# Patient Record
Sex: Male | Born: 1935
Health system: Southern US, Community
[De-identification: ages and names within clinical notes are randomized; demographics above are authoritative.]

## PROBLEM LIST (undated history)

## (undated) DIAGNOSIS — N183 Chronic kidney disease, stage 3 unspecified: Secondary | ICD-10-CM

## (undated) DIAGNOSIS — M1990 Unspecified osteoarthritis, unspecified site: Secondary | ICD-10-CM

## (undated) DIAGNOSIS — J42 Unspecified chronic bronchitis: Secondary | ICD-10-CM

## (undated) DIAGNOSIS — I4821 Permanent atrial fibrillation: Secondary | ICD-10-CM

## (undated) DIAGNOSIS — E785 Hyperlipidemia, unspecified: Secondary | ICD-10-CM

## (undated) DIAGNOSIS — K219 Gastro-esophageal reflux disease without esophagitis: Secondary | ICD-10-CM

## (undated) DIAGNOSIS — I509 Heart failure, unspecified: Secondary | ICD-10-CM

## (undated) DIAGNOSIS — I428 Other cardiomyopathies: Secondary | ICD-10-CM

## (undated) DIAGNOSIS — G4733 Obstructive sleep apnea (adult) (pediatric): Secondary | ICD-10-CM

## (undated) DIAGNOSIS — Z9989 Dependence on other enabling machines and devices: Secondary | ICD-10-CM

## (undated) DIAGNOSIS — Z9289 Personal history of other medical treatment: Secondary | ICD-10-CM

## (undated) DIAGNOSIS — I272 Pulmonary hypertension, unspecified: Secondary | ICD-10-CM

## (undated) DIAGNOSIS — I2510 Atherosclerotic heart disease of native coronary artery without angina pectoris: Secondary | ICD-10-CM

## (undated) DIAGNOSIS — K5792 Diverticulitis of intestine, part unspecified, without perforation or abscess without bleeding: Secondary | ICD-10-CM

## (undated) DIAGNOSIS — I10 Essential (primary) hypertension: Secondary | ICD-10-CM

## (undated) DIAGNOSIS — Z950 Presence of cardiac pacemaker: Secondary | ICD-10-CM

## (undated) DIAGNOSIS — G2581 Restless legs syndrome: Secondary | ICD-10-CM

## (undated) DIAGNOSIS — D46Z Other myelodysplastic syndromes: Principal | ICD-10-CM

## (undated) DIAGNOSIS — M109 Gout, unspecified: Secondary | ICD-10-CM

## (undated) DIAGNOSIS — R0600 Dyspnea, unspecified: Secondary | ICD-10-CM

## (undated) DIAGNOSIS — I219 Acute myocardial infarction, unspecified: Secondary | ICD-10-CM

## (undated) HISTORY — PX: INSERT / REPLACE / REMOVE PACEMAKER: SUR710

## (undated) HISTORY — DX: Other cardiomyopathies: I42.8

## (undated) HISTORY — PX: CATARACT EXTRACTION W/ INTRAOCULAR LENS  IMPLANT, BILATERAL: SHX1307

## (undated) HISTORY — DX: Permanent atrial fibrillation: I48.21

## (undated) HISTORY — PX: SHOULDER SURGERY: SHX246

## (undated) HISTORY — DX: Hyperlipidemia, unspecified: E78.5

## (undated) HISTORY — DX: Obstructive sleep apnea (adult) (pediatric): G47.33

## (undated) HISTORY — PX: JOINT REPLACEMENT: SHX530

## (undated) HISTORY — PX: MASS EXCISION: SHX2000

## (undated) HISTORY — DX: Pulmonary hypertension, unspecified: I27.20

## (undated) HISTORY — DX: Dependence on other enabling machines and devices: Z99.89

## (undated) HISTORY — PX: TONSILLECTOMY: SUR1361

## (undated) HISTORY — DX: Other myelodysplastic syndromes: D46.Z

---

## 1999-02-16 ENCOUNTER — Inpatient Hospital Stay (HOSPITAL_COMMUNITY): Admission: RE | Admit: 1999-02-16 | Discharge: 1999-02-20 | Payer: Self-pay | Admitting: Orthopaedic Surgery

## 1999-02-26 ENCOUNTER — Ambulatory Visit (HOSPITAL_COMMUNITY): Admission: RE | Admit: 1999-02-26 | Discharge: 1999-02-26 | Payer: Self-pay | Admitting: Orthopaedic Surgery

## 1999-03-23 ENCOUNTER — Encounter: Admission: RE | Admit: 1999-03-23 | Discharge: 1999-04-24 | Payer: Self-pay | Admitting: Orthopaedic Surgery

## 1999-06-01 ENCOUNTER — Encounter: Payer: Self-pay | Admitting: Emergency Medicine

## 1999-06-01 ENCOUNTER — Inpatient Hospital Stay (HOSPITAL_COMMUNITY): Admission: EM | Admit: 1999-06-01 | Discharge: 1999-06-03 | Payer: Self-pay | Admitting: Emergency Medicine

## 1999-06-01 HISTORY — PX: CORONARY ANGIOPLASTY: SHX604

## 2000-12-07 HISTORY — PX: APPENDECTOMY: SHX54

## 2000-12-07 HISTORY — PX: COLOSTOMY: SHX63

## 2000-12-19 ENCOUNTER — Encounter: Payer: Self-pay | Admitting: Emergency Medicine

## 2000-12-19 ENCOUNTER — Encounter (INDEPENDENT_AMBULATORY_CARE_PROVIDER_SITE_OTHER): Payer: Self-pay | Admitting: Specialist

## 2000-12-20 ENCOUNTER — Inpatient Hospital Stay (HOSPITAL_COMMUNITY): Admission: EM | Admit: 2000-12-20 | Discharge: 2001-01-12 | Payer: Self-pay | Admitting: Emergency Medicine

## 2000-12-20 ENCOUNTER — Encounter: Payer: Self-pay | Admitting: Emergency Medicine

## 2000-12-22 ENCOUNTER — Encounter: Payer: Self-pay | Admitting: General Surgery

## 2000-12-26 ENCOUNTER — Encounter: Payer: Self-pay | Admitting: General Surgery

## 2000-12-28 ENCOUNTER — Encounter: Payer: Self-pay | Admitting: General Surgery

## 2001-01-03 ENCOUNTER — Encounter: Payer: Self-pay | Admitting: General Surgery

## 2001-01-06 ENCOUNTER — Encounter: Payer: Self-pay | Admitting: General Surgery

## 2001-06-19 ENCOUNTER — Ambulatory Visit (HOSPITAL_COMMUNITY): Admission: RE | Admit: 2001-06-19 | Discharge: 2001-06-19 | Payer: Self-pay | Admitting: Gastroenterology

## 2001-06-19 ENCOUNTER — Encounter (INDEPENDENT_AMBULATORY_CARE_PROVIDER_SITE_OTHER): Payer: Self-pay | Admitting: Specialist

## 2001-07-09 HISTORY — PX: COLOSTOMY REVERSAL: SHX5782

## 2001-07-28 ENCOUNTER — Encounter: Payer: Self-pay | Admitting: General Surgery

## 2001-07-31 ENCOUNTER — Encounter (INDEPENDENT_AMBULATORY_CARE_PROVIDER_SITE_OTHER): Payer: Self-pay | Admitting: Specialist

## 2001-07-31 ENCOUNTER — Inpatient Hospital Stay (HOSPITAL_COMMUNITY): Admission: RE | Admit: 2001-07-31 | Discharge: 2001-08-05 | Payer: Self-pay | Admitting: General Surgery

## 2004-06-08 ENCOUNTER — Ambulatory Visit (HOSPITAL_COMMUNITY): Admission: RE | Admit: 2004-06-08 | Discharge: 2004-06-08 | Payer: Self-pay | Admitting: Orthopedic Surgery

## 2004-08-13 ENCOUNTER — Emergency Department (HOSPITAL_COMMUNITY): Admission: EM | Admit: 2004-08-13 | Discharge: 2004-08-13 | Payer: Self-pay | Admitting: Emergency Medicine

## 2004-08-27 ENCOUNTER — Ambulatory Visit (HOSPITAL_BASED_OUTPATIENT_CLINIC_OR_DEPARTMENT_OTHER): Admission: RE | Admit: 2004-08-27 | Discharge: 2004-08-27 | Payer: Self-pay | Admitting: Orthopedic Surgery

## 2004-08-27 ENCOUNTER — Encounter (INDEPENDENT_AMBULATORY_CARE_PROVIDER_SITE_OTHER): Payer: Self-pay | Admitting: *Deleted

## 2004-08-27 ENCOUNTER — Ambulatory Visit (HOSPITAL_COMMUNITY): Admission: RE | Admit: 2004-08-27 | Discharge: 2004-08-27 | Payer: Self-pay | Admitting: Orthopedic Surgery

## 2004-09-09 HISTORY — PX: ROTATOR CUFF REPAIR: SHX139

## 2004-09-18 ENCOUNTER — Inpatient Hospital Stay (HOSPITAL_COMMUNITY): Admission: RE | Admit: 2004-09-18 | Discharge: 2004-09-19 | Payer: Self-pay | Admitting: Orthopedic Surgery

## 2005-01-14 ENCOUNTER — Inpatient Hospital Stay (HOSPITAL_COMMUNITY): Admission: AD | Admit: 2005-01-14 | Discharge: 2005-01-15 | Payer: Self-pay | Admitting: *Deleted

## 2005-12-07 HISTORY — PX: CIRCUMCISION: SUR203

## 2005-12-15 ENCOUNTER — Ambulatory Visit (HOSPITAL_BASED_OUTPATIENT_CLINIC_OR_DEPARTMENT_OTHER): Admission: RE | Admit: 2005-12-15 | Discharge: 2005-12-15 | Payer: Self-pay | Admitting: Urology

## 2006-01-25 ENCOUNTER — Encounter: Admission: RE | Admit: 2006-01-25 | Discharge: 2006-01-25 | Payer: Self-pay | Admitting: Gastroenterology

## 2006-06-26 ENCOUNTER — Inpatient Hospital Stay (HOSPITAL_COMMUNITY): Admission: EM | Admit: 2006-06-26 | Discharge: 2006-06-27 | Payer: Self-pay | Admitting: Emergency Medicine

## 2006-11-08 HISTORY — PX: REPLACEMENT TOTAL KNEE: SUR1224

## 2006-11-30 ENCOUNTER — Inpatient Hospital Stay (HOSPITAL_COMMUNITY): Admission: RE | Admit: 2006-11-30 | Discharge: 2006-12-03 | Payer: Self-pay | Admitting: Orthopedic Surgery

## 2007-01-23 ENCOUNTER — Ambulatory Visit (HOSPITAL_COMMUNITY): Admission: RE | Admit: 2007-01-23 | Discharge: 2007-01-23 | Payer: Self-pay | Admitting: Orthopedic Surgery

## 2007-07-23 ENCOUNTER — Encounter: Admission: RE | Admit: 2007-07-23 | Discharge: 2007-07-23 | Payer: Self-pay | Admitting: Orthopedic Surgery

## 2007-11-24 HISTORY — PX: NM MYOCAR PERF WALL MOTION: HXRAD629

## 2008-11-04 ENCOUNTER — Inpatient Hospital Stay (HOSPITAL_COMMUNITY): Admission: AD | Admit: 2008-11-04 | Discharge: 2008-11-08 | Payer: Self-pay | Admitting: Cardiology

## 2008-11-07 HISTORY — PX: CARDIAC CATHETERIZATION: SHX172

## 2009-10-31 ENCOUNTER — Encounter: Admission: RE | Admit: 2009-10-31 | Discharge: 2009-10-31 | Payer: Self-pay | Admitting: Gastroenterology

## 2009-11-04 ENCOUNTER — Ambulatory Visit (HOSPITAL_COMMUNITY): Admission: RE | Admit: 2009-11-04 | Discharge: 2009-11-04 | Payer: Self-pay | Admitting: Gastroenterology

## 2009-11-07 ENCOUNTER — Ambulatory Visit (HOSPITAL_COMMUNITY): Admission: RE | Admit: 2009-11-07 | Discharge: 2009-11-07 | Payer: Self-pay | Admitting: Gastroenterology

## 2010-11-01 LAB — TYPE AND SCREEN: ABO/RH(D): A NEG

## 2010-11-01 LAB — PROTIME-INR: INR: 3.6 — ABNORMAL HIGH (ref 0.00–1.49)

## 2010-11-18 LAB — POCT I-STAT 3, ART BLOOD GAS (G3+)
pCO2 arterial: 44 mmHg (ref 35.0–45.0)
pH, Arterial: 7.378 (ref 7.350–7.450)
pO2, Arterial: 124 mmHg — ABNORMAL HIGH (ref 80.0–100.0)

## 2010-11-18 LAB — CBC
HCT: 40.2 % (ref 39.0–52.0)
Hemoglobin: 13.9 g/dL (ref 13.0–17.0)
MCHC: 34.5 g/dL (ref 30.0–36.0)
MCV: 95.7 fL (ref 78.0–100.0)
Platelets: 192 10*3/uL (ref 150–400)
RBC: 4.63 MIL/uL (ref 4.22–5.81)
RDW: 14.8 % (ref 11.5–15.5)
WBC: 8.2 10*3/uL (ref 4.0–10.5)

## 2010-11-18 LAB — BASIC METABOLIC PANEL
BUN: 24 mg/dL — ABNORMAL HIGH (ref 6–23)
CO2: 26 mEq/L (ref 19–32)
Calcium: 9.6 mg/dL (ref 8.4–10.5)
Chloride: 108 mEq/L (ref 96–112)
Creatinine, Ser: 1.22 mg/dL (ref 0.4–1.5)
GFR calc Af Amer: >60 mL/min (ref >60)
GFR calc non Af Amer: >60 mL/min (ref >60)
Glucose, Bld: 80 mg/dL (ref 70–99)
Potassium: 4.7 mEq/L (ref 3.5–5.1)
Sodium: 140 mEq/L (ref 135–145)

## 2010-11-18 LAB — POCT I-STAT 3, VENOUS BLOOD GAS (G3P V)
pCO2, Ven: 53.9 mmHg — ABNORMAL HIGH (ref 45.0–50.0)
pH, Ven: 7.271 (ref 7.250–7.300)

## 2010-11-18 LAB — PROTIME-INR: Prothrombin Time: 20.3 seconds — ABNORMAL HIGH (ref 11.6–15.2)

## 2010-11-18 LAB — HEPARIN LEVEL (UNFRACTIONATED): Heparin Unfractionated: 0.1 IU/mL — ABNORMAL LOW (ref 0.30–0.70)

## 2010-11-19 LAB — PROTIME-INR
INR: 2.3 — ABNORMAL HIGH (ref 0.00–1.49)
Prothrombin Time: 26.7 seconds — ABNORMAL HIGH (ref 11.6–15.2)

## 2010-11-19 LAB — COMPREHENSIVE METABOLIC PANEL
ALT: 22 U/L (ref 0–53)
AST: 27 U/L (ref 0–37)
CO2: 24 mEq/L (ref 19–32)
Chloride: 107 mEq/L (ref 96–112)
Creatinine, Ser: 0.94 mg/dL (ref 0.4–1.5)
GFR calc Af Amer: >60 mL/min (ref >60)
GFR calc non Af Amer: >60 mL/min (ref >60)
Glucose, Bld: 127 mg/dL — ABNORMAL HIGH (ref 70–99)
Sodium: 140 mEq/L (ref 135–145)
Total Bilirubin: 0.6 mg/dL (ref 0.3–1.2)

## 2010-11-19 LAB — CK TOTAL AND CKMB (NOT AT ARMC)
CK, MB: 3.9 ng/mL (ref 0.3–4.0)
Relative Index: 2.2 (ref 0.0–2.5)
Total CK: 158 U/L (ref 7–232)

## 2010-11-19 LAB — BASIC METABOLIC PANEL
BUN: 17 mg/dL (ref 6–23)
Calcium: 9.2 mg/dL (ref 8.4–10.5)
Calcium: 9.4 mg/dL (ref 8.4–10.5)
Chloride: 101 mEq/L (ref 96–112)
Creatinine, Ser: 0.87 mg/dL (ref 0.4–1.5)
Creatinine, Ser: 1 mg/dL (ref 0.4–1.5)
GFR calc Af Amer: >60 mL/min (ref >60)
GFR calc non Af Amer: >60 mL/min (ref >60)
Glucose, Bld: 90 mg/dL (ref 70–99)

## 2010-11-19 LAB — CBC
HCT: 39.1 % (ref 39.0–52.0)
HCT: 40 % (ref 39.0–52.0)
MCHC: 34.1 g/dL (ref 30.0–36.0)
MCV: 94.9 fL (ref 78.0–100.0)
Platelets: 168 10*3/uL (ref 150–400)
Platelets: 182 10*3/uL (ref 150–400)
RDW: 14.2 % (ref 11.5–15.5)
WBC: 6 10*3/uL (ref 4.0–10.5)

## 2010-11-19 LAB — DIFFERENTIAL
Basophils Absolute: 0 10*3/uL (ref 0.0–0.1)
Eosinophils Absolute: 0.1 10*3/uL (ref 0.0–0.7)
Eosinophils Relative: 2 % (ref 0–5)
Neutrophils Relative %: 58 % (ref 43–77)

## 2010-11-19 LAB — LIPID PANEL
HDL: 37 mg/dL — ABNORMAL LOW (ref >39)
LDL Cholesterol: 111 mg/dL — ABNORMAL HIGH (ref 0–99)
Total CHOL/HDL Ratio: 4.7 RATIO
Triglycerides: 126 mg/dL (ref <150)
VLDL: 25 mg/dL (ref 0–40)

## 2010-11-19 LAB — TROPONIN I: Troponin I: <0.01 ng/mL (ref 0.00–0.06)

## 2010-11-19 LAB — D-DIMER, QUANTITATIVE: D-Dimer, Quant: <0.22 ug/mL-FEU (ref 0.00–0.48)

## 2010-12-22 NOTE — Discharge Summary (Signed)
[name], [name]               ACCOUNT NO.:  [id number]   MEDICAL RECORD NO.:  [id number]          PATIENT TYPE:  INP   LOCATION:  1531                         FACILITY:  [facility]   PHYSICIAN:  Ollen Gross, M.D.    DATE OF BIRTH:  09/24/1931   DATE OF ADMISSION:  11/30/2006  DATE OF DISCHARGE:  12/03/2006                               DISCHARGE SUMMARY   ADMITTING DIAGNOSES:  1. Osteoarthritis, right knee.  2. Paroxysmal atrial fibrillation.  3. Hypercholesterolemia.  4. Coronary arterial disease.  5. History of myocardial infarction.  6. History of angina.  7. Diverticulosis.  8. Benign prostatic hypertrophy.  9. History of urinary tract infection.  10.History of elevated PSA.   DISCHARGE DIAGNOSES:  1. Osteoarthritis, right knee, status post right total knee      arthroplasty.  2. Mild postoperative blood loss anemia.  3. Mild postoperative hyponatremia, improved.  4. Paroxysmal atrial fibrillation.  5. Hypercholesterolemia.  6. Coronary arterial disease.  7. History of myocardial infarction.  8. History of angina.  9. Diverticulosis.  10.Benign prostatic hypertrophy.  11.History of urinary tract infection.  12.History of elevated PSA.   PROCEDURE:  November 30, 2006, right total knee, surgeon -- Dr. Lequita Halt,  assistant -- Avel Peace PA-C, spinal anesthesia.   CONSULTS:  None.   BRIEF HISTORY:  [name] is a 77 year old male with a history of  osteoarthritis of the right knee with intractable pain who has failed  nonoperative management and now presents for total knee arthroplasty.   LABORATORY DATA:  Preop CBC showed hemoglobin of 13.6, hematocrit 39.6,  white cell count 6.5; postop hemoglobin 11.4 and drifted down to 10.4;  last noted H&H 10.0 and 29.2.  PT/PTT on admission were 13.4 and 26,  respectively, INR 1.0.  Serial pro times were followed, last noted  PT/INR 17.5 and 1.4.  Chemistry panel on admission all within normal  limits.  Serial BMETs were  followed; sodium did drop from 139 to 131 and  back up to 136.  Preop UA negative.  Blood group and type A negative.   Pelvis films, December 02, 2006:  No acute bony abnormalities, lower lumbar  spondylosis.   EKG, June 25, 2006:  Limb lead reversal, otherwise no significant  change, confirmed by Dr. Carleene Cooper,   HOSPITAL COURSE:  The patient was admitted to [facility] to  undergo a right total knee arthroplasty and also underwent a left  shoulder subacromial injection at the same time, tolerated the procedure  well, later transferred to the recovery room and orthopedic floor, given  Coumadin for DVT prophylaxis, started on PCA and p.o. analgesic for pain  control following surgery.  He started getting up out of bed on day #1.  He had a little bit of discomfort in the right thigh, but otherwise  doing pretty well.  By day #2, he had a little bit of discomfort still  in the leg and had some of groin and thigh pain, also some calf  soreness.  We did x-rays of his pelvis and also ordered Dopplers; I do  not see  the Doppler report, we have it documented in the notes that the  Doppler proved to be negative.  Although despite the pain, he initially  did well with his physical therapy.  On day #2, he was up ambulating  approximately 60 feet with his rolling walker.  Dressing was changed;  incision looked good, no signs of infection.  He was weaned off his PCA  over to p.o. medications, continued to progress with therapy and by day  #3, was up and ambulating approximately 120 feet with a rolling walker.  He was tolerating his medications and was discharged home later on December 03, 2006.   DISCHARGE PLAN:  1. The patient was discharged home on December 03, 2006.  2. Discharge diagnoses:  please see above.  3. Discharge medications:  Percocet, Robaxin, Coumadin and Trinsicon.      Recommend over-the-counter laxative of choice and stool softeners.  4. Diet:  Resume home diet.  5.  Activity:  Weightbearing as tolerated, right lower extremity.  Home      health PT and home health nursing, total knee protocol.   DISPOSITION:  Home.   CONDITION ON DISCHARGE:  Improved.      Alexzandrew L. Perkins, P.A.C.      Ollen Gross, M.D.  Electronically Signed    ALP/MEDQ  D:  01/06/2007  T:  01/07/2007  Job:  [id number]   cc:   Thereasa Solo. Little, M.D.  Fax: [phone]   Maretta Bees. Vonita Moss, M.D.  Fax: [phone]

## 2010-12-22 NOTE — Op Note (Signed)
[name], [name]               ACCOUNT NO.:  [id number]   MEDICAL RECORD NO.:  [id number]          PATIENT TYPE:  AMB   LOCATION:  DAY                          FACILITY:  [facility]   PHYSICIAN:  Ollen Gross, M.D.    DATE OF BIRTH:  1932/06/13   DATE OF PROCEDURE:  01/23/2007  DATE OF DISCHARGE:                               OPERATIVE REPORT   PREOPERATIVE DIAGNOSIS:  Arthrofibrosis, right knee.   POSTOPERATIVE DIAGNOSIS:  Arthrofibrosis, right knee.   PROCEDURE:  Right knee closed manipulation.   SURGEON:  Ollen Gross, M.D., no assistant.   ANESTHESIA:  General.   Pre-manipulation range of motion 10-85.  Post-manipulation range of  motion 5-120.   COMPLICATIONS:  None.   CONDITION:  Stable to recovery.   BRIEF CLINICAL NOTE:  [name] is a 77 year old male who had a right  total knee arthroplasty done approximately 6-8 weeks ago.  He has not  progressed very well with physical therapy and has plateaued with  flexion of 85 degrees and with a 15-degree flexion contracture.  He  presents now for closed manipulation.   PROCEDURE IN DETAIL:  After the successful administration of a general  anesthetic.  Exam under anesthesia was performed demonstrating range of  motion 10-85 degrees.  I then placed my chest on his proximal tibia,  flexing the knee with audible lysis of adhesions.  I was able to get him  flexed to 120 degrees and extended to within 5 degrees of full  extension.  I worked on patellar mobility and got the patella moving  better.  He was subsequently awakened and transported to recovery in  stable condition.      Ollen Gross, M.D.  Electronically Signed     FA/MEDQ  D:  01/23/2007  T:  01/24/2007  Job:  [id number]

## 2010-12-22 NOTE — Cardiovascular Report (Signed)
NAMEBRADFORD, [name]               ACCOUNT NO.:  [id number]   MEDICAL RECORD NO.:  [id number]          PATIENT TYPE:  INP   LOCATION:  3710                         FACILITY:  [facility]   PHYSICIAN:  Thereasa Solo. Little, M.D. DATE OF BIRTH:  [birth year]   DATE OF PROCEDURE:  11/07/2008  DATE OF DISCHARGE:                            CARDIAC CATHETERIZATION   INDICATIONS FOR TEST:  This 77 year old male has chronic atrial  fibrillation.  He has refused cardioversion and any significant workup  in the recent past but presented to my office on November 04, 2008,  complaining of worsening shortness of breath and episodes of chest pain.  He was hospitalized and his Coumadin was stopped.  He remained in atrial  fibrillation.  His INR is now down to 1.70 and he is brought to the cath  lab.  In the interim, he had pulmonary function studies that showed an  FEV-1 of 65%.  He was given nebulizer treatments which seemed to make a  substantial improvement in his breathing.  He has symptoms of  obstructive sleep apnea and I tried to get an oxygen saturation run at  night in the hospital but respiratory therapy says their equipment is  malfunctioning, this cannot be performed.   PROCEDURE:  The patient was prepped and draped in the usual sterile  fashion exposing the right groin following local anesthetic with 1%  Xylocaine, a Seldinger technique employed and a 5-French introducer  sheath was placed in the right femoral artery and a 7-French introducer  sheath in the right femoral vein.  A Swan-Ganz catheter was then  advanced through its normal route into the pulmonary artery with  hemodynamic monitoring undertaken throughout each station.  Cardiac  output by thermodilution was then performed.  A pigtail catheter was  then placed into the LV cavity for ventriculography and simultaneous  hemodynamic monitoring in the LV and pulmonary artery.  Left and right  coronary arteriography were performed and cardiac  output by  thermodilution was performed.   COMPLICATIONS:  None.   The patient was restless.  He complained of his back hurting him.  He  was given 2 mg of Versed and 25 mg of fentanyl.  With this, he slept  through the majority of the procedure, but had uncontrollable leg  movement.  This was so significant that one tech had to hold his of  right leg down the entire case.  The leg movement was exacerbated each  time he had these long apneic spells that required [place] to shake or  stimulate him.  I suspect there is significant obstructive sleep apnea  and he will need this evaluated as an outpatient.   TOTAL CONTRAST:  75 mL.   EQUIPMENT:  5-French Judkins configuration catheters.   RESULTS:  Ventriculography.  Ventriculography in the RAO projection  revealed slight dilatation of left ventricular cavity.  The ejection  fraction was 40-45%.  He was in atrial fibrillation.  His left  ventricular end-diastolic pressure was 19.   CORONARY ARTERIOGRAPHY:  1. Left main was normal and bifurcated.  2. Circumflex:  The circumflex was basically  a hybrid system.  It gave      rise to a very large ongoing circumflex.  It gave rise to a      marginal vessel at the terminal portion of this.  There were 2      medium-sized OMs that came off very proximal.  These vessels were      free of disease.  3. LAD:  The LAD extended down to the apex of the heart.  It was free      of disease.  The first diagonal was free of disease.  4. Right coronary artery:  The right coronary was a large dominant      vessel.  There was no high-grade stenosis.  There was no narrowing      in the right coronary artery.  The PDA was large, free of disease      as was posterolateral vessel.   HEMODYNAMIC MONITORING:  1. Right atrial pressure 11, right ventricular pressure 42/11,      pulmonary artery pressure 43/21, wedge 21, central aortic pressure      131/64, left ventricular pressure 128/13 with no significant  valve      gradient noted at the time of pullback.  2. Oxygen saturation on 2 L 100% in the AO and 57% in the PA.   Cardiac output by thermodilution 4.1, Fick 3.6.  Cardiac index  thermodilution 1.8, Fick 1.6.   ASSESSMENT:  1. Nonischemic cardiomyopathy with ejection fraction of 40-45%.  2. Pulmonary hypertension with pulmonary artery pressure of 43/21.  3. Chronic atrial fibrillation.  4. Moderate-to-severe obstructive sleep apnea with uncontrollable leg      movement.   My plan is to restart him on Coumadin and consider Lovenox as a bridge.  If his insurance will allow and his family can administer this, he can  be discharged to home late today or tomorrow.  Otherwise, he will be  here until his INR is therapeutic.           ______________________________  Thereasa Solo. Little, M.D.     ABL/MEDQ  D:  11/07/2008  T:  11/08/2008  Job:  [id number]   cc:   Catheterization Lab

## 2010-12-22 NOTE — Discharge Summary (Signed)
[name], [name]               ACCOUNT NO.:  [id number]   MEDICAL RECORD NO.:  [id number]          PATIENT TYPE:  INP   LOCATION:  3710                         FACILITY:  [facility]   PHYSICIAN:  Thereasa Solo. Little, M.D. DATE OF BIRTH:  20-Jan-1932   DATE OF ADMISSION:  11/04/2008  DATE OF DISCHARGE:  11/08/2008                               DISCHARGE SUMMARY   DISCHARGE DIAGNOSES:  1. Chest pain, coronary ischemia status post cardiac catheterization      with no significant obstructive coronary artery disease.  2. Shortness of breath, questionable mild congestive heart failure      versus chronic obstructive pulmonary disease, mild versus effects      from diastolic.  3. Chronic atrial fibrillation.  4. Anticoagulation, sent home on Lovenox and Coumadin.  5. Probable obstructive sleep apnea.  6. Hyperlipidemia.   LABORATORY DATA:  Blood gas, November 07, 2008, showed pO2 of 124, bicarb  25, O2 sat 99%.  PH was 7.378, pCO2 was 44.  Hemoglobin 13.9, hematocrit  40.2, WBC 6.8, and platelets 192.  Sodium 140, potassium 4.7, BUN 24,  creatinine 1.04.  CK-MBs and troponins were all negative.  His BNP on  November 04, 2008, was 464.  TSH was 1.994.  Magnesium was 2.0.  Total  cholesterol was 173, triglycerides 126, HDL 37, LDL 111.  INR on November 08, 2008, was 1.5.  D-dimer was less than 0.22.  Chest x-ray on November 04, 2008, showed cardiac enlargement without acute pulmonary process.   Tests include PFTs performed on November 05, 2008.  His FVC was 68%  predicted and FEV-1 was 78% predicted.  It was interpreted as minimal  obstructive airway disease.  Cardiac catheterization on November 07, 2008,  by Dr. [name] revealing no coronary artery .  His EF was mildly  low at 40-45%.  PA pressure was 43/21.  Cardiac output by thermo was 4.1  and cardiac index 1.8.  RV was 42/11.   HOSPITAL COURSE:  [name] came to the hospital, was actually  seen in the office.  He was worked in because of  increased dyspnea on  exertion and chest pain for 3-4 weeks and more over the last 2 weeks.  He had no cough or fever.  He was seen by Dr. Clarene Duke, and he was  admitted to the hospital.  Because of a mildly elevated BNP 464, he  received one dose of 40 mg of p.o. Lasix.  After that, he did show some  improvement; however, his Bystolic was also held.  He was also given  some breathing treatments.  He underwent cardiac catheterization.  This  did not show any significant disease, but he did have mild decrease in  his EF of 40-45%.  He also had elevated PA pressures and some pulmonary  hypertension.  He had been on Coumadin prior to hospitalization.  The  Coumadin had been held for the cath.  He was placed back on Lovenox  after cath, and his daughter who is an Char[employer] fundraiser was able to give him  injections at home, thus he  was discharged home on Lovenox to Coumadin  on November 08, 2008, in stable condition.  He had no further shortness of  breath.   DISCHARGE MEDICATIONS:  1. He was told to told his fish oil until he is off his Lovenox and      Coumadin is therapeutic.  2. Avodart 5 mg a day.  3. Trilipix 135 mg a day.  4. Flomax 0.4 mg a day.  5. Loratadine 10 mg as needed.  6. Benazepril 10 mg a day.  7. Warfarin same dose as taken at home prior.  8. Equate stool softener as needed.  9. Celexa 20 mg a day.  10.MiraLax 17 g daily in 8 ounces a water for constipation.  11.Lovenox 120 mg subcu every 12 hours until INR is therapeutic.  12.Furosemide 20 mg every other day with K-Dur 20 mEq every other day.      He should take this on the same day every other day,  13.Combivent MDI 1 puff 4 times per day.   He will follow up with Dr. Clarene Duke in the office in 1-2 weeks.  Our  office will call him with an appointment.  He was told not to do any  strenuous activity, lifting, pushing, pulling, or exercise for 1 week.  He should have his protime checked on Monday or Tuesday.  He will call  for an  appointment.      Lezlie Octave, N.P.    ______________________________  Thereasa Solo. Little, M.D.    BB/MEDQ  D:  11/08/2008  T:  11/09/2008  Job:  [id number]

## 2010-12-25 NOTE — H&P (Signed)
NAMEMARQUIZE, SEIB               ACCOUNT NO.:  [id number]   MEDICAL RECORD NO.:  [id number]          PATIENT TYPE:  INP   LOCATION:  5029                         FACILITY:  [facility]   PHYSICIAN:  Tera Mater. [place], M.D. DATE OF BIRTH:  12/05/1931   DATE OF ADMISSION:  06/25/2006  DATE OF DISCHARGE:                                HISTORY & PHYSICAL   HISTORY OF PRESENT ILLNESS:  Mr. Platte is a 77 year old white male with a  history coronary disease, extensive diverticular rupture in the past, and  hypertension who presents for evaluation.  He was in his usual state of good  health with no active complaints and awakened from a nap yesterday with  acute upper and mid abdominal pain.  He was quite uncomfortable, came to the  emergency room for evaluation.  He had no episodes of nausea and vomiting.  At that time, he had no diarrhea or bowel movements in the last 24 hours.  He has been given pain medicines and his pain is dramatically better.  His  bowels have been working normal and he has seen no blood there.  He did,  however, have a bloody stool while here in the emergency room.  He has had  no unusual food intake or travel.  He has had no chronic GI complaints and  has been doing quite well.  His weight has been clinically stable with 2-3  pounds of increase at most.  He has had no cardiac symptoms, no shortness of  breath, his right knee chronically hurts.  He has had no similar episodes in  recent memory, although it does look like, in the computer, that he  presented in June 2006, he had a somewhat similar episode.  Nobody else in  the family has been sick.  He has had no fevers, chills, or sweats.   PAST MEDICAL HISTORY:  Includes ruptured diverticulum in 2002 with extensive  abscess formation, wound problems, and a colostomy and Hartmann pouch that  was reversed.  He has had a history of colonic polyps, history of heart  disease with an MI in 2000, history of paroxysmal A-fib on  chronic Coumadin  therapy.  He has had an elevated PSA and is followed by Dr. Vonita Moss,  hyperlipidemia, hypertension, chronic proteinuria.   PAST SURGICAL HISTORY:  Includes a palmar mass repair with ulnar nerve  repair, rotator cuff tear repair, and circumcision.  He has also had a left  total knee replacement.   ALLERGIES:  1. Are to [facility], which makes him nauseous.  2. SULFA.  3. NIASPAN.   SOCIAL HISTORY:  He is married, he has not smoked in some time.   CURRENT MEDICATION LIST:  Includes Warfarin, Darvocet, Zocor, Zetia,  Avodart, and Celebrex.   PHYSICAL EXAMINATION:  Currently on exam, we have a heavy, but non toxic  appearing, white male lying quietly on a stretcher, no stress.  Blood  pressure 103/75, pulse 55, respirations 16, temperature 97.1, O2 SAT's 95%  on room air.  HEENT:  Sclera anicteric, extraocular movements intact, no nystagmus.  Oral  mucous membranes are  moist.  Face is symmetric.  NECK:  Is supple without bruits or JVD.  LUNGS:  Are clear without wheezes, rales, or rhonchi.  No accessory muscle  in use.  HEART:  Is distant and slightly irregular.  ABDOMEN:  Is large, difficult to assess, it is moderately distended,  multiple old scars are present, it is somewhat tympanic with some high  pitched sounds but really no rushes or tinkles.  He is really non tender at  the present time.  EXTREMITIES:  Show real trace of edema, pulses are relatively preserved.  NEURO:  The patient is awake, alert, maintaining perfectly well, speech is  clear, no tremors present.  Motor exam is normal.   LABORATORY DATA:  Abdominal x-ray suggests a significant small bowel  obstruction, white count is 9800, hemoglobin is 15.2, platelets 246,000, INR  is 2.3, sodium is 137, potassium 4.0, chloride 106, CO2 22, BUN 16,  creatinine 1.1.  An EKG is right axis.  He seems to be in a sinus rhythm  with incomplete right bundle branch block.  No ischemic changes are present.    ASSESSMENT/PLAN:  In summary, we have a 77 year old white male with  extensive GI surgery in the past, presenting with acute abdominal pain and  an x-ray consistent with small bowel obstruction.  It is a bit unusual that  he had a bloody stool while here.  That is not consistent with the other  data.  At the present time, he is hemodynamically stable, not significantly  febrile, and he is pain free at the moment.  We are going to bring him in,  give him some fluids, keep him at bowel rest, have GI see him and do further  evaluation, such as a CT, if clinically required.  His extensive GI history  does make mechanical obstruction more likely than in some cases.  The GI  bleeding may be unrelated.  His INR is therapeutic.  He has no evidence of  current cardiac dysfunction.           ______________________________  Tera Mater Evlyn Kanner, M.D.     SAS/MEDQ  D:  06/26/2006  T:  06/26/2006  Job:  [id number]

## 2010-12-25 NOTE — Consult Note (Signed)
[facility]  Patient:    [name], [name]                      MRN: [id number] Proc. Date: 01/09/01 Adm. Date:  [id number] Attending:  Brandy Hale CC:         Jonelle Sports. Cheryll Cockayne, M.D.  [name], M.D.  [name], M.D.  Angelia Mould. Derrell Lolling, M.D.   Consultation Report  HISTORY OF PRESENT ILLNESS:  The patient admitted with perforated diverticulitis who was managed initially medially, but when he failed to improve underwent surgical options on the 22nd.  He has improved nicely from that and his diet has been advanced; however, his liver tests have been increasing slowly and I am consulted for further workup and plans.  An ultrasound done did show a marked amount of sludge with slight gallbladder distention but the CBD was normal.  There were no reported abnormalities of the gallbladder on any of his previous CT scans.  The patient has had no history of elevated liver tests or liver or gallbladder problems before.  PAST MEDICAL HISTORY:  MI and coronary artery disease, chronic proteinuria, microscopic hematuria, hypertension, left knee replacement, tonsillectomy, and the recent abdominal surgery as mentioned above.  HOME MEDICATIONS:  1. Lopressor.  2. Lasix.  3. Nitroglycerin.  4. Lipitor.  5. Aspirin.  6. Pepcid.  HOSPITAL MEDICATIONS:  1. Phenergan.  2. Digoxin.  3. Mylanta.  4. Fluconazole.  5. Primaxin.  6. Lovenox.  7. Protonix.  8. Flomax.  9. Lopressor. 10. Demerol. 11. Insulin. 12. Benadryl. 13. ______.  FAMILY HISTORY:  Negative for any liver disease or gallbladder problems.  SOCIAL HISTORY:  Rarely drinks.  Quit smoking about 40 years ago.  REVIEW OF SYSTEMS:  Pertinent for him eating well, feeling better, wanting to go home.  PHYSICAL EXAMINATION:  GENERAL:  No acute distress.  Sitting comfortably in the chair eating.  VITAL SIGNS:  Stable.  Afebrile.  LABORATORY DATA:  Ultrasound report  reviewed.  Pertinent for the sludge but normal CBD.  CT reports reviewed as well.  Liver tests were pertinent for normal liver tests on admission, which beginning on the 30th have increased beginning with AST.  Bilirubin has increased to 3.8 but has actually been dropping since then.  ALT 178, ALP 244.  The alkaline phosphatase has increased to 569, whereas the other transaminases have stabilized in the 120-140 range.  His TPN was stopped recently, I believe yesterday.  ASSESSMENT: 1. Status post surgery for ruptured diverticula. 2. Increased liver tests with sludge in the gallbladder.  PLAN:  With the patient looking and feeling so well, afebrile and normal white count, I do not think the gallbladder bowel duct is playing a role.  I think the LFTs are probably multifactorial secondary to the TPN and antibiotics, etc.  Clinical situation:  I have discussed the above with Dr. Derrell Lolling.  We might consider HIDA scan, or even an MRCP, but would hold ERCP or liver biopsy for now, since stopping the antibiotics tomorrow.  I think following him clinically a few more days before we decide is okay.  We will follow with you. DD:  01/09/01 TD:  01/09/01 Job: [id number]

## 2010-12-25 NOTE — Op Note (Signed)
[place]. [facility]  Patient:    [name], [name] Visit Number: [id number] MRN: [id number]          Service Type: SUR Location: MICU 2107 01 Attending Physician:  Brandy Hale Dictated by:   Angelia Mould. Derrell Lolling, M.D. Proc. Date: 07/31/01 Admit Date:  07/31/2001   CC:         [name], M.D.  [name], M.D.  [name], M.D.  [name], M.D.   Operative Report  PREOPERATIVE DIAGNOSIS:  Diverticulitis, status post Hartmann resection.  POSTOPERATIVE DIAGNOSIS:  Diverticulitis, status post Hartmann resection.  PROCEDURE:  Exploratory laparotomy, resection and closure of colostomy.  SURGEON:  Angelia Mould. Derrell Lolling, M.D.  FIRST ASSISTANT:  Sandria Bales. Ezzard Standing, M.D.  OPERATIVE INDICATION:  This is a 77 year old white male who presented to this hospital on Dec 28, 2000, for complicated, ruptured acute diverticulitis.  He had multiple interloop abscesses with intense inflammation.  He underwent sigmoid colectomy with colostomy, a segmental small bowel resection, and appendectomy at that time.  Postop wound infection occurred but the healed. He had elevated liver function tests postop, possibly cholestatic changes form his hyperalimentation, and this has now resolved.  He has undergone outpatient workup and has undergone bowel prep at home and is here electively for colostomy closure.  DESCRIPTION OF PROCEDURE:  Following the induction of general endotracheal anesthesia, an orogastric tube and a Foley catheter were inserted without difficulty.  The patient was placed in the lithotomy position.  Rigid proctoscopy was carried out to about 16 or 17 cm.  He had mild friability of the rectal mucosa because of his diversion but otherwise no abnormality.  The patient was then placed supine and the abdomen was prepped and draped in a sterile fashion.  Midline laparotomy incision was made, excising the old wide scar.  Dissection was  carried down through the subcutaneous tissue and through the fascia.  We entered the abdomen in the upper abdomen and slowly opened the incision, taking down all of the adhesions atraumatically.  We dissected the omentum away from the underside of the incision, especially on the right side, and elevated the omentum and the transverse colon.  We took all the adhesions down of the small bowel.  There was no abscess or purulence anywhere, just old adhesions, and we examined the small bowel all the way from the ligament of Treitz to the ileocecal valve, also examined the ascending colon, transverse colon, splenic flexure, and descending colon down to the colostomy, and it all looked fine.  The rectal stump was mobilized out of the pelvis, and we had a reasonably good length of about 6 or 7 cm above the peritoneal reflection.  We identified the Prolene sutures in the rectal stump, elevated this up, cleaned some of the fat off, placed a Satinsky clamp on the stump of the rectum, and transected this, giving a clean, open rectal stump to sew to.  Stay sutures of silk were placed below this to keep it elevated.  We finished taking down all the adhesions.  We resected the colostomy from the left abdominal wall and brought it out.  We took down the splenic flexure partially by dividing the lateral peritoneal attachments, and this enabled [place] to bring the colon down into the true pelvis without any tension.  We were able to create an anastomosis which ultimately lay right at the sacral promontory.  We resected about 4 cm of the colostomy, taking down the mesentery  between hemostats and tying off mesenteric vessels with 2-0 silk ties.  We then placed an Allen clamp on the colon about 4-5 cm above the colostomy and transected this and sent the resected colostomy and colon segment to pathology.  This colon was pink and healthy and quite viable.  Anastomosis was created between the proximal colon, which  was the very distal descending colon, and the proximal rectum with interrupted single layer of 3-0 silk.  Corner sutures were placed and held in place.  Posterior wall was closed with a mattress suture centrally and interrupted simple sutures all the way across, and then these sutures were then tied and cut.  The corners were turned in with interrupted inverting sutures of 3-0 silk.  The anterior wall was then closed with interrupted sutures of 3-0 silk.  This provided a very secure closure with good, pink, viable bowel which bled easily on either side.  At this point we changed our gloves and instruments and suction devices.  We irrigated the abdomen and pelvis with about 4 L of saline.  We found no bleeding.  We closed a little bit of the mesentery of the colon at the anastomosis to cover up the suture line.  There was no tension on the anastomosis, and it lay quite nicely just over the sacral promontory.  We closed the fascia at the left-sided colostomy site with about six interrupted sutures of #1 Novofil.  The midline fascia was closed with running sutures of #1 Novofil, and also we placed about six or seven interrupted sutures of #1 Novofil to reinforce this.  After all these sutures were placed, we irrigated the midline wound and the colostomy site and closed the incisions with skin staples.  The colostomy site was closed fairly loosely, and we placed Telfa wicks in place.  Clean bandages were placed and the patient taken to the recovery room in stable condition.  Estimated blood loss was about 250 cc.  Complications:  None.  Sponge, needle, and instrument counts were correct. Dictated by:   Angelia Mould. Derrell Lolling, M.D. Attending Physician:  Brandy Hale DD:  07/31/01 TD:  08/01/01 Job: [phone] [id number]

## 2010-12-25 NOTE — H&P (Signed)
[place]. [facility]  Patient:    [name], [name]                      MRN: [id number] Adm. Date:  [id number] Attending:  Brandy Hale CC:         Jonelle Sports. Cheryll Cockayne, M.D.  Thereasa Solo. Little, M.D.  [name], M.D.   History and Physical  CHIEF COMPLAINT: Lower abdominal pain.  HISTORY OF PRESENT ILLNESS: This is a 77 year old white male, who noted rather sudden onset of diffuse lower abdominal pain at about 5:30 a.m. yesterday, Monday, Dec 19, 2000.  He denies fever or chills.  He denies nausea or vomiting.  He states he had a couple of reasonably formed bowel movements yesterday, no diarrhea.  He has seen no blood in his bowel movements.  He states he is voiding reasonably well.  He denies any prior GI symptoms like this.  He specifically denies prior history of appendicitis, ulcer disease, or diverticulitis.  He was evaluated by the emergency department physician and I was called to see him when CT scan suggested diverticulitis with micro perforation.  PAST MEDICAL/SURGICAL HISTORY:  1. The patient had a myocardial infarction in September 2000 and underwent     cardiac catheterization and stenting by Dr. Clarene Duke.  2. He has chronic proteinuria, followed by Dr. Camille Bal.  3. He has microscopic hematuria, followed by Dr. Larey Dresser.  4. He has borderline hypertension.  5. He has had a left total knee replacement in July 2000.  6. He has had a tonsillectomy.  CURRENT MEDICATIONS:  1. Lopressor q.d, dose unknown.  2. Lasix 40 mg q.d.  3. Pepcid q.d.  4. Isosorbide 10 mg q.d.  5. Aspirin one q.d.  6. Aleve.  7. Lipitor.  ALLERGIES: VICODIN.  SOCIAL HISTORY: The patient is retired.  He is married and has five children. 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  He drinks alcohol rarely and states he quit smoking cigarettes about 40 years ago.  FAMILY HISTORY: Father died of a stroke.  Mother died of unknown cause.   One brother has diabetes.  REVIEW OF SYSTEMS: All systems are reviewed and are unremarkable except as described above.  PHYSICAL EXAMINATION:  GENERAL: Overweight white gentleman, in moderate distress.  He is cooperative and friendly.  VITAL SIGNS: Oxygen saturation 92% on room air.  Blood pressure 136/59, heart rate 79 and regular, respiratory rate 24, temperature 98.6 degrees.  HEENT: Sclerae clear.  EOMI.  Oropharynx clear.  He has upper and lower dentures.  NECK: Supple, nontender; no masses, no thyromegaly, no adenopathy, no bruits.  LUNGS: Clear to auscultation.  HEART: Regular rate and rhythm, no murmur.  ABDOMEN: Obese, bowel sounds diminished; diffuse tenderness and guarding, but this is most noticeable in the bilateral lower quadrants.  No palpable mass. He is significantly tender, however.  GU: Normal male genitalia pattern.  Penis, scrotum, and testes look normal.  EXTREMITIES: No edema, good pulses.  NEUROLOGIC: Grossly within normal limits.  LABORATORY DATA: Admission WBC 15,400.  Urinalysis shows 5-10 rbc/hpf. Complete metabolic panel normal.  EKG pending.  CT scan shows what appears to be a thickened sigmoid colon with numerous diverticula and surrounding inflammatory changes in the mesentery; few tiny air bubbles suggesting micro perforation; no abscess; no large amount of free air.  IMPRESSION:  1. Acute diverticulitis with micro perforation.  CT scan findings show a     fairly localized process because  abdominal examination is more worrisome.  2. Coronary artery disease, status post myocardial infarction; stable.  3. Hypertension.  PLAN: The patient will be admitted for initial nonoperative management and will be kept NPO and placed on IV Zosyn.  He is advised we are going to attempt nonoperative management but that if he deteriorates he will possibly need exploratory laparotomy and colectomy and colostomy to rectify the situation.  If he responds  to antibiotics then he can have an elective work-up later. DD:  12/20/00 TD:  12/20/00 Job: [id number]

## 2010-12-25 NOTE — Consult Note (Signed)
[name], [name] NO.:  [id number]   MEDICAL RECORD NO.:  [id number]          PATIENT TYPE:  OBV   LOCATION:  0450                         FACILITY:  [facility]   PHYSICIAN:  [name]., M.D.DATE OF BIRTH:  July 11, 1932   DATE OF CONSULTATION:  09/19/2004  DATE OF DISCHARGE:                                   CONSULTATION   REFERRING PHYSICIAN:  Dr. Homero Fellers [name]   REASON FOR CONSULTATION:  Difficulty voiding after shoulder operation.   BRIEF HISTORY:  This 77 year old patient has been seeing Dr. Vonita [name] off an  on for many years.  He has obstructive uropathy and after most of his  operations, has difficulty voiding, as he has with this time.  He had a  right shoulder operation on September 18, 2004, and had to be in-and-out  catheterized a couple of times.  The nurses called me, and I authorized some  Flomax that he takes when he has difficulty.  He did take this and has been  able to void on his own several times since then.   MEDICINES:  1.  Avodart 0.5 mg daily.  2.  Propoxyphene for pain.  3.  Vytorin.  4.  Warfarin which was discontinued a couple of days before surgery.   ALLERGIES:  None, except that VICODIN causes itching and nausea.   PAST MEDICAL HISTORY:  1.  He had a mild heart attack in October 2000.  Dr. Caprice [name] is his      cardiologist.  An angioplasty has repaired his damaged blood vessels.  2.  Has history of atrial fibrillation for which he is on the Coumadin.  3.  He also had a colostomy by Dr. Derrell [name] for diverticulitis in May 2002 and      then had this reversed in January 2003.  4.  He had surgery on his left hand, August 27, 2004, by Dr. Merlyn [name] as an      outpatient and had left knee replacement by Dr. Ophelia [name] in July 2000.  He      has coronary artery disease, status post angioplasty, had Cardiolite,      December 2005, which was normal.  His atrial fibrillation is controlled      at this time and paroxysmal in nature.  He  has mild hypertension.  He      denies reflux or GERD.   PAST MEDICAL HISTORY SOCIAL HISTORY AND REVIEW OF SYSTEMS:  He has two  living brothers; one has diabetes and severe arthritis and the other has  diabetes and arthritis as well.  Has five children, eight grandchildren,  both parents deceased, his father of heart attack and mother of spinal  meningitis.   The patient has been on Avodart per Dr. Vonita [name] and is due to see him again  in May; he sees him about every 6 months.  He does well but often has to  take alpha blockers in addition when he has operative procedures or he has  any immobilization.  He is fairly active and actually fell off a roof and is  what caused  his shoulder injury.   PHYSICAL EXAMINATION:  VITAL SIGNS:  His temperature is afebrile, blood  pressure 138/64, pulse 59, respirations 18.  GENERAL:  He is a pleasant, elderly, balding white male in no acute  distress.  His left hand is in a splint.  His right arm is in a shoulder.  HEENT:  Clear.  NECK:  Supple.  CHEST:  Clear.  ABDOMEN:  Obese and soft without masses.  Bladder is not distended.  GU:  Penis normal, circumcised, adequate meatus, bilaterally descended,  somewhat atrophic testes.  RECTAL:  Prostate is moderately enlarged, not fixed or indurated __________  not palpable.  EXTREMITIES:  Previous total knee on the left with some swelling, fairly  good distal pulses, intact sensation to light touch.   IMPRESSION:  1.  Obstructive uropathy doing well on Avodart with supplements of alpha      blockers.  2.  Recent right rotator cuff operation.   Recommend continued Avodart; I gave him a prescription for some Flomax that  he will continue for at least a month and then even longer until he sees Dr.  Vonita [name] in May, and further discussion can be made at that time.      HMK/MEDQ  D:  09/19/2004  T:  09/19/2004  Job:  [id number]

## 2010-12-25 NOTE — Procedures (Signed)
[place]. [facility]  Patient:    [name], [name] Visit Number: [id number] MRN: [id number]          Service Type: END Location: ENDO Attending Physician:  Nelda Marseille Dictated by:   [name], M.D. Proc. Date: 06/19/01 Admit Date:  06/19/2001   CC:         Angelia Mould. Derrell Lolling, M.D.  [name], M.D.  [name], M.D.  Jonelle Sports. Cheryll Cockayne, M.D.   Procedure Report  PROCEDURE:  Colonoscopy via the ostomy and flexible sigmoidoscopy.  INDICATION:  Pre-op.  Consent was signed prior to any premedications given after risks, benefits, methods, and options thoroughly discussed with him and his wife in the past.  MEDICATIONS:  Fentanyl 50 mcg, Versed 5 mg.  DESCRIPTION OF PROCEDURE:  The ostomy appeared normal.  Digital exam was difficult using both the index finger and the pinkie due to an abrupt angle of the ostomy.  However, the pediatric adjustable video colonoscope was inserted and was able to be driven easily around the colon.  This was done using the two-man procedure.  On insertion some left-sided diverticula were seen.  The cecum was identified by the appendiceal orifice and the ileocecal valve.  In fact, the scope was inserted a short way into the terminal ileum, which was normal.  The scope was slowly withdrawn.  The prep was adequate.  There was some liquid stool that required washing and suctioning.  On slow withdrawal, the cecum and the ascending were normal.  There was an occasional transverse diverticulum as well as a left-sided diverticulum.  There was one small polyp on the left side at roughly 20 cm, which was cold biopsied x 1.  He did have some difficulty holding some air, which made the possibility of missing things along the left side difficult, but on slow withdrawal through the ostomy no additional findings were seen.  The patient did experience some pain during the procedure, but there was no obvious immediate  complication.  We then went ahead and rolled him on his side.  Rectal inspection was pertinent for some small external hemorrhoids.  Digital exam was negative.  The same scope was inserted and easily advanced to the end of the Hartmanns pouch.  There might be some mild pouchitis but no significant abnormality.  There was some residual formed stool and some mucus in the colon that could not all be washed and suctioned, but no polypoid lesions or distal diverticula were seen.  We did carefully retroflex the pediatric scope in the rectum, which revealed some tiny internal hemorrhoids.  The scope was straightened, air was suctioned, and the scope removed.  The patient tolerated this part of the procedure adequately.  There was no obvious immediate complication.  ENDOSCOPIC DIAGNOSES: 1. Tiny to small internal-external hemorrhoids. 2. Some mucus and minimal pouchitis. 3. Otherwise within normal limits to the end of the pouch. 4. Via the ostomy, some residual left-sided and one transverse diverticula    seen. 5. One tiny left-sided polyp cold biopsied. 6. Otherwise within normal limits to the cecum and the terminal ileum.  PLAN:  Okay for surgical options.  Await pathology to determine future colonic screening.  Happy to see back p.r.n.  Will go ahead and resume Coumadin today and return care to Dr. Derrell Lolling, Clarene Duke, and Eliott Nine, as well as Dr. Nada Boozer partners to determine any other workup plans, etc. Dictated by:   [name], M.D. Attending Physician:  Nelda Marseille  DD:  06/19/01 TD:  06/19/01 Job: [id number]

## 2010-12-25 NOTE — Consult Note (Signed)
[place]. [facility]  Patient:    [name], [name]                      MRN: [id number] Proc. Date: 01/06/01 Adm. Date:  [id number] Attending:  Brandy Hale CC:         Angelia Mould. Derrell Lolling, M.D.  Jonelle Sports. Cheryll Cockayne, M.D.  Thereasa Solo. Little, M.D.  [name], M.D.   Consultation Report  HISTORY OF PRESENT ILLNESS:  I was asked to see this 77 year old white male who has had increased trouble voiding since he underwent exploratory laparotomy, colectomy, and drainage of intra-abdominal abcesses and appendectomy for diverticulitis.  He has had prostatism and I last saw him in April of 2001 and I did not feel he needed therapy for it at that time.  He has also been worked up in the past for some microhematuria.  He is having increased trouble and straining to void with small voidings.  Fortunately catheterization PVR this morning was only 10 cc.  He has multiple medical problems including hyperlipidemia, GERD, coronary artery disease.  MEDICATIONS: 1. Metoprolol. 2. Isosorbide. 3. Pepcid. 4. Aleve. 5. Aspirin. 6. Furosemide.  PHYSICAL EXAMINATION:  ABDOMEN:  The abdomen has a surgical incision on it.  GENITOURINARY:  Penis, urethral meatus, scrotum, testicles and epididymis without lesions.  Perioneum is normal.  RECTAL:  Anal sphincter tone is normal.  Prostate feels benign and smooth.  IMPRESSION:  Prostatism with a low PVR.  PLAN:  In-and-out catheterization p.r.n. and start Flomax 0.4 mg daily and I will follow up on this gentlemans voiding difficulties during this hospital stay. DD:  01/06/01 TD:  01/06/01 Job: [id number]

## 2010-12-25 NOTE — Op Note (Signed)
[place]. [facility]  Patient:    [name], [name]                      MRN: [id number] Proc. Date: 12/28/00 Adm. Date:  [id number] Attending:  Brandy Hale CC:         Jonelle Sports. Cheryll Cockayne, M.D.  Thereasa Solo. Little, M.D.  [name], M.D.   Operative Report  PREOPERATIVE DIAGNOSIS:  Ruptured sigmoid diverticulitis.  POSTOPERATIVE DIAGNOSIS:  Ruptured sigmoid diverticulitis with multiple intra-abdominal abscesses and extensive inflammatory reaction of the midsmall bowel.  OPERATION PERFORMED:  Exploratory laparotomy with sigmoid colectomy and colostomy, drainage of multiple intra-abdominal abscesses, resection of small bowel with primary anastomosis, appendectomy.  SURGEON:  Angelia Mould. Derrell Lolling, M.D.  ASSISTANT:  Chevis Pretty, M.D.  ANESTHESIA:  INDICATIONS FOR PROCEDURE:  The patient is a 77 year old white male with coronary artery disease and proteinuria and hypertension.  He was admitted on Dec 20, 2000 with a 24-hour history of abdominal pain.  He was found to have tenderness in the lower abdomen and elevated white count to 15,000.  A CT scan showed what looked like sigmoid diverticulitis with a microperforation and a few tiny air bubbles in the area.  He was admitted and placed on intravenous antibiotics in hope that he would respond and undergo elective resection at a later date.  He initially did do well, has low-grade fever and then became afebrile.  His white blood cell count went down but then his white blood cell count became elevated again and today was 21,000 although he was feeling better and having bowel movements and was hungry.  A CT scan then done showed multiple abscesses, especially in the central abdomen between multiple loops of small bowel but no extravasation of contrast.  He was brought to the operating room this evening for urgent laparotomy.  OPERATIVE FINDINGS:  The patient had sigmoid colon diverticulitis  with multiple interloop abscesses in the small bowel and intense inflammatory involvement about two feet of the midsmall bowel probably proximal ileum and inflammatory and infectious involvement of the appendix although the appendix and the cecum and terminal ileum look normal.  The patient did not have acute appendicitis but the appendix was secondarily inflamed.  Careful search was made for [facility] diverticulum and the patient did not have a Meckles diverticulum.  The proximal small bowel looked and felt normal.  The transverse colon and the right colon looked and felt normal.  The mid and distal rectum looked and felt normal.  DESCRIPTION OF PROCEDURE:  Following the induction of general endotracheal anesthesia, the patients abdomen was prepped and draped in sterile fashion. A generous midline laparotomy incision was ultimately required.  The fascia was incised in the midline and the abdominal cavity was entered.  Immediately encountered was omentum and small bowel densely adherent to the anterior abdominal wall which had to be carefully taken down.  Once we gained access both above and below this inflammatory process, we placed self-retaining retractors.  We had to spend at least one hour taking down adhesions and separating loops of acutely inflamed small bowel until we identified the inflammatory process.  We mobilized the right colon and the left colon to identify and dissect out the bowel loops carefully.  The appendix had to be removed.  The appendix was clamped at its base and amputated.  The appendiceal stump was ligated with 2-0 Vicryl tie.  The cecal serosa was closed over the appendiceal stump  with a figure-of-eight suture of 2-0 silk.  About two feet of the proximal ileum had to be resected because it was inflamed and ischemic and did not look healthy.  We transected the proximal ileum, proximal and distal to this inflamed segment with GIA stapling devices. The small bowel  mesentery was taken down close to the bowel wall between Kelly clamps and divided and ligated with 2-0 silk ties and 2-0 silk suture ligatures.  The small bowel specimen was then removed.  We then mobilized the proximal rectum, sigmoid colon and descending colon.  A segment of sigmoid colon approximately 10 inches had to be resected.  The colon was transected proximal and distal to this area with a GIA stapling device.  The sigmoid colon mesentery was mobilized carefully and taken down very close to the bowel wall between clamps adn divided and the specimen removed.  The mesenteric vessels were tied with 2-0 silk ties and 2-0 silk suture ligatures.  After removing the sigmoid colon and the small bowel and the appendix, the abdomen was then copiously irrigated with about 7 or 8L of saline.  We very carefully inspected all areas to make sure there was no bleeding.  We marked the rectal stump with two 2-0 Prolene sutures.  This stump was fairly long at least five inches above the peritoneal reflection.  We then turned our attention to the small bowel anastomosis.  The proximal and distal ends of the small bowel were reanastomosed using a GIA stapling device. The defect left in the bowel wall was closed with a TA-60 stapling device. Several areas of the staple line were reinforced with interrupted inverting sutures of 2-0 silk and 3-0 silk.  We did this to reinforce almost all of the staple lines because of the acute inflammation.  I was concerned about anastomotic leak. The small bowel mesentery was then closed with interrupted figure-of-eight sutures of 2-0 silk.  This anastomosis appeared quite healthy. The bowel was pink and viable but quite edematous.  We mobilized the descending colon and proximal sigmoid colon and we found that we had adequate length to bring through the abdominal wall.  We found a  suitable area of the abdominal wall just above the umbilicus on the left and we had  excised a circular button of skin.  We excised all the subcutaneous fat.  We incised the anterior rectus sheath in a cruciate fashion and divided the rectus muscle.  We then opened up the posterior  rectus sheath generously until we could pass two fingers through this with ease.  We then passed the colon through this and found that it would go easily.  We further irrigated the abdomen.  We inspected for bleeding.  There was no bleeding.  We did not think drains were necessary because the abscesses were mostly between loops of small bowel.  The midline fascia was closed with interrupted sutures of #1 Novofil. Approximately 20 to 25 such sutures were placed.  The subcutaneous tissues were packed open with saline moistened Kerlix.  The colostomy was matured with about 10 interrupted sutures of 3-0 Vicryl.  This was matured to the full thickness of the skin.  The colostomy and colon mucosa was very pink, healthy and bled easily and appeared quite viable.  Clean bandages were placed.  A colostomy bag was placed.  The patient tolerated the procedure well and was taken to the recovery room in stable condition.  Estimated blood loss was about 750 to 1000 cc.  Complications  were none.  Sponge, needle and instrument counts were correct.  The patient was given 3000 cc of IV fluids but no blood. DD:  12/28/00 TD:  12/29/00 Job: [id number]

## 2010-12-25 NOTE — Consult Note (Signed)
[name], [name] NO.:  [id number]   MEDICAL RECORD NO.:  [id number]          PATIENT TYPE:  INP   LOCATION:  5029                         FACILITY:  [facility]   PHYSICIAN:  [name], M.D.    DATE OF BIRTH:  06-10-1932   DATE OF CONSULTATION:  DATE OF DISCHARGE:                                   CONSULTATION   GASTROINTESTINAL CONSULTATION:   HISTORY:  Patient known to me from previous GI workups who was due for  repeat colonoscopy.  Had been doing great from a GI standpoint until  yesterday about 3.  He had lower to periumbilical abdominal pain that kept  increasing.  Finally, when it did not resolve, went to the emergency room.  Was found to have a partial small-bowel obstruction.  Did get a shot of  Dilaudid.  Had 2 bowel movements and is now essentially asymptomatic.  He  had no nausea, vomiting, or fever.  Had gone normally that morning and did  not eat anything different from a normal diet.  He has no other current  complaints.   His past medical history is pertinent for an MI; knee replacement who needs  the other knee replaced; diverticulitis with abscess rupture; ostomy  reversal in 2002, nondiagnostic colonoscopy at that junction; paroxysmal  atrial fibrillation; large prostate followed by Dr. Vonita Moss, has an  appointment on Wednesday; hypertension; and a left inguinal hernia.   Meds at home include Coumadin, Darvocet, Zocor, Zetia, Avodart, and  Celebrex.   ALLERGIES ARE TO VICODIN, SULFA AS WELL.   FAMILY HISTORY:  Negative for any obvious GI problems.   SOCIAL HISTORY:  Used to smoke.  Does not drink.   REVIEW OF SYSTEMS:  Negative except above.  As usual, bowel movements are  fairly frequently after he eats.  Did see blood in the first bowel movement  today.  Has not been seeing much lately, although he does have hemorrhoids.  Did not see blood in the second.   PHYSICAL EXAMINATION:  In no acute distress, lying comfortably in the  bed.  Exam pertinent for his abdomen being soft and nontender, good bowel sounds.   Labs are pertinent for a normal CBC without a left shift.  PT 26; INR 2.3.  Chemistries pertinent for a normal BUN and creatinine, liver test, and  albumin.  Lipase normal.  CT scan report reviewed, pertinent for a partial  SBO with no signs of diverticulitis or other abnormalities.   ASSESSMENT:  1. Multiple medical problems.  2. Resolving small-bowel obstruction, probably adhesions.  3. One-time bright red blood per rectum with normal hemoglobin, on      Coumadin.  4. History of hemorrhoids and no blood in second bowel movement.   PLAN:  Clear liquids okay.  If doing well, can advance diet tomorrow and  hopefully go home soon.  Follow H&H and stools for bleeding.  If pain  recurs, may need surgeon consultation, angiogram, or small bowel follow-  through.  If no signs of more bleeding, it may be safer to allow his bowels  to get back  to normal, then meet regularly for a few weeks and then proceed  with the colonoscopy as an outpatient secondary to not wanting to insufflate  more air currently which may assist with the partial small bowel blockage  returning.  Dr. Evette Cristal to check on tomorrow.  He does have a followup  Wednesday with Dr. Vonita Moss.  Make sure he sees the CT scan for the enlarged  prostate, and he can compare to previous CAT scans, proceed with PSAs,  exams, etc.  I have discussed all of the above with the patient and his wife  who are in agreement.           ______________________________  [name], M.D.     MEM/MEDQ  D:  06/26/2006  T:  06/26/2006  Job:  [id number]   cc:   [name], M.D.  Maretta Bees. Vonita Moss, M.D.

## 2010-12-25 NOTE — Op Note (Signed)
[name], [name]               ACCOUNT NO.:  [id number]   MEDICAL RECORD NO.:  [id number]          PATIENT TYPE:  OBV   LOCATION:  0450                         FACILITY:  [facility]   PHYSICIAN:  Ollen Gross, M.D.    DATE OF BIRTH:  04-25-1932   DATE OF PROCEDURE:  DATE OF DISCHARGE:                                 OPERATIVE REPORT   PREOPERATIVE DIAGNOSES:  1.  Right shoulder impingement.  2.  Acromioclavicular joint arthrosis.  3.  Rotator cuff tear.   POSTOPERATIVE DIAGNOSES:  1.  Right shoulder impingement.  2.  Acromioclavicular joint arthrosis.  3.  Rotator cuff tear.   PROCEDURE:  1.  Right shoulder subacromial decompression.  2.  Distal clavicle resection.  3.  Rotator cuff repair.   SURGEON:  Ollen Gross, M.D.   ASSISTANT:  Alexzandrew L. Julien Girt, P.A.   ANESTHESIA:  Interscalene plus general.   ESTIMATED BLOOD LOSS:  Minimal.   DRAINS:  None.   COMPLICATIONS:  None.   CONDITION:  Stable to recovery.   BRIEF CLINICAL NOTE:  Patient is a 77 year old male. He had a fall off a  ladder approximately 6 weeks ago with immediately shoulder pain and  inability to raise his arm. He has had an MRI scan done a few weeks ago  demonstrating a rotator cuff tear with significant retraction.  We saw him  in the office approximately a week ago and noted significant weakness in the  shoulder associated with this tear.  He presents now for rotator cuff repair  with subacromial decompression and distal clavicle resection.   PROCEDURE IN DETAIL:  After successful administration of interscalene block  and general anesthetic the patient was placed sitting upright in a beach  chair position.  His right upper extremity and shoulder girdle were isolated  from his trunk with plastic drapes and prepped and draped in a sterile  fashion.  Standard incisions made from anterior-to-posterior along the skin  lines at the mid acromial level.  Skin is covered with a 10-blade through  the subcutaneous tissue to the level of the deltoid fascia and then subcu  flaps were elevated circumferentially.   The periosteum over the distal clavicle was split longitudinally and then  that line was taken across the anterior third of the acromion and then the  deltoid is split between its fibers for about 2 cm from the lateral tip of  the acromion.  The entire anterior soft tissue sleeve is subperiosteally  elevated.  Posteriorly it is also elevated over the distal clavicle.  Retractors were then placed to expose the distal clavicle and approximately  1 cm is excised from the distal clavicle.  There was a very stenotic joint.  I removed that piece of bone; and I palpated this resected area, and there  are no other spurs remaining.   I did a subacromial decompression with the oscillating saw creating a flat  undersurface to the acromion.  The bursal tissue was then identified and  excised. He had a massive tear of the supraspinatus, infraspinatus and teres  minor with retraction back to the glenoid.  We mobilized the tendons to the  point where they would easily come over to the greater tuberosity.  Three  Mitek anchors were placed into the greater tuberosity and then we placed the  sutures through the free-edge of the tendon and then advanced the tendon  down into the trough.  I only had to abduct the shoulder to about 10-15  degrees to be able to advance the tendon down to the trough.  We sewed the  tendon down into the trough and then oversewn back through bone to get a  very stable repair.  The tissue was not of the greatest quality, but the  repair was stable and we placed the shoulder through a range of motion and  it was not disrupting.  The area was then thoroughly irrigated and the  deltoid reattached to the acromion through drill holes with Ethibond suture.  The fascia of the distal clavicle is imbricated and the deltoid reattched to  the acromion_through bone; also  the  deltoid fascia is closed.  Subcu is  closed with interrupted 2-0 Vicryl.  Subcuticular running 4-0 Monocryl.  The  incision is clean and dry and Steri-Strips and a bulky sterile dressing  applied.  He is then awakened and transported to recovery in stable  condition.      FA/MEDQ  D:  09/18/2004  T:  09/18/2004  Job:  [id number]

## 2010-12-25 NOTE — Discharge Summary (Signed)
NAMEIMRAAN, [name]               ACCOUNT NO.:  [id number]   MEDICAL RECORD NO.:  [id number]          PATIENT TYPE:  INP   LOCATION:  5029                         FACILITY:  [facility]   PHYSICIAN:  [name], M.D.DATE OF BIRTH:  January 19, 1932   DATE OF ADMISSION:  06/25/2006  DATE OF DISCHARGE:  06/27/2006                                 DISCHARGE SUMMARY   FINAL DIAGNOSES:  1. Partial small bowel obstruction.  2. Abdominal pain secondary to above.  3. Bright red blood per rectum.  4. History of diverticulitis, status post resection.  5. Benign prostatic hypertrophy, followed by Larey Dresser, urology.  6. Paroxysmal atrial fibrillation on Coumadin.  7. Hyperlipidemia.  8. Degenerative joint disease.  9. Osteoarthritis knees.   MEDICATIONS ON DISCHARGE:  1. Warfarin 5 mg once daily.  2. Darvocet-N 100 one to two tablets p.o. b.i.d.  3. Simvastatin 80 mg once daily.  4. Zetia 10 mg once daily.  5. Avodart 0.5 mg once daily.  6. Celebrex 200 mg held x1 week, then patient may resume.  7. Multivitamin.   PHYSICAL EXAM ON DATE OF DISCHARGE:  VITALS:  Blood pressure 130/70, heart  rate 70, respiratory rate 18, temperature 97.2, satting 97% on room air.  GENERAL:  No acute distress.  HEENT:  Normocephalic, atraumatic.  PERRLA.  EOMI.  ENT is within normal  limits.  NECK:  Supple.  No lymphadenopathy, JVD, or bruit.  HEART:  Regular rate and rhythm.  No murmur, rub, or gallop appreciated.  LUNGS:  Clear to auscultation bilaterally.  ABDOMEN:  Soft, nontender.  Normoactive bowel sounds.  No  hepatosplenomegaly.  EXTREMITIES:  No clubbing, cyanosis, or edema.   PERTINENT LABS DATE OF DISCHARGE:  BUN and creatinine are 8 and 0.8  respectively.  Glucose normal at 88.  Basic metabolic panel within normal  limits.  White count 4.7, hemoglobin 14.1, hematocrit 40.6, platelets 213,  INR 2.0, prothrombin time 23.4.  Fecal occult blood testing negative.  CT of  abdomen and pelvis  showed significant prostatic enlargement, which is well  documented by his urologist; dilated proximal small bowel loops with  decompressed distal small bowel and colon, suggesting partial degree of  small bowel obstruction; mild distal descending sigmoid diverticulosis  without evidence of diverticulitis; and a left inguinal hernia which  contains fat.  Flat plate films done in the emergency room showed no acute  abnormalities.   HOSPITAL COURSE:  Mr. Genova presented to the emergency room after having  excruciating pain while being in his kitchen.  He was seen by the emergency  room physician, given pain medications, and had a bowel movement which was  said to have had bright red blood in it.  The patient was admitted by my  partner, Dr. Evlyn Kanner, who obtained GI consultation with Vida Rigger, who had  seen the patient for prior diverticular disease dating back 5 years ago.  The patient was made initially n.p.o. and then put on clear liquid diet.  This was able to be advanced through the time of his discharge and the  patient had 2 solid meals  without any nausea or vomiting and was passing  bowel movements prior to his discharge.  His pain subsided once he was out  of the emergency room and hospitalization was otherwise uneventful.  The  patient maintained IV fluids until the day of his discharge, not having any  episodes of nausea or vomiting and felt to be within his normal state of  health.  The patient was discharged home with his wife with continued  followup, as was scheduled in my office previously.  Of note, the patient's  CT made a point of his enlarged prostate.  He does have an appointment with  Dr. Vonita Moss on Wednesday of this week.  Condition on discharge stable.      [name], M.D.  Electronically Signed     RWT/MEDQ  D:  06/27/2006  T:  06/28/2006  Job:  [id number]   cc:   [name], M.D.

## 2010-12-25 NOTE — Op Note (Signed)
NAMEELLIS, MEHAFFEY NO.:  [id number]   MEDICAL RECORD NO.:  [id number]          PATIENT TYPE:  EMS   LOCATION:  ED                           FACILITY:  [facility]   PHYSICIAN:  Cindee Salt, M.D.       DATE OF BIRTH:  10-Feb-1932   DATE OF PROCEDURE:  08/27/2004  DATE OF DISCHARGE:  08/13/2004                                 OPERATIVE REPORT   PREOPERATIVE DIAGNOSIS:  Mass, left palm.   POSTOPERATIVE DIAGNOSIS:  Mass, left palm.   OPERATION:  Excision mass, reconstruction ulnar artery, left hand.   SURGEON:  Cindee Salt, M.D.   ASSISTANTCarolyne Fiscal.   ANESTHESIA:  General.   INDICATIONS FOR PROCEDURE:  The patient 77 year old male with a history of a  mass, ulnar neuropathy of his left hand. 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   DESCRIPTION OF PROCEDURE:  The patient is brought to the operating room  where a general anesthetic was carried out without difficulty.  He was  prepped using DuraPrep, supine position, left arm free. A longitudinal  incision was made over the hypothenar eminence through Guyon's canal,  carried down through subcutaneous tissue. This was done after exsanguination  of the limb with an Esmarch bandage.  Tourniquet placed on the arm was  inflated to 250 mmHg. The bleeders were electrocauterized. The dissection  carried down through the fascia. The mass was immediately encountered. This  was localized, the ulnar nerve protected. The ulnar artery was isolated.  Vena comitans were dissected off from the mass. The mass was intimate with  the artery.  This was then resected, branches tied.  There was enough laxity  of the remaining artery to allow repair primarily.  The operative microscope  was brought into position. The entire specimen was resected back to normal  intima and sent to pathology.  The repair was then performed with a back  wall first in a running suture using 9-0 nylon sutures, using operative  microscope.  The  tourniquet was deflated. The fingers immediately pinked.  Pulsations were noted through the arterial repair. Bleeders were again  electrocauterized, irrigated. Irrigation of the wound was performed and the  wound closed interrupted 4-0 chromic sutures. Sterile compressive dressing  and splint was applied. The patient tolerated the procedure well was taken  to the recovery room for observation in satisfactory condition. He will be  admitted for overnight stay and will be discharged on Talwin NX and  beginning his Coumadin tomorrow.     GK/MEDQ  D:  08/27/2004  T:  08/27/2004  Job:  [id number]   cc:   Dr. Clarene Duke

## 2010-12-25 NOTE — Op Note (Signed)
[name], [name] NO.:  [id number]   MEDICAL RECORD NO.:  [id number]          PATIENT TYPE:  INP   LOCATION:  0009                         FACILITY:  [facility]   PHYSICIAN:  Ollen Gross, M.D.    DATE OF BIRTH:  1932-04-20   DATE OF PROCEDURE:  11/30/2006  DATE OF DISCHARGE:                               OPERATIVE REPORT   PREOPERATIVE DIAGNOSIS:  Osteoarthritis right knee.   POSTOPERATIVE DIAGNOSIS:  Osteoarthritis right knee.   PROCEDURE:  Right total knee arthroplasty.   SURGEON:  Ollen Gross, M.D.   ASSISTANT:  Alexzandrew L. Perkins, P.A.-C.   ANESTHESIA:  Spinal.   ESTIMATED BLOOD LOSS:  Minimal.   DRAIN:  None.   TOURNIQUET TIME:  41 minutes at 300 mmHg.   COMPLICATIONS:  None.   CONDITION:  Stable to recovery.   CLINICAL NOTE:  [name] is a 77 year old male with end stage arthritis  of the right knee with intractable pain and dysfunction.  In addition,  he has left shoulder bursitis and requested an injection for that.  With  respect to the knee, he has failed nonoperative management and presents  for total knee arthroplasty.   PROCEDURE IN DETAIL:  After the successful administration of a spinal  anesthetic, a tourniquet was placed high on the right thigh and the  right lower extremity prepped and draped in the usual sterile fashion.  The extremity was wrapped in an Esmarch, knee flexed, and the tourniquet  inflated to 300 mmHg.  A midline incision was made with a 10 blade  through the subcutaneous tissue to the level of the extensor mechanism.  A fresh blade is used to make a medial parapatellar arthrotomy.  The  soft tissue over the proximal medial tibia was subperiosteally elevated  to the joint line with the knife and into the semi-membranosis bursa  with a Cobb elevator.  The soft tissue of the proximal lateral tibia is  elevated with attention being paid to avoid the patellar tendon on the  tibial tubercle.  The patella was  subluxed laterally and the knee flexed  90 degrees.  The ACL and PCL were removed.  A drill was used to create a  starting hole and the distal femoral canal was thoroughly irrigated.  A  5 degree right valgus alignment guide is placed and referencing off the  posterior condyles, rotation is marked and the block pinned to remove 10  mm of the distal femur.  Distal femoral resection is made with an  oscillating saw.  A sizing block was placed, a size 5 was the most  appropriate.  Rotation is marked off the epicondylar axis.  Size 5  cutting block is placed and the anterior, posterior, and chamfer cuts  are made.   The tibia was subluxed forward and the menisci were removed.  Extramedullary tibial alignment guide is placed referencing proximally  at the medial aspect of the tibial tubercle and distally along the  second metatarsal axis and tibial crest.  The block is pinned to remove  10 mm off the non-deficient lateral side.  Tibial resection  is made with  an oscillating saw.  Size 5 was the most appropriate tibial component  and the proximal tibia prepared with the modular drill and keel punch  for a size 5.  Femoral preparation is completed with the intercondylar  cut.   Size 5 mobile bearing tibial trial, size 5 posterior stabilized femoral  trial, and a 10 mm posterior stabilized rotating platform insert trial  are placed.  With the 10, full extension is achieved with excellent  varus and valgus balance throughout full range of motion.  The patella  was then everted, thickness measured to 26 mm.  Freehand resection was  taken to 14 mm, 41 template is placed, lug holes were drilled, trial  patella is placed, and it tracks normally.  Osteophytes were removed off  the posterior femur with the trial in place.  All trials are removed and  the cut bone surfaces prepared with pulsatile lavage.  Cement is mixed  and once ready for implantation, the size 5 mobile bearing tibial tray,  size 5  posterior stabilized femur, 41 patella are cemented in place.  The patella is held with a clamp.  A trial 10 mm insert is placed and  the knee held in full extension. All extruded cement is removed.  The  wound is then copiously irrigated with saline solution.  The trial is  removed and then FloSeal injected onto the posterior capsule.  The  permanent 10 mm posterior stabilized rotating platform insert is then  placed in the tibial tray.   The suprapatellar area, medial and lateral gutters were also infiltrated  with the FloSeal.  We left this in for about a  minute or two and we let  the tourniquet down and there was minimal bleeding.  We then thoroughly  irrigated with saline solution.  The extensor mechanism was then closed  with interrupted #1 PDS.  Flexion against gravity to 135 degrees.  Subcu  was closed with interrupted 2-0 Vicryl and subcuticular with running 4-0  Monocryl.  The incision is cleaned and dried and Steri-Strips and a  bulky sterile dressing applied.   I then thoroughly prepped his left shoulder and did a subacromial  injection of lidocaine and Depo-Medrol.  The patient was subsequently  awakened and transferred to the recovery room in stable condition.      Ollen Gross, M.D.  Electronically Signed     FA/MEDQ  D:  11/30/2006  T:  11/30/2006  Job:  [id number]

## 2010-12-25 NOTE — Consult Note (Signed)
[name], [name] NO.:  [id number]   MEDICAL RECORD NO.:  [id number]          PATIENT TYPE:  INP   LOCATION:  5532                         FACILITY:  [facility]   PHYSICIAN:  Gwen Pounds, MD       DATE OF BIRTH:  1931-12-10   DATE OF CONSULTATION:  DATE OF DISCHARGE:  01/15/2005                                   CONSULTATION   PRIMARY CARE Melva Faux:  Dr. Wylene Simmer.   CONSULTING PHYSICIAN:  Dr. Luther Parody.   CHIEF COMPLAINT:  Left lower quadrant and left flank pain.   HISTORY OF PRESENT ILLNESS:  This is a 77 year old male admitted through Dr.  Joanette Gula office yesterday with a questionable diverticulitis attack.  He  presented to the office with a 2-3 day history of severe stabbing left lower  quadrant and left flank pain.  He denied any change in bowel movements, no  bowel or bladder issues, no nausea, vomiting or diarrhea.  His pain radiated  across his abdomen.  Pain was so severe he was not able to walk upright  without holding his abdomen because of severity in the pain and the severity  in his symptoms and his prior history of severe diverticulitis which  ruptured, requiring a segmental colectomy and temporary colostomy which has  since been taken down, he was admitted to the hospital for further  evaluation and treatment.  Admission workup thus far has been negative.  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  At this current time his side is  still hurting, it comes and goes and can be severe.  His abdominal pain and  abdominal issues, left lower quadrant issues are 50-75% better.  I am asked  to see the patient to see if anything further inpatient needs to be done and  if workup can be finished as an outpatient.   PAST MEDICAL HISTORY:  1.  Coronary artery disease in 2000.  2.  Left knee replacement in  2000.  3.  History of diverticulitis status post rupture, requiring surgery in 2002      with temporary colostomy.  4.  Reversal of temporary colostomy.  5.  Paroxysmal atrial fibrillation on Coumadin.  6.  Chronic proteinuria.  7.  Hypertension.  8.  Left inguinal hernia.   MEDICATION LIST INCLUDES:  1.  Coumadin 5 mg six days a week and 7.5 mg on Wednesday.  2.  Zocor 20 mg daily.  3.  Stool softeners.  4.  Multivitamin one p.o. daily.  5.  Darvocet p.r.n.  6.  Zetia 10 mg daily.  7.  Avodart.  8.  Celebrex.  9.  Flomax 0.4 mg p.o. daily.  10. TriCor 145 mg daily.  11. Celebrex 200 mg p.o. b.i.d.  12. Glucosamine daily.   ALLERGIES:  1.  VICODIN.  2.  SULFA.  3.  NIASPAN.   FAMILY HISTORY:  Father died of a stroke, mother died of meningitis, one  brother with diabetes.   SOCIAL HISTORY:  Patient is retired, married, has 5 children. Used to work  running [employer], drinking alcohol rarely and quit smoking many, many  years ago.   REVIEW OF SYSTEMS:  Patient denies any fevers, chills, nausea, vomiting,  diarrhea, any other abdominal issues and has been seeing Dr. Vonita Moss for  urinary issues but not currently symptomatic.  He denies any chest pain,  shortness of breath, dyspnea on exertion or other cardiopulmonary type  issues.  Denies any neurologic type issues, denies any anxiety or  depression.  His only major issues at this current time are the flank pain  and the left lower quadrant pain, and, again, the left lower quadrant pain  is better but the flank pain is still present.   PHYSICAL EXAM:  Temperature 97.2, heart rate 52, respiratory rate 20, blood  pressure 119/71, sating 95% on room air.  GENERAL:  Alert and oriented x3, smiling and comfortable despite holding his  left side in pain.  EAR, NOSE AND THROAT:  PERRL, EOMI.  Oropharynx is clear.  NECK:  No JVD.  CARDIAC:  Regular.  PULMONARY:  Clear to auscultation bilaterally.  ABDOMEN:  Extensive  surgical scars noted.  He is obese, soft, nontender, no  rebound, no guarding.  FLANK:  No CVA tenderness.  BACK:  No spinal tenderness, no straight leg raise issues, no sciatica type  issues, no neurologic deficits and lower extremities status post left TKR.  The patient is up walking in the room and requesting to go home.   ANCILLARY DATA:  Urinalysis is negative.  INR was 2.3, white count 7.2,  hemoglobin 14.4, platelet count 329, sodium 138, potassium 3.6, chloride  106, bicarbonate 26, BUN 11, creatinine 0.8, glucose 112, LFTs are fine.  CT  abdomen shows no abnormalities, small ventral hernia, colonic diverticulosis  without diverticulitis and moderate BPH noted.  Abdominal series was  negative.   ASSESSMENT:  This is a 77 year old male, with medical problems listed below,  who was admitted by Dr. Luther Parody yesterday with left lower quadrant pain,  left flank pain that was consistent with the patient's diverticulitis.  Inpatient workup was negative and did not reveal any diverticulitis.  I was  asked to see the patient to see if there is anything else that needs to be  done inpatient before the patient goes home and whether any workup should be  done outpatient.   PLAN:  1.  I believe the patient can be safely discharged home.  2.  His skin was fine and there was no evidence of shingles.  3.  His abdominal exam was benign, a CT scan was benign and there is no      evidence of diverticulitis.  Also, his white count was normal and he is      afebrile.  4.  Despite his significant pain, his blood pressure is under control and he      is able to smile and function.  This may be an element of neuropathic      pain and may be benefitted with ice and maybe a short course of      Neurontin.  I will discharge him on this medication.  5.  There is no evidence that this is kind of disk issues or spinal stenosis     being that he does not have any findings on neurologic exam.  I guess       this can be musculoskeletal in nature  and may need back x-rays or maybe      even an MRI if this continues.  Dr. Luther Parody did put him on Robaxin and      this really did not help.  Physical therapy was ordered and does not      appear to have been done yet.  I do not necessarily think it is      necessary.  He has been walking around without any issue.  6.  Dr. Luther Parody also put him on Cipro and Flagyl for the possibility of      diverticulitis and then after the reports came back negative these were      discontinued.   In summary, Mr. Furia was admitted through Dr. Joanette Gula office and with  significant flank and abdominal pain, has had a negative workup and can  safely be discharged home.  The family has been talked with and they will  call me if any changes in the status.  They will report back to the  emergency room if anything significant comes up, but this can be followed as  an outpatient and if on Monday he is still having issues he is to call the  office and Dr. Wylene Simmer will get him in and continue the workup.  Again, he  looks good enough, he understands the plan and this will be finished  evaluated.       JMR/MEDQ  D:  01/15/2005  T:  01/15/2005  Job:  [id number]

## 2010-12-25 NOTE — Discharge Summary (Signed)
. [facility]  Patient:    [name], [name]                      MRN: [id number] Adm. Date:  [id number] Disc. Date: [id number] Attending:  Brandy Hale CC:         Jonelle Sports. Cheryll Cockayne, M.D.  [name], M.D.  [name], M.D.  [name], M.D.   Discharge Summary  FINAL DIAGNOSES: 1. Ruptured acute diverticulitis with multiple interloop abscesses. 2. Postoperative wound infection. 3. Elevated liver function tests of uncertain etiology, presumed    hepatocellular dysfunction. 4. Coronary artery disease, status post myocardial infarction in September of    2000. 5. Status post percutaneous transluminal coronary angioplasty with stent. 6. Chronic proteinuria. 7. Microscopic hematuria, chronic. 8. Borderline hypertension. 9. Status post total left knee replacement.  OPERATIONS PERFORMED:  Exploratory laparotomy with sigmoid colectomy, colostomy, small bowel resection with anastomosis, and appendectomy.  Date of surgery Dec 28, 2000.  HISTORY OF PRESENT ILLNESS:  This is a 77 year old white man who presented to the emergency room on Dec 20, 2000, with diffuse lower abdominal pain for 24 hours.  Denies fevers, chills, nausea, or vomiting and was having normal bowel movements.  He came to the emergency room and was evaluated by the emergency department physician.  A CT scan suggested diverticulitis with microscopic perforation and I was called to evaluate the patient.  For details of the past history, social history, and family history, please see the detailed admission note.  PHYSICAL EXAMINATION:  An overweight white gentleman in moderate distress.  VITAL SIGNS:  Oxygen saturation 92% on room air, blood pressure 136/59, heart rate 79 and regular, respiratory rate 24, and temperature 98.6 degrees.  NECK:  No mass.  No bruit.  LUNGS:  Clear to auscultation.  HEART:  Regular rate and rhythm.  No murmur.  ABDOMEN:  Obese.   Diminished bowel sounds.  Diffuse tenderness and guarding, but most noticeable in the lower quadrants.  No palpable mass.  Significant tenderness.  ADMISSION LABORATORY DATA:  WBC 15,400.  The urinalysis shows 5-10 rbcs. Comprehensive metabolic panel normal.  The CT scan did show a thickened sigmoid colon with numerous diverticula and surrounding inflammatory changes in the mesentery.  There were a few tiny air bubbles suggesting microperforation, but no significant amount of free air and no abscess or fluid collection.  HOSPITAL COURSE:  The patient was admitted and placed on broad spectrum antibiotics.  It was felt appropriate to treat him non-operatively on initial presentation with the thought that he would respond to antibiotics.  Over the next two or three days, he felt better.  The pain improved.  He had some low-grade fevers, but no significant high fevers.  Over the next four or five days, he improved symptomatically with resolution of fever and resumption of stools.  He had one small fluid collection on CT scan, which was drained percutaneously and showed clear fluid.  Grams stain was negative.  The culture was negative.  On Dec 28, 2000, the patient was noted to have fairly diffuse abdominal pain and was found to be more tender.  The white blood cell count went up to 21,000 and I became more concerned about his situation.  We repeated his CT scan and that showed persistent lower midline interloop abscesses.  I became concerned about the fluid collections, as well as the diagnosis possibly being Meckels, appendicitis, or perforated cancer.  I advised the  patient to undergo surgery.  He agreed with this.  Preoperatively, he was seen by [name], M.D., his cardiologist, who felt that his coronary artery disease was fairly stable and he should be okay for surgery from a cardiac standpoint.  He was followed in the postoperative period by [name], M.D.  The  patient was taken to the operating room on Dec 28, 2000, and found to have ruptured sigmoid diverticulitis with multiple interloop abscesses.  One of these abscesses had badly damaged a segment of small bowel which had to be resected.  The appendix was also involved in one of the abscesses, but did not appear to be primarily inflamed and the appendix was removed.  A sigmoid colectomy and colostomy were performed.  Postoperatively, the patient did reasonably well.  Nutrition was supported with intravenous hyperalimentation initially and then ultimately he was able to resume an oral diet.  He was observed in the intensive care unit for a few days and from a cardiac status remained stable.  He had a positive fluid balance and had to be given Lasix on several occasions to help avoid pulmonary edema.  On Dec 31, 2000, he was noted have gone into atrial fibrillation with a ventricular rate of 140 and was placed on a Cardizem drip and intravenous heparin.  That controlled his atrial fibrillation quite nicely.  After he went back into sinus rhythm a couple of days later, we stopped the heparin drip, but continued the Cardizem drip.  The Foley catheter was removed.  He did void reasonably well.  He slowly progressed in his diet and activities.  As he was recovering from his surgery, it was noted that his liver function test went up to a total bilirubin of 3.8 and an alkaline phosphatase of 244. This was in association with hyperalimentation.  This was observed for some time.  The patient was ultimately seen by [name], M.D., who felt that this was most likely a hepatocellular process and felt that we could follow this as an outpatient and consider ERCP at a later date if it became indicated.  The patient was certainly asymptomatic and had no biliary tract  symptoms.  The patient also had some problems voiding and was seen by Maretta Bees. Vonita Moss, M.D., who is his urologist.  He was started on  Flomax and that improved and stabilized his voiding difficulties.  It should be noted that a gallbladder ultrasound showed only sludge in the gallbladder and the common bile duct was not dilated.  His biliary actually started coming down, but his alkaline phosphatase went up to 587.  [name], M.D., and I again discussed this, but since the patient was asymptomatic, Dr. Ewing Schlein felt that ERCP was not indicated unless he became symptomatic or if his common bile duct became dilated.  The patient did well and was discharged on January 12, 2001.  At that time, he was afebrile, comfortable, and wanted to go home.  His wound was clean at the time of discharge.  He was tolerating a diet at the time of discharge.  He was afebrile.  In terms of his paroxysmal atrial fibrillation, he had been converted to Coumadin by [name], M.D., and Dr. Clarene Duke was going to follow him as an outpatient for that.  In terms of his abnormal liver function tests, we continued to think that this was a cholestatic process secondary to hyperalimentation.  We planned to follow this up with liver function tests as an outpatient.  Upon discharge, the patient was asked to return to see me in 7-10 days and check liver function tests at that time.  He was also to see [name], M.D., as an outpatient.  He was also to follow up with Maretta Bees. Vonita Moss, M.D., in two weeks.  DISCHARGE MEDICATIONS:  Coumadin, Lopressor, Lanoxin, Protonix, Flomax, and Tylox. DD:  02/08/01 TD:  02/08/01 Job: [id number]

## 2010-12-25 NOTE — H&P (Signed)
NAMETAVIOUS, GRIESINGER               ACCOUNT NO.:  [id number]   MEDICAL RECORD NO.:  [id number]          PATIENT TYPE:  INP   LOCATION:  1826                         FACILITY:  [facility]   PHYSICIAN:  Tera Mater. Evlyn Kanner, M.D. DATE OF BIRTH:  1932-05-23   DATE OF ADMISSION:  06/25/2006  DATE OF DISCHARGE:                                HISTORY & PHYSICAL   ADDENDUM   I was misinformed, but apparently Mr. Eddinger has already had a CT of the  abdomen.  The exam suggest a partial small-bowel obstruction, fatty  infiltration of liver and renal cyst disease is noted.  The prostate is  noted to be enlarged significantly and there is decompressed small bowel in  colon to suggest a partial degree of obstruction.  There is diverticulosis  without diverticulitis.           ______________________________  Tera Mater. Evlyn Kanner, M.D.     SAS/MEDQ  D:  06/26/2006  T:  06/26/2006  Job:  [id number]

## 2010-12-25 NOTE — Op Note (Signed)
[name], [name]               ACCOUNT NO.:  [id number]   MEDICAL RECORD NO.:  [id number]          PATIENT TYPE:  AMB   LOCATION:  [facility]                         FACILITY:  [facility]   PHYSICIAN:  Maretta Bees. Vonita Moss, M.D.DATE OF BIRTH:  July 11, 1932   DATE OF PROCEDURE:  12/15/2005  DATE OF DISCHARGE:                                 OPERATIVE REPORT   PREOPERATIVE DIAGNOSIS:  Phimosis and posthitis.   POSTOPERATIVE DIAGNOSIS:  Phimosis and posthitis.   PROCEDURE:  Circumcision.   SURGEON:  Dr. Larey Dresser.   ANESTHESIA:  General.   INDICATIONS:  This gentleman has an unretractable foreskin with redness and  irritation.  He was counseled about circumcision, obtained preoperative  clearance and is brought to the OR today for circumcision.   PROCEDURE:  The patient brought is prepped and draped in the usual fashion.  He had a tight phimosis and circumcision was performed by the use of the  sleeve technique.  After exposing the glans penis, he was then prepped with  some more Betadine.  A few small bleeders were controlled with  electrocautery and the skin under the distal edge of the penile shaft was  infiltrated with 1% Marcaine for postop analgesia.  The frenular area was  then closed with running 4-0 chromic catgut, 4-0 chromic catgut was placed  with 3, 6, 9 and 12 o'clock and a running 4-0 chromic catgut was placed  between these quadrant sutures.  At this point, he had good hemostasis and  Vaseline gauze, dry sterile gauze and a Coban dressing was placed on the  penis and he sent to the recovery room in good condition, having tolerated  the procedure well.      Maretta Bees. Vonita Moss, M.D.  Electronically Signed     LJP/MEDQ  D:  12/15/2005  T:  12/16/2005  Job:  [id number]   cc:   Thereasa Solo. Little, M.D.  Fax: [phone]

## 2010-12-25 NOTE — H&P (Signed)
[name], Anthony               ACCOUNT NO.:  [id number]   MEDICAL RECORD NO.:  [id number]          PATIENT TYPE:  INP   LOCATION:  NA                           FACILITY:  [facility]   PHYSICIAN:  Ollen Gross, M.D.    DATE OF BIRTH:  06-Sep-1931   DATE OF ADMISSION:  DATE OF DISCHARGE:                              HISTORY & PHYSICAL   CHIEF COMPLAINT:  Right knee pain.   HISTORY OF PRESENT ILLNESS:  The patient is a 77 year old male who has  seen by Dr. Lequita Halt for significant right knee pain.  It has  progressively gotten worse.  He has had a left total knee back in 2001  by Dr. Ophelia Charter and did well with that.  The right knee has progressively  gotten worse over the past several years.  He has been seen by Dr.  Clarene Duke recently and was cleared from a cardiology standpoint.  He was  found to have end-stage arthritis, tricompartmental in nature, in the  right knee which has progressively gotten worse and was felt would  benefit from surgery.  Risks and benefits have been discussed.  He  elected to proceed with surgery.   ALLERGIES:  VICODIN causes hives.   CURRENT MEDICATIONS:  Coumadin, Darvocet, pravastatin, triglyceride,  Zetia, sertraline, Flomax, Mature complete multivitamin, Equate stool  softener and loratadine.[name]   PAST FAMILY PSYCHIATRIC HISTORY:  1. Paroxysmal atrial fibrillation.  2. Coronary arterial disease.  3. History of myocardial infarction.  4. Hypercholesterolemia.  5. Angina.  6. Diverticulosis.  7. Benign prostatic hypertrophy.  8. History of urinary tract infection.   PAST SURGICAL HISTORY:  1. Right shoulder repair.  2. Partial colectomy with colostomy and then reversal colostomy      surgery.  3. Left total knee by Dr. Ophelia Charter.  4. Circumcision.   SOCIAL HISTORY:  Married.  Nonsmoker, no alcohol.  Five children.  Lives  in a one-story home with a ramp going into his house.   FAMILY HISTORY:  Negative.   REVIEW OF SYSTEMS:  GENERAL:  No fevers,  chills or night sweats.  NEURO:  No seizures or paralysis.  RESPIRATORY:  No shortness breath at rest,  but he does have some shortness of breath on exertion.  No productive  cough or hemoptysis.  CARDIOVASCULAR:  History of angina, none recently.  No orthopnea.  No palpitations.  GI:  No nausea, vomiting, diarrhea or  constipation.  GU: No dysuria, hematuria or discharge.  MUSCULOSKELETAL:  Right knee.   PHYSICAL EXAMINATION:  VITAL SIGNS:  Pulse 80, respirations 12, blood  pressure 142/68.  GENERAL:  This is a 77 year old, white male, well-nourished, well-  developed, overweight in no acute distress.  He is alert, oriented and  cooperative.  Average historian.  He is accompanied by his daughter.  HEENT:  Normocephalic, atraumatic.  Pupils are round and reactive.  Oropharynx clear.  EOMs intact.  NECK:  Supple.  CHEST:  Barrel-chested individual.  Clear anterior and posterior chest  walls.  HEART:  Slightly diminished heart sounds.  Regular rhythm.  S1 and S2  noted.  ABDOMEN:  Soft,  round, protuberant abdomen.  Bowel sounds present.  GENITALIA:  Benign.  EXTREMITIES:  Right knee shows a varus malalignment deformity.  Range of  motion is 10 to 110.  Tender more medial with lateral marked crepitus.  No instability.   IMPRESSIONS:  1. Osteoarthritis, right knee.  2. Paroxysmal atrial fibrillation.  3. Hypercholesterolemia.  4. Coronary arterial disease.  5. History of myocardial infarction.  6. History of angina.  7. Diverticulosis.  8. History of benign prostatic hypertrophy.  9. History of urinary tract infections.  10.History of elevated prostate specific antigen.   PLAN:  The patient was admitted to [facility] to undergo right  total knee replacement with arthroplasty.  Surgery will be performed by  Dr. Ollen Gross.  His cardiologist is Dr. Clarene Duke who has seen  preoperatively and has given clearance.  Will be consulted if needed for  any cardiac assistance  with the patient throughout the hospital course.      Alexzandrew L. Perkins, P.A.C.      Ollen Gross, M.D.  Electronically Signed    ALP/MEDQ  D:  11/29/2006  T:  11/30/2006  Job:  [id number]   cc:   Ollen Gross, M.D.  Fax: [phone]   Cisafix, M.D.   Thereasa Solo. Little, M.D.  Fax: [phone]   Maretta Bees. Vonita Moss, M.D.  Fax: [phone]

## 2010-12-25 NOTE — H&P (Signed)
NAMEGLENWOOD, REVOIR               ACCOUNT NO.:  [id number]   MEDICAL RECORD NO.:  [id number]          PATIENT TYPE:  INP   LOCATION:  5524                         FACILITY:  [facility]   PHYSICIAN:  Althea Grimmer. Santogade, M.D.DATE OF BIRTH:  03-08-1932   DATE OF ADMISSION:  01/14/2005  DATE OF DISCHARGE:                                HISTORY & PHYSICAL   HISTORY OF PRESENT ILLNESS:  [name] is a 77 year old male who walked into  our office today holding his left side complaining of severe pain for the  past 3 days which has been worsening. He has a prior history of ruptured  diverticulitis requiring segmental colectomy and temporary colostomy which  was reversed in December 2002. Since that time, he has had an episode of  mild diverticulitis in September 2005. Today, he says that the pain began in  his left side and is now radiating toward the mid of his abdomen. He has no  nausea or vomiting, chills or fever. Bowel movements have decreased in  frequency and size but he did move his bowels this morning and did not see  any blood. Usually he has two or three bowel movements per day. The current  pain feels like what he feels like his diverticulitis feels like. He denies  dysphagia but reports that he does get choked easily when he is eating. He  has no upper abdominal pain.   PAST MEDICAL HISTORY:  Pertinent for a heart attack in 2000, knee  replacement surgery in 2000, and diverticulitis with surgery in 2002. He  also has paroxysmal atrial fibrillation managed with Coumadin, chronic  proteinuria, hypertension, and a left inguinal hernia.   CURRENT MEDICATIONS:  1.  Coumadin 5 mg six days a week and 7.5 mg on Wednesday.  2.  Zocor 20 mg daily.  3.  Stool softeners.  4.  Multivitamin.  5.  Darvocet.  6.  Zetia.  7.  Avodart.  8.  Celebrex.  9.  Flomax.   ALLERGIES:  1.  VICODIN.  2.  SULFA.   FAMILY HISTORY:  Noncontributory.   SOCIAL HISTORY:  Married and retired, nonsmoker,  nondrinker.   REVIEW OF SYSTEMS:  GENERAL:  No weight loss or night sweats. ENDOCRINE:  No  known diabetes or thyroid problems. SKIN:  No rash or pruritus. EYES:  No  icterus or change in vision. ENT:  No aphthous ulcers or chronic sore  throat. RESPIRATORY:  No shortness of breath, cough, or wheezing. CARDIAC:  As above, chronic Coumadin use for paroxysmal atrial fibrillation. GI:  As  above. GU:  History of kidney stones.   PHYSICAL EXAMINATION:  GENERAL:  He is an obese elderly male in moderate  discomfort.  VITAL SIGNS:  Afebrile, weighing 258, blood pressure 130/68, pulse 70 and  regular.  SKIN:  Normal.  HEENT:  Eyes anicteric. Conjunctivae pink. Oropharynx unremarkable.  NECK:  Supple without thyromegaly.  CHEST SOUNDS:  Clear.  HEART SOUNDS:  Regular rate and rhythm.  ABDOMEN:  Obese and soft with mild left-sided abdominal pain to deep  palpation. However, the patient describes much deeper,  more severe  discomfort.  RECTAL:  Exam not performed.  EXTREMITIES:  Without edema.   IMPRESSION:  Elderly male with probable recurrent diverticulitis.   PLAN:  Since he lives in [place] and is in considerable distress at  present and had a history of ruptured diverticulitis, I think it would be  reasonable to admit him at least overnight for initiation of antibiotic  therapy, CT scan, and observation. CT scan will also allow me to rule out  kidney stone. Please see the orders. Treatment is begun with ciprofloxacin  and Flagyl.       PJS/MEDQ  D:  01/14/2005  T:  01/14/2005  Job:  [id number]   cc:   [name], M.D.  [address], [address]  [place]  [place] [address]  Fax: [phone]   Angelia Mould. Derrell Lolling, M.D.  [address]  [place]  [place] [address]   [name], M.D.  [address]  [place]  [place] [address]  Fax: [phone]   Thereasa Solo. Little, M.D.  [address]  [address]  [place]  [place] [address]  Fax: [phone]

## 2010-12-25 NOTE — Discharge Summary (Signed)
[place]  Patient:    [name], [name] Visit Number: [id number] MRN: [id number]          Service Type: SUR Location: 5700 [id number] Attending Physician:  Brandy Hale Dictated by:   Angelia Mould. Derrell Lolling, M.D. Admit Date:  07/31/2001 Discharge Date: 08/05/2001   CC:         [name], M.D.             [name], M.D.             [name], M.D.             [name], M.D.                           Discharge Summary  FINAL DIAGNOSES: 1. Ruptured diverticulitis, status post colectomy with colostomy. 2. History of paroxysmal atrial fibrillation on Coumadin. 3. Status post myocardial infarction. 4. Chronic proteinuria. 5. Asymptomatic left inguinal hernia. 6. Hypertension.  OPERATIONS PERFORMED:  Resection and closure of colostomy with coloproctostomy, July 31, 2001.  HISTORY:  This is a 77 year old white man with the above-listed medical problems.  He presented to this hospital in May 2002 with complicated, ruptured, acute diverticulitis with multiple intralumen abscesses.  He underwent sigmoid colectomy, colostomy, appendectomy, and segmental small bowel resection and drainage of his multiple intralumen abscesses.  After a long hospital course, he did recover.  He would like his colostomy closed.  He has undergone a colonoscopy as an outpatient and both the proximal and distal segments of his colon look normal.  He has undergone bowel prep at home and was brought to he hospital electively.  PHYSICAL EXAMINATION:  GENERAL:  An overweight gentleman who is pleasant and in no distress.  LUNGS:  Clear to auscultation.  HEART:  Regular rate and rhythm without murmur.  ABDOMEN:  Soft, obese, midline wound well healed.  No hernia.  Colostomy on the left side healthy.  No hernia on the colostomy.  He has a small left inguinal hernia.  HOSPITAL COURSE:  On the day of admission, the patient was taken to  the operating room and underwent exploratory laparotomy with resection and closure of his colostomy with an anastomosis in the pelvis.  Postoperatively, he did reasonably well.  He had some bradycardia and was followed by Dr. Caprice Kluver for that.  Medications were modified.  Digoxin was held and he did well and was asymptomatic.  He progressed in his diet and activities reasonably well.  He was on a liquid diet by July 28, 2001 and was restarted on his Coumadin at that time.  He slowly progressed his diet and activities and did well.  He was discharged on August 05, 2001.  At that time, he was afebrile, tolerating a diet.  His wound looked fine.  Coumadin had been adjusted by the pharmacy and the plan was for him to follow up with Dr. Caprice Kluver or Dr. Tina Griffiths to control his Coumadin dose.  He was to follow up with me in about one week. Dictated by:   Angelia Mould. Derrell Lolling, M.D. Attending Physician:  Brandy Hale DD:  08/16/01 TD:  08/17/01 Job: [id number]

## 2011-02-01 HISTORY — PX: US ECHOCARDIOGRAPHY: HXRAD669

## 2011-05-26 LAB — BASIC METABOLIC PANEL
BUN: 15
Chloride: 107
Creatinine, Ser: 0.63
Glucose, Bld: 93
Potassium: 4.5

## 2011-05-26 LAB — HEMOGLOBIN AND HEMATOCRIT, BLOOD: Hemoglobin: 14.2

## 2011-05-26 LAB — APTT: aPTT: 35

## 2011-05-26 LAB — PROTIME-INR: INR: 2 — ABNORMAL HIGH

## 2011-08-13 DIAGNOSIS — Z7901 Long term (current) use of anticoagulants: Secondary | ICD-10-CM | POA: Diagnosis not present

## 2011-08-13 DIAGNOSIS — Z0000 Encounter for general adult medical examination without abnormal findings: Secondary | ICD-10-CM | POA: Diagnosis not present

## 2011-08-13 DIAGNOSIS — E785 Hyperlipidemia, unspecified: Secondary | ICD-10-CM | POA: Diagnosis not present

## 2011-08-13 DIAGNOSIS — Z125 Encounter for screening for malignant neoplasm of prostate: Secondary | ICD-10-CM | POA: Diagnosis not present

## 2011-08-13 DIAGNOSIS — I2510 Atherosclerotic heart disease of native coronary artery without angina pectoris: Secondary | ICD-10-CM | POA: Diagnosis not present

## 2011-08-13 DIAGNOSIS — I10 Essential (primary) hypertension: Secondary | ICD-10-CM | POA: Diagnosis not present

## 2011-08-16 DIAGNOSIS — Z1212 Encounter for screening for malignant neoplasm of rectum: Secondary | ICD-10-CM | POA: Diagnosis not present

## 2011-09-23 DIAGNOSIS — R000 Tachycardia, unspecified: Secondary | ICD-10-CM | POA: Diagnosis not present

## 2011-09-23 DIAGNOSIS — I495 Sick sinus syndrome: Secondary | ICD-10-CM | POA: Diagnosis not present

## 2011-09-23 DIAGNOSIS — I472 Ventricular tachycardia: Secondary | ICD-10-CM | POA: Diagnosis not present

## 2011-09-28 DIAGNOSIS — I4891 Unspecified atrial fibrillation: Secondary | ICD-10-CM | POA: Diagnosis not present

## 2011-09-28 DIAGNOSIS — Z7901 Long term (current) use of anticoagulants: Secondary | ICD-10-CM | POA: Diagnosis not present

## 2011-10-08 ENCOUNTER — Encounter (HOSPITAL_COMMUNITY): Payer: Self-pay | Admitting: Pharmacist

## 2011-10-08 ENCOUNTER — Other Ambulatory Visit: Payer: Self-pay | Admitting: Cardiovascular Disease

## 2011-10-08 DIAGNOSIS — I4891 Unspecified atrial fibrillation: Secondary | ICD-10-CM | POA: Diagnosis not present

## 2011-10-08 DIAGNOSIS — Z79899 Other long term (current) drug therapy: Secondary | ICD-10-CM | POA: Diagnosis not present

## 2011-10-08 DIAGNOSIS — R5381 Other malaise: Secondary | ICD-10-CM | POA: Diagnosis not present

## 2011-10-08 DIAGNOSIS — Z7901 Long term (current) use of anticoagulants: Secondary | ICD-10-CM | POA: Diagnosis not present

## 2011-10-11 MED ORDER — CHLORHEXIDINE GLUCONATE 4 % EX LIQD
60.0000 mL | Freq: Once | CUTANEOUS | Status: DC
  Filled 2011-10-11: qty 60

## 2011-10-11 MED ORDER — SODIUM CHLORIDE 0.45 % IV SOLN
INTRAVENOUS | Status: DC
  Administered 2011-10-12: 09:00:00 via INTRAVENOUS

## 2011-10-11 MED ORDER — SODIUM CHLORIDE 0.9 % IR SOLN
80.0000 mg | Status: DC
  Filled 2011-10-11: qty 2

## 2011-10-11 MED ORDER — SODIUM CHLORIDE 0.9 % IV SOLN: INTRAVENOUS | Status: DC

## 2011-10-11 MED ORDER — CEFAZOLIN SODIUM-DEXTROSE 2-3 GM-% IV SOLR
2.0000 g | INTRAVENOUS | Status: DC
  Filled 2011-10-11: qty 50

## 2011-10-12 ENCOUNTER — Ambulatory Visit (HOSPITAL_COMMUNITY)
Admission: RE | Admit: 2011-10-12 | Discharge: 2011-10-13 | Disposition: A | Discharge status: Home / Self Care | Payer: Medicare Other | Source: Ambulatory Visit | Attending: Cardiovascular Disease | Admitting: Cardiovascular Disease

## 2011-10-12 ENCOUNTER — Encounter (HOSPITAL_COMMUNITY): Payer: Self-pay | Admitting: *Deleted

## 2011-10-12 ENCOUNTER — Encounter (HOSPITAL_COMMUNITY)
Admission: RE | Disposition: A | Discharge status: Home / Self Care | Payer: Self-pay | Source: Ambulatory Visit | Attending: Cardiovascular Disease

## 2011-10-12 DIAGNOSIS — G4730 Sleep apnea, unspecified: Secondary | ICD-10-CM | POA: Diagnosis not present

## 2011-10-12 DIAGNOSIS — R0609 Other forms of dyspnea: Secondary | ICD-10-CM | POA: Diagnosis not present

## 2011-10-12 DIAGNOSIS — I4819 Other persistent atrial fibrillation: Secondary | ICD-10-CM | POA: Diagnosis present

## 2011-10-12 DIAGNOSIS — Z7901 Long term (current) use of anticoagulants: Secondary | ICD-10-CM | POA: Diagnosis not present

## 2011-10-12 DIAGNOSIS — G250 Essential tremor: Secondary | ICD-10-CM | POA: Diagnosis present

## 2011-10-12 DIAGNOSIS — I4891 Unspecified atrial fibrillation: Secondary | ICD-10-CM | POA: Insufficient documentation

## 2011-10-12 DIAGNOSIS — R5383 Other fatigue: Secondary | ICD-10-CM | POA: Diagnosis present

## 2011-10-12 DIAGNOSIS — R5381 Other malaise: Secondary | ICD-10-CM | POA: Insufficient documentation

## 2011-10-12 DIAGNOSIS — Z22322 Carrier or suspected carrier of Methicillin resistant Staphylococcus aureus: Secondary | ICD-10-CM | POA: Diagnosis not present

## 2011-10-12 DIAGNOSIS — I2789 Other specified pulmonary heart diseases: Secondary | ICD-10-CM | POA: Insufficient documentation

## 2011-10-12 DIAGNOSIS — R0989 Other specified symptoms and signs involving the circulatory and respiratory systems: Secondary | ICD-10-CM | POA: Insufficient documentation

## 2011-10-12 DIAGNOSIS — G4733 Obstructive sleep apnea (adult) (pediatric): Secondary | ICD-10-CM | POA: Diagnosis present

## 2011-10-12 DIAGNOSIS — I498 Other specified cardiac arrhythmias: Secondary | ICD-10-CM | POA: Diagnosis not present

## 2011-10-12 DIAGNOSIS — G2581 Restless legs syndrome: Secondary | ICD-10-CM | POA: Diagnosis present

## 2011-10-12 DIAGNOSIS — I2729 Other secondary pulmonary hypertension: Secondary | ICD-10-CM | POA: Diagnosis present

## 2011-10-12 DIAGNOSIS — Z950 Presence of cardiac pacemaker: Secondary | ICD-10-CM | POA: Diagnosis not present

## 2011-10-12 DIAGNOSIS — F329 Major depressive disorder, single episode, unspecified: Secondary | ICD-10-CM | POA: Diagnosis present

## 2011-10-12 DIAGNOSIS — I428 Other cardiomyopathies: Secondary | ICD-10-CM

## 2011-10-12 HISTORY — PX: PERMANENT PACEMAKER INSERTION: SHX5480

## 2011-10-12 LAB — SURGICAL PCR SCREEN: Staphylococcus aureus: POSITIVE — AB

## 2011-10-12 SURGERY — PERMANENT PACEMAKER INSERTION
Anesthesia: LOCAL

## 2011-10-12 MED ORDER — MUPIROCIN 2 % EX OINT
TOPICAL_OINTMENT | CUTANEOUS | Status: AC
  Administered 2011-10-12: 1 via NASAL
  Filled 2011-10-12: qty 22

## 2011-10-12 MED ORDER — DOCUSATE SODIUM 100 MG PO CAPS
100.0000 mg | ORAL_CAPSULE | Freq: Every day | ORAL | Status: DC
  Administered 2011-10-13: 100 mg via ORAL
  Filled 2011-10-12: qty 1

## 2011-10-12 MED ORDER — OXYCODONE HCL 5 MG PO TABS
5.0000 mg | ORAL_TABLET | ORAL | Status: DC | PRN
  Administered 2011-10-12: 5 mg via ORAL
  Filled 2011-10-12: qty 1

## 2011-10-12 MED ORDER — LOSARTAN POTASSIUM 50 MG PO TABS: 100.0000 mg | ORAL_TABLET | Freq: Every day | ORAL | Status: DC

## 2011-10-12 MED ORDER — HEPARIN (PORCINE) IN NACL 2-0.9 UNIT/ML-% IJ SOLN
INTRAMUSCULAR | Status: AC
  Filled 2011-10-12: qty 1000

## 2011-10-12 MED ORDER — NITROGLYCERIN 0.4 MG SL SUBL
0.4000 mg | SUBLINGUAL_TABLET | SUBLINGUAL | Status: DC | PRN
Start: 2011-10-12 — End: 2011-10-13

## 2011-10-12 MED ORDER — WARFARIN - PHYSICIAN DOSING INPATIENT
Freq: Every day | Status: DC
  Filled 2011-10-12 (×2): qty 1

## 2011-10-12 MED ORDER — DUTASTERIDE 0.5 MG PO CAPS
0.5000 mg | ORAL_CAPSULE | Freq: Every day | ORAL | Status: DC
  Administered 2011-10-13: 0.5 mg via ORAL
  Filled 2011-10-12: qty 1

## 2011-10-12 MED ORDER — POTASSIUM CHLORIDE CRYS ER 20 MEQ PO TBCR
20.0000 meq | EXTENDED_RELEASE_TABLET | Freq: Every day | ORAL | Status: DC
  Administered 2011-10-13: 20 meq via ORAL
  Filled 2011-10-12: qty 1

## 2011-10-12 MED ORDER — SODIUM CHLORIDE 0.9 % IV SOLN: INTRAVENOUS | Status: AC

## 2011-10-12 MED ORDER — LOSARTAN POTASSIUM 50 MG PO TABS
100.0000 mg | ORAL_TABLET | Freq: Every day | ORAL | Status: DC
  Administered 2011-10-13: 100 mg via ORAL
  Filled 2011-10-12 (×2): qty 2

## 2011-10-12 MED ORDER — LIDOCAINE HCL (PF) 1 % IJ SOLN
INTRAMUSCULAR | Status: AC
  Filled 2011-10-12: qty 60

## 2011-10-12 MED ORDER — ACETAMINOPHEN 325 MG PO TABS: 325.0000 mg | ORAL_TABLET | ORAL | Status: DC | PRN

## 2011-10-12 MED ORDER — CEFAZOLIN SODIUM-DEXTROSE 2-3 GM-% IV SOLR
INTRAVENOUS | Status: AC
  Filled 2011-10-12: qty 50

## 2011-10-12 MED ORDER — FENOFIBRATE 160 MG PO TABS
160.0000 mg | ORAL_TABLET | Freq: Every day | ORAL | Status: DC
  Administered 2011-10-13: 160 mg via ORAL
  Filled 2011-10-12: qty 1

## 2011-10-12 MED ORDER — MUPIROCIN 2 % EX OINT
1.0000 "application " | TOPICAL_OINTMENT | Freq: Two times a day (BID) | CUTANEOUS | Status: DC
  Administered 2011-10-12 – 2011-10-13 (×2): 1 via NASAL
  Filled 2011-10-12: qty 22

## 2011-10-12 MED ORDER — ONDANSETRON HCL 4 MG/2ML IJ SOLN
4.0000 mg | Freq: Four times a day (QID) | INTRAMUSCULAR | Status: DC | PRN
Start: 2011-10-12 — End: 2011-10-13

## 2011-10-12 MED ORDER — FENTANYL CITRATE 0.05 MG/ML IJ SOLN
INTRAMUSCULAR | Status: AC
  Filled 2011-10-12: qty 2

## 2011-10-12 MED ORDER — FUROSEMIDE 20 MG PO TABS
20.0000 mg | ORAL_TABLET | Freq: Every day | ORAL | Status: DC
  Administered 2011-10-12: 20 mg via ORAL
  Filled 2011-10-12 (×2): qty 1

## 2011-10-12 MED ORDER — TAMSULOSIN HCL 0.4 MG PO CAPS
0.4000 mg | ORAL_CAPSULE | Freq: Every day | ORAL | Status: DC
  Administered 2011-10-13: 0.4 mg via ORAL
  Filled 2011-10-12 (×2): qty 1

## 2011-10-12 MED ORDER — CHLORHEXIDINE GLUCONATE CLOTH 2 % EX PADS: 6.0000 | MEDICATED_PAD | Freq: Every day | CUTANEOUS | Status: DC

## 2011-10-12 MED ORDER — MIDAZOLAM HCL 2 MG/2ML IJ SOLN
INTRAMUSCULAR | Status: AC
  Filled 2011-10-12: qty 2

## 2011-10-12 MED ORDER — ESCITALOPRAM OXALATE 20 MG PO TABS
20.0000 mg | ORAL_TABLET | Freq: Every day | ORAL | Status: DC
Start: 2011-10-12 — End: 2011-10-13
  Administered 2011-10-13: 20 mg via ORAL
  Filled 2011-10-12: qty 1

## 2011-10-12 MED ORDER — CEFAZOLIN SODIUM 1-5 GM-% IV SOLN
1.0000 g | Freq: Four times a day (QID) | INTRAVENOUS | Status: AC
  Administered 2011-10-12 – 2011-10-13 (×3): 1 g via INTRAVENOUS
  Filled 2011-10-12 (×3): qty 50

## 2011-10-12 MED ORDER — WARFARIN SODIUM 5 MG PO TABS
5.0000 mg | ORAL_TABLET | ORAL | Status: DC
  Administered 2011-10-12: 5 mg via ORAL
  Filled 2011-10-12: qty 1
  Filled 2011-10-12: qty 2

## 2011-10-12 MED ORDER — WARFARIN SODIUM 2.5 MG PO TABS
2.5000 mg | ORAL_TABLET | ORAL | Status: DC
  Filled 2011-10-12: qty 1

## 2011-10-12 NOTE — H&P (Addendum)
Initial H/P October 08, 2011. History reviewed, patient examined, no change in status, stable for surgery. Thurmon Fair, MD, [facility] [phone] 10/12/2011 8:03 AM

## 2011-10-12 NOTE — Discharge Instructions (Signed)
Pacemaker Implantation A pacemaker (pacer) is a small device that acts as a backup or takes over for the natural pacemaker of the heart. The heart has its own electrical system to regulate the beating of the heart muscles. The heart pumps best when it beats in a regular, coordinated rhythm.  A pacer consists of a small device (generator) which produces electrical signals that tell your heart to beat. The generator contains a lithium battery and a tiny computer. Wires (leads) connect the generator to the heart. The pacer is placed under the skin through a small cut. It senses every heartbeat and only fires when the heart rate falls outside certain levels. When the pacer triggers a heart beat, it is called "capture." PROBLEMS THAT MAY BE HELPED BY A PACEMAKER:  Your heart rate is sometimes too slow or irregular.   Fainting, dizziness, weakness or confusion as a result of low blood flow.   Shortness of breath.   Chest pain or angina if the heart needs more blood and oxygen.   Disturbed sleep as a result of abnormal heart rhythm.   Palpitations or the feeling that the heart is beating too fast, too hard or in an irregular way.   Weak heart muscle pumping ability.  PROCEDURE   The pacer may be placed under the skin near the collarbone, while you are under sedation. An abdominal wall location may be another option.   The leads are inserted into a vein that lies just under the collarbone, then guided into place under x-ray. The tips of the wires touch the inside of the heart. The near end of the pacer wires are connected to the generator under the skin.   For individuals with thinner chest walls, it may be possible to feel the device under the skin, and a slight bump may be seen.  HOME CARE INSTRUCTIONS   Keep the incision dry for a week after the procedure.   For about 8 weeks, avoid sudden or jerky movements that pull your arm away from your body. This could change the position of the leads.     Take medicine exactly as directed.   Learn how to check your pulse. Follow directions about when to call or be concerned.   Be physically active every day. Ask your caregiver how and when to increase activity.   Household appliances do not interfere with pacemakers.   Travel by car, train or airplane should not be a problem. Carry a pacemaker ID card in case the device sets off a metal detector.  PACEMAKER CARE:  Avoid putting pressure over the area where the pacer was put in.   Digital cell phones should be kept 12 inches away from the pacemaker. Hold them at the ear on the side opposite of the pacer.   Never leave a cell phone in a pocket over the pacemaker.   Avoid strong electro-magnetic fields. You will not be able to have an MRI scan because of the strong magnets.   Pacer batteries last about 5 years and give off warning signals when they are running low on power. Pacers may be checked every 3 months. This allows plenty of time to change the generator when it is running low on power.   Changing the battery means removing the old generator through the same cut and plugging the existing wires into the new generator.   An EKG or heart monitor is used to see if your pacer is working properly. Sometimes signals may be sent   over a land line phone to your clinic.   Tell emergency responders that you have a pacemaker.  SEEK MEDICAL CARE IF:  You begin to gain weight and your feet and ankles swell.   You have dizzy spells or feel weak.   Your pulse rate drops below the limit or is too fast.  SEEK IMMEDIATE MEDICAL CARE IF:  You faint or pass out.   You have chest pain or shortness of breath.   You are injured and think your pacemaker may have been damaged.   You are suddenly very tired or have pain in your back.   You are worried that your heart is not beating right or cannot feel your pulse.  Document Released: 07/16/2002 Document Revised: 07/15/2011 Document Reviewed:  09/22/2007 Exit[employer] Patient Information 2012 [employer]. 

## 2011-10-12 NOTE — CV Procedure (Signed)
[name], [name] Male, 78 y.o., 09/12/1931  MRN: [id number]  Procedure report  Procedure performed: 1. Implantation of new single chamber permanent pacemaker 2. Fluoroscopy 3.  Light sedation  Reason for procedure: Symptomatic bradycardia due to atrial fibrillation with slow ventricular response  Procedure performed by: Thurmon Fair, MD  Complications: None  Estimated blood loss: <10 mL  Medications administered during procedure: Ancef 1 g intravenously Lidocaine 1% 30 mL locally,  Fentanyl 50 mcg intravenously Versed 2 mg intravenously  Device detailsElectrical engineer Advantio model [id number] serial number [id number] Right ventricular lead Guidant Dextrus [id number] serial number [id number]   Procedure details:  After the risks and benefits of the procedure were discussed the patient provided informed consent and was brought to the cardiac cath lab in the fasting state. The patient was prepped and draped in usual sterile fashion. Local anesthesia with 1% lidocaine was administered to to the left infraclavicular area. A 5-6 cm horizontal incision was made parallel with and 2-3 cm caudal to the left clavicle. Using electrocautery and blunt dissection a prepectoral pocket was created down to the level of the pectoralis major muscle fascia. The pocket was carefully inspected for hemostasis. An antibiotic-soaked sponge was placed in the pocket.  Under fluoroscopic guidance and using the modified Seldinger technique a venipuncture was performed to access the left subclavian vein. No difficulty was encountered accessing the vein.  A J-tip guidewire was subsequently exchanged for a 7 [place] safe sheath.  Under fluoroscopic guidance the ventricular lead was advanced to level of the mid to apical right ventricular septum and the active-fixation helix was deployed. Prominent current of injury was seen. Satisfactory pacing and sensing parameters were recorded. There was no evidence of  diaphragmatic stimulation at maximum device output. The safe sheath was peeled away and the lead was secured in place with 2-0 silk.  The antibiotic-soaked sponge was removed from the pocket. The pocket was flushed with copious amounts of antibiotic solution. Reinspection showed excellent hemostasis.  The ventricular lead was connected to the generator and appropriate ventricular pacing was seen. Repeat testing of the lead parameters via telemetry showed excellent values.  The entire system was then carefully inserted in the pocket with care been taking that the leads and device assumed a comfortable position without pressure on the incision. Great care was taken that the lead be located deep to the generator. The pocket was then closed in layers using 2 layers of 2-0 Vicryl and cutaneous staples, after which a sterile dressing was applied.  At the end of the procedure the following lead parameters were encountered:  Sensed R waves 10 mV, impedance 892 ohms, threshold 0.8 V at 0.4 ms pulse width.   Cc: Caprice Kluver, MD; Guerry Bruin, MD

## 2011-10-13 ENCOUNTER — Other Ambulatory Visit: Payer: Self-pay

## 2011-10-13 ENCOUNTER — Ambulatory Visit (HOSPITAL_COMMUNITY): Payer: Medicare Other

## 2011-10-13 DIAGNOSIS — R0600 Dyspnea, unspecified: Secondary | ICD-10-CM | POA: Diagnosis present

## 2011-10-13 DIAGNOSIS — N400 Enlarged prostate without lower urinary tract symptoms: Secondary | ICD-10-CM | POA: Insufficient documentation

## 2011-10-13 DIAGNOSIS — R0609 Other forms of dyspnea: Secondary | ICD-10-CM | POA: Diagnosis not present

## 2011-10-13 DIAGNOSIS — I428 Other cardiomyopathies: Secondary | ICD-10-CM

## 2011-10-13 DIAGNOSIS — G4733 Obstructive sleep apnea (adult) (pediatric): Secondary | ICD-10-CM | POA: Diagnosis present

## 2011-10-13 DIAGNOSIS — I2729 Other secondary pulmonary hypertension: Secondary | ICD-10-CM | POA: Diagnosis present

## 2011-10-13 DIAGNOSIS — Z7901 Long term (current) use of anticoagulants: Secondary | ICD-10-CM | POA: Diagnosis not present

## 2011-10-13 DIAGNOSIS — Z950 Presence of cardiac pacemaker: Secondary | ICD-10-CM | POA: Diagnosis not present

## 2011-10-13 DIAGNOSIS — R5381 Other malaise: Secondary | ICD-10-CM | POA: Diagnosis not present

## 2011-10-13 DIAGNOSIS — R5383 Other fatigue: Secondary | ICD-10-CM | POA: Diagnosis present

## 2011-10-13 DIAGNOSIS — I4819 Other persistent atrial fibrillation: Secondary | ICD-10-CM | POA: Diagnosis present

## 2011-10-13 DIAGNOSIS — G250 Essential tremor: Secondary | ICD-10-CM | POA: Diagnosis present

## 2011-10-13 DIAGNOSIS — R0989 Other specified symptoms and signs involving the circulatory and respiratory systems: Secondary | ICD-10-CM | POA: Diagnosis not present

## 2011-10-13 DIAGNOSIS — F32A Depression, unspecified: Secondary | ICD-10-CM | POA: Diagnosis present

## 2011-10-13 DIAGNOSIS — Z22322 Carrier or suspected carrier of Methicillin resistant Staphylococcus aureus: Secondary | ICD-10-CM

## 2011-10-13 DIAGNOSIS — G2581 Restless legs syndrome: Secondary | ICD-10-CM | POA: Diagnosis present

## 2011-10-13 DIAGNOSIS — I4891 Unspecified atrial fibrillation: Secondary | ICD-10-CM | POA: Diagnosis not present

## 2011-10-13 DIAGNOSIS — F329 Major depressive disorder, single episode, unspecified: Secondary | ICD-10-CM | POA: Diagnosis present

## 2011-10-13 HISTORY — DX: Presence of cardiac pacemaker: Z95.0

## 2011-10-13 MED ORDER — FUROSEMIDE 40 MG PO TABS
40.0000 mg | ORAL_TABLET | Freq: Once | ORAL | Status: AC
  Administered 2011-10-13: 40 mg via ORAL
  Filled 2011-10-13: qty 1

## 2011-10-13 MED ORDER — ACETAMINOPHEN 325 MG PO TABS: 325.0000 mg | ORAL_TABLET | ORAL | Status: AC | PRN

## 2011-10-13 MED ORDER — MUPIROCIN 2 % EX OINT: 1.0000 "application " | TOPICAL_OINTMENT | Freq: Two times a day (BID) | CUTANEOUS | Status: AC

## 2011-10-13 MED ORDER — FUROSEMIDE 20 MG PO TABS: 20.0000 mg | ORAL_TABLET | Freq: Every day | ORAL | Status: DC

## 2011-10-13 NOTE — Progress Notes (Signed)
Pt is refusing CPAP at this time. No complications noted.

## 2011-10-13 NOTE — Progress Notes (Signed)
Subjective:  No complaints  Objective:  Vital Signs in the last 24 hours: Temp:  [97.5 F (36.4 C)-98.5 F (36.9 C)] 98.5 F (36.9 C) (03/06 0500) Pulse Rate:  [50-61] 60  (03/06 0500) Resp:  [16-20] 20  (03/06 0500) BP: (104-152)/(59-80) 134/80 mmHg (03/06 0500) SpO2:  [94 %-97 %] 95 % (03/06 0500) Weight:  [111.6 kg (246 lb 0.5 oz)] 111.6 kg (246 lb 0.5 oz) (03/06 0500)  Intake/Output from previous day:  Intake/Output Summary (Last 24 hours) at 10/13/11 0915 Last data filed at 10/12/11 2200  Gross per 24 hour  Intake    450 ml  Output   2300 ml  Net  -1850 ml    Physical Exam: General appearance: alert, cooperative and no distress Lungs: clear to auscultation bilaterally Heart: regular rate and rhythm Pacer site without hematoma   Rate: 60  Rhythm: V paced  Lab Results: No results found for this basename: WBC:2,HGB:2,PLT:2 in the last 72 hours No results found for this basename: NA:2,K:2,CL:2,CO2:2,GLUCOSE:2,BUN:2,CREATININE:2 in the last 72 hours No results found for this basename: TROPONINI:2,CK,MB:2 in the last 72 hours Hepatic Function Panel No results found for this basename: PROT,ALBUMIN,AST,ALT,ALKPHOS,BILITOT,BILIDIR,IBILI in the last 72 hours No results found for this basename: CHOL in the last 72 hours  Basename 10/12/11 0740  INR 1.64*    Imaging: Dg Chest 2 View  10/13/2011  *RADIOLOGY REPORT*  Clinical Data: Post pacemaker  CHEST - 2 VIEW  Comparison: 11/04/2008  Findings: Left subclavian pacemaker lead, tip projects over right ventricle. Enlargement of cardiac silhouette. Calcified tortuous aorta. Minimal pulmonary vascular congestion. Mild peribronchial thickening. No pulmonary infiltrate, pleural effusion or pneumothorax. Eventration anterior right diaphragm. End plate spur formation thoracic spine.  IMPRESSION: No pneumothorax following left subclavian pacemaker insertion. Enlargement of cardiac silhouette with pulmonary vascular congestion.  Bronchitic changes.  Original Report Authenticated By: Lollie Marrow, M.D.    Cardiac Studies:  Assessment/Plan:   Principal Problem:  *Atrial fibrillation, persistent, slow VR Active Problems:  Pacemaker placed this admission  Fatigue  Dyspnea on exertion  Restless leg syndrome  Chronic anticoagulation  Normal coronary arteries, April 2010  Sleep apnea, on C-Pap  Tremor, hereditary, benign  Depression  Pulmonary hypertension  MRSA (methicillin resistant Staphylococcus aureus) carrier   Plan- discharge later today, I will see in one week for pacer site check.   Corine Shelter PA-C 10/13/2011, 9:15 AM    Agree with note written by Corine Shelter PAC  S/P PTVPM insertion. Exam benign. CXR ok. D/C home  Runell Gess 10/13/2011 11:36 AM

## 2011-10-13 NOTE — Progress Notes (Signed)
Reviewed discharge instructions with patient, wife, and son; they stated their understanding.  Also, reviewed pacemaker instructions and arm limitations.  Arm sling still on.  IV discontinued.  Patient discharged via wheelchair and volunteer.  Colman Cater

## 2011-10-13 NOTE — Discharge Summary (Signed)
Patient ID: [name],  MRN: [id number], DOB/AGE: 04-29-1932 78 y.o.  Admit date: 10/12/2011 Discharge date: 10/13/2011  Primary Care Provider: Dr Darral Dash Primary Cardiologist: Dr Clarene Duke  Discharge Diagnoses Principal Problem:  *Atrial fibrillation, persistent, slow VR Active Problems:  Pacemaker placed this admission, (BS)  Fatigue  Dyspnea on exertion  Restless leg syndrome  Chronic anticoagulation  Normal coronary arteries, April 2010  Sleep apnea, on C-Pap  Tremor, hereditary, benign  Depression  Pulmonary hypertension  MRSA (methicillin resistant Staphylococcus aureus) carrier    Procedures: Insertion of [employer] Pacemaker 10/12/11   Hospital Course: 78 y/o with a history of CAF with slow VR and symptoms of fatigue and DOE. He was admitted for elective pacemaker implant which was done 10/12/11 without complications. We feel he is stable for discharge 10/13/11. He will have a site check in one week and then follow up with his primary cardiologist in 3 mos.  Discharge Vitals:  Blood pressure 115/57, pulse 60, temperature 97.8 F (36.6 C), temperature source Oral, resp. rate 18, height 5\' 9"  (1.753 m), weight 111.6 kg (246 lb 0.5 oz), SpO2 92.00%.    Labs: Results for orders placed during the hospital encounter of 10/12/11 (from the past 48 hour(s))  PROTIME-INR     Status: Abnormal   Collection Time   10/12/11  7:40 AM      Component Value Range Comment   Prothrombin Time 19.7 (*) 11.6 - 15.2 (seconds)    INR 1.64 (*) 0.00 - 1.49    SURGICAL PCR SCREEN     Status: Abnormal   Collection Time   10/12/11  8:25 AM      Component Value Range Comment   MRSA, PCR POSITIVE (*) NEGATIVE     Staphylococcus aureus POSITIVE (*) NEGATIVE      Disposition:  Follow-up Information    Follow up with Abelino Derrick, PA. (office will call)    Contact information:   [address] [place] [address] [phone]          Discharge Medications:    Medication List  As of 10/13/2011 11:25 AM   TAKE these medications         acetaminophen 325 MG tablet   Commonly known as: TYLENOL   Take 1-2 tablets (325-650 mg total) by mouth every 4 (four) hours as needed.      dutasteride 0.5 MG capsule   Commonly known as: AVODART   Take 0.5 mg by mouth daily.      escitalopram 20 MG tablet   Commonly known as: LEXAPRO   Take 20 mg by mouth daily.      furosemide 20 MG tablet   Commonly known as: LASIX   Take 20 mg by mouth daily. swelling      losartan 100 MG tablet   Commonly known as: COZAAR   Take 100 mg by mouth daily.      mupirocin ointment 2 %   Commonly known as: BACTROBAN   Apply 1 application topically 2 (two) times daily.      nitroGLYCERIN 0.4 MG SL tablet   Commonly known as: NITROSTAT   Place 0.4 mg under the tongue every 5 (five) minutes as needed. Chest pain      potassium chloride SA 20 MEQ tablet   Commonly known as: K-DUR,KLOR-CON   Take 20 mEq by mouth daily.      sertraline 50 MG tablet   Commonly known as: ZOLOFT   Take 50 mg  by mouth daily.      STOOL SOFTENER PO   Take 3 tablets by mouth daily.      Tamsulosin HCl 0.4 MG Caps   Commonly known as: FLOMAX   Take 0.4 mg by mouth daily after breakfast.      traMADol 50 MG tablet   Commonly known as: ULTRAM   Take 50 mg by mouth every 6 (six) hours as needed. For pain      TRILIPIX 135 MG capsule   Generic drug: Choline Fenofibrate   Take 135 mg by mouth daily.      warfarin 5 MG tablet   Commonly known as: COUMADIN   Take 2.5-5 mg by mouth daily. Take one tablet every day except Monday, Wednesday, & Friday and take 2.5mg             Outstanding Labs/Studies  Duration of Discharge Encounter: Greater than 30 minutes including physician time.  Jolene Provost PA-C 10/13/2011 11:25 AM  Agree with note written by Corine Shelter [facility]  OK for D/C. S/P PTVPM insertion. CXR ok. ROV with Dr. Julieanne Cotton J 10/13/2011 11:35 AM

## 2011-10-22 DIAGNOSIS — I4891 Unspecified atrial fibrillation: Secondary | ICD-10-CM | POA: Diagnosis not present

## 2011-10-22 DIAGNOSIS — Z7901 Long term (current) use of anticoagulants: Secondary | ICD-10-CM | POA: Diagnosis not present

## 2011-10-27 DIAGNOSIS — Z7901 Long term (current) use of anticoagulants: Secondary | ICD-10-CM | POA: Diagnosis not present

## 2011-10-27 DIAGNOSIS — I4891 Unspecified atrial fibrillation: Secondary | ICD-10-CM | POA: Diagnosis not present

## 2011-12-09 DIAGNOSIS — I4891 Unspecified atrial fibrillation: Secondary | ICD-10-CM | POA: Diagnosis not present

## 2011-12-09 DIAGNOSIS — Z7901 Long term (current) use of anticoagulants: Secondary | ICD-10-CM | POA: Diagnosis not present

## 2011-12-28 DIAGNOSIS — H2510 Age-related nuclear cataract, unspecified eye: Secondary | ICD-10-CM | POA: Diagnosis not present

## 2011-12-28 DIAGNOSIS — H524 Presbyopia: Secondary | ICD-10-CM | POA: Diagnosis not present

## 2012-01-06 DIAGNOSIS — M722 Plantar fascial fibromatosis: Secondary | ICD-10-CM | POA: Diagnosis not present

## 2012-02-01 DIAGNOSIS — B351 Tinea unguium: Secondary | ICD-10-CM | POA: Diagnosis not present

## 2012-02-01 DIAGNOSIS — M79609 Pain in unspecified limb: Secondary | ICD-10-CM | POA: Diagnosis not present

## 2012-02-01 DIAGNOSIS — M722 Plantar fascial fibromatosis: Secondary | ICD-10-CM | POA: Diagnosis not present

## 2012-02-24 DIAGNOSIS — I10 Essential (primary) hypertension: Secondary | ICD-10-CM | POA: Diagnosis not present

## 2012-02-24 DIAGNOSIS — Z7901 Long term (current) use of anticoagulants: Secondary | ICD-10-CM | POA: Diagnosis not present

## 2012-02-24 DIAGNOSIS — E785 Hyperlipidemia, unspecified: Secondary | ICD-10-CM | POA: Diagnosis not present

## 2012-02-24 DIAGNOSIS — I2510 Atherosclerotic heart disease of native coronary artery without angina pectoris: Secondary | ICD-10-CM | POA: Diagnosis not present

## 2012-03-31 DIAGNOSIS — Z45018 Encounter for adjustment and management of other part of cardiac pacemaker: Secondary | ICD-10-CM | POA: Diagnosis not present

## 2012-03-31 DIAGNOSIS — I4891 Unspecified atrial fibrillation: Secondary | ICD-10-CM | POA: Diagnosis not present

## 2012-03-31 DIAGNOSIS — I498 Other specified cardiac arrhythmias: Secondary | ICD-10-CM | POA: Diagnosis not present

## 2012-04-06 DIAGNOSIS — I4891 Unspecified atrial fibrillation: Secondary | ICD-10-CM | POA: Diagnosis not present

## 2012-04-06 DIAGNOSIS — Z7901 Long term (current) use of anticoagulants: Secondary | ICD-10-CM | POA: Diagnosis not present

## 2012-05-17 DIAGNOSIS — I4891 Unspecified atrial fibrillation: Secondary | ICD-10-CM | POA: Diagnosis not present

## 2012-05-17 DIAGNOSIS — Z7901 Long term (current) use of anticoagulants: Secondary | ICD-10-CM | POA: Diagnosis not present

## 2012-05-18 DIAGNOSIS — M779 Enthesopathy, unspecified: Secondary | ICD-10-CM | POA: Diagnosis not present

## 2012-05-18 DIAGNOSIS — M069 Rheumatoid arthritis, unspecified: Secondary | ICD-10-CM | POA: Diagnosis not present

## 2012-05-31 DIAGNOSIS — I4891 Unspecified atrial fibrillation: Secondary | ICD-10-CM | POA: Diagnosis not present

## 2012-05-31 DIAGNOSIS — Z7901 Long term (current) use of anticoagulants: Secondary | ICD-10-CM | POA: Diagnosis not present

## 2012-06-14 DIAGNOSIS — Z23 Encounter for immunization: Secondary | ICD-10-CM | POA: Diagnosis not present

## 2012-06-15 DIAGNOSIS — Z79899 Other long term (current) drug therapy: Secondary | ICD-10-CM | POA: Diagnosis not present

## 2012-07-11 DIAGNOSIS — Z7901 Long term (current) use of anticoagulants: Secondary | ICD-10-CM | POA: Diagnosis not present

## 2012-07-11 DIAGNOSIS — I4891 Unspecified atrial fibrillation: Secondary | ICD-10-CM | POA: Diagnosis not present

## 2012-07-20 DIAGNOSIS — M779 Enthesopathy, unspecified: Secondary | ICD-10-CM | POA: Diagnosis not present

## 2012-08-24 DIAGNOSIS — Z125 Encounter for screening for malignant neoplasm of prostate: Secondary | ICD-10-CM | POA: Diagnosis not present

## 2012-08-24 DIAGNOSIS — Z7901 Long term (current) use of anticoagulants: Secondary | ICD-10-CM | POA: Diagnosis not present

## 2012-08-24 DIAGNOSIS — E785 Hyperlipidemia, unspecified: Secondary | ICD-10-CM | POA: Diagnosis not present

## 2012-08-24 DIAGNOSIS — I10 Essential (primary) hypertension: Secondary | ICD-10-CM | POA: Diagnosis not present

## 2012-08-25 DIAGNOSIS — I10 Essential (primary) hypertension: Secondary | ICD-10-CM | POA: Diagnosis not present

## 2012-09-05 DIAGNOSIS — Z0000 Encounter for general adult medical examination without abnormal findings: Secondary | ICD-10-CM | POA: Diagnosis not present

## 2012-09-05 DIAGNOSIS — E669 Obesity, unspecified: Secondary | ICD-10-CM | POA: Diagnosis not present

## 2012-09-05 DIAGNOSIS — I10 Essential (primary) hypertension: Secondary | ICD-10-CM | POA: Diagnosis not present

## 2012-09-05 DIAGNOSIS — I2510 Atherosclerotic heart disease of native coronary artery without angina pectoris: Secondary | ICD-10-CM | POA: Diagnosis not present

## 2012-09-05 DIAGNOSIS — Z125 Encounter for screening for malignant neoplasm of prostate: Secondary | ICD-10-CM | POA: Diagnosis not present

## 2012-09-15 DIAGNOSIS — Z1212 Encounter for screening for malignant neoplasm of rectum: Secondary | ICD-10-CM | POA: Diagnosis not present

## 2012-09-19 DIAGNOSIS — Z7901 Long term (current) use of anticoagulants: Secondary | ICD-10-CM | POA: Diagnosis not present

## 2012-09-19 DIAGNOSIS — I4891 Unspecified atrial fibrillation: Secondary | ICD-10-CM | POA: Diagnosis not present

## 2012-10-04 HISTORY — PX: PERMANENT PACEMAKER INSERTION: SHX6023

## 2012-10-05 DIAGNOSIS — M216X9 Other acquired deformities of unspecified foot: Secondary | ICD-10-CM | POA: Diagnosis not present

## 2012-10-15 ENCOUNTER — Emergency Department (HOSPITAL_BASED_OUTPATIENT_CLINIC_OR_DEPARTMENT_OTHER): Payer: Medicare Other

## 2012-10-15 ENCOUNTER — Emergency Department (HOSPITAL_BASED_OUTPATIENT_CLINIC_OR_DEPARTMENT_OTHER)
Admission: EM | Admit: 2012-10-15 | Discharge: 2012-10-16 | Disposition: A | Discharge status: Home / Self Care | Payer: Medicare Other | Attending: Emergency Medicine | Admitting: Emergency Medicine

## 2012-10-15 ENCOUNTER — Encounter (HOSPITAL_BASED_OUTPATIENT_CLINIC_OR_DEPARTMENT_OTHER): Payer: Self-pay | Admitting: *Deleted

## 2012-10-15 DIAGNOSIS — IMO0002 Reserved for concepts with insufficient information to code with codable children: Secondary | ICD-10-CM | POA: Diagnosis not present

## 2012-10-15 DIAGNOSIS — S0180XA Unspecified open wound of other part of head, initial encounter: Secondary | ICD-10-CM | POA: Diagnosis not present

## 2012-10-15 DIAGNOSIS — S60229A Contusion of unspecified hand, initial encounter: Secondary | ICD-10-CM | POA: Diagnosis not present

## 2012-10-15 DIAGNOSIS — I2510 Atherosclerotic heart disease of native coronary artery without angina pectoris: Secondary | ICD-10-CM | POA: Diagnosis not present

## 2012-10-15 DIAGNOSIS — W1789XA Other fall from one level to another, initial encounter: Secondary | ICD-10-CM | POA: Insufficient documentation

## 2012-10-15 DIAGNOSIS — Z79899 Other long term (current) drug therapy: Secondary | ICD-10-CM | POA: Insufficient documentation

## 2012-10-15 DIAGNOSIS — M79609 Pain in unspecified limb: Secondary | ICD-10-CM | POA: Diagnosis not present

## 2012-10-15 DIAGNOSIS — M109 Gout, unspecified: Secondary | ICD-10-CM | POA: Diagnosis not present

## 2012-10-15 DIAGNOSIS — W010XXA Fall on same level from slipping, tripping and stumbling without subsequent striking against object, initial encounter: Secondary | ICD-10-CM | POA: Insufficient documentation

## 2012-10-15 DIAGNOSIS — S6990XA Unspecified injury of unspecified wrist, hand and finger(s), initial encounter: Secondary | ICD-10-CM | POA: Diagnosis not present

## 2012-10-15 DIAGNOSIS — S60221A Contusion of right hand, initial encounter: Secondary | ICD-10-CM

## 2012-10-15 DIAGNOSIS — I252 Old myocardial infarction: Secondary | ICD-10-CM | POA: Insufficient documentation

## 2012-10-15 DIAGNOSIS — S80211A Abrasion, right knee, initial encounter: Secondary | ICD-10-CM

## 2012-10-15 DIAGNOSIS — Z7901 Long term (current) use of anticoagulants: Secondary | ICD-10-CM | POA: Insufficient documentation

## 2012-10-15 DIAGNOSIS — Z23 Encounter for immunization: Secondary | ICD-10-CM | POA: Insufficient documentation

## 2012-10-15 DIAGNOSIS — Z8719 Personal history of other diseases of the digestive system: Secondary | ICD-10-CM | POA: Diagnosis not present

## 2012-10-15 DIAGNOSIS — S0993XA Unspecified injury of face, initial encounter: Secondary | ICD-10-CM | POA: Diagnosis not present

## 2012-10-15 DIAGNOSIS — S0990XA Unspecified injury of head, initial encounter: Secondary | ICD-10-CM | POA: Diagnosis not present

## 2012-10-15 DIAGNOSIS — Y929 Unspecified place or not applicable: Secondary | ICD-10-CM | POA: Insufficient documentation

## 2012-10-15 DIAGNOSIS — Z96659 Presence of unspecified artificial knee joint: Secondary | ICD-10-CM | POA: Diagnosis not present

## 2012-10-15 DIAGNOSIS — R58 Hemorrhage, not elsewhere classified: Secondary | ICD-10-CM | POA: Insufficient documentation

## 2012-10-15 DIAGNOSIS — S0120XA Unspecified open wound of nose, initial encounter: Secondary | ICD-10-CM | POA: Diagnosis not present

## 2012-10-15 DIAGNOSIS — Y9301 Activity, walking, marching and hiking: Secondary | ICD-10-CM | POA: Insufficient documentation

## 2012-10-15 DIAGNOSIS — T45515A Adverse effect of anticoagulants, initial encounter: Secondary | ICD-10-CM | POA: Insufficient documentation

## 2012-10-15 HISTORY — DX: Gout, unspecified: M10.9

## 2012-10-15 HISTORY — DX: Acute myocardial infarction, unspecified: I21.9

## 2012-10-15 HISTORY — DX: Atherosclerotic heart disease of native coronary artery without angina pectoris: I25.10

## 2012-10-15 HISTORY — DX: Diverticulitis of intestine, part unspecified, without perforation or abscess without bleeding: K57.92

## 2012-10-15 MED ORDER — LIDOCAINE-EPINEPHRINE 2 %-1:100000 IJ SOLN
INTRAMUSCULAR | Status: AC
  Filled 2012-10-15: qty 1

## 2012-10-15 MED ORDER — TETANUS-DIPHTH-ACELL PERTUSSIS 5-2.5-18.5 LF-MCG/0.5 IM SUSP
0.5000 mL | Freq: Once | INTRAMUSCULAR | Status: AC
  Administered 2012-10-15: 0.5 mL via INTRAMUSCULAR
  Filled 2012-10-15: qty 0.5

## 2012-10-15 NOTE — ED Notes (Signed)
Pt tripped and fell in driveway. Approx 1-1/2 cm lac to forehead and bridge of nose. Bleeding controlled. PERL. No LOC.

## 2012-10-15 NOTE — ED Provider Notes (Addendum)
History  This chart was scribed for Hilario Quarry, MD by Shari Heritage, ED Scribe. The patient was seen in room MH08/MH08. Patient's care was started at 1758.   CSN: [id number]  Arrival date & time 10/15/12  1735   First MD Initiated Contact with Patient 10/15/12 1758      Chief Complaint  Patient presents with  . Fall     Patient is a 79 y.o. male presenting with fall. The history is provided by the patient. No language interpreter was used.  Fall The accident occurred less than 1 hour ago. The fall occurred while walking. He fell from a height of 3 to 5 ft. He landed on concrete. The volume of blood lost was minimal. The point of impact was the head. The pain is present in the head and neck. The pain is moderate. There was no entrapment after the fall. There was no drug use involved in the accident. There was no alcohol use involved in the accident. Pertinent negatives include no loss of consciousness.    HPI Comments: RAYMIR FROMMELT is a 79 y.o. male with history of coronary artery disease, MI, pacemaker, atrial fibrillation and perforated diverticulitis who presents to the Emergency Department after a mechanical fall that occurred immediately prior to arrival. Patient is complaining of lacerations to the mid forehead and right bridge of nose; moderate, constant head pain; and mild neck pain. Patient states that he tripped in his driveway and fell face forward onto concrete. He denies LOC. He states that he he required help to get up after the fall. Patient is currently on Coumadin. His last INR test was 5 weeks ago; level was 2.7. Patient has a surgical history of colostomy due to perforated diverticulitis in 2000; Surgeon was Dr. Claud Kelp. Tdap is out of date.  PCP - Linde Gillis  Past Medical History  Diagnosis Date  . Coronary artery disease   . MI (myocardial infarction)   . Diverticulitis   . Gout     Past Surgical History  Procedure Laterality Date  . Replacement  total knee    . Shoulder surgery    . Colostomy reversal    . Colostomy      History reviewed. No pertinent family history.  History  Substance Use Topics  . Smoking status: Never Smoker   . Smokeless tobacco: Not on file  . Alcohol Use: No      Review of Systems  HENT: Positive for neck pain.   Neurological: Negative for loss of consciousness.  All other systems reviewed and are negative.    Allergies  Hydrocodone  Home Medications   Current Outpatient Rx  Name  Route  Sig  Dispense  Refill  . Choline Fenofibrate (TRILIPIX) 135 MG capsule   Oral   Take 135 mg by mouth daily.         Tery Sanfilippo Calcium (STOOL SOFTENER PO)   Oral   Take 3 tablets by mouth daily.         [name] dutasteride (AVODART) 0.5 MG capsule   Oral   Take 0.5 mg by mouth daily.         [name] escitalopram (LEXAPRO) 20 MG tablet   Oral   Take 20 mg by mouth daily.         . furosemide (LASIX) 20 MG tablet   Oral   Take 20 mg by mouth daily. swelling         . losartan (COZAAR) 100 MG tablet  Oral   Take 100 mg by mouth daily.         . nitroGLYCERIN (NITROSTAT) 0.4 MG SL tablet   Sublingual   Place 0.4 mg under the tongue every 5 (five) minutes as needed. Chest pain         . potassium chloride SA (K-DUR,KLOR-CON) 20 MEQ tablet   Oral   Take 20 mEq by mouth daily.          . sertraline (ZOLOFT) 50 MG tablet   Oral   Take 50 mg by mouth daily.         . Tamsulosin HCl (FLOMAX) 0.4 MG CAPS   Oral   Take 0.4 mg by mouth daily after breakfast.         . traMADol (ULTRAM) 50 MG tablet   Oral   Take 50 mg by mouth every 6 (six) hours as needed. For pain         . warfarin (COUMADIN) 5 MG tablet   Oral   Take 2.5-5 mg by mouth daily. Take one tablet every day except Monday, Wednesday, & Friday and take 2.5mg            Triage Vitals: BP 127/68  Pulse 62  Temp(Src) 98.3 F (36.8 C) (Oral)  Resp 20  Ht 5' 9.5" (1.765 m)  Wt 250 lb (113.399 kg)  BMI 36.4  kg/m2  SpO2 95%  Physical Exam  Constitutional: He is oriented to person, place, and time. He appears well-developed and well-nourished.  HENT:  Head: Normocephalic. Head is with laceration.  6 cm stellate laceration to the mid forehead. 2 cm stellate laceration to bridge of nose.  Eyes: Conjunctivae and EOM are normal. Pupils are equal, round, and reactive to light.  Neck: Normal range of motion. Neck supple.  Cardiovascular: Normal rate, regular rhythm and normal heart sounds.   Pulmonary/Chest: Effort normal and breath sounds normal.  Abdominal: Soft. There is no tenderness.  Musculoskeletal:       Right hand: He exhibits tenderness.  Abrasion to the right knee that is nontender. Full active ROM of lower extremities.  Tenderness of the right hand at the base of the 1st metacarpal.  Neurological: He is alert and oriented to person, place, and time.  Skin: Skin is warm and dry.    ED Course  Procedures (including critical care time) DIAGNOSTIC STUDIES: Oxygen Saturation is 95% on room air, adequate by my interpretation.    COORDINATION OF CARE: 6:14 PM- Will order CT of head and neck, x-ray of right hand, and Tdap. Patient informed of current plan for treatment and evaluation and agrees with plan at this time.   6:18PM- Repaired laceration to mid forehead and bridge of nose. LACERATION REPAIR PROCEDURE NOTE The patient's identification was confirmed and consent was obtained. This procedure was performed by Hilario Quarry, MD at 6:18 PM. Site: mid forehead Sterile procedures observed: yes Anesthetic used (type and amt): lidocaine with epi 2%, 3 cc Suture type/size: 5-0 prolene Length: 6 cm # of Sutures: 5 Technique: simple, interrupted Antibx ointment applied Tetanus ordered Site anesthetized, irrigated with NS, explored without evidence of foreign body, wound well approximated, site covered with dry, sterile dressing.  Patient tolerated procedure well without  complications. Instructions for care discussed verbally and patient provided with additional written instructions for homecare and f/u.  LACERATION REPAIR PROCEDURE NOTE The patient's identification was confirmed and consent was obtained. This procedure was performed by Hilario Quarry, MD at 6:23 PM. Site:  2 cm bridge of nose Sterile procedures observed: yes Anesthetic used (type and amt): lidocaine with epi 2%, 1.5 cc Suture type/size: 5-0 prolene Length: 2 cm # of Sutures: 2 Technique: simple, interrupted Antibx ointment applied Tetanus ordered Site anesthetized, irrigated with NS, explored without evidence of foreign body, wound well approximated, site covered with dry, sterile dressing.  Patient tolerated procedure well without complications. Instructions for care discussed verbally and patient provided with additional written instructions for homecare and f/u.   Labs Reviewed  PROTIME-INR - Abnormal; Notable for the following:    Prothrombin Time 28.1 (*)    INR 2.80 (*)    All other components within normal limits    Ct Head Wo Contrast  10/15/2012  *RADIOLOGY REPORT*  Clinical Data:  Status post fall with a blow to the head and face.  CT HEAD WITHOUT CONTRAST CT CERVICAL SPINE WITHOUT CONTRAST  Technique:  Multidetector CT imaging of the head and cervical spine was performed following the standard protocol without intravenous contrast.  Multiplanar CT image reconstructions of the cervical spine were also generated.  Comparison:  Head CT scan 08/13/2004 and cervical spine MRI 07/23/2007.  CT HEAD  Findings: There is chronic microvascular ischemic change.  No evidence of acute intracranial abnormality including infarction, hemorrhage, mass lesion, mass effect, midline shift or abnormal extra-axial fluid collection.  There is no hydrocephalus or pneumocephalus.  Atherosclerosis is noted.  The calvarium is intact.  There appears to be a small laceration anterior to the frontal bone.   IMPRESSION:  1.  Likely laceration anterior to the frontal bone. 2.  No acute intracranial abnormality. 3.  Chronic microvascular ischemic change.  CT CERVICAL SPINE  Findings: The patient has multilevel degenerative disc disease and facet arthropathy.  No fracture or subluxation is identified.  Lung apices are clear.  IMPRESSION: No acute finding.  Multilevel degenerative change.   Original Report Authenticated By: Holley Dexter, M.D.    Ct Cervical Spine Wo Contrast  10/15/2012  *RADIOLOGY REPORT*  Clinical Data:  Status post fall with a blow to the head and face.  CT HEAD WITHOUT CONTRAST CT CERVICAL SPINE WITHOUT CONTRAST  Technique:  Multidetector CT imaging of the head and cervical spine was performed following the standard protocol without intravenous contrast.  Multiplanar CT image reconstructions of the cervical spine were also generated.  Comparison:  Head CT scan 08/13/2004 and cervical spine MRI 07/23/2007.  CT HEAD  Findings: There is chronic microvascular ischemic change.  No evidence of acute intracranial abnormality including infarction, hemorrhage, mass lesion, mass effect, midline shift or abnormal extra-axial fluid collection.  There is no hydrocephalus or pneumocephalus.  Atherosclerosis is noted.  The calvarium is intact.  There appears to be a small laceration anterior to the frontal bone.  IMPRESSION:  1.  Likely laceration anterior to the frontal bone. 2.  No acute intracranial abnormality. 3.  Chronic microvascular ischemic change.  CT CERVICAL SPINE  Findings: The patient has multilevel degenerative disc disease and facet arthropathy.  No fracture or subluxation is identified.  Lung apices are clear.  IMPRESSION: No acute finding.  Multilevel degenerative change.   Original Report Authenticated By: Holley Dexter, M.D.    Dg Hand Complete Right  10/15/2012  *RADIOLOGY REPORT*  Clinical Data: Fall, injury, pain at right thumb and at 4th/5th metacarpals  RIGHT HAND - COMPLETE 3+ VIEW   Comparison: Right wrist radiographs 08/13/2004  Findings: Diffuse osseous demineralization. Diffuse disc space narrowing of interphalangeal joints with slight gull-wing deformities of  the DIP joints of the index and middle fingers. No acute fracture, dislocation, or bone destruction. Fingers superimposed on lateral view limiting assessment.  IMPRESSION: Degenerative changes at scattered IP joints with potential erosive osteoarthritic changes at the DIP joints of the index and middle fingers. No acute fracture or dislocation identified.   Original Report Authenticated By: Ulyses Southward, M.D.      No diagnosis found.    MDM  This is a 79 year old male who tripped and fell on Coumadin. He had a mechanical fall with no reports of any signs or symptoms of syncope. He has a laceration to his face and to his nose which were repaired here. He is on Coumadin and his INR is 2.7. He had a head CT which does not show any intracranial bleeding. His right hand is tender over the first metacarpal. There is no sign of fracture on his plain x-rays. He has an abrasion of the right knee. He and his wife were given head injury instructions and advised to be very proactive if he has any signs of head injury such as increased pain in his head, lateralized weakness, or change in mental status and to return immediately due to his anti-coagulation status. He is advised to followup with his orthopedist of his right hand continues to cause pain. He is advised have stitches out in 5-7 days. Patient and wife voiced understanding of plan.  Hilario Quarry, MD 10/15/12 1955  I personally performed the services described in this documentation, which was scribed in my presence. The recorded information has been reviewed and considered.   Hilario Quarry, MD 10/15/12 [phone]

## 2012-10-19 DIAGNOSIS — Z4802 Encounter for removal of sutures: Secondary | ICD-10-CM | POA: Diagnosis not present

## 2012-10-19 DIAGNOSIS — S1093XA Contusion of unspecified part of neck, initial encounter: Secondary | ICD-10-CM | POA: Diagnosis not present

## 2012-10-19 DIAGNOSIS — S0003XA Contusion of scalp, initial encounter: Secondary | ICD-10-CM | POA: Diagnosis not present

## 2012-10-19 DIAGNOSIS — Z7901 Long term (current) use of anticoagulants: Secondary | ICD-10-CM | POA: Diagnosis not present

## 2012-11-22 DIAGNOSIS — I4891 Unspecified atrial fibrillation: Secondary | ICD-10-CM | POA: Diagnosis not present

## 2012-11-22 DIAGNOSIS — Z7901 Long term (current) use of anticoagulants: Secondary | ICD-10-CM | POA: Diagnosis not present

## 2013-01-02 DIAGNOSIS — I4891 Unspecified atrial fibrillation: Secondary | ICD-10-CM | POA: Diagnosis not present

## 2013-01-02 DIAGNOSIS — M25549 Pain in joints of unspecified hand: Secondary | ICD-10-CM | POA: Diagnosis not present

## 2013-01-02 DIAGNOSIS — Z7901 Long term (current) use of anticoagulants: Secondary | ICD-10-CM | POA: Diagnosis not present

## 2013-01-18 DIAGNOSIS — M19049 Primary osteoarthritis, unspecified hand: Secondary | ICD-10-CM | POA: Diagnosis not present

## 2013-01-18 DIAGNOSIS — M109 Gout, unspecified: Secondary | ICD-10-CM | POA: Diagnosis not present

## 2013-01-26 DIAGNOSIS — I8390 Asymptomatic varicose veins of unspecified lower extremity: Secondary | ICD-10-CM | POA: Diagnosis not present

## 2013-01-26 DIAGNOSIS — Z6836 Body mass index (BMI) 36.0-36.9, adult: Secondary | ICD-10-CM | POA: Diagnosis not present

## 2013-01-26 DIAGNOSIS — L0390 Cellulitis, unspecified: Secondary | ICD-10-CM | POA: Diagnosis not present

## 2013-02-13 DIAGNOSIS — Z7901 Long term (current) use of anticoagulants: Secondary | ICD-10-CM | POA: Diagnosis not present

## 2013-02-13 DIAGNOSIS — I4891 Unspecified atrial fibrillation: Secondary | ICD-10-CM | POA: Diagnosis not present

## 2013-02-23 DIAGNOSIS — H1130 Conjunctival hemorrhage, unspecified eye: Secondary | ICD-10-CM | POA: Diagnosis not present

## 2013-03-01 DIAGNOSIS — Z1331 Encounter for screening for depression: Secondary | ICD-10-CM | POA: Diagnosis not present

## 2013-03-01 DIAGNOSIS — I10 Essential (primary) hypertension: Secondary | ICD-10-CM | POA: Diagnosis not present

## 2013-03-01 DIAGNOSIS — Z950 Presence of cardiac pacemaker: Secondary | ICD-10-CM | POA: Diagnosis not present

## 2013-03-01 DIAGNOSIS — Z7901 Long term (current) use of anticoagulants: Secondary | ICD-10-CM | POA: Diagnosis not present

## 2013-03-01 DIAGNOSIS — I2510 Atherosclerotic heart disease of native coronary artery without angina pectoris: Secondary | ICD-10-CM | POA: Diagnosis not present

## 2013-03-01 DIAGNOSIS — I4891 Unspecified atrial fibrillation: Secondary | ICD-10-CM | POA: Diagnosis not present

## 2013-03-01 DIAGNOSIS — M109 Gout, unspecified: Secondary | ICD-10-CM | POA: Diagnosis not present

## 2013-03-01 DIAGNOSIS — E785 Hyperlipidemia, unspecified: Secondary | ICD-10-CM | POA: Diagnosis not present

## 2013-03-01 DIAGNOSIS — E669 Obesity, unspecified: Secondary | ICD-10-CM | POA: Diagnosis not present

## 2013-03-29 ENCOUNTER — Encounter: Payer: Self-pay | Admitting: *Deleted

## 2013-04-04 ENCOUNTER — Encounter: Payer: Self-pay | Admitting: Cardiovascular Disease

## 2013-04-04 ENCOUNTER — Ambulatory Visit (INDEPENDENT_AMBULATORY_CARE_PROVIDER_SITE_OTHER): Payer: Medicare Other | Admitting: Cardiovascular Disease

## 2013-04-04 VITALS — BP 142/64 | HR 63 | Resp 16 | Ht 70.0 in | Wt 245.2 lb

## 2013-04-04 DIAGNOSIS — I2510 Atherosclerotic heart disease of native coronary artery without angina pectoris: Secondary | ICD-10-CM

## 2013-04-04 DIAGNOSIS — G4730 Sleep apnea, unspecified: Secondary | ICD-10-CM

## 2013-04-04 DIAGNOSIS — Z950 Presence of cardiac pacemaker: Secondary | ICD-10-CM

## 2013-04-04 DIAGNOSIS — I4891 Unspecified atrial fibrillation: Secondary | ICD-10-CM

## 2013-04-04 DIAGNOSIS — Z0389 Encounter for observation for other suspected diseases and conditions ruled out: Secondary | ICD-10-CM

## 2013-04-04 DIAGNOSIS — I4819 Other persistent atrial fibrillation: Secondary | ICD-10-CM

## 2013-04-04 NOTE — Patient Instructions (Addendum)
Your physician recommends that you schedule a follow-up appointment in: One year.  

## 2013-04-09 ENCOUNTER — Encounter: Payer: Self-pay | Admitting: Cardiovascular Disease

## 2013-04-09 NOTE — Assessment & Plan Note (Addendum)
The etiology of his cardiomyopathy is uncertain. It does not appear to be a progressive disorder. If anything EF has improved from 45% in 2010-50-55% in 2012. Consider possibility of tachycardia-related cardiomyopathy if he had more rapid A. fib in the past  He is receiving angiotensin receptor blockers.  Beta blockers are probably do more harm than good, since it would increase the frequency of ventricular pacing even further. Ventricular dyssynchrony would probably outweigh any benefit of the beta blocker. In addition, since he has such prominent fatigue complaints these medications are not indicated.

## 2013-04-09 NOTE — Progress Notes (Signed)
Patient ID: [name], male   DOB: 1932-04-14, 79 y.o.   MRN: [id number]    Reason for office visit Atrial fibrillation, pacemaker followup  [name] is approaching 79 years old and returns in followup for atrial for bushel slow ventricular response status post single-chamber pacemaker implantation about a year and a half ago. Has borderline left ventricular systolic function with an ejection fraction estimated at 50% previous nuclear stress testing shows no evidence of ischemia or scar cardiac catheterization in 2010 showed normal coronary arteries and an ejection fraction of 40-45% with mildly elevated pulmonary artery wedge pressure and mild pulmonary arterial hypertension  His only complaint is fatigue. He denies chest pain but does feel "washed out" with activity. Has occasional ankle edema.    Allergies  Allergen Reactions  . Hydrocodone Nausea Only    Current Outpatient Prescriptions  Medication Sig Dispense Refill  . Choline Fenofibrate (TRILIPIX) 135 MG capsule Take 135 mg by mouth daily.      Tery Sanfilippo Calcium (STOOL SOFTENER PO) Take 3 tablets by mouth daily.      [name] dutasteride (AVODART) 0.5 MG capsule Take 0.5 mg by mouth daily.      [name] escitalopram (LEXAPRO) 20 MG tablet Take 20 mg by mouth daily.      . furosemide (LASIX) 20 MG tablet Take 20 mg by mouth daily. swelling      . losartan (COZAAR) 100 MG tablet Take 100 mg by mouth daily.      . nitroGLYCERIN (NITROSTAT) 0.4 MG SL tablet Place 0.4 mg under the tongue every 5 (five) minutes as needed. Chest pain      . potassium chloride SA (K-DUR,KLOR-CON) 20 MEQ tablet Take 20 mEq by mouth daily.       . sertraline (ZOLOFT) 50 MG tablet Take 50 mg by mouth daily.      . Tamsulosin HCl (FLOMAX) 0.4 MG CAPS Take 0.4 mg by mouth daily after breakfast.      . traMADol (ULTRAM) 50 MG tablet Take 50 mg by mouth every 6 (six) hours as needed. For pain      . warfarin (COUMADIN) 5 MG tablet Take 2.5-5 mg by mouth daily. Take  one tablet every day except Monday, Wednesday, & Friday and take 2.5mg       . allopurinol (ZYLOPRIM) 300 MG tablet Take 300 mg by mouth daily.      [name] COLCRYS 0.6 MG tablet Take 0.6 mg by mouth as needed.       No current facility-administered medications for this visit.    Past Medical History  Diagnosis Date  . Coronary artery disease   . MI (myocardial infarction)   . Diverticulitis   . Gout   . Permanent atrial fibrillation   . Nonischemic cardiomyopathy     mild  . OSA on CPAP   . Hyperlipidemia   . Pulmonary hypertension     Past Surgical History  Procedure Laterality Date  . Replacement total knee    . Shoulder surgery    . Colostomy reversal    . Colostomy    . Coronary angioplasty  06/01/1999    successful to ostium of the first diagonal  . Cardiac catheterization  11/07/2008    nonischemic cardiomyopathy,pulmonary hypertension  . US echocardiography  02/01/2011    LA is mod-severely dilated,AOV & root sclerotic,ca+ AOV leaflets  . Nm myocar perf wall motion  11/24/2007    normal  . Permanent pacemaker insertion  10/04/2012    [place]  [facility]    Family History  Problem Relation Age of Onset  . Cancer Mother   . Heart attack Father   . Diabetes Brother     History   Social History  . Marital Status: Married    Spouse Name: N/A    Number of Children: N/A  . Years of Education: N/A   Occupational History  . Not on file.   Social History Main Topics  . Smoking status: Never Smoker   . Smokeless tobacco: Not on file  . Alcohol Use: No  . Drug Use: No  . Sexual Activity: Not on file   Other Topics Concern  . Not on file   Social History Narrative  . No narrative on file    Review of systems: The patient specifically denies any chest pain at rest or with exertion, dyspnea at rest, orthopnea, paroxysmal nocturnal dyspnea, syncope, palpitations, focal neurological deficits, intermittent claudication, lower extremity edema, unexplained weight gain,  cough, hemoptysis or wheezing.  The patient also denies abdominal pain, nausea, vomiting, dysphagia, diarrhea, constipation, polyuria, polydipsia, dysuria, hematuria, frequency, urgency, abnormal bleeding or bruising, fever, chills, unexpected weight changes, mood swings, change in skin or hair texture, change in voice quality, auditory or visual problems, allergic reactions or rashes, new musculoskeletal complaints other than usual "aches and pains".   PHYSICAL EXAM BP 142/64  Pulse 63  Resp 16  Ht 5\' 10"  (1.778 m)  Wt 245 lb 3.2 oz (111.222 kg)  BMI 35.18 kg/m2  General: Alert, oriented x3, no distress, moderately obese Head: no evidence of trauma, PERRL, EOMI, no exophtalmos or lid lag, no myxedema, no xanthelasma; normal ears, nose and oropharynx Neck: normal jugular venous pulsations and no hepatojugular reflux; brisk carotid pulses without delay and no carotid bruits Chest: clear to auscultation, no signs of consolidation by percussion or palpation, normal fremitus, symmetrical and full respiratory excursions; healthy left subclavian pacemaker site Cardiovascular: normal position and quality of the apical impulse, regular rhythm, normal first and second heart sounds, no rubs or gallops, grade 1/6 also thought murmur at left lower sternal border Abdomen: no tenderness or distention, no masses by palpation, no abnormal pulsatility or arterial bruits, normal bowel sounds, no hepatosplenomegaly Extremities: no clubbing, cyanosis or edema; 2+ radial, ulnar and brachial pulses bilaterally; 2+ right femoral, posterior tibial and dorsalis pedis pulses; 2+ left femoral, posterior tibial and dorsalis pedis pulses; no subclavian or femoral bruits Neurological: grossly nonfocal   EKG: Background atrial fibrillation, 100% ventricular pacing  Lipid Panel     Component Value Date/Time   CHOL  Value: 173        ATP III CLASSIFICATION:  <200     mg/dL   Desirable  098-119  mg/dL   Borderline High   >=147    mg/dL   High        04/06/5620 1857   TRIG 126 11/04/2008 1857   HDL 37* 11/04/2008 1857   CHOLHDL 4.7 11/04/2008 1857   VLDL 25 11/04/2008 1857   LDLCALC  Value: 111        Total Cholesterol/HDL:CHD Risk Coronary Heart Disease Risk Table                     Men   Women  1/2 Average Risk   3.4   3.3  Average Risk       5.0   4.4  2 X Average Risk   9.6   7.1  3 X Average Risk  23.4  11.0        Use the calculated Patient Ratio above and the CHD Risk Table to determine the patient's CHD Risk.        ATP III CLASSIFICATION (LDL):  <100     mg/dL   Optimal  213-086  mg/dL   Near or Above                    Optimal  130-159  mg/dL   Borderline  578-469  mg/dL   High  >629     mg/dL   Very High* 01/04/4131 1857    BMET    Component Value Date/Time   NA 140 11/08/2008 0413   K 4.7 11/08/2008 0413   CL 108 11/08/2008 0413   CO2 26 11/08/2008 0413   GLUCOSE 80 11/08/2008 0413   BUN 24* 11/08/2008 0413   CREATININE 1.04 11/08/2008 0413   CALCIUM 8.8 11/08/2008 0413   GFRNONAA >60 11/08/2008 0413   GFRAA  Value: >60        The eGFR has been calculated using the MDRD equation. This calculation has not been validated in all clinical situations. eGFR's persistently <60 mL/min signify possible Chronic Kidney Disease. 11/08/2008 0413     ASSESSMENT AND PLAN Atrial fibrillation, persistent, slow VR On warfarin anticoagulation without any embolic events, neurological complaints or bleeding complications.   Pacemaker single chamber, [employer], 2013 Complete interrogation is performed on this visit. Excellent lead parameters. Battery still essentially at beginning of life. Ventricular pacing occurs 67% of the time. There is a very favorable heart rate distribution by histogram. This suggests that his fatigue is not heart rate related. Alternative etiologies for fatigue might need to be evaluated, such as anemia, hypothyroidism,  Sleep apnea, on C-Pap Compliance with CPAP is reported as being good. This  is important since poor compliance with therapy might be a cause for persistent fatigue and lack of energy  Nonischemic cardiomyopathy - coronaries by angiography 2010 The etiology of his cardiomyopathy is uncertain. It does not appear to be a progressive disorder. If anything EF has improved from 45% in 2010-50-55% in 2012. Consider possibility of tachycardia-related cardiomyopathy if he had more rapid A. fib in the past  He is receiving angiotensin receptor blockers.  Beta blockers are probably do more harm than good, since it would increase the frequency of ventricular pacing even further. Ventricular dyssynchrony would probably outweigh any benefit of the beta blocker. In addition, since he has such prominent fatigue complaints these medications are not indicated.   Orders Placed This Encounter  Procedures  . EKG 12-Lead   Meds ordered this encounter  Medications  . allopurinol (ZYLOPRIM) 300 MG tablet    Sig: Take 300 mg by mouth daily.  [name] COLCRYS 0.6 MG tablet    Sig: Take 0.6 mg by mouth as needed.    Junious Silk, MD, [facility] [phone] office [phone] pager

## 2013-04-09 NOTE — Assessment & Plan Note (Signed)
Compliance with CPAP is reported as being good. This is important since poor compliance with therapy might be a cause for persistent fatigue and lack of energy

## 2013-04-09 NOTE — Assessment & Plan Note (Signed)
On warfarin anticoagulation without any embolic events, neurological complaints or bleeding complications.

## 2013-04-09 NOTE — Assessment & Plan Note (Signed)
Complete interrogation is performed on this visit. Excellent lead parameters. Battery still essentially at beginning of life. Ventricular pacing occurs 67% of the time. There is a very favorable heart rate distribution by histogram. This suggests that his fatigue is not heart rate related. Alternative etiologies for fatigue might need to be evaluated, such as anemia, hypothyroidism,

## 2013-04-25 DIAGNOSIS — Z7901 Long term (current) use of anticoagulants: Secondary | ICD-10-CM | POA: Diagnosis not present

## 2013-04-25 DIAGNOSIS — I4891 Unspecified atrial fibrillation: Secondary | ICD-10-CM | POA: Diagnosis not present

## 2013-05-18 DIAGNOSIS — Z7901 Long term (current) use of anticoagulants: Secondary | ICD-10-CM | POA: Diagnosis not present

## 2013-05-18 DIAGNOSIS — M19049 Primary osteoarthritis, unspecified hand: Secondary | ICD-10-CM | POA: Diagnosis not present

## 2013-06-06 DIAGNOSIS — Z7901 Long term (current) use of anticoagulants: Secondary | ICD-10-CM | POA: Diagnosis not present

## 2013-08-01 ENCOUNTER — Ambulatory Visit (INDEPENDENT_AMBULATORY_CARE_PROVIDER_SITE_OTHER): Payer: Medicare Other | Admitting: *Deleted

## 2013-08-01 DIAGNOSIS — I4891 Unspecified atrial fibrillation: Secondary | ICD-10-CM | POA: Diagnosis not present

## 2013-08-01 DIAGNOSIS — I4819 Other persistent atrial fibrillation: Secondary | ICD-10-CM

## 2013-08-01 LAB — MDC_IDC_ENUM_SESS_TYPE_INCLINIC
Brady Statistic RV Percent Paced: 70 %
Implantable Pulse Generator Serial Number: 113290
Lead Channel Pacing Threshold Amplitude: 0.9 V
Lead Channel Pacing Threshold Pulse Width: 0.4 ms
Lead Channel Setting Pacing Amplitude: 1.4 V
Lead Channel Setting Sensing Sensitivity: 2.5 mV

## 2013-08-01 LAB — PACEMAKER DEVICE OBSERVATION

## 2013-08-01 NOTE — Patient Instructions (Signed)
Remote monitoring is used to monitor your pacemaker from home. This monitoring reduces the number of office visits required to check your device to one time per year. It allows [place] to keep an eye on the functioning of your device to ensure it is working properly. You are scheduled for a device check from home on 11-01-2013. You may send your transmission at any time that day. If you have a wireless device, the transmission will be sent automatically. After your physician reviews your transmission, you will receive a postcard with your next transmission date.  Your physician recommends that you schedule a follow-up appointment in: August 2015 with  Dr.Croitoru

## 2013-08-03 NOTE — Progress Notes (Signed)
Pacemaker check in clinic. Normal device function. Threshold, sensing, and impedance consistent with previous measurements. Device programmed to maximize longevity. Permanent AF + Warfarin. No high ventricular rates noted. Device programmed at appropriate safety margins. Histogram distribution appropriate for patient activity level. Device programmed to optimize intrinsic conduction. Estimated longevity 9 years. Patient will follow up remotely on 11-01-2013 and with [facility] in August 2015.

## 2013-08-15 ENCOUNTER — Other Ambulatory Visit: Payer: Self-pay | Admitting: Gastroenterology

## 2013-08-15 DIAGNOSIS — K222 Esophageal obstruction: Secondary | ICD-10-CM | POA: Diagnosis not present

## 2013-08-15 DIAGNOSIS — R1310 Dysphagia, unspecified: Secondary | ICD-10-CM | POA: Diagnosis not present

## 2013-08-15 NOTE — Addendum Note (Signed)
Addended byClarene Essex on: 08/15/2013 04:02 PM   Modules accepted: Orders

## 2013-08-16 ENCOUNTER — Telehealth: Payer: Self-pay | Admitting: *Deleted

## 2013-08-16 NOTE — Telephone Encounter (Signed)
Signed cardiac clearance to Hold coumadin 4 days before endoscopy and restart ASAP.

## 2013-08-22 ENCOUNTER — Ambulatory Visit (HOSPITAL_COMMUNITY)
Admission: RE | Admit: 2013-08-22 | Discharge: 2013-08-22 | Disposition: A | Discharge status: Home / Self Care | Payer: Medicare Other | Source: Ambulatory Visit | Attending: Gastroenterology | Admitting: Gastroenterology

## 2013-08-22 ENCOUNTER — Encounter (HOSPITAL_COMMUNITY)
Admission: RE | Disposition: A | Discharge status: Home / Self Care | Payer: Self-pay | Source: Ambulatory Visit | Attending: Gastroenterology

## 2013-08-22 ENCOUNTER — Other Ambulatory Visit (HOSPITAL_COMMUNITY): Payer: Self-pay | Admitting: Gastroenterology

## 2013-08-22 ENCOUNTER — Encounter (HOSPITAL_COMMUNITY): Payer: Self-pay | Admitting: Gastroenterology

## 2013-08-22 DIAGNOSIS — K222 Esophageal obstruction: Secondary | ICD-10-CM

## 2013-08-22 DIAGNOSIS — R52 Pain, unspecified: Secondary | ICD-10-CM | POA: Insufficient documentation

## 2013-08-22 DIAGNOSIS — R1310 Dysphagia, unspecified: Secondary | ICD-10-CM | POA: Insufficient documentation

## 2013-08-22 HISTORY — PX: ESOPHAGOGASTRODUODENOSCOPY: SHX5428

## 2013-08-22 HISTORY — PX: SAVORY DILATION: SHX5439

## 2013-08-22 LAB — PROTIME-INR
INR: 1.42 (ref 0.00–1.49)
Prothrombin Time: 17 seconds — ABNORMAL HIGH (ref 11.6–15.2)

## 2013-08-22 SURGERY — EGD (ESOPHAGOGASTRODUODENOSCOPY)
Anesthesia: Moderate Sedation

## 2013-08-22 MED ORDER — FENTANYL CITRATE 0.05 MG/ML IJ SOLN
INTRAMUSCULAR | Status: AC
  Filled 2013-08-22: qty 2

## 2013-08-22 MED ORDER — FENTANYL CITRATE 0.05 MG/ML IJ SOLN
INTRAMUSCULAR | Status: DC | PRN
  Administered 2013-08-22: 10 ug via INTRAVENOUS
  Administered 2013-08-22 (×2): 25 ug via INTRAVENOUS

## 2013-08-22 MED ORDER — MIDAZOLAM HCL 5 MG/ML IJ SOLN
INTRAMUSCULAR | Status: AC
  Filled 2013-08-22: qty 2

## 2013-08-22 MED ORDER — SODIUM CHLORIDE 0.9 % IV SOLN
INTRAVENOUS | Status: DC
  Administered 2013-08-22: 12:00:00 via INTRAVENOUS

## 2013-08-22 MED ORDER — MIDAZOLAM HCL 10 MG/2ML IJ SOLN
INTRAMUSCULAR | Status: DC | PRN
  Administered 2013-08-22 (×2): 2 mg via INTRAVENOUS
  Administered 2013-08-22: 1 mg via INTRAVENOUS

## 2013-08-22 MED ORDER — BUTAMBEN-TETRACAINE-BENZOCAINE 2-2-14 % EX AERO
INHALATION_SPRAY | CUTANEOUS | Status: DC | PRN
  Administered 2013-08-22: 2 via TOPICAL

## 2013-08-22 NOTE — Op Note (Signed)
[facility], [address]   ENDOSCOPY PROCEDURE REPORT  PATIENT: [name], [name]  MR#: [id number] BIRTHDATE: 1932/03/22 , 81  yrs. old GENDER: Male  ENDOSCOPIST: Clarene Essex, MD REFERRED CH:ENIDPOE [name], M.D.  PROCEDURE DATE:  08/22/2013 PROCEDURE:   Savary dilation of esophagus ASA CLASS:   Class III INDICATIONS:Dysphagia.  MEDICATIONS: Fentanyl 60 mcg IV and Versed 5 mg IV  TOPICAL ANESTHETIC:used  DESCRIPTION OF PROCEDURE:   After the risks benefits and alternatives of the procedure were thoroughly explained, informed consent was obtained.  The Pentax Gastroscope [id number]  endoscope was introduced through the mouth and advanced to the second portion of the duodenum , limited by Without limitations.   The instrument was slowly withdrawn as the mucosa was fully examined.the findings are recorded below and there was a upper esophageal sphincter ring which was dilated with passing the scopeand the rest of the esophagus was normaland after the endoscopy was completed we proceeded with the Savary dilatation under fluoroscopy guidance which took 3 trys because the initial Savary wire was bent and we could not advance the dilator over it and then we tried with the Jagwire but was not stiff enough because we initially could not find another Savary wire however once that was found the 15 mm dilator was passed under further guidance without resistance and the wire and dilator were removed in tandem and the patient tolerated the procedure well there was no obvious immediate complication       FINDINGS:upper esophageal sphincter ring dilated with scope and Savary 15 mm dilator under fluoroscopy 2 small gastric AVM 3 otherwise within normal limits EGD  COMPLICATIONS: none  ENDOSCOPIC IMPRESSION:above  RECOMMENDATIONS:see how dilation works resume Coumadin tomorrow if okaycall me if needed otherwise followup in one  month   REPEAT EXAM: as needed   _______________________________ Clarene Essex, MD eSigned:  Clarene Essex, MD 08/22/2013 1:20 PM    UM:PNTIRWE [name], MD  PATIENT NAME:  Masen, Luallen MR#: [id number]

## 2013-08-22 NOTE — Discharge Instructions (Addendum)
Call if question or problem or if swallowing no better in one week and if doing well may resume Coumadin tomorrowModerate Sedation, Adult Moderate sedation is given to help you relax or even sleep through a procedure. You may remain sleepy, be clumsy, or have poor balance for several hours following this procedure. Arrange for a responsible adult, family member, or friend to take you home. A responsible adult should stay with you for at least 24 hours or until the medicines have worn off.  Do not participate in any activities where you could become injured for the next 24 hours, or until you feel normal again. Do not:  Drive.  Swim.  Ride a bicycle.  Operate heavy machinery.  Cook.  Use power tools.  Climb ladders.  Work at [employer].  Do not make important decisions or sign legal documents until you are improved.  Vomiting may occur if you eat too soon. When you can drink without vomiting, try water, juice, or soup. Try solid foods if you feel little or no nausea.  Only take over-the-counter or prescription medications for pain, discomfort, or fever as directed by your caregiver.If pain medications have been prescribed for you, ask your caregiver how soon it is safe to take them.  Make sure you and your family fully understands everything about the medication given to you. Make sure you understand what side effects may occur.  You should not drink alcohol, take sleeping pills, or medications that cause drowsiness for at least 24 hours.  If you smoke, do not smoke alone.  If you are feeling better, you may resume normal activities 24 hours after receiving sedation.  Keep all appointments as scheduled. Follow all instructions.  Ask questions if you do not understand. SEEK MEDICAL CARE IF:   Your skin is pale or bluish in color.  You continue to feel sick to your stomach (nauseous) or throw up (vomit).  Your pain is getting worse and not helped by medication.  You have  bleeding or swelling.  You are still sleepy or feeling clumsy after 24 hours. SEEK IMMEDIATE MEDICAL CARE IF:   You develop a rash.  You have difficulty breathing.  You develop any type of allergic problem.  You have a fever. Document Released: 04/20/2001 Document Revised: 10/18/2011 Document Reviewed: 04/02/2013 [facility] Patient Information 2014 [place].

## 2013-08-23 ENCOUNTER — Encounter (HOSPITAL_COMMUNITY): Payer: Self-pay | Admitting: Gastroenterology

## 2013-09-04 DIAGNOSIS — I4891 Unspecified atrial fibrillation: Secondary | ICD-10-CM | POA: Diagnosis not present

## 2013-09-04 DIAGNOSIS — Z7901 Long term (current) use of anticoagulants: Secondary | ICD-10-CM | POA: Diagnosis not present

## 2013-09-04 DIAGNOSIS — I2510 Atherosclerotic heart disease of native coronary artery without angina pectoris: Secondary | ICD-10-CM | POA: Diagnosis not present

## 2013-09-04 DIAGNOSIS — M109 Gout, unspecified: Secondary | ICD-10-CM | POA: Diagnosis not present

## 2013-09-04 DIAGNOSIS — Z125 Encounter for screening for malignant neoplasm of prostate: Secondary | ICD-10-CM | POA: Diagnosis not present

## 2013-09-04 DIAGNOSIS — I10 Essential (primary) hypertension: Secondary | ICD-10-CM | POA: Diagnosis not present

## 2013-09-04 DIAGNOSIS — E781 Pure hyperglyceridemia: Secondary | ICD-10-CM | POA: Diagnosis not present

## 2013-09-11 DIAGNOSIS — N138 Other obstructive and reflux uropathy: Secondary | ICD-10-CM | POA: Diagnosis not present

## 2013-09-11 DIAGNOSIS — N401 Enlarged prostate with lower urinary tract symptoms: Secondary | ICD-10-CM | POA: Diagnosis not present

## 2013-09-11 DIAGNOSIS — E785 Hyperlipidemia, unspecified: Secondary | ICD-10-CM | POA: Diagnosis not present

## 2013-09-11 DIAGNOSIS — Z0000 Encounter for general adult medical examination without abnormal findings: Secondary | ICD-10-CM | POA: Diagnosis not present

## 2013-09-11 DIAGNOSIS — I2510 Atherosclerotic heart disease of native coronary artery without angina pectoris: Secondary | ICD-10-CM | POA: Diagnosis not present

## 2013-09-11 DIAGNOSIS — I10 Essential (primary) hypertension: Secondary | ICD-10-CM | POA: Diagnosis not present

## 2013-09-11 DIAGNOSIS — E669 Obesity, unspecified: Secondary | ICD-10-CM | POA: Diagnosis not present

## 2013-09-11 DIAGNOSIS — G2581 Restless legs syndrome: Secondary | ICD-10-CM | POA: Diagnosis not present

## 2013-09-11 DIAGNOSIS — I4891 Unspecified atrial fibrillation: Secondary | ICD-10-CM | POA: Diagnosis not present

## 2013-09-19 DIAGNOSIS — Z1212 Encounter for screening for malignant neoplasm of rectum: Secondary | ICD-10-CM | POA: Diagnosis not present

## 2013-10-01 DIAGNOSIS — H2510 Age-related nuclear cataract, unspecified eye: Secondary | ICD-10-CM | POA: Diagnosis not present

## 2013-10-01 DIAGNOSIS — H524 Presbyopia: Secondary | ICD-10-CM | POA: Diagnosis not present

## 2013-10-01 DIAGNOSIS — D2310 Other benign neoplasm of skin of unspecified eyelid, including canthus: Secondary | ICD-10-CM | POA: Diagnosis not present

## 2013-10-01 DIAGNOSIS — H02059 Trichiasis without entropian unspecified eye, unspecified eyelid: Secondary | ICD-10-CM | POA: Diagnosis not present

## 2013-10-03 DIAGNOSIS — Z7901 Long term (current) use of anticoagulants: Secondary | ICD-10-CM | POA: Diagnosis not present

## 2013-10-03 DIAGNOSIS — I4891 Unspecified atrial fibrillation: Secondary | ICD-10-CM | POA: Diagnosis not present

## 2013-10-04 ENCOUNTER — Encounter: Payer: Medicare Other | Admitting: Cardiovascular Disease

## 2013-10-05 DIAGNOSIS — G2581 Restless legs syndrome: Secondary | ICD-10-CM | POA: Diagnosis not present

## 2013-10-05 DIAGNOSIS — Z0000 Encounter for general adult medical examination without abnormal findings: Secondary | ICD-10-CM | POA: Diagnosis not present

## 2013-10-05 DIAGNOSIS — I4891 Unspecified atrial fibrillation: Secondary | ICD-10-CM | POA: Diagnosis not present

## 2013-10-05 DIAGNOSIS — I2510 Atherosclerotic heart disease of native coronary artery without angina pectoris: Secondary | ICD-10-CM | POA: Diagnosis not present

## 2013-10-05 DIAGNOSIS — I10 Essential (primary) hypertension: Secondary | ICD-10-CM | POA: Diagnosis not present

## 2013-10-05 DIAGNOSIS — E785 Hyperlipidemia, unspecified: Secondary | ICD-10-CM | POA: Diagnosis not present

## 2013-10-05 DIAGNOSIS — E669 Obesity, unspecified: Secondary | ICD-10-CM | POA: Diagnosis not present

## 2013-10-05 DIAGNOSIS — N401 Enlarged prostate with lower urinary tract symptoms: Secondary | ICD-10-CM | POA: Diagnosis not present

## 2013-10-08 DIAGNOSIS — D2310 Other benign neoplasm of skin of unspecified eyelid, including canthus: Secondary | ICD-10-CM | POA: Diagnosis not present

## 2013-10-25 ENCOUNTER — Encounter: Payer: Medicare Other | Admitting: Cardiovascular Disease

## 2013-10-30 ENCOUNTER — Telehealth: Payer: Self-pay | Admitting: Cardiovascular Disease

## 2013-10-30 NOTE — Telephone Encounter (Signed)
New Message:  Pt's wife is asking for a device tech to call with instructions on how to send in a remote transmission

## 2013-10-30 NOTE — Telephone Encounter (Signed)
Verbal instructions given

## 2013-11-01 ENCOUNTER — Telehealth: Payer: Self-pay | Admitting: Cardiovascular Disease

## 2013-11-01 ENCOUNTER — Encounter: Payer: Medicare Other | Admitting: *Deleted

## 2013-11-01 ENCOUNTER — Telehealth: Payer: Self-pay | Admitting: *Deleted

## 2013-11-01 NOTE — Telephone Encounter (Signed)
800# given 

## 2013-11-01 NOTE — Telephone Encounter (Signed)
New Message:  Pt is requesting a call back from our device dept to instruct on how to send a remote transmission.

## 2013-11-01 NOTE — Telephone Encounter (Signed)
Pt's wife was calling in regards to [name]'s remote check. She stated that he pushed the button this morning and he is still being hooked up to it. She wants to know if something went wrong with the transmission.  [place]

## 2013-11-05 ENCOUNTER — Encounter: Payer: Self-pay | Admitting: Cardiovascular Disease

## 2013-11-05 ENCOUNTER — Ambulatory Visit (INDEPENDENT_AMBULATORY_CARE_PROVIDER_SITE_OTHER): Payer: Medicare Other | Admitting: Cardiovascular Disease

## 2013-11-05 VITALS — BP 130/64 | HR 62 | Ht 68.0 in | Wt 254.5 lb

## 2013-11-05 DIAGNOSIS — I428 Other cardiomyopathies: Secondary | ICD-10-CM | POA: Diagnosis not present

## 2013-11-05 DIAGNOSIS — I4891 Unspecified atrial fibrillation: Secondary | ICD-10-CM

## 2013-11-05 DIAGNOSIS — Z950 Presence of cardiac pacemaker: Secondary | ICD-10-CM

## 2013-11-05 DIAGNOSIS — I4819 Other persistent atrial fibrillation: Secondary | ICD-10-CM

## 2013-11-05 DIAGNOSIS — G4730 Sleep apnea, unspecified: Secondary | ICD-10-CM | POA: Diagnosis not present

## 2013-11-05 DIAGNOSIS — R413 Other amnesia: Secondary | ICD-10-CM

## 2013-11-05 LAB — PACEMAKER DEVICE OBSERVATION

## 2013-11-05 NOTE — Patient Instructions (Addendum)
Remote monitoring is used to monitor your pacemaker from home. This monitoring reduces the number of office visits required to check your device to one time per year. It allows [place] to keep an eye on the functioning of your device to ensure it is working properly. You are scheduled for a device check from home on 02-06-2014. You may send your transmission at any time that day. If you have a wireless device, the transmission will be sent automatically. After your physician reviews your transmission, you will receive a postcard with your next transmission date.  A referral has been made to Dr. Shelva Majestic to treat your Sleep Apnea for the [facility].  Your physician recommends that you schedule a follow-up appointment in: One Year with Dr. [name].

## 2013-11-05 NOTE — Progress Notes (Signed)
Patient ID: [name], male   DOB: [birth year], 80 y.o.   MRN: [id number]     Reason for office visit: atrial fibrillation, pacemaker check, OSA  [name] is  80 years old and returns in followup for atrial fibrillation with slow ventricular response status post single-chamber pacemaker implantation about 2 years ago. Has borderline left ventricular systolic function with an ejection fraction estimated at 50%. Aprevious nuclear stress testing shows no evidence of ischemia or scar. Cardiac catheterization in 2010 showed normal coronary arteries and an ejection fraction of 40-45% with mildly elevated pulmonary artery wedge pressure and mild pulmonary arterial hypertension.  He has obstructive sleep apnea and uses CPAP faithfully every night. Despite this he complains of profound fatigue. He wakes up feeling tired. Simple daily tasks make him feel exhausted. He has fallen asleep on several occasions including while standing up. He has ankle swelling.  He also complains of being more absent minded and recurrent problems with his memory. He frequently misplaces things. He has sometimes misplaces medications. He bought a bag of tangerines the other day and has forgotten where he put it.   With primary care physician, [name] Kitchen [name], just last month but did not mention his memory problems at that time. He had lab tests which were reportedly "good".  He complains about his deteriorating relationship with his wife. She complains about his memory and about the fact that he cannot take care of the household chores. He feels overwhelmed with her requests. She spends most of the day in the chair or electric scooter due to mobility problems. She hurts his feelings a lot bye "calling him names".  Pacemaker interrogation shows normal device function with a favorable heart rate histogram distribution (no evidence of chronotropic incompetence). Ventricular pacing occurs 70% of the time.    Allergies  Allergen  Reactions  . Hydrocodone Nausea Only  . Vicodin [Hydrocodone-Acetaminophen] Nausea And Vomiting    Current Outpatient Prescriptions  Medication Sig Dispense Refill  . allopurinol (ZYLOPRIM) 300 MG tablet Take 300 mg by mouth daily.      . Choline Fenofibrate (TRILIPIX) 135 MG capsule Take 135 mg by mouth daily.      [name] COLCRYS 0.6 MG tablet Take 0.6 mg by mouth as needed.      [name] Calcium (STOOL SOFTENER PO) Take 3 tablets by mouth daily.      [name] dutasteride (AVODART) 0.5 MG capsule Take 0.5 mg by mouth daily.      [name] escitalopram (LEXAPRO) 20 MG tablet Take 20 mg by mouth daily.      . furosemide (LASIX) 20 MG tablet Take 20 mg by mouth daily. swelling      . losartan (COZAAR) 100 MG tablet Take 100 mg by mouth daily.      . Multiple Vitamin (MULTIVITAMIN WITH MINERALS) TABS tablet Take 1 tablet by mouth daily.      . nitroGLYCERIN (NITROSTAT) 0.4 MG SL tablet Place 0.4 mg under the tongue every 5 (five) minutes as needed. Chest pain      . potassium chloride SA (K-DUR,KLOR-CON) 20 MEQ tablet Take 20 mEq by mouth daily.       . sertraline (ZOLOFT) 50 MG tablet Take 50 mg by mouth daily.      . Tamsulosin HCl (FLOMAX) 0.4 MG CAPS Take 0.4 mg by mouth daily after breakfast.      . traMADol (ULTRAM) 50 MG tablet Take 50 mg by mouth every 6 (six) hours as needed. For pain      .  warfarin (COUMADIN) 5 MG tablet Take 2.5-5 mg by mouth daily. Take one tablet every day except Monday, Wednesday, & Friday and take 2.44m       No current facility-administered medications for this visit.    Past Medical History  Diagnosis Date  . Coronary artery disease   . MI (myocardial infarction)   . Diverticulitis   . Gout   . Permanent atrial fibrillation   . Nonischemic cardiomyopathy     mild  . OSA on CPAP   . Hyperlipidemia   . Pulmonary hypertension     Past Surgical History  Procedure Laterality Date  . Replacement total knee    . Shoulder surgery    . Colostomy reversal    .  Colostomy    . Coronary angioplasty  06/01/1999    successful to ostium of the first diagonal  . Cardiac catheterization  11/07/2008    nonischemic cardiomyopathy,pulmonary hypertension  . [place]oreaechocardiography  02/01/2011    LA is mod-severely dilated,AOV & root sclerotic,ca+ AOV leaflets  . Nm myocar perf wall motion  11/24/2007    normal  . Permanent pacemaker insertion  10/04/2012    [employer] . Esophagogastroduodenoscopy N/A 08/22/2013    Procedure: ESOPHAGOGASTRODUODENOSCOPY (EGD);  Surgeon: MJeryl Columbia MD;  [facility];  Service: Endoscopy;  Laterality: N/A;  h/p in file cabinet, jackie  . Savory dilation N/A 08/22/2013    Procedure: SAVORY DILATION;  Surgeon: MJeryl Columbia MD;  [facility];  Service: Endoscopy;  Laterality: N/A;    Family History  Problem Relation Age of Onset  . Cancer Mother   . Heart attack Father   . Diabetes Brother     History   Social History  . Marital Status: Married    Spouse Name: N/A    Number of Children: N/A  . Years of Education: N/A   Occupational History  . Not on file.   Social History Main Topics  . Smoking status: Never Smoker   . Smokeless tobacco: Not on file  . Alcohol Use: No  . Drug Use: No  . Sexual Activity: Not on file   Other Topics Concern  . Not on file   Social History Narrative  . No narrative on file    Review of systems: The patient specifically denies any chest pain at rest or with exertion, dyspnea at rest, orthopnea, paroxysmal nocturnal dyspnea, syncope, palpitations, focal neurological deficits, intermittent claudication, lower extremity edema, unexplained weight gain, cough, hemoptysis or wheezing.  The patient also denies abdominal pain, nausea, vomiting, dysphagia, diarrhea, constipation, polyuria, polydipsia, dysuria, hematuria, frequency, urgency, abnormal bleeding or bruising, fever, chills, unexpected weight changes, mood swings, change in skin or hair texture, change in  voice quality, auditory or visual problems, allergic reactions or rashes, new musculoskeletal complaints other than usual "aches and pains".   PHYSICAL EXAM BP 130/64  Pulse 62  Ht _0  (1.727 m)  Wt 115.44 kg (254 lb 8 oz)  BMI 38.71 kg/m2 General: Alert, oriented x3, no distress, moderately obese  Head: no evidence of trauma, PERRL, EOMI, no exophtalmos or lid lag, no myxedema, no xanthelasma; normal ears, nose and oropharynx  Neck: normal jugular venous pulsations and no hepatojugular reflux; brisk carotid pulses without delay and no carotid bruits  Chest: clear to auscultation, no signs of consolidation by percussion or palpation, normal fremitus, symmetrical and full respiratory excursions; healthy left subclavian pacemaker site  Cardiovascular: normal position and quality of the apical impulse, regular  rhythm, normal first and paradoxically split second heart sounds, no rubs or gallops, grade 1/6 systolic murmur at left lower sternal border  Abdomen: no tenderness or distention, no masses by palpation, no abnormal pulsatility or arterial bruits, normal bowel sounds, no hepatosplenomegaly  Extremities: Prominent bilateral varicose veins of the lower extremities; no clubbing, cyanosis or edema; 2+ radial, ulnar and brachial pulses bilaterally; 2+ right femoral, posterior tibial and dorsalis pedis pulses; 2+ left femoral, posterior tibial and dorsalis pedis pulses; no subclavian or femoral bruits  Neurological: grossly nonfocal   EKG: Atrial fibrillation, 100% ventricular pacing  Lipid Panel     Component Value Date/Time   CHOL  Value: 173        ATP III CLASSIFICATION:  <200     mg/dL   Desirable  200-239  mg/dL   Borderline High  >=240    mg/dL   High        11/04/2008 1857   TRIG 126 11/04/2008 1857   HDL 37* 11/04/2008 1857   CHOLHDL 4.7 11/04/2008 1857   VLDL 25 11/04/2008 1857   LDLCALC  Value: 111        Total Cholesterol/HDL:CHD Risk Coronary Heart Disease Risk Table                      Men   Women  1/2 Average Risk   3.4   3.3  Average Risk       5.0   4.4  2 X Average Risk   9.6   7.1  3 X Average Risk  23.4   11.0        Use the calculated Patient Ratio above and the CHD Risk Table to determine the patient's CHD Risk.        ATP III CLASSIFICATION (LDL):  <100     mg/dL   Optimal  100-129  mg/dL   Near or Above                    Optimal  130-159  mg/dL   Borderline  160-189  mg/dL   High  >190     mg/dL   Very High* 11/04/2008 1857    BMET    Component Value Date/Time   NA 140 11/08/2008 0413   K 4.7 11/08/2008 0413   CL 108 11/08/2008 0413   CO2 26 11/08/2008 0413   GLUCOSE 80 11/08/2008 0413   BUN 24* 11/08/2008 0413   CREATININE 1.04 11/08/2008 0413   CALCIUM 8.8 11/08/2008 0413   GFRNONAA >60 11/08/2008 0413   GFRAA  Value: >60        The eGFR has been calculated using the MDRD equation. This calculation has not been validated in all clinical situations. eGFR's persistently <60 mL/min signify possible Chronic Kidney Disease. 11/08/2008 0413     ASSESSMENT AND PLAN Atrial fibrillation, persistent, slow VR 70% ventricular paced. Normal pacemaker function. On appropriate anticoagulation with warfarin. No bleeding complications and no embolic events, strokes/TIA.  Pacemaker single chamber, [employer], 2013 Comprehensive check in the office today shows normal device function. No permanent reprogramming changes performed today.  Nonischemic cardiomyopathy - coronaries by angiography 2010 No overt findings of hypervolemia by exam today. Symptoms did not sound immediately indicative of congestive heart failure.  Sleep apnea, on C-Pap His daytime sleepiness and inappropriate sleep episodes, profound fatigue upon waking in the morning, his difficulty in concentrating and memory problems might actually be symptoms of untreated sleep apnea. It  is possible that there is a problem with his CPAP device or at settings, since he describes being 100% compliant with treatment.  Would like him to do a download from his device and have this reviewed by Dr. Claiborne Billings, schedule him for an appointment in the sleep clinic.  Poor short term memory If repeat evaluation and treatment of his sleep apnea does not improve this problem, consider a Mini-Mental status examination to evaluate for dementia.   Orders Placed This Encounter  Procedures  . Ambulatory referral to Cardiology  . EKG 12-Lead   No orders of the defined types were placed in this encounter.    Holli Humbles, MD, [place] [phone] office [phone] pager

## 2013-11-05 NOTE — Assessment & Plan Note (Signed)
If repeat evaluation and treatment of his sleep apnea does not improve this problem, consider a Mini-Mental status examination to evaluate for dementia.

## 2013-11-05 NOTE — Assessment & Plan Note (Signed)
70% ventricular paced. Normal pacemaker function. On appropriate anticoagulation with warfarin. No bleeding complications and no embolic events, strokes/TIA.

## 2013-11-05 NOTE — Assessment & Plan Note (Signed)
Comprehensive check in the office today shows normal device function. No permanent reprogramming changes performed today.

## 2013-11-05 NOTE — Assessment & Plan Note (Signed)
No overt findings of hypervolemia by exam today. Symptoms did not sound immediately indicative of congestive heart failure.

## 2013-11-05 NOTE — Assessment & Plan Note (Signed)
His daytime sleepiness and inappropriate sleep episodes, profound fatigue upon waking in the morning, his difficulty in concentrating and memory problems might actually be symptoms of untreated sleep apnea. It is possible that there is a problem with his CPAP device or at settings, since he describes being 100% compliant with treatment. Would like him to do a download from his device and have this reviewed by Dr. Claiborne Billings, schedule him for an appointment in the sleep clinic.

## 2013-11-08 ENCOUNTER — Telehealth: Payer: Self-pay | Admitting: *Deleted

## 2013-11-08 LAB — MDC_IDC_ENUM_SESS_TYPE_INCLINIC
Date Time Interrogation Session: 20150330040000
Implantable Pulse Generator Serial Number: 113290
Lead Channel Impedance Value: 703 Ohm
Lead Channel Pacing Threshold Pulse Width: 0.4 ms
Lead Channel Sensing Intrinsic Amplitude: 14.9 mV
Lead Channel Setting Pacing Amplitude: 1.3 V
Lead Channel Setting Pacing Pulse Width: 0.4 ms
MDC IDC MSMT LEADCHNL RV PACING THRESHOLD AMPLITUDE: 0.8 V
MDC IDC SET LEADCHNL RV SENSING SENSITIVITY: 2.5 mV
MDC IDC STAT BRADY RV PERCENT PACED: 70 %
Zone Setting Detection Interval: 375 ms

## 2013-11-08 NOTE — Telephone Encounter (Signed)
Faxed referral order with records and sleep studies to [facility].

## 2013-11-13 ENCOUNTER — Telehealth: Payer: Self-pay | Admitting: *Deleted

## 2013-12-10 NOTE — Telephone Encounter (Signed)
Encounter Closed---12/10/13 TP 

## 2014-01-11 ENCOUNTER — Encounter: Payer: Self-pay | Admitting: Cardiovascular Disease

## 2014-01-11 ENCOUNTER — Ambulatory Visit (INDEPENDENT_AMBULATORY_CARE_PROVIDER_SITE_OTHER): Payer: Medicare Other | Admitting: Cardiovascular Disease

## 2014-01-11 VITALS — BP 131/67 | HR 59 | Ht 69.5 in | Wt 252.7 lb

## 2014-01-11 DIAGNOSIS — G4730 Sleep apnea, unspecified: Secondary | ICD-10-CM | POA: Diagnosis not present

## 2014-01-11 DIAGNOSIS — I428 Other cardiomyopathies: Secondary | ICD-10-CM

## 2014-01-11 DIAGNOSIS — Z7901 Long term (current) use of anticoagulants: Secondary | ICD-10-CM | POA: Diagnosis not present

## 2014-01-11 NOTE — Patient Instructions (Signed)
Your physician recommends that you schedule a follow-up appointment in: 6 months.  You will be referred to [facility] for your sleep management.

## 2014-01-11 NOTE — Progress Notes (Signed)
Patient ID: [name], male   DOB: [birth year], 80 y.o.   MRN: [id number]     HPI: [name] is a 80 y.o. male who is followed by Dr.Croitoru for his cardiology care.  He now presents for further evaluation of his obstructive sleep apnea.  [name] has history of long-standing bradycardia due to atrial fibrillation with a slow ventricular response for which he received a permanent pacemaker approximately 2 years ago.  He has been on Coumadin for chronic anticoagulation.  He has a history of a nonischemic myopathy.  In July 2010.  He underwent a sleep study and was found to have severe obstructive sleep apnea with an AHI of 59 per hour and an RDI of 71.6 per hour.  He was initially titrated up to 16 cm water pressure on a CPAP study.  The patient use to have supplies from [facility] were no longer in business.  He has a RemStar Pro CPAP unit.  MRI.  Fracture nasal mask.  He admits to 100% compliance.  He also has restless legs.  I had seen him in 2011 at which time recommended a slow titration of record.  Apparently this had never been done, but recently he was started on record by Dr. Rosana Hoes.It does not appear that his dose was ever titrated and he believes he may be on a 0.25 mg dose.  He is in need for referral to a new [employer].  He denies chest pain.  He is unaware of tachycardia palpitations.  His wife is unaware of breakthrough snoring.  He still has some mild residual daytime sleepiness.  He believes his restless legs is slightly improved since initiating" per   Epworth Sleepiness Scale: Situation   Chance of Dozing/Sleeping (0 = never , 1 = slight chance , 2 = moderate chance , 3 = high chance )   sitting and reading 3   watching TV 2   sitting inactive in a public place 2   being a passenger in a motor vehicle for an hour or more 0   lying down in the afternoon 3   sitting and talking to someone 0   sitting quietly after lunch (no alcohol) 3   while stopped  for a few minutes in traffic as the driver 0   Total Score  13    Past Medical History  Diagnosis Date  . Coronary artery disease   . MI (myocardial infarction)   . Diverticulitis   . Gout   . Permanent atrial fibrillation   . Nonischemic cardiomyopathy     mild  . OSA on CPAP   . Hyperlipidemia   . Pulmonary hypertension     Past Surgical History  Procedure Laterality Date  . Replacement total knee    . Shoulder surgery    . Colostomy reversal    . Colostomy    . Coronary angioplasty  06/01/1999    successful to ostium of the first diagonal  . Cardiac catheterization  11/07/2008    nonischemic cardiomyopathy,pulmonary hypertension  . US echocardiography  02/01/2011    LA is mod-severely dilated,AOV & root sclerotic,ca+ AOV leaflets  . Nm myocar perf wall motion  11/24/2007    normal  . Permanent pacemaker insertion  10/04/2012    [employer]  . Esophagogastroduodenoscopy N/A 08/22/2013    Procedure: ESOPHAGOGASTRODUODENOSCOPY (EGD);  Surgeon: [name], MD;  Location: [facility] END[facility]COPY;  Service: Endoscopy;  Laterality: N/A;  h/p in file cabinet, jackie  .  Savory dilation N/A 08/22/2013    Procedure: SAVORY DILATION;  Surgeon: [name], MD;  Location: [facility]OSCOPY;  Service: Endoscopy;  Laterality: N/A;    Allergies  Allergen Reactions  . Hydrocodone Nausea Only  . Vicodin [Hydrocodone-Acetaminophen] Nausea And Vomiting    Current Outpatient Prescriptions  Medication Sig Dispense Refill  . allopurinol (ZYLOPRIM) 300 MG tablet Take 300 mg by mouth daily.      . Choline Fenofibrate (TRILIPIX) 135 MG capsule Take 135 mg by mouth daily.      [name] COLCRYS 0.6 MG tablet Take 0.6 mg by mouth as needed.      [name] Calcium (STOOL SOFTENER PO) Take 3 tablets by mouth daily.      [name] dutasteride (AVODART) 0.5 MG capsule Take 0.5 mg by mouth daily.      [name] escitalopram (LEXAPRO) 20 MG tablet Take 20 mg by mouth daily.      . furosemide (LASIX) 20 MG tablet Take 20 mg by  mouth daily. swelling      . losartan (COZAAR) 100 MG tablet Take 100 mg by mouth daily.      . Multiple Vitamin (MULTIVITAMIN WITH MINERALS) TABS tablet Take 1 tablet by mouth daily.      . nitroGLYCERIN (NITROSTAT) 0.4 MG SL tablet Place 0.4 mg under the tongue every 5 (five) minutes as needed. Chest pain      . potassium chloride SA (K-DUR,KLOR-CON) 20 MEQ tablet Take 20 mEq by mouth daily.       . sertraline (ZOLOFT) 50 MG tablet Take 50 mg by mouth daily.      . Tamsulosin HCl (FLOMAX) 0.4 MG CAPS Take 0.4 mg by mouth daily after breakfast.      . traMADol (ULTRAM) 50 MG tablet Take 50 mg by mouth every 6 (six) hours as needed. For pain      . warfarin (COUMADIN) 5 MG tablet Take 2.5-5 mg by mouth daily. Take one tablet every day except Monday, Wednesday, & Friday and take 2.56m       No current facility-administered medications for this visit.    History   Social History  . Marital Status: Married    Spouse Name: N/A    Number of Children: N/A  . Years of Education: N/A   Occupational History  . Not on file.   Social History Main Topics  . Smoking status: Never Smoker   . Smokeless tobacco: Never Used  . Alcohol Use: No  . Drug Use: No  . Sexual Activity: Not on file   Other Topics Concern  . Not on file   Social History Narrative  . No narrative on file     ROS General: Negative; No fevers, chills, or night sweats HEENT: Negative; No changes in vision or hearing, sinus congestion, difficulty swallowing Pulmonary: Negative; No cough, wheezing, shortness of breath, hemoptysis Cardiovascular: Positive for atrial fibrillation and permanent pacemaker; No chest pain, presyncope, syncope, palpatations GI: Negative; No nausea, vomiting, diarrhea, or abdominal pain GU: Negative; No dysuria, hematuria, or difficulty voiding Musculoskeletal: Negative; no myalgias, joint pain, or weakness Hematologic: Negative; no easy bruising, bleeding Endocrine: Negative; no heat/cold  intolerance Neuro: Negative; no changes in balance, headaches Skin: Negative; No rashes or skin lesions Psychiatric: Negative; No behavioral problems, depression Sleep: See history of present illness: Mild hypersomnolence, restless legs, no bruxism,hypnogognic hallucinations, no cataplexy   Physical Exam BP 131/67  Pulse 59  Ht 5' 9.5" (1.765 m)  Wt 252 lb 11.2 oz ((161.096  kg)  BMI 36.79 kg/m2  General: Alert, oriented, no distress.  Skin: normal turgor, no rashes HEENT: Normocephalic, atraumatic. Pupils round and reactive; sclera anicteric; extraocular muscles intact; Fundi arterial narrowing. Nose without nasal septal hypertrophy Mouth/Parynx benign; Mallinpatti scale 3 Neck: No JVD, no carotid briuts Lungs: clear to ausculatation and percussion; no wheezing or rales  Chest wall: No tenderness to palpation Heart: RRR, s1 s2 normal 1/6 systolic murmur Abdomen: Mild obesity; soft, nontender; no hepatosplenomehaly, BS+; abdominal aorta nontender and not dilated by palpation. Back: No CVA tenderness Pulses 2+ Extremities: no clubbinbg cyanosis or edema, Homan's sign negative  Neurologic: grossly nonfocal; cranial nerves intact. Psychological: Normal affect and mood.    LABS:  BMET    Component Value Date/Time   NA 140 11/08/2008 0413   K 4.7 11/08/2008 0413   CL 108 11/08/2008 0413   CO2 26 11/08/2008 0413   GLUCOSE 80 11/08/2008 0413   BUN 24* 11/08/2008 0413   CREATININE 1.04 11/08/2008 0413   CALCIUM 8.8 11/08/2008 0413   GFRNONAA >60 11/08/2008 0413   GFRAA  Value: >60        The eGFR has been calculated using the MDRD equation. This calculation has not been validated in all clinical situations. eGFR's persistently <60 mL/min signify possible Chronic Kidney Disease. 11/08/2008 0413     Hepatic Function Panel     Component Value Date/Time   PROT 6.6 11/04/2008 1857   ALBUMIN 3.5 11/04/2008 1857   AST 27 11/04/2008 1857   ALT 22 11/04/2008 1857   ALKPHOS 39 11/04/2008 1857   BILITOT  0.6 11/04/2008 1857     CBC    Component Value Date/Time   WBC 6.8 11/08/2008 0413   RBC 4.20* 11/08/2008 0413   HGB 13.9 11/08/2008 0413   HCT 40.2 11/08/2008 0413   PLT 192 11/08/2008 0413   MCV 95.7 11/08/2008 0413   MCHC 34.5 11/08/2008 0413   RDW 14.8 11/08/2008 0413   LYMPHSABS 1.7 11/04/2008 1857   MONOABS 0.6 11/04/2008 1857   EOSABS 0.1 11/04/2008 1857   BASOSABS 0.0 11/04/2008 1857     BNP    Component Value Date/Time   PROBNP 464.0* 11/04/2008 1857    Lipid Panel     Component Value Date/Time   CHOL  Value: 173        ATP III CLASSIFICATION:  <200     mg/dL   Desirable  200-239  mg/dL   Borderline High  >=240    mg/dL   High        11/04/2008 1857   TRIG 126 11/04/2008 1857   HDL 37* 11/04/2008 1857   CHOLHDL 4.7 11/04/2008 1857   VLDL 25 11/04/2008 1857   LDLCALC  Value: 111        Total Cholesterol/HDL:CHD Risk Coronary Heart Disease Risk Table                     Men   Women  1/2 Average Risk   3.4   3.3  Average Risk       5.0   4.4  2 X Average Risk   9.6   7.1  3 X Average Risk  23.4   11.0        Use the calculated Patient Ratio above and the CHD Risk Table to determine the patient's CHD Risk.        ATP III CLASSIFICATION (LDL):  <100     mg/dL   Optimal  100-129  mg/dL   Near or Above                    Optimal  130-159  mg/dL   Borderline  160-189  mg/dL   High  >190     mg/dL   Very High* 11/04/2008 1857     RADIOLOGY: No results found.    ASSESSMENT AND PLAN: Mr. Burt Piatek has documented severe obstructive sleep apnea by his initial diagnostic polysomnogram in July 2010.  He has been on CPAP therapy since the fall of 2010 with his initial download in October demonstrating compliance.  He admits to using the CPAP with 100% compliance.  I am referring [facility] to [facility] for his new supply company.  He still is a mild residual daytime sleepiness.  He does have restless legs and was just started on for a low-dose Requip, which may be able to be titrated for symptoms.  His  blood pressure today is controlled on his losartan 100 mg, Lasix, 20 mg therapy.  Has not had any bleeding issues on warfarin.  Once he is referred to [facility], we will obtain a download, and adjustments to his settings will be made if necessary.  I did discuss with him that next year.  He may be a candidate for a Glass blower/designer dependent upon its functional status.  I will see him in 6 months for reevaluation.    Troy Sine, MD, [facility]  01/11/2014 5:42 PM

## 2014-01-14 ENCOUNTER — Telehealth: Payer: Self-pay | Admitting: *Deleted

## 2014-01-14 NOTE — Telephone Encounter (Signed)
Faxed referral along with sleep studies, office notes, insurance information and demographics to [facility].

## 2014-02-06 ENCOUNTER — Telehealth: Payer: Self-pay | Admitting: Cardiology

## 2014-02-06 ENCOUNTER — Encounter: Payer: Medicare Other | Admitting: *Deleted

## 2014-02-06 NOTE — Telephone Encounter (Signed)
LMOVM reminding pt to send remote transmission.   

## 2014-02-07 ENCOUNTER — Encounter: Payer: Self-pay | Admitting: Cardiology

## 2014-02-21 ENCOUNTER — Ambulatory Visit (INDEPENDENT_AMBULATORY_CARE_PROVIDER_SITE_OTHER): Payer: Medicare Other | Admitting: *Deleted

## 2014-02-21 ENCOUNTER — Encounter: Payer: Self-pay | Admitting: Cardiovascular Disease

## 2014-02-22 DIAGNOSIS — I4891 Unspecified atrial fibrillation: Secondary | ICD-10-CM | POA: Diagnosis not present

## 2014-02-22 NOTE — Progress Notes (Signed)
Remote pacemaker transmission.   

## 2014-03-01 LAB — MDC_IDC_ENUM_SESS_TYPE_INCLINIC
Brady Statistic RV Percent Paced: 70 %
Implantable Pulse Generator Serial Number: 113290
Lead Channel Impedance Value: 732 Ohm
Lead Channel Pacing Threshold Amplitude: 0.9 V
Lead Channel Pacing Threshold Pulse Width: 0.4 ms
Lead Channel Sensing Intrinsic Amplitude: 15.6 mV
Lead Channel Setting Pacing Amplitude: 1.3 V
MDC IDC SET LEADCHNL RV PACING PULSEWIDTH: 0.4 ms
MDC IDC SET LEADCHNL RV SENSING SENSITIVITY: 2.5 mV
Zone Setting Detection Interval: 375 ms

## 2014-03-06 ENCOUNTER — Encounter: Payer: Self-pay | Admitting: Cardiology

## 2014-03-14 DIAGNOSIS — M109 Gout, unspecified: Secondary | ICD-10-CM | POA: Diagnosis not present

## 2014-03-14 DIAGNOSIS — N401 Enlarged prostate with lower urinary tract symptoms: Secondary | ICD-10-CM | POA: Diagnosis not present

## 2014-03-14 DIAGNOSIS — R809 Proteinuria, unspecified: Secondary | ICD-10-CM | POA: Diagnosis not present

## 2014-03-14 DIAGNOSIS — E785 Hyperlipidemia, unspecified: Secondary | ICD-10-CM | POA: Diagnosis not present

## 2014-03-14 DIAGNOSIS — I10 Essential (primary) hypertension: Secondary | ICD-10-CM | POA: Diagnosis not present

## 2014-03-14 DIAGNOSIS — Z7901 Long term (current) use of anticoagulants: Secondary | ICD-10-CM | POA: Diagnosis not present

## 2014-03-14 DIAGNOSIS — R0609 Other forms of dyspnea: Secondary | ICD-10-CM | POA: Diagnosis not present

## 2014-03-14 DIAGNOSIS — I4891 Unspecified atrial fibrillation: Secondary | ICD-10-CM | POA: Diagnosis not present

## 2014-03-14 DIAGNOSIS — I2510 Atherosclerotic heart disease of native coronary artery without angina pectoris: Secondary | ICD-10-CM | POA: Diagnosis not present

## 2014-03-28 DIAGNOSIS — I4891 Unspecified atrial fibrillation: Secondary | ICD-10-CM | POA: Diagnosis not present

## 2014-03-28 DIAGNOSIS — Z7901 Long term (current) use of anticoagulants: Secondary | ICD-10-CM | POA: Diagnosis not present

## 2014-04-10 ENCOUNTER — Encounter: Payer: Self-pay | Admitting: *Deleted

## 2014-04-22 DIAGNOSIS — I4891 Unspecified atrial fibrillation: Secondary | ICD-10-CM | POA: Diagnosis not present

## 2014-04-22 DIAGNOSIS — Z7901 Long term (current) use of anticoagulants: Secondary | ICD-10-CM | POA: Diagnosis not present

## 2014-05-07 ENCOUNTER — Telehealth: Payer: Self-pay | Admitting: *Deleted

## 2014-05-07 NOTE — Telephone Encounter (Signed)
Faxed CPAP supply order to [facility]. 

## 2014-05-27 ENCOUNTER — Ambulatory Visit (INDEPENDENT_AMBULATORY_CARE_PROVIDER_SITE_OTHER): Payer: Medicare Other | Admitting: *Deleted

## 2014-05-27 DIAGNOSIS — I480 Paroxysmal atrial fibrillation: Secondary | ICD-10-CM | POA: Diagnosis not present

## 2014-05-27 DIAGNOSIS — I481 Persistent atrial fibrillation: Secondary | ICD-10-CM | POA: Diagnosis not present

## 2014-05-27 DIAGNOSIS — I4819 Other persistent atrial fibrillation: Secondary | ICD-10-CM

## 2014-05-27 DIAGNOSIS — Z7901 Long term (current) use of anticoagulants: Secondary | ICD-10-CM | POA: Diagnosis not present

## 2014-05-27 NOTE — Progress Notes (Signed)
Remote pacemaker transmission.   

## 2014-06-03 DIAGNOSIS — J209 Acute bronchitis, unspecified: Secondary | ICD-10-CM | POA: Diagnosis not present

## 2014-06-07 LAB — MDC_IDC_ENUM_SESS_TYPE_REMOTE
Brady Statistic RV Percent Paced: 69 %
Implantable Pulse Generator Serial Number: 113290
Lead Channel Pacing Threshold Amplitude: 1 V
Lead Channel Pacing Threshold Pulse Width: 0.4 ms
Lead Channel Setting Sensing Sensitivity: 2.5 mV
MDC IDC MSMT LEADCHNL RV IMPEDANCE VALUE: 730 Ohm
MDC IDC MSMT LEADCHNL RV SENSING INTR AMPL: 15.6 mV
MDC IDC SET LEADCHNL RV PACING AMPLITUDE: 1.4 V
MDC IDC SET LEADCHNL RV PACING PULSEWIDTH: 0.4 ms
Zone Setting Detection Interval: 375 ms

## 2014-06-12 ENCOUNTER — Encounter: Payer: Self-pay | Admitting: Cardiology

## 2014-06-20 ENCOUNTER — Encounter: Payer: Self-pay | Admitting: Cardiovascular Disease

## 2014-07-12 DIAGNOSIS — Z7901 Long term (current) use of anticoagulants: Secondary | ICD-10-CM | POA: Diagnosis not present

## 2014-07-12 DIAGNOSIS — I480 Paroxysmal atrial fibrillation: Secondary | ICD-10-CM | POA: Diagnosis not present

## 2014-07-18 ENCOUNTER — Encounter (HOSPITAL_COMMUNITY): Payer: Self-pay | Admitting: Cardiovascular Disease

## 2014-08-17 DIAGNOSIS — Z23 Encounter for immunization: Secondary | ICD-10-CM | POA: Diagnosis not present

## 2014-08-19 ENCOUNTER — Emergency Department (HOSPITAL_BASED_OUTPATIENT_CLINIC_OR_DEPARTMENT_OTHER): Payer: Medicare Other

## 2014-08-19 ENCOUNTER — Emergency Department (HOSPITAL_BASED_OUTPATIENT_CLINIC_OR_DEPARTMENT_OTHER)
Admission: EM | Admit: 2014-08-19 | Discharge: 2014-08-20 | Disposition: A | Discharge status: Home / Self Care | Payer: Medicare Other | Attending: Emergency Medicine | Admitting: Emergency Medicine

## 2014-08-19 ENCOUNTER — Encounter (HOSPITAL_BASED_OUTPATIENT_CLINIC_OR_DEPARTMENT_OTHER): Payer: Self-pay | Admitting: *Deleted

## 2014-08-19 DIAGNOSIS — Z950 Presence of cardiac pacemaker: Secondary | ICD-10-CM | POA: Insufficient documentation

## 2014-08-19 DIAGNOSIS — G4733 Obstructive sleep apnea (adult) (pediatric): Secondary | ICD-10-CM | POA: Diagnosis not present

## 2014-08-19 DIAGNOSIS — S59912A Unspecified injury of left forearm, initial encounter: Secondary | ICD-10-CM | POA: Diagnosis not present

## 2014-08-19 DIAGNOSIS — S299XXA Unspecified injury of thorax, initial encounter: Secondary | ICD-10-CM | POA: Diagnosis present

## 2014-08-19 DIAGNOSIS — I252 Old myocardial infarction: Secondary | ICD-10-CM | POA: Diagnosis not present

## 2014-08-19 DIAGNOSIS — S0012XA Contusion of left eyelid and periocular area, initial encounter: Secondary | ICD-10-CM | POA: Diagnosis not present

## 2014-08-19 DIAGNOSIS — R51 Headache: Secondary | ICD-10-CM | POA: Diagnosis not present

## 2014-08-19 DIAGNOSIS — Z9981 Dependence on supplemental oxygen: Secondary | ICD-10-CM | POA: Insufficient documentation

## 2014-08-19 DIAGNOSIS — S2232XA Fracture of one rib, left side, initial encounter for closed fracture: Secondary | ICD-10-CM

## 2014-08-19 DIAGNOSIS — S60212A Contusion of left wrist, initial encounter: Secondary | ICD-10-CM | POA: Diagnosis not present

## 2014-08-19 DIAGNOSIS — Z7901 Long term (current) use of anticoagulants: Secondary | ICD-10-CM | POA: Insufficient documentation

## 2014-08-19 DIAGNOSIS — Y998 Other external cause status: Secondary | ICD-10-CM | POA: Insufficient documentation

## 2014-08-19 DIAGNOSIS — W19XXXA Unspecified fall, initial encounter: Secondary | ICD-10-CM

## 2014-08-19 DIAGNOSIS — S0990XA Unspecified injury of head, initial encounter: Secondary | ICD-10-CM | POA: Diagnosis not present

## 2014-08-19 DIAGNOSIS — M109 Gout, unspecified: Secondary | ICD-10-CM | POA: Diagnosis not present

## 2014-08-19 DIAGNOSIS — Z8719 Personal history of other diseases of the digestive system: Secondary | ICD-10-CM | POA: Insufficient documentation

## 2014-08-19 DIAGNOSIS — Z9889 Other specified postprocedural states: Secondary | ICD-10-CM | POA: Insufficient documentation

## 2014-08-19 DIAGNOSIS — I482 Chronic atrial fibrillation: Secondary | ICD-10-CM | POA: Insufficient documentation

## 2014-08-19 DIAGNOSIS — Y9289 Other specified places as the place of occurrence of the external cause: Secondary | ICD-10-CM | POA: Diagnosis not present

## 2014-08-19 DIAGNOSIS — M1812 Unilateral primary osteoarthritis of first carpometacarpal joint, left hand: Secondary | ICD-10-CM | POA: Diagnosis not present

## 2014-08-19 DIAGNOSIS — S2242XA Multiple fractures of ribs, left side, initial encounter for closed fracture: Secondary | ICD-10-CM | POA: Insufficient documentation

## 2014-08-19 DIAGNOSIS — S0512XA Contusion of eyeball and orbital tissues, left eye, initial encounter: Secondary | ICD-10-CM | POA: Diagnosis not present

## 2014-08-19 DIAGNOSIS — Z79899 Other long term (current) drug therapy: Secondary | ICD-10-CM | POA: Diagnosis not present

## 2014-08-19 DIAGNOSIS — W01198A Fall on same level from slipping, tripping and stumbling with subsequent striking against other object, initial encounter: Secondary | ICD-10-CM | POA: Insufficient documentation

## 2014-08-19 DIAGNOSIS — Y9389 Activity, other specified: Secondary | ICD-10-CM | POA: Insufficient documentation

## 2014-08-19 DIAGNOSIS — M542 Cervicalgia: Secondary | ICD-10-CM | POA: Diagnosis not present

## 2014-08-19 DIAGNOSIS — M79642 Pain in left hand: Secondary | ICD-10-CM | POA: Insufficient documentation

## 2014-08-19 DIAGNOSIS — I2510 Atherosclerotic heart disease of native coronary artery without angina pectoris: Secondary | ICD-10-CM | POA: Diagnosis not present

## 2014-08-19 DIAGNOSIS — S199XXA Unspecified injury of neck, initial encounter: Secondary | ICD-10-CM | POA: Diagnosis not present

## 2014-08-19 DIAGNOSIS — M79632 Pain in left forearm: Secondary | ICD-10-CM | POA: Diagnosis not present

## 2014-08-19 DIAGNOSIS — R52 Pain, unspecified: Secondary | ICD-10-CM

## 2014-08-19 DIAGNOSIS — R071 Chest pain on breathing: Secondary | ICD-10-CM | POA: Diagnosis not present

## 2014-08-19 LAB — CBC
HEMATOCRIT: 32.6 % — AB (ref 39.0–52.0)
Hemoglobin: 10.4 g/dL — ABNORMAL LOW (ref 13.0–17.0)
MCH: 31 pg (ref 26.0–34.0)
MCHC: 31.9 g/dL (ref 30.0–36.0)
MCV: 97 fL (ref 78.0–100.0)
PLATELETS: 158 10*3/uL (ref 150–400)
RBC: 3.36 MIL/uL — AB (ref 4.22–5.81)
RDW: 15.8 % — ABNORMAL HIGH (ref 11.5–15.5)
WBC: 7.2 10*3/uL (ref 4.0–10.5)

## 2014-08-19 NOTE — ED Notes (Signed)
Tripped over w/c ramp attached to vehicle, fell onto cement, stuck L arm out to break fall, c/o L elbow, FA and wrist pain. Also L ribs and L forehead. Forehead abrasion noted. Ice pack applied to L wrist in triage. (denies: sx other than pain; denies sob, LOC, nv or dizziness), takes coumadin. Alert, NAD, calm, interactive.

## 2014-08-20 ENCOUNTER — Encounter: Payer: Medicare Other | Admitting: Cardiovascular Disease

## 2014-08-20 ENCOUNTER — Emergency Department (HOSPITAL_BASED_OUTPATIENT_CLINIC_OR_DEPARTMENT_OTHER): Payer: Medicare Other

## 2014-08-20 DIAGNOSIS — I480 Paroxysmal atrial fibrillation: Secondary | ICD-10-CM | POA: Diagnosis not present

## 2014-08-20 DIAGNOSIS — Z7901 Long term (current) use of anticoagulants: Secondary | ICD-10-CM | POA: Diagnosis not present

## 2014-08-20 DIAGNOSIS — S2242XA Multiple fractures of ribs, left side, initial encounter for closed fracture: Secondary | ICD-10-CM | POA: Diagnosis not present

## 2014-08-20 LAB — PROTIME-INR
INR: 5.68 (ref 0.00–1.49)
Prothrombin Time: 51.3 seconds — ABNORMAL HIGH (ref 11.6–15.2)

## 2014-08-20 LAB — BASIC METABOLIC PANEL
ANION GAP: 6 (ref 5–15)
BUN: 39 mg/dL — ABNORMAL HIGH (ref 6–23)
CO2: 28 mmol/L (ref 19–32)
Calcium: 8.9 mg/dL (ref 8.4–10.5)
Chloride: 105 mEq/L (ref 96–112)
Creatinine, Ser: 1.3 mg/dL (ref 0.50–1.35)
GFR calc Af Amer: 58 mL/min — ABNORMAL LOW (ref >90)
GFR calc non Af Amer: 50 mL/min — ABNORMAL LOW (ref >90)
GLUCOSE: 100 mg/dL — AB (ref 70–99)
Potassium: 4.1 mmol/L (ref 3.5–5.1)
Sodium: 139 mmol/L (ref 135–145)

## 2014-08-20 MED ORDER — OXYCODONE-ACETAMINOPHEN 5-325 MG PO TABS
1.0000 | ORAL_TABLET | ORAL | Status: DC | PRN
Start: 2014-08-20 — End: 2015-09-09

## 2014-08-20 MED ORDER — OXYCODONE-ACETAMINOPHEN 5-325 MG PO TABS
ORAL_TABLET | ORAL | Status: AC
  Filled 2014-08-20: qty 1

## 2014-08-20 MED ORDER — OXYCODONE-ACETAMINOPHEN 5-325 MG PO TABS
1.0000 | ORAL_TABLET | Freq: Once | ORAL | Status: AC
  Administered 2014-08-20: 1 via ORAL

## 2014-08-20 MED ORDER — HYDROCODONE-ACETAMINOPHEN 5-325 MG PO TABS
1.0000 | ORAL_TABLET | Freq: Once | ORAL | Status: DC
  Filled 2014-08-20: qty 1

## 2014-08-20 NOTE — ED Provider Notes (Signed)
CSN: [id number]     Arrival date & time 08/19/14  2253 History  This chart was scribed for Wynetta Fines, MD by Hilda Lias, ED Scribe. This patient was seen in room MH01/MH01 and the patient's care was started at 12:19 AM.    Chief Complaint  Patient presents with  . Fall    The history is provided by the patient. No language interpreter was used.     HPI Comments: [name] is a 81 y.o. male who presents to the Emergency Department complaining of a fall that occurred 12 hours PTA. Pt states he was attempting to load his wife's wheelchair onto the ramp of a handicap van and tripped over the ramp. Pt states he landed on his left side, and tried to catch himself with his left arm, but was unable to break his fall. Pt now c/o left wrist and hand pain, left-sided rib pain and pain around his left eye. Pt denies LOC. Pt denies neck pain, back pain, SOB, nausea, vomiting, or dizziness.    Past Medical History  Diagnosis Date  . Coronary artery disease   . MI (myocardial infarction)   . Diverticulitis   . Gout   . Permanent atrial fibrillation   . Nonischemic cardiomyopathy     mild  . OSA on CPAP   . Hyperlipidemia   . Pulmonary hypertension    Past Surgical History  Procedure Laterality Date  . Replacement total knee    . Shoulder surgery    . Colostomy reversal    . Colostomy    . Coronary angioplasty  06/01/1999    successful to ostium of the first diagonal  . Cardiac catheterization  11/07/2008    nonischemic cardiomyopathy,pulmonary hypertension  . US echocardiography  02/01/2011    LA is mod-severely dilated,AOV & root sclerotic,ca+ AOV leaflets  . Nm myocar perf wall motion  11/24/2007    normal  . Permanent pacemaker insertion  10/04/2012    [employer]  . Esophagogastroduodenoscopy N/A 08/22/2013    Procedure: ESOPHAGOGASTRODUODENOSCOPY (EGD);  Surgeon: [name], MD;  Location: [facility] ENDOSCOPY;  Service: Endoscopy;  Laterality: N/A;  h/p in file cabinet,  jackie  . Savory dilation N/A 08/22/2013    Procedure: SAVORY DILATION;  Surgeon: [name], MD;  Location: [facility] ENDOSCOPY;  Service: Endoscopy;  Laterality: N/A;  . Permanent pacemaker insertion N/A 10/12/2011    Procedure: PERMANENT PACEMAKER INSERTION;  Surgeon: [name], MD;  Location: [place] CATH LAB;  Service: Cardiovascular;  Laterality: N/A;   Family History  Problem Relation Age of Onset  . Cancer Mother   . Heart attack Father   . Diabetes Brother    History  Substance Use Topics  . Smoking status: Never Smoker   . Smokeless tobacco: Never Used  . Alcohol Use: No    Review of Systems  Respiratory: Negative for shortness of breath.   Gastrointestinal: Negative for nausea and vomiting.  Musculoskeletal: Positive for myalgias and arthralgias. Negative for neck pain.  Neurological: Negative for dizziness.  All other systems reviewed and are negative.   Allergies  Hydrocodone and Vicodin  Home Medications   Prior to Admission medications   Medication Sig Start Date End Date Taking? Authorizing Provider  allopurinol (ZYLOPRIM) 300 MG tablet Take 300 mg by mouth daily. 03/01/13   Historical Provider, MD  Choline Fenofibrate (TRILIPIX) 135 MG capsule Take 135 mg by mouth daily.    Historical Provider, MD  COLCRYS 0.6 MG tablet Take 0.6  mg by mouth as needed. 03/01/13   Historical Provider, MD  Docusate Calcium (STOOL SOFTENER PO) Take 3 tablets by mouth daily.    Historical Provider, MD  dutasteride (AVODART) 0.5 MG capsule Take 0.5 mg by mouth daily.    Historical Provider, MD  escitalopram (LEXAPRO) 20 MG tablet Take 20 mg by mouth daily.    Historical Provider, MD  furosemide (LASIX) 20 MG tablet Take 20 mg by mouth daily. swelling    Historical Provider, MD  losartan (COZAAR) 100 MG tablet Take 100 mg by mouth daily.    Historical Provider, MD  Multiple Vitamin (MULTIVITAMIN WITH MINERALS) TABS tablet Take 1 tablet by mouth daily.    Historical Provider, MD   nitroGLYCERIN (NITROSTAT) 0.4 MG SL tablet Place 0.4 mg under the tongue every 5 (five) minutes as needed. Chest pain    Historical Provider, MD  potassium chloride SA (K-DUR,KLOR-CON) 20 MEQ tablet Take 20 mEq by mouth daily.     Historical Provider, MD  sertraline (ZOLOFT) 50 MG tablet Take 50 mg by mouth daily.    Historical Provider, MD  Tamsulosin HCl (FLOMAX) 0.4 MG CAPS Take 0.4 mg by mouth daily after breakfast.    Historical Provider, MD  traMADol (ULTRAM) 50 MG tablet Take 50 mg by mouth every 6 (six) hours as needed. For pain    Historical Provider, MD  warfarin (COUMADIN) 5 MG tablet Take 2.5-5 mg by mouth daily. Take one tablet every day except Monday, Wednesday, & Friday and take 2.5mg     Historical Provider, MD   BP 127/55 mmHg  Pulse 62  Temp(Src) 98.3 F (36.8 C) (Oral)  Resp 18  Ht 5\' 10"  (1.778 m)  Wt 248 lb (112.492 kg)  BMI 35.58 kg/m2  SpO2 96%   Physical Exam General: Well-developed, well-nourished male in no acute distress; appearance consistent with age of record HENT: normocephalic; left forehead abrasion and ecchymosis; left periorbital ecchymosis; no hemotympanum  Eyes: pupils equal, round and reactive to light; extraocular muscles intact Neck: supple; nontender Heart: regular rate and rhythm Lungs: clear to auscultation bilaterally Chest: Left anterolateral rib tenderness without deformity or crepitus Abdomen: soft; nondistended; nontender; no masses or hepatosplenomegaly; bowel sounds present Extremities: No deformity; limited ROM of left hand due to pain and swelling; pulses normal; swelling and ecchymosis of left distal forearm and hand; varicose veins of lower legs Neurologic: Awake, alert and oriented; motor function intact in all extremities and symmetric; no facial droop; fidgety movement of legs [patient states he has restless leg syndrome] Skin: Warm and dry Psychiatric: Normal mood and affect   ED Course  Procedures (including critical care  time)  DIAGNOSTIC STUDIES: Oxygen Saturation is 96% on RA, normal by my interpretation.    COORDINATION OF CARE: 12:30 AM Discussed treatment plan with pt at bedside and pt agreed to plan.    MDM   Nursing notes and vitals signs, including pulse oximetry, reviewed.  Summary of this visit's results, reviewed by myself:  Labs:  Results for orders placed or performed during the hospital encounter of 08/19/14 (from the past 24 hour(s))  CBC     Status: Abnormal   Collection Time: 08/19/14 11:40 PM  Result Value Ref Range   WBC 7.2 4.0 - 10.5 K/uL   RBC 3.36 (L) 4.22 - 5.81 MIL/uL   Hemoglobin 10.4 (L) 13.0 - 17.0 g/dL   HCT 32.6 (L) 39.0 - 52.0 %   MCV 97.0 78.0 - 100.0 fL   MCH 31.0 26.0 -  34.0 pg   MCHC 31.9 30.0 - 36.0 g/dL   RDW 15.8 (H) 11.5 - 15.5 %   Platelets 158 150 - 400 K/uL  Protime-INR     Status: Abnormal   Collection Time: 08/19/14 11:40 PM  Result Value Ref Range   Prothrombin Time 51.3 (H) 11.6 - 15.2 seconds   INR 5.68 (HH) 0.00 - 0.30  Basic metabolic panel     Status: Abnormal   Collection Time: 08/19/14 11:40 PM  Result Value Ref Range   Sodium 139 135 - 145 mmol/L   Potassium 4.1 3.5 - 5.1 mmol/L   Chloride 105 96 - 112 mEq/L   CO2 28 19 - 32 mmol/L   Glucose, Bld 100 (H) 70 - 99 mg/dL   BUN 39 (H) 6 - 23 mg/dL   Creatinine, Ser 1.30 0.50 - 1.35 mg/dL   Calcium 8.9 8.4 - 10.5 mg/dL   GFR calc non Af Amer 50 (L) >90 mL/min   GFR calc Af Amer 58 (L) >90 mL/min   Anion gap 6 5 - 15    Imaging Studies: Dg Ribs Unilateral W/chest Left  08/19/2014   CLINICAL DATA:  Status post fall, with injury to left ribs. Pain on inspiration and when sitting up. Initial encounter.  EXAM: LEFT RIBS AND CHEST - 3+ VIEW  COMPARISON:  Chest radiograph performed 10/13/2011  FINDINGS: There are likely minimally displaced fractures involving the left anterolateral fifth and sixth ribs.  Lung expansion is slightly decreased from the prior study. Vascular crowding and  vascular congestion or seen. No definite pleural effusion or pneumothorax is seen.  The cardiomediastinal silhouette is borderline normal in size. A pacemaker is noted overlying the left chest wall, with a single lead ending overlying the right ventricle. The patient is status post right-sided rotator cuff repair.  IMPRESSION: 1. Suspect minimally displaced fractures involving the left anterolateral fifth and sixth ribs. 2. Vascular congestion noted; lungs remain grossly clear.   Electronically Signed   By: Garald Balding M.D.   On: 08/19/2014 23:47   Dg Forearm Left  08/19/2014   CLINICAL DATA:  Status post fall, with injury to left forearm. Distal left forearm pain. Initial encounter.  EXAM: LEFT FOREARM - 2 VIEW  COMPARISON:  None.  FINDINGS: There is no evidence of fracture or dislocation. The radius and ulna appear intact. The elbow joint is grossly unremarkable in appearance, without evidence of elbow joint effusion. An enthesophyte is seen arising at the olecranon.  The carpal rows appear grossly intact, and demonstrate normal alignment. Degenerative change is noted at the first carpometacarpal joint, with subcortical cystic change. Scattered vascular calcifications are seen.  IMPRESSION: 1. No evidence of fracture or dislocation. 2. Osteoarthritis at the first carpometacarpal joint. 3. Scattered vascular calcifications noted.   Electronically Signed   By: Garald Balding M.D.   On: 08/19/2014 23:49   Ct Head Wo Contrast  08/20/2014   CLINICAL DATA:  Fall from wheelchair. Headache and neck pain. Initial encounter.  EXAM: CT HEAD WITHOUT CONTRAST  CT CERVICAL SPINE WITHOUT CONTRAST  TECHNIQUE: Multidetector CT imaging of the head and cervical spine was performed following the standard protocol without intravenous contrast. Multiplanar CT image reconstructions of the cervical spine were also generated.  COMPARISON:  Head and cervical spine CT 10/15/2012  FINDINGS: CT HEAD FINDINGS  No intracranial  hemorrhage, mass effect, or midline shift. No hydrocephalus. The basilar cisterns are patent. No evidence of territorial infarct. No intracranial fluid collection. Chronic small vessel  ischemia and remote left basal gangliar infarcts, unchanged from prior exam. Calvarium is intact. Included paranasal sinuses and mastoid air cells are well aerated.  CT CERVICAL SPINE FINDINGS  Cervical spine alignment is maintained. Vertebral body heights are maintained. The dens is intact. No jumped or perched facets. There is no evidence of fracture. Multilevel degenerative disc disease with diffuse disc space narrowing and endplate spurring. There is diffuse multilevel facet arthropathy. Overall, degenerative change appear similar to prior exam. No prevertebral soft tissue edema.  IMPRESSION: 1.  No acute intracranial abnormality. 2. Extensive multilevel degenerative change throughout the cervical spine, no acute fracture or subluxation.   Electronically Signed   By: Jeb Levering M.D.   On: 08/20/2014 00:04   Ct Cervical Spine Wo Contrast  08/20/2014   CLINICAL DATA:  Fall from wheelchair. Headache and neck pain. Initial encounter.  EXAM: CT HEAD WITHOUT CONTRAST  CT CERVICAL SPINE WITHOUT CONTRAST  TECHNIQUE: Multidetector CT imaging of the head and cervical spine was performed following the standard protocol without intravenous contrast. Multiplanar CT image reconstructions of the cervical spine were also generated.  COMPARISON:  Head and cervical spine CT 10/15/2012  FINDINGS: CT HEAD FINDINGS  No intracranial hemorrhage, mass effect, or midline shift. No hydrocephalus. The basilar cisterns are patent. No evidence of territorial infarct. No intracranial fluid collection. Chronic small vessel ischemia and remote left basal gangliar infarcts, unchanged from prior exam. Calvarium is intact. Included paranasal sinuses and mastoid air cells are well aerated.  CT CERVICAL SPINE FINDINGS  Cervical spine alignment is maintained.  Vertebral body heights are maintained. The dens is intact. No jumped or perched facets. There is no evidence of fracture. Multilevel degenerative disc disease with diffuse disc space narrowing and endplate spurring. There is diffuse multilevel facet arthropathy. Overall, degenerative change appear similar to prior exam. No prevertebral soft tissue edema.  IMPRESSION: 1.  No acute intracranial abnormality. 2. Extensive multilevel degenerative change throughout the cervical spine, no acute fracture or subluxation.   Electronically Signed   By: Jeb Levering M.D.   On: 08/20/2014 00:04   Dg Hand Complete Left  08/20/2014   CLINICAL DATA:  Left hand pain after fall yesterday. Bruising and swelling.  EXAM: LEFT HAND - COMPLETE 3+ VIEW  COMPARISON:  None.  FINDINGS: No fracture or dislocation. Joint space narrowing and proliferative change at the base of the thumb at the carpometacarpal joint. Multifocal osteophytes throughout the digits primarily involving the distal interphalangeal joints. Question small central erosion of the index finger DIP. Dorsal soft tissue edema is seen.  IMPRESSION: 1. No acute fracture or dislocation of the left hand. 2. Multifocal osteoarthritis, question erosive osteoarthritis of the index finger distal interphalangeal joint.   Electronically Signed   By: Jeb Levering M.D.   On: 08/20/2014 01:35   1:48 AM It is been a proximally 14 hours since patient's injury and he is showing no signs of neurologic abnormality. His head CT was done approximately 12 hours after the injury with no evidence of intracranial hemorrhage. He was advised to discontinue his Coumadin; he is on Coumadin for prevention of heart attack or stroke not for atrial fibrillation. He was advised to contact his primary care physician later today and inform him of his elevated INR and his injuries.   Wynetta Fines, MD 08/20/14 [phone]

## 2014-08-22 ENCOUNTER — Encounter: Payer: Self-pay | Admitting: Cardiovascular Disease

## 2014-09-04 ENCOUNTER — Telehealth: Payer: Self-pay | Admitting: Cardiovascular Disease

## 2014-09-04 DIAGNOSIS — R0609 Other forms of dyspnea: Secondary | ICD-10-CM | POA: Diagnosis not present

## 2014-09-04 DIAGNOSIS — Z7901 Long term (current) use of anticoagulants: Secondary | ICD-10-CM | POA: Diagnosis not present

## 2014-09-04 DIAGNOSIS — D649 Anemia, unspecified: Secondary | ICD-10-CM | POA: Diagnosis not present

## 2014-09-04 DIAGNOSIS — I480 Paroxysmal atrial fibrillation: Secondary | ICD-10-CM | POA: Diagnosis not present

## 2014-09-04 DIAGNOSIS — S2242XA Multiple fractures of ribs, left side, initial encounter for closed fracture: Secondary | ICD-10-CM | POA: Diagnosis not present

## 2014-09-05 ENCOUNTER — Ambulatory Visit (INDEPENDENT_AMBULATORY_CARE_PROVIDER_SITE_OTHER): Payer: Medicare Other | Admitting: *Deleted

## 2014-09-05 ENCOUNTER — Encounter: Payer: Self-pay | Admitting: Cardiovascular Disease

## 2014-09-05 ENCOUNTER — Ambulatory Visit (INDEPENDENT_AMBULATORY_CARE_PROVIDER_SITE_OTHER): Payer: Medicare Other | Admitting: Cardiovascular Disease

## 2014-09-05 VITALS — BP 126/62 | HR 72 | Ht 69.0 in | Wt 254.7 lb

## 2014-09-05 DIAGNOSIS — R413 Other amnesia: Secondary | ICD-10-CM

## 2014-09-05 DIAGNOSIS — I4819 Other persistent atrial fibrillation: Secondary | ICD-10-CM

## 2014-09-05 DIAGNOSIS — I481 Persistent atrial fibrillation: Secondary | ICD-10-CM

## 2014-09-05 DIAGNOSIS — Z950 Presence of cardiac pacemaker: Secondary | ICD-10-CM

## 2014-09-05 DIAGNOSIS — R0602 Shortness of breath: Secondary | ICD-10-CM

## 2014-09-05 DIAGNOSIS — I429 Cardiomyopathy, unspecified: Secondary | ICD-10-CM

## 2014-09-05 DIAGNOSIS — G4730 Sleep apnea, unspecified: Secondary | ICD-10-CM | POA: Diagnosis not present

## 2014-09-05 DIAGNOSIS — I428 Other cardiomyopathies: Secondary | ICD-10-CM

## 2014-09-05 LAB — MDC_IDC_ENUM_SESS_TYPE_INCLINIC
Battery Remaining Longevity: 8
Lead Channel Impedance Value: 651 Ohm
Lead Channel Pacing Threshold Pulse Width: 0.4 ms
Lead Channel Sensing Intrinsic Amplitude: 12.5 mV
Lead Channel Setting Pacing Amplitude: 1.5 V
Lead Channel Setting Sensing Sensitivity: 2.5 mV
MDC IDC MSMT LEADCHNL RV PACING THRESHOLD AMPLITUDE: 0.9 V
MDC IDC PG SERIAL: 113290
MDC IDC SET LEADCHNL RV PACING PULSEWIDTH: 0.4 ms
MDC IDC STAT BRADY RV PERCENT PACED: 67 %
Zone Setting Detection Interval: 375 ms

## 2014-09-05 NOTE — Patient Instructions (Signed)
Your physician recommends that you schedule a follow-up appointment in: Next available with Dr. Claiborne Billings for the [facility].  Your physician has requested that you have an echocardiogram. Echocardiography is a painless test that uses sound waves to create images of your heart. It provides your doctor with information about the size and shape of your heart and how well your heart's chambers and valves are working. This procedure takes approximately one hour. There are no restrictions for this procedure.  Remote monitoring is used to monitor your Pacemaker or ICD from home. This monitoring reduces the number of office visits required to check your device to one time per year. It allows [place] to monitor the functioning of your device to ensure it is working properly. You are scheduled for a device check from home on December 06, 2014. You may send your transmission at any time that day. If you have a wireless device, the transmission will be sent automatically. After your physician reviews your transmission, you will receive a postcard with your next transmission date.  Dr. [name] recommends that you schedule a follow-up appointment in: One year.

## 2014-09-06 NOTE — Progress Notes (Signed)
Reason for office visit Pacemaker check, shortness of breath  [name] has recently had worsening exertional dyspnea (NYHA class II-III) and occasional ankle swelling that is never severe. He does not have chest pain. His wife, who is here for the appointment today, reports that his restless legs are much worse than usual and that he is sleepy throughout the day, often falling asleep "even when standing up". He has not fallen. He has a long-standing history of obstructive sleep apnea and missed his last sleep clinic appointment. He has a single-chamber permanent pacemaker in place for atrial fibrillation with slow ventricular response [employer]) since 2013. He is not on anticoagulation therapy. Ventricular rate and blood pressure are well controlled. Most recent left ventricular ejection fraction by echocardiography was 50-55%, but that last study was performed in 2012. He was previously diagnosed with nonischemic cardiomyopathy (normal coronaries by angiography in 2010, EF 40-45%) mildly elevated pulmonary artery wedge pressure and pulmonary artery pressure at the time of his cardiac catheterization. He has become more forgetful.  Full pacemaker interrogation shows normal device function with good heart rate histogram distribution rare episodes of rapid ventricular rate and roughly 67% ventricular pacing. Battery is still at "beginning of life" status and estimated longevity of about 8 years     Allergies  Allergen Reactions  . Hydrocodone Nausea Only  . Vicodin [Hydrocodone-Acetaminophen] Nausea And Vomiting    Current Outpatient Prescriptions  Medication Sig Dispense Refill  . allopurinol (ZYLOPRIM) 300 MG tablet Take 300 mg by mouth daily.    . Choline Fenofibrate (TRILIPIX) 135 MG capsule Take 135 mg by mouth daily.    [name] COLCRYS 0.6 MG tablet Take 0.6 mg by mouth as needed.    [name] Calcium (STOOL SOFTENER PO) Take 3 tablets by mouth daily.    [name] dutasteride  (AVODART) 0.5 MG capsule Take 0.5 mg by mouth daily.    [name] escitalopram (LEXAPRO) 20 MG tablet Take 20 mg by mouth daily.    . furosemide (LASIX) 20 MG tablet Take 20 mg by mouth daily. swelling    . losartan (COZAAR) 100 MG tablet Take 100 mg by mouth daily.    . Multiple Vitamin (MULTIVITAMIN WITH MINERALS) TABS tablet Take 1 tablet by mouth daily.    . nitroGLYCERIN (NITROSTAT) 0.4 MG SL tablet Place 0.4 mg under the tongue every 5 (five) minutes as needed. Chest pain    . oxyCODONE-acetaminophen (PERCOCET) 5-325 MG per tablet Take 1 tablet by mouth every 4 (four) hours as needed (for pain). 20 tablet 0  . potassium chloride SA (K-DUR,KLOR-CON) 20 MEQ tablet Take 20 mEq by mouth daily.     [name] rOPINIRole (REQUIP) 2 MG tablet Take 2 mg by mouth daily.     . sertraline (ZOLOFT) 50 MG tablet Take 50 mg by mouth daily.    . Tamsulosin HCl (FLOMAX) 0.4 MG CAPS Take 0.4 mg by mouth daily after breakfast.    . traMADol (ULTRAM) 50 MG tablet Take 50 mg by mouth every 6 (six) hours as needed. For pain    . [DISCONTINUED] warfarin (COUMADIN) 5 MG tablet Take 2.5-5 mg by mouth daily. Take one tablet every day except Monday, Wednesday, & Friday and take 2.5mg      No current facility-administered medications for this visit.    Past Medical History  Diagnosis Date  . Coronary artery disease   . MI (myocardial infarction)   . Diverticulitis   . Gout   . Permanent atrial  fibrillation   . Nonischemic cardiomyopathy     mild  . OSA on CPAP   . Hyperlipidemia   . Pulmonary hypertension     Past Surgical History  Procedure Laterality Date  . Replacement total knee    . Shoulder surgery    . Colostomy reversal    . Colostomy    . Coronary angioplasty  06/01/1999    successful to ostium of the first diagonal  . Cardiac catheterization  11/07/2008    nonischemic cardiomyopathy,pulmonary hypertension  . US echocardiography  02/01/2011    LA is mod-severely dilated,AOV & root sclerotic,ca+ AOV leaflets   . Nm myocar perf wall motion  11/24/2007    normal  . Permanent pacemaker insertion  10/04/2012    [employer]  . Esophagogastroduodenoscopy N/A 08/22/2013    Procedure: ESOPHAGOGASTRODUODENOSCOPY (EGD);  Surgeon: [name], MD;  Location: [facility] ENDOSCOPY;  Service: Endoscopy;  Laterality: N/A;  h/p in file cabinet, jackie  . Savory dilation N/A 08/22/2013    Procedure: SAVORY DILATION;  Surgeon: [name], MD;  Location: [facility] ENDOSCOPY;  Service: Endoscopy;  Laterality: N/A;  . Permanent pacemaker insertion N/A 10/12/2011    Procedure: PERMANENT PACEMAKER INSERTION;  Surgeon: [name], MD;  Location: [place] CATH LAB;  Service: Cardiovascular;  Laterality: N/A;    Family History  Problem Relation Age of Onset  . Cancer Mother   . Heart attack Father   . Diabetes Brother     History   Social History  . Marital Status: Married    Spouse Name: N/A    Number of Children: N/A  . Years of Education: N/A   Occupational History  . Not on file.   Social History Main Topics  . Smoking status: Never Smoker   . Smokeless tobacco: Never Used  . Alcohol Use: No  . Drug Use: No  . Sexual Activity: Not on file   Other Topics Concern  . Not on file   Social History Narrative    Review of systems: Described in detail above.  PHYSICAL EXAM BP 126/62 mmHg  Pulse 72  Ht 5\' 9"  (1.753 m)  Wt 254 lb 11.2 oz (115.531 kg)  BMI 37.60 kg/m2  General: Alert, oriented x3, no distress Head: no evidence of trauma, PERRL, EOMI, no exophtalmos or lid lag, no myxedema, no xanthelasma; normal ears, nose and oropharynx Neck: normal jugular venous pulsations and no hepatojugular reflux; brisk carotid pulses without delay and no carotid bruits Chest: clear to auscultation, no signs of consolidation by percussion or palpation, normal fremitus, symmetrical and full respiratory excursions Cardiovascular: normal position and quality of the apical impulse, irregular rhythm, normal first and second  heart sounds, no murmurs, rubs or gallops Abdomen: no tenderness or distention, no masses by palpation, no abnormal pulsatility or arterial bruits, normal bowel sounds, no hepatosplenomegaly Extremities: no clubbing, cyanosis or edema; 2+ radial, ulnar and brachial pulses bilaterally; 2+ right femoral, posterior tibial and dorsalis pedis pulses; 2+ left femoral, posterior tibial and dorsalis pedis pulses; no subclavian or femoral bruits Neurological: grossly nonfocal   EKG: Atrial fibrillation with intermittent ventricular pacing  Lipid Panel     Component Value Date/Time   CHOL  11/04/2008 1857    173        ATP III CLASSIFICATION:  <200     mg/dL   Desirable  200-239  mg/dL   Borderline High  >=240    mg/dL   High  TRIG 126 11/04/2008 1857   HDL 37* 11/04/2008 1857   CHOLHDL 4.7 11/04/2008 1857   VLDL 25 11/04/2008 1857   LDLCALC * 11/04/2008 1857    111        Total Cholesterol/HDL:CHD Risk Coronary Heart Disease Risk Table                     Men   Women  1/2 Average Risk   3.4   3.3  Average Risk       5.0   4.4  2 X Average Risk   9.6   7.1  3 X Average Risk  23.4   11.0        Use the calculated Patient Ratio above and the CHD Risk Table to determine the patient's CHD Risk.        ATP III CLASSIFICATION (LDL):  <100     mg/dL   Optimal  100-129  mg/dL   Near or Above                    Optimal  130-159  mg/dL   Borderline  160-189  mg/dL   High  >190     mg/dL   Very High    BMET    Component Value Date/Time   NA 139 08/19/2014 2340   K 4.1 08/19/2014 2340   CL 105 08/19/2014 2340   CO2 28 08/19/2014 2340   GLUCOSE 100* 08/19/2014 2340   BUN 39* 08/19/2014 2340   CREATININE 1.30 08/19/2014 2340   CALCIUM 8.9 08/19/2014 2340   GFRNONAA 50* 08/19/2014 2340   GFRAA 58* 08/19/2014 2340     ASSESSMENT AND PLAN  Permanent atrial fibrillation with normal function of single chamber pacemaker and roughly 70% ventricular pacing. Worsening dyspnea  might be a sign of congestive heart failure, conceivably a consequence of right ventricular pacing, but by physical exam there is no evidence of hypervolemia. Many of his symptoms are consistent with insufficiently treated obstructive sleep apnea, despite reported compliance with his CPAP machine. This could explain severe daytime hypersomnolence and fatigue as well as dyspnea. We'll reschedule his sleep clinic appointment. Recommended echocardiogram to reevaluate pulmonary artery pressure and left ventricular systolic function/LV synchrony. By clinical criteria I do not see a reason to prescribe diuretics at this time.  Orders Placed This Encounter  Procedures  . 2D Echocardiogram without contrast   Meds ordered this encounter  Medications  . rOPINIRole (REQUIP) 2 MG tablet    Sig: Take 2 mg by mouth daily.     Holli Humbles, MD, [place] [phone] office [phone] pager

## 2014-09-11 DIAGNOSIS — M109 Gout, unspecified: Secondary | ICD-10-CM | POA: Diagnosis not present

## 2014-09-11 DIAGNOSIS — I2510 Atherosclerotic heart disease of native coronary artery without angina pectoris: Secondary | ICD-10-CM | POA: Diagnosis not present

## 2014-09-11 DIAGNOSIS — I10 Essential (primary) hypertension: Secondary | ICD-10-CM | POA: Diagnosis not present

## 2014-09-11 DIAGNOSIS — E785 Hyperlipidemia, unspecified: Secondary | ICD-10-CM | POA: Diagnosis not present

## 2014-09-11 DIAGNOSIS — Z125 Encounter for screening for malignant neoplasm of prostate: Secondary | ICD-10-CM | POA: Diagnosis not present

## 2014-09-11 NOTE — Progress Notes (Signed)
Pacemaker check in clinic (Industry checked). Normal device function. Threshold, sensing, and impedance consistent with previous measurements. Device programmed to maximize longevity. Permanent AF + no A/C.  7 "NSVT" episodes noted. Device programmed at appropriate safety margins. Histogram distribution appropriate for patient activity level. Device programmed to optimize intrinsic conduction. Estimated longevity 8 years. Patient will follow up via [employer] on 5-2 and with [facility] in 12 months.

## 2014-09-12 ENCOUNTER — Ambulatory Visit (HOSPITAL_COMMUNITY)
Admission: RE | Admit: 2014-09-12 | Discharge: 2014-09-12 | Disposition: A | Discharge status: Home / Self Care | Payer: Medicare Other | Source: Ambulatory Visit | Attending: Internal Medicine | Admitting: Internal Medicine

## 2014-09-12 DIAGNOSIS — R0602 Shortness of breath: Secondary | ICD-10-CM

## 2014-09-12 DIAGNOSIS — R0600 Dyspnea, unspecified: Secondary | ICD-10-CM | POA: Insufficient documentation

## 2014-09-12 DIAGNOSIS — I4891 Unspecified atrial fibrillation: Secondary | ICD-10-CM | POA: Insufficient documentation

## 2014-09-12 NOTE — Progress Notes (Signed)
2D Echo Performed 09/12/2014    Marygrace Drought, RCS

## 2014-09-13 DIAGNOSIS — I480 Paroxysmal atrial fibrillation: Secondary | ICD-10-CM | POA: Diagnosis not present

## 2014-09-13 DIAGNOSIS — Z7901 Long term (current) use of anticoagulants: Secondary | ICD-10-CM | POA: Diagnosis not present

## 2014-09-16 NOTE — Progress Notes (Signed)
This encounter was created in error - please disregard.

## 2014-09-18 ENCOUNTER — Telehealth: Payer: Self-pay | Admitting: Cardiovascular Disease

## 2014-09-18 ENCOUNTER — Encounter: Payer: Self-pay | Admitting: Cardiovascular Disease

## 2014-09-18 NOTE — Telephone Encounter (Signed)
Closed encounter °

## 2014-09-18 NOTE — Telephone Encounter (Signed)
Pt had been advised at last visit w/ Dr. Loletha Grayer to get in w/ Dr. Claiborne Billings for sleep clinic.  He has been having increased sleep disruption and issues may be related to CPAP.  Wife explains that they sent chip to [facility]. They are waiting to hear back. She is wondering how long this typically takes and if anything futher is advised.  Also wants to know how soon they should see Dr. Claiborne Billings.

## 2014-09-18 NOTE — Telephone Encounter (Signed)
Pt's wife was wanting to know when did Dr. Claiborne Billings want him to come back in and f/u with him. Please call  Thanks

## 2014-09-19 ENCOUNTER — Encounter: Payer: Self-pay | Admitting: Cardiovascular Disease

## 2014-09-19 NOTE — Telephone Encounter (Signed)
Called and spoke with patient's wife informing her that I have received the CPAP download report. Once Dr. Claiborne Billings reviews it I will call them with any recommended changes and /or appointment if needed.

## 2014-09-20 NOTE — Progress Notes (Signed)
Spoke with wife informing her that Dr. Claiborne Billings received download results this week once reviewed we will call with results.

## 2014-09-27 DIAGNOSIS — I10 Essential (primary) hypertension: Secondary | ICD-10-CM | POA: Diagnosis not present

## 2014-09-27 DIAGNOSIS — I2510 Atherosclerotic heart disease of native coronary artery without angina pectoris: Secondary | ICD-10-CM | POA: Diagnosis not present

## 2014-09-27 DIAGNOSIS — I480 Paroxysmal atrial fibrillation: Secondary | ICD-10-CM | POA: Diagnosis not present

## 2014-09-27 DIAGNOSIS — Z6837 Body mass index (BMI) 37.0-37.9, adult: Secondary | ICD-10-CM | POA: Diagnosis not present

## 2014-09-27 DIAGNOSIS — G4733 Obstructive sleep apnea (adult) (pediatric): Secondary | ICD-10-CM | POA: Diagnosis not present

## 2014-09-27 DIAGNOSIS — I8390 Asymptomatic varicose veins of unspecified lower extremity: Secondary | ICD-10-CM | POA: Diagnosis not present

## 2014-09-27 DIAGNOSIS — R0602 Shortness of breath: Secondary | ICD-10-CM | POA: Diagnosis not present

## 2014-09-27 DIAGNOSIS — R609 Edema, unspecified: Secondary | ICD-10-CM | POA: Diagnosis not present

## 2014-09-27 DIAGNOSIS — D649 Anemia, unspecified: Secondary | ICD-10-CM | POA: Diagnosis not present

## 2014-09-27 DIAGNOSIS — Z7901 Long term (current) use of anticoagulants: Secondary | ICD-10-CM | POA: Diagnosis not present

## 2014-10-02 DIAGNOSIS — R0602 Shortness of breath: Secondary | ICD-10-CM | POA: Diagnosis not present

## 2014-10-02 DIAGNOSIS — W0110XA Fall on same level from slipping, tripping and stumbling with subsequent striking against unspecified object, initial encounter: Secondary | ICD-10-CM | POA: Diagnosis not present

## 2014-10-02 DIAGNOSIS — R809 Proteinuria, unspecified: Secondary | ICD-10-CM | POA: Diagnosis not present

## 2014-10-02 DIAGNOSIS — I8390 Asymptomatic varicose veins of unspecified lower extremity: Secondary | ICD-10-CM | POA: Diagnosis not present

## 2014-10-02 DIAGNOSIS — Z0000 Encounter for general adult medical examination without abnormal findings: Secondary | ICD-10-CM | POA: Diagnosis not present

## 2014-10-02 DIAGNOSIS — Z7901 Long term (current) use of anticoagulants: Secondary | ICD-10-CM | POA: Diagnosis not present

## 2014-10-02 DIAGNOSIS — I10 Essential (primary) hypertension: Secondary | ICD-10-CM | POA: Diagnosis not present

## 2014-10-02 DIAGNOSIS — Z6836 Body mass index (BMI) 36.0-36.9, adult: Secondary | ICD-10-CM | POA: Diagnosis not present

## 2014-10-02 DIAGNOSIS — I2510 Atherosclerotic heart disease of native coronary artery without angina pectoris: Secondary | ICD-10-CM | POA: Diagnosis not present

## 2014-10-02 DIAGNOSIS — D649 Anemia, unspecified: Secondary | ICD-10-CM | POA: Diagnosis not present

## 2014-10-02 DIAGNOSIS — I480 Paroxysmal atrial fibrillation: Secondary | ICD-10-CM | POA: Diagnosis not present

## 2014-10-02 DIAGNOSIS — E785 Hyperlipidemia, unspecified: Secondary | ICD-10-CM | POA: Diagnosis not present

## 2014-10-03 DIAGNOSIS — Z1212 Encounter for screening for malignant neoplasm of rectum: Secondary | ICD-10-CM | POA: Diagnosis not present

## 2014-10-10 DIAGNOSIS — D225 Melanocytic nevi of trunk: Secondary | ICD-10-CM | POA: Diagnosis not present

## 2014-10-10 DIAGNOSIS — L281 Prurigo nodularis: Secondary | ICD-10-CM | POA: Diagnosis not present

## 2014-10-10 DIAGNOSIS — C44319 Basal cell carcinoma of skin of other parts of face: Secondary | ICD-10-CM | POA: Diagnosis not present

## 2014-10-10 DIAGNOSIS — D692 Other nonthrombocytopenic purpura: Secondary | ICD-10-CM | POA: Diagnosis not present

## 2014-10-10 DIAGNOSIS — L570 Actinic keratosis: Secondary | ICD-10-CM | POA: Diagnosis not present

## 2014-10-10 DIAGNOSIS — D1801 Hemangioma of skin and subcutaneous tissue: Secondary | ICD-10-CM | POA: Diagnosis not present

## 2014-10-10 DIAGNOSIS — D485 Neoplasm of uncertain behavior of skin: Secondary | ICD-10-CM | POA: Diagnosis not present

## 2014-10-11 DIAGNOSIS — R0609 Other forms of dyspnea: Secondary | ICD-10-CM | POA: Diagnosis not present

## 2014-10-11 DIAGNOSIS — Z6836 Body mass index (BMI) 36.0-36.9, adult: Secondary | ICD-10-CM | POA: Diagnosis not present

## 2014-10-11 DIAGNOSIS — W0110XD Fall on same level from slipping, tripping and stumbling with subsequent striking against unspecified object, subsequent encounter: Secondary | ICD-10-CM | POA: Diagnosis not present

## 2014-10-11 DIAGNOSIS — Z7901 Long term (current) use of anticoagulants: Secondary | ICD-10-CM | POA: Diagnosis not present

## 2014-10-11 DIAGNOSIS — I480 Paroxysmal atrial fibrillation: Secondary | ICD-10-CM | POA: Diagnosis not present

## 2014-10-11 DIAGNOSIS — D649 Anemia, unspecified: Secondary | ICD-10-CM | POA: Diagnosis not present

## 2014-10-16 DIAGNOSIS — K31819 Angiodysplasia of stomach and duodenum without bleeding: Secondary | ICD-10-CM | POA: Diagnosis not present

## 2014-10-16 DIAGNOSIS — D500 Iron deficiency anemia secondary to blood loss (chronic): Secondary | ICD-10-CM | POA: Diagnosis not present

## 2014-10-30 ENCOUNTER — Other Ambulatory Visit: Payer: Self-pay | Admitting: Gastroenterology

## 2014-10-30 NOTE — Addendum Note (Signed)
Addended byClarene Essex on: 10/30/2014 11:59 AM   Modules accepted: Orders

## 2014-11-05 ENCOUNTER — Encounter (HOSPITAL_COMMUNITY)
Admission: RE | Disposition: A | Discharge status: Home / Self Care | Payer: Self-pay | Source: Ambulatory Visit | Attending: Gastroenterology

## 2014-11-05 ENCOUNTER — Ambulatory Visit (HOSPITAL_COMMUNITY)
Admission: RE | Admit: 2014-11-05 | Discharge: 2014-11-05 | Disposition: A | Discharge status: Home / Self Care | Payer: Medicare Other | Source: Ambulatory Visit | Attending: Gastroenterology | Admitting: Gastroenterology

## 2014-11-05 ENCOUNTER — Encounter (HOSPITAL_COMMUNITY): Payer: Self-pay | Admitting: *Deleted

## 2014-11-05 DIAGNOSIS — K921 Melena: Secondary | ICD-10-CM | POA: Diagnosis present

## 2014-11-05 DIAGNOSIS — D500 Iron deficiency anemia secondary to blood loss (chronic): Secondary | ICD-10-CM | POA: Diagnosis not present

## 2014-11-05 DIAGNOSIS — K449 Diaphragmatic hernia without obstruction or gangrene: Secondary | ICD-10-CM | POA: Insufficient documentation

## 2014-11-05 DIAGNOSIS — K5521 Angiodysplasia of colon with hemorrhage: Secondary | ICD-10-CM | POA: Diagnosis not present

## 2014-11-05 HISTORY — PX: HOT HEMOSTASIS: SHX5433

## 2014-11-05 HISTORY — PX: ESOPHAGOGASTRODUODENOSCOPY: SHX5428

## 2014-11-05 LAB — CBC
HCT: 35.3 % — ABNORMAL LOW (ref 39.0–52.0)
HEMOGLOBIN: 11.2 g/dL — AB (ref 13.0–17.0)
MCH: 31 pg (ref 26.0–34.0)
MCHC: 31.7 g/dL (ref 30.0–36.0)
MCV: 97.8 fL (ref 78.0–100.0)
PLATELETS: 158 10*3/uL (ref 150–400)
RBC: 3.61 MIL/uL — AB (ref 4.22–5.81)
RDW: 18.8 % — ABNORMAL HIGH (ref 11.5–15.5)
WBC: 4.8 10*3/uL (ref 4.0–10.5)

## 2014-11-05 SURGERY — EGD (ESOPHAGOGASTRODUODENOSCOPY)
Anesthesia: Moderate Sedation

## 2014-11-05 MED ORDER — FENTANYL CITRATE 0.05 MG/ML IJ SOLN
INTRAMUSCULAR | Status: AC
  Filled 2014-11-05: qty 2

## 2014-11-05 MED ORDER — FENTANYL CITRATE 0.05 MG/ML IJ SOLN
INTRAMUSCULAR | Status: DC | PRN
  Administered 2014-11-05 (×3): 25 ug via INTRAVENOUS

## 2014-11-05 MED ORDER — BUTAMBEN-TETRACAINE-BENZOCAINE 2-2-14 % EX AERO
INHALATION_SPRAY | CUTANEOUS | Status: DC | PRN
  Administered 2014-11-05: 2 via TOPICAL

## 2014-11-05 MED ORDER — MIDAZOLAM HCL 10 MG/2ML IJ SOLN
INTRAMUSCULAR | Status: DC | PRN
  Administered 2014-11-05 (×3): 2 mg via INTRAVENOUS

## 2014-11-05 MED ORDER — MIDAZOLAM HCL 5 MG/ML IJ SOLN
INTRAMUSCULAR | Status: AC
  Filled 2014-11-05: qty 2

## 2014-11-05 MED ORDER — SODIUM CHLORIDE 0.9 % IV SOLN
INTRAVENOUS | Status: DC
  Administered 2014-11-05: 500 mL via INTRAVENOUS

## 2014-11-05 NOTE — Discharge Instructions (Signed)
Call if question or problem or if signs of bleeding otherwise follow-up in one month and okay to resume Coumadin at previous dose in one to 2 days if no delayed complication or problem and no aspirin or arthritis pills while on Coumadin

## 2014-11-05 NOTE — Progress Notes (Signed)
[name] 12:58 PM  Subjective: Patient doing about the same from when we saw him recently in the office without any specific GI complaints or signs of bleeding  Objective: Vital signs stable afebrile exam please see preassessment evaluation  Assessment: Anemia in a patient on Coumadin currently being held  Plan: Okay to proceed with repeat endoscopy and possibly APC of AVMs seen in the past  [facility]

## 2014-11-06 ENCOUNTER — Encounter (HOSPITAL_COMMUNITY): Payer: Self-pay | Admitting: Gastroenterology

## 2014-11-20 DIAGNOSIS — I480 Paroxysmal atrial fibrillation: Secondary | ICD-10-CM | POA: Diagnosis not present

## 2014-11-20 DIAGNOSIS — R609 Edema, unspecified: Secondary | ICD-10-CM | POA: Diagnosis not present

## 2014-11-20 DIAGNOSIS — Z7901 Long term (current) use of anticoagulants: Secondary | ICD-10-CM | POA: Diagnosis not present

## 2014-11-20 DIAGNOSIS — I2510 Atherosclerotic heart disease of native coronary artery without angina pectoris: Secondary | ICD-10-CM | POA: Diagnosis not present

## 2014-11-20 DIAGNOSIS — E785 Hyperlipidemia, unspecified: Secondary | ICD-10-CM | POA: Diagnosis not present

## 2014-11-20 DIAGNOSIS — Z6834 Body mass index (BMI) 34.0-34.9, adult: Secondary | ICD-10-CM | POA: Diagnosis not present

## 2014-11-20 DIAGNOSIS — I10 Essential (primary) hypertension: Secondary | ICD-10-CM | POA: Diagnosis not present

## 2014-11-20 DIAGNOSIS — D649 Anemia, unspecified: Secondary | ICD-10-CM | POA: Diagnosis not present

## 2014-11-26 NOTE — Op Note (Signed)
[facility] [place], [address]   ENDOSCOPY PROCEDURE REPORT  PATIENT: Anthony, [name]  MR#: [id number] BIRTHDATE: [birth year] , 48  yrs. old GENDER: male ENDOSCOPIST: Clarene Essex, MD REFERRED BY:  Domenick Gong, M.D. PROCEDURE DATE:  2014/11/19 PROCEDURE:  EGD w/ ablation ASA CLASS:     Class III INDICATIONS:  melena and acute post hemorrhagic anemia. MEDICATIONS: Fentanyl 75 mcg IV and Versed 6 mg IV TOPICAL ANESTHETIC: Cetacaine Spray  DESCRIPTION OF PROCEDURE: After the risks benefits and alternatives of the procedure were thoroughly explained, informed consent was obtained.  The Pentax Gastroscope [id number] endoscope was introduced through the mouth and advanced to the second portion of the duodenum , Without limitations.  The instrument was slowly withdrawn as the mucosa was fully examined.    The findings are recorded below       Retroflexed views revealed no abnormalities.     The scope was then withdrawn from the patient and the procedure completed.  COMPLICATIONS: There were no immediate complications.  ENDOSCOPIC IMPRESSION: 1. Tiny hiatal hernia 2. Small greater curve AVM with oozing status post APC with nice coagulation and cessation of bleeding 3. Otherwise within normal limits EGD  RECOMMENDATIONS: await CBC resume Coumadin tomorrow and follow-up in one month to see if any further workup or plans are needed and call sooner when necessary  REPEAT EXAM: as needed  eSigned:  Clarene Essex, MD November 19, 2014 1:41 PM    CC:  CPT CODES: ICD CODES:  The ICD and CPT codes recommended by this software are interpretations from the data that the clinical staff has captured with the software.  The verification of the translation of this report to the ICD and CPT codes and modifiers is the sole responsibility of the health care institution and practicing physician where this report was generated.  [employer]. will not be held responsible for the validity of the ICD and CPT codes included on this report.  AMA assumes no liability for data contained or not contained herein. CPT is a Designer, television/film set of the [employer].  PATIENT NAME:  Anthony, [name] MR#: [id number]

## 2014-12-09 ENCOUNTER — Ambulatory Visit: Payer: Medicare Other | Admitting: Podiatry

## 2014-12-09 ENCOUNTER — Ambulatory Visit (INDEPENDENT_AMBULATORY_CARE_PROVIDER_SITE_OTHER): Payer: Medicare Other | Admitting: *Deleted

## 2014-12-09 DIAGNOSIS — I481 Persistent atrial fibrillation: Secondary | ICD-10-CM | POA: Diagnosis not present

## 2014-12-09 DIAGNOSIS — I4819 Other persistent atrial fibrillation: Secondary | ICD-10-CM

## 2014-12-09 LAB — CUP PACEART REMOTE DEVICE CHECK
Battery Remaining Longevity: 90 mo
Brady Statistic RV Percent Paced: 66 %
Date Time Interrogation Session: 20160502142600
Lead Channel Pacing Threshold Pulse Width: 0.4 ms
Lead Channel Setting Pacing Pulse Width: 0.4 ms
Lead Channel Setting Sensing Sensitivity: 2.5 mV
MDC IDC MSMT BATTERY REMAINING PERCENTAGE: 100 %
MDC IDC MSMT LEADCHNL RV IMPEDANCE VALUE: 738 Ohm
MDC IDC MSMT LEADCHNL RV PACING THRESHOLD AMPLITUDE: 0.7 V
MDC IDC SET LEADCHNL RV PACING AMPLITUDE: 1.5 V
MDC IDC SET ZONE DETECTION INTERVAL: 375 ms
Pulse Gen Serial Number: 113290

## 2014-12-09 NOTE — Progress Notes (Signed)
Remote pacemaker transmission.   

## 2014-12-10 ENCOUNTER — Telehealth: Payer: Self-pay | Admitting: *Deleted

## 2014-12-10 NOTE — Telephone Encounter (Signed)
Pt called and left birthdate and phone number, no message.  I called the phone number and the pt states he doesn't remember calling and gave the phone to his wife.  Pt's wife states pt has an appt soon.  I gave the appt date scheduled as 12/13/2014 at 245pm with Dr. Jacqualyn Posey.

## 2014-12-12 DIAGNOSIS — R5383 Other fatigue: Secondary | ICD-10-CM | POA: Diagnosis not present

## 2014-12-12 DIAGNOSIS — D500 Iron deficiency anemia secondary to blood loss (chronic): Secondary | ICD-10-CM | POA: Diagnosis not present

## 2014-12-12 DIAGNOSIS — R634 Abnormal weight loss: Secondary | ICD-10-CM | POA: Diagnosis not present

## 2014-12-12 DIAGNOSIS — K31819 Angiodysplasia of stomach and duodenum without bleeding: Secondary | ICD-10-CM | POA: Diagnosis not present

## 2014-12-13 ENCOUNTER — Ambulatory Visit (INDEPENDENT_AMBULATORY_CARE_PROVIDER_SITE_OTHER): Payer: Medicare Other | Admitting: Podiatry

## 2014-12-13 ENCOUNTER — Encounter: Payer: Self-pay | Admitting: Podiatry

## 2014-12-13 VITALS — BP 109/60 | HR 60 | Resp 12

## 2014-12-13 DIAGNOSIS — L84 Corns and callosities: Secondary | ICD-10-CM | POA: Diagnosis not present

## 2014-12-13 NOTE — Progress Notes (Signed)
   Subjective:    Patient ID: [name], male    DOB: 04-24-1932, 81 y.o.   MRN: [id number]  HPI 81 year old male presents the office today with complaints of a history of ulceration the right foot underlying the ball of the first toe joint. He states that when he may need appointment the wound was open however since then the wound is then closed and he is formed callus over the area. He denies ever having any drainage coming from the wound and he denies any surrounding redness or any edema. He states the pressure seemed to make the area worse. He was using Neosporin and a bandage overlying the area. No other complaints at this time.   Review of Systems  Musculoskeletal: Positive for gait problem.  Skin: Positive for wound.       Objective:   Physical Exam AAO 3, NAD DP/PT pulses palpable, CRT less than 3 seconds Protective sensation somewhat decreased with Simms Weinstein monofilament, vibratory sensation intact, Achilles tendon reflex intact. Right foot submetatarsal one hyperkeratotic lesion. Upon debridement lesion there is no underlying ulceration of the skin is intact. There is no drainage or surrounding erythema or any other clinical signs of infection. On the right submetatarsal one there is a small hyperkeratotic lesion. Upon debridement no underlying ulceration, drainage, clinical signs of infection. There is prominence of the metatarsal heads plantarly with atrophy of the fat pad. Hammertoe contractures are present. No other open lesions or pre-ulcerative lesions were identified bilaterally. No overlying edema, erythema, increase in warmth to bilateral lower extremities. No areas of tenderness to bilateral lower extremities. MMT 5/5, ROM WNL No pain with calf compression, swelling, warmth, erythema.      Assessment & Plan:  81 year old male bilateral submetatarsal 1 pre-ulcerative lesions. -Treatment options were discussed the patient including all alternatives, risks,  complications. -Lesions were sharply debrided 2 without complication/bleeding -Discussed shoe gear modifications and orthotics the patient. He wishes to hold off at this time. Dispensed offloading pads. -Monitor closely for any further skin breakdown of his any changes call the office immediately. -Otherwise, follow-up in 3 months or sooner if any problems are to arise. Call the office with any questions, concerns, change in symptoms.

## 2014-12-15 ENCOUNTER — Encounter: Payer: Self-pay | Admitting: Podiatry

## 2014-12-17 ENCOUNTER — Encounter: Payer: Self-pay | Admitting: Cardiology

## 2014-12-18 ENCOUNTER — Encounter: Payer: Self-pay | Admitting: Cardiovascular Disease

## 2015-01-27 DIAGNOSIS — I480 Paroxysmal atrial fibrillation: Secondary | ICD-10-CM | POA: Diagnosis not present

## 2015-01-27 DIAGNOSIS — H2513 Age-related nuclear cataract, bilateral: Secondary | ICD-10-CM | POA: Diagnosis not present

## 2015-01-27 DIAGNOSIS — Z7901 Long term (current) use of anticoagulants: Secondary | ICD-10-CM | POA: Diagnosis not present

## 2015-01-27 DIAGNOSIS — H52203 Unspecified astigmatism, bilateral: Secondary | ICD-10-CM | POA: Diagnosis not present

## 2015-02-11 DIAGNOSIS — Z7901 Long term (current) use of anticoagulants: Secondary | ICD-10-CM | POA: Diagnosis not present

## 2015-02-11 DIAGNOSIS — I480 Paroxysmal atrial fibrillation: Secondary | ICD-10-CM | POA: Diagnosis not present

## 2015-02-11 DIAGNOSIS — D500 Iron deficiency anemia secondary to blood loss (chronic): Secondary | ICD-10-CM | POA: Diagnosis not present

## 2015-02-20 DIAGNOSIS — Z23 Encounter for immunization: Secondary | ICD-10-CM | POA: Diagnosis not present

## 2015-03-03 DIAGNOSIS — I480 Paroxysmal atrial fibrillation: Secondary | ICD-10-CM | POA: Diagnosis not present

## 2015-03-03 DIAGNOSIS — Z7901 Long term (current) use of anticoagulants: Secondary | ICD-10-CM | POA: Diagnosis not present

## 2015-03-11 ENCOUNTER — Ambulatory Visit (INDEPENDENT_AMBULATORY_CARE_PROVIDER_SITE_OTHER): Payer: Medicare Other | Admitting: *Deleted

## 2015-03-11 DIAGNOSIS — I4819 Other persistent atrial fibrillation: Secondary | ICD-10-CM

## 2015-03-11 DIAGNOSIS — I481 Persistent atrial fibrillation: Secondary | ICD-10-CM | POA: Diagnosis not present

## 2015-03-12 NOTE — Progress Notes (Signed)
Remote pacemaker transmission.   

## 2015-03-14 ENCOUNTER — Ambulatory Visit (INDEPENDENT_AMBULATORY_CARE_PROVIDER_SITE_OTHER): Payer: Medicare Other | Admitting: Podiatry

## 2015-03-14 ENCOUNTER — Encounter: Payer: Self-pay | Admitting: Podiatry

## 2015-03-14 VITALS — BP 102/73 | HR 60 | Resp 18

## 2015-03-14 DIAGNOSIS — L600 Ingrowing nail: Secondary | ICD-10-CM

## 2015-03-14 DIAGNOSIS — L84 Corns and callosities: Secondary | ICD-10-CM

## 2015-03-14 DIAGNOSIS — M79676 Pain in unspecified toe(s): Secondary | ICD-10-CM

## 2015-03-14 DIAGNOSIS — B351 Tinea unguium: Secondary | ICD-10-CM

## 2015-03-15 ENCOUNTER — Encounter: Payer: Self-pay | Admitting: Podiatry

## 2015-03-15 NOTE — Progress Notes (Signed)
Patient ID: [name], male   DOB: 11-Feb-1932, 81 y.o.   MRN: [id number]  Subjective: 81 y.o. returns the office today for painful, elongated, thickened toenails which he is unable to trim himself. Denies any redness or drainage around the nails. In particular he states the left hallux nail is ingrown causing pain.  Denies any acute changes since last appointment and no new complaints today. Denies any systemic complaints such as fevers, chills, nausea, vomiting.   Objective: AAO 3, NAD DP/PT pulses palpable, CRT less than 3 seconds  Protective sensation decreased with Simms Weinstein monofilament Nails hypertrophic, dystrophic, elongated, brittle, discolored 10.  There is incurvation along the left hallux toenail.There is tenderness overlying these nails 1-5 bilaterally. There is no surrounding erythema or drainage along the nail sites. Bilateral submetatarsal 1 hyperkeratotic lesions. Upon debridement there was no underlying ulceration, drainage or other clinical signs of infection. No open lesions or  otherpre-ulcerative lesions are identified. No other areas of tenderness bilateral lower extremities. No overlying edema, erythema, increased warmth. No pain with calf compression, swelling, warmth, erythema.  Assessment: Patient presents with symptomatic onychomycosis; ingrown toenail; pre-ulcerative calluses  Plan: -Treatment options including alternatives, risks, complications were discussed -Nails sharply debrided 10 without complication/bleeding. Symptomatic portion of the ingrown toenail left hallux toenail was debrided without complication/bleeding. If he continues discussed with him nail avulsion. -Hyperkeratotic lesions sharply debrided 2 without complications last bleeding -Discussed daily foot inspection. If there are any changes, to call the office immediately.  -Follow-up in 3 months or sooner if any problems are to arise. In the meantime, encouraged to call the office  with any questions, concerns, changes symptoms.  Celesta Gentile, DPM

## 2015-03-19 LAB — CUP PACEART REMOTE DEVICE CHECK
Battery Remaining Longevity: 90 mo
Battery Remaining Percentage: 100 %
Date Time Interrogation Session: 20160802063000
Lead Channel Pacing Threshold Amplitude: 0.9 V
Lead Channel Pacing Threshold Pulse Width: 0.4 ms
Lead Channel Setting Pacing Amplitude: 1.4 V
Lead Channel Setting Sensing Sensitivity: 2.5 mV
MDC IDC MSMT LEADCHNL RV IMPEDANCE VALUE: 718 Ohm
MDC IDC SET LEADCHNL RV PACING PULSEWIDTH: 0.4 ms
MDC IDC SET ZONE DETECTION INTERVAL: 375 ms
MDC IDC STAT BRADY RV PERCENT PACED: 68 %
Pulse Gen Serial Number: 113290

## 2015-04-02 ENCOUNTER — Encounter: Payer: Self-pay | Admitting: Cardiology

## 2015-04-04 ENCOUNTER — Encounter: Payer: Self-pay | Admitting: Cardiovascular Disease

## 2015-04-24 DIAGNOSIS — H3531 Nonexudative age-related macular degeneration: Secondary | ICD-10-CM | POA: Diagnosis not present

## 2015-04-24 DIAGNOSIS — I209 Angina pectoris, unspecified: Secondary | ICD-10-CM | POA: Diagnosis not present

## 2015-04-24 DIAGNOSIS — Z6835 Body mass index (BMI) 35.0-35.9, adult: Secondary | ICD-10-CM | POA: Diagnosis not present

## 2015-04-24 DIAGNOSIS — G2581 Restless legs syndrome: Secondary | ICD-10-CM | POA: Diagnosis not present

## 2015-04-24 DIAGNOSIS — I480 Paroxysmal atrial fibrillation: Secondary | ICD-10-CM | POA: Diagnosis not present

## 2015-04-24 DIAGNOSIS — R809 Proteinuria, unspecified: Secondary | ICD-10-CM | POA: Diagnosis not present

## 2015-04-24 DIAGNOSIS — Z23 Encounter for immunization: Secondary | ICD-10-CM | POA: Diagnosis not present

## 2015-04-24 DIAGNOSIS — I10 Essential (primary) hypertension: Secondary | ICD-10-CM | POA: Diagnosis not present

## 2015-04-24 DIAGNOSIS — E785 Hyperlipidemia, unspecified: Secondary | ICD-10-CM | POA: Diagnosis not present

## 2015-04-24 DIAGNOSIS — Z7901 Long term (current) use of anticoagulants: Secondary | ICD-10-CM | POA: Diagnosis not present

## 2015-05-22 DIAGNOSIS — I480 Paroxysmal atrial fibrillation: Secondary | ICD-10-CM | POA: Diagnosis not present

## 2015-05-22 DIAGNOSIS — Z7901 Long term (current) use of anticoagulants: Secondary | ICD-10-CM | POA: Diagnosis not present

## 2015-06-12 ENCOUNTER — Ambulatory Visit (INDEPENDENT_AMBULATORY_CARE_PROVIDER_SITE_OTHER): Payer: Medicare Other | Admitting: *Deleted

## 2015-06-12 DIAGNOSIS — I481 Persistent atrial fibrillation: Secondary | ICD-10-CM | POA: Diagnosis not present

## 2015-06-12 DIAGNOSIS — I4819 Other persistent atrial fibrillation: Secondary | ICD-10-CM

## 2015-06-13 NOTE — Progress Notes (Signed)
Remote pacemaker transmission.   

## 2015-06-20 ENCOUNTER — Ambulatory Visit: Payer: Medicare Other | Admitting: Podiatry

## 2015-06-26 DIAGNOSIS — Z7901 Long term (current) use of anticoagulants: Secondary | ICD-10-CM | POA: Diagnosis not present

## 2015-06-26 DIAGNOSIS — I480 Paroxysmal atrial fibrillation: Secondary | ICD-10-CM | POA: Diagnosis not present

## 2015-07-01 ENCOUNTER — Ambulatory Visit: Payer: Medicare Other | Admitting: Podiatry

## 2015-07-02 ENCOUNTER — Encounter: Payer: Self-pay | Admitting: Cardiology

## 2015-07-02 LAB — CUP PACEART REMOTE DEVICE CHECK
Battery Remaining Percentage: 100 %
Brady Statistic RV Percent Paced: 70 %
Implantable Lead Implant Date: 20130305
Implantable Lead Model: 4137
Implantable Lead Serial Number: 29020819
Lead Channel Setting Pacing Amplitude: 1.4 V
Lead Channel Setting Pacing Pulse Width: 0.4 ms
MDC IDC LEAD LOCATION: 753860
MDC IDC MSMT BATTERY REMAINING LONGEVITY: 90 mo
MDC IDC MSMT LEADCHNL RV IMPEDANCE VALUE: 716 Ohm
MDC IDC MSMT LEADCHNL RV PACING THRESHOLD AMPLITUDE: 0.9 V
MDC IDC MSMT LEADCHNL RV PACING THRESHOLD PULSEWIDTH: 0.4 ms
MDC IDC MSMT LEADCHNL RV SENSING INTR AMPL: 19.1 mV
MDC IDC SESS DTM: 20161103054900
MDC IDC SET LEADCHNL RV SENSING SENSITIVITY: 2.5 mV
Pulse Gen Serial Number: 113290

## 2015-07-07 DIAGNOSIS — I480 Paroxysmal atrial fibrillation: Secondary | ICD-10-CM | POA: Diagnosis not present

## 2015-07-07 DIAGNOSIS — R0609 Other forms of dyspnea: Secondary | ICD-10-CM | POA: Diagnosis not present

## 2015-07-07 DIAGNOSIS — Z6835 Body mass index (BMI) 35.0-35.9, adult: Secondary | ICD-10-CM | POA: Diagnosis not present

## 2015-07-07 DIAGNOSIS — I10 Essential (primary) hypertension: Secondary | ICD-10-CM | POA: Diagnosis not present

## 2015-07-07 DIAGNOSIS — R05 Cough: Secondary | ICD-10-CM | POA: Diagnosis not present

## 2015-07-07 DIAGNOSIS — G4733 Obstructive sleep apnea (adult) (pediatric): Secondary | ICD-10-CM | POA: Diagnosis not present

## 2015-07-23 DIAGNOSIS — Z6835 Body mass index (BMI) 35.0-35.9, adult: Secondary | ICD-10-CM | POA: Diagnosis not present

## 2015-07-23 DIAGNOSIS — I209 Angina pectoris, unspecified: Secondary | ICD-10-CM | POA: Diagnosis not present

## 2015-07-23 DIAGNOSIS — Z7901 Long term (current) use of anticoagulants: Secondary | ICD-10-CM | POA: Diagnosis not present

## 2015-07-23 DIAGNOSIS — G2581 Restless legs syndrome: Secondary | ICD-10-CM | POA: Diagnosis not present

## 2015-07-23 DIAGNOSIS — R0602 Shortness of breath: Secondary | ICD-10-CM | POA: Diagnosis not present

## 2015-07-23 DIAGNOSIS — I480 Paroxysmal atrial fibrillation: Secondary | ICD-10-CM | POA: Diagnosis not present

## 2015-07-23 DIAGNOSIS — R808 Other proteinuria: Secondary | ICD-10-CM | POA: Diagnosis not present

## 2015-07-23 DIAGNOSIS — E668 Other obesity: Secondary | ICD-10-CM | POA: Diagnosis not present

## 2015-07-23 DIAGNOSIS — M109 Gout, unspecified: Secondary | ICD-10-CM | POA: Diagnosis not present

## 2015-07-23 DIAGNOSIS — E7800 Pure hypercholesterolemia, unspecified: Secondary | ICD-10-CM | POA: Diagnosis not present

## 2015-07-23 DIAGNOSIS — I10 Essential (primary) hypertension: Secondary | ICD-10-CM | POA: Diagnosis not present

## 2015-07-23 DIAGNOSIS — N401 Enlarged prostate with lower urinary tract symptoms: Secondary | ICD-10-CM | POA: Diagnosis not present

## 2015-08-20 DIAGNOSIS — Z7901 Long term (current) use of anticoagulants: Secondary | ICD-10-CM | POA: Diagnosis not present

## 2015-08-20 DIAGNOSIS — I480 Paroxysmal atrial fibrillation: Secondary | ICD-10-CM | POA: Diagnosis not present

## 2015-09-09 ENCOUNTER — Ambulatory Visit (INDEPENDENT_AMBULATORY_CARE_PROVIDER_SITE_OTHER): Payer: Medicare Other | Admitting: Cardiovascular Disease

## 2015-09-09 ENCOUNTER — Encounter: Payer: Self-pay | Admitting: Cardiovascular Disease

## 2015-09-09 VITALS — BP 110/58 | HR 58 | Ht 68.0 in | Wt 240.6 lb

## 2015-09-09 DIAGNOSIS — G4730 Sleep apnea, unspecified: Secondary | ICD-10-CM

## 2015-09-09 DIAGNOSIS — Z950 Presence of cardiac pacemaker: Secondary | ICD-10-CM | POA: Diagnosis not present

## 2015-09-09 DIAGNOSIS — I481 Persistent atrial fibrillation: Secondary | ICD-10-CM

## 2015-09-09 DIAGNOSIS — I4819 Other persistent atrial fibrillation: Secondary | ICD-10-CM

## 2015-09-09 DIAGNOSIS — I272 Other secondary pulmonary hypertension: Secondary | ICD-10-CM

## 2015-09-09 DIAGNOSIS — I429 Cardiomyopathy, unspecified: Secondary | ICD-10-CM

## 2015-09-09 DIAGNOSIS — Z7901 Long term (current) use of anticoagulants: Secondary | ICD-10-CM

## 2015-09-09 DIAGNOSIS — I5022 Chronic systolic (congestive) heart failure: Secondary | ICD-10-CM | POA: Insufficient documentation

## 2015-09-09 DIAGNOSIS — I428 Other cardiomyopathies: Secondary | ICD-10-CM

## 2015-09-09 DIAGNOSIS — I5032 Chronic diastolic (congestive) heart failure: Secondary | ICD-10-CM

## 2015-09-09 NOTE — Patient Instructions (Addendum)
Remote monitoring is used to monitor your Pacemaker or ICD from home. This monitoring reduces the number of office visits required to check your device to one time per year. It allows [place] to monitor the functioning of your device to ensure it is working properly. You are scheduled for a device check from home on Dec 08, 2015. You may send your transmission at any time that day. If you have a wireless device, the transmission will be sent automatically. After your physician reviews your transmission, you will receive a postcard with your next transmission date.  Dr. [name] recommends that you schedule a follow-up appointment in: [place] ([place]).

## 2015-09-09 NOTE — Progress Notes (Signed)
Patient ID: [name], male   DOB: Jan 16, 1932, 82 y.o.   MRN: [id number]    Cardiology Office Note    Date:  09/09/2015   ID:  BRENDYN [name], DOB [birth year], MRN [id number]  PCP:  [name], MD  Cardiologist:  Shelva Majestic, M.D.; [name], MD   Chief Complaint  Patient presents with  . Annual Exam    no chest pain, occassional shortness of breath, occassional edema, hascramping in legs, occassional lightheadedness or dizziness    History of Present Illness:  [name] is a 82 y.o. male with long-standing permanent atrial fibrillation with slow ventricular response, single chamber pacemaker, obstructive sleep apnea, systemic hypertension, nonischemic cardiomyopathy with mildly depressed left ventricular systolic function, obesity, gout and BPH.  He denies angina but has occasional edema and occasional exertional dyspnea. He occasionally feels dizzy but this is a less common complaint. He fell asleep repeatedly during the interview today, as he has done in the past. He blames this on waking up early this [place] to today's appointment. He was there for a couple of weeks. As in the past, I expressed concern that his sleep apnea therapy is inadequate. He remains severely obese with a BMI over 36. He does not currently have any edema and he has not taken nitroglycerin in a very long time.  Interrogation of his pacemaker [employer] advantio and planted in 2015) shows normal device function with estimated 7 years of generator longevity, excellent lead parameters and an overall prevalence of ventricular pacing of 71%, despite the fact that he does not take any AV blocking agents. This is of high ventricular rates are rare and very brief and most likely represent atrial fibrillation, rather than ventricular tachycardia  In 2010 he had normal coronary arteries at angiography and LV EF by LV angina was 40-45%, in 2012 echo showed LV  EF of 50-55%. In 2015 EF was 55-60% by echo. A consistent abnormality both by direct cath measurement and echo estimation has been the presence of moderate pulmonary artery hypertension with a PA pressure of around 45 mmHg.  Past Medical History  Diagnosis Date  . Coronary artery disease   . MI (myocardial infarction) ([place])   . Diverticulitis   . Gout   . Permanent atrial fibrillation ([place])   . Nonischemic cardiomyopathy (H[facility])     mild  . OSA on CPAP   . Hyperlipidemia   . Pulmonary hypertension [facility])     Past Surgical History  Procedure Laterality Date  . Replacement total knee    . Shoulder surgery    . Colostomy reversal    . Colostomy    . Coronary angioplasty  06/01/1999    successful to ostium of the first diagonal  . Cardiac catheterization  11/07/2008    nonischemic cardiomyopathy,pulmonary hypertension  . US echocardiography  02/01/2011    LA is mod-severely dilated,AOV & root sclerotic,ca+ AOV leaflets  . Nm myocar perf wall motion  11/24/2007    normal  . Permanent pacemaker insertion  10/04/2012    [employer]  . Esophagogastroduodenoscopy N/A 08/22/2013    Procedure: ESOPHAGOGASTRODUODENOSCOPY (EGD);  Surgeon: [name], MD;  Location: [facility] ENDOSCOPY;  Service: Endoscopy;  Laterality: N/A;  h/p in file cabinet, jackie  . Savory dilation N/A 08/22/2013    Procedure: SAVORY DILATION;  Surgeon: [name], MD;  Location: [facility] ENDOSCOPY;  Service: Endoscopy;  Laterality: N/A;  . Permanent pacemaker  insertion N/A 10/12/2011    Procedure: PERMANENT PACEMAKER INSERTION;  Surgeon: [name], MD;  Location: [facility] CATH LAB;  Service: Cardiovascular;  Laterality: N/A;  . Esophagogastroduodenoscopy N/A 11/05/2014    Procedure: ESOPHAGOGASTRODUODENOSCOPY (EGD);  Surgeon: Clarene Essex, MD;  Location: [facility] ENDOSCOPY;  Service: Endoscopy;  Laterality: N/A;  . Hot hemostasis N/A 11/05/2014    Procedure: HOT HEMOSTASIS (ARGON PLASMA COAGULATION/BICAP);  Surgeon: Clarene Essex, MD;   Location: [facility] ENDOSCOPY;  Service: Endoscopy;  Laterality: N/A;    Outpatient Prescriptions Prior to Visit  Medication Sig Dispense Refill  . allopurinol (ZYLOPRIM) 300 MG tablet Take 300 mg by mouth daily.    [name] COLCRYS 0.6 MG tablet Take 0.6 mg by mouth as needed.    [name] Calcium (STOOL SOFTENER PO) Take 3 tablets by mouth daily.    [name] dutasteride (AVODART) 0.5 MG capsule Take 0.5 mg by mouth daily.    [name] escitalopram (LEXAPRO) 20 MG tablet Take 20 mg by mouth daily.    . furosemide (LASIX) 20 MG tablet Take 20 mg by mouth daily. swelling    . losartan (COZAAR) 100 MG tablet Take 100 mg by mouth daily.    . Multiple Vitamin (MULTIVITAMIN WITH MINERALS) TABS tablet Take 1 tablet by mouth daily.    . nitroGLYCERIN (NITROSTAT) 0.4 MG SL tablet Place 0.4 mg under the tongue every 5 (five) minutes as needed. Chest pain    . potassium chloride SA (K-DUR,KLOR-CON) 20 MEQ tablet Take 20 mEq by mouth daily.     . sertraline (ZOLOFT) 50 MG tablet Take 50 mg by mouth daily.    . Tamsulosin HCl (FLOMAX) 0.4 MG CAPS Take 0.4 mg by mouth daily after breakfast.    . traMADol (ULTRAM) 50 MG tablet Take 50 mg by mouth every 6 (six) hours as needed. For pain    . rOPINIRole (REQUIP) 2 MG tablet Take 2 mg by mouth daily.     . Choline Fenofibrate (TRILIPIX) 135 MG capsule Take 135 mg by mouth daily. Reported on 09/09/2015    . oxyCODONE-acetaminophen (PERCOCET) 5-325 MG per tablet Take 1 tablet by mouth every 4 (four) hours as needed (for pain). (Patient not taking: Reported on 09/09/2015) 20 tablet 0   No facility-administered medications prior to visit.     Allergies:   Hydrocodone and Vicodin   Social History   Social History  . Marital Status: Married    Spouse Name: N/A  . Number of Children: N/A  . Years of Education: N/A   Social History Main Topics  . Smoking status: Never Smoker   . Smokeless tobacco: Never Used  . Alcohol Use: No  . Drug Use: No  . Sexual Activity: Not Asked    Other Topics Concern  . None   Social History Narrative     Family History:  The patient's family history includes Cancer in his mother; Diabetes in his brother; Heart attack in his father.   ROS:   Please see the history of present illness.    ROS All other systems reviewed and are negative.   PHYSICAL EXAM:   VS:  BP 110/58 mmHg  Pulse 58  Ht 5\' 8"  (1.727 m)  Wt 109.118 kg (240 lb 9 oz)  BMI 36.59 kg/m2   GEN: Well nourished, well developed, in no acute distress, obesity limits evaluation of his jugular venous pulsations and delineation of the cardiac apical impulse HEENT: normal Neck: no carotid bruits, or masses Cardiac: irregular; no murmurs, rubs, or gallops,no  edema  Respiratory:  clear to auscultation bilaterally, normal work of breathing GI: soft, nontender, nondistended, + BS MS: no deformity or atrophy Skin: warm and dry, no rash Neuro:  Alert and Oriented x 3, Strength and sensation are intact Psych: euthymic mood, full affect  Wt Readings from Last 3 Encounters:  09/09/15 109.118 kg (240 lb 9 oz)  11/05/14 113.399 kg (250 lb)  09/05/14 115.531 kg (254 lb 11.2 oz)      Studies/Labs Reviewed:   EKG:  EKG is ordered today.  The ekg ordered today demonstrates atrial fibrillation with ventricular pacing. Paced QRS 180 ms, QTC 478 ms  Recent Labs: 11/05/2014: Hemoglobin 11.2*; Platelets 158   Lipid Panel    Component Value Date/Time   CHOL  11/04/2008 1857    173        ATP III CLASSIFICATION:  <200     mg/dL   Desirable  200-239  mg/dL   Borderline High  >=240    mg/dL   High          TRIG 126 11/04/2008 1857   HDL 37* 11/04/2008 1857   CHOLHDL 4.7 11/04/2008 1857   VLDL 25 11/04/2008 1857   LDLCALC * 11/04/2008 1857    111        Total Cholesterol/HDL:CHD Risk Coronary Heart Disease Risk Table                     Men   Women  1/2 Average Risk   3.4   3.3  Average Risk       5.0   4.4  2 X Average Risk   9.6   7.1  3 X Average Risk  23.4    11.0        Use the calculated Patient Ratio above and the CHD Risk Table to determine the patient's CHD Risk.        ATP III CLASSIFICATION (LDL):  <100     mg/dL   Optimal  100-129  mg/dL   Near or Above                    Optimal  130-159  mg/dL   Borderline  160-189  mg/dL   High  >190     mg/dL   Very High     ASSESSMENT:    1. Atrial fibrillation, persistent, slow VR   2. Pacemaker single chamber, [employer], 2013   3. Chronic anticoagulation   4. Nonischemic cardiomyopathy - coronaries by angiography 2010   5. Pulmonary hypertension ([place])   6. Sleep apnea, on C-Pap      PLAN:  In order of problems listed above:  1. AFib: Spontaneous low ventricular rate consistent with AV node disease. Requires a relatively high frequency of ventricular pacing, but now has normal EF. Avoid negative chronotropic agents. CHADSVasc at least 3, high embolic risk on appropriate warfarin anticoagulation. 2. PPM normally functioning single chamber pacemaker arrhythmia remote download in 3 months. Office visit in 6 months 3. No bleeding problems on chronic anticoagulation 4. CMP/chronic diastolic heart failure: steady improvement in LVEF over the years suggests that he may have had tachycardia related cardiomyopathy at the time of initial diagnosis. He is on appropriate treatment with ARB and requires a very low dose of loop diuretic to maintain compensation 5. PAH: The most likely cause of pulmonary hypertension is obesity and insufficiently treated obstructive sleep apnea. 6. OSA: He has missed his appointments in the sleep  clinic and I asked him to reschedule that today. He clearly has daytime hypersomnolence and fatigue. May need adjustment in device settings.    Medication Adjustments/Labs and Tests Ordered: Current medicines are reviewed at length with the patient today.  Concerns regarding medicines are outlined above.  Medication changes, Labs and Tests ordered today are  listed in the Patient Instructions below. Patient Instructions  Remote monitoring is used to monitor your Pacemaker or ICD from home. This monitoring reduces the number of office visits required to check your device to one time per year. It allows [place] to monitor the functioning of your device to ensure it is working properly. You are scheduled for a device check from home on Dec 08, 2015. You may send your transmission at any time that day. If you have a wireless device, the transmission will be sent automatically. After your physician reviews your transmission, you will receive a postcard with your next transmission date.  Dr. [name] recommends that you schedule a follow-up appointment in: [place] ([place]).         Mikael Spray, MD  09/09/2015 5:41 PM    [facility] [address], [place], [place]  [address] Phone: [phone]; Fax: [phone]

## 2015-09-23 DIAGNOSIS — I480 Paroxysmal atrial fibrillation: Secondary | ICD-10-CM | POA: Diagnosis not present

## 2015-09-23 DIAGNOSIS — Z7901 Long term (current) use of anticoagulants: Secondary | ICD-10-CM | POA: Diagnosis not present

## 2015-09-30 DIAGNOSIS — I10 Essential (primary) hypertension: Secondary | ICD-10-CM | POA: Diagnosis not present

## 2015-09-30 DIAGNOSIS — M109 Gout, unspecified: Secondary | ICD-10-CM | POA: Diagnosis not present

## 2015-09-30 DIAGNOSIS — Z125 Encounter for screening for malignant neoplasm of prostate: Secondary | ICD-10-CM | POA: Diagnosis not present

## 2015-10-09 ENCOUNTER — Telehealth: Payer: Self-pay | Admitting: *Deleted

## 2015-10-09 ENCOUNTER — Encounter: Payer: Self-pay | Admitting: Cardiovascular Disease

## 2015-10-09 NOTE — Telephone Encounter (Signed)
Requesting surgical clearance:   1. Type of surgery: COLONOSCOPY - H/O POLYPS  2. Surgeon: DR. Watt Climes   3. Surgical date: PENDING  4. Medications that need to be held: COUMADIN

## 2015-10-09 NOTE — Telephone Encounter (Signed)
Letter through [employer]

## 2015-10-27 ENCOUNTER — Encounter: Payer: Self-pay | Admitting: *Deleted

## 2015-10-28 ENCOUNTER — Ambulatory Visit: Payer: Medicare Other | Admitting: Cardiovascular Disease

## 2015-11-10 ENCOUNTER — Telehealth: Payer: Self-pay | Admitting: Cardiovascular Disease

## 2015-11-10 NOTE — Telephone Encounter (Signed)
Spoke w/ patient regarding recommendations.  He is aware we will follow up with further instruction.  Had recent sleep study documents - CPAP device downloads. Mariann Laster helped me w/ interpretation of this. Report summary indicates machine working adequately & symptoms controlled.  Recommendation for pt to increase nightly hours of use. He was furnished w/ letter of instruction regarding this & voiced compliance.  Routed to verify sleep study advised in this context.

## 2015-11-10 NOTE — Telephone Encounter (Signed)
No unusual findings on device check. Very similar to previous downloads. Please ask him to make a sleep clinic appt.

## 2015-11-10 NOTE — Telephone Encounter (Signed)
He was supposed to reschedule a sleep clinic appt - I think many of his complaints can be attributed to inadequate OSA treatment. Has he scheduled?

## 2015-11-10 NOTE — Telephone Encounter (Signed)
Does not need sleep study, just wanted to verify use of device and appropriate settings

## 2015-11-10 NOTE — Telephone Encounter (Signed)
Returned call. Phone rings w/ no answer or VM pickup.

## 2015-11-10 NOTE — Telephone Encounter (Signed)
Returned call. Pt notes fatigue, intermittent dyspnea x ~2 weeks.  He states the shortness of breath is not all the time and that he has had these symptoms in the past, but not as frequently.  He has had some lightheadedness on standing for a few days. Pt did not report BPs to me. He does not have HR's either, but notes his rate tends to run low. Noted hx of A Fib w/ low VR. He notes he sent a PM download yesterday and is waiting for interpretation of this.  Pt aware I will request further f/u - routed to device pool.

## 2015-11-10 NOTE — Telephone Encounter (Signed)
Remote reviewed. Presenting rhythm is VS/VP. Lead measurements are stable.  Last VHR episode was on 12/29. Patient VP 72% since 11/05/13, was 70% btwn 08/01/13-11/05/13.

## 2015-11-10 NOTE — Telephone Encounter (Signed)
FOLLOW UP   Pt wife returning call

## 2015-11-10 NOTE — Telephone Encounter (Signed)
Pt c/o Shortness Of Breath: STAT if SOB developed within the last 24 hours or pt is noticeably SOB on the phone  1. Are you currently SOB (can you hear that pt is SOB on the phone)? Spoke to wife  2. How long have you been experiencing SOB? 6 months-worse today 3. Are you SOB when sitting or when up moving around? All the time  4. Are you currently experiencing any other symptoms? None  Pls call [phone]

## 2015-11-10 NOTE — Telephone Encounter (Signed)
I will call after I get the download for review. Does device clinic know to expect it?

## 2015-11-11 NOTE — Telephone Encounter (Signed)
Called, no answer when dialed.

## 2015-12-08 ENCOUNTER — Ambulatory Visit (INDEPENDENT_AMBULATORY_CARE_PROVIDER_SITE_OTHER): Payer: Medicare Other | Admitting: *Deleted

## 2015-12-08 DIAGNOSIS — I481 Persistent atrial fibrillation: Secondary | ICD-10-CM

## 2015-12-08 DIAGNOSIS — I4819 Other persistent atrial fibrillation: Secondary | ICD-10-CM

## 2015-12-08 NOTE — Progress Notes (Signed)
Remote pacemaker transmission.   

## 2015-12-09 DIAGNOSIS — Z7901 Long term (current) use of anticoagulants: Secondary | ICD-10-CM | POA: Diagnosis not present

## 2015-12-09 DIAGNOSIS — I480 Paroxysmal atrial fibrillation: Secondary | ICD-10-CM | POA: Diagnosis not present

## 2015-12-23 DIAGNOSIS — I480 Paroxysmal atrial fibrillation: Secondary | ICD-10-CM | POA: Diagnosis not present

## 2015-12-23 DIAGNOSIS — Z7901 Long term (current) use of anticoagulants: Secondary | ICD-10-CM | POA: Diagnosis not present

## 2016-01-05 LAB — CUP PACEART INCLINIC DEVICE CHECK
Date Time Interrogation Session: 20170529215405
Implantable Lead Serial Number: 29020819
MDC IDC LEAD IMPLANT DT: 20130305
MDC IDC LEAD LOCATION: 753860
MDC IDC LEAD MODEL: 4137
MDC IDC PG SERIAL: 113290

## 2016-01-09 ENCOUNTER — Encounter: Payer: Self-pay | Admitting: Cardiovascular Disease

## 2016-01-09 ENCOUNTER — Ambulatory Visit (INDEPENDENT_AMBULATORY_CARE_PROVIDER_SITE_OTHER): Payer: Medicare Other | Admitting: Cardiovascular Disease

## 2016-01-09 VITALS — BP 140/70 | HR 61 | Ht 68.0 in | Wt 233.0 lb

## 2016-01-09 DIAGNOSIS — I429 Cardiomyopathy, unspecified: Secondary | ICD-10-CM

## 2016-01-09 DIAGNOSIS — I481 Persistent atrial fibrillation: Secondary | ICD-10-CM | POA: Diagnosis not present

## 2016-01-09 DIAGNOSIS — I5032 Chronic diastolic (congestive) heart failure: Secondary | ICD-10-CM

## 2016-01-09 DIAGNOSIS — Z7901 Long term (current) use of anticoagulants: Secondary | ICD-10-CM

## 2016-01-09 DIAGNOSIS — I4819 Other persistent atrial fibrillation: Secondary | ICD-10-CM

## 2016-01-09 DIAGNOSIS — I428 Other cardiomyopathies: Secondary | ICD-10-CM

## 2016-01-09 DIAGNOSIS — G4730 Sleep apnea, unspecified: Secondary | ICD-10-CM

## 2016-01-09 DIAGNOSIS — Z950 Presence of cardiac pacemaker: Secondary | ICD-10-CM

## 2016-01-09 NOTE — Patient Instructions (Signed)
Your physician recommends that you continue on your current medications as directed. Please refer to the Current Medication list given to you today.  Dr Claiborne Billings recommends that you schedule a follow-up appointment in 1 year. You will receive a reminder letter in the mail two months in advance. If you don't receive a letter, please call our office to schedule the follow-up appointment.  If you need a refill on your cardiac medications before your next appointment, please call your pharmacy.

## 2016-01-13 ENCOUNTER — Encounter: Payer: Self-pay | Admitting: Cardiovascular Disease

## 2016-01-13 NOTE — Progress Notes (Signed)
Patient ID: [name], male   DOB: 06/20/1932, 82 y.o.   MRN: [id number]    Primary cardiologist: Dr. Recardo [name] Primary M.D.: Dr. Rosana [name]  HPI: [name] is a 82 y.o. male who is followed by Dr.Croitoru for his cardiology care.  He now presents for further evaluation of his obstructive sleep apnea.  [name] has history of long-standing history of permanent atrial fibrillation with a slow ventricular response for which he received a single-chamber pacemaker.  he has a history of hypertension, nonischemic cardiomyopathy with mildly depressed left ventricular systolic function, obesity, gout, and BPH.   In July 2010 he underwent a sleep study and was found to have severe obstructive sleep apnea with an AHI of 59 per hour and an RDI of 71.6 per hour.  He was initially titrated up to 16 cm water pressure on a CPAP study.   He had initially used sleep management solutions and most recently has been using [facility] for his DME He has a RemStar Pro CPAP unit.  He admits to 100% compliance.  Rarely he may fall a sleep before he puts on his CPAP unit.  He has a Mirage FX nasal mask   He also has restless legs and has been on reck with 3 mg daily.  I last saw him 2 years ago at which time I [facility].  His SMS was no longer in business.  He has been maintained on 16 cm water pressure.  Typically he goes to bed around midnight or 1 AM and wakes up between 8 and 10 AM.  He admits to sound sleep.  He is unaware of any breakthrough snoring.  He denies daytime sleepiness.  Occasionally takes a nap after eating.  He feels that he is dreaming well suggestive of achievement of REM sleep.   He admits to occasional edema above his sock line.  He denies palpitations.  He is on warfarin for anticoagulation and denies bleeding.  He presents for 2 year follow-up evaluation.     Past Medical History  Diagnosis Date  . Coronary artery disease   . MI (myocardial infarction) ([place])   .  Diverticulitis   . Gout   . Permanent atrial fibrillation ([place])   . Nonischemic cardiomyopathy ([facility])     mild  . OSA on CPAP   . Hyperlipidemia   . Pulmonary hypertension [facility])     Past Surgical History  Procedure Laterality Date  . Replacement total knee    . Shoulder surgery    . Colostomy reversal    . Colostomy    . Coronary angioplasty  06/01/1999    successful to ostium of the first diagonal  . Cardiac catheterization  11/07/2008    nonischemic cardiomyopathy,pulmonary hypertension  . US echocardiography  02/01/2011    LA is mod-severely dilated,AOV & root sclerotic,ca+ AOV leaflets  . Nm myocar perf wall motion  11/24/2007    normal  . Permanent pacemaker insertion  10/04/2012    [employer]  . Esophagogastroduodenoscopy N/A 08/22/2013    Procedure: ESOPHAGOGASTRODUODENOSCOPY (EGD);  Surgeon: Anthony Columbia, MD;  Location: [facility] ENDOSCOPY;  Service: Endoscopy;  Laterality: N/A;  h/p in file cabinet, jackie  . Savory dilation N/A 08/22/2013    Procedure: SAVORY DILATION;  Surgeon: Anthony Columbia, MD;  Location: [facility] ENDOSCOPY;  Service: Endoscopy;  Laterality: N/A;  . Permanent pacemaker insertion N/A 10/12/2011    Procedure: PERMANENT PACEMAKER INSERTION;  Surgeon: Anthony Klein, MD;  Location: [facility] CATH LAB;  Service: Cardiovascular;  Laterality: N/A;  . Esophagogastroduodenoscopy N/A 11/05/2014    Procedure: ESOPHAGOGASTRODUODENOSCOPY (EGD);  Surgeon: Anthony Essex, MD;  Location: [facility] ENDOSCOPY;  Service: Endoscopy;  Laterality: N/A;  . Hot hemostasis N/A 11/05/2014    Procedure: HOT HEMOSTASIS (ARGON PLASMA COAGULATION/BICAP);  Surgeon: Anthony Essex, MD;  Location: [facility] ENDOSCOPY;  Service: Endoscopy;  Laterality: N/A;    Allergies  Allergen Reactions  . Hydrocodone Nausea Only  . Vicodin [Hydrocodone-Acetaminophen] Nausea And Vomiting    Current Outpatient Prescriptions  Medication Sig Dispense Refill  . allopurinol (ZYLOPRIM) 300 MG tablet Take 300 mg by mouth daily.    [name] COLCRYS  0.6 MG tablet Take 0.6 mg by mouth as needed.    [name] Calcium (STOOL SOFTENER PO) Take 3 tablets by mouth daily.    [name] dutasteride (AVODART) 0.5 MG capsule Take 0.5 mg by mouth daily.    [name] escitalopram (LEXAPRO) 20 MG tablet Take 20 mg by mouth daily.    . fenofibrate 160 MG tablet Take 160 mg by mouth daily.    . furosemide (LASIX) 20 MG tablet Take 20 mg by mouth daily. swelling    . losartan (COZAAR) 100 MG tablet Take 100 mg by mouth daily.    . Multiple Vitamin (MULTIVITAMIN WITH MINERALS) TABS tablet Take 1 tablet by mouth daily.    . nitroGLYCERIN (NITROSTAT) 0.4 MG SL tablet Place 0.4 mg under the tongue every 5 (five) minutes as needed. Chest pain    . potassium chloride SA (K-DUR,KLOR-CON) 20 MEQ tablet Take 20 mEq by mouth daily.     [name] rOPINIRole (REQUIP) 3 MG tablet Take 1 tablet by mouth daily. Take 1 tab daily    . sertraline (ZOLOFT) 50 MG tablet Take 50 mg by mouth daily.    . Tamsulosin HCl (FLOMAX) 0.4 MG CAPS Take 0.4 mg by mouth daily after breakfast.    . traMADol (ULTRAM) 50 MG tablet Take 50 mg by mouth every 6 (six) hours as needed. For pain    . warfarin (COUMADIN) 5 MG tablet Take 1 tablet by mouth daily. Take 1 tab daily except Tuesday take half tab     No current facility-administered medications for this visit.    Social History   Social History  . Marital Status: Married    Spouse Name: N/A  . Number of Children: N/A  . Years of Education: N/A   Occupational History  . Not on file.   Social History Main Topics  . Smoking status: Never Smoker   . Smokeless tobacco: Never Used  . Alcohol Use: No  . Drug Use: No  . Sexual Activity: Not on file   Other Topics Concern  . Not on file   Social History Narrative     ROS General: Negative; No fevers, chills, or night sweats HEENT: Negative; No changes in vision or hearing, sinus congestion, difficulty swallowing Pulmonary: Negative; No cough, wheezing, shortness of breath,  hemoptysis Cardiovascular: Positive for atrial fibrillation and permanent pacemaker; No chest pain, presyncope, syncope, palpatations GI: Negative; No nausea, vomiting, diarrhea, or abdominal pain GU: Negative; No dysuria, hematuria, or difficulty voiding Musculoskeletal: Negative; no myalgias, joint pain, or weakness Hematologic: Negative; no easy bruising, bleeding Endocrine: Negative; no heat/cold intolerance Neuro: Negative; no changes in balance, headaches Skin: Negative; No rashes or skin lesions Psychiatric: Negative; No behavioral problems, depression Sleep: positive for OSA and  restless legs, no bruxism,hypnogognic hallucinations, no cataplexy   Physical Exam BP 140/70 mmHg  Pulse 61  Ht '5\' 8"'  (1.727 m)  Wt 233 lb (105.688 kg)  BMI 35.44 kg/m2  SpO2 96%   Wt Readings from Last 3 Encounters:  01/09/16 233 lb (105.688 kg)  09/09/15 240 lb 9 oz (109.118 kg)  11/05/14 250 lb (113.399 kg)   General: Alert, oriented, no distress.  Skin: normal turgor, no rashes HEENT: Normocephalic, atraumatic. Pupils round and reactive; sclera anicteric; extraocular muscles intact; Fundi arterial narrowing. Nose without nasal septal hypertrophy Mouth/Parynx benign; Mallinpatti scale 3 Neck: No JVD, no carotid briuts Lungs: clear to ausculatation and percussion; no wheezing or rales  Chest wall: No tenderness to palpation Heart: RRR, s1 s2 normal 1/6 systolic murmur; no diastolic murmur.  No rubs thrills or heaves  Abdomen: Mild obesity; soft, nontender; no hepatosplenomehaly, BS+; abdominal aorta nontender and not dilated by palpation. Back: No CVA tenderness Pulses 2+ Extremities:  trace edema above his sock line, right greater than left; no clubbinbg cyanosis, Homan's sign negative  Neurologic: grossly nonfocal; cranial nerves intact. Psychological: Normal affect and mood.  ECG (independently read by me): Ventricular paced rhythm at 61 bpm.    LABS: BMP Latest Ref Rng 08/19/2014  11/08/2008 11/07/2008  Glucose 70 - 99 mg/dL 100(H) 80 108(H)  BUN 6 - 23 mg/dL 39(H) 24(H) 31(H)  Creatinine 0.50 - 1.35 mg/dL 1.30 1.04 1.22  Sodium 135 - 145 mmol/L 139 140 137  Potassium 3.5 - 5.1 mmol/L 4.1 4.7 4.5  Chloride 96 - 112 mEq/L 105 108 100  CO2 19 - 32 mmol/L '28 26 28  ' Calcium 8.4 - 10.5 mg/dL 8.9 8.8 9.6   Hepatic Function Latest Ref Rng 11/04/2008  Total Protein 6.0 - 8.3 g/dL 6.6  Albumin 3.5 - 5.2 g/dL 3.5  AST 0 - 37 U/L 27  ALT 0 - 53 U/L 22  Alk Phosphatase 39 - 117 U/L 39  Total Bilirubin 0.3 - 1.2 mg/dL 0.6    CBC Latest Ref Rng 11/05/2014 08/19/2014 11/08/2008  WBC 4.0 - 10.5 K/uL 4.8 7.2 6.8  Hemoglobin 13.0 - 17.0 g/dL 11.2(L) 10.4(L) 13.9  Hematocrit 39.0 - 52.0 % 35.3(L) 32.6(L) 40.2  Platelets 150 - 400 K/uL 158 158 192   Lipid Panel     Component Value Date/Time   CHOL  11/04/2008 1857    173        ATP III CLASSIFICATION:  <200     mg/dL   Desirable  200-239  mg/dL   Borderline High  >=240    mg/dL   High          TRIG 126 11/04/2008 1857   HDL 37* 11/04/2008 1857   CHOLHDL 4.7 11/04/2008 1857   VLDL 25 11/04/2008 1857   LDLCALC * 11/04/2008 1857    111        Total Cholesterol/HDL:CHD Risk Coronary Heart Disease Risk Table                     Men   Women  1/2 Average Risk   3.4   3.3  Average Risk       5.0   4.4  2 X Average Risk   9.6   7.1  3 X Average Risk  23.4   11.0        Use the calculated Patient Ratio above and the CHD Risk Table to determine the patient's CHD Risk.        ATP III CLASSIFICATION (LDL):  <100     mg/dL  Optimal  100-129  mg/dL   Near or Above                    Optimal  130-159  mg/dL   Borderline  160-189  mg/dL   High  >190     mg/dL   Very High     RADIOLOGY: No results found.    ASSESSMENT AND PLAN: [name] Is an 82 year old Caucasian male who has a history of permanent atrial fibrillation with a slow ventricular response and is status post permanent pacemaker.  He has documented  severe obstructive sleep apnea by his initial diagnostic polysomnogram in July 2010.  He has been on CPAP therapy since the fall of 2010 with his initial download in  demonstrating compliance.  He admits to using the CPAP with 100% compliance.  He continues to have his old Respironics C-Flex REMstar Pro unit.  He is sleeping well.  Per his report, he is sleeping approximately 8 hours per night.  He uses a nasal mask and is at a 16 cm water pressure.  I will try to obtain a download from his unit to make  adjustments if necessary.  He qualifies for new machine.  He will contact [facility] for this download and potential new CPAP unit.  He is ventricular paced.  His blood pressure today is stable on losartan 100 mg, Lasix 20 mg.  He continues to be on anticoagulation with his atrial fibrillation and is without bleeding.  He will follow-up with Dr. [name] for his pacemaker assessment and cardiology care.  I will see him in one year per Medicare guidelines for repeat reevaluation.     Troy Sine, MD, [facility]  01/13/2016 6:16 PM

## 2016-01-16 ENCOUNTER — Encounter: Payer: Self-pay | Admitting: Cardiology

## 2016-01-20 LAB — CUP PACEART REMOTE DEVICE CHECK
Battery Remaining Longevity: 84 mo
Battery Remaining Percentage: 95 %
Brady Statistic RV Percent Paced: 73 %
Date Time Interrogation Session: 20170501070300
Implantable Lead Implant Date: 20130305
Implantable Lead Serial Number: 29020819
Lead Channel Impedance Value: 655 Ohm
Lead Channel Setting Pacing Pulse Width: 0.4 ms
Lead Channel Setting Sensing Sensitivity: 2.5 mV
MDC IDC LEAD LOCATION: 753860
MDC IDC LEAD MODEL: 4137
MDC IDC MSMT LEADCHNL RV PACING THRESHOLD AMPLITUDE: 0.9 V
MDC IDC MSMT LEADCHNL RV PACING THRESHOLD PULSEWIDTH: 0.4 ms
MDC IDC PG SERIAL: 113290
MDC IDC SET LEADCHNL RV PACING AMPLITUDE: 1.3 V

## 2016-01-28 DIAGNOSIS — H2513 Age-related nuclear cataract, bilateral: Secondary | ICD-10-CM | POA: Diagnosis not present

## 2016-01-28 DIAGNOSIS — H52203 Unspecified astigmatism, bilateral: Secondary | ICD-10-CM | POA: Diagnosis not present

## 2016-02-09 DIAGNOSIS — Z7901 Long term (current) use of anticoagulants: Secondary | ICD-10-CM | POA: Diagnosis not present

## 2016-02-09 DIAGNOSIS — I480 Paroxysmal atrial fibrillation: Secondary | ICD-10-CM | POA: Diagnosis not present

## 2016-02-20 DIAGNOSIS — L02413 Cutaneous abscess of right upper limb: Secondary | ICD-10-CM | POA: Diagnosis not present

## 2016-02-23 DIAGNOSIS — L03113 Cellulitis of right upper limb: Secondary | ICD-10-CM | POA: Diagnosis not present

## 2016-02-23 DIAGNOSIS — Z7901 Long term (current) use of anticoagulants: Secondary | ICD-10-CM | POA: Diagnosis not present

## 2016-02-23 DIAGNOSIS — Z6835 Body mass index (BMI) 35.0-35.9, adult: Secondary | ICD-10-CM | POA: Diagnosis not present

## 2016-04-14 ENCOUNTER — Ambulatory Visit (INDEPENDENT_AMBULATORY_CARE_PROVIDER_SITE_OTHER): Payer: Medicare Other | Admitting: Podiatry

## 2016-04-14 DIAGNOSIS — M79671 Pain in right foot: Secondary | ICD-10-CM | POA: Diagnosis not present

## 2016-04-14 DIAGNOSIS — I872 Venous insufficiency (chronic) (peripheral): Secondary | ICD-10-CM | POA: Diagnosis not present

## 2016-04-14 DIAGNOSIS — M7751 Other enthesopathy of right foot: Secondary | ICD-10-CM

## 2016-04-14 DIAGNOSIS — L97512 Non-pressure chronic ulcer of other part of right foot with fat layer exposed: Secondary | ICD-10-CM

## 2016-04-14 DIAGNOSIS — M779 Enthesopathy, unspecified: Secondary | ICD-10-CM

## 2016-04-14 DIAGNOSIS — G5791 Unspecified mononeuropathy of right lower limb: Secondary | ICD-10-CM | POA: Diagnosis not present

## 2016-04-14 DIAGNOSIS — M778 Other enthesopathies, not elsewhere classified: Secondary | ICD-10-CM

## 2016-04-14 MED ORDER — BETAMETHASONE SOD PHOS & ACET 6 (3-3) MG/ML IJ SUSP: 12.0000 mg | Freq: Once | INTRAMUSCULAR | Status: DC

## 2016-04-14 NOTE — Progress Notes (Signed)
Subjective: Patient presents today for evaluation and treatment of pain to the right foot as well as a ulcer noted to the second digit of the right foot. These are new complaints presenting today other than the usual routine nail care performed The patient has unable to ambulate without pain. Patient also presents with a new complaint of neuritis and nerve pain to the right foot as well as an ulcer to the distal tuft of the second digit.   Objective: Physical Exam General: The patient is alert and oriented x3 in no acute distress.  Dermatology: Ulceration present to the distal tuft of the second digit measuring 0.3 cm in diameter. Periwound edges are hyperkeratotic with callus lesion. Skin is warm, dry and supple bilateral lower extremities. Negative for open lesions or macerations.  Vascular: Palpable pedal pulses bilaterally. No edema or erythema noted. Capillary refill within normal limits.  Neurological: Pain on palpation and compression of the metatarsal heads to the second interspace of the right foot. Consistent with findings of aneuritis or neuroma.  Epicritic and protective threshold grossly intact bilaterally.   Musculoskeletal Exam: Range of motion within normal limits to all pedal and ankle joints bilateral. Muscle strength 5/5 in all groups bilateral.    Assessment: #1 ulcer second digit right foot down to and including subcutaneous tissue. The wound measures 0.3 cm x 0.3 cm x 0.3 cm in length by width by depth dimensions secondary to venous insufficiency and peripheral vascular disease. #2 neuritis second interspace right foot #3 pain and right foot #4 rigid hammertoe contractures digits 2 through 5 left foot #5 venous insufficiency with varicosities bilateral Problem List Items Addressed This Visit    None    Visit Diagnoses   None.       Plan of Care:  #1 Patient was evaluated. #2 Medically necessary excisional debridement including subcutaneous change tissue was  performed using a chisel blade and tissue nipper to the second digit ulcer of the right foot excisional debridement of all necrotic nonviable tissue down to healthy bleeding viable tissue was performed with post-debridement measurements and was pre-.  #3 injection of 0.5 mL Celestone Soluspan injected into the second interspace of the right foot #4 patient is to return to clinic in 3 weeks #5 patient was instructed to dress the ulceration daily using Betadine and sterile Band-Aid   Dr. Edrick Kins, [place]

## 2016-05-03 ENCOUNTER — Ambulatory Visit: Payer: Medicare Other | Admitting: Podiatry

## 2016-05-06 DIAGNOSIS — H25811 Combined forms of age-related cataract, right eye: Secondary | ICD-10-CM | POA: Diagnosis not present

## 2016-05-06 DIAGNOSIS — H2511 Age-related nuclear cataract, right eye: Secondary | ICD-10-CM | POA: Diagnosis not present

## 2016-05-20 ENCOUNTER — Ambulatory Visit (INDEPENDENT_AMBULATORY_CARE_PROVIDER_SITE_OTHER): Payer: Medicare Other | Admitting: Podiatry

## 2016-05-20 DIAGNOSIS — L97919 Non-pressure chronic ulcer of unspecified part of right lower leg with unspecified severity: Secondary | ICD-10-CM

## 2016-05-20 DIAGNOSIS — L97512 Non-pressure chronic ulcer of other part of right foot with fat layer exposed: Secondary | ICD-10-CM

## 2016-05-20 DIAGNOSIS — M79671 Pain in right foot: Secondary | ICD-10-CM

## 2016-05-20 DIAGNOSIS — I83019 Varicose veins of right lower extremity with ulcer of unspecified site: Secondary | ICD-10-CM

## 2016-05-20 DIAGNOSIS — G5791 Unspecified mononeuropathy of right lower limb: Secondary | ICD-10-CM

## 2016-05-20 DIAGNOSIS — I872 Venous insufficiency (chronic) (peripheral): Secondary | ICD-10-CM | POA: Diagnosis not present

## 2016-05-21 NOTE — Progress Notes (Signed)
Subjective: Patient presents today for follow-up evaluation of the distal tuft ulcer to the second digit right foot. Patient is not diabetic. Patient states he has been diagnosed as prediabetic. Patient also relates significant bilateral foot pain. With painful callus lesion to the plantar aspect of the third digit right foot. Patient did receive an injection last time for neuritis which she states did help.  Objective: Physical Exam General: The patient is alert and oriented x3 in no acute distress.  Dermatology: Ulceration present to the distal tuft of the second digit measuring 0.20.20.2cm (LxWxD). Periwound edges are hyperkeratotic with callus lesion. Skin is warm, dry and supple bilateral lower extremities. Negative for open lesions or macerations.  Vascular: Palpable pedal pulses bilaterally. No edema or erythema noted. Capillary refill within normal limits.  Neurological: Pain on palpation and compression of the metatarsal heads to the second interspace of the right foot. Consistent with findings of aneuritis or neuroma.  Epicritic and protective threshold grossly intact bilaterally.   Musculoskeletal Exam: Range of motion within normal limits to all pedal and ankle joints bilateral. Muscle strength 5/5 in all groups bilateral.    Assessment: #1 ulcer second digit right foot down to and including subcutaneous tissue. The wound measures [phone] cm in length by width by depth dimensions secondary to venous insufficiency and peripheral vascular disease. #2 neuritis second interspace right foot #3 pain and right foot #4 rigid hammertoe contractures digits 2 through 5 left foot #5 venous insufficiency with varicosities bilateral   Plan of Care:  #1 Patient was evaluated. #2 Medically necessary excisional debridement including subcutaneous change tissue was performed using a chisel blade and tissue nipper to the second digit ulcer of the right foot excisional debridement of all  necrotic nonviable tissue down to healthy bleeding viable tissue was performed with post-debridement measurements and was pre-.  #3 today diabetic shoes were recommended with Plastizote inserts. Patient understands that this is a Scientist, water quality pain and not covered by insurance due to the fact that he is not diabetic.  #5 patient was instructed to dress the ulceration daily using Betadine and sterile Band-Aid   Dr. Edrick Kins, [place]

## 2016-06-07 ENCOUNTER — Ambulatory Visit (INDEPENDENT_AMBULATORY_CARE_PROVIDER_SITE_OTHER): Payer: Medicare Other | Admitting: Podiatry

## 2016-06-07 DIAGNOSIS — L608 Other nail disorders: Secondary | ICD-10-CM | POA: Diagnosis not present

## 2016-06-07 DIAGNOSIS — B351 Tinea unguium: Secondary | ICD-10-CM

## 2016-06-07 DIAGNOSIS — M79676 Pain in unspecified toe(s): Secondary | ICD-10-CM

## 2016-06-07 DIAGNOSIS — L97512 Non-pressure chronic ulcer of other part of right foot with fat layer exposed: Secondary | ICD-10-CM

## 2016-06-07 DIAGNOSIS — E08621 Diabetes mellitus due to underlying condition with foot ulcer: Secondary | ICD-10-CM | POA: Diagnosis not present

## 2016-06-07 DIAGNOSIS — L97509 Non-pressure chronic ulcer of other part of unspecified foot with unspecified severity: Secondary | ICD-10-CM

## 2016-06-07 DIAGNOSIS — E0843 Diabetes mellitus due to underlying condition with diabetic autonomic (poly)neuropathy: Secondary | ICD-10-CM | POA: Diagnosis not present

## 2016-06-07 DIAGNOSIS — M79675 Pain in left toe(s): Secondary | ICD-10-CM | POA: Diagnosis not present

## 2016-06-07 DIAGNOSIS — E1143 Type 2 diabetes mellitus with diabetic autonomic (poly)neuropathy: Secondary | ICD-10-CM

## 2016-06-07 DIAGNOSIS — I70235 Atherosclerosis of native arteries of right leg with ulceration of other part of foot: Secondary | ICD-10-CM

## 2016-06-07 NOTE — Patient Instructions (Addendum)
Shoe and Insoles Instructions   Congratulations on receiving your new shoes and insoles.  They have been selected from our own inventory or have been special ordered to provide you with the optimum comfort and protection. In order to receive the greatest benefit from this footwear, please follow these suggested guidelines.   Initial use of your shoes It may take a couple of days for you to feel fully comfortable wearing your new diabetic shoes and insoles. Some people are immediately comfortable wearing them, while others need more time to adjust. Generally, the body does not adapt to change rapidly and you may experience mild aches or fatigue when you first begin to wear your shoes.  Wear these shoes as long as they are comfortable. Try to only wear them indoors until you feel that they are comfortable for outdoor use.  If they bother your feet, TAKE THEM OFF and try them again a few hours later or the next day. Check for any soreness or swelling and notify your doctor immediately if you notice any of these signs. Otherwise, increase wear time as they become more comfortable. If you feel like your shoes are bothersome still after trying these tips, please call our office. Keep in mind, that once the outer sole of the shoe becomes dirty, we can no longer send them back.  The insoles should be changed every 4 months and replaced with a new pair.   Maintenance of your new shoes Your new diabetic shoes can be washed with mild soap and water and air dried. Try to keep away from high heat sources (heaters, dryers, fireplaces, etc.) as this may damage or distort the plastazote lining and insert in the shoe. Do NOT machine wash or use harsh chemicals such as bleach on your shoes. These shoes, if properly taken care of, should last one year.   Continuing Care Patients have fewer foot complications if they have been properly fitted in the correct type of footwear and accommodating insoles. The desired outcome  is to fit you with the most appropriate, best fitting shoe possible in order to reduce the risk of and prevent foot complications, which could ultimately lead to ulceration, infection and amputation.   REMEMBER TO SEE YOUR PODIATRIST FOR REGULAR FOOT EXAMS AT LEAST ONCE PER YEAR.    ANTIBACTERIAL SOAP INSTRUCTIONS  THE DAY AFTER PROCEDURE  Please follow the instructions your doctor has marked.   Shower as usual. Before getting out, place a drop of antibacterial liquid soap (Dial) on a wet, clean washcloth.  Gently wipe washcloth over affected area.  Afterward, rinse the area with warm water.  Blot the area dry with a soft cloth and cover with antibiotic ointment (neosporin, polysporin, bacitracin) and band aid or gauze and tape  Place 3-4 drops of antibacterial liquid soap in a quart of warm tap water.  Submerge foot into water for 20 minutes.  If bandage was applied after your procedure, leave on to allow for easy lift off, then remove and continue with soak for the remaining time.  Next, blot area dry with a soft cloth and cover with a bandage.  Apply other medications as directed by your doctor, such as cortisporin otic solution (eardrops) or neosporin antibiotic ointment

## 2016-06-13 NOTE — Progress Notes (Signed)
Subjective: Patient presents today for follow-up evaluation of the distal tuft ulcer to the second digit right foot. Patient is not diabetic. Patient states he has been diagnosed as prediabetic. Patient also relates significant bilateral foot pain. With painful callus lesion to the plantar aspect of the third digit right foot. Patient also complains of a new complaint of a painful left great toenail which hurt with shoe gear. Patient is unable to wear closed toed shoes without pain. Patient states the pains been ongoing for several months. Patient did receive an injection last time for neuritis which she states did help.  Objective: Physical Exam General: The patient is alert and oriented x3 in no acute distress.  Dermatology: Ulceration present to the distal tuft of the second digit measuring 0.20.20.2cm (LxWxD). Periwound edges are hyperkeratotic with callus lesion. Skin is warm, dry and supple bilateral lower extremities. Negative for open lesions or macerations. Painful, hyperkeratotic, dystrophic, onychomycotic toenail noted to the left great toe. Painful on palpation.  Vascular: Palpable pedal pulses bilaterally. No edema or erythema noted. Capillary refill within normal limits.  Neurological: Pain on palpation and compression of the metatarsal heads to the second interspace of the right foot. Consistent with findings of aneuritis or neuroma.  Epicritic and protective threshold grossly intact bilaterally.   Musculoskeletal Exam: Range of motion within normal limits to all pedal and ankle joints bilateral. Muscle strength 5/5 in all groups bilateral.    Assessment: #1 ulcer second digit right foot down to and including subcutaneous tissue. The wound measures [id number] cm in length by width by depth dimensions secondary to venous insufficiency and peripheral vascular disease. #2 neuritis second interspace right foot #3 pain and right foot #4 rigid hammertoe contractures digits 2  through 5 left foot #5 venous insufficiency with varicosities bilateral #6 painful, hyperkeratotic, dystrophic left great toenail   Plan of Care:  #1 Patient was evaluated. #2 Medically necessary excisional debridement including subcutaneous change tissue was performed using a chisel blade and tissue nipper to the second digit ulcer of the right foot excisional debridement of all necrotic nonviable tissue down to healthy bleeding viable tissue was performed with post-debridement measurements and was pre-.  #3 total permanent nail avulsion was performed to the left great toenail. Local infiltration of a 50-50 mixture of 2% lidocaine plain with 0.5% Marcaine plain was utilized. [id number] seconds applications of phenol was utilized followed by alcohol flush. Light dressing was applied. #4 silicone pads dispensed digits 2-3 left foot #5 patient is to return to clinic in 2 weeks   Dr. Edrick Kins, [place]

## 2016-06-17 DIAGNOSIS — H25812 Combined forms of age-related cataract, left eye: Secondary | ICD-10-CM | POA: Diagnosis not present

## 2016-06-17 DIAGNOSIS — H2512 Age-related nuclear cataract, left eye: Secondary | ICD-10-CM | POA: Diagnosis not present

## 2016-06-21 ENCOUNTER — Ambulatory Visit (INDEPENDENT_AMBULATORY_CARE_PROVIDER_SITE_OTHER): Payer: Medicare Other | Admitting: Podiatry

## 2016-06-21 DIAGNOSIS — L03032 Cellulitis of left toe: Secondary | ICD-10-CM

## 2016-06-21 DIAGNOSIS — S91109D Unspecified open wound of unspecified toe(s) without damage to nail, subsequent encounter: Secondary | ICD-10-CM

## 2016-06-21 DIAGNOSIS — M79676 Pain in unspecified toe(s): Secondary | ICD-10-CM

## 2016-06-21 MED ORDER — SULFAMETHOXAZOLE-TRIMETHOPRIM 800-160 MG PO TABS: 1.0000 | ORAL_TABLET | Freq: Two times a day (BID) | ORAL | 0 refills | Status: DC

## 2016-06-22 ENCOUNTER — Telehealth: Payer: Self-pay | Admitting: *Deleted

## 2016-06-22 NOTE — Telephone Encounter (Signed)
Dr. Amalia Hailey reviewed notification of interaction between Warfarin and Bactrim DS, states continue the short course of the antibiotic. Informed Ms [name] - [employer] of Dr. Amalia Hailey orders.

## 2016-07-04 NOTE — Progress Notes (Signed)
Subjective: Patient presents today 2 weeks post total permanent nail avulsion procedure to the left great toe. Patient states that the toe and nail fold is feeling much better.  Objective: Skin is warm, dry and supple. There is mild localized erythema noted to the left great toe spaces for cellulitic reaction. Open wound to the associated nail fold with a granular wound base and moderate amount of fibrotic tissue. Minimal drainage noted. Mild erythema around the periungual region likely due to phenol chemical matricectomy.  Assessment: #1 postop permanent total nail avulsion left great toe #2 open wound periungual borders of respective digit.  #3 localized cellulitis left great toe  Plan of care: #1 patient was evaluated  #2 debridement of open wound was performed to the periungual border of the respective toe using a currette. Antibiotic ointment and Band-Aid was applied. #3 prescription for Bactrim DS 2 weeks #4 return to clinic in 2 weeks

## 2016-07-05 ENCOUNTER — Ambulatory Visit (INDEPENDENT_AMBULATORY_CARE_PROVIDER_SITE_OTHER): Payer: Medicare Other | Admitting: Podiatry

## 2016-07-05 ENCOUNTER — Ambulatory Visit: Payer: Medicare Other | Admitting: Podiatry

## 2016-07-05 ENCOUNTER — Encounter: Payer: Self-pay | Admitting: Podiatry

## 2016-07-05 DIAGNOSIS — Z7901 Long term (current) use of anticoagulants: Secondary | ICD-10-CM | POA: Diagnosis not present

## 2016-07-05 DIAGNOSIS — E08621 Diabetes mellitus due to underlying condition with foot ulcer: Secondary | ICD-10-CM | POA: Diagnosis not present

## 2016-07-05 DIAGNOSIS — B351 Tinea unguium: Secondary | ICD-10-CM | POA: Diagnosis not present

## 2016-07-05 DIAGNOSIS — L03032 Cellulitis of left toe: Secondary | ICD-10-CM

## 2016-07-05 DIAGNOSIS — L608 Other nail disorders: Secondary | ICD-10-CM

## 2016-07-05 DIAGNOSIS — E1143 Type 2 diabetes mellitus with diabetic autonomic (poly)neuropathy: Secondary | ICD-10-CM

## 2016-07-05 DIAGNOSIS — S91109D Unspecified open wound of unspecified toe(s) without damage to nail, subsequent encounter: Secondary | ICD-10-CM

## 2016-07-05 DIAGNOSIS — L97512 Non-pressure chronic ulcer of other part of right foot with fat layer exposed: Secondary | ICD-10-CM | POA: Diagnosis not present

## 2016-07-05 DIAGNOSIS — M79675 Pain in left toe(s): Secondary | ICD-10-CM | POA: Diagnosis not present

## 2016-07-05 DIAGNOSIS — E0843 Diabetes mellitus due to underlying condition with diabetic autonomic (poly)neuropathy: Secondary | ICD-10-CM | POA: Diagnosis not present

## 2016-07-05 DIAGNOSIS — S91209D Unspecified open wound of unspecified toe(s) with damage to nail, subsequent encounter: Secondary | ICD-10-CM

## 2016-07-05 DIAGNOSIS — M79676 Pain in unspecified toe(s): Secondary | ICD-10-CM

## 2016-07-05 DIAGNOSIS — L97509 Non-pressure chronic ulcer of other part of unspecified foot with unspecified severity: Secondary | ICD-10-CM | POA: Diagnosis not present

## 2016-07-08 LAB — WOUND CULTURE
GRAM STAIN: NONE SEEN
GRAM STAIN: NONE SEEN
GRAM STAIN: NONE SEEN

## 2016-07-18 NOTE — Progress Notes (Signed)
Subjective:  Patient presents today for follow-up evaluation of a total permanent nail avulsion to the left great toe. Patient also presents for follow-up evaluation of an ulceration to the second digit right foot. Patient has a history of diabetes mellitus. On the last visit on 06/21/2016, there was a very localized cellulitis around the avulsion site of the left great toe. At that time prescription for Bactrim DS 2 weeks was provided. Patient presents today for further treatment and evaluation    Objective/Physical Exam General: The patient is alert and oriented x3 in no acute distress.  Dermatology: There continues to be a localized cellulitis with minimal purulent drainage about the permanent total nail avulsion site of the left great toe. There is no open wound to the periungual borders of the respective digit. Wound base is granular with a minimal amount of serosanguineous drainage noted. No malodor in the periwound integrity is intact. There is no exposed bone muscle-tendon ligament or joint.  Skin is warm, dry and supple bilateral lower extremities. Negative for open lesions or macerations.  Vascular: Palpable pedal pulses bilaterally. No edema or erythema noted. Capillary refill within normal limits.  Neurological: Epicritic and protective threshold diminished bilaterally.   Musculoskeletal Exam: Range of motion within normal limits to all pedal and ankle joints bilateral. Muscle strength 5/5 in all groups bilateral.    Assessment: #1 postop permanent total nail avulsion left great toe: Date of procedure 06/07/2016. #2 localized cellulitis left great toe #3 pain in left great toe #4 ulcer second digit right foot-healed #5 diabetes mellitus with neuropathy   Plan of Care:  #1 Patient was evaluated. #2 due to the nonhealing nature of the post total nail avulsion site, cultures were taken today and pathology. #3 medically necessary excisional debridement of the open wound site of  the left great toe was performed using a tissue nipper and curette. Excisional debridement of all necrotic nonviable tissue down to healthy bleeding viable tissue was performed. #4 antibiotic ointment and a Band-Aid was applied  #5 return to clinic in 2 weeks   Dr. Edrick Kins, [place]

## 2016-07-20 ENCOUNTER — Telehealth: Payer: Self-pay | Admitting: *Deleted

## 2016-07-20 MED ORDER — LEVOFLOXACIN 500 MG PO TABS: 500.0000 mg | ORAL_TABLET | Freq: Every day | ORAL | 0 refills | Status: DC

## 2016-07-20 NOTE — Telephone Encounter (Addendum)
-----   Message from Edrick Kins, DPM sent at 07/18/2016  3:50 PM EST ----- Please contact patient and ask how his left great toe is doing (patient had a total permanent nail avulsion procedure performed on 06/07/16).   Last visit cultures were taken positive for Pseudomonas. If his toe is doing well, then no need for antibiotic or follow-up appt. If toe is painful, red, draining, etc, then call in Levaquin QD x 10 days and make follow-up appt.   THANKS!!! Dr. Amalia Hailey 07/20/2016-Left message informing pt I had information concerning his cultures performed by Dr. Amalia Hailey and I would try his mobile phone. Pt's wife, Freda Munro returned call and states pt's toe is still red and angry. I told Freda Munro that Dr. Amalia Hailey had wanted to add an antibiotic Levaquin, and have pt seen. Freda Munro states understanding and I transferred to schedulers. D. Miner - scheduler gave pt 2:45pm appt.

## 2016-07-21 ENCOUNTER — Ambulatory Visit (INDEPENDENT_AMBULATORY_CARE_PROVIDER_SITE_OTHER): Payer: Medicare Other | Admitting: Podiatry

## 2016-07-21 ENCOUNTER — Telehealth: Payer: Self-pay | Admitting: *Deleted

## 2016-07-21 DIAGNOSIS — M79672 Pain in left foot: Secondary | ICD-10-CM

## 2016-07-21 DIAGNOSIS — M79676 Pain in unspecified toe(s): Secondary | ICD-10-CM

## 2016-07-21 DIAGNOSIS — L851 Acquired keratosis [keratoderma] palmaris et plantaris: Secondary | ICD-10-CM

## 2016-07-21 DIAGNOSIS — S91109D Unspecified open wound of unspecified toe(s) without damage to nail, subsequent encounter: Secondary | ICD-10-CM

## 2016-07-21 DIAGNOSIS — S91209D Unspecified open wound of unspecified toe(s) with damage to nail, subsequent encounter: Secondary | ICD-10-CM

## 2016-07-21 DIAGNOSIS — M79671 Pain in right foot: Secondary | ICD-10-CM

## 2016-07-21 DIAGNOSIS — L84 Corns and callosities: Secondary | ICD-10-CM

## 2016-07-21 NOTE — Telephone Encounter (Addendum)
Pharmacy - [facility] states Levaquin can alter Coumadin levels, does Dr. Amalia Hailey want to continue the therapy? 07/21/2016-Faxed rx staing Dr. Amalia Hailey wanted to continue the Levaquin.

## 2016-07-25 NOTE — Telephone Encounter (Signed)
Yes. Limited abx options due to bacterial resistance. Patient aware of risks.  Dr. Amalia Hailey

## 2016-07-30 NOTE — Progress Notes (Signed)
Subjective:  Patient presents today for follow-up evaluation of a total permanent nail avulsion to the left great toe. Patient also presents for follow-up evaluation of an ulceration to the second digit right foot. Patient does not have a history of diabetes mellitus. On the last visit on 07/05/2016, there was a very localized cellulitis around the avulsion site of the left great toe. Patient states that he took his Bactrim DS for 2 weeks. No alleviation of cellulitis or symptoms. Patient presents today for further treatment and evaluation    Objective/Physical Exam General: The patient is alert and oriented x3 in no acute distress.  Dermatology: There continues to be a localized cellulitis with minimal purulent drainage about the permanent total nail avulsion site of the left great toe. There is no open wound to the periungual borders of the respective digit. Wound base is granular with a minimal amount of serosanguineous drainage noted. No malodor in the periwound integrity is intact. There is no exposed bone muscle-tendon ligament or joint.  Skin is warm, dry and supple bilateral lower extremities. Negative for open lesions or macerations. Hyperkeratotic painful callus lesions noted to the bilateral forefoot 3  Vascular: Palpable pedal pulses bilaterally. No edema or erythema noted. Capillary refill within normal limits.  Neurological: Epicritic and protective threshold diminished bilaterally.   Musculoskeletal Exam: Range of motion within normal limits to all pedal and ankle joints bilateral. Muscle strength 5/5 in all groups bilateral.    Assessment: #1 postop permanent total nail avulsion left great toe: Date of procedure 06/07/2016. #2 localized cellulitis left great toe #3 pain in left great toe #4 ulcer second digit right foot-healed #5 diabetes mellitus with neuropathy #6 painful callus lesions bilateral forefoot 3   Plan of Care:  #1 Patient was evaluated. #2 cultures were  reviewed today. There is moderate resistance 2 different antibiotic therapy based on culture results. Today the patient was prescribed Levaquin 10 days despite being on blood thinners. Patient understands the risks. #3 medically necessary excisional debridement of the open wound site of the left great toe toenail avulsion site was performed using a tissue nipper and curette. Excisional debridement of all necrotic nonviable tissue down to healthy bleeding viable tissue was performed. #4 antibiotic ointment and a Band-Aid was applied  #5 return to clinic in 2 weeks   Dr. Edrick Kins, [place]

## 2016-08-05 ENCOUNTER — Ambulatory Visit: Payer: Medicare Other | Admitting: Podiatry

## 2016-08-16 DIAGNOSIS — I8310 Varicose veins of unspecified lower extremity with inflammation: Secondary | ICD-10-CM | POA: Diagnosis not present

## 2016-08-16 DIAGNOSIS — Z6837 Body mass index (BMI) 37.0-37.9, adult: Secondary | ICD-10-CM | POA: Diagnosis not present

## 2016-08-16 DIAGNOSIS — Z7901 Long term (current) use of anticoagulants: Secondary | ICD-10-CM | POA: Diagnosis not present

## 2016-08-16 DIAGNOSIS — R609 Edema, unspecified: Secondary | ICD-10-CM | POA: Diagnosis not present

## 2016-08-16 DIAGNOSIS — I480 Paroxysmal atrial fibrillation: Secondary | ICD-10-CM | POA: Diagnosis not present

## 2016-08-18 ENCOUNTER — Ambulatory Visit (INDEPENDENT_AMBULATORY_CARE_PROVIDER_SITE_OTHER): Payer: Medicare Other | Admitting: Podiatry

## 2016-08-18 DIAGNOSIS — L603 Nail dystrophy: Secondary | ICD-10-CM | POA: Diagnosis not present

## 2016-08-18 DIAGNOSIS — M79672 Pain in left foot: Secondary | ICD-10-CM

## 2016-08-18 DIAGNOSIS — I83025 Varicose veins of left lower extremity with ulcer other part of foot: Secondary | ICD-10-CM

## 2016-08-18 DIAGNOSIS — B351 Tinea unguium: Secondary | ICD-10-CM | POA: Diagnosis not present

## 2016-08-18 DIAGNOSIS — L608 Other nail disorders: Secondary | ICD-10-CM

## 2016-08-18 DIAGNOSIS — L97522 Non-pressure chronic ulcer of other part of left foot with fat layer exposed: Secondary | ICD-10-CM | POA: Diagnosis not present

## 2016-08-18 DIAGNOSIS — L97529 Non-pressure chronic ulcer of other part of left foot with unspecified severity: Principal | ICD-10-CM

## 2016-08-18 DIAGNOSIS — L84 Corns and callosities: Secondary | ICD-10-CM

## 2016-08-18 DIAGNOSIS — M79671 Pain in right foot: Secondary | ICD-10-CM

## 2016-08-18 DIAGNOSIS — M79609 Pain in unspecified limb: Secondary | ICD-10-CM

## 2016-08-18 DIAGNOSIS — L851 Acquired keratosis [keratoderma] palmaris et plantaris: Secondary | ICD-10-CM

## 2016-08-21 ENCOUNTER — Emergency Department (HOSPITAL_BASED_OUTPATIENT_CLINIC_OR_DEPARTMENT_OTHER): Payer: Medicare Other

## 2016-08-21 ENCOUNTER — Emergency Department (HOSPITAL_BASED_OUTPATIENT_CLINIC_OR_DEPARTMENT_OTHER)
Admission: EM | Admit: 2016-08-21 | Discharge: 2016-08-21 | Disposition: A | Discharge status: Home / Self Care | Payer: Medicare Other | Attending: Emergency Medicine | Admitting: Emergency Medicine

## 2016-08-21 ENCOUNTER — Encounter (HOSPITAL_BASED_OUTPATIENT_CLINIC_OR_DEPARTMENT_OTHER): Payer: Self-pay | Admitting: Emergency Medicine

## 2016-08-21 DIAGNOSIS — W228XXA Striking against or struck by other objects, initial encounter: Secondary | ICD-10-CM | POA: Insufficient documentation

## 2016-08-21 DIAGNOSIS — Y999 Unspecified external cause status: Secondary | ICD-10-CM | POA: Diagnosis not present

## 2016-08-21 DIAGNOSIS — S4991XA Unspecified injury of right shoulder and upper arm, initial encounter: Secondary | ICD-10-CM | POA: Diagnosis present

## 2016-08-21 DIAGNOSIS — I2510 Atherosclerotic heart disease of native coronary artery without angina pectoris: Secondary | ICD-10-CM | POA: Diagnosis not present

## 2016-08-21 DIAGNOSIS — Z7901 Long term (current) use of anticoagulants: Secondary | ICD-10-CM | POA: Diagnosis not present

## 2016-08-21 DIAGNOSIS — Y9389 Activity, other specified: Secondary | ICD-10-CM | POA: Insufficient documentation

## 2016-08-21 DIAGNOSIS — Y929 Unspecified place or not applicable: Secondary | ICD-10-CM | POA: Insufficient documentation

## 2016-08-21 DIAGNOSIS — Z950 Presence of cardiac pacemaker: Secondary | ICD-10-CM | POA: Diagnosis not present

## 2016-08-21 DIAGNOSIS — M25511 Pain in right shoulder: Secondary | ICD-10-CM | POA: Diagnosis not present

## 2016-08-21 DIAGNOSIS — I5032 Chronic diastolic (congestive) heart failure: Secondary | ICD-10-CM | POA: Insufficient documentation

## 2016-08-21 MED ORDER — OXYCODONE-ACETAMINOPHEN 5-325 MG PO TABS: 1.0000 | ORAL_TABLET | Freq: Four times a day (QID) | ORAL | 0 refills | Status: DC | PRN

## 2016-08-21 MED ORDER — HYDROMORPHONE HCL 1 MG/ML IJ SOLN
2.0000 mg | Freq: Once | INTRAMUSCULAR | Status: AC
  Administered 2016-08-21: 2 mg via INTRAMUSCULAR
  Filled 2016-08-21: qty 2

## 2016-08-21 MED ORDER — PREDNISONE 10 MG PO TABS: 20.0000 mg | ORAL_TABLET | Freq: Two times a day (BID) | ORAL | 0 refills | Status: DC

## 2016-08-21 NOTE — ED Notes (Signed)
Patient took 2 percocet's prior to arrival, also took his daily ultram and a dose of an anti inflammatory prior to arrival
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
## 2016-08-21 NOTE — ED Notes (Signed)
Patient reports that he is having pain to his right shoulder, Patient is severly kyphotic - no deformity noted, however unable to assess fully related to the patient kyphosis and pain.  The patient is moaning loudly in pain with any  Movement or touching

## 2016-08-21 NOTE — ED Triage Notes (Signed)
Patient hurt his right shoulder yesterday  - reports that he is having pain from his right thumb up into his right shoulder

## 2016-08-21 NOTE — Discharge Instructions (Signed)
Prednisone as prescribed.  Percocet as prescribed as needed for pain.  Follow-up with an orthopedic surgeon if you're not improving in the next week, and return to the ER if your symptoms significantly worsen or change.

## 2016-08-22 NOTE — Progress Notes (Signed)
Subjective:  Patient presents today for follow-up evaluation of a total permanent nail avulsion to the left great toe. Patient also presents for follow-up evaluation of an ulceration to the second digit right foot. Patient does not have a history of diabetes mellitus. Patient's overall condition for multiple symptoms have improved greatly. There continues to be a ulceration to the left great toe.    Objective/Physical Exam General: The patient is alert and oriented x3 in no acute distress.  Dermatology: Wound #1 motive noted to the left great toe total nail avulsion site. Wound measures approximately [phone] cm (LxWxD). To the noted ulceration there is no eschar. There is minimal amount of serosanguineous drainage noted. Granulation tissue is red. There is no exposed bone muscle-tendon ligament or joint. Periwound integrity is intact.  Hyperkeratotic, elongated, painful nails noted 1-5 bilateral. Skin is warm, dry and supple bilateral lower extremities. Hyperkeratotic painful callus lesions noted to the right foot 2  Vascular: Palpable pedal pulses bilaterally. No edema or erythema noted. Capillary refill within normal limits. Varicosities noted bilateral lower extremities.  Neurological: Epicritic and protective threshold diminished bilaterally.   Musculoskeletal Exam: Range of motion within normal limits to all pedal and ankle joints bilateral. Muscle strength 5/5 in all groups bilateral.    Assessment: #1 ulcer left great toe secondary to venous insufficiency  #2 callus right foot 2  #3 painful, thickened, elongated onychomycosis digits 1-5 bilateral     Plan of Care:  #1 Patient was evaluated. #2 medically necessary excisional debridement including subcutaneous tissues performed to the left great toe ulceration site. Excisional debridement of all the necrotic nonviable tissue down to healthy bleeding viable tissue was performed with post-debridement measurements same as  pre-. #3 wound was cleansed with normal saline and dry sterile dressing applied. #4 excisional debridement of painful callus lesions was performed using a chisel blade without incident or bleeding 2 right foot #5 mechanical debridement of nails 1-5 bilaterally performed using a nail nipper without incident. #6 return to clinic in 2 weeks  Dr. Edrick Kins, [place]

## 2016-08-24 DIAGNOSIS — Z6837 Body mass index (BMI) 37.0-37.9, adult: Secondary | ICD-10-CM | POA: Diagnosis not present

## 2016-08-24 DIAGNOSIS — Z7901 Long term (current) use of anticoagulants: Secondary | ICD-10-CM | POA: Diagnosis not present

## 2016-08-24 DIAGNOSIS — I480 Paroxysmal atrial fibrillation: Secondary | ICD-10-CM | POA: Diagnosis not present

## 2016-08-24 DIAGNOSIS — S0093XA Contusion of unspecified part of head, initial encounter: Secondary | ICD-10-CM | POA: Diagnosis not present

## 2016-08-24 DIAGNOSIS — M25511 Pain in right shoulder: Secondary | ICD-10-CM | POA: Diagnosis not present

## 2016-08-27 DIAGNOSIS — M19011 Primary osteoarthritis, right shoulder: Secondary | ICD-10-CM | POA: Diagnosis not present

## 2016-09-13 ENCOUNTER — Ambulatory Visit: Payer: Medicare Other | Admitting: Podiatry

## 2016-09-17 DIAGNOSIS — M25511 Pain in right shoulder: Secondary | ICD-10-CM | POA: Diagnosis not present

## 2016-09-17 DIAGNOSIS — M75121 Complete rotator cuff tear or rupture of right shoulder, not specified as traumatic: Secondary | ICD-10-CM | POA: Diagnosis not present

## 2016-09-17 DIAGNOSIS — M5030 Other cervical disc degeneration, unspecified cervical region: Secondary | ICD-10-CM | POA: Diagnosis not present

## 2016-09-26 DIAGNOSIS — I8391 Asymptomatic varicose veins of right lower extremity: Secondary | ICD-10-CM | POA: Diagnosis not present

## 2016-09-26 DIAGNOSIS — L02821 Furuncle of head [any part, except face]: Secondary | ICD-10-CM | POA: Diagnosis not present

## 2016-09-26 DIAGNOSIS — L02831 Carbuncle of head [any part, except face]: Secondary | ICD-10-CM | POA: Diagnosis not present

## 2016-09-26 DIAGNOSIS — I8390 Asymptomatic varicose veins of unspecified lower extremity: Secondary | ICD-10-CM | POA: Diagnosis not present

## 2016-09-27 DIAGNOSIS — M75121 Complete rotator cuff tear or rupture of right shoulder, not specified as traumatic: Secondary | ICD-10-CM | POA: Diagnosis not present

## 2016-09-27 DIAGNOSIS — M25511 Pain in right shoulder: Secondary | ICD-10-CM | POA: Diagnosis not present

## 2016-09-27 DIAGNOSIS — Z5181 Encounter for therapeutic drug level monitoring: Secondary | ICD-10-CM | POA: Diagnosis not present

## 2016-09-27 DIAGNOSIS — Z7901 Long term (current) use of anticoagulants: Secondary | ICD-10-CM | POA: Diagnosis not present

## 2016-09-27 DIAGNOSIS — R937 Abnormal findings on diagnostic imaging of other parts of musculoskeletal system: Secondary | ICD-10-CM | POA: Diagnosis not present

## 2016-10-01 DIAGNOSIS — M19011 Primary osteoarthritis, right shoulder: Secondary | ICD-10-CM | POA: Diagnosis not present

## 2016-10-01 DIAGNOSIS — M25511 Pain in right shoulder: Secondary | ICD-10-CM | POA: Diagnosis not present

## 2016-10-01 DIAGNOSIS — M75121 Complete rotator cuff tear or rupture of right shoulder, not specified as traumatic: Secondary | ICD-10-CM | POA: Diagnosis not present

## 2016-10-05 DIAGNOSIS — I10 Essential (primary) hypertension: Secondary | ICD-10-CM | POA: Diagnosis not present

## 2016-10-05 DIAGNOSIS — E7800 Pure hypercholesterolemia, unspecified: Secondary | ICD-10-CM | POA: Diagnosis not present

## 2016-10-05 DIAGNOSIS — Z125 Encounter for screening for malignant neoplasm of prostate: Secondary | ICD-10-CM | POA: Diagnosis not present

## 2016-10-05 DIAGNOSIS — R8299 Other abnormal findings in urine: Secondary | ICD-10-CM | POA: Diagnosis not present

## 2016-10-05 DIAGNOSIS — M109 Gout, unspecified: Secondary | ICD-10-CM | POA: Diagnosis not present

## 2016-10-12 DIAGNOSIS — N401 Enlarged prostate with lower urinary tract symptoms: Secondary | ICD-10-CM | POA: Diagnosis not present

## 2016-10-12 DIAGNOSIS — I209 Angina pectoris, unspecified: Secondary | ICD-10-CM | POA: Diagnosis not present

## 2016-10-12 DIAGNOSIS — E7800 Pure hypercholesterolemia, unspecified: Secondary | ICD-10-CM | POA: Diagnosis not present

## 2016-10-12 DIAGNOSIS — I480 Paroxysmal atrial fibrillation: Secondary | ICD-10-CM | POA: Diagnosis not present

## 2016-10-12 DIAGNOSIS — G4733 Obstructive sleep apnea (adult) (pediatric): Secondary | ICD-10-CM | POA: Diagnosis not present

## 2016-10-12 DIAGNOSIS — Z0000 Encounter for general adult medical examination without abnormal findings: Secondary | ICD-10-CM | POA: Diagnosis not present

## 2016-10-12 DIAGNOSIS — G2581 Restless legs syndrome: Secondary | ICD-10-CM | POA: Diagnosis not present

## 2016-10-12 DIAGNOSIS — D500 Iron deficiency anemia secondary to blood loss (chronic): Secondary | ICD-10-CM | POA: Diagnosis not present

## 2016-10-12 DIAGNOSIS — F419 Anxiety disorder, unspecified: Secondary | ICD-10-CM | POA: Diagnosis not present

## 2016-10-12 DIAGNOSIS — I10 Essential (primary) hypertension: Secondary | ICD-10-CM | POA: Diagnosis not present

## 2016-10-12 DIAGNOSIS — Z6838 Body mass index (BMI) 38.0-38.9, adult: Secondary | ICD-10-CM | POA: Diagnosis not present

## 2016-10-12 DIAGNOSIS — Z1389 Encounter for screening for other disorder: Secondary | ICD-10-CM | POA: Diagnosis not present

## 2016-10-17 DIAGNOSIS — D539 Nutritional anemia, unspecified: Secondary | ICD-10-CM | POA: Diagnosis not present

## 2016-10-17 DIAGNOSIS — D6832 Hemorrhagic disorder due to extrinsic circulating anticoagulants: Secondary | ICD-10-CM | POA: Diagnosis not present

## 2016-10-17 DIAGNOSIS — R040 Epistaxis: Secondary | ICD-10-CM | POA: Diagnosis not present

## 2016-10-17 DIAGNOSIS — I8391 Asymptomatic varicose veins of right lower extremity: Secondary | ICD-10-CM | POA: Diagnosis not present

## 2016-10-21 ENCOUNTER — Telehealth: Payer: Self-pay | Admitting: *Deleted

## 2016-10-21 NOTE — Telephone Encounter (Signed)
Left detailed message on patient's home answering machine outlining Dr [facility] recommendations on lab studies received from [facility]. Call back if questions and/or concerns.

## 2016-10-26 DIAGNOSIS — R58 Hemorrhage, not elsewhere classified: Secondary | ICD-10-CM | POA: Diagnosis not present

## 2016-10-26 DIAGNOSIS — I83892 Varicose veins of left lower extremities with other complications: Secondary | ICD-10-CM | POA: Diagnosis not present

## 2016-10-26 DIAGNOSIS — I8392 Asymptomatic varicose veins of left lower extremity: Secondary | ICD-10-CM | POA: Diagnosis not present

## 2016-10-27 DIAGNOSIS — Z7901 Long term (current) use of anticoagulants: Secondary | ICD-10-CM | POA: Diagnosis not present

## 2016-10-27 DIAGNOSIS — I8311 Varicose veins of right lower extremity with inflammation: Secondary | ICD-10-CM | POA: Diagnosis not present

## 2016-10-27 DIAGNOSIS — L97211 Non-pressure chronic ulcer of right calf limited to breakdown of skin: Secondary | ICD-10-CM | POA: Diagnosis not present

## 2016-10-27 DIAGNOSIS — I83202 Varicose veins of unspecified lower extremity with both ulcer of calf and inflammation: Secondary | ICD-10-CM | POA: Diagnosis not present

## 2016-10-27 DIAGNOSIS — I83891 Varicose veins of right lower extremities with other complications: Secondary | ICD-10-CM | POA: Diagnosis not present

## 2016-10-28 DIAGNOSIS — I83202 Varicose veins of unspecified lower extremity with both ulcer of calf and inflammation: Secondary | ICD-10-CM | POA: Diagnosis not present

## 2016-10-28 DIAGNOSIS — I8311 Varicose veins of right lower extremity with inflammation: Secondary | ICD-10-CM | POA: Diagnosis not present

## 2016-10-28 DIAGNOSIS — I83891 Varicose veins of right lower extremities with other complications: Secondary | ICD-10-CM | POA: Diagnosis not present

## 2016-10-28 DIAGNOSIS — L97211 Non-pressure chronic ulcer of right calf limited to breakdown of skin: Secondary | ICD-10-CM | POA: Diagnosis not present

## 2016-10-29 DIAGNOSIS — L0889 Other specified local infections of the skin and subcutaneous tissue: Secondary | ICD-10-CM | POA: Diagnosis not present

## 2016-10-29 DIAGNOSIS — L0211 Cutaneous abscess of neck: Secondary | ICD-10-CM | POA: Diagnosis not present

## 2016-10-29 DIAGNOSIS — L738 Other specified follicular disorders: Secondary | ICD-10-CM | POA: Diagnosis not present

## 2016-10-29 DIAGNOSIS — L570 Actinic keratosis: Secondary | ICD-10-CM | POA: Diagnosis not present

## 2016-10-29 DIAGNOSIS — B958 Unspecified staphylococcus as the cause of diseases classified elsewhere: Secondary | ICD-10-CM | POA: Diagnosis not present

## 2016-11-01 ENCOUNTER — Telehealth: Payer: Self-pay | Admitting: Cardiovascular Disease

## 2016-11-01 NOTE — Telephone Encounter (Signed)
Pt of Dr. [name]  Returned call and spoke to wife. Voiced that the patient was down at [facility] visiting son, had some issues with his varicose veins bleeding spontaneously. Since he's on coumadin they were bleeding a lot.  BP was also "running higher than usual".  No other acute complaints.  Wife informs me BP meds adjusted and coumadin level checked on recent visit to PCP (Dr. Osborne Casco).  She also informs me patient has appt Thursday w vein specialist.  After thorough discussion, no other evident concerns, I advised that he probably just needs to follow up on these concerns there, but I would send concerns to Dr. [name] to see if a sooner than routine f/u visit warranted.  Wife agreeable to this and voiced thanks for call.

## 2016-11-01 NOTE — Telephone Encounter (Signed)
Patient wife calling for husband states that he needs to come in to be seen asap. She would like to know if he can be worked with [employer] this week. [name] states that they have been out of town and while out of town, the patient's varicose vein has bleed out about 5 or 6 times.Please call to discuss, thanks.

## 2016-11-01 NOTE — Telephone Encounter (Signed)
Thanks for keeping [place] posted, but I agree that it seems issues are appropriately addressed. MCr

## 2016-11-01 NOTE — Telephone Encounter (Signed)
Left msg w recommendations.

## 2016-11-03 ENCOUNTER — Encounter (HOSPITAL_COMMUNITY): Payer: Self-pay | Admitting: *Deleted

## 2016-11-03 ENCOUNTER — Emergency Department (HOSPITAL_COMMUNITY): Payer: Medicare Other

## 2016-11-03 ENCOUNTER — Inpatient Hospital Stay (HOSPITAL_COMMUNITY)
Admission: EM | Admit: 2016-11-03 | Discharge: 2016-11-10 | DRG: 682 | Disposition: A | Discharge status: Home / Self Care | Payer: Medicare Other | Attending: Internal Medicine | Admitting: Internal Medicine

## 2016-11-03 DIAGNOSIS — K269 Duodenal ulcer, unspecified as acute or chronic, without hemorrhage or perforation: Secondary | ICD-10-CM | POA: Diagnosis not present

## 2016-11-03 DIAGNOSIS — Z79891 Long term (current) use of opiate analgesic: Secondary | ICD-10-CM

## 2016-11-03 DIAGNOSIS — Z9861 Coronary angioplasty status: Secondary | ICD-10-CM

## 2016-11-03 DIAGNOSIS — N401 Enlarged prostate with lower urinary tract symptoms: Secondary | ICD-10-CM | POA: Diagnosis present

## 2016-11-03 DIAGNOSIS — E785 Hyperlipidemia, unspecified: Secondary | ICD-10-CM | POA: Diagnosis present

## 2016-11-03 DIAGNOSIS — G4733 Obstructive sleep apnea (adult) (pediatric): Secondary | ICD-10-CM | POA: Diagnosis present

## 2016-11-03 DIAGNOSIS — R443 Hallucinations, unspecified: Secondary | ICD-10-CM | POA: Diagnosis not present

## 2016-11-03 DIAGNOSIS — J90 Pleural effusion, not elsewhere classified: Secondary | ICD-10-CM | POA: Diagnosis not present

## 2016-11-03 DIAGNOSIS — E669 Obesity, unspecified: Secondary | ICD-10-CM | POA: Diagnosis present

## 2016-11-03 DIAGNOSIS — K922 Gastrointestinal hemorrhage, unspecified: Secondary | ICD-10-CM | POA: Diagnosis not present

## 2016-11-03 DIAGNOSIS — G9340 Encephalopathy, unspecified: Secondary | ICD-10-CM | POA: Diagnosis present

## 2016-11-03 DIAGNOSIS — N183 Chronic kidney disease, stage 3 (moderate): Secondary | ICD-10-CM | POA: Diagnosis not present

## 2016-11-03 DIAGNOSIS — R0902 Hypoxemia: Secondary | ICD-10-CM | POA: Diagnosis present

## 2016-11-03 DIAGNOSIS — K761 Chronic passive congestion of liver: Secondary | ICD-10-CM | POA: Diagnosis present

## 2016-11-03 DIAGNOSIS — Z7901 Long term (current) use of anticoagulants: Secondary | ICD-10-CM | POA: Diagnosis not present

## 2016-11-03 DIAGNOSIS — R7401 Elevation of levels of liver transaminase levels: Secondary | ICD-10-CM

## 2016-11-03 DIAGNOSIS — Z79899 Other long term (current) drug therapy: Secondary | ICD-10-CM

## 2016-11-03 DIAGNOSIS — L97919 Non-pressure chronic ulcer of unspecified part of right lower leg with unspecified severity: Secondary | ICD-10-CM | POA: Diagnosis present

## 2016-11-03 DIAGNOSIS — R0600 Dyspnea, unspecified: Secondary | ICD-10-CM | POA: Diagnosis not present

## 2016-11-03 DIAGNOSIS — Z950 Presence of cardiac pacemaker: Secondary | ICD-10-CM | POA: Diagnosis not present

## 2016-11-03 DIAGNOSIS — I5022 Chronic systolic (congestive) heart failure: Secondary | ICD-10-CM | POA: Diagnosis present

## 2016-11-03 DIAGNOSIS — D649 Anemia, unspecified: Secondary | ICD-10-CM | POA: Diagnosis not present

## 2016-11-03 DIAGNOSIS — I5032 Chronic diastolic (congestive) heart failure: Secondary | ICD-10-CM | POA: Diagnosis not present

## 2016-11-03 DIAGNOSIS — N179 Acute kidney failure, unspecified: Secondary | ICD-10-CM | POA: Diagnosis not present

## 2016-11-03 DIAGNOSIS — I83009 Varicose veins of unspecified lower extremity with ulcer of unspecified site: Secondary | ICD-10-CM | POA: Diagnosis present

## 2016-11-03 DIAGNOSIS — D62 Acute posthemorrhagic anemia: Secondary | ICD-10-CM | POA: Diagnosis present

## 2016-11-03 DIAGNOSIS — Z885 Allergy status to narcotic agent status: Secondary | ICD-10-CM

## 2016-11-03 DIAGNOSIS — R609 Edema, unspecified: Secondary | ICD-10-CM | POA: Diagnosis not present

## 2016-11-03 DIAGNOSIS — I83899 Varicose veins of unspecified lower extremities with other complications: Secondary | ICD-10-CM | POA: Diagnosis present

## 2016-11-03 DIAGNOSIS — I480 Paroxysmal atrial fibrillation: Secondary | ICD-10-CM | POA: Diagnosis present

## 2016-11-03 DIAGNOSIS — M109 Gout, unspecified: Secondary | ICD-10-CM | POA: Diagnosis present

## 2016-11-03 DIAGNOSIS — K319 Disease of stomach and duodenum, unspecified: Secondary | ICD-10-CM | POA: Diagnosis present

## 2016-11-03 DIAGNOSIS — D6832 Hemorrhagic disorder due to extrinsic circulating anticoagulants: Secondary | ICD-10-CM | POA: Diagnosis not present

## 2016-11-03 DIAGNOSIS — D6869 Other thrombophilia: Secondary | ICD-10-CM | POA: Diagnosis not present

## 2016-11-03 DIAGNOSIS — Z9119 Patient's noncompliance with other medical treatment and regimen: Secondary | ICD-10-CM

## 2016-11-03 DIAGNOSIS — I248 Other forms of acute ischemic heart disease: Secondary | ICD-10-CM | POA: Diagnosis present

## 2016-11-03 DIAGNOSIS — I482 Chronic atrial fibrillation: Secondary | ICD-10-CM | POA: Diagnosis present

## 2016-11-03 DIAGNOSIS — E875 Hyperkalemia: Secondary | ICD-10-CM | POA: Diagnosis present

## 2016-11-03 DIAGNOSIS — Z96659 Presence of unspecified artificial knee joint: Secondary | ICD-10-CM | POA: Diagnosis present

## 2016-11-03 DIAGNOSIS — I2721 Secondary pulmonary arterial hypertension: Secondary | ICD-10-CM | POA: Diagnosis present

## 2016-11-03 DIAGNOSIS — F329 Major depressive disorder, single episode, unspecified: Secondary | ICD-10-CM | POA: Diagnosis present

## 2016-11-03 DIAGNOSIS — I517 Cardiomegaly: Secondary | ICD-10-CM | POA: Diagnosis not present

## 2016-11-03 DIAGNOSIS — N170 Acute kidney failure with tubular necrosis: Principal | ICD-10-CM | POA: Diagnosis present

## 2016-11-03 DIAGNOSIS — Z7952 Long term (current) use of systemic steroids: Secondary | ICD-10-CM

## 2016-11-03 DIAGNOSIS — I5033 Acute on chronic diastolic (congestive) heart failure: Secondary | ICD-10-CM | POA: Diagnosis present

## 2016-11-03 DIAGNOSIS — Z6838 Body mass index (BMI) 38.0-38.9, adult: Secondary | ICD-10-CM

## 2016-11-03 DIAGNOSIS — J9811 Atelectasis: Secondary | ICD-10-CM | POA: Diagnosis not present

## 2016-11-03 DIAGNOSIS — R740 Nonspecific elevation of levels of transaminase and lactic acid dehydrogenase [LDH]: Secondary | ICD-10-CM | POA: Diagnosis not present

## 2016-11-03 DIAGNOSIS — I130 Hypertensive heart and chronic kidney disease with heart failure and stage 1 through stage 4 chronic kidney disease, or unspecified chronic kidney disease: Secondary | ICD-10-CM | POA: Diagnosis present

## 2016-11-03 DIAGNOSIS — I481 Persistent atrial fibrillation: Secondary | ICD-10-CM | POA: Diagnosis present

## 2016-11-03 DIAGNOSIS — F32A Depression, unspecified: Secondary | ICD-10-CM | POA: Diagnosis present

## 2016-11-03 DIAGNOSIS — R0989 Other specified symptoms and signs involving the circulatory and respiratory systems: Secondary | ICD-10-CM | POA: Diagnosis present

## 2016-11-03 DIAGNOSIS — I252 Old myocardial infarction: Secondary | ICD-10-CM | POA: Diagnosis not present

## 2016-11-03 DIAGNOSIS — R410 Disorientation, unspecified: Secondary | ICD-10-CM | POA: Diagnosis not present

## 2016-11-03 DIAGNOSIS — D500 Iron deficiency anemia secondary to blood loss (chronic): Secondary | ICD-10-CM | POA: Diagnosis not present

## 2016-11-03 DIAGNOSIS — K264 Chronic or unspecified duodenal ulcer with hemorrhage: Secondary | ICD-10-CM | POA: Diagnosis not present

## 2016-11-03 DIAGNOSIS — G5791 Unspecified mononeuropathy of right lower limb: Secondary | ICD-10-CM

## 2016-11-03 DIAGNOSIS — R338 Other retention of urine: Secondary | ICD-10-CM | POA: Diagnosis present

## 2016-11-03 DIAGNOSIS — I2510 Atherosclerotic heart disease of native coronary artery without angina pectoris: Secondary | ICD-10-CM | POA: Diagnosis present

## 2016-11-03 DIAGNOSIS — Z794 Long term (current) use of insulin: Secondary | ICD-10-CM | POA: Diagnosis not present

## 2016-11-03 DIAGNOSIS — I5082 Biventricular heart failure: Secondary | ICD-10-CM | POA: Diagnosis present

## 2016-11-03 DIAGNOSIS — R0602 Shortness of breath: Secondary | ICD-10-CM | POA: Diagnosis not present

## 2016-11-03 DIAGNOSIS — L299 Pruritus, unspecified: Secondary | ICD-10-CM | POA: Diagnosis present

## 2016-11-03 DIAGNOSIS — D509 Iron deficiency anemia, unspecified: Secondary | ICD-10-CM | POA: Diagnosis not present

## 2016-11-03 DIAGNOSIS — N281 Cyst of kidney, acquired: Secondary | ICD-10-CM | POA: Diagnosis not present

## 2016-11-03 DIAGNOSIS — I4819 Other persistent atrial fibrillation: Secondary | ICD-10-CM | POA: Diagnosis present

## 2016-11-03 DIAGNOSIS — N400 Enlarged prostate without lower urinary tract symptoms: Secondary | ICD-10-CM | POA: Diagnosis present

## 2016-11-03 DIAGNOSIS — R195 Other fecal abnormalities: Secondary | ICD-10-CM | POA: Diagnosis not present

## 2016-11-03 DIAGNOSIS — N289 Disorder of kidney and ureter, unspecified: Secondary | ICD-10-CM | POA: Diagnosis present

## 2016-11-03 DIAGNOSIS — I428 Other cardiomyopathies: Secondary | ICD-10-CM | POA: Diagnosis present

## 2016-11-03 DIAGNOSIS — L02811 Cutaneous abscess of head [any part, except face]: Secondary | ICD-10-CM | POA: Diagnosis present

## 2016-11-03 DIAGNOSIS — B958 Unspecified staphylococcus as the cause of diseases classified elsewhere: Secondary | ICD-10-CM | POA: Diagnosis present

## 2016-11-03 DIAGNOSIS — Z8249 Family history of ischemic heart disease and other diseases of the circulatory system: Secondary | ICD-10-CM

## 2016-11-03 LAB — BASIC METABOLIC PANEL
ANION GAP: 10 (ref 5–15)
Anion gap: 10 (ref 5–15)
Anion gap: 11 (ref 5–15)
Anion gap: 11 (ref 5–15)
BUN: 84 mg/dL — ABNORMAL HIGH (ref 6–20)
BUN: 85 mg/dL — AB (ref 6–20)
BUN: 84 mg/dL — ABNORMAL HIGH (ref 6–20)
BUN: 85 mg/dL — AB (ref 6–20)
CALCIUM: 9.2 mg/dL (ref 8.9–10.3)
CHLORIDE: 102 mmol/L (ref 101–111)
CO2: 22 mmol/L (ref 22–32)
CO2: 21 mmol/L — ABNORMAL LOW (ref 22–32)
CO2: 23 mmol/L (ref 22–32)
CO2: 23 mmol/L (ref 22–32)
CREATININE: 3.22 mg/dL — AB (ref 0.61–1.24)
Calcium: 9 mg/dL (ref 8.9–10.3)
Calcium: 9.1 mg/dL (ref 8.9–10.3)
Calcium: 9.2 mg/dL (ref 8.9–10.3)
Chloride: 104 mmol/L (ref 101–111)
Chloride: 103 mmol/L (ref 101–111)
Chloride: 103 mmol/L (ref 101–111)
Creatinine, Ser: 3.35 mg/dL — ABNORMAL HIGH (ref 0.61–1.24)
Creatinine, Ser: 3.36 mg/dL — ABNORMAL HIGH (ref 0.61–1.24)
Creatinine, Ser: 3.3 mg/dL — ABNORMAL HIGH (ref 0.61–1.24)
GFR calc Af Amer: 18 mL/min — ABNORMAL LOW (ref >60)
GFR calc Af Amer: 18 mL/min — ABNORMAL LOW (ref >60)
GFR calc Af Amer: 19 mL/min — ABNORMAL LOW (ref >60)
GFR calc non Af Amer: 16 mL/min — ABNORMAL LOW (ref >60)
GFR calc non Af Amer: 16 mL/min — ABNORMAL LOW (ref >60)
GFR calc non Af Amer: 16 mL/min — ABNORMAL LOW (ref >60)
GFR, EST AFRICAN AMERICAN: 18 mL/min — AB (ref >60)
GFR, EST NON AFRICAN AMERICAN: 16 mL/min — AB (ref >60)
GLUCOSE: 86 mg/dL (ref 65–99)
GLUCOSE: 87 mg/dL (ref 65–99)
GLUCOSE: 81 mg/dL (ref 65–99)
Glucose, Bld: 109 mg/dL — ABNORMAL HIGH (ref 65–99)
POTASSIUM: 6 mmol/L — AB (ref 3.5–5.1)
POTASSIUM: 6.3 mmol/L — AB (ref 3.5–5.1)
POTASSIUM: 5.8 mmol/L — AB (ref 3.5–5.1)
Potassium: 5.7 mmol/L — ABNORMAL HIGH (ref 3.5–5.1)
Sodium: 136 mmol/L (ref 135–145)
Sodium: 135 mmol/L (ref 135–145)
Sodium: 136 mmol/L (ref 135–145)
Sodium: 136 mmol/L (ref 135–145)

## 2016-11-03 LAB — I-STAT TROPONIN, ED
Troponin i, poc: 0.11 ng/mL (ref 0.00–0.08)
Troponin i, poc: 0.13 ng/mL (ref 0.00–0.08)

## 2016-11-03 LAB — CBC
HCT: 23.3 % — ABNORMAL LOW (ref 39.0–52.0)
HEMATOCRIT: 23.6 % — AB (ref 39.0–52.0)
HEMOGLOBIN: 7.6 g/dL — AB (ref 13.0–17.0)
HEMOGLOBIN: 7.4 g/dL — AB (ref 13.0–17.0)
MCH: 33 pg (ref 26.0–34.0)
MCH: 32.5 pg (ref 26.0–34.0)
MCHC: 32.2 g/dL (ref 30.0–36.0)
MCHC: 31.8 g/dL (ref 30.0–36.0)
MCV: 102.6 fL — ABNORMAL HIGH (ref 78.0–100.0)
MCV: 102.2 fL — AB (ref 78.0–100.0)
Platelets: 160 10*3/uL (ref 150–400)
Platelets: 174 10*3/uL (ref 150–400)
RBC: 2.3 MIL/uL — ABNORMAL LOW (ref 4.22–5.81)
RBC: 2.28 MIL/uL — AB (ref 4.22–5.81)
RDW: 16.7 % — ABNORMAL HIGH (ref 11.5–15.5)
RDW: 17 % — ABNORMAL HIGH (ref 11.5–15.5)
WBC: 4.4 10*3/uL (ref 4.0–10.5)
WBC: 4.7 10*3/uL (ref 4.0–10.5)

## 2016-11-03 LAB — HEPATIC FUNCTION PANEL
ALK PHOS: 51 U/L (ref 38–126)
ALT: 90 U/L — AB (ref 17–63)
AST: 173 U/L — ABNORMAL HIGH (ref 15–41)
Albumin: 3.2 g/dL — ABNORMAL LOW (ref 3.5–5.0)
BILIRUBIN DIRECT: 0.8 mg/dL — AB (ref 0.1–0.5)
BILIRUBIN INDIRECT: 0.5 mg/dL (ref 0.3–0.9)
BILIRUBIN TOTAL: 1.3 mg/dL — AB (ref 0.3–1.2)
TOTAL PROTEIN: 6.7 g/dL (ref 6.5–8.1)

## 2016-11-03 LAB — PROTIME-INR
INR: 5.06 — AB
PROTHROMBIN TIME: 48.3 s — AB (ref 11.4–15.2)

## 2016-11-03 LAB — LIPASE, BLOOD: Lipase: 16 U/L (ref 11–51)

## 2016-11-03 LAB — TYPE AND SCREEN
ABO/RH(D): A NEG
Antibody Screen: NEGATIVE

## 2016-11-03 LAB — MAGNESIUM: MAGNESIUM: 2.3 mg/dL (ref 1.7–2.4)

## 2016-11-03 LAB — TROPONIN I: Troponin I: 0.28 ng/mL (ref <0.03)

## 2016-11-03 LAB — PHOSPHORUS: Phosphorus: 6.1 mg/dL — ABNORMAL HIGH (ref 2.5–4.6)

## 2016-11-03 LAB — TSH: TSH: 2.991 u[IU]/mL (ref 0.350–4.500)

## 2016-11-03 LAB — ABO/RH: ABO/RH(D): A NEG

## 2016-11-03 MED ORDER — TRAMADOL HCL 50 MG PO TABS
50.0000 mg | ORAL_TABLET | Freq: Once | ORAL | Status: AC
  Administered 2016-11-03: 50 mg via ORAL
  Filled 2016-11-03: qty 1

## 2016-11-03 MED ORDER — SODIUM CHLORIDE 0.9% FLUSH
3.0000 mL | Freq: Two times a day (BID) | INTRAVENOUS | Status: DC
Start: 2016-11-03 — End: 2016-11-10
  Administered 2016-11-03 – 2016-11-09 (×13): 3 mL via INTRAVENOUS

## 2016-11-03 MED ORDER — CALCIUM GLUCONATE 10 % IV SOLN
1.0000 g | Freq: Once | INTRAVENOUS | Status: AC
  Administered 2016-11-03: 1 g via INTRAVENOUS
  Filled 2016-11-03: qty 10

## 2016-11-03 MED ORDER — SODIUM CHLORIDE 0.9 % IV SOLN
INTRAVENOUS | Status: DC
  Administered 2016-11-03: 23:00:00 via INTRAVENOUS

## 2016-11-03 MED ORDER — ALBUTEROL SULFATE (2.5 MG/3ML) 0.083% IN NEBU: 2.5000 mg | INHALATION_SOLUTION | RESPIRATORY_TRACT | Status: DC | PRN

## 2016-11-03 MED ORDER — TAMSULOSIN HCL 0.4 MG PO CAPS
0.4000 mg | ORAL_CAPSULE | Freq: Every day | ORAL | Status: DC
  Administered 2016-11-04 – 2016-11-10 (×7): 0.4 mg via ORAL
  Filled 2016-11-03 (×7): qty 1

## 2016-11-03 MED ORDER — ALLOPURINOL 300 MG PO TABS
300.0000 mg | ORAL_TABLET | Freq: Every day | ORAL | Status: DC
  Administered 2016-11-04: 300 mg via ORAL
  Filled 2016-11-03: qty 1

## 2016-11-03 MED ORDER — SODIUM CHLORIDE 0.9 % IV SOLN
INTRAVENOUS | Status: DC
  Administered 2016-11-03: 22:00:00 via INTRAVENOUS

## 2016-11-03 MED ORDER — PREDNISONE 20 MG PO TABS
20.0000 mg | ORAL_TABLET | Freq: Two times a day (BID) | ORAL | Status: DC
  Administered 2016-11-03: 20 mg via ORAL
  Filled 2016-11-03: qty 1

## 2016-11-03 MED ORDER — ESCITALOPRAM OXALATE 10 MG PO TABS
20.0000 mg | ORAL_TABLET | Freq: Every day | ORAL | Status: DC
  Filled 2016-11-03: qty 2

## 2016-11-03 MED ORDER — SULFAMETHOXAZOLE-TRIMETHOPRIM 800-160 MG PO TABS
1.0000 | ORAL_TABLET | Freq: Two times a day (BID) | ORAL | Status: DC
  Administered 2016-11-03: 1 via ORAL
  Filled 2016-11-03: qty 1

## 2016-11-03 MED ORDER — ONDANSETRON HCL 4 MG/2ML IJ SOLN
4.0000 mg | Freq: Once | INTRAMUSCULAR | Status: AC
  Administered 2016-11-03: 4 mg via INTRAVENOUS
  Filled 2016-11-03: qty 2

## 2016-11-03 MED ORDER — ROPINIROLE HCL 1 MG PO TABS
3.0000 mg | ORAL_TABLET | Freq: Every day | ORAL | Status: DC
  Administered 2016-11-03: 3 mg via ORAL
  Filled 2016-11-03 (×2): qty 3

## 2016-11-03 MED ORDER — SODIUM CHLORIDE 0.9 % IV SOLN
1.0000 g | Freq: Once | INTRAVENOUS | Status: AC
  Administered 2016-11-03: 1 g via INTRAVENOUS
  Filled 2016-11-03: qty 10

## 2016-11-03 MED ORDER — FENOFIBRATE 160 MG PO TABS
160.0000 mg | ORAL_TABLET | Freq: Every day | ORAL | Status: DC
  Administered 2016-11-04: 160 mg via ORAL
  Filled 2016-11-03: qty 1

## 2016-11-03 MED ORDER — SERTRALINE HCL 50 MG PO TABS: 50.0000 mg | ORAL_TABLET | Freq: Every day | ORAL | Status: DC

## 2016-11-03 MED ORDER — DUTASTERIDE 0.5 MG PO CAPS
0.5000 mg | ORAL_CAPSULE | Freq: Every day | ORAL | Status: DC
  Administered 2016-11-04 – 2016-11-10 (×7): 0.5 mg via ORAL
  Filled 2016-11-03 (×7): qty 1

## 2016-11-03 MED ORDER — OXYCODONE-ACETAMINOPHEN 5-325 MG PO TABS
1.0000 | ORAL_TABLET | Freq: Four times a day (QID) | ORAL | Status: DC | PRN
  Administered 2016-11-03 – 2016-11-05 (×4): 2 via ORAL
  Filled 2016-11-03 (×4): qty 2

## 2016-11-03 MED ORDER — LEVOFLOXACIN 500 MG PO TABS: 500.0000 mg | ORAL_TABLET | Freq: Every day | ORAL | Status: DC

## 2016-11-03 NOTE — ED Notes (Signed)
Phlebotomy at the bedside  

## 2016-11-03 NOTE — ED Notes (Signed)
MD Zackowski at the bedside

## 2016-11-03 NOTE — ED Notes (Signed)
I-stat troponin result given to Dr.Zammit

## 2016-11-03 NOTE — ED Notes (Signed)
Dr. Roderic Palau informed of pts troponin of 0.11. No new verbal orders at this time.

## 2016-11-03 NOTE — ED Notes (Signed)
Nurse 1st notified on lab results

## 2016-11-03 NOTE — ED Provider Notes (Addendum)
[facility] DEPT Provider Note   CSN: [id number] Arrival date & time: 11/03/16  1304     History   Chief Complaint Chief Complaint  Patient presents with  . Shortness of Breath  . Abnormal Lab    HPI [name] is a 83 y.o. male.  Patient referred in from primary care office. Patient went there to be seen for exertional shortness of breath over the past few days. Patient had significant lab abnormalities include an INR of 5 in a significant anemia to have a hemoglobin of 7. As well as renal functions was consistent with acute renal failure. Potassium elevated as well. Patient without any significant complaints no shortness of breath while sitting in the bed. Patient does have a history of some scalp abscesses that have been lanced and grew staph aureus and then recently found that was MRSA. Currently on Keflex antibiotic. Patient also with a varicose vein abnormalities to his right leg and that is the wrapped with an Ace wrap.  Patient does have a long-standing bout pacemaker for atrial fibrillation. And is on Coumadin.      Past Medical History:  Diagnosis Date  . Coronary artery disease   . Diverticulitis   . Gout   . Hyperlipidemia   . MI (myocardial infarction)   . Nonischemic cardiomyopathy (HCC)    mild  . OSA on CPAP   . Permanent atrial fibrillation ([place])   . Pulmonary hypertension     Patient Active Problem List   Diagnosis Date Noted  . ARF (acute renal failure) ([place]) 11/03/2016  . Chronic diastolic heart failure ([place]) 09/09/2015  . Poor short term memory 11/05/2013  . Fatigue 10/13/2011  . Dyspnea on exertion 10/13/2011  . Atrial fibrillation, persistent, slow VR 10/13/2011  . Pacemaker single chamber, [place], 2013 10/13/2011  . Restless leg syndrome 10/13/2011  . Chronic anticoagulation 10/13/2011  . Nonischemic cardiomyopathy - coronaries by angiography 2010 10/13/2011  . Sleep apnea, on C-Pap 10/13/2011  . Tremor,  hereditary, benign 10/13/2011  . Depression 10/13/2011  . Pulmonary hypertension 10/13/2011  . MRSA (methicillin resistant Staphylococcus aureus) carrier 10/13/2011  . BPH (benign prostatic hyperplasia) 10/13/2011    Past Surgical History:  Procedure Laterality Date  . CARDIAC CATHETERIZATION  11/07/2008   nonischemic cardiomyopathy,pulmonary hypertension  . COLOSTOMY    . COLOSTOMY REVERSAL    . CORONARY ANGIOPLASTY  06/01/1999   successful to ostium of the first diagonal  . ESOPHAGOGASTRODUODENOSCOPY N/A 08/22/2013   Procedure: ESOPHAGOGASTRODUODENOSCOPY (EGD);  Surgeon: [name], MD;  Location: [facility] ENDOSCOPY;  Service: Endoscopy;  Laterality: N/A;  h/p in file cabinet, jackie  . ESOPHAGOGASTRODUODENOSCOPY N/A 11/05/2014   Procedure: ESOPHAGOGASTRODUODENOSCOPY (EGD);  Surgeon: Clarene Essex, MD;  Location: [facility] ENDOSCOPY;  Service: Endoscopy;  Laterality: N/A;  . HOT HEMOSTASIS N/A 11/05/2014   Procedure: HOT HEMOSTASIS (ARGON PLASMA COAGULATION/BICAP);  Surgeon: Clarene Essex, MD;  Location: [facility] ENDOSCOPY;  Service: Endoscopy;  Laterality: N/A;  . NM MYOCAR PERF WALL MOTION  11/24/2007   normal  . PERMANENT PACEMAKER INSERTION  10/04/2012   [employer]  . PERMANENT PACEMAKER INSERTION N/A 10/12/2011   Procedure: PERMANENT PACEMAKER INSERTION;  Surgeon: [name], MD;  Location: [facility] CATH LAB;  Service: Cardiovascular;  Laterality: N/A;  . REPLACEMENT TOTAL KNEE    . SAVORY DILATION N/A 08/22/2013   Procedure: SAVORY DILATION;  Surgeon: [name], MD;  Location: [facility] ENDOSCOPY;  Service: Endoscopy;  Laterality: N/A;  . SHOULDER SURGERY    . US  ECHOCARDIOGRAPHY  02/01/2011   LA is mod-severely dilated,AOV & root sclerotic,ca+ AOV leaflets       Home Medications    Prior to Admission medications   Medication Sig Start Date End Date Taking? Authorizing Provider  allopurinol (ZYLOPRIM) 300 MG tablet Take 300 mg by mouth daily. 03/01/13   Historical Provider, MD  COLCRYS 0.6 MG tablet  Take 0.6 mg by mouth as needed. 03/01/13   Historical Provider, MD  Docusate Calcium (STOOL SOFTENER PO) Take 3 tablets by mouth daily.    Historical Provider, MD  dutasteride (AVODART) 0.5 MG capsule Take 0.5 mg by mouth daily.    Historical Provider, MD  escitalopram (LEXAPRO) 20 MG tablet Take 20 mg by mouth daily.    Historical Provider, MD  fenofibrate 160 MG tablet Take 160 mg by mouth daily.    Historical Provider, MD  furosemide (LASIX) 20 MG tablet Take 20 mg by mouth daily. swelling    Historical Provider, MD  levofloxacin (LEVAQUIN) 500 MG tablet Take 1 tablet (500 mg total) by mouth daily. 07/20/16   Edrick Kins, DPM  losartan (COZAAR) 100 MG tablet Take 100 mg by mouth daily.    Historical Provider, MD  Multiple Vitamin (MULTIVITAMIN WITH MINERALS) TABS tablet Take 1 tablet by mouth daily.    Historical Provider, MD  nitroGLYCERIN (NITROSTAT) 0.4 MG SL tablet Place 0.4 mg under the tongue every 5 (five) minutes as needed. Chest pain    Historical Provider, MD  oxyCODONE-acetaminophen (PERCOCET) 5-325 MG tablet Take 1-2 tablets by mouth every 6 (six) hours as needed. 08/21/16   Veryl Speak, MD  potassium chloride SA (K-DUR,KLOR-CON) 20 MEQ tablet Take 20 mEq by mouth daily.     Historical Provider, MD  predniSONE (DELTASONE) 10 MG tablet Take 2 tablets (20 mg total) by mouth 2 (two) times daily. 08/21/16   Veryl Speak, MD  rOPINIRole (REQUIP) 3 MG tablet Take 1 tablet by mouth daily. Take 1 tab daily 08/02/15   Historical Provider, MD  sertraline (ZOLOFT) 50 MG tablet Take 50 mg by mouth daily.    Historical Provider, MD  sulfamethoxazole-trimethoprim (BACTRIM DS,SEPTRA DS) 800-160 MG tablet Take 1 tablet by mouth 2 (two) times daily. 06/21/16   Edrick Kins, DPM  Tamsulosin HCl (FLOMAX) 0.4 MG CAPS Take 0.4 mg by mouth daily after breakfast.    Historical Provider, MD  traMADol (ULTRAM) 50 MG tablet Take 50 mg by mouth every 6 (six) hours as needed. For pain    Historical Provider, MD   warfarin (COUMADIN) 5 MG tablet Take 1 tablet by mouth daily. Take 1 tab daily except Tuesday take half tab 08/13/15   Historical Provider, MD    Family History Family History  Problem Relation Age of Onset  . Cancer Mother   . Heart attack Father   . Diabetes Brother     Social History Social History  Substance Use Topics  . Smoking status: Never Smoker  . Smokeless tobacco: Never Used  . Alcohol use No     Allergies   Vicodin [hydrocodone-acetaminophen] and Hydrocodone   Review of Systems Review of Systems  Constitutional: Negative for fever.  HENT: Negative for congestion.   Eyes: Positive for visual disturbance.  Respiratory: Positive for shortness of breath.   Cardiovascular: Negative for chest pain.  Gastrointestinal: Negative for abdominal pain.  Genitourinary: Positive for decreased urine volume.  Skin: Positive for wound.  Neurological: Negative for headaches.  Hematological: Bruises/bleeds easily.  Psychiatric/Behavioral: Negative for confusion.  Physical Exam Updated Vital Signs BP 123/61   Pulse (!) 50   Temp 98.1 F (36.7 C) (Oral)   Resp 16   Ht 5\' 7"  (1.702 m)   Wt 112.9 kg   SpO2 100%   BMI 38.97 kg/m   Physical Exam  Constitutional: He is oriented to person, place, and time. He appears well-developed and well-nourished. No distress.  HENT:  Head: Normocephalic and atraumatic.  Mouth/Throat: Oropharynx is clear and moist.  Healing wound scabs to the occiput. No erythema no purulent discharge.  Eyes: Conjunctivae and EOM are normal. Pupils are equal, round, and reactive to light.  Neck: Normal range of motion. Neck supple.  Cardiovascular: Normal rate, regular rhythm and normal heart sounds.   Pulmonary/Chest: Effort normal and breath sounds normal.  Abdominal: Soft. Bowel sounds are normal. There is no tenderness.  Musculoskeletal: Normal range of motion.  Right leg wrapped with Ace wrap.  Neurological: He is alert and oriented to  person, place, and time. No cranial nerve deficit or sensory deficit. He exhibits normal muscle tone. Coordination normal.  Skin: Skin is warm.  Nursing note and vitals reviewed.    ED Treatments / Results  Labs (all labs ordered are listed, but only abnormal results are displayed) Labs Reviewed  BASIC METABOLIC PANEL - Abnormal; Notable for the following:       Result Value   Potassium 6.0 (*)    BUN 84 (*)    Creatinine, Ser 3.35 (*)    GFR calc non Af Amer 16 (*)    GFR calc Af Amer 18 (*)    All other components within normal limits  CBC - Abnormal; Notable for the following:    RBC 2.30 (*)    Hemoglobin 7.6 (*)    HCT 23.6 (*)    MCV 102.6 (*)    RDW 16.7 (*)    All other components within normal limits  PROTIME-INR - Abnormal; Notable for the following:    Prothrombin Time 48.3 (*)    INR 5.06 (*)    All other components within normal limits  BASIC METABOLIC PANEL - Abnormal; Notable for the following:    Potassium 6.3 (*)    CO2 21 (*)    BUN 85 (*)    Creatinine, Ser 3.36 (*)    GFR calc non Af Amer 16 (*)    GFR calc Af Amer 18 (*)    All other components within normal limits  HEPATIC FUNCTION PANEL - Abnormal; Notable for the following:    Albumin 3.2 (*)    AST 173 (*)    ALT 90 (*)    Total Bilirubin 1.3 (*)    Bilirubin, Direct 0.8 (*)    All other components within normal limits  BASIC METABOLIC PANEL - Abnormal; Notable for the following:    Potassium 5.7 (*)    BUN 84 (*)    Creatinine, Ser 3.30 (*)    GFR calc non Af Amer 16 (*)    GFR calc Af Amer 18 (*)    All other components within normal limits  I-STAT TROPOININ, ED - Abnormal; Notable for the following:    Troponin i, poc 0.11 (*)    All other components within normal limits  I-STAT TROPOININ, ED - Abnormal; Notable for the following:    Troponin i, poc 0.13 (*)    All other components within normal limits  LIPASE, BLOOD  TYPE AND SCREEN  ABO/RH    EKG  EKG  Interpretation  Date/Time:  Wednesday November 03 2016 13:09:28 EDT Ventricular Rate:  60 PR Interval:  156 QRS Duration: 138 QT Interval:  428 QTC Calculation: 428 R Axis:   -76 Text Interpretation:  Normal sinus rhythm Left axis deviation Non-specific intra-ventricular conduction block Inferior infarct , age undetermined Anterolateral infarct , age undetermined Abnormal ECG ? paced Confirmed by Rogene Houston  MD, [place] [phone]) on 11/03/2016 3:22:06 PM       Radiology Dg Chest Port 1 View  Result Date: 11/03/2016 CLINICAL DATA:  Shortness of Breath EXAM: PORTABLE CHEST 1 VIEW COMPARISON:  08/19/2014 FINDINGS: Cardiomegaly is noted. Central mild vascular congestion without convincing pulmonary edema. Elevation of the right hemidiaphragm again noted. Bilateral basilar atelectasis. Single lead cardiac pacemaker is unchanged in position. No segmental infiltrate. IMPRESSION: Cardiomegaly. Central mild vascular congestion without convincing pulmonary edema. Mild basilar atelectasis. No segmental infiltrate. Electronically Signed   By: Lahoma Crocker M.D.   On: 11/03/2016 16:14    Procedures Procedures (including critical care time)  CRITICAL CARE Performed by: Fredia Sorrow Total critical care time: 30 minutes Critical care time was exclusive of separately billable procedures and treating other patients. Critical care was necessary to treat or prevent imminent or life-threatening deterioration. Critical care was time spent personally by me on the following activities: development of treatment plan with patient and/or surrogate as well as nursing, discussions with consultants, evaluation of patient's response to treatment, examination of patient, obtaining history from patient or surrogate, ordering and performing treatments and interventions, ordering and review of laboratory studies, ordering and review of radiographic studies, pulse oximetry and re-evaluation of patient's condition.   Medications  Ordered in ED Medications  0.9 %  sodium chloride infusion (not administered)  calcium gluconate 1 g in sodium chloride 0.9 % 100 mL IVPB (0 g Intravenous Stopped 11/03/16 1644)  traMADol (ULTRAM) tablet 50 mg (50 mg Oral Given 11/03/16 1620)  ondansetron (ZOFRAN) injection 4 mg (4 mg Intravenous Given 11/03/16 1621)     Initial Impression / Assessment and Plan / ED Course  I have reviewed the triage vital signs and the nursing notes.  Pertinent labs & imaging results that were available during my care of the patient were reviewed by me and considered in my medical decision making (see chart for details).     Patient sent in from primary care office. [facility] For abnormal lab work. Patient was seen by them this morning. Patient's had the complaint of exertional shortness of breath for a few days. Denies any chest pain. Patient also with the scalp abscesses which have grown staph aureus recently determined to be MRSA.  Patient has been on Keflex for this but there was a plan to perhaps change the antibiotic.  Patient had significant lab abnormalities so he was sent inappropriately for ED evaluation.  Patient's potassium was noted to be elevated. Troponins elevated INR significant elevated at 5. And patient appears to be an acute renal failure. Patient also with significant anemia with a hemoglobin in the 7 range.   outpatient typed in the screen for blood transfusion however will hold off actual transfusion due to the potassium being in the 6 range. Patient's EKG shows a wide complex rhythm but he is paced. Is unchanged from his old ones. Patient given calcium gluconate 1 g to protect against the hyperkalemia.  Discussed with the hospitalist. We will get the renal situation under control and the potassium under control prior to transfusion. Patient laying down is in  no distress due to the anemia. No chest pain no hypoxia no distress.  Chest x-ray negative  other than the enlarged heart. Perhaps some mild fluid.  Patient will be admitted to the stepdown unit.   Final Clinical Impressions(s) / ED Diagnoses   Final diagnoses:  Acute hyperkalemia  Acute renal failure, unspecified acute renal failure type (HCC)  Anemia, unspecified type  Secondary hypercoagulable state [facility])    New Prescriptions New Prescriptions   No medications on file     Fredia Sorrow, MD 11/03/16 [place], MD 11/03/16 Bosie Helper

## 2016-11-03 NOTE — ED Notes (Addendum)
Portable Xray at the bedside. MD Zackowski made aware of the patient's potassium

## 2016-11-03 NOTE — H&P (Addendum)
History and Physical    [name] ERD:[id number] DOB: 1932-06-14 DOA: 11/03/2016  Referring MD/NP/PA: Dr. Rogene Houston  PCP: [name], MD   Patient coming from: home   Chief Complaint: generalized weakness   HPI: [name] is a 83 y.o. male with a-fib on coumadin, MI, non ischemic cardiomyopathy, s/p pacemaker, HTN, presented earlier to PCP office for dyspnea and was referred to ED due to multiple blood work abnormalities. Pt reports ongoing dyspnea for several days, poor oral intake and fatigue. No active chest pain, no urinary concerns. Pt explains he has had some "heaviness in the stomach" and felt full and was not able to eat. He does have varicose veins RLE and has ACE wrap but says earlier in the day he had lots of bleeding from the varicose veins, 6 episodes of significant blood loss, unable to quantify. Pt reports having recent scalp abscess that was lanced and tested + for staph, he has been on antibiotic for it.   ED Course: In ED, pt noted to be hemodynamically stable, VS notable for HR in 50-60's, initial oxygen saturation 88% on RA and pt placed on oxygen via [place], O2 sat up to 100%. Blood work notable for Hg 7.6, K 6 --> 6.3, Cr 3.35 --> 3.36. INR 5. Pt give dose of calcium gluconate and TRH asked to admit to step down unit due to severe electrolyte derangements.   Review of Systems:  Constitutional: Negative for fever, chills HENT: Negative for ear pain, nosebleeds, congestion, facial swelling, rhinorrhea, neck pain, neck stiffness and ear discharge.   Eyes: Negative for pain, discharge, redness, itching and visual disturbance.  Respiratory: Negative for wheezing and stridor.   Cardiovascular: Negative for palpitations and leg swelling.  Gastrointestinal: Negative for abdominal distention.  Genitourinary: Negative for dysuria, urgency, frequency, hematuria Musculoskeletal: Negative for back pain, joint swelling, arthralgias and gait problem.  Neurological:  Negative for dizziness, tremors, seizures, syncope, facial asymmetry, speech difficulty, weakness, light-headedness, numbness and headaches.  Hematological: Negative for adenopathy. Does not bruise/bleed easily.  Psychiatric/Behavioral: Negative for hallucinations, behavioral problems, confusion, dysphoric mood, decreased concentration and agitation.   Past Medical History:  Diagnosis Date  . Coronary artery disease   . Diverticulitis   . Gout   . Hyperlipidemia   . MI (myocardial infarction)   . Nonischemic cardiomyopathy (HCC)    mild  . OSA on CPAP   . Permanent atrial fibrillation ([place])   . Pulmonary hypertension     Past Surgical History:  Procedure Laterality Date  . CARDIAC CATHETERIZATION  11/07/2008   nonischemic cardiomyopathy,pulmonary hypertension  . COLOSTOMY    . COLOSTOMY REVERSAL    . CORONARY ANGIOPLASTY  06/01/1999   successful to ostium of the first diagonal  . ESOPHAGOGASTRODUODENOSCOPY N/A 08/22/2013   Procedure: ESOPHAGOGASTRODUODENOSCOPY (EGD);  Surgeon: [name], MD;  Location: [facility] ENDOSCOPY;  Service: Endoscopy;  Laterality: N/A;  h/p in file cabinet, jackie  . ESOPHAGOGASTRODUODENOSCOPY N/A 11/05/2014   Procedure: ESOPHAGOGASTRODUODENOSCOPY (EGD);  Surgeon: Clarene Essex, MD;  Location: [facility] ENDOSCOPY;  Service: Endoscopy;  Laterality: N/A;  . HOT HEMOSTASIS N/A 11/05/2014   Procedure: HOT HEMOSTASIS (ARGON PLASMA COAGULATION/BICAP);  Surgeon: Clarene Essex, MD;  Location: [facility] ENDOSCOPY;  Service: Endoscopy;  Laterality: N/A;  . NM MYOCAR PERF WALL MOTION  11/24/2007   normal  . PERMANENT PACEMAKER INSERTION  10/04/2012   [employer]  . PERMANENT PACEMAKER INSERTION N/A 10/12/2011   Procedure: PERMANENT PACEMAKER INSERTION;  Surgeon: [name], MD;  Location:  [place] [facility] CATH LAB;  Service: Cardiovascular;  Laterality: N/A;  . REPLACEMENT TOTAL KNEE    . SAVORY DILATION N/A 08/22/2013   Procedure: SAVORY DILATION;  Surgeon: [name], MD;  Location: [facility]  ENDOSCOPY;  Service: Endoscopy;  Laterality: N/A;  . SHOULDER SURGERY    . US ECHOCARDIOGRAPHY  02/01/2011   LA is mod-severely dilated,AOV & root sclerotic,ca+ AOV leaflets   Social Hx:  reports that he has never smoked. He has never used smokeless tobacco. He reports that he does not drink alcohol or use drugs.  Allergies  Allergen Reactions  . Vicodin [Hydrocodone-Acetaminophen] Nausea And Vomiting  . Hydrocodone Nausea Only    Family History  Problem Relation Age of Onset  . Cancer Mother   . Heart attack Father   . Diabetes Brother     Prior to Admission medications   Medication Sig Start Date End Date Taking? Authorizing Provider  allopurinol (ZYLOPRIM) 300 MG tablet Take 300 mg by mouth daily. 03/01/13   Historical Provider, MD  COLCRYS 0.6 MG tablet Take 0.6 mg by mouth as needed. 03/01/13   Historical Provider, MD  Docusate Calcium (STOOL SOFTENER PO) Take 3 tablets by mouth daily.    Historical Provider, MD  dutasteride (AVODART) 0.5 MG capsule Take 0.5 mg by mouth daily.    Historical Provider, MD  escitalopram (LEXAPRO) 20 MG tablet Take 20 mg by mouth daily.    Historical Provider, MD  fenofibrate 160 MG tablet Take 160 mg by mouth daily.    Historical Provider, MD  furosemide (LASIX) 20 MG tablet Take 20 mg by mouth daily. swelling    Historical Provider, MD  levofloxacin (LEVAQUIN) 500 MG tablet Take 1 tablet (500 mg total) by mouth daily. 07/20/16   Edrick Kins, DPM  losartan (COZAAR) 100 MG tablet Take 100 mg by mouth daily.    Historical Provider, MD  Multiple Vitamin (MULTIVITAMIN WITH MINERALS) TABS tablet Take 1 tablet by mouth daily.    Historical Provider, MD  nitroGLYCERIN (NITROSTAT) 0.4 MG SL tablet Place 0.4 mg under the tongue every 5 (five) minutes as needed. Chest pain    Historical Provider, MD  oxyCODONE-acetaminophen (PERCOCET) 5-325 MG tablet Take 1-2 tablets by mouth every 6 (six) hours as needed. 08/21/16   Veryl Speak, MD  potassium chloride SA  (K-DUR,KLOR-CON) 20 MEQ tablet Take 20 mEq by mouth daily.     Historical Provider, MD  predniSONE (DELTASONE) 10 MG tablet Take 2 tablets (20 mg total) by mouth 2 (two) times daily. 08/21/16   Veryl Speak, MD  rOPINIRole (REQUIP) 3 MG tablet Take 1 tablet by mouth daily. Take 1 tab daily 08/02/15   Historical Provider, MD  sertraline (ZOLOFT) 50 MG tablet Take 50 mg by mouth daily.    Historical Provider, MD  sulfamethoxazole-trimethoprim (BACTRIM DS,SEPTRA DS) 800-160 MG tablet Take 1 tablet by mouth 2 (two) times daily. 06/21/16   Edrick Kins, DPM  Tamsulosin HCl (FLOMAX) 0.4 MG CAPS Take 0.4 mg by mouth daily after breakfast.    Historical Provider, MD  traMADol (ULTRAM) 50 MG tablet Take 50 mg by mouth every 6 (six) hours as needed. For pain    Historical Provider, MD  warfarin (COUMADIN) 5 MG tablet Take 1 tablet by mouth daily. Take 1 tab daily except Tuesday take half tab 08/13/15   Historical Provider, MD   Physical Exam: Vitals:   11/03/16 1600 11/03/16 1630 11/03/16 1700 11/03/16 1730  BP: 109/60 (!) 121/52 (!) 119/57 123/61  [name]: 60 (!) 59 (!) 58 (!) 50  Resp: (!) 22 19 15 16   Temp:      TempSrc:      SpO2: 98% (!) 88% 100% 100%  Weight:      Height:        Constitutional: NAD Vitals:   11/03/16 1600 11/03/16 1630 11/03/16 1700 11/03/16 1730  BP: 109/60 (!) 121/52 (!) 119/57 123/61  [name]: 60 (!) 59 (!) 58 (!) 50  Resp: (!) 22 19 15 16   Temp:      TempSrc:      SpO2: 98% (!) 88% 100% 100%  Weight:      Height:       Eyes: PERRL, lids and conjunctivae normal ENMT: Mucous membranes are dry. Posterior pharynx clear of any exudate or lesions.Normal dentition.  Neck: normal, supple, no masses, no thyromegaly Respiratory:  Normal respiratory effort. No accessory muscle use. Diminished breath sounds at bases  Cardiovascular: Paced rhythm, no murmurs / rubs / gallops. No extremity edema. No carotid bruits.  Abdomen: no tenderness, no masses palpated. No  hepatosplenomegaly. Bowel sounds positive.  Musculoskeletal: no clubbing / cyanosis. No joint deformity upper and lower extremities. G Skin: no rashes. No induration Neurologic: CN 2-12 grossly intact. Sensation intact, DTR normal. Strength 5/5 in all 4.  Psychiatric: Normal judgment and insight. Alert and oriented x 3. Normal mood.   Labs on Admission: I have personally reviewed following labs and imaging studies  CBC:  Recent Labs Lab 11/03/16 1327  WBC 4.4  HGB 7.6*  HCT 23.6*  MCV 102.6*  PLT 621   Basic Metabolic Panel:  Recent Labs Lab 11/03/16 1327 11/03/16 1520 11/03/16 1727  NA 136 135 136  K 6.0* 6.3* 5.7*  CL 104 103 103  CO2 22 21* 23  GLUCOSE 86 87 81  BUN 84* 85* 84*  CREATININE 3.35* 3.36* 3.30*  CALCIUM 9.0 9.1 9.2   Liver Function Tests:  Recent Labs Lab 11/03/16 1640  AST 173*  ALT 90*  ALKPHOS 51  BILITOT 1.3*  PROT 6.7  ALBUMIN 3.2*    Recent Labs Lab 11/03/16 1640  LIPASE 16   Coagulation Profile:  Recent Labs Lab 11/03/16 1327  INR 5.06*   Radiological Exams on Admission: Dg Chest Port 1 View  Result Date: 11/03/2016 CLINICAL DATA:  Shortness of Breath EXAM: PORTABLE CHEST 1 VIEW COMPARISON:  08/19/2014 FINDINGS: Cardiomegaly is noted. Central mild vascular congestion without convincing pulmonary edema. Elevation of the right hemidiaphragm again noted. Bilateral basilar atelectasis. Single lead cardiac pacemaker is unchanged in position. No segmental infiltrate. IMPRESSION: Cardiomegaly. Central mild vascular congestion without convincing pulmonary edema. Mild basilar atelectasis. No segmental infiltrate. Electronically Signed   By: Lahoma Crocker M.D.   On: 11/03/2016 16:14   EKG: paced rhythm   Assessment/Plan Principal Problem:   ARF (acute renal failure) ([place]) - imposed on CKD stage III, GFR in 50's with stable Cr in January 2016, no other recent labs in EPIC - likely pre renal in etiology in the setting of bleeding, lasix  use, losartan use  - hold lasix  - provide IVF - repeat BMP in AM  Active Problems:   Hyperkalemia - gave one dose of Ca gluconate - K is trending down - stop K supplement that pt takes at home    Dyspnea - suspect from acute blood loss anemia - hold off on transfusion for now due to suspected pulmonary vascular congestion  - keep on oxygen - transfuse for Hg <  7.5    Acute blood loss anemia - in pt on coumadin, had some bleeding from varicose veins, RLE but not sure if that is the only source  - hold coumadin  - CBC in AM - FOBT pending     Atrial fibrillation, persistent, slow VR   Pacemaker single chamber, [employer], 2013   Chronic anticoagulation - INR supra therapeutic - hold coumadin  - place on SCD's for DVT prophylaxis  - keep on tele  - correct K - check Mg and phosph level     BPH (benign prostatic hyperplasia) - resume home medical regimen     Chronic diastolic heart failure ([place]) - last ECHO in 2016 with stable ED - paced rhythm - monitor daily weights, strict I/O - hold lasix for now until renal function stabilizes     Depression - appears to be stable at this time - continue home medical regimen    DVT prophylaxis: SCD's Code Status: Full  Family Communication: Pt, wife, daughter updated at bedside Disposition Plan: home once medically stable Consults called: None Admission status: Inpatient   Faye Ramsay MD [facility] Pager [phone]  If 7PM-7AM, please contact night-coverage [email or web address] Password [facility]  11/03/2016, 6:38 PM

## 2016-11-03 NOTE — ED Triage Notes (Signed)
Pt reports having sob for extended amount of time but recently more severe. Also sent here for abnormal labs, INR 5.9 and Hgb 7.4

## 2016-11-03 NOTE — ED Notes (Signed)
Pt refusing to have another chest xray at this time. He states he just had one before he arrived here "and they told me it was fine." Pt informed of the importance of getting one here due to that chest xray results not crossing over from his doctors office to [facility]. Pt still refusing.

## 2016-11-03 NOTE — ED Notes (Signed)
CRITICAL VALUE ALERT  Critical value received:  INR 5.06  Date of notification:  01/03/2017  Time of notification:  14:22  Critical value read back: yes  Nurse who received alert:  Earleen Newport  PA Irena Cords made aware

## 2016-11-03 NOTE — ED Notes (Signed)
[phone] [name] pt daughter would like to be called w/ updates.

## 2016-11-04 ENCOUNTER — Inpatient Hospital Stay (HOSPITAL_COMMUNITY): Payer: Medicare Other

## 2016-11-04 ENCOUNTER — Ambulatory Visit: Payer: Medicare Other | Admitting: Cardiology

## 2016-11-04 DIAGNOSIS — I5032 Chronic diastolic (congestive) heart failure: Secondary | ICD-10-CM

## 2016-11-04 DIAGNOSIS — D649 Anemia, unspecified: Secondary | ICD-10-CM

## 2016-11-04 DIAGNOSIS — D6832 Hemorrhagic disorder due to extrinsic circulating anticoagulants: Secondary | ICD-10-CM

## 2016-11-04 DIAGNOSIS — Z950 Presence of cardiac pacemaker: Secondary | ICD-10-CM

## 2016-11-04 DIAGNOSIS — N179 Acute kidney failure, unspecified: Secondary | ICD-10-CM

## 2016-11-04 DIAGNOSIS — Z7901 Long term (current) use of anticoagulants: Secondary | ICD-10-CM

## 2016-11-04 DIAGNOSIS — E875 Hyperkalemia: Secondary | ICD-10-CM

## 2016-11-04 DIAGNOSIS — N183 Chronic kidney disease, stage 3 (moderate): Secondary | ICD-10-CM

## 2016-11-04 DIAGNOSIS — I481 Persistent atrial fibrillation: Secondary | ICD-10-CM

## 2016-11-04 LAB — BASIC METABOLIC PANEL
Anion gap: 12 (ref 5–15)
Anion gap: 12 (ref 5–15)
BUN: 88 mg/dL — ABNORMAL HIGH (ref 6–20)
BUN: 89 mg/dL — AB (ref 6–20)
CO2: 19 mmol/L — ABNORMAL LOW (ref 22–32)
CO2: 19 mmol/L — AB (ref 22–32)
Calcium: 9.1 mg/dL (ref 8.9–10.3)
Calcium: 9.1 mg/dL (ref 8.9–10.3)
Chloride: 104 mmol/L (ref 101–111)
Chloride: 104 mmol/L (ref 101–111)
Creatinine, Ser: 3.28 mg/dL — ABNORMAL HIGH (ref 0.61–1.24)
Creatinine, Ser: 3.39 mg/dL — ABNORMAL HIGH (ref 0.61–1.24)
GFR calc Af Amer: 19 mL/min — ABNORMAL LOW (ref >60)
GFR calc Af Amer: 18 mL/min — ABNORMAL LOW (ref >60)
GFR calc non Af Amer: 16 mL/min — ABNORMAL LOW (ref >60)
GFR calc non Af Amer: 15 mL/min — ABNORMAL LOW (ref >60)
GLUCOSE: 122 mg/dL — AB (ref 65–99)
Glucose, Bld: 111 mg/dL — ABNORMAL HIGH (ref 65–99)
POTASSIUM: 6.2 mmol/L — AB (ref 3.5–5.1)
Potassium: 6.3 mmol/L (ref 3.5–5.1)
Sodium: 135 mmol/L (ref 135–145)
Sodium: 135 mmol/L (ref 135–145)

## 2016-11-04 LAB — PROTIME-INR
INR: 5.41
Prothrombin Time: 49.3 seconds — ABNORMAL HIGH (ref 11.4–15.2)

## 2016-11-04 LAB — IRON AND TIBC
IRON: 23 ug/dL — AB (ref 45–182)
SATURATION RATIOS: 4 % — AB (ref 17.9–39.5)
TIBC: 532 ug/dL — AB (ref 250–450)
UIBC: 509 ug/dL

## 2016-11-04 LAB — URINALYSIS, COMPLETE (UACMP) WITH MICROSCOPIC
Bacteria, UA: NONE SEEN
Bilirubin Urine: NEGATIVE
GLUCOSE, UA: NEGATIVE mg/dL
Hgb urine dipstick: NEGATIVE
Ketones, ur: NEGATIVE mg/dL
Leukocytes, UA: NEGATIVE
NITRITE: NEGATIVE
PH: 5 (ref 5.0–8.0)
Protein, ur: 30 mg/dL — AB
SPECIFIC GRAVITY, URINE: 1.016 (ref 1.005–1.030)
Squamous Epithelial / LPF: NONE SEEN

## 2016-11-04 LAB — CBC
HEMATOCRIT: 24.5 % — AB (ref 39.0–52.0)
Hemoglobin: 7.6 g/dL — ABNORMAL LOW (ref 13.0–17.0)
MCH: 31.9 pg (ref 26.0–34.0)
MCHC: 31 g/dL (ref 30.0–36.0)
MCV: 102.9 fL — ABNORMAL HIGH (ref 78.0–100.0)
Platelets: 173 10*3/uL (ref 150–400)
RBC: 2.38 MIL/uL — ABNORMAL LOW (ref 4.22–5.81)
RDW: 17.1 % — AB (ref 11.5–15.5)
WBC: 4.9 10*3/uL (ref 4.0–10.5)

## 2016-11-04 LAB — COMPREHENSIVE METABOLIC PANEL
ALK PHOS: 50 U/L (ref 38–126)
ALT: 101 U/L — AB (ref 17–63)
AST: 198 U/L — ABNORMAL HIGH (ref 15–41)
Albumin: 3 g/dL — ABNORMAL LOW (ref 3.5–5.0)
Anion gap: 8 (ref 5–15)
BILIRUBIN TOTAL: 1 mg/dL (ref 0.3–1.2)
BUN: 87 mg/dL — AB (ref 6–20)
CO2: 23 mmol/L (ref 22–32)
CREATININE: 3.35 mg/dL — AB (ref 0.61–1.24)
Calcium: 8.9 mg/dL (ref 8.9–10.3)
Chloride: 103 mmol/L (ref 101–111)
GFR calc Af Amer: 18 mL/min — ABNORMAL LOW (ref >60)
GFR, EST NON AFRICAN AMERICAN: 16 mL/min — AB (ref >60)
Glucose, Bld: 120 mg/dL — ABNORMAL HIGH (ref 65–99)
Potassium: 6.4 mmol/L (ref 3.5–5.1)
Sodium: 134 mmol/L — ABNORMAL LOW (ref 135–145)
TOTAL PROTEIN: 6.6 g/dL (ref 6.5–8.1)

## 2016-11-04 LAB — RETICULOCYTES
RBC.: 2.5 MIL/uL — ABNORMAL LOW (ref 4.22–5.81)
Retic Count, Absolute: 105 10*3/uL (ref 19.0–186.0)
Retic Ct Pct: 4.2 % — ABNORMAL HIGH (ref 0.4–3.1)

## 2016-11-04 LAB — TROPONIN I
TROPONIN I: 0.27 ng/mL — AB (ref <0.03)
TROPONIN I: 0.2 ng/mL — AB (ref <0.03)

## 2016-11-04 LAB — GLUCOSE, CAPILLARY
Glucose-Capillary: 140 mg/dL — ABNORMAL HIGH (ref 65–99)
Glucose-Capillary: 108 mg/dL — ABNORMAL HIGH (ref 65–99)
Glucose-Capillary: 143 mg/dL — ABNORMAL HIGH (ref 65–99)

## 2016-11-04 LAB — FERRITIN: FERRITIN: 49 ng/mL (ref 24–336)

## 2016-11-04 LAB — BRAIN NATRIURETIC PEPTIDE: B NATRIURETIC PEPTIDE 5: 1048.9 pg/mL — AB (ref 0.0–100.0)

## 2016-11-04 LAB — FOLATE: Folate: 24.8 ng/mL (ref >5.9)

## 2016-11-04 LAB — OCCULT BLOOD X 1 CARD TO LAB, STOOL: FECAL OCCULT BLD: POSITIVE — AB

## 2016-11-04 LAB — VITAMIN B12: Vitamin B-12: 2374 pg/mL — ABNORMAL HIGH (ref 180–914)

## 2016-11-04 LAB — MRSA PCR SCREENING: MRSA by PCR: POSITIVE — AB

## 2016-11-04 MED ORDER — MUPIROCIN 2 % EX OINT
1.0000 "application " | TOPICAL_OINTMENT | Freq: Two times a day (BID) | CUTANEOUS | Status: AC
  Administered 2016-11-04 – 2016-11-08 (×10): 1 via NASAL
  Filled 2016-11-04 (×3): qty 22

## 2016-11-04 MED ORDER — ALLOPURINOL 100 MG PO TABS
200.0000 mg | ORAL_TABLET | Freq: Every day | ORAL | Status: DC
  Administered 2016-11-05 – 2016-11-10 (×6): 200 mg via ORAL
  Filled 2016-11-04 (×6): qty 2

## 2016-11-04 MED ORDER — INSULIN ASPART 100 UNIT/ML IV SOLN
10.0000 [IU] | Freq: Once | INTRAVENOUS | Status: AC
  Administered 2016-11-04: 10 [IU] via INTRAVENOUS

## 2016-11-04 MED ORDER — ORAL CARE MOUTH RINSE
15.0000 mL | Freq: Two times a day (BID) | OROMUCOSAL | Status: DC
  Administered 2016-11-04 – 2016-11-10 (×11): 15 mL via OROMUCOSAL

## 2016-11-04 MED ORDER — CEPHALEXIN 250 MG PO CAPS
250.0000 mg | ORAL_CAPSULE | Freq: Three times a day (TID) | ORAL | Status: DC
  Administered 2016-11-04 – 2016-11-07 (×11): 250 mg via ORAL
  Filled 2016-11-04 (×12): qty 1

## 2016-11-04 MED ORDER — DEXTROSE 50 % IV SOLN
1.0000 | Freq: Once | INTRAVENOUS | Status: AC
  Administered 2016-11-04: 50 mL via INTRAVENOUS
  Filled 2016-11-04: qty 50

## 2016-11-04 MED ORDER — PHYTONADIONE 5 MG PO TABS
2.5000 mg | ORAL_TABLET | Freq: Once | ORAL | Status: AC
  Administered 2016-11-04: 2.5 mg via ORAL
  Filled 2016-11-04: qty 1

## 2016-11-04 MED ORDER — FUROSEMIDE 10 MG/ML IJ SOLN
40.0000 mg | Freq: Two times a day (BID) | INTRAMUSCULAR | Status: DC
  Administered 2016-11-04 – 2016-11-05 (×2): 40 mg via INTRAVENOUS
  Filled 2016-11-04 (×2): qty 4

## 2016-11-04 MED ORDER — SODIUM POLYSTYRENE SULFONATE 15 GM/60ML PO SUSP
30.0000 g | Freq: Once | ORAL | Status: AC
  Administered 2016-11-04: 30 g via ORAL
  Filled 2016-11-04: qty 120

## 2016-11-04 MED ORDER — INSULIN ASPART 100 UNIT/ML ~~LOC~~ SOLN
0.0000 [IU] | Freq: Three times a day (TID) | SUBCUTANEOUS | Status: DC
Start: 2016-11-04 — End: 2016-11-10
  Administered 2016-11-04 – 2016-11-08 (×5): 1 [IU] via SUBCUTANEOUS

## 2016-11-04 MED ORDER — SODIUM CHLORIDE 0.9 % IV SOLN
1.0000 g | Freq: Once | INTRAVENOUS | Status: AC
  Administered 2016-11-04: 1 g via INTRAVENOUS
  Filled 2016-11-04: qty 10

## 2016-11-04 MED ORDER — CHLORHEXIDINE GLUCONATE CLOTH 2 % EX PADS
6.0000 | MEDICATED_PAD | Freq: Every day | CUTANEOUS | Status: DC
  Administered 2016-11-04 – 2016-11-07 (×4): 6 via TOPICAL

## 2016-11-04 MED ORDER — GUAIFENESIN 100 MG/5ML PO SOLN
5.0000 mL | ORAL | Status: DC | PRN
  Administered 2016-11-04 – 2016-11-08 (×3): 100 mg via ORAL
  Filled 2016-11-04 (×4): qty 5

## 2016-11-04 NOTE — Progress Notes (Signed)
K still elevated after kayexalate 30 gm dose. Give another dose now.  Faye Ramsay, MD  [facility] Pager [phone]  If 7PM-7AM, please contact night-coverage [email or web address] Password [email or web address]

## 2016-11-04 NOTE — Progress Notes (Addendum)
Patient ID: [name], male   DOB: 22-Sep-1931, 83 y.o.   MRN: [id number]    PROGRESS NOTE  ARPAN ESKELSON  RKY:[id number] DOB: March 10, 1932 DOA: 11/03/2016  PCP: [name], MD   Brief Narrative:  a 83 y.o. male with a-fib on coumadin, MI, non ischemic cardiomyopathy, s/p pacemaker, HTN, BPH (chronic urinary retention and taking Dutasteride and Tamsulosin), presented earlier to PCP office for dyspnea and was referred to ED due to multiple blood work abnormalities. Pt reports ongoing dyspnea for several days, poor oral intake and fatigue. No active chest pain, no urinary concerns. Pt explains he has had some "heaviness in the stomach" and felt full and was not able to eat. He does have varicose veins RLE and has ACE wrap but says earlier in the day he had lots of bleeding from the varicose veins, 6 episodes of significant blood loss, unable to quantify. Pt reports having recent scalp abscess that was lanced and tested + for staph, he has been on antibiotic for it.   ED Course: In ED, pt noted to be hemodynamically stable, VS notable for HR in 50-60's, initial oxygen saturation 88% on RA and pt placed on oxygen via [place], O2 sat up to 100%. Blood work notable for Hg 7.6, K 6 --> 6.3, Cr 3.35 --> 3.36. INR 5. Pt give dose of calcium gluconate and TRH asked to admit to step down unit due to severe electrolyte derangements.   Assessment & Plan:  Principal Problem:   ARF (acute renal failure) ([place]) imposed on CKD stage III - imposed on CKD stage III, GFR in 50's with stable Cr in January 2016, last report in 09/2016 Cr was 1.5  - likely multifactorial in etiology, pre renal in the setting of bleeding, lasix use, losartan use, poor oral intake  - lasix has been held as well as losartan and IVF given overnight  - Cr remains 3.3 this AM - bladder scan 12 cc - renal US requested and nephrologist consulted for assistance   Active Problems:   Hyperkalemia, hyperphosphatemia  - gave two doses of  Ca gluconate yesterday - K initially trended down, no kayexalate given last night - K up this AM 5.7 --> 5.8 --> 6.4 this AM - pt has been on K supplementation at home but this has been held since admission - kayexalate 30 gm given this AM, repeat BMP pending this AM  - stop K supplement that pt takes at home    Dyspnea - from acute blood loss anemia, component of pulmonary vascular congestion  - Hg has remained at 7.4 - 7.6 with no signs of active bleeding at this time  - CT chest pending this AM, more crackles on exam noted this AM, I have stopped IVF     Acute blood loss anemia - in pt on coumadin, had some bleeding from varicose veins, RLE but not sure if that is the only source  - coumadin was held since admission, INR pending this AM  - FOBT also requested and still pending this AM  - anemia panel also requested     Chronic diastolic heart failure ([place]) - last ECHO in 2016 with stable EF - paced rhythm - monitor daily weights, strict I/O - ECHO requested  - weight trend since admission  Filed Weights   11/03/16 1513 11/04/16 0045 11/04/16 0412  Weight: 112.9 kg (248 lb 12.8 oz) 112.8 kg (248 lb 9.6 oz) 112.8 kg (248 lb 9.6 oz)  Transaminitis  - unclear etiology, ? Congestive etiology  - monitor for now  - RUQ [place] also requested     RLE wound, varicose vein, venous stasis ulcer  - wound care consulted for assistance  - pt asked to unwrap later as he did not feel too good this AM, will come back at lunch time to assess     Atrial fibrillation, persistent, slow VR, on chronic anticoagulation    Pacemaker single chamber, [employer], 2013   Elevated troponin, demand ischemia in the setting of ARF  - INR supra therapeutic on admission and coumadin has been held - INR pending this AM  - needs correction of K, kayexalate give this AM - cardiology consulted for assistance     BPH (benign prostatic hyperplasia) - resumed home medical regimen Tamsulosin  and Dutasteride  - minimal urine output overnight - strict I/O to be monitored     Recent staph infection, scalp abscess - continue home keflex     Depression - pt appears more somnolent this AM  - may need to hold his escitalopram and ropinrole until pt more alert and awake     Obesity  - Body mass index is 38.94 kg/m.    OSA - on CPAP at night time   DVT prophylaxis: Coumadin on hold, SCD's requested  Code Status: Full  Family Communication: Patient at bedside, no family at bedside this AM  Disposition Plan: to be determined, pt not clinically stable for discharge at this time   Consultants:   Cardiology  Nephrology  WOC  Procedures:   None  Antimicrobials:   Keflex continued from home for recent scalp abscess that was positive for staph infection   Subjective: Pt reports feeling more tired this AM, No reported events overnight. Pt more somnolent but denies chest pain this AM, also denies abd pain, non productive cough noted when pt trying to change positions.   Objective: Vitals:   11/04/16 0700 11/04/16 0800 11/04/16 0803 11/04/16 0830  BP: (!) 106/52 (!) 105/56 98/64   Pulse: (!) 59 60 (!) 59 (!) 59  Resp: 17 11 12 11   Temp:   97.6 F (36.4 C)   TempSrc:   Oral   SpO2: 96% 98% 97% 93%  Weight:      Height:        Intake/Output Summary (Last 24 hours) at 11/04/16 0908 Last data filed at 11/04/16 0830  Gross per 24 hour  Intake          1142.25 ml  Output                0 ml  Net          1142.25 ml   Filed Weights   11/03/16 1513 11/04/16 0045 11/04/16 0412  Weight: 112.9 kg (248 lb 12.8 oz) 112.8 kg (248 lb 9.6 oz) 112.8 kg (248 lb 9.6 oz)   Examination:  General exam: Appears more somnolent this AM but easy to awake and able to follow commands  Respiratory system: respiratory effort is stable mild crackles noted at bases, non productive cough also noted, RR 18 this AM  Cardiovascular system: paced rhythm, no rubs, gallops or clicks. Trace  L LE edema, RLE in ace wrap Gastrointestinal system: Abdomen is nondistended, soft and nontender. Normal bowel sounds heard. Central nervous system: somnolent but easy to awake, moving all 4 extremities against gravity, sensation appears intact throughout  Extremities: Symmetric 5 x 5 power.  Skin: RLE in ACE  wrap, per pt's request, will come back at lunch time to assess the wound  Data Reviewed: I have personally reviewed following labs and imaging studies  CBC:  Recent Labs Lab 11/03/16 1327 11/03/16 2239 11/04/16 0313  WBC 4.4 4.7 4.9  HGB 7.6* 7.4* 7.6*  HCT 23.6* 23.3* 24.5*  MCV 102.6* 102.2* 102.9*  PLT 160 174 809   Basic Metabolic Panel:  Recent Labs Lab 11/03/16 1327 11/03/16 1520 11/03/16 1727 11/03/16 2239 11/04/16 0313  NA 136 135 136 136 134*  K 6.0* 6.3* 5.7* 5.8* 6.4*  CL 104 103 103 102 103  CO2 22 21* 23 23 23   GLUCOSE 86 87 81 109* 120*  BUN 84* 85* 84* 85* 87*  CREATININE 3.35* 3.36* 3.30* 3.22* 3.35*  CALCIUM 9.0 9.1 9.2 9.2 8.9  MG  --   --   --  2.3  --   PHOS  --   --   --  6.1*  --    Liver Function Tests:  Recent Labs Lab 11/03/16 1640 11/04/16 0313  AST 173* 198*  ALT 90* 101*  ALKPHOS 51 50  BILITOT 1.3* 1.0  PROT 6.7 6.6  ALBUMIN 3.2* 3.0*    Recent Labs Lab 11/03/16 1640  LIPASE 16   Coagulation Profile:  Recent Labs Lab 11/03/16 1327  INR 5.06*   Cardiac Enzymes:  Recent Labs Lab 11/03/16 2239 11/04/16 0313  TROPONINI 0.28* 0.27*   Thyroid Function Tests:  Recent Labs  11/03/16 2239  TSH 2.991   Recent Results (from the past 240 hour(s))  MRSA PCR Screening     Status: Abnormal   Collection Time: 11/04/16 12:39 AM  Result Value Ref Range Status   MRSA by PCR POSITIVE (A) NEGATIVE Final    Comment:        The GeneXpert MRSA Assay (FDA approved for NASAL specimens only), is one component of a comprehensive MRSA colonization surveillance program. It is not intended to diagnose MRSA infection nor  to guide or monitor treatment for MRSA infections. RESULT CALLED TO, READ BACK BY AND VERIFIED WITH: CAITLYN,RN  ON 4N @0241  11/04/16 [facility]     Radiology Studies: Dg Chest Port 1 View  Result Date: 11/03/2016 CLINICAL DATA:  Shortness of Breath EXAM: PORTABLE CHEST 1 VIEW COMPARISON:  08/19/2014 FINDINGS: Cardiomegaly is noted. Central mild vascular congestion without convincing pulmonary edema. Elevation of the right hemidiaphragm again noted. Bilateral basilar atelectasis. Single lead cardiac pacemaker is unchanged in position. No segmental infiltrate. IMPRESSION: Cardiomegaly. Central mild vascular congestion without convincing pulmonary edema. Mild basilar atelectasis. No segmental infiltrate. Electronically Signed   By: Lahoma Crocker M.D.   On: 11/03/2016 16:14   Scheduled Meds: . allopurinol  300 mg Oral Daily  . cephALEXin  250 mg Oral Q8H  . Chlorhexidine Gluconate Cloth  6 each Topical Q0600  . dutasteride  0.5 mg Oral Daily  . escitalopram  20 mg Oral Daily  . fenofibrate  160 mg Oral Daily  . mouth rinse  15 mL Mouth Rinse BID  . mupirocin ointment  1 application Nasal BID  . rOPINIRole  3 mg Oral Daily  . sodium chloride flush  3 mL Intravenous Q12H  . tamsulosin  0.4 mg Oral QPC breakfast   Continuous Infusions:   LOS: 1 day    Time spent: 20 minutes    Faye Ramsay, MD [facility] Pager [phone]  If 7PM-7AM, please contact night-coverage [email or web address] Password [email or web address] 11/04/2016, 9:08 AM

## 2016-11-04 NOTE — ED Notes (Signed)
Pt daughter informed of pt bed status, per pt request.

## 2016-11-04 NOTE — Progress Notes (Signed)
ANTICOAGULATION CONSULT NOTE - Initial Consult  Pharmacy Consult for coumadin Indication: atrial fibrillation  Allergies  Allergen Reactions  . Vicodin [Hydrocodone-Acetaminophen] Nausea And Vomiting  . Hydrocodone Nausea Only    Patient Measurements: Height: 5\' 7"  (170.2 cm) Weight: 248 lb 9.6 oz (112.8 kg) IBW/kg (Calculated) : 66.1   Vital Signs: Temp: 97.6 F (36.4 C) (03/29 0803) Temp Source: Oral (03/29 0803) BP: 98/64 (03/29 0803) Pulse Rate: 59 (03/29 0830)  Labs:  Recent Labs  11/03/16 1327  11/03/16 2239 11/04/16 0313 11/04/16 0953  HGB 7.6*  --  7.4* 7.6*  --   HCT 23.6*  --  23.3* 24.5*  --   PLT 160  --  174 173  --   LABPROT 48.3*  --   --   --  49.3*  INR 5.06*  --   --   --  5.41*  CREATININE 3.35*  < > 3.22* 3.35* 3.28*  TROPONINI  --   --  0.28* 0.27* 0.20*  < > = values in this interval not displayed.  Estimated Creatinine Clearance: 20.5 mL/min (A) (by C-G formula based on SCr of 3.28 mg/dL (H)).   Medical History: Past Medical History:  Diagnosis Date  . Coronary artery disease   . Diverticulitis   . Gout   . Hyperlipidemia   . MI (myocardial infarction)   . Nonischemic cardiomyopathy (HCC)    mild  . OSA on CPAP   . Permanent atrial fibrillation ([place])   . Pulmonary hypertension     Medications:  Facility-Administered Medications Prior to Admission  Medication Dose Route Frequency Provider Last Rate Last Dose  . betamethasone acetate-betamethasone sodium phosphate (CELESTONE) injection 12 mg  12 mg Intramuscular Once Edrick Kins, DPM       Prescriptions Prior to Admission  Medication Sig Dispense Refill Last Dose  . acetaminophen (TYLENOL) 650 MG CR tablet Take 650 mg by mouth every 8 (eight) hours as needed for pain.   Past Month at Unknown time  . allopurinol (ZYLOPRIM) 300 MG tablet Take 300 mg by mouth daily.   11/02/2016 at Unknown time  . cephALEXin (KEFLEX) 500 MG capsule Take 500 mg by mouth 3 (three) times daily.    11/02/2016 at Unknown time  . COLCRYS 0.6 MG tablet Take 0.6 mg by mouth as needed (flareups).    Past Month at Unknown time  . Docusate Calcium (STOOL SOFTENER PO) Take 2 tablets by mouth at bedtime.    11/02/2016 at Unknown time  . dutasteride (AVODART) 0.5 MG capsule Take 0.5 mg by mouth daily.   11/02/2016 at Unknown time  . fenofibrate 160 MG tablet Take 160 mg by mouth daily.   11/02/2016 at Unknown time  . furosemide (LASIX) 20 MG tablet Take 40 mg by mouth daily. swelling   11/02/2016 at Unknown time  . irbesartan (AVAPRO) 150 MG tablet Take 150 mg by mouth daily.   11/02/2016 at Unknown time  . loratadine (CLARITIN) 10 MG tablet Take 10 mg by mouth daily.   11/02/2016 at Unknown time  . mirtazapine (REMERON) 15 MG tablet Take 15 mg by mouth at bedtime.   11/02/2016 at Unknown time  . Multiple Vitamins-Minerals (PRESERVISION AREDS 2) CAPS Take 1 capsule by mouth daily.   11/02/2016 at Unknown time  . nitroGLYCERIN (NITROSTAT) 0.4 MG SL tablet Place 0.4 mg under the tongue every 5 (five) minutes as needed. Chest pain   prn  . Omega-3 Fatty Acids (OMEGA-3 PO) Take 1 capsule by mouth  2 (two) times daily.   11/02/2016 at Unknown time  . potassium chloride SA (K-DUR,KLOR-CON) 20 MEQ tablet Take 40 mEq by mouth daily.    11/02/2016 at Unknown time  . rOPINIRole (REQUIP) 3 MG tablet Take 1 tablet by mouth daily. Take 1 tab daily   11/02/2016 at Unknown time  . Tamsulosin HCl (FLOMAX) 0.4 MG CAPS Take 0.4 mg by mouth daily after breakfast.   11/02/2016 at Unknown time  . traMADol (ULTRAM) 50 MG tablet Take 50 mg by mouth 4 (four) times daily. For pain    11/02/2016 at Unknown time  . warfarin (COUMADIN) 5 MG tablet Take 1 tablet by mouth daily. Take 1 tab daily except Tuesday take half tab   11/02/2016 at Unknown time   Scheduled:  . allopurinol  300 mg Oral Daily  . cephALEXin  250 mg Oral Q8H  . Chlorhexidine Gluconate Cloth  6 each Topical Q0600  . dutasteride  0.5 mg Oral Daily  . fenofibrate  160 mg  Oral Daily  . insulin aspart  0-9 Units Subcutaneous TID WC  . mouth rinse  15 mL Mouth Rinse BID  . mupirocin ointment  1 application Nasal BID  . phytonadione  2.5 mg Oral Once  . sodium chloride flush  3 mL Intravenous Q12H  . tamsulosin  0.4 mg Oral QPC breakfast    Assessment: 83 yo male here with ARF/hyperkalemia. He has history of afib on coumadin PTA and pharmacy consulted to dose. Vitamin K 2.5mg  ordered today. -INR= 5.41 (up) , Hg= 7.6, FOBT pending, per notes-  some bleeding from varicose veins  Home coumadin dose: 5mg /day except take 2.5mg  on Tu  Goal of Therapy:  INR 2-3 Monitor platelets by anticoagulation protocol: Yes   Plan:  -Hold coumadin -Follow for further bleeding -Daily PT/INR  Hildred Laser, Pharm D 11/04/2016 11:21 AM

## 2016-11-04 NOTE — Consult Note (Signed)
Reason for Consult: AKI Referring Physician: Doyle Askew, MD  [name] is an 83 y.o. male.  HPI: [name] has a PMH significant for HTN, CAD, chronic A fib on coumadin, OSA on CPAP, pulmonary HTN, nonischemic CMP (EF 55-60%) s/p pacemaker, obesity, gout, and BPH who presented to his PCP's office on 11/03/16 c/o dyspnea, increasing lower extremity edema, and generalized weakness.  He was sent to the [facility] and noted to have bradycardia, hypoxia, anemia (Hgb of 7.6), hyperkalemia, supratherapeutic coumadin with INR of 5.06, and AKI with Scr of 3.35.  He was admitted for further evaluation.  He also had several episodes of profuse bleeding from varicose veins yesterday and was placed on antibiotics for an abscess on his scalp last week. We were consulted to further evaluate his AKI and metabolic abnormalities.  The trend in Scr is seen below.    Of note, he had been on Irbesartan and potassium supplements prior to admission, however these have been held on admission.  He was also taking Keflex for his staph infection on his scalp but denies any rash, nausea, vomiting, or diarrhea.  He was given one dose of Bactricm but this was stopped yesterday.  He denies any history of CKD, however we do not have any more recent labs since 2016 to review.   Trend in Creatinine: Creatinine, Ser  Date/Time Value Ref Range Status  11/04/2016 12:33 PM 3.39 (H) 0.61 - 1.24 mg/dL Final  11/04/2016 09:53 AM 3.28 (H) 0.61 - 1.24 mg/dL Final  11/04/2016 03:13 AM 3.35 (H) 0.61 - 1.24 mg/dL Final  11/03/2016 10:39 PM 3.22 (H) 0.61 - 1.24 mg/dL Final  11/03/2016 05:27 PM 3.30 (H) 0.61 - 1.24 mg/dL Final  11/03/2016 03:20 PM 3.36 (H) 0.61 - 1.24 mg/dL Final  11/03/2016 01:27 PM 3.35 (H) 0.61 - 1.24 mg/dL Final  08/19/2014 11:40 PM 1.30 0.50 - 1.35 mg/dL Final  11/08/2008 04:13 AM 1.04 0.4 - 1.5 mg/dL Final  11/07/2008 02:20 AM 1.22 0.4 - 1.5 mg/dL Final  11/06/2008 05:22 AM 1.00 0.4 - 1.5 mg/dL Final  11/05/2008 02:25 AM  0.87 0.4 - 1.5 mg/dL Final  11/04/2008 06:57 PM 0.94 0.4 - 1.5 mg/dL Final  01/23/2007 03:07 PM 0.63  Final    PMH:   Past Medical History:  Diagnosis Date  . Coronary artery disease   . Diverticulitis   . Gout   . Hyperlipidemia   . MI (myocardial infarction)   . Nonischemic cardiomyopathy (HCC)    mild  . OSA on CPAP   . Permanent atrial fibrillation ([place])   . Pulmonary hypertension     PSH:   Past Surgical History:  Procedure Laterality Date  . CARDIAC CATHETERIZATION  11/07/2008   nonischemic cardiomyopathy,pulmonary hypertension  . COLOSTOMY    . COLOSTOMY REVERSAL    . CORONARY ANGIOPLASTY  06/01/1999   successful to ostium of the first diagonal  . ESOPHAGOGASTRODUODENOSCOPY N/A 08/22/2013   Procedure: ESOPHAGOGASTRODUODENOSCOPY (EGD);  Surgeon: [name], MD;  Location: [facility] ENDOSCOPY;  Service: Endoscopy;  Laterality: N/A;  h/p in file cabinet, jackie  . ESOPHAGOGASTRODUODENOSCOPY N/A 11/05/2014   Procedure: ESOPHAGOGASTRODUODENOSCOPY (EGD);  Surgeon: Clarene Essex, MD;  Location: [facility] ENDOSCOPY;  Service: Endoscopy;  Laterality: N/A;  . HOT HEMOSTASIS N/A 11/05/2014   Procedure: HOT HEMOSTASIS (ARGON PLASMA COAGULATION/BICAP);  Surgeon: Clarene Essex, MD;  Location: [facility] ENDOSCOPY;  Service: Endoscopy;  Laterality: N/A;  . NM MYOCAR PERF WALL MOTION  11/24/2007   normal  . PERMANENT PACEMAKER INSERTION  10/04/2012  [employer]  . PERMANENT PACEMAKER INSERTION N/A 10/12/2011   Procedure: PERMANENT PACEMAKER INSERTION;  Surgeon: [name], MD;  Location: [place] CATH LAB;  Service: Cardiovascular;  Laterality: N/A;  . REPLACEMENT TOTAL KNEE    . SAVORY DILATION N/A 08/22/2013   Procedure: SAVORY DILATION;  Surgeon: [name], MD;  Location: [facility] ENDOSCOPY;  Service: Endoscopy;  Laterality: N/A;  . SHOULDER SURGERY    . US ECHOCARDIOGRAPHY  02/01/2011   LA is mod-severely dilated,AOV & root sclerotic,ca+ AOV leaflets    Allergies:  Allergies  Allergen Reactions  .  Vicodin [Hydrocodone-Acetaminophen] Nausea And Vomiting  . Hydrocodone Nausea Only    Medications:   Prior to Admission medications   Medication Sig Start Date End Date Taking? Authorizing Provider  acetaminophen (TYLENOL) 650 MG CR tablet Take 650 mg by mouth every 8 (eight) hours as needed for pain.   Yes Historical Provider, MD  allopurinol (ZYLOPRIM) 300 MG tablet Take 300 mg by mouth daily. 03/01/13  Yes Historical Provider, MD  cephALEXin (KEFLEX) 500 MG capsule Take 500 mg by mouth 3 (three) times daily. 10/29/16  Yes Historical Provider, MD  COLCRYS 0.6 MG tablet Take 0.6 mg by mouth as needed (flareups).  03/01/13  Yes Historical Provider, MD  Docusate Calcium (STOOL SOFTENER PO) Take 2 tablets by mouth at bedtime.    Yes Historical Provider, MD  dutasteride (AVODART) 0.5 MG capsule Take 0.5 mg by mouth daily.   Yes Historical Provider, MD  fenofibrate 160 MG tablet Take 160 mg by mouth daily.   Yes Historical Provider, MD  furosemide (LASIX) 20 MG tablet Take 40 mg by mouth daily. swelling   Yes Historical Provider, MD  irbesartan (AVAPRO) 150 MG tablet Take 150 mg by mouth daily. 10/14/16  Yes Historical Provider, MD  loratadine (CLARITIN) 10 MG tablet Take 10 mg by mouth daily.   Yes Historical Provider, MD  mirtazapine (REMERON) 15 MG tablet Take 15 mg by mouth at bedtime.   Yes Historical Provider, MD  Multiple Vitamins-Minerals (PRESERVISION AREDS 2) CAPS Take 1 capsule by mouth daily.   Yes Historical Provider, MD  nitroGLYCERIN (NITROSTAT) 0.4 MG SL tablet Place 0.4 mg under the tongue every 5 (five) minutes as needed. Chest pain   Yes Historical Provider, MD  Omega-3 Fatty Acids (OMEGA-3 PO) Take 1 capsule by mouth 2 (two) times daily.   Yes Historical Provider, MD  potassium chloride SA (K-DUR,KLOR-CON) 20 MEQ tablet Take 40 mEq by mouth daily.    Yes Historical Provider, MD  rOPINIRole (REQUIP) 3 MG tablet Take 1 tablet by mouth daily. Take 1 tab daily 08/02/15  Yes Historical  Provider, MD  Tamsulosin HCl (FLOMAX) 0.4 MG CAPS Take 0.4 mg by mouth daily after breakfast.   Yes Historical Provider, MD  traMADol (ULTRAM) 50 MG tablet Take 50 mg by mouth 4 (four) times daily. For pain    Yes Historical Provider, MD  warfarin (COUMADIN) 5 MG tablet Take 1 tablet by mouth daily. Take 1 tab daily except Tuesday take half tab 08/13/15  Yes Historical Provider, MD    Inpatient medications: . [START ON 11/05/2016] allopurinol  200 mg Oral Daily  . cephALEXin  250 mg Oral Q8H  . Chlorhexidine Gluconate Cloth  6 each Topical Q0600  . dutasteride  0.5 mg Oral Daily  . insulin aspart  0-9 Units Subcutaneous TID WC  . mouth rinse  15 mL Mouth Rinse BID  . mupirocin ointment  1 application Nasal BID  .  sodium chloride flush  3 mL Intravenous Q12H  . sodium polystyrene  30 g Oral Once  . tamsulosin  0.4 mg Oral QPC breakfast    Discontinued Meds:   Medications Discontinued During This Encounter  Medication Reason  . losartan (COZAAR) 100 MG tablet Patient has not taken in last 30 days  . Multiple Vitamin (MULTIVITAMIN WITH MINERALS) TABS tablet Patient has not taken in last 30 days  . escitalopram (LEXAPRO) 20 MG tablet Patient has not taken in last 30 days  . levofloxacin (LEVAQUIN) 500 MG tablet Patient has not taken in last 30 days  . oxyCODONE-acetaminophen (PERCOCET) 5-325 MG tablet Patient has not taken in last 30 days  . predniSONE (DELTASONE) 10 MG tablet Patient has not taken in last 30 days  . sertraline (ZOLOFT) 50 MG tablet Patient has not taken in last 30 days  . sulfamethoxazole-trimethoprim (BACTRIM DS,SEPTRA DS) 800-160 MG tablet Patient has not taken in last 30 days  . 0.9 %  sodium chloride infusion   . levofloxacin (LEVAQUIN) tablet 500 mg Patient has not taken in last 30 days  . sertraline (ZOLOFT) tablet 50 mg Patient has not taken in last 30 days  . predniSONE (DELTASONE) tablet 20 mg Completed Course  . sulfamethoxazole-trimethoprim (BACTRIM DS,SEPTRA  DS) 800-160 MG per tablet 1 tablet   . 0.9 %  sodium chloride infusion   . escitalopram (LEXAPRO) tablet 20 mg   . rOPINIRole (REQUIP) tablet 3 mg   . fenofibrate tablet 160 mg   . allopurinol (ZYLOPRIM) tablet 300 mg     Social History:  reports that he has never smoked. He has never used smokeless tobacco. He reports that he does not drink alcohol or use drugs.  Family History:   Family History  Problem Relation Age of Onset  . Cancer Mother   . Heart attack Father   . Diabetes Brother     Pertinent items are noted in HPI. Weight change:   Intake/Output Summary (Last 24 hours) at 11/04/16 1839 Last data filed at 11/04/16 1500  Gross per 24 hour  Intake          1142.25 ml  Output              200 ml  Net           942.25 ml   BP 106/60   Pulse (!) 58   Temp 98.7 F (37.1 C) (Oral)   Resp 20   Ht 5\' 7"  (1.702 m)   Wt 112.8 kg (248 lb 9.6 oz)   SpO2 100%   BMI 38.94 kg/m  Vitals:   11/04/16 1202 11/04/16 1400 11/04/16 1500 11/04/16 1645  BP: 97/76 130/60 106/60   Pulse: 76  (!) 120 (!) 58  Resp: 14 19 18 20   Temp:    98.7 F (37.1 C)  TempSrc:    Oral  SpO2: 97%  92% 100%  Weight:      Height:         General appearance: alert, cooperative and no distress Head: Normocephalic, without obvious abnormality, atraumatic Eyes: negative findings: lids and lashes normal, conjunctivae and sclerae normal and corneas clear Resp: diminished breath sounds bibasilar Cardio: irregularly irregular rhythm and no rub GI: soft, non-tender; bowel sounds normal; no masses,  no organomegaly Extremities: edema 2+ edema on right lower extremity, 1+ on left  Labs: Basic Metabolic Panel:  Recent Labs Lab 11/03/16 1327 11/03/16 1520 11/03/16 1640 11/03/16 1727 11/03/16 2239 11/04/16 0313 11/04/16 5102  11/04/16 1233  NA 136 135  --  136 136 134* 135 135  K 6.0* 6.3*  --  5.7* 5.8* 6.4* 6.3* 6.2*  CL 104 103  --  103 102 103 104 104  CO2 22 21*  --  23 23 23  19* 19*   GLUCOSE 86 87  --  81 109* 120* 111* 122*  BUN 84* 85*  --  84* 85* 87* 88* 89*  CREATININE 3.35* 3.36*  --  3.30* 3.22* 3.35* 3.28* 3.39*  ALBUMIN  --   --  3.2*  --   --  3.0*  --   --   CALCIUM 9.0 9.1  --  9.2 9.2 8.9 9.1 9.1  PHOS  --   --   --   --  6.1*  --   --   --    Liver Function Tests:  Recent Labs Lab 11/03/16 1640 11/04/16 0313  AST 173* 198*  ALT 90* 101*  ALKPHOS 51 50  BILITOT 1.3* 1.0  PROT 6.7 6.6  ALBUMIN 3.2* 3.0*    Recent Labs Lab 11/03/16 1640  LIPASE 16   No results for input(s): AMMONIA in the last 168 hours. CBC:  Recent Labs Lab 11/03/16 1327 11/03/16 2239 11/04/16 0313  WBC 4.4 4.7 4.9  HGB 7.6* 7.4* 7.6*  HCT 23.6* 23.3* 24.5*  MCV 102.6* 102.2* 102.9*  PLT 160 174 173   PT/INR: @LABRCNTIP (inr:5) Cardiac Enzymes: ) Recent Labs Lab 11/03/16 2239 11/04/16 0313 11/04/16 0953  TROPONINI 0.28* 0.27* 0.20*   CBG:  Recent Labs Lab 11/04/16 1152 11/04/16 1648  GLUCAP 140* 108*    Iron Studies:  Recent Labs Lab 11/04/16 0953  IRON 23*  TIBC 532*  FERRITIN 49    Xrays/Other Studies: Ct Chest Wo Contrast  Result Date: 11/04/2016 CLINICAL DATA:  Dyspnea and fatigue for several days.  Hypertension EXAM: CT CHEST WITHOUT CONTRAST TECHNIQUE: Multidetector CT imaging of the chest was performed following the standard protocol without IV contrast. COMPARISON:  Chest radiograph November 03, 2016 FINDINGS: Cardiovascular: There it is no appreciable thoracic aortic aneurysm. There is moderate calcification at the origins of the great vessels. There is atherosclerotic calcification in the aorta. There are multiple foci of coronary artery calcification. Pacemaker lead is attached to the right ventricle. There is cardiomegaly. Pericardium is not appreciably thickened. Main pulmonary outflow tract measures 3.8 cm in diameter. There is calcification in the left ventricular wall anteriorly. Mediastinum/Nodes: There is a focal area of  benign-appearing calcification in the left thyroid. Thyroid otherwise appears unremarkable. There is no appreciable thoracic adenopathy. Lungs/Pleura: There is a small pleural effusion on the right. There is bibasilar atelectatic change. There is also slight atelectasis in the posterior segments of each upper lobe. There are a few small scattered bullae in the upper lobes posteriorly. There is no edema or consolidation. On axial slice 64 series 8, there is a 4 mm nodular opacity in the posterior segment of the right upper lobe. There is mild lower lobe bronchiectatic change bilaterally. Upper Abdomen: There is atherosclerotic calcification in the abdominal aorta. There are cysts arising from each kidney, incompletely visualized. The largest of these cysts is on the left, measuring approximately 5 x 5 cm. Cyst arising from the posterior upper pole of the right kidney measures 3.2 x 3.1 cm. Musculoskeletal: There is degenerative change throughout the thoracic spine. There are no blastic or lytic bone lesions. IMPRESSION: Small right-sided pleural effusion. Patchy bibasilar atelectasis. No edema or consolidation. There is  a 4 mm nodular opacity in the posterior segment of the right upper lobe. No follow-up needed if patient is low-risk. Non-contrast chest CT can be considered in 12 months if patient is high-risk. This recommendation follows the consensus statement: Guidelines for Management of Incidental Pulmonary Nodules Detected on CT Images: From the Fleischner Society 2017; Radiology 2017; 284:228-243. Cardiomegaly. Multiple foci of atherosclerotic calcification including areas of coronary artery calcification. Enlargement of the main pulmonary outflow tract suggests a degree of underlying pulmonary hypertension. Calcification in the left ventricular wall is likely indicative of prior myocardial infarct. No evident adenopathy. Electronically Signed   By: Lowella Grip III M.D.   On: 11/04/2016 10:30   US  Renal  Result Date: 11/04/2016 CLINICAL DATA:  Acute renal failure EXAM: RENAL / URINARY TRACT ULTRASOUND COMPLETE COMPARISON:  Abdominal ultrasound 01/25/2006 FINDINGS: Right Kidney: Length: 13.8 cm. 4.3 cm right midpole cyst, 4 cm right upper pole cyst. Echogenicity within normal limits. No mass or hydronephrosis visualized. Left Kidney: Length: 13.8 cm. 7 cm left lower pole cyst. 3 cm and 2 cm central cysts. Echogenicity within normal limits. No mass or hydronephrosis visualized. Bladder: Negative IMPRESSION: Bilateral renal cysts.  No renal obstruction.  Normal renal size. Electronically Signed   By: Franchot Gallo M.D.   On: 11/04/2016 16:39   Dg Chest Port 1 View  Result Date: 11/03/2016 CLINICAL DATA:  Shortness of Breath EXAM: PORTABLE CHEST 1 VIEW COMPARISON:  08/19/2014 FINDINGS: Cardiomegaly is noted. Central mild vascular congestion without convincing pulmonary edema. Elevation of the right hemidiaphragm again noted. Bilateral basilar atelectasis. Single lead cardiac pacemaker is unchanged in position. No segmental infiltrate. IMPRESSION: Cardiomegaly. Central mild vascular congestion without convincing pulmonary edema. Mild basilar atelectasis. No segmental infiltrate. Electronically Signed   By: Lahoma Crocker M.D.   On: 11/03/2016 16:14   US Abdomen Limited Ruq  Result Date: 11/04/2016 CLINICAL DATA:  Transaminitis EXAM: US ABDOMEN LIMITED - RIGHT UPPER QUADRANT COMPARISON:  Ultrasound 01/25/2006 FINDINGS: Gallbladder: Negative for gallstones. Gallbladder wall thickened 5.2 mm. Negative sonographic Murphy sign. Common bile duct: Diameter: 2.6 mm Liver: Increased echogenicity liver diffusely most compatible with fatty infiltration. No focal liver lesion. No ascites in the right upper quadrant IMPRESSION: Gallbladder wall thickening without gallstones or positive Murphy sign. Possible acute or chronic cholecystitis. Also possible causes of gallbladder wall thickening would include liver failure  and heart failure. Echogenic liver compatible with fatty infiltration. Electronically Signed   By: Franchot Gallo M.D.   On: 11/04/2016 16:41     Assessment/Plan: 1.  AKI- in setting of decompensated CHF, ABLA, with concomitant ARB therapy.  Likely ischemic ATN.  Continue to hold ARB and KCl.  2. Hyperkalemia- due to #1 and K supplements with ARB therapy 3. Acute on chronic diastolic CHF- 8lb weight gain with evidence of volume overload.  Agree with Dr. [name] and will dose IV Lasix and follow I's/O's as well as electrolytes and renal function. 4. ABLA- related to [place] and varicose veins.  He may need blood transfusion.  Continue to follow.  5. Supra-therapeutic INR-  Coumadin on hold 6. CAD with nonischemic CMP.  ^ troponin, per Cardiology 7. Abnormal LFT's- check hepatitis panel.  Hold allopurinol and other meds.  Abdominal US revealed possible fatty liver.  Also had thickening of gallbladder wall, w/u per primary svc.  Follow LFT's.   Governor Rooks Belisa Eichholz 11/04/2016, 6:39 PM

## 2016-11-04 NOTE — Consult Note (Signed)
[place] Nurse wound consult note Reason for Consult: RLE wound Wound type: Venous stasis ulcer, full thickness Right medial pretibial Pressure Injury POA: n/a Measurement:2cm x 2cm x 1cm Wound bed:100% red Drainage (amount, consistency, odor) copious amt brown bloody drainage, no odor noted Periwound: macerated with wound drainage Dressing procedure/placement/frequency: I have provided nurses with orders for To RLE ulcer, Cleanse with NS, gently pat dry, apply small piece of Algisite alginate, cover with gauze, wrap with kerlix then 4" ace wrap in a spiral fashion from foot to patellar notch, change daily. Pt states he has two nurses in his family that can help with wound care at home. No one present with pt now. I instructed pt in importance of technique of ace wrap. He uses coban and changes weekly at home.  Explained that we want to see the wound everyday while he is here. We will not follow, but will remain available to this patient, to nursing, and the medical and/or surgical teams.  Please re-consult if we need to assist further.    Fara Olden, RN-C, WTA-C Wound Treatment Associate

## 2016-11-04 NOTE — Consult Note (Signed)
Cardiology Consult    Patient ID: [name] MRN: [id number], DOB/AGE: 1932-02-12   Admit date: 11/03/2016 Date of Consult: 11/04/2016  Primary Physician: [name], MD Primary Cardiologist: Dr. [name] Requesting Provider: Dr. Doyle Askew    Reason for Consult: elevated troponin  Patient Profile    [name] in an 83 yo male with a PMH significant for CAD, permanent Afib on coumadin s/p single-chamber pacemaker (2013), OSA on CPAP, pulmonary hypertension, HTN, nonischemic cardiomyopathy, obesity, gout, and BPH.    Past Medical History   Past Medical History:  Diagnosis Date  . Coronary artery disease   . Diverticulitis   . Gout   . Hyperlipidemia   . MI (myocardial infarction)   . Nonischemic cardiomyopathy (HCC)    mild  . OSA on CPAP   . Permanent atrial fibrillation ([place])   . Pulmonary hypertension     Past Surgical History:  Procedure Laterality Date  . CARDIAC CATHETERIZATION  11/07/2008   nonischemic cardiomyopathy,pulmonary hypertension  . COLOSTOMY    . COLOSTOMY REVERSAL    . CORONARY ANGIOPLASTY  06/01/1999   successful to ostium of the first diagonal  . ESOPHAGOGASTRODUODENOSCOPY N/A 08/22/2013   Procedure: ESOPHAGOGASTRODUODENOSCOPY (EGD);  Surgeon: [name], MD;  Location: [facility] ENDOSCOPY;  Service: Endoscopy;  Laterality: N/A;  h/p in file cabinet, jackie  . ESOPHAGOGASTRODUODENOSCOPY N/A 11/05/2014   Procedure: ESOPHAGOGASTRODUODENOSCOPY (EGD);  Surgeon: Clarene Essex, MD;  Location: [facility] ENDOSCOPY;  Service: Endoscopy;  Laterality: N/A;  . HOT HEMOSTASIS N/A 11/05/2014   Procedure: HOT HEMOSTASIS (ARGON PLASMA COAGULATION/BICAP);  Surgeon: Clarene Essex, MD;  Location: [facility] ENDOSCOPY;  Service: Endoscopy;  Laterality: N/A;  . NM MYOCAR PERF WALL MOTION  11/24/2007   normal  . PERMANENT PACEMAKER INSERTION  10/04/2012   [employer]  . PERMANENT PACEMAKER INSERTION N/A 10/12/2011   Procedure: PERMANENT PACEMAKER INSERTION;  Surgeon: [name], MD;   Location: [place] CATH LAB;  Service: Cardiovascular;  Laterality: N/A;  . REPLACEMENT TOTAL KNEE    . SAVORY DILATION N/A 08/22/2013   Procedure: SAVORY DILATION;  Surgeon: [name], MD;  Location: [facility] ENDOSCOPY;  Service: Endoscopy;  Laterality: N/A;  . SHOULDER SURGERY    . US ECHOCARDIOGRAPHY  02/01/2011   LA is mod-severely dilated,AOV & root sclerotic,ca+ AOV leaflets     Allergies  Allergies  Allergen Reactions  . Vicodin [Hydrocodone-Acetaminophen] Nausea And Vomiting  . Hydrocodone Nausea Only    History of Present Illness      In 2010 he had normal coronary arteries at angiography and LV EF by LV angina was 40-45%, in 2012 echo showed LV EF of 50-55%. In 2015 EF was 55-60% by echo. A consistent abnormality both by direct cath measurement and echo estimation has been the presence of moderate pulmonary artery hypertension with a PA pressure of around 45 mmHg.  [name] was seen in clinic on 09/09/15 and he was in his usual state of health. He has had steady improvement in his LVEF. Pulmonary hypertension and diastolic heart failure was thought to be due to tachycardia; he has regained some function with his controlled Afib.   On my interview, the patient presented to the hospital after seeing his PCP for worsening SOB and bleeding from varicose veins in his right leg. Upon arrival in the ED, his INR was 5.06. Pharmacy was consulted for recommendations on coumadin dosing. He states he may have been taking his medication incorrectly, but wasn't sure.  He states he has had worsening  shortness of breath with exertion and swelling in his lower extremities. He takes a lasix regimen at home, but did not increase the dose. He is the primary care giver for his wife at home. He has been instructed to walk 10 min every hour by a provider. He stated he could not walk for 10 min and adjusted to 5 min every 30 min. He was also found to be hyperkalemic at admission; primary team is temporizing.      Inpatient Medications    . [START ON 11/05/2016] allopurinol  200 mg Oral Daily  . cephALEXin  250 mg Oral Q8H  . Chlorhexidine Gluconate Cloth  6 each Topical Q0600  . dutasteride  0.5 mg Oral Daily  . insulin aspart  0-9 Units Subcutaneous TID WC  . mouth rinse  15 mL Mouth Rinse BID  . mupirocin ointment  1 application Nasal BID  . sodium chloride flush  3 mL Intravenous Q12H  . tamsulosin  0.4 mg Oral QPC breakfast     Outpatient Medications    Prior to Admission medications   Medication Sig Start Date End Date Taking? Authorizing Provider  acetaminophen (TYLENOL) 650 MG CR tablet Take 650 mg by mouth every 8 (eight) hours as needed for pain.   Yes Historical Provider, MD  allopurinol (ZYLOPRIM) 300 MG tablet Take 300 mg by mouth daily. 03/01/13  Yes Historical Provider, MD  cephALEXin (KEFLEX) 500 MG capsule Take 500 mg by mouth 3 (three) times daily. 10/29/16  Yes Historical Provider, MD  COLCRYS 0.6 MG tablet Take 0.6 mg by mouth as needed (flareups).  03/01/13  Yes Historical Provider, MD  Docusate Calcium (STOOL SOFTENER PO) Take 2 tablets by mouth at bedtime.    Yes Historical Provider, MD  dutasteride (AVODART) 0.5 MG capsule Take 0.5 mg by mouth daily.   Yes Historical Provider, MD  fenofibrate 160 MG tablet Take 160 mg by mouth daily.   Yes Historical Provider, MD  furosemide (LASIX) 20 MG tablet Take 40 mg by mouth daily. swelling   Yes Historical Provider, MD  irbesartan (AVAPRO) 150 MG tablet Take 150 mg by mouth daily. 10/14/16  Yes Historical Provider, MD  loratadine (CLARITIN) 10 MG tablet Take 10 mg by mouth daily.   Yes Historical Provider, MD  mirtazapine (REMERON) 15 MG tablet Take 15 mg by mouth at bedtime.   Yes Historical Provider, MD  Multiple Vitamins-Minerals (PRESERVISION AREDS 2) CAPS Take 1 capsule by mouth daily.   Yes Historical Provider, MD  nitroGLYCERIN (NITROSTAT) 0.4 MG SL tablet Place 0.4 mg under the tongue every 5 (five) minutes as needed.  Chest pain   Yes Historical Provider, MD  Omega-3 Fatty Acids (OMEGA-3 PO) Take 1 capsule by mouth 2 (two) times daily.   Yes Historical Provider, MD  potassium chloride SA (K-DUR,KLOR-CON) 20 MEQ tablet Take 40 mEq by mouth daily.    Yes Historical Provider, MD  rOPINIRole (REQUIP) 3 MG tablet Take 1 tablet by mouth daily. Take 1 tab daily 08/02/15  Yes Historical Provider, MD  Tamsulosin HCl (FLOMAX) 0.4 MG CAPS Take 0.4 mg by mouth daily after breakfast.   Yes Historical Provider, MD  traMADol (ULTRAM) 50 MG tablet Take 50 mg by mouth 4 (four) times daily. For pain    Yes Historical Provider, MD  warfarin (COUMADIN) 5 MG tablet Take 1 tablet by mouth daily. Take 1 tab daily except Tuesday take half tab 08/13/15  Yes Historical Provider, MD     Family History  Family History  Problem Relation Age of Onset  . Cancer Mother   . Heart attack Father   . Diabetes Brother     Social History    Social History   Social History  . Marital status: Married    Spouse name: N/A  . Number of children: N/A  . Years of education: N/A   Occupational History  . Not on file.   Social History Main Topics  . Smoking status: Never Smoker  . Smokeless tobacco: Never Used  . Alcohol use No  . Drug use: No  . Sexual activity: Not on file   Other Topics Concern  . Not on file   Social History Narrative  . No narrative on file     Review of Systems    General:  No chills, fever, night sweats or weight changes.  Cardiovascular:  No chest pain, Positive for dyspnea on exertion and edema, No orthopnea, palpitations, paroxysmal nocturnal dyspnea. Dermatological: No rash, lesions/masses Respiratory: Positive cough and dyspnea Urologic: No hematuria, dysuria Abdominal:   No nausea, vomiting, diarrhea, bright red blood per rectum, melena, or hematemesis; positive for "heavy feeling" in his abdomen Neurologic:  No visual changes, positive wkns, No changes in mental status. All other systems  reviewed and are otherwise negative except as noted above.  Physical Exam    Blood pressure 97/76, pulse 76, temperature 97.7 F (36.5 C), temperature source Oral, resp. rate 14, height 5\' 7"  (1.702 m), weight 248 lb 9.6 oz (112.8 kg), SpO2 97 %.  General: Pleasant, NAD Psych: Normal affect. Neuro: Alert and oriented X 3. Moves all extremities spontaneously. HEENT: Normal  Neck: Supple without bruits, + JVD. Lungs:  Resp regular and unlabored, CTA, diminished in bases. Heart: Irregular rhythm, regular rate, no murmurs. Abdomen: Soft,non-tender, mildly distended, BS + x 4.  Extremities: No clubbing, cyanosis. 1+ - 2+ left extremity edema, right extremity wrapped. DP/PT/Radials faint and equal bilaterally.  Labs    Troponin [facility] Test)  Recent Labs  11/03/16 1732  TROPIPOC 0.13*    Recent Labs  11/03/16 2239 11/04/16 0313 11/04/16 0953  TROPONINI 0.28* 0.27* 0.20*   Lab Results  Component Value Date   WBC 4.9 11/04/2016   HGB 7.6 (L) 11/04/2016   HCT 24.5 (L) 11/04/2016   MCV 102.9 (H) 11/04/2016   PLT 173 11/04/2016    Recent Labs Lab 11/04/16 0313 11/04/16 0953  NA 134* 135  K 6.4* 6.3*  CL 103 104  CO2 23 19*  BUN 87* 88*  CREATININE 3.35* 3.28*  CALCIUM 8.9 9.1  PROT 6.6  --   BILITOT 1.0  --   ALKPHOS 50  --   ALT 101*  --   AST 198*  --   GLUCOSE 120* 111*   Lab Results  Component Value Date   CHOL  11/04/2008    173        ATP III CLASSIFICATION:  <200     mg/dL   Desirable  200-239  mg/dL   Borderline High  >=240    mg/dL   High          HDL 37 (L) 11/04/2008   LDLCALC (H) 11/04/2008    111        Total Cholesterol/HDL:CHD Risk Coronary Heart Disease Risk Table                     Men   Women  1/2 Average Risk   3.4  3.3  Average Risk       5.0   4.4  2 X Average Risk   9.6   7.1  3 X Average Risk  23.4   11.0        Use the calculated Patient Ratio above and the CHD Risk Table to determine the patient's CHD Risk.         ATP III CLASSIFICATION (LDL):  <100     mg/dL   Optimal  100-129  mg/dL   Near or Above                    Optimal  130-159  mg/dL   Borderline  160-189  mg/dL   High  >190     mg/dL   Very High   TRIG 126 11/04/2008   Lab Results  Component Value Date   DDIMER  11/04/2008    <0.22        AT THE INHOUSE ESTABLISHED CUTOFF VALUE OF 0.48 ug/mL FEU, THIS ASSAY HAS BEEN DOCUMENTED IN THE LITERATURE TO HAVE A SENSITIVITY AND NEGATIVE PREDICTIVE VALUE OF AT LEAST 98 TO 99%.  THE TEST RESULT SHOULD BE CORRELATED WITH AN ASSESSMENT OF THE CLINICAL PROBABILITY OF DVT / VTE.     Radiology Studies    Ct Chest Wo Contrast  Result Date: 11/04/2016 CLINICAL DATA:  Dyspnea and fatigue for several days.  Hypertension EXAM: CT CHEST WITHOUT CONTRAST TECHNIQUE: Multidetector CT imaging of the chest was performed following the standard protocol without IV contrast. COMPARISON:  Chest radiograph November 03, 2016 FINDINGS: Cardiovascular: There it is no appreciable thoracic aortic aneurysm. There is moderate calcification at the origins of the great vessels. There is atherosclerotic calcification in the aorta. There are multiple foci of coronary artery calcification. Pacemaker lead is attached to the right ventricle. There is cardiomegaly. Pericardium is not appreciably thickened. Main pulmonary outflow tract measures 3.8 cm in diameter. There is calcification in the left ventricular wall anteriorly. Mediastinum/Nodes: There is a focal area of benign-appearing calcification in the left thyroid. Thyroid otherwise appears unremarkable. There is no appreciable thoracic adenopathy. Lungs/Pleura: There is a small pleural effusion on the right. There is bibasilar atelectatic change. There is also slight atelectasis in the posterior segments of each upper lobe. There are a few small scattered bullae in the upper lobes posteriorly. There is no edema or consolidation. On axial slice 64 series 8, there is a 4 mm  nodular opacity in the posterior segment of the right upper lobe. There is mild lower lobe bronchiectatic change bilaterally. Upper Abdomen: There is atherosclerotic calcification in the abdominal aorta. There are cysts arising from each kidney, incompletely visualized. The largest of these cysts is on the left, measuring approximately 5 x 5 cm. Cyst arising from the posterior upper pole of the right kidney measures 3.2 x 3.1 cm. Musculoskeletal: There is degenerative change throughout the thoracic spine. There are no blastic or lytic bone lesions. IMPRESSION: Small right-sided pleural effusion. Patchy bibasilar atelectasis. No edema or consolidation. There is a 4 mm nodular opacity in the posterior segment of the right upper lobe. No follow-up needed if patient is low-risk. Non-contrast chest CT can be considered in 12 months if patient is high-risk. This recommendation follows the consensus statement: Guidelines for Management of Incidental Pulmonary Nodules Detected on CT Images: From the Fleischner Society 2017; Radiology 2017; 284:228-243. Cardiomegaly. Multiple foci of atherosclerotic calcification including areas of coronary artery calcification. Enlargement of the main pulmonary outflow tract suggests  a degree of underlying pulmonary hypertension. Calcification in the left ventricular wall is likely indicative of prior myocardial infarct. No evident adenopathy. Electronically Signed   By: Lowella Grip III M.D.   On: 11/04/2016 10:30   Dg Chest Port 1 View  Result Date: 11/03/2016 CLINICAL DATA:  Shortness of Breath EXAM: PORTABLE CHEST 1 VIEW COMPARISON:  08/19/2014 FINDINGS: Cardiomegaly is noted. Central mild vascular congestion without convincing pulmonary edema. Elevation of the right hemidiaphragm again noted. Bilateral basilar atelectasis. Single lead cardiac pacemaker is unchanged in position. No segmental infiltrate. IMPRESSION: Cardiomegaly. Central mild vascular congestion without  convincing pulmonary edema. Mild basilar atelectasis. No segmental infiltrate. Electronically Signed   By: Lahoma Crocker M.D.   On: 11/03/2016 16:14    ECG & Cardiac Imaging    EKG 11/03/16: Afib with V-pacing  Echocardiogram 11/04/16: pending   Echocardiogram 09/12/14: Study Conclusions - Left ventricle: The cavity size was normal. There was mild concentric hypertrophy. Systolic function was normal. The estimated ejection fraction was in the range of 55% to 60%. - Ventricular septum: Septal motion showed paradox. These changes are consistent with right ventricular pacing. - Aortic valve: There was trivial regurgitation. - Mitral valve: There was mild regurgitation. - Left atrium: The atrium was severely dilated. - Right ventricle: The cavity size was moderately dilated. - Right atrium: The atrium was severely dilated. - Tricuspid valve: There was moderate regurgitation. - Pulmonary arteries: Systolic pressure was mildly to moderately increased. PA peak pressure: 47 mm Hg (S).   Heart Cath 11/07/2008:  ASSESSMENT:  1. Nonischemic cardiomyopathy with ejection fraction of 40-45%.  2. Pulmonary hypertension with pulmonary artery pressure of 43/21.  3. Chronic atrial fibrillation.  4. Moderate-to-severe obstructive sleep apnea with uncontrollable leg      movement.   My plan is to restart him on Coumadin and consider Lovenox as a bridge.  If his insurance will allow and his family can administer this, he can  be discharged to home late today or tomorrow.  Otherwise, he will be  here until his INR is therapeutic  Assessment & Plan    1. CAD s/p heart cath with nonischemic cardiomyopathy (2010) / elevated troponin - troponin elevated 0.28 --> 0.27 --> 0.20 - EKG without clear ischemic changes - he denies any chest pain with exertion or at rest - troponin likely elevated in the presence of a heart failure exacerbation and likely right heart failure - D-dimer was  negative   2. Suspected acute on chronic diastolic heart failure - echocardiogram pending - BNP on admission was not collected, ordered - CXR with cardiomegaly and B pleural effusions - wt 248 lbs (248 lbs on admission; he was 240 lbs at last clinic visit with Dr. [name]); he is overall net positive; not on lasix regimen - consider starting lasix 40 mg IV BID, pending nephrology consult. Will defer lasix regimen to nephrology   3. Hyperkalemia - repeat BMP today with K 6.2; temporize per primary team, hold off on potassium supplementation with lasix - per nephrology   4. Acute kidney injury - records do not give a clear picture of his baseline creatinine; was normal in 2010; slightly elevated in 2016 (1.30); no history of CKD in EPIC - sCr 3.39 (3.28) - recommend nephrology consult - request bladder scan from nursing    Signed, Anthony Bottcher, PA-C 11/04/2016, 1:30 PM   I have seen and examined the patient along with Anthony Bottcher, PA.  I have reviewed the chart, notes  and new data.  I agree with PA's note.  Key new complaints: he presented due to bleeding in the setting of increased leg edema and prominent varicose veins, 7-8 lb weight gain and markedly increased INR (INR >5, had been 1.8 the week before, no change in meds made per his report). Reports difficulty voiding for last few weeks Key examination changes: edema is mild, rhythm is regular, no S3; weight up 8 lb since Jan 13, 15 lb since last Cardiology office visit. Key new findings / data:  - bladder scan without significant post void residual - creat 3.3 well above baseline 1.3 (1.5 on labs 10/05/16 from [facility] scanned in epic) and hyperkalemia -  mild elevation in transaminases to 100-220 range - improving - BNP 1000 (well above baseline). Echo pending. CXR with vascular congestion but without frank pulmonary edema.  PLAN: Overall impression is of fluid overload, but renal function is markedly  worse from recent baseline. Suspect INR and elevated AST/ALT are due to congestion-related liver dysfunction. I think he needs diuretic therapy, but am worried about his renal function. Asked Dr. Marval Regal to evaluate. Getting a renal US for hydronephrosis. Despite difficulty voiding, bladder scan does not show significant PVR. Will review echo as soon as performed.  [name], MD, Home [phone] 11/04/2016, 4:54 PM

## 2016-11-04 NOTE — Progress Notes (Signed)
CRITICAL VALUE ALERT  Critical value received: potassium 6.4  Date of notification: 11/04/16  Time of notification: 0450  Critical value read back: yes   Nurse who received alert: Kizzie Fantasia RN   MD notified (1st page):   K Schorr   Time of first page: [phone]  MD notified (2nd page):  K Schorr   Time of second page: [phone]  Responding MD: Lamar Blinks   Time MD responded: 1308

## 2016-11-04 NOTE — Progress Notes (Signed)
CRITICAL VALUE ALERT  Critical value received:  INR 5.41  Date of notification:  11/04/16  Time of notification:  4081  Critical value read back:Yes.    Nurse who received alert:  Raj Janus, RN  MD notified (1st page):  Dr. Doyle Askew  Time of first page:  1055  MD notified (2nd page):  Time of second page:  Responding MD:  Dr. Doyle Askew  Time MD responded:  1055

## 2016-11-05 ENCOUNTER — Inpatient Hospital Stay (HOSPITAL_COMMUNITY): Payer: Medicare Other

## 2016-11-05 DIAGNOSIS — D6869 Other thrombophilia: Secondary | ICD-10-CM

## 2016-11-05 DIAGNOSIS — R740 Nonspecific elevation of levels of transaminase and lactic acid dehydrogenase [LDH]: Secondary | ICD-10-CM

## 2016-11-05 DIAGNOSIS — R7401 Elevation of levels of liver transaminase levels: Secondary | ICD-10-CM

## 2016-11-05 DIAGNOSIS — R0600 Dyspnea, unspecified: Secondary | ICD-10-CM

## 2016-11-05 DIAGNOSIS — I2721 Secondary pulmonary arterial hypertension: Secondary | ICD-10-CM

## 2016-11-05 DIAGNOSIS — E875 Hyperkalemia: Secondary | ICD-10-CM

## 2016-11-05 LAB — CREATININE, URINE, RANDOM: CREATININE, URINE: 150.69 mg/dL

## 2016-11-05 LAB — BASIC METABOLIC PANEL
ANION GAP: 10 (ref 5–15)
BUN: 98 mg/dL — AB (ref 6–20)
CO2: 24 mmol/L (ref 22–32)
Calcium: 8.7 mg/dL — ABNORMAL LOW (ref 8.9–10.3)
Chloride: 102 mmol/L (ref 101–111)
Creatinine, Ser: 3.8 mg/dL — ABNORMAL HIGH (ref 0.61–1.24)
GFR, EST AFRICAN AMERICAN: 16 mL/min — AB (ref >60)
GFR, EST NON AFRICAN AMERICAN: 13 mL/min — AB (ref >60)
Glucose, Bld: 103 mg/dL — ABNORMAL HIGH (ref 65–99)
POTASSIUM: 5.8 mmol/L — AB (ref 3.5–5.1)
Sodium: 136 mmol/L (ref 135–145)

## 2016-11-05 LAB — CBC
HCT: 24 % — ABNORMAL LOW (ref 39.0–52.0)
Hemoglobin: 7.6 g/dL — ABNORMAL LOW (ref 13.0–17.0)
MCH: 32.3 pg (ref 26.0–34.0)
MCHC: 31.7 g/dL (ref 30.0–36.0)
MCV: 102.1 fL — AB (ref 78.0–100.0)
PLATELETS: 181 10*3/uL (ref 150–400)
RBC: 2.35 MIL/uL — AB (ref 4.22–5.81)
RDW: 17.2 % — ABNORMAL HIGH (ref 11.5–15.5)
WBC: 6.6 10*3/uL (ref 4.0–10.5)

## 2016-11-05 LAB — GLUCOSE, CAPILLARY
GLUCOSE-CAPILLARY: 98 mg/dL (ref 65–99)
Glucose-Capillary: 120 mg/dL — ABNORMAL HIGH (ref 65–99)
Glucose-Capillary: 110 mg/dL — ABNORMAL HIGH (ref 65–99)

## 2016-11-05 LAB — COMPREHENSIVE METABOLIC PANEL
ALBUMIN: 3 g/dL — AB (ref 3.5–5.0)
ALT: 109 U/L — AB (ref 17–63)
AST: 176 U/L — AB (ref 15–41)
Alkaline Phosphatase: 52 U/L (ref 38–126)
Anion gap: 12 (ref 5–15)
BUN: 93 mg/dL — AB (ref 6–20)
CO2: 21 mmol/L — AB (ref 22–32)
CREATININE: 3.53 mg/dL — AB (ref 0.61–1.24)
Calcium: 9 mg/dL (ref 8.9–10.3)
Chloride: 102 mmol/L (ref 101–111)
GFR calc Af Amer: 17 mL/min — ABNORMAL LOW (ref >60)
GFR calc non Af Amer: 15 mL/min — ABNORMAL LOW (ref >60)
GLUCOSE: 101 mg/dL — AB (ref 65–99)
POTASSIUM: 6 mmol/L — AB (ref 3.5–5.1)
Sodium: 135 mmol/L (ref 135–145)
Total Bilirubin: 1.2 mg/dL (ref 0.3–1.2)
Total Protein: 6.7 g/dL (ref 6.5–8.1)

## 2016-11-05 LAB — ECHOCARDIOGRAM COMPLETE
Height: 67 in
WEIGHTICAEL: 4000 [oz_av]

## 2016-11-05 LAB — SODIUM, URINE, RANDOM: Sodium, Ur: 24 mmol/L

## 2016-11-05 LAB — PROTIME-INR
INR: 4.26
Prothrombin Time: 42 seconds — ABNORMAL HIGH (ref 11.4–15.2)

## 2016-11-05 MED ORDER — SODIUM POLYSTYRENE SULFONATE 15 GM/60ML PO SUSP
15.0000 g | Freq: Once | ORAL | Status: AC
  Administered 2016-11-05: 15 g via ORAL
  Filled 2016-11-05: qty 60

## 2016-11-05 MED ORDER — FUROSEMIDE 10 MG/ML IJ SOLN
80.0000 mg | Freq: Two times a day (BID) | INTRAMUSCULAR | Status: DC
  Administered 2016-11-05 – 2016-11-08 (×6): 80 mg via INTRAVENOUS
  Filled 2016-11-05 (×6): qty 8

## 2016-11-05 MED ORDER — SODIUM POLYSTYRENE SULFONATE 15 GM/60ML PO SUSP
30.0000 g | Freq: Once | ORAL | Status: DC
  Filled 2016-11-05: qty 120

## 2016-11-05 MED ORDER — BISACODYL 10 MG RE SUPP
10.0000 mg | Freq: Every day | RECTAL | Status: DC | PRN
  Administered 2016-11-05: 10 mg via RECTAL
  Filled 2016-11-05: qty 1

## 2016-11-05 MED ORDER — PATIROMER SORBITEX CALCIUM 8.4 G PO PACK
8.4000 g | PACK | Freq: Every day | ORAL | Status: DC
  Administered 2016-11-05 – 2016-11-06 (×2): 8.4 g via ORAL
  Filled 2016-11-05 (×5): qty 4

## 2016-11-05 MED ORDER — PERFLUTREN LIPID MICROSPHERE
1.0000 mL | INTRAVENOUS | Status: AC | PRN
  Filled 2016-11-05: qty 10

## 2016-11-05 NOTE — Progress Notes (Signed)
  Echocardiogram 2D Echocardiogram has been performed.  [name] M 11/05/2016, 1:06 PM

## 2016-11-05 NOTE — Progress Notes (Signed)
Progress Note  Patient Name: [name] Date of Encounter: 11/05/2016  Primary Cardiologist: Kelly/ Yannely Kintzel  Subjective   No real symptomatic change. Still has difficulty voiding and mild dyspnea. No bleeding.  Inpatient Medications    Scheduled Meds: . allopurinol  200 mg Oral Daily  . cephALEXin  250 mg Oral Q8H  . Chlorhexidine Gluconate Cloth  6 each Topical Q0600  . dutasteride  0.5 mg Oral Daily  . furosemide  80 mg Intravenous BID  . insulin aspart  0-9 Units Subcutaneous TID WC  . mouth rinse  15 mL Mouth Rinse BID  . mupirocin ointment  1 application Nasal BID  . sodium chloride flush  3 mL Intravenous Q12H  . sodium polystyrene  30 g Oral Once  . tamsulosin  0.4 mg Oral QPC breakfast   Continuous Infusions:  PRN Meds: albuterol, guaiFENesin, oxyCODONE-acetaminophen   Vital Signs    Vitals:   11/05/16 0500 11/05/16 0809 11/05/16 1153 11/05/16 1700  BP:  (!) 114/50 119/65 (!) 86/61  Pulse:   (!) 59 60  Resp:  15 15 18   Temp:  98 F (36.7 C) 97.4 F (36.3 C) 97.8 F (36.6 C)  TempSrc:  Oral Oral Oral  SpO2:  96% 96% 97%  Weight: 113.4 kg (250 lb)     Height:        Intake/Output Summary (Last 24 hours) at 11/05/16 1715 Last data filed at 11/05/16 0800  Gross per 24 hour  Intake              120 ml  Output              330 ml  Net             -210 ml   Filed Weights   11/04/16 0045 11/04/16 0412 11/05/16 0500  Weight: 112.8 kg (248 lb 9.6 oz) 112.8 kg (248 lb 9.6 oz) 113.4 kg (250 lb)    Telemetry    V paced - Personally Reviewed  Physical Exam  Comfortable sitting in chair GEN: No acute distress.   Neck: No JVD Cardiac: RRR, split S2, no murmurs, rubs, or gallops. 2+ edema. Respiratory: Clear to auscultation bilaterally. GI: Soft, nontender, non-distended  MS: No edema; No deformity. Neuro:  Nonfocal  Psych: Normal affect   Labs    Chemistry Recent Labs Lab 11/03/16 1640  11/04/16 0313  11/04/16 1233 11/05/16 0236  11/05/16 1206  NA  --   < > 134*  < > 135 135 136  K  --   < > 6.4*  < > 6.2* 6.0* 5.8*  CL  --   < > 103  < > 104 102 102  CO2  --   < > 23  < > 19* 21* 24  GLUCOSE  --   < > 120*  < > 122* 101* 103*  BUN  --   < > 87*  < > 89* 93* 98*  CREATININE  --   < > 3.35*  < > 3.39* 3.53* 3.80*  CALCIUM  --   < > 8.9  < > 9.1 9.0 8.7*  PROT 6.7  --  6.6  --   --  6.7  --   ALBUMIN 3.2*  --  3.0*  --   --  3.0*  --   AST 173*  --  198*  --   --  176*  --   ALT 90*  --  101*  --   --  109*  --   ALKPHOS 51  --  50  --   --  52  --   BILITOT 1.3*  --  1.0  --   --  1.2  --   GFRNONAA  --   < > 16*  < > 15* 15* 13*  GFRAA  --   < > 18*  < > 18* 17* 16*  ANIONGAP  --   < > 8  < > 12 12 10   < > = values in this interval not displayed.   Hematology Recent Labs Lab 11/03/16 2239 11/04/16 0313 11/04/16 0953 11/05/16 0236  WBC 4.7 4.9  --  6.6  RBC 2.28* 2.38* 2.50* 2.35*  HGB 7.4* 7.6*  --  7.6*  HCT 23.3* 24.5*  --  24.0*  MCV 102.2* 102.9*  --  102.1*  MCH 32.5 31.9  --  32.3  MCHC 31.8 31.0  --  31.7  RDW 17.0* 17.1*  --  17.2*  PLT 174 173  --  181    Cardiac Enzymes Recent Labs Lab 11/03/16 2239 11/04/16 0313 11/04/16 0953  TROPONINI 0.28* 0.27* 0.20*    Recent Labs Lab 11/03/16 1358 11/03/16 1732  TROPIPOC 0.11* 0.13*     BNP Recent Labs Lab 11/04/16 1532  BNP 1,048.9*     DDimer No results for input(s): DDIMER in the last 168 hours.   Radiology    Ct Chest Wo Contrast  Result Date: 11/04/2016 CLINICAL DATA:  Dyspnea and fatigue for several days.  Hypertension EXAM: CT CHEST WITHOUT CONTRAST TECHNIQUE: Multidetector CT imaging of the chest was performed following the standard protocol without IV contrast. COMPARISON:  Chest radiograph November 03, 2016 FINDINGS: Cardiovascular: There it is no appreciable thoracic aortic aneurysm. There is moderate calcification at the origins of the great vessels. There is atherosclerotic calcification in the aorta. There are  multiple foci of coronary artery calcification. Pacemaker lead is attached to the right ventricle. There is cardiomegaly. Pericardium is not appreciably thickened. Main pulmonary outflow tract measures 3.8 cm in diameter. There is calcification in the left ventricular wall anteriorly. Mediastinum/Nodes: There is a focal area of benign-appearing calcification in the left thyroid. Thyroid otherwise appears unremarkable. There is no appreciable thoracic adenopathy. Lungs/Pleura: There is a small pleural effusion on the right. There is bibasilar atelectatic change. There is also slight atelectasis in the posterior segments of each upper lobe. There are a few small scattered bullae in the upper lobes posteriorly. There is no edema or consolidation. On axial slice 64 series 8, there is a 4 mm nodular opacity in the posterior segment of the right upper lobe. There is mild lower lobe bronchiectatic change bilaterally. Upper Abdomen: There is atherosclerotic calcification in the abdominal aorta. There are cysts arising from each kidney, incompletely visualized. The largest of these cysts is on the left, measuring approximately 5 x 5 cm. Cyst arising from the posterior upper pole of the right kidney measures 3.2 x 3.1 cm. Musculoskeletal: There is degenerative change throughout the thoracic spine. There are no blastic or lytic bone lesions. IMPRESSION: Small right-sided pleural effusion. Patchy bibasilar atelectasis. No edema or consolidation. There is a 4 mm nodular opacity in the posterior segment of the right upper lobe. No follow-up needed if patient is low-risk. Non-contrast chest CT can be considered in 12 months if patient is high-risk. This recommendation follows the consensus statement: Guidelines for Management of Incidental Pulmonary Nodules Detected on CT Images: From the Fleischner Society 2017; Radiology 2017;  [id number]. Cardiomegaly. Multiple foci of atherosclerotic calcification including areas of coronary  artery calcification. Enlargement of the main pulmonary outflow tract suggests a degree of underlying pulmonary hypertension. Calcification in the left ventricular wall is likely indicative of prior myocardial infarct. No evident adenopathy. Electronically Signed   By: Lowella Grip III M.D.   On: 11/04/2016 10:30   US Renal  Result Date: 11/04/2016 CLINICAL DATA:  Acute renal failure EXAM: RENAL / URINARY TRACT ULTRASOUND COMPLETE COMPARISON:  Abdominal ultrasound 01/25/2006 FINDINGS: Right Kidney: Length: 13.8 cm. 4.3 cm right midpole cyst, 4 cm right upper pole cyst. Echogenicity within normal limits. No mass or hydronephrosis visualized. Left Kidney: Length: 13.8 cm. 7 cm left lower pole cyst. 3 cm and 2 cm central cysts. Echogenicity within normal limits. No mass or hydronephrosis visualized. Bladder: Negative IMPRESSION: Bilateral renal cysts.  No renal obstruction.  Normal renal size. Electronically Signed   By: Franchot Gallo M.D.   On: 11/04/2016 16:39   US Abdomen Limited Ruq  Result Date: 11/04/2016 CLINICAL DATA:  Transaminitis EXAM: US ABDOMEN LIMITED - RIGHT UPPER QUADRANT COMPARISON:  Ultrasound 01/25/2006 FINDINGS: Gallbladder: Negative for gallstones. Gallbladder wall thickened 5.2 mm. Negative sonographic Murphy sign. Common bile duct: Diameter: 2.6 mm Liver: Increased echogenicity liver diffusely most compatible with fatty infiltration. No focal liver lesion. No ascites in the right upper quadrant IMPRESSION: Gallbladder wall thickening without gallstones or positive Murphy sign. Possible acute or chronic cholecystitis. Also possible causes of gallbladder wall thickening would include liver failure and heart failure. Echogenic liver compatible with fatty infiltration. Electronically Signed   By: Franchot Gallo M.D.   On: 11/04/2016 16:41    Cardiac Studies   Echo 11/05/16  - Left ventricle: The cavity size was moderately dilated. There was   moderate concentric hypertrophy.  Systolic function was normal.   The estimated ejection fraction was in the range of 50% to 55%.   Wall motion was normal; there were no regional wall motion   abnormalities. Doppler parameters are consistent with restrictive   physiology, indicative of decreased left ventricular diastolic   compliance and/or increased left atrial pressure. Doppler   parameters are consistent with elevated ventricular end-diastolic   filling pressure. - Ventricular septum: Septal motion showed paradox. - Aortic valve: Trileaflet; moderately thickened, moderately   calcified leaflets. Valve mobility was restricted. Transvalvular   velocity was within the normal range. There was no stenosis.   There was mild regurgitation. - Mitral valve: Calcified annulus. Mildly thickened leaflets .   There was mild regurgitation. - Left atrium: The atrium was severely dilated. - Right ventricle: The cavity size was moderately dilated. Wall   thickness was normal. Systolic function was mildly reduced. - Right atrium: The atrium was severely dilated. Pacer wire or   catheter noted in right atrium. - Tricuspid valve: There was moderate-severe regurgitation. - Pulmonic valve: There was mild regurgitation. - Pulmonary arteries: Systolic pressure was moderately increased.   PA peak pressure: 43 mm Hg (S). - Inferior vena cava: The vessel was dilated. The respirophasic   diameter changes were blunted (< 50%), consistent with elevated   central venous pressure.  Impressions:  - No significant difference since the prior study on 09/12/2014.  Patient Profile     83 y.o. male with chronic diastolic heart failure, OSA on CPAP, chronic atrial fibrillation, CAD presents with volume overload, coagulopathy, acute renal insufficiency, hyperkalemia with uncertain cause (recent Bactrim Rx).  Assessment & Plan    1. CHF:  No change  in LV function by echo, same signs of RV dysfunction due to cor pulmonale. Echo interpreted as  showing increased filling pressure (this fits clinically but note that interpretation of filling pressures is not accurate with AFib). Will increase the diuretic dose. Right heart failure is probably the dominant abnormality. Reports good compliance with CPAP, but believes his CPAP machine is outdated. 2. AFib: permanent, slow response with high prevalence of V pacing even without rate control meds, unchanged.  3. PPM: mostly V paced, but this is not new. 4. Coagulopathy: presumably explained by passive liver congestion. [place] raised possibility of fatty liver and cholecystitis, but these changes might be explained by volume overload. 5. Acute renal insuff:  Trimethoprim related (note marked and persistent hyperkalemia). No obstruction on [place]. 6. CAD: no angina. Troponin elevation is mild and unchanging, not consistent with acute coronary sd.  Signed, [name], MD  11/05/2016, 5:15 PM

## 2016-11-05 NOTE — Progress Notes (Signed)
ANTICOAGULATION CONSULT NOTE - Initial Consult  Pharmacy Consult for coumadin Indication: atrial fibrillation  Allergies  Allergen Reactions  . Vicodin [Hydrocodone-Acetaminophen] Nausea And Vomiting  . Hydrocodone Nausea Only    Patient Measurements: Height: 5\' 7"  (170.2 cm) Weight: 250 lb (113.4 kg) IBW/kg (Calculated) : 66.1   Vital Signs: Temp: 98 F (36.7 C) (03/30 0809) Temp Source: Oral (03/30 0809) BP: 114/50 (03/30 0809) Pulse Rate: 60 (03/29 2300)  Labs:  Recent Labs  11/03/16 1327  11/03/16 2239 11/04/16 0313 11/04/16 0953 11/04/16 1233 11/05/16 0236  HGB 7.6*  --  7.4* 7.6*  --   --  7.6*  HCT 23.6*  --  23.3* 24.5*  --   --  24.0*  PLT 160  --  174 173  --   --  181  LABPROT 48.3*  --   --   --  49.3*  --  42.0*  INR 5.06*  --   --   --  5.41*  --  4.26*  CREATININE 3.35*  < > 3.22* 3.35* 3.28* 3.39* 3.53*  TROPONINI  --   --  0.28* 0.27* 0.20*  --   --   < > = values in this interval not displayed.  Estimated Creatinine Clearance: 19.1 mL/min (A) (by C-G formula based on SCr of 3.53 mg/dL (H)).   Medical History: Past Medical History:  Diagnosis Date  . Coronary artery disease   . Diverticulitis   . Gout   . Hyperlipidemia   . MI (myocardial infarction)   . Nonischemic cardiomyopathy (HCC)    mild  . OSA on CPAP   . Permanent atrial fibrillation ([place])   . Pulmonary hypertension     Medications:  Facility-Administered Medications Prior to Admission  Medication Dose Route Frequency Provider Last Rate Last Dose  . betamethasone acetate-betamethasone sodium phosphate (CELESTONE) injection 12 mg  12 mg Intramuscular Once Edrick Kins, DPM       Prescriptions Prior to Admission  Medication Sig Dispense Refill Last Dose  . acetaminophen (TYLENOL) 650 MG CR tablet Take 650 mg by mouth every 8 (eight) hours as needed for pain.   Past Month at Unknown time  . allopurinol (ZYLOPRIM) 300 MG tablet Take 300 mg by mouth daily.   11/02/2016 at  Unknown time  . cephALEXin (KEFLEX) 500 MG capsule Take 500 mg by mouth 3 (three) times daily.   11/02/2016 at Unknown time  . COLCRYS 0.6 MG tablet Take 0.6 mg by mouth as needed (flareups).    Past Month at Unknown time  . Docusate Calcium (STOOL SOFTENER PO) Take 2 tablets by mouth at bedtime.    11/02/2016 at Unknown time  . dutasteride (AVODART) 0.5 MG capsule Take 0.5 mg by mouth daily.   11/02/2016 at Unknown time  . fenofibrate 160 MG tablet Take 160 mg by mouth daily.   11/02/2016 at Unknown time  . furosemide (LASIX) 20 MG tablet Take 40 mg by mouth daily. swelling   11/02/2016 at Unknown time  . irbesartan (AVAPRO) 150 MG tablet Take 150 mg by mouth daily.   11/02/2016 at Unknown time  . loratadine (CLARITIN) 10 MG tablet Take 10 mg by mouth daily.   11/02/2016 at Unknown time  . mirtazapine (REMERON) 15 MG tablet Take 15 mg by mouth at bedtime.   11/02/2016 at Unknown time  . Multiple Vitamins-Minerals (PRESERVISION AREDS 2) CAPS Take 1 capsule by mouth daily.   11/02/2016 at Unknown time  . nitroGLYCERIN (NITROSTAT) 0.4 MG SL  tablet Place 0.4 mg under the tongue every 5 (five) minutes as needed. Chest pain   prn  . Omega-3 Fatty Acids (OMEGA-3 PO) Take 1 capsule by mouth 2 (two) times daily.   11/02/2016 at Unknown time  . potassium chloride SA (K-DUR,KLOR-CON) 20 MEQ tablet Take 40 mEq by mouth daily.    11/02/2016 at Unknown time  . rOPINIRole (REQUIP) 3 MG tablet Take 1 tablet by mouth daily. Take 1 tab daily   11/02/2016 at Unknown time  . Tamsulosin HCl (FLOMAX) 0.4 MG CAPS Take 0.4 mg by mouth daily after breakfast.   11/02/2016 at Unknown time  . traMADol (ULTRAM) 50 MG tablet Take 50 mg by mouth 4 (four) times daily. For pain    11/02/2016 at Unknown time  . warfarin (COUMADIN) 5 MG tablet Take 1 tablet by mouth daily. Take 1 tab daily except Tuesday take half tab   11/02/2016 at Unknown time   Scheduled:  . allopurinol  200 mg Oral Daily  . cephALEXin  250 mg Oral Q8H  . Chlorhexidine  Gluconate Cloth  6 each Topical Q0600  . dutasteride  0.5 mg Oral Daily  . furosemide  40 mg Intravenous BID  . insulin aspart  0-9 Units Subcutaneous TID WC  . mouth rinse  15 mL Mouth Rinse BID  . mupirocin ointment  1 application Nasal BID  . sodium chloride flush  3 mL Intravenous Q12H  . tamsulosin  0.4 mg Oral QPC breakfast    Assessment: 83 yo male here with hyperkalemia in setting of AoCKD stage 3. He has history of afib on coumadin PTA and pharmacy consulted to dose. Vitamin K 2.5mg  given yesterday. INR now down to 4.26. Likely full effects not seen. FOBT positive, hgb stable at 7.6.  Home coumadin dose: 5mg /day except take 2.5mg  on Tu  Goal of Therapy:  INR 2-3 Monitor platelets by anticoagulation protocol: Yes   Plan:  -Hold coumadin -Follow for further bleeding -Daily PT/INR  Dierdre Harness, BS, PharmD Clinical Pharmacy Resident [phone] (Pager) 11/05/2016 10:44 AM

## 2016-11-05 NOTE — Progress Notes (Signed)
[facility] Rounding Note  Subjective/Interval History:  Frustrated about lack of progress Worried about his kidneys Not much diuretic effect from 80 lasix Needing repeated doses of kayexalate K staying high  Objective Vital signs in last 24 hours: Vitals:   11/05/16 0809 11/05/16 1153 11/05/16 1700 11/05/16 1727  BP: (!) 114/50 119/65 (!) 86/61 (!) 99/55  Pulse:  (!) 59 60 60  Resp: 15 15 18 16   Temp: 98 F (36.7 C) 97.4 F (36.3 C) 97.8 F (36.6 C)   TempSrc: Oral Oral Oral   SpO2: 96% 96% 97% 98%  Weight:      Height:       Weight change: 0.544 kg (1 lb 3.2 oz)  Intake/Output Summary (Last 24 hours) at 11/05/16 1737 Last data filed at 11/05/16 0800  Gross per 24 hour  Intake              120 ml  Output              330 ml  Net             -210 ml   Physical Exam:  Blood pressure (!) 99/55, pulse 60, temperature 97.8 F (36.6 C), temperature source Oral, resp. rate 16, height 5\' 7"  (1.702 m), weight 113.4 kg (250 lb), SpO2 98 %.  Delightful older man Looks younger than stated age Sitting in chair eating dinner + JVD Basilar crackles R>L Cardio: irregularly irregular rhythm and no rub GI: soft, non-tender; bowel sounds normal; no masses,  no organomegaly Extremities: edema 2+ edema on right lower extremity, 1+ on left RLE wrapped with ACE wrap  =  Recent Labs Lab 11/03/16 1727 11/03/16 2239 11/04/16 0313 11/04/16 0953 11/04/16 1233 11/05/16 0236 11/05/16 1206  NA 136 136 134* 135 135 135 136  K 5.7* 5.8* 6.4* 6.3* 6.2* 6.0* 5.8*  CL 103 102 103 104 104 102 102  CO2 23 23 23  19* 19* 21* 24  GLUCOSE 81 109* 120* 111* 122* 101* 103*  BUN 84* 85* 87* 88* 89* 93* 98*  CREATININE 3.30* 3.22* 3.35* 3.28* 3.39* 3.53* 3.80*  CALCIUM 9.2 9.2 8.9 9.1 9.1 9.0 8.7*  PHOS  --  6.1*  --   --   --   --   --    Liver Function Tests:  Recent Labs Lab 11/03/16 1640 11/04/16 0313 11/05/16 0236  AST 173* 198* 176*  ALT 90* 101* 109*  ALKPHOS 51 50 52  BILITOT 1.3* 1.0  1.2  PROT 6.7 6.6 6.7  ALBUMIN 3.2* 3.0* 3.0*    Recent Labs Lab 11/03/16 1640  LIPASE 16     Recent Labs Lab 11/03/16 1327 11/03/16 2239 11/04/16 0313 11/05/16 0236  WBC 4.4 4.7 4.9 6.6  HGB 7.6* 7.4* 7.6* 7.6*  HCT 23.6* 23.3* 24.5* 24.0*  MCV 102.6* 102.2* 102.9* 102.1*  PLT 160 174 173 181   Recent Labs Lab 11/03/16 2239 11/04/16 0313 11/04/16 0953  TROPONINI 0.28* 0.27* 0.20*     Recent Labs Lab 11/04/16 1152 11/04/16 1648 11/04/16 2116 11/05/16 1150 11/05/16 1649  GLUCAP 140* 108* 143* 120* 98      Recent Labs Lab 11/04/16 0953  IRON 23*  TIBC 532*  FERRITIN 49   Studies/Results: Ct Chest Wo Contrast  Result Date: 11/04/2016 CLINICAL DATA:  Dyspnea and fatigue for several days.  Hypertension EXAM: CT CHEST WITHOUT CONTRAST TECHNIQUE: Multidetector CT imaging of the chest was performed following the standard protocol without IV contrast. COMPARISON:  Chest radiograph  November 03, 2016 FINDINGS: Cardiovascular: There it is no appreciable thoracic aortic aneurysm. There is moderate calcification at the origins of the great vessels. There is atherosclerotic calcification in the aorta. There are multiple foci of coronary artery calcification. Pacemaker lead is attached to the right ventricle. There is cardiomegaly. Pericardium is not appreciably thickened. Main pulmonary outflow tract measures 3.8 cm in diameter. There is calcification in the left ventricular wall anteriorly. Mediastinum/Nodes: There is a focal area of benign-appearing calcification in the left thyroid. Thyroid otherwise appears unremarkable. There is no appreciable thoracic adenopathy. Lungs/Pleura: There is a small pleural effusion on the right. There is bibasilar atelectatic change. There is also slight atelectasis in the posterior segments of each upper lobe. There are a few small scattered bullae in the upper lobes posteriorly. There is no edema or consolidation. On axial slice 64 series 8,  there is a 4 mm nodular opacity in the posterior segment of the right upper lobe. There is mild lower lobe bronchiectatic change bilaterally. Upper Abdomen: There is atherosclerotic calcification in the abdominal aorta. There are cysts arising from each kidney, incompletely visualized. The largest of these cysts is on the left, measuring approximately 5 x 5 cm. Cyst arising from the posterior upper pole of the right kidney measures 3.2 x 3.1 cm. Musculoskeletal: There is degenerative change throughout the thoracic spine. There are no blastic or lytic bone lesions. IMPRESSION: Small right-sided pleural effusion. Patchy bibasilar atelectasis. No edema or consolidation. There is a 4 mm nodular opacity in the posterior segment of the right upper lobe. No follow-up needed if patient is low-risk. Non-contrast chest CT can be considered in 12 months if patient is high-risk. This recommendation follows the consensus statement: Guidelines for Management of Incidental Pulmonary Nodules Detected on CT Images: From the Fleischner Society 2017; Radiology 2017; 284:228-243. Cardiomegaly. Multiple foci of atherosclerotic calcification including areas of coronary artery calcification. Enlargement of the main pulmonary outflow tract suggests a degree of underlying pulmonary hypertension. Calcification in the left ventricular wall is likely indicative of prior myocardial infarct. No evident adenopathy. Electronically Signed   By: Lowella Grip III M.D.   On: 11/04/2016 10:30   US Renal  Result Date: 11/04/2016 CLINICAL DATA:  Acute renal failure EXAM: RENAL / URINARY TRACT ULTRASOUND COMPLETE COMPARISON:  Abdominal ultrasound 01/25/2006 FINDINGS: Right Kidney: Length: 13.8 cm. 4.3 cm right midpole cyst, 4 cm right upper pole cyst. Echogenicity within normal limits. No mass or hydronephrosis visualized. Left Kidney: Length: 13.8 cm. 7 cm left lower pole cyst. 3 cm and 2 cm central cysts. Echogenicity within normal limits. No  mass or hydronephrosis visualized. Bladder: Negative IMPRESSION: Bilateral renal cysts.  No renal obstruction.  Normal renal size. Electronically Signed   By: Franchot Gallo M.D.   On: 11/04/2016 16:39   US Abdomen Limited Ruq  Result Date: 11/04/2016 CLINICAL DATA:  Transaminitis EXAM: US ABDOMEN LIMITED - RIGHT UPPER QUADRANT COMPARISON:  Ultrasound 01/25/2006 FINDINGS: Gallbladder: Negative for gallstones. Gallbladder wall thickened 5.2 mm. Negative sonographic Murphy sign. Common bile duct: Diameter: 2.6 mm Liver: Increased echogenicity liver diffusely most compatible with fatty infiltration. No focal liver lesion. No ascites in the right upper quadrant IMPRESSION: Gallbladder wall thickening without gallstones or positive Murphy sign. Possible acute or chronic cholecystitis. Also possible causes of gallbladder wall thickening would include liver failure and heart failure. Echogenic liver compatible with fatty infiltration. Electronically Signed   By: Franchot Gallo M.D.   On: 11/04/2016 16:41   Medications:  . allopurinol  200 mg Oral Daily  . cephALEXin  250 mg Oral Q8H  . Chlorhexidine Gluconate Cloth  6 each Topical Q0600  . dutasteride  0.5 mg Oral Daily  . furosemide  80 mg Intravenous BID  . insulin aspart  0-9 Units Subcutaneous TID WC  . mouth rinse  15 mL Mouth Rinse BID  . mupirocin ointment  1 application Nasal BID  . sodium chloride flush  3 mL Intravenous Q12H  . sodium polystyrene  30 g Oral Once  . tamsulosin  0.4 mg Oral QPC breakfast   Background PMH HTN, CAD, chronic A fib on coumadin, OSA on CPAP, pulmonary HTN, nonischemic CM (EF 55-60%) s/p pacemaker, obesity, gout, and BPH. Presented to PCP's office on 11/03/16 c/o dyspnea, increasing LE edema, generalized weakness.  Sent to the [facility] -> bradycardia, hypoxia, anemia (Hgb of 7.6), hyperkalemia, supratherapeutic coumadin with INR of 5.06, and AKI with Scr of 3.35.   Last available creatinine in EPIC labs was from 08/2014  and was 1.3 at that time.  Dr. [name] notes a creatinine from [facility] from 10/05/16 of 1.5.Had several episodes of profuse bleeding from varicose veins PTA and was placed on antibiotics for an abscess on his scalp last week. We were consulted to further evaluate his AKI and metabolic abnormalities. Was on iIrbesartan and potassium supplements PTA, however these have been held on admission.  Was also taking Keflex for staph infection on his scalp but denied any rash, nausea, vomiting, or diarrhea.  Rec'd one dose of Bactrim but this was stopped.    Assessment/Plan: 1. AKI- in setting of decompensated R CHF, ABLA, concomitant ARB therapy. Had bactrim which was stopped.  Likely ischemic ATN.  Continue to hold ARB and KCl.  2. Hyperkalemia- due to #1 and K supplements with ARB therapy. Persistent. s/p kayexalate 30 gm X 2, 15 gm this AM. Add some patiromer until K comes down. Change diet to renal (to get K restriction)` 3. Acute on chronic diastolic CHF- 8lb weight gain with volume overload. Poor response to lasix 40 BID - cards has increased to 80 BID. ECHO shows stable LVF, signs of R heart dysfx.  4. ABLA- related to [place] and varicose veins.  He may need blood transfusion. 5. Supra-therapeutic INR-  Coumadin on hold 6. CAD with nonischemic CMP.  ^ troponin, per Cardiology 7. Abnormal LFT's- check hepatitis panel.  Hold allopurinol and other meds.  Abdominal US revealed possible fatty liver.  Also had thickening of gallbladder wall, w/u per primary svc.  Follow LFT's.   Jamal Maes, MD [facility] [phone] pager 11/05/2016, 5:37 PM

## 2016-11-05 NOTE — Progress Notes (Signed)
Patient ID: [name], male   DOB: 1932-06-02, 83 y.o.   MRN: [id number]    PROGRESS NOTE  [name]  PPJ:[id number] DOB: [birth year] DOA: 11/03/2016  PCP: [name], MD   Brief Narrative:  a 83 y.o. male with a-fib on coumadin, MI, non ischemic cardiomyopathy, s/p pacemaker, HTN, BPH (chronic urinary retention and taking Dutasteride and Tamsulosin), presented earlier to PCP office for dyspnea and was referred to ED due to multiple blood work abnormalities. Pt reports ongoing dyspnea for several days, poor oral intake and fatigue. No active chest pain, no urinary concerns. Pt explains he has had some "heaviness in the stomach" and felt full and was not able to eat. He does have varicose veins RLE and has ACE wrap but says earlier in the day he had lots of bleeding from the varicose veins, 6 episodes of significant blood loss, unable to quantify. Pt reports having recent scalp abscess that was lanced and tested + for staph, he has been on antibiotic for it.   ED Course: In ED, pt noted to be hemodynamically stable, VS notable for HR in 50-60's, initial oxygen saturation 88% on RA and pt placed on oxygen via [place], O2 sat up to 100%. Blood work notable for Hg 7.6, K 6 --> 6.3, Cr 3.35 --> 3.36. INR 5. Pt give dose of calcium gluconate and TRH asked to admit to step down unit due to severe electrolyte derangements.   Assessment & Plan:  Principal Problem:   ARF (acute renal failure) ([place]) imposed on CKD stage III - imposed on CKD stage III, GFR in 50's with stable Cr in January 2016, last report in 09/2016 Cr was 1.5  - likely multifactorial in etiology, pre renal --> ATN in the setting of bleeding, lasix use, losartan use, poor oral intake  - ARB's still on hold, Lasix 40 mg IV BID started 3/29, Cr is now trending up from 3.39 --> 3.53 - this is the weight trend in the past 48 hours  Filed Weights   11/04/16 0045 11/04/16 0412 11/05/16 0500  Weight: 112.8 kg (248 lb 9.6 oz) 112.8 kg  (248 lb 9.6 oz) 113.4 kg (250 lb)  - renal US with no evidence of hydronephrosis  - appreciate nephrology team assistance   Active Problems:   Hyperkalemia, hyperphosphatemia  - gave two doses of Ca gluconate in ED - also has gotten kayexalate x 2 on 3/29, also given one dose this AM 3/30 - K trend: 5.7 --> 5.8 --> 6.4 --> 6 this AM  - pt has been on K supplementation at home but this has been held since admission - repeat BMP this afternoon     Dyspnea - from acute blood loss anemia, component of pulmonary vascular congestion  - Hg has remained at 7.4 - 7.6 with no signs of active bleeding at this time  - keep on oxygen if needed to maintain O2 saturations > 90%    Acute blood loss anemia - in pt on coumadin, had some bleeding from varicose veins, RLE but not sure if that is the only source  - coumadin was held since admission, INR trending down: 5 --> 5.41 --> 4.26 - FOBT +, anemia panel with low iron  - pt has gotten one dose of Vit K 2.5 mg (3/29) - pt did have EGD in 2016 done by Dr. Watt Climes, noted few small AVM, I will call GI as well for further recommendations     Acute on  Chronic diastolic heart failure ([place]) - last ECHO in 2016 with stable EF - paced rhythm - monitor daily weights, strict I/O - ECHO pending  - pt started on Lasix 40 mg IV BID - weight trend since admission  Filed Weights   11/04/16 0045 11/04/16 0412 11/05/16 0500  Weight: 112.8 kg (248 lb 9.6 oz) 112.8 kg (248 lb 9.6 oz) 113.4 kg (250 lb)     Transaminitis  - unclear etiology, ? Congestive etiology  - monitor for now  - RUQ [place] also requested     RLE wound, varicose vein, venous stasis ulcer  - wound care consulted for assistance  - overall stable, healing     Atrial fibrillation, persistent, slow VR, on chronic anticoagulation    Pacemaker single chamber, [employer], 2013   Elevated troponin, demand ischemia in the setting of ARF  - INR supra therapeutic on admission and  coumadin has been held - INR overall trending down  - needs correction of K, kayexalate given this AM - cardiology assistance appreciate     BPH (benign prostatic hyperplasia) - resumed home medical regimen Tamsulosin and Dutasteride  - I/O to be monitored     Recent staph infection, scalp abscess - continue home keflex     Depression - pt appears more somnolent this AM  - may need to hold his escitalopram and ropinrole until pt more alert and awake     Obesity  - Body mass index is 38.94 kg/m.    OSA - on CPAP at night time   DVT prophylaxis: Coumadin on hold, SCD's requested  Code Status: Full  Family Communication: Patient at bedside, family updated over the phone  Disposition Plan: to be determined, pt not clinically stable for discharge at this time   Consultants:   Cardiology  Nephrology  WOC  GI  Procedures:   None  Antimicrobials:   Keflex continued from home for recent scalp abscess that was positive for staph infection   Subjective: Pt reports feeling better this AM but frustrated as he has not had BM yet.   Objective: Vitals:   11/05/16 0115 11/05/16 0300 11/05/16 0500 11/05/16 0809  BP:  (!) 114/57  (!) 114/50  Pulse:      Resp: 20   15  Temp:  97.9 F (36.6 C)  98 F (36.7 C)  TempSrc:  Oral  Oral  SpO2:      Weight:   113.4 kg (250 lb)   Height:        Intake/Output Summary (Last 24 hours) at 11/05/16 0831 Last data filed at 11/05/16 0630  Gross per 24 hour  Intake                0 ml  Output              530 ml  Net             -530 ml   Filed Weights   11/04/16 0045 11/04/16 0412 11/05/16 0500  Weight: 112.8 kg (248 lb 9.6 oz) 112.8 kg (248 lb 9.6 oz) 113.4 kg (250 lb)   Examination:  General exam: Appears more alert this AM, NAD Respiratory system: respiratory effort is stable crackles noted at bases, non productive cough also noted Cardiovascular system: paced rhythm, no rubs, gallops or clicks. +1 L LE edema, RLE in  ace wrap Gastrointestinal system: Abdomen is nondistended, soft and nontender. Normal bowel sounds heard. Central nervous system: alert and oriented x 3  Extremities: Symmetric 5 x 5 power.  Skin: RLE in ACE wrap  Data Reviewed: I have personally reviewed following labs and imaging studies  CBC:  Recent Labs Lab 11/03/16 1327 11/03/16 2239 11/04/16 0313 11/05/16 0236  WBC 4.4 4.7 4.9 6.6  HGB 7.6* 7.4* 7.6* 7.6*  HCT 23.6* 23.3* 24.5* 24.0*  MCV 102.6* 102.2* 102.9* 102.1*  PLT 160 174 173 408   Basic Metabolic Panel:  Recent Labs Lab 11/03/16 2239 11/04/16 0313 11/04/16 0953 11/04/16 1233 11/05/16 0236  NA 136 134* 135 135 135  K 5.8* 6.4* 6.3* 6.2* 6.0*  CL 102 103 104 104 102  CO2 23 23 19* 19* 21*  GLUCOSE 109* 120* 111* 122* 101*  BUN 85* 87* 88* 89* 93*  CREATININE 3.22* 3.35* 3.28* 3.39* 3.53*  CALCIUM 9.2 8.9 9.1 9.1 9.0  MG 2.3  --   --   --   --   PHOS 6.1*  --   --   --   --    Liver Function Tests:  Recent Labs Lab 11/03/16 1640 11/04/16 0313 11/05/16 0236  AST 173* 198* 176*  ALT 90* 101* 109*  ALKPHOS 51 50 52  BILITOT 1.3* 1.0 1.2  PROT 6.7 6.6 6.7  ALBUMIN 3.2* 3.0* 3.0*    Recent Labs Lab 11/03/16 1640  LIPASE 16   Coagulation Profile:  Recent Labs Lab 11/03/16 1327 11/04/16 0953 11/05/16 0236  INR 5.06* 5.41* 4.26*   Cardiac Enzymes:  Recent Labs Lab 11/03/16 2239 11/04/16 0313 11/04/16 0953  TROPONINI 0.28* 0.27* 0.20*   Thyroid Function Tests:  Recent Labs  11/03/16 2239  TSH 2.991   Recent Results (from the past 240 hour(s))  MRSA PCR Screening     Status: Abnormal   Collection Time: 11/04/16 12:39 AM  Result Value Ref Range Status   MRSA by PCR POSITIVE (A) NEGATIVE Final    Comment:        The GeneXpert MRSA Assay (FDA approved for NASAL specimens only), is one component of a comprehensive MRSA colonization surveillance program. It is not intended to diagnose MRSA infection nor to guide  or monitor treatment for MRSA infections. RESULT CALLED TO, READ BACK BY AND VERIFIED WITH: CAITLYN,RN  ON 4N @0241  11/04/16 MKELLY,MLT     Radiology Studies: Ct Chest Wo Contrast  Result Date: 11/04/2016 CLINICAL DATA:  Dyspnea and fatigue for several days.  Hypertension EXAM: CT CHEST WITHOUT CONTRAST TECHNIQUE: Multidetector CT imaging of the chest was performed following the standard protocol without IV contrast. COMPARISON:  Chest radiograph November 03, 2016 FINDINGS: Cardiovascular: There it is no appreciable thoracic aortic aneurysm. There is moderate calcification at the origins of the great vessels. There is atherosclerotic calcification in the aorta. There are multiple foci of coronary artery calcification. Pacemaker lead is attached to the right ventricle. There is cardiomegaly. Pericardium is not appreciably thickened. Main pulmonary outflow tract measures 3.8 cm in diameter. There is calcification in the left ventricular wall anteriorly. Mediastinum/Nodes: There is a focal area of benign-appearing calcification in the left thyroid. Thyroid otherwise appears unremarkable. There is no appreciable thoracic adenopathy. Lungs/Pleura: There is a small pleural effusion on the right. There is bibasilar atelectatic change. There is also slight atelectasis in the posterior segments of each upper lobe. There are a few small scattered bullae in the upper lobes posteriorly. There is no edema or consolidation. On axial slice 64 series 8, there is a 4 mm nodular opacity in the posterior segment of the right upper  lobe. There is mild lower lobe bronchiectatic change bilaterally. Upper Abdomen: There is atherosclerotic calcification in the abdominal aorta. There are cysts arising from each kidney, incompletely visualized. The largest of these cysts is on the left, measuring approximately 5 x 5 cm. Cyst arising from the posterior upper pole of the right kidney measures 3.2 x 3.1 cm. Musculoskeletal: There is  degenerative change throughout the thoracic spine. There are no blastic or lytic bone lesions. IMPRESSION: Small right-sided pleural effusion. Patchy bibasilar atelectasis. No edema or consolidation. There is a 4 mm nodular opacity in the posterior segment of the right upper lobe. No follow-up needed if patient is low-risk. Non-contrast chest CT can be considered in 12 months if patient is high-risk. This recommendation follows the consensus statement: Guidelines for Management of Incidental Pulmonary Nodules Detected on CT Images: From the Fleischner Society 2017; Radiology 2017; 284:228-243. Cardiomegaly. Multiple foci of atherosclerotic calcification including areas of coronary artery calcification. Enlargement of the main pulmonary outflow tract suggests a degree of underlying pulmonary hypertension. Calcification in the left ventricular wall is likely indicative of prior myocardial infarct. No evident adenopathy. Electronically Signed   By: Lowella Grip III M.D.   On: 11/04/2016 10:30   US Renal  Result Date: 11/04/2016 CLINICAL DATA:  Acute renal failure EXAM: RENAL / URINARY TRACT ULTRASOUND COMPLETE COMPARISON:  Abdominal ultrasound 01/25/2006 FINDINGS: Right Kidney: Length: 13.8 cm. 4.3 cm right midpole cyst, 4 cm right upper pole cyst. Echogenicity within normal limits. No mass or hydronephrosis visualized. Left Kidney: Length: 13.8 cm. 7 cm left lower pole cyst. 3 cm and 2 cm central cysts. Echogenicity within normal limits. No mass or hydronephrosis visualized. Bladder: Negative IMPRESSION: Bilateral renal cysts.  No renal obstruction.  Normal renal size. Electronically Signed   By: Franchot Gallo M.D.   On: 11/04/2016 16:39   Dg Chest Port 1 View  Result Date: 11/03/2016 CLINICAL DATA:  Shortness of Breath EXAM: PORTABLE CHEST 1 VIEW COMPARISON:  08/19/2014 FINDINGS: Cardiomegaly is noted. Central mild vascular congestion without convincing pulmonary edema. Elevation of the right  hemidiaphragm again noted. Bilateral basilar atelectasis. Single lead cardiac pacemaker is unchanged in position. No segmental infiltrate. IMPRESSION: Cardiomegaly. Central mild vascular congestion without convincing pulmonary edema. Mild basilar atelectasis. No segmental infiltrate. Electronically Signed   By: Lahoma Crocker M.D.   On: 11/03/2016 16:14   US Abdomen Limited Ruq  Result Date: 11/04/2016 CLINICAL DATA:  Transaminitis EXAM: US ABDOMEN LIMITED - RIGHT UPPER QUADRANT COMPARISON:  Ultrasound 01/25/2006 FINDINGS: Gallbladder: Negative for gallstones. Gallbladder wall thickened 5.2 mm. Negative sonographic Murphy sign. Common bile duct: Diameter: 2.6 mm Liver: Increased echogenicity liver diffusely most compatible with fatty infiltration. No focal liver lesion. No ascites in the right upper quadrant IMPRESSION: Gallbladder wall thickening without gallstones or positive Murphy sign. Possible acute or chronic cholecystitis. Also possible causes of gallbladder wall thickening would include liver failure and heart failure. Echogenic liver compatible with fatty infiltration. Electronically Signed   By: Franchot Gallo M.D.   On: 11/04/2016 16:41   Scheduled Meds: . allopurinol  200 mg Oral Daily  . cephALEXin  250 mg Oral Q8H  . Chlorhexidine Gluconate Cloth  6 each Topical Q0600  . dutasteride  0.5 mg Oral Daily  . furosemide  40 mg Intravenous BID  . insulin aspart  0-9 Units Subcutaneous TID WC  . mouth rinse  15 mL Mouth Rinse BID  . mupirocin ointment  1 application Nasal BID  . sodium chloride flush  3  mL Intravenous Q12H  . sodium polystyrene  15 g Oral Once  . tamsulosin  0.4 mg Oral QPC breakfast   Continuous Infusions:   LOS: 2 days   Time spent: 20 minutes   Faye Ramsay, MD [facility] Pager [phone]  If 7PM-7AM, please contact night-coverage [email or web address] Password [facility] 11/05/2016, 8:31 AM

## 2016-11-05 NOTE — Progress Notes (Signed)
CRITICAL VALUE ALERT  Critical value received: hgb 7.6, potassium 6.0, iNR, 4.3, cr 3.6  Date of notification: 11/05/2016  Time of notification:  0500  Critical value read back yes  Nurse who received alert:  Purcell Nails  MD notified (1st page):  yes  Time of first page:  0515  MD notified (2nd page):  Time of second page:  Responding MD:  Dreama Saa  Time MD responded:  [phone]

## 2016-11-06 ENCOUNTER — Inpatient Hospital Stay (HOSPITAL_COMMUNITY): Payer: Medicare Other

## 2016-11-06 ENCOUNTER — Telehealth: Payer: Self-pay | Admitting: Physician Assistant

## 2016-11-06 DIAGNOSIS — R410 Disorientation, unspecified: Secondary | ICD-10-CM

## 2016-11-06 LAB — BASIC METABOLIC PANEL
ANION GAP: 11 (ref 5–15)
BUN: 107 mg/dL — ABNORMAL HIGH (ref 6–20)
CO2: 21 mmol/L — AB (ref 22–32)
Calcium: 8.6 mg/dL — ABNORMAL LOW (ref 8.9–10.3)
Chloride: 103 mmol/L (ref 101–111)
Creatinine, Ser: 3.5 mg/dL — ABNORMAL HIGH (ref 0.61–1.24)
GFR calc Af Amer: 17 mL/min — ABNORMAL LOW (ref >60)
GFR, EST NON AFRICAN AMERICAN: 15 mL/min — AB (ref >60)
GLUCOSE: 119 mg/dL — AB (ref 65–99)
POTASSIUM: 5.6 mmol/L — AB (ref 3.5–5.1)
Sodium: 135 mmol/L (ref 135–145)

## 2016-11-06 LAB — COMPREHENSIVE METABOLIC PANEL
ALK PHOS: 51 U/L (ref 38–126)
ALT: 222 U/L — AB (ref 17–63)
ANION GAP: 9 (ref 5–15)
AST: 433 U/L — ABNORMAL HIGH (ref 15–41)
Albumin: 3.1 g/dL — ABNORMAL LOW (ref 3.5–5.0)
BILIRUBIN TOTAL: 1.7 mg/dL — AB (ref 0.3–1.2)
BUN: 104 mg/dL — AB (ref 6–20)
CHLORIDE: 103 mmol/L (ref 101–111)
CO2: 23 mmol/L (ref 22–32)
CREATININE: 4.01 mg/dL — AB (ref 0.61–1.24)
Calcium: 8.8 mg/dL — ABNORMAL LOW (ref 8.9–10.3)
GFR, EST AFRICAN AMERICAN: 15 mL/min — AB (ref >60)
GFR, EST NON AFRICAN AMERICAN: 13 mL/min — AB (ref >60)
GLUCOSE: 103 mg/dL — AB (ref 65–99)
Potassium: 5.5 mmol/L — ABNORMAL HIGH (ref 3.5–5.1)
SODIUM: 135 mmol/L (ref 135–145)
Total Protein: 6.5 g/dL (ref 6.5–8.1)

## 2016-11-06 LAB — BLOOD GAS, ARTERIAL
ACID-BASE DEFICIT: 2.3 mmol/L — AB (ref 0.0–2.0)
Bicarbonate: 22.8 mmol/L (ref 20.0–28.0)
Drawn by: 236041
O2 CONTENT: 2 L/min
O2 SAT: 96.6 %
PCO2 ART: 44.7 mmHg (ref 32.0–48.0)
PO2 ART: 92.8 mmHg (ref 83.0–108.0)
Patient temperature: 98.6
pH, Arterial: 7.327 — ABNORMAL LOW (ref 7.350–7.450)

## 2016-11-06 LAB — GLUCOSE, CAPILLARY
Glucose-Capillary: 95 mg/dL (ref 65–99)
Glucose-Capillary: 142 mg/dL — ABNORMAL HIGH (ref 65–99)
Glucose-Capillary: 138 mg/dL — ABNORMAL HIGH (ref 65–99)
Glucose-Capillary: 111 mg/dL — ABNORMAL HIGH (ref 65–99)

## 2016-11-06 LAB — HEPATITIS PANEL, ACUTE
HEP B S AG: NEGATIVE
Hep A IgM: NEGATIVE
Hep B C IgM: NEGATIVE

## 2016-11-06 LAB — CBC
HCT: 24 % — ABNORMAL LOW (ref 39.0–52.0)
Hemoglobin: 7.5 g/dL — ABNORMAL LOW (ref 13.0–17.0)
MCH: 31.6 pg (ref 26.0–34.0)
MCHC: 31.3 g/dL (ref 30.0–36.0)
MCV: 101.3 fL — ABNORMAL HIGH (ref 78.0–100.0)
Platelets: 182 10*3/uL (ref 150–400)
RBC: 2.37 MIL/uL — ABNORMAL LOW (ref 4.22–5.81)
RDW: 17 % — ABNORMAL HIGH (ref 11.5–15.5)
WBC: 7.5 10*3/uL (ref 4.0–10.5)

## 2016-11-06 LAB — PROTIME-INR
INR: 2.81
PROTHROMBIN TIME: 30.2 s — AB (ref 11.4–15.2)

## 2016-11-06 LAB — AMMONIA: Ammonia: 58 umol/L — ABNORMAL HIGH (ref 9–35)

## 2016-11-06 MED ORDER — SODIUM CHLORIDE 0.9 % IV SOLN
510.0000 mg | INTRAVENOUS | Status: DC
  Administered 2016-11-06: 510 mg via INTRAVENOUS
  Filled 2016-11-06: qty 17

## 2016-11-06 MED ORDER — MIRTAZAPINE 15 MG PO TABS
15.0000 mg | ORAL_TABLET | Freq: Every evening | ORAL | Status: DC | PRN
  Administered 2016-11-08: 15 mg via ORAL
  Filled 2016-11-06 (×4): qty 1

## 2016-11-06 MED ORDER — TRAMADOL HCL 50 MG PO TABS
50.0000 mg | ORAL_TABLET | Freq: Four times a day (QID) | ORAL | Status: DC | PRN
  Administered 2016-11-06: 50 mg via ORAL
  Filled 2016-11-06: qty 1

## 2016-11-06 MED ORDER — ESCITALOPRAM OXALATE 10 MG PO TABS: 20.0000 mg | ORAL_TABLET | Freq: Every day | ORAL | Status: DC

## 2016-11-06 MED ORDER — MIRTAZAPINE 15 MG PO TABS: 15.0000 mg | ORAL_TABLET | Freq: Every day | ORAL | Status: DC

## 2016-11-06 MED ORDER — SODIUM POLYSTYRENE SULFONATE 15 GM/60ML PO SUSP
15.0000 g | Freq: Once | ORAL | Status: AC
Start: 2016-11-06 — End: 2016-11-06
  Administered 2016-11-06: 15 g via ORAL
  Filled 2016-11-06: qty 60

## 2016-11-06 MED ORDER — ROPINIROLE HCL 1 MG PO TABS
3.0000 mg | ORAL_TABLET | Freq: Every day | ORAL | Status: DC
  Administered 2016-11-06: 3 mg via ORAL
  Filled 2016-11-06: qty 3

## 2016-11-06 MED ORDER — MIRTAZAPINE 15 MG PO TABS: 15.0000 mg | ORAL_TABLET | Freq: Two times a day (BID) | ORAL | Status: DC | PRN

## 2016-11-06 NOTE — Progress Notes (Signed)
[facility] Rounding Note  Subjective/Interval History:  Delirious this AM Had some hallucinosis and confusion Very difficult to focus him right now (talking about lines of nurses, bugs crawling I/O and weights don't match Subjectively feeling better breathing wise (if history is to be believed) despite what does NOT look like a good diuretic response  Objective Vital signs in last 24 hours: Vitals:   11/06/16 0342 11/06/16 0701 11/06/16 0800 11/06/16 1100  BP: (!) 117/53 120/60  (!) 117/93  Pulse: (!) 59 (!) 58 (!) 59 (!) 58  Resp: 14 14 14 15   Temp: 97.9 F (36.6 C)   98.6 F (37 C)  TempSrc: Oral   Oral  SpO2: 96% 100% 98% (!) 88%  Weight: 116.6 kg (257 lb)     Height:       Weight change: 3.175 kg (7 lb)  Intake/Output Summary (Last 24 hours) at 11/06/16 1317 Last data filed at 11/06/16 0300  Gross per 24 hour  Intake                0 ml  Output              450 ml  Net             -450 ml   Physical Exam:  Blood pressure (!) 117/93, pulse (!) 58, temperature 98.6 F (37 C), temperature source Oral, resp. rate 15, height 5\' 7"  (1.702 m), weight 116.6 kg (257 lb), SpO2 (!) 88 %.  Pt up in the chair, restless, can't get him to focus on why I am in to see him + JVD Lungs are clearer at the bases Irregularly irregular rhythm and no rub. Ht sounds distant Obese NT abd Edema 1+ edema on right lower extremity, trace+ on left RLE wrapped with ACE wrap    Recent Labs Lab 11/03/16 2239 11/04/16 0313 11/04/16 0953 11/04/16 1233 11/05/16 0236 11/05/16 1206 11/06/16 0209  NA 136 134* 135 135 135 136 135  K 5.8* 6.4* 6.3* 6.2* 6.0* 5.8* 5.5*  CL 102 103 104 104 102 102 103  CO2 23 23 19* 19* 21* 24 23  GLUCOSE 109* 120* 111* 122* 101* 103* 103*  BUN 85* 87* 88* 89* 93* 98* 104*  CREATININE 3.22* 3.35* 3.28* 3.39* 3.53* 3.80* 4.01*  CALCIUM 9.2 8.9 9.1 9.1 9.0 8.7* 8.8*  PHOS 6.1*  --   --   --   --   --   --    Liver Function Tests:  Recent Labs Lab 11/04/16 0313  11/05/16 0236 11/06/16 0209  AST 198* 176* 433*  ALT 101* 109* 222*  ALKPHOS 50 52 51  BILITOT 1.0 1.2 1.7*  PROT 6.6 6.7 6.5  ALBUMIN 3.0* 3.0* 3.1*    Recent Labs Lab 11/03/16 1640  LIPASE 16     Recent Labs Lab 11/03/16 2239 11/04/16 0313 11/05/16 0236 11/06/16 0209  WBC 4.7 4.9 6.6 7.5  HGB 7.4* 7.6* 7.6* 7.5*  HCT 23.3* 24.5* 24.0* 24.0*  MCV 102.2* 102.9* 102.1* 101.3*  PLT 174 173 181 182    Recent Labs Lab 11/03/16 2239 11/04/16 0313 11/04/16 0953  TROPONINI 0.28* 0.27* 0.20*     Recent Labs Lab 11/05/16 1150 11/05/16 1649 11/05/16 2120 11/06/16 0806 11/06/16 1131  GLUCAP 120* 98 110* 95 142*       Recent Labs Lab 11/04/16 0953  IRON 23*  TIBC 532*  FERRITIN 49   Studies/Results: Ct Head Wo Contrast  Result Date: 11/06/2016 CLINICAL DATA:  Confusion. EXAM: CT HEAD WITHOUT CONTRAST TECHNIQUE: Contiguous axial images were obtained from the base of the skull through the vertex without intravenous contrast. COMPARISON:  August 20, 2014 FINDINGS: Brain: No subdural, epidural, or subarachnoid hemorrhage. Low-attenuation in the left cerebellar hemisphere on series 3, image 9 is identified. There is streak artifact in this region. There is no associated mass effect on the fourth ventricle. The cerebellum, brainstem, and basal cisterns are otherwise normal. Ventricles and sulci are mildly prominent but stable. White matter changes are moderate and similar in the interval. A lacunar infarct in the left basal ganglia is unchanged. Low-attenuation through the left temporal lobe is thought to be due to streak artifact. No other sites of ischemia or infarct are identified. No mass, mass effect, or midline shift. Vascular: Calcified atherosclerosis is seen in the intracranial carotid arteries. Skull: Normal. Negative for fracture or focal lesion. Sinuses/Orbits: No acute finding. Other: None. IMPRESSION: 1. There is low-attenuation in the left cerebellar  hemisphere. The finding could be at least partially artifactual, due to streak artifact and beam hardening. However, an age-indeterminate infarct is not excluded on this study. Recommend clinical correlation. An MRI could further assess if clinically warranted. 2. No bleed identified.  No other acute abnormalities. These results will be called to the ordering clinician or representative by the Radiologist Assistant, and communication documented in the PACS or zVision Dashboard. Electronically Signed   By: Dorise Bullion III M.D   On: 11/06/2016 09:01   US Renal  Result Date: 11/04/2016 CLINICAL DATA:  Acute renal failure EXAM: RENAL / URINARY TRACT ULTRASOUND COMPLETE COMPARISON:  Abdominal ultrasound 01/25/2006 FINDINGS: Right Kidney: Length: 13.8 cm. 4.3 cm right midpole cyst, 4 cm right upper pole cyst. Echogenicity within normal limits. No mass or hydronephrosis visualized. Left Kidney: Length: 13.8 cm. 7 cm left lower pole cyst. 3 cm and 2 cm central cysts. Echogenicity within normal limits. No mass or hydronephrosis visualized. Bladder: Negative IMPRESSION: Bilateral renal cysts.  No renal obstruction.  Normal renal size. Electronically Signed   By: Franchot Gallo M.D.   On: 11/04/2016 16:39   US Abdomen Limited Ruq  Result Date: 11/04/2016 CLINICAL DATA:  Transaminitis EXAM: US ABDOMEN LIMITED - RIGHT UPPER QUADRANT COMPARISON:  Ultrasound 01/25/2006 FINDINGS: Gallbladder: Negative for gallstones. Gallbladder wall thickened 5.2 mm. Negative sonographic Murphy sign. Common bile duct: Diameter: 2.6 mm Liver: Increased echogenicity liver diffusely most compatible with fatty infiltration. No focal liver lesion. No ascites in the right upper quadrant IMPRESSION: Gallbladder wall thickening without gallstones or positive Murphy sign. Possible acute or chronic cholecystitis. Also possible causes of gallbladder wall thickening would include liver failure and heart failure. Echogenic liver compatible with  fatty infiltration. Electronically Signed   By: Franchot Gallo M.D.   On: 11/04/2016 16:41   Medications:  . allopurinol  200 mg Oral Daily  . cephALEXin  250 mg Oral Q8H  . Chlorhexidine Gluconate Cloth  6 each Topical Q0600  . dutasteride  0.5 mg Oral Daily  . furosemide  80 mg Intravenous BID  . insulin aspart  0-9 Units Subcutaneous TID WC  . mouth rinse  15 mL Mouth Rinse BID  . mupirocin ointment  1 application Nasal BID  . patiromer  8.4 g Oral Daily  . rOPINIRole  3 mg Oral Daily  . sodium chloride flush  3 mL Intravenous Q12H  . tamsulosin  0.4 mg Oral QPC breakfast   Background PMH HTN, CAD, chronic A fib on coumadin, OSA on CPAP,  pulmonary HTN, nonischemic CM (EF 55-60%) s/p pacemaker, obesity, gout, and BPH. Presented to PCP's office on 11/03/16 c/o dyspnea, increasing LE edema, generalized weakness, episodes of bleeding from varicose veins. Sent to the [facility] -> bradycardia, hypoxia, anemia (Hgb of 7.6), hyperkalemia, supratherapeutic coumadin with INR of 5.06, and AKI with Scr of 3.35.   Last available creatinine in EPIC labs was from 08/2014 and was 1.3 at that time.  Dr. [name] notes a creatinine from [facility] from 10/05/16 of 1.5. Was on irbesartan and potassium supplements PTA ->held on admission. Rec'd one dose of Bactrim but this was stopped.    Assessment/Plan: 1. AKI - in setting of decompensated R CHF, ABLA, concomitant ARB therapy. Had bactrim which was stopped.  Likely ischemic ATN.  Continue to hold ARB and KCl. Creatinine trending up.  2. Hyperkalemia- due to #1 and K supplements with ARB therapy. Persistent after 3 doses kayexalate. Added patiromer 3/30 - continue until K normalizes. Changed diet to renal (to get K restriction)` 3. Acute on chronic diastolic CHF- 8lb weight gain outpt with volume overload. Poor response to lasix 40 BID - cards  increased to 80 BID. Symptoms better, not much UOP. ECHO shows stable LVF, signs of R heart dysfx.  4. ABLA -  related to [place] and varicose veins.  He might benefit from transfusion (normalize K first). TSat only 4%. Will dose Feraheme. 5. Supra-therapeutic INR-  Coumadin on hold. INR coming down 6. CAD with nonischemic CMP.  ^ troponin, per Cardiology 7. Abnormal LFT's Hold allopurinol and other meds.  Abdominal US revealed possible fatty liver.  Also had thickening of gallbladder wall, w/u per primary svc.  Follow LFT's.   Jamal Maes, MD [facility] [phone] pager 11/06/2016, 1:17 PM

## 2016-11-06 NOTE — Progress Notes (Signed)
Pt walked in hallway with 2 RNs, used walker and 2nd RN pushed chair behind pt, walked aprox 300-500 ft with mini stopping in hallway  Pt wife insisted pt cont to walk and became verbally aggressive throughout walk with nursing staff, pt encouraged to communicate desire/need to complete walk and return to chair.  Nursing will cont to monitor

## 2016-11-06 NOTE — Telephone Encounter (Signed)
Received outpatient page that patient is distraught and wants to go home. Pt currently admitted. Dr. Debara Pickett just saw him a short while ago. Relayed information to Dr. Debara Pickett who said he did speak with him. Called # back and got vm, LMOM to call back if needed. Dayna Dunn PA-C

## 2016-11-06 NOTE — Progress Notes (Signed)
ANTICOAGULATION CONSULT NOTE - Initial Consult  Pharmacy Consult for coumadin Indication: atrial fibrillation  Allergies  Allergen Reactions  . Vicodin [Hydrocodone-Acetaminophen] Nausea And Vomiting  . Hydrocodone Nausea Only   Patient Measurements: Height: 5\' 7"  (170.2 cm) Weight: 257 lb (116.6 kg) IBW/kg (Calculated) : 66.1  Vital Signs: Temp: 98.6 F (37 C) (03/31 1100) Temp Source: Oral (03/31 1100) BP: 117/93 (03/31 1100) Pulse Rate: 58 (03/31 1100)  Labs:  Recent Labs  11/03/16 2239 11/04/16 0313 11/04/16 0953  11/05/16 0236 11/05/16 1206 11/06/16 0209  HGB 7.4* 7.6*  --   --  7.6*  --  7.5*  HCT 23.3* 24.5*  --   --  24.0*  --  24.0*  PLT 174 173  --   --  181  --  182  LABPROT  --   --  49.3*  --  42.0*  --  30.2*  INR  --   --  5.41*  --  4.26*  --  2.81  CREATININE 3.22* 3.35* 3.28*  < > 3.53* 3.80* 4.01*  TROPONINI 0.28* 0.27* 0.20*  --   --   --   --   < > = values in this interval not displayed.  Estimated Creatinine Clearance: 17 mL/min (A) (by C-G formula based on SCr of 4.01 mg/dL (H)).  Medical History: Past Medical History:  Diagnosis Date  . Coronary artery disease   . Diverticulitis   . Gout   . Hyperlipidemia   . MI (myocardial infarction)   . Nonischemic cardiomyopathy (HCC)    mild  . OSA on CPAP   . Permanent atrial fibrillation ([place])   . Pulmonary hypertension    Medications:  Facility-Administered Medications Prior to Admission  Medication Dose Route Frequency Provider Last Rate Last Dose  . betamethasone acetate-betamethasone sodium phosphate (CELESTONE) injection 12 mg  12 mg Intramuscular Once Edrick Kins, DPM       Prescriptions Prior to Admission  Medication Sig Dispense Refill Last Dose  . acetaminophen (TYLENOL) 650 MG CR tablet Take 650 mg by mouth every 8 (eight) hours as needed for pain.   Past Month at Unknown time  . allopurinol (ZYLOPRIM) 300 MG tablet Take 300 mg by mouth daily.   11/02/2016 at Unknown time  .  cephALEXin (KEFLEX) 500 MG capsule Take 500 mg by mouth 3 (three) times daily.   11/02/2016 at Unknown time  . COLCRYS 0.6 MG tablet Take 0.6 mg by mouth as needed (flareups).    Past Month at Unknown time  . Docusate Calcium (STOOL SOFTENER PO) Take 2 tablets by mouth at bedtime.    11/02/2016 at Unknown time  . dutasteride (AVODART) 0.5 MG capsule Take 0.5 mg by mouth daily.   11/02/2016 at Unknown time  . fenofibrate 160 MG tablet Take 160 mg by mouth daily.   11/02/2016 at Unknown time  . furosemide (LASIX) 20 MG tablet Take 40 mg by mouth daily. swelling   11/02/2016 at Unknown time  . irbesartan (AVAPRO) 150 MG tablet Take 150 mg by mouth daily.   11/02/2016 at Unknown time  . loratadine (CLARITIN) 10 MG tablet Take 10 mg by mouth daily.   11/02/2016 at Unknown time  . mirtazapine (REMERON) 15 MG tablet Take 15 mg by mouth at bedtime.   11/02/2016 at Unknown time  . Multiple Vitamins-Minerals (PRESERVISION AREDS 2) CAPS Take 1 capsule by mouth daily.   11/02/2016 at Unknown time  . nitroGLYCERIN (NITROSTAT) 0.4 MG SL tablet Place 0.4 mg  under the tongue every 5 (five) minutes as needed. Chest pain   prn  . Omega-3 Fatty Acids (OMEGA-3 PO) Take 1 capsule by mouth 2 (two) times daily.   11/02/2016 at Unknown time  . potassium chloride SA (K-DUR,KLOR-CON) 20 MEQ tablet Take 40 mEq by mouth daily.    11/02/2016 at Unknown time  . rOPINIRole (REQUIP) 3 MG tablet Take 1 tablet by mouth daily. Take 1 tab daily   11/02/2016 at Unknown time  . Tamsulosin HCl (FLOMAX) 0.4 MG CAPS Take 0.4 mg by mouth daily after breakfast.   11/02/2016 at Unknown time  . traMADol (ULTRAM) 50 MG tablet Take 50 mg by mouth 4 (four) times daily. For pain    11/02/2016 at Unknown time  . warfarin (COUMADIN) 5 MG tablet Take 1 tablet by mouth daily. Take 1 tab daily except Tuesday take half tab   11/02/2016 at Unknown time   Scheduled:  . allopurinol  200 mg Oral Daily  . cephALEXin  250 mg Oral Q8H  . Chlorhexidine Gluconate Cloth  6  each Topical Q0600  . dutasteride  0.5 mg Oral Daily  . furosemide  80 mg Intravenous BID  . insulin aspart  0-9 Units Subcutaneous TID WC  . mouth rinse  15 mL Mouth Rinse BID  . mupirocin ointment  1 application Nasal BID  . patiromer  8.4 g Oral Daily  . sodium chloride flush  3 mL Intravenous Q12H  . tamsulosin  0.4 mg Oral QPC breakfast   Assessment: 83 yo male here with hyperkalemia in setting of AoCKD stage 3. He has history of afib on coumadin PTA and pharmacy consulted to dose. Vitamin K 2.5mg  given on 3/29. INR trending down as patient has not received warfarin dose in over 4 day. FOBT positive, hgb stable at 7.5. MD would like to hold off on resuming warfarin today and has placed GI consult.   INR today: 2.81  Home coumadin dose: 5mg /day except take 2.5mg  on Tu  Goal of Therapy:  INR 2-3 Monitor platelets by anticoagulation protocol: Yes   Plan:  -Hold coumadin per MD -Follow for further bleeding -Daily PT/INR  Georga Bora, PharmD Clinical Pharmacist Pager: [phone] 11/06/2016 11:40 AM
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
## 2016-11-06 NOTE — Progress Notes (Signed)
Progress Note  Patient Name: [name] Date of Encounter: 11/06/2016  Primary Cardiologist: Kelly/ Croitoru  Subjective   Breathing has improved overnight. Diuretics increased yesterday - recorded 530 cc negative. Weight is recorded up 7 lbs since 3/30- not considered accurate. Creatinine is trending up - nephrology is following.  Inpatient Medications    Scheduled Meds: . allopurinol  200 mg Oral Daily  . cephALEXin  250 mg Oral Q8H  . Chlorhexidine Gluconate Cloth  6 each Topical Q0600  . dutasteride  0.5 mg Oral Daily  . furosemide  80 mg Intravenous BID  . insulin aspart  0-9 Units Subcutaneous TID WC  . mouth rinse  15 mL Mouth Rinse BID  . mupirocin ointment  1 application Nasal BID  . patiromer  8.4 g Oral Daily  . rOPINIRole  3 mg Oral Daily  . sodium chloride flush  3 mL Intravenous Q12H  . tamsulosin  0.4 mg Oral QPC breakfast   Continuous Infusions:  PRN Meds: albuterol, bisacodyl, guaiFENesin, mirtazapine, oxyCODONE-acetaminophen   Vital Signs    Vitals:   11/06/16 0342 11/06/16 0701 11/06/16 0800 11/06/16 1100  BP: (!) 117/53 120/60  (!) 117/93  Pulse: (!) 59 (!) 58 (!) 59 (!) 58  Resp: 14 14 14 15   Temp: 97.9 F (36.6 C)   98.6 F (37 C)  TempSrc: Oral   Oral  SpO2: 96% 100% 98% (!) 88%  Weight: 257 lb (116.6 kg)     Height:        Intake/Output Summary (Last 24 hours) at 11/06/16 1202 Last data filed at 11/06/16 0300  Gross per 24 hour  Intake                0 ml  Output              450 ml  Net             -450 ml   Filed Weights   11/04/16 0412 11/05/16 0500 11/06/16 0342  Weight: 248 lb 9.6 oz (112.8 kg) 250 lb (113.4 kg) 257 lb (116.6 kg)    Telemetry    V paced - Personally Reviewed  Physical Exam  Comfortable sitting in chair GEN: No acute distress.   Neck: No JVD Cardiac: RRR, split S2, no murmurs, rubs, or gallops. 2+ edema. Respiratory: Clear to auscultation bilaterally. GI: Soft, nontender, non-distended  MS: No  edema; No deformity. Neuro:  Nonfocal  Psych: Normal affect   Labs    Chemistry Recent Labs Lab 11/04/16 0313  11/05/16 0236 11/05/16 1206 11/06/16 0209  NA 134*  < > 135 136 135  K 6.4*  < > 6.0* 5.8* 5.5*  CL 103  < > 102 102 103  CO2 23  < > 21* 24 23  GLUCOSE 120*  < > 101* 103* 103*  BUN 87*  < > 93* 98* 104*  CREATININE 3.35*  < > 3.53* 3.80* 4.01*  CALCIUM 8.9  < > 9.0 8.7* 8.8*  PROT 6.6  --  6.7  --  6.5  ALBUMIN 3.0*  --  3.0*  --  3.1*  AST 198*  --  176*  --  433*  ALT 101*  --  109*  --  222*  ALKPHOS 50  --  52  --  51  BILITOT 1.0  --  1.2  --  1.7*  GFRNONAA 16*  < > 15* 13* 13*  GFRAA 18*  < > 17* 16* 15*  ANIONGAP 8  < > 12 10 9   < > = values in this interval not displayed.   Hematology  Recent Labs Lab 11/04/16 0313 11/04/16 0953 11/05/16 0236 11/06/16 0209  WBC 4.9  --  6.6 7.5  RBC 2.38* 2.50* 2.35* 2.37*  HGB 7.6*  --  7.6* 7.5*  HCT 24.5*  --  24.0* 24.0*  MCV 102.9*  --  102.1* 101.3*  MCH 31.9  --  32.3 31.6  MCHC 31.0  --  31.7 31.3  RDW 17.1*  --  17.2* 17.0*  PLT 173  --  181 182    Cardiac Enzymes  Recent Labs Lab 11/03/16 2239 11/04/16 0313 11/04/16 0953  TROPONINI 0.28* 0.27* 0.20*     Recent Labs Lab 11/03/16 1358 11/03/16 1732  TROPIPOC 0.11* 0.13*     BNP  Recent Labs Lab 11/04/16 1532  BNP 1,048.9*     DDimer No results for input(s): DDIMER in the last 168 hours.   Radiology    Ct Head Wo Contrast  Result Date: 11/06/2016 CLINICAL DATA:  Confusion. EXAM: CT HEAD WITHOUT CONTRAST TECHNIQUE: Contiguous axial images were obtained from the base of the skull through the vertex without intravenous contrast. COMPARISON:  August 20, 2014 FINDINGS: Brain: No subdural, epidural, or subarachnoid hemorrhage. Low-attenuation in the left cerebellar hemisphere on series 3, image 9 is identified. There is streak artifact in this region. There is no associated mass effect on the fourth ventricle. The cerebellum,  brainstem, and basal cisterns are otherwise normal. Ventricles and sulci are mildly prominent but stable. White matter changes are moderate and similar in the interval. A lacunar infarct in the left basal ganglia is unchanged. Low-attenuation through the left temporal lobe is thought to be due to streak artifact. No other sites of ischemia or infarct are identified. No mass, mass effect, or midline shift. Vascular: Calcified atherosclerosis is seen in the intracranial carotid arteries. Skull: Normal. Negative for fracture or focal lesion. Sinuses/Orbits: No acute finding. Other: None. IMPRESSION: 1. There is low-attenuation in the left cerebellar hemisphere. The finding could be at least partially artifactual, due to streak artifact and beam hardening. However, an age-indeterminate infarct is not excluded on this study. Recommend clinical correlation. An MRI could further assess if clinically warranted. 2. No bleed identified.  No other acute abnormalities. These results will be called to the ordering clinician or representative by the Radiologist Assistant, and communication documented in the PACS or zVision Dashboard. Electronically Signed   By: Dorise Bullion III M.D   On: 11/06/2016 09:01   US Renal  Result Date: 11/04/2016 CLINICAL DATA:  Acute renal failure EXAM: RENAL / URINARY TRACT ULTRASOUND COMPLETE COMPARISON:  Abdominal ultrasound 01/25/2006 FINDINGS: Right Kidney: Length: 13.8 cm. 4.3 cm right midpole cyst, 4 cm right upper pole cyst. Echogenicity within normal limits. No mass or hydronephrosis visualized. Left Kidney: Length: 13.8 cm. 7 cm left lower pole cyst. 3 cm and 2 cm central cysts. Echogenicity within normal limits. No mass or hydronephrosis visualized. Bladder: Negative IMPRESSION: Bilateral renal cysts.  No renal obstruction.  Normal renal size. Electronically Signed   By: Franchot Gallo M.D.   On: 11/04/2016 16:39   US Abdomen Limited Ruq  Result Date: 11/04/2016 CLINICAL DATA:   Transaminitis EXAM: US ABDOMEN LIMITED - RIGHT UPPER QUADRANT COMPARISON:  Ultrasound 01/25/2006 FINDINGS: Gallbladder: Negative for gallstones. Gallbladder wall thickened 5.2 mm. Negative sonographic Murphy sign. Common bile duct: Diameter: 2.6 mm Liver: Increased echogenicity liver diffusely most compatible with fatty  infiltration. No focal liver lesion. No ascites in the right upper quadrant IMPRESSION: Gallbladder wall thickening without gallstones or positive Murphy sign. Possible acute or chronic cholecystitis. Also possible causes of gallbladder wall thickening would include liver failure and heart failure. Echogenic liver compatible with fatty infiltration. Electronically Signed   By: Franchot Gallo M.D.   On: 11/04/2016 16:41    Cardiac Studies   Echo 11/05/16  - Left ventricle: The cavity size was moderately dilated. There was   moderate concentric hypertrophy. Systolic function was normal.   The estimated ejection fraction was in the range of 50% to 55%.   Wall motion was normal; there were no regional wall motion   abnormalities. Doppler parameters are consistent with restrictive   physiology, indicative of decreased left ventricular diastolic   compliance and/or increased left atrial pressure. Doppler   parameters are consistent with elevated ventricular end-diastolic   filling pressure. - Ventricular septum: Septal motion showed paradox. - Aortic valve: Trileaflet; moderately thickened, moderately   calcified leaflets. Valve mobility was restricted. Transvalvular   velocity was within the normal range. There was no stenosis.   There was mild regurgitation. - Mitral valve: Calcified annulus. Mildly thickened leaflets .   There was mild regurgitation. - Left atrium: The atrium was severely dilated. - Right ventricle: The cavity size was moderately dilated. Wall   thickness was normal. Systolic function was mildly reduced. - Right atrium: The atrium was severely dilated. Pacer  wire or   catheter noted in right atrium. - Tricuspid valve: There was moderate-severe regurgitation. - Pulmonic valve: There was mild regurgitation. - Pulmonary arteries: Systolic pressure was moderately increased.   PA peak pressure: 43 mm Hg (S). - Inferior vena cava: The vessel was dilated. The respirophasic   diameter changes were blunted (< 50%), consistent with elevated   central venous pressure.  Impressions:  - No significant difference since the prior study on 09/12/2014.  Patient Profile     83 y.o. male with chronic diastolic heart failure, OSA on CPAP, chronic atrial fibrillation, CAD presents with volume overload, coagulopathy, acute renal insufficiency, hyperkalemia with uncertain cause (recent Bactrim Rx).  Assessment & Plan    1. CHF:  No change in LV function by echo, same signs of RV dysfunction due to cor pulmonale. Looks like he needs higher dose diuretics in the setting of renal failure - creatinine continues to decline. Appreciate nephrology recommendations. 2. AFib: permanent, slow response with high prevalence of V pacing even without rate control meds, unchanged.  3. PPM: mostly V paced, but this is not new. 4. Coagulopathy: presumably explained by passive liver congestion. [place] raised possibility of fatty liver and cholecystitis, but these changes might be explained by volume overload. 5. Acute renal insuff:  Trimethoprim related (note marked and persistent hyperkalemia). No obstruction on [place]. 6. CAD: no angina. Troponin elevation is mild and unchanging, not consistent with acute coronary sd.  Pixie Casino, MD, [facility] Attending Cardiologist [facility]  Pixie Casino, MD  11/06/2016, 12:02 PM

## 2016-11-06 NOTE — Progress Notes (Signed)
Patient had episode of severe confusion this am. RN wake up patient to take meds. Patient start acting abnormal behavior to the point crying and appears very scared. Trying to orient patient but continue to moan and wine like a child. Patient open eyes and start dosing off again to sleep. Finally woke up disoriented, able to sit at the side of the bed and talking with a change voice high pitch sound. Patient scared and said he had a bad dream. Oriented to person but disoriented to time and dates. Previous RN stated patient is oriented last night, O2 saturation RA was 88% and placed on O2 2 liters. (sats 96-100). Dr. Myna Hidalgo on call was paged orders  Made.. Will do blood work and CT scan this am. Patient appears to be slightly back to baseline. Will continue to monitor.

## 2016-11-06 NOTE — Progress Notes (Addendum)
Patient ID: [name], male   DOB: 1932/07/02, 83 y.o.   MRN: [id number]    PROGRESS NOTE  [name]  PJK:[id number] DOB: 09/30/1931 DOA: 11/03/2016  PCP: [name], MD   Brief Narrative:  a 83 y.o. male with a-fib on coumadin, MI, non ischemic cardiomyopathy, s/p pacemaker, HTN, BPH (chronic urinary retention and taking Dutasteride and Tamsulosin), presented earlier to PCP office for dyspnea and was referred to ED due to multiple blood work abnormalities. Pt reports ongoing dyspnea for several days, poor oral intake and fatigue. No active chest pain, no urinary concerns. Pt explains he has had some "heaviness in the stomach" and felt full and was not able to eat. He does have varicose veins RLE and has ACE wrap but says earlier in the day he had lots of bleeding from the varicose veins, 6 episodes of significant blood loss, unable to quantify. Pt reports having recent scalp abscess that was lanced and tested + for staph, he has been on antibiotic for it.   ED Course: In ED, pt noted to be hemodynamically stable, VS notable for HR in 50-60's, initial oxygen saturation 88% on RA and pt placed on oxygen via [place], O2 sat up to 100%. Blood work notable for Hg 7.6, K 6 --> 6.3, Cr 3.35 --> 3.36. INR 5. Pt give dose of calcium gluconate and TRH asked to admit to step down unit due to severe electrolyte derangements.   Assessment & Plan:  Principal Problem:   ARF (acute renal failure) ([place]) imposed on CKD stage III - imposed on CKD stage III, GFR in 50's with stable Cr in January 2016, last report in 09/2016 Cr was 1.5  - likely multifactorial in etiology, pre renal --> ATN in the setting of bleeding, lasix use, losartan use, poor oral intake  - ARB's still on hold, Lasix done increased from 40 mg --> 80 IV BID started 3/29 - Cr is now trending up from 3.39 --> 3.53 --> 4.01 - this is the weight trend in the past 48 hours  Filed Weights   11/04/16 0412 11/05/16 0500 11/06/16 0342    Weight: 112.8 kg (248 lb 9.6 oz) 113.4 kg (250 lb) 116.6 kg (257 lb)  - renal US with no evidence of hydronephrosis  - appreciate nephrology team assistance   Active Problems:   Hyperkalemia, hyperphosphatemia  - gave two doses of Ca gluconate in ED - also has gotten kayexalate x 2 on 3/29, also given one dose this AM 3/30, will give one more dose 15 gm this AM 3/31 - K trend: 5.7 --> 5.8 --> 6.4 --> 6 --> 5.5, this AM  - pt has been on K supplementation at home but this has been held since admission - repeat BMP this afternoon     Dyspnea - from acute blood loss anemia, component of pulmonary vascular congestion  - Hg has remained at 7.4 - 7.6 with no signs of active bleeding at this time  - keep on oxygen if needed to maintain O2 saturations > 90%    Acute blood loss anemia - in pt on coumadin, had some bleeding from varicose veins, RLE but not sure if that is the only source  - coumadin was held since admission, INR trending down: 5 --> 5.41 --> 4.26 --> 2.81 - FOBT +, anemia panel with low iron  - pt has gotten one dose of Vit K 2.5 mg (3/29) - pt did have EGD in 2016 done by  Dr. Watt Climes, noted few small AVM - will need to discuss with GI team further recommendations     Acute on Chronic diastolic heart failure ([place]) - last ECHO in 2016 with stable EF - paced rhythm - monitor daily weights, strict I/O - ECHO pending  - pt started on Lasix 40 mg IV BID, increased the dose to 80 mg IV BID on 3/30 - weight trend since admission  Filed Weights   11/04/16 0412 11/05/16 0500 11/06/16 0342  Weight: 112.8 kg (248 lb 9.6 oz) 113.4 kg (250 lb) 116.6 kg (257 lb)     Transaminitis  - ? Congestive etiology  - monitor for now  - RUQ [place] with gallbladder wall thickening can be explained by congestive etiology     RLE wound, varicose vein, venous stasis ulcer  - wound care consulted for assistance  - overall stable, healing     Atrial fibrillation, permanent, slow VR, on chronic  anticoagulation    Pacemaker single chamber, [employer], 2013   Elevated troponin, demand ischemia in the setting of ARF  - INR supra therapeutic on admission and coumadin has been held - INR overall trending down and is at target range this AM  - needs correction of K, kayexalate given again this AM - cardiology assistance appreciate     BPH (benign prostatic hyperplasia) - resumed home medical regimen Tamsulosin and Dutasteride  - I/O monitored     Recent staph infection, scalp abscess - continue home keflex     Depression - home meds held as pt was somnolent but pt now alert  - resume ropinrole as has been confused and potentially due to not getting these meds - pt's home med list includes Lexapro but pt has not been on it so will remove from the list     Obesity  - Body mass index is 38.94 kg/m.    OSA - on CPAP at night time   DVT prophylaxis: Coumadin on hold, SCD's requested  Code Status: Full  Family Communication: Patient at bedside, family updated over the phone  Disposition Plan: to be determined, pt not clinically stable for discharge at this time   Consultants:   Cardiology  Nephrology  WOC  GI  Procedures:   None  Antimicrobials:   Keflex continued from home for recent scalp abscess that was positive for staph infection   Subjective: Pt reports feeling better this AM but has had episode of confusion last night.   Objective: Vitals:   11/06/16 0000 11/06/16 0342 11/06/16 0701 11/06/16 0800  BP:  (!) 117/53 120/60   Pulse: (!) 58 (!) 59 (!) 58 (!) 59  Resp: 16 14 14 14   Temp:  97.9 F (36.6 C)    TempSrc:  Oral    SpO2: 96% 96% 100% 98%  Weight:  116.6 kg (257 lb)    Height:        Intake/Output Summary (Last 24 hours) at 11/06/16 1130 Last data filed at 11/06/16 0300  Gross per 24 hour  Intake                0 ml  Output              450 ml  Net             -450 ml   Filed Weights   11/04/16 0412 11/05/16 0500  11/06/16 0342  Weight: 112.8 kg (248 lb 9.6 oz) 113.4 kg (250 lb) 116.6 kg (257 lb)  Examination:  General exam: Appears more alert this AM, NAD Respiratory system: respiratory effort is stable crackles noted at bases, non productive cough also noted Cardiovascular system: paced rhythm, no rubs, gallops or clicks. +1 L LE edema, RLE in ace wrap Gastrointestinal system: Abdomen is nondistended, soft and nontender. Normal bowel sounds heard. Central nervous system: alert and oriented x 3 Extremities: Symmetric 5 x 5 power.  Skin: RLE in ACE wrap  Data Reviewed: I have personally reviewed following labs and imaging studies  CBC:  Recent Labs Lab 11/03/16 1327 11/03/16 2239 11/04/16 0313 11/05/16 0236 11/06/16 0209  WBC 4.4 4.7 4.9 6.6 7.5  HGB 7.6* 7.4* 7.6* 7.6* 7.5*  HCT 23.6* 23.3* 24.5* 24.0* 24.0*  MCV 102.6* 102.2* 102.9* 102.1* 101.3*  PLT 160 174 173 181 951   Basic Metabolic Panel:  Recent Labs Lab 11/03/16 2239  11/04/16 0953 11/04/16 1233 11/05/16 0236 11/05/16 1206 11/06/16 0209  NA 136  < > 135 135 135 136 135  K 5.8*  < > 6.3* 6.2* 6.0* 5.8* 5.5*  CL 102  < > 104 104 102 102 103  CO2 23  < > 19* 19* 21* 24 23  GLUCOSE 109*  < > 111* 122* 101* 103* 103*  BUN 85*  < > 88* 89* 93* 98* 104*  CREATININE 3.22*  < > 3.28* 3.39* 3.53* 3.80* 4.01*  CALCIUM 9.2  < > 9.1 9.1 9.0 8.7* 8.8*  MG 2.3  --   --   --   --   --   --   PHOS 6.1*  --   --   --   --   --   --   < > = values in this interval not displayed. Liver Function Tests:  Recent Labs Lab 11/03/16 1640 11/04/16 0313 11/05/16 0236 11/06/16 0209  AST 173* 198* 176* 433*  ALT 90* 101* 109* 222*  ALKPHOS 51 50 52 51  BILITOT 1.3* 1.0 1.2 1.7*  PROT 6.7 6.6 6.7 6.5  ALBUMIN 3.2* 3.0* 3.0* 3.1*    Recent Labs Lab 11/03/16 1640  LIPASE 16   Coagulation Profile:  Recent Labs Lab 11/03/16 1327 11/04/16 0953 11/05/16 0236 11/06/16 0209  INR 5.06* 5.41* 4.26* 2.81   Cardiac  Enzymes:  Recent Labs Lab 11/03/16 2239 11/04/16 0313 11/04/16 0953  TROPONINI 0.28* 0.27* 0.20*   Thyroid Function Tests:  Recent Labs  11/03/16 2239  TSH 2.991   Recent Results (from the past 240 hour(s))  MRSA PCR Screening     Status: Abnormal   Collection Time: 11/04/16 12:39 AM  Result Value Ref Range Status   MRSA by PCR POSITIVE (A) NEGATIVE Final    Comment:        The GeneXpert MRSA Assay (FDA approved for NASAL specimens only), is one component of a comprehensive MRSA colonization surveillance program. It is not intended to diagnose MRSA infection nor to guide or monitor treatment for MRSA infections. RESULT CALLED TO, READ BACK BY AND VERIFIED WITH: CAITLYN,RN  ON 4N @0241  11/04/16 MKELLY,MLT     Radiology Studies: Ct Head Wo Contrast  Result Date: 11/06/2016 CLINICAL DATA:  Confusion. EXAM: CT HEAD WITHOUT CONTRAST TECHNIQUE: Contiguous axial images were obtained from the base of the skull through the vertex without intravenous contrast. COMPARISON:  August 20, 2014 FINDINGS: Brain: No subdural, epidural, or subarachnoid hemorrhage. Low-attenuation in the left cerebellar hemisphere on series 3, image 9 is identified. There is streak artifact in this region. There is no associated  mass effect on the fourth ventricle. The cerebellum, brainstem, and basal cisterns are otherwise normal. Ventricles and sulci are mildly prominent but stable. White matter changes are moderate and similar in the interval. A lacunar infarct in the left basal ganglia is unchanged. Low-attenuation through the left temporal lobe is thought to be due to streak artifact. No other sites of ischemia or infarct are identified. No mass, mass effect, or midline shift. Vascular: Calcified atherosclerosis is seen in the intracranial carotid arteries. Skull: Normal. Negative for fracture or focal lesion. Sinuses/Orbits: No acute finding. Other: None. IMPRESSION: 1. There is low-attenuation in the left  cerebellar hemisphere. The finding could be at least partially artifactual, due to streak artifact and beam hardening. However, an age-indeterminate infarct is not excluded on this study. Recommend clinical correlation. An MRI could further assess if clinically warranted. 2. No bleed identified.  No other acute abnormalities. These results will be called to the ordering clinician or representative by the Radiologist Assistant, and communication documented in the PACS or zVision Dashboard. Electronically Signed   By: Dorise Bullion III M.D   On: 11/06/2016 09:01   US Renal  Result Date: 11/04/2016 CLINICAL DATA:  Acute renal failure EXAM: RENAL / URINARY TRACT ULTRASOUND COMPLETE COMPARISON:  Abdominal ultrasound 01/25/2006 FINDINGS: Right Kidney: Length: 13.8 cm. 4.3 cm right midpole cyst, 4 cm right upper pole cyst. Echogenicity within normal limits. No mass or hydronephrosis visualized. Left Kidney: Length: 13.8 cm. 7 cm left lower pole cyst. 3 cm and 2 cm central cysts. Echogenicity within normal limits. No mass or hydronephrosis visualized. Bladder: Negative IMPRESSION: Bilateral renal cysts.  No renal obstruction.  Normal renal size. Electronically Signed   By: Franchot Gallo M.D.   On: 11/04/2016 16:39   US Abdomen Limited Ruq  Result Date: 11/04/2016 CLINICAL DATA:  Transaminitis EXAM: US ABDOMEN LIMITED - RIGHT UPPER QUADRANT COMPARISON:  Ultrasound 01/25/2006 FINDINGS: Gallbladder: Negative for gallstones. Gallbladder wall thickened 5.2 mm. Negative sonographic Murphy sign. Common bile duct: Diameter: 2.6 mm Liver: Increased echogenicity liver diffusely most compatible with fatty infiltration. No focal liver lesion. No ascites in the right upper quadrant IMPRESSION: Gallbladder wall thickening without gallstones or positive Murphy sign. Possible acute or chronic cholecystitis. Also possible causes of gallbladder wall thickening would include liver failure and heart failure. Echogenic liver  compatible with fatty infiltration. Electronically Signed   By: Franchot Gallo M.D.   On: 11/04/2016 16:41   Scheduled Meds: . allopurinol  200 mg Oral Daily  . cephALEXin  250 mg Oral Q8H  . Chlorhexidine Gluconate Cloth  6 each Topical Q0600  . dutasteride  0.5 mg Oral Daily  . furosemide  80 mg Intravenous BID  . insulin aspart  0-9 Units Subcutaneous TID WC  . mouth rinse  15 mL Mouth Rinse BID  . mupirocin ointment  1 application Nasal BID  . patiromer  8.4 g Oral Daily  . sodium chloride flush  3 mL Intravenous Q12H  . tamsulosin  0.4 mg Oral QPC breakfast   Continuous Infusions:   LOS: 3 days   Time spent: 20 minutes   Faye Ramsay, MD [facility] Pager [phone]  If 7PM-7AM, please contact night-coverage [email or web address] Password [email or web address] 11/06/2016, 11:30 AM

## 2016-11-07 DIAGNOSIS — Z79899 Other long term (current) drug therapy: Secondary | ICD-10-CM

## 2016-11-07 DIAGNOSIS — R410 Disorientation, unspecified: Secondary | ICD-10-CM

## 2016-11-07 DIAGNOSIS — Z794 Long term (current) use of insulin: Secondary | ICD-10-CM

## 2016-11-07 LAB — COMPREHENSIVE METABOLIC PANEL
ALT: 175 U/L — ABNORMAL HIGH (ref 17–63)
AST: 238 U/L — AB (ref 15–41)
Albumin: 3 g/dL — ABNORMAL LOW (ref 3.5–5.0)
Alkaline Phosphatase: 50 U/L (ref 38–126)
Anion gap: 6 (ref 5–15)
BUN: 92 mg/dL — AB (ref 6–20)
CO2: 29 mmol/L (ref 22–32)
Calcium: 8.7 mg/dL — ABNORMAL LOW (ref 8.9–10.3)
Chloride: 103 mmol/L (ref 101–111)
Creatinine, Ser: 2.55 mg/dL — ABNORMAL HIGH (ref 0.61–1.24)
GFR calc Af Amer: 25 mL/min — ABNORMAL LOW (ref >60)
GFR calc non Af Amer: 22 mL/min — ABNORMAL LOW (ref >60)
Glucose, Bld: 122 mg/dL — ABNORMAL HIGH (ref 65–99)
POTASSIUM: 3.8 mmol/L (ref 3.5–5.1)
Sodium: 138 mmol/L (ref 135–145)
TOTAL PROTEIN: 6.6 g/dL (ref 6.5–8.1)
Total Bilirubin: 1.9 mg/dL — ABNORMAL HIGH (ref 0.3–1.2)

## 2016-11-07 LAB — CBC
HEMATOCRIT: 22.7 % — AB (ref 39.0–52.0)
HEMOGLOBIN: 7.5 g/dL — AB (ref 13.0–17.0)
MCH: 32.9 pg (ref 26.0–34.0)
MCHC: 33 g/dL (ref 30.0–36.0)
MCV: 99.6 fL (ref 78.0–100.0)
Platelets: 161 10*3/uL (ref 150–400)
RBC: 2.28 MIL/uL — ABNORMAL LOW (ref 4.22–5.81)
RDW: 17.3 % — ABNORMAL HIGH (ref 11.5–15.5)
WBC: 5.4 10*3/uL (ref 4.0–10.5)

## 2016-11-07 LAB — RENAL FUNCTION PANEL
ALBUMIN: 3.2 g/dL — AB (ref 3.5–5.0)
ANION GAP: 13 (ref 5–15)
BUN: 96 mg/dL — ABNORMAL HIGH (ref 6–20)
CHLORIDE: 101 mmol/L (ref 101–111)
CO2: 22 mmol/L (ref 22–32)
Calcium: 9 mg/dL (ref 8.9–10.3)
Creatinine, Ser: 2.83 mg/dL — ABNORMAL HIGH (ref 0.61–1.24)
GFR calc Af Amer: 22 mL/min — ABNORMAL LOW (ref >60)
GFR, EST NON AFRICAN AMERICAN: 19 mL/min — AB (ref >60)
Glucose, Bld: 87 mg/dL (ref 65–99)
PHOSPHORUS: 4.1 mg/dL (ref 2.5–4.6)
POTASSIUM: 3.9 mmol/L (ref 3.5–5.1)
Sodium: 136 mmol/L (ref 135–145)

## 2016-11-07 LAB — PREPARE RBC (CROSSMATCH)

## 2016-11-07 LAB — GLUCOSE, CAPILLARY
GLUCOSE-CAPILLARY: 132 mg/dL — AB (ref 65–99)
GLUCOSE-CAPILLARY: 134 mg/dL — AB (ref 65–99)
Glucose-Capillary: 106 mg/dL — ABNORMAL HIGH (ref 65–99)
Glucose-Capillary: 116 mg/dL — ABNORMAL HIGH (ref 65–99)

## 2016-11-07 LAB — PROTIME-INR
INR: 2.2
Prothrombin Time: 24.8 seconds — ABNORMAL HIGH (ref 11.4–15.2)

## 2016-11-07 MED ORDER — ROPINIROLE HCL 1 MG PO TABS
3.0000 mg | ORAL_TABLET | Freq: Every day | ORAL | Status: DC
  Administered 2016-11-07 – 2016-11-09 (×4): 3 mg via ORAL
  Filled 2016-11-07 (×4): qty 3

## 2016-11-07 MED ORDER — DOXYCYCLINE HYCLATE 100 MG PO TABS
100.0000 mg | ORAL_TABLET | Freq: Two times a day (BID) | ORAL | Status: DC
  Administered 2016-11-07 – 2016-11-10 (×7): 100 mg via ORAL
  Filled 2016-11-07 (×7): qty 1

## 2016-11-07 MED ORDER — TRAMADOL HCL 50 MG PO TABS
50.0000 mg | ORAL_TABLET | Freq: Four times a day (QID) | ORAL | Status: DC | PRN
  Administered 2016-11-07: 50 mg via ORAL
  Administered 2016-11-08 – 2016-11-09 (×2): 100 mg via ORAL
  Filled 2016-11-07: qty 1
  Filled 2016-11-07 (×3): qty 2

## 2016-11-07 MED ORDER — SODIUM CHLORIDE 0.9 % IV SOLN
Freq: Once | INTRAVENOUS | Status: AC
  Administered 2016-11-07: 15:00:00 via INTRAVENOUS

## 2016-11-07 MED ORDER — HYDROXYZINE HCL 50 MG/ML IM SOLN
25.0000 mg | Freq: Four times a day (QID) | INTRAMUSCULAR | Status: DC | PRN
  Administered 2016-11-07 – 2016-11-08 (×3): 25 mg via INTRAMUSCULAR
  Filled 2016-11-07 (×5): qty 0.5

## 2016-11-07 NOTE — Progress Notes (Signed)
Blood transfusion started @120  cc/hr, no any side effects has been noted during first 15 min, will continue to monitor the patient.

## 2016-11-07 NOTE — Progress Notes (Signed)
Called blood bank regarding how long it can be stopped she said 15 min more is fine, IV nurse is working on now.

## 2016-11-07 NOTE — Progress Notes (Signed)
Progress Note  Patient Name: [name] Date of Encounter: 11/07/2016  Primary Cardiologist: Kelly/Arianah Torgeson  Subjective   Not as agitated as yesterday, but still clearly restless. He still complains of a lot of generalized itching. He is fully alert and clearly oriented. It's unusual that he is using gestures to communicate rather than words, however it does not appear that he has true a stage 0. When I compel him to use words he can do it just fine. He is able to read complicated words from the brochure on his table.  Inpatient Medications    Scheduled Meds: . allopurinol  200 mg Oral Daily  . cephALEXin  250 mg Oral Q8H  . Chlorhexidine Gluconate Cloth  6 each Topical Q0600  . dutasteride  0.5 mg Oral Daily  . ferumoxytol  510 mg Intravenous Weekly  . furosemide  80 mg Intravenous BID  . insulin aspart  0-9 Units Subcutaneous TID WC  . mouth rinse  15 mL Mouth Rinse BID  . mupirocin ointment  1 application Nasal BID  . patiromer  8.4 g Oral Daily  . rOPINIRole  3 mg Oral QHS  . sodium chloride flush  3 mL Intravenous Q12H  . tamsulosin  0.4 mg Oral QPC breakfast   Continuous Infusions:  PRN Meds: albuterol, bisacodyl, guaiFENesin, hydrOXYzine, mirtazapine, traMADol   Vital Signs    Vitals:   11/06/16 2303 11/06/16 2320 11/07/16 0448 11/07/16 0900  BP: (!) 108/55  121/70 (!) 121/53  Pulse: (!) 59 62 61 60  Resp: 20 18 18 18   Temp: 98.6 F (37 C)  98 F (36.7 C) 97.5 F (36.4 C)  TempSrc: Oral  Oral Oral  SpO2: 99% 99% 100% 100%  Weight:   112.1 kg (247 lb 1.6 oz) 110.5 kg (243 lb 11.2 oz)  Height:   5\' 7"  (1.702 m)     Intake/Output Summary (Last 24 hours) at 11/07/16 1236 Last data filed at 11/07/16 0800  Gross per 24 hour  Intake              117 ml  Output             2375 ml  Net            -2258 ml   Filed Weights   11/06/16 0342 11/07/16 0448 11/07/16 0900  Weight: 116.6 kg (257 lb) 112.1 kg (247 lb 1.6 oz) 110.5 kg (243 lb 11.2 oz)     Telemetry    Atrial fibrillation, rate controlled mostly ventricular paced - Personally Reviewed  Physical Exam  Slightly restless. Oriented 3, but prefers using gestures and sounds to communicate.. When I asked him to speak in full sentences, he can do it and does not appear to have true aphasia GEN: No acute distress.   Neck:  4-6 centimeters JVD Cardiac: RRR, split S2, no murmurs, rubs, or gallops. Healthy pacemaker site Respiratory: Clear to auscultation bilaterally. GI: Soft, nontender, non-distended  MS: No edema; No deformity. Neuro:  Nonfocal  Psych: Normal affect   Labs    Chemistry Recent Labs Lab 11/04/16 0313  11/05/16 0236  11/06/16 0209 11/06/16 1246 11/07/16 0608  NA 134*  < > 135  < > 135 135 136  K 6.4*  < > 6.0*  < > 5.5* 5.6* 3.9  CL 103  < > 102  < > 103 103 101  CO2 23  < > 21*  < > 23 21* 22  GLUCOSE 120*  < >  101*  < > 103* 119* 87  BUN 87*  < > 93*  < > 104* 107* 96*  CREATININE 3.35*  < > 3.53*  < > 4.01* 3.50* 2.83*  CALCIUM 8.9  < > 9.0  < > 8.8* 8.6* 9.0  PROT 6.6  --  6.7  --  6.5  --   --   ALBUMIN 3.0*  --  3.0*  --  3.1*  --  3.2*  AST 198*  --  176*  --  433*  --   --   ALT 101*  --  109*  --  222*  --   --   ALKPHOS 50  --  52  --  51  --   --   BILITOT 1.0  --  1.2  --  1.7*  --   --   GFRNONAA 16*  < > 15*  < > 13* 15* 19*  GFRAA 18*  < > 17*  < > 15* 17* 22*  ANIONGAP 8  < > 12  < > 9 11 13   < > = values in this interval not displayed.   Hematology Recent Labs Lab 11/05/16 0236 11/06/16 0209 11/07/16 0608  WBC 6.6 7.5 5.4  RBC 2.35* 2.37* 2.28*  HGB 7.6* 7.5* 7.5*  HCT 24.0* 24.0* 22.7*  MCV 102.1* 101.3* 99.6  MCH 32.3 31.6 32.9  MCHC 31.7 31.3 33.0  RDW 17.2* 17.0* 17.3*  PLT 181 182 161    Cardiac Enzymes Recent Labs Lab 11/03/16 2239 11/04/16 0313 11/04/16 0953  TROPONINI 0.28* 0.27* 0.20*    Recent Labs Lab 11/03/16 1358 11/03/16 1732  TROPIPOC 0.11* 0.13*     BNP Recent Labs Lab  11/04/16 1532  BNP 1,048.9*     DDimer No results for input(s): DDIMER in the last 168 hours.   Radiology    Ct Head Wo Contrast  Result Date: 11/06/2016 CLINICAL DATA:  Confusion. EXAM: CT HEAD WITHOUT CONTRAST TECHNIQUE: Contiguous axial images were obtained from the base of the skull through the vertex without intravenous contrast. COMPARISON:  August 20, 2014 FINDINGS: Brain: No subdural, epidural, or subarachnoid hemorrhage. Low-attenuation in the left cerebellar hemisphere on series 3, image 9 is identified. There is streak artifact in this region. There is no associated mass effect on the fourth ventricle. The cerebellum, brainstem, and basal cisterns are otherwise normal. Ventricles and sulci are mildly prominent but stable. White matter changes are moderate and similar in the interval. A lacunar infarct in the left basal ganglia is unchanged. Low-attenuation through the left temporal lobe is thought to be due to streak artifact. No other sites of ischemia or infarct are identified. No mass, mass effect, or midline shift. Vascular: Calcified atherosclerosis is seen in the intracranial carotid arteries. Skull: Normal. Negative for fracture or focal lesion. Sinuses/Orbits: No acute finding. Other: None. IMPRESSION: 1. There is low-attenuation in the left cerebellar hemisphere. The finding could be at least partially artifactual, due to streak artifact and beam hardening. However, an age-indeterminate infarct is not excluded on this study. Recommend clinical correlation. An MRI could further assess if clinically warranted. 2. No bleed identified.  No other acute abnormalities. These results will be called to the ordering clinician or representative by the Radiologist Assistant, and communication documented in the PACS or zVision Dashboard. Electronically Signed   By: Dorise Bullion III M.D   On: 11/06/2016 09:01    Cardiac Studies   - Left ventricle: The cavity size was moderately dilated.  There was   moderate concentric hypertrophy. Systolic function was normal.   The estimated ejection fraction was in the range of 50% to 55%.   Wall motion was normal; there were no regional wall motion   abnormalities. Doppler parameters are consistent with restrictive   physiology, indicative of decreased left ventricular diastolic   compliance and/or increased left atrial pressure. Doppler   parameters are consistent with elevated ventricular end-diastolic   filling pressure. - Ventricular septum: Septal motion showed paradox. - Aortic valve: Trileaflet; moderately thickened, moderately   calcified leaflets. Valve mobility was restricted. Transvalvular   velocity was within the normal range. There was no stenosis.   There was mild regurgitation. - Mitral valve: Calcified annulus. Mildly thickened leaflets .   There was mild regurgitation. - Left atrium: The atrium was severely dilated. - Right ventricle: The cavity size was moderately dilated. Wall   thickness was normal. Systolic function was mildly reduced. - Right atrium: The atrium was severely dilated. Pacer wire or   catheter noted in right atrium. - Tricuspid valve: There was moderate-severe regurgitation. - Pulmonic valve: There was mild regurgitation. - Pulmonary arteries: Systolic pressure was moderately increased.   PA peak pressure: 43 mm Hg (S). - Inferior vena cava: The vessel was dilated. The respirophasic   diameter changes were blunted (< 50%), consistent with elevated   central venous pressure.  Patient Profile     83 y.o. male with chronic diastolic heart failure, OSA on CPAP, chronic atrial fibrillation, CAD presents with volume overload, coagulopathy, acute renal insufficiency, hyperkalemia with uncertain cause (recent Bactrim Rx). Developed delirium and generalized pruritus without rash after admission.  Assessment & Plan    1. CHF:  No change in LV function by echo, same signs of RV dysfunction due to cor  pulmonale.  Right heart failure is probably the dominant abnormality. Reports good compliance with CPAP, but believes his CPAP machine is outdated. 2. AFib: permanent, slow response with high prevalence of V pacing even without rate control meds, unchanged.  3. PPM: mostly V paced, but this is not new. 4. Coagulopathy: presumably explained by passive liver congestion. [place] raised possibility of fatty liver and cholecystitis, but these changes might be explained by volume overload. 5. Acute renal insuff:  Appears he had ATN, possibly due to hypotension, possibly also Trimethoprim related (note marked and persistent hyperkalemia). No obstruction on [place]. Today renal function shows clear evidence of improvement in his potassium is finally normal. 6. CAD: no angina. Troponin elevation is mild and unchanging, not consistent with acute coronary sd. 7. Anemia: Some blood loss from his varicose veins, but also has history of GI bleeding in the past. GI evaluation has been requested.   Signed, [name], MD  11/07/2016, 12:36 PM

## 2016-11-07 NOTE — Progress Notes (Signed)
Patient ID: [name], male   DOB: [birth year], 83 y.o.   MRN: [id number]    PROGRESS NOTE  GIOVONNIE TRETTEL  BTD:[id number] DOB: 05/27/1932 DOA: 11/03/2016  PCP: [name], MD   Brief Narrative:  a 83 y.o. male with a-fib on coumadin, MI, non ischemic cardiomyopathy, s/p pacemaker, HTN, BPH (chronic urinary retention and taking Dutasteride and Tamsulosin), presented earlier to PCP office for dyspnea and was referred to ED due to multiple blood work abnormalities. Pt reports ongoing dyspnea for several days, poor oral intake and fatigue. No active chest pain, no urinary concerns. Pt explains he has had some "heaviness in the stomach" and felt full and was not able to eat. He does have varicose veins RLE and has ACE wrap but says earlier in the day he had lots of bleeding from the varicose veins, 6 episodes of significant blood loss, unable to quantify. Pt reports having recent scalp abscess that was lanced and tested + for staph, he has been on antibiotic for it.   ED Course: In ED, pt noted to be hemodynamically stable, VS notable for HR in 50-60's, initial oxygen saturation 88% on RA and pt placed on oxygen via [place], O2 sat up to 100%. Blood work notable for Hg 7.6, K 6 --> 6.3, Cr 3.35 --> 3.36. INR 5. Pt give dose of calcium gluconate and TRH asked to admit to step down unit due to severe electrolyte derangements.   Assessment & Plan:  Principal Problem:   ARF (acute renal failure) ([place]) imposed on CKD stage III - imposed on CKD stage III, GFR in 50's with stable Cr in January 2016, last report in 09/2016 Cr was 1.5  - likely multifactorial in etiology, pre renal --> ATN in the setting of bleeding, lasix use, losartan use, poor oral intake  - ARB's still on hold, Lasix done increased from 40 mg --> 80 IV BID started 3/29 - Cr is now trending down from 3.39 --> 3.53 --> 4.01 --> 3.5 --> 2.83 - this is the weight trend in the past 48 hours  Filed Weights   11/06/16 0342 11/07/16 0448  11/07/16 0900  Weight: 116.6 kg (257 lb) 112.1 kg (247 lb 1.6 oz) 110.5 kg (243 lb 11.2 oz)  - renal US with no evidence of hydronephrosis  - appreciate nephrology team assistance   Active Problems:   Hyperkalemia, hyperphosphatemia  - gave two doses of Ca gluconate in ED - also has gotten kayexalate x 2 on 3/29, also given one dose this AM 3/30, will give one more dose 15 gm 3/31 - K trend: 5.7 --> 5.8 --> 6.4 --> 6 --> 5.5 --> 3.5 this AM  - pt has been on K supplementation at home but this has been held since admission - repeat BMP this afternoon to make sure K is stable and Cr trending down     Dyspnea - from acute blood loss anemia, component of pulmonary vascular congestion  - Hg has remained at 7.4 - 7.6 with no signs of active bleeding at this time  - keep on oxygen if needed to maintain O2 saturations > 90% - GI team consulted and assistance is appreciated - plan is for EGD once pt more medically stable and mental status more clear     Acute blood loss anemia - in pt on coumadin, had some bleeding from varicose veins, RLE but not sure if that is the only source  - coumadin was held since admission, INR  trending down: 5 --> 5.41 --> 4.26 --> 2.81 -->2.3 - FOBT +, anemia panel with low iron  - pt has gotten one dose of Vit K 2.5 mg (3/29) - pt did have EGD in 2016 done by Dr. Watt Climes, noted few small AVM - if GI plans to perform EGD, ? If we need to continue to hold Coumadin     Acute on Chronic diastolic heart failure ([place]) - last ECHO in 2016 with stable EF - paced rhythm - monitor daily weights, strict I/O - ECHO on this admission with EF 50 - 55% - pt started on Lasix 40 mg IV BID, increased the dose to 80 mg IV BID on 3/30 - weight trend since admission  Filed Weights   11/06/16 0342 11/07/16 0448 11/07/16 0900  Weight: 116.6 kg (257 lb) 112.1 kg (247 lb 1.6 oz) 110.5 kg (243 lb 11.2 oz)     Transaminitis  - ? Congestive etiology  - monitor for now  - RUQ [place]  with gallbladder wall thickening can be explained by congestive etiology  - d/w GI if any role for repeat LFT's    RLE wound, varicose vein, venous stasis ulcer  - wound care consulted for assistance  - overall stable, healing     Atrial fibrillation, permanent, slow VR, on chronic anticoagulation    Pacemaker single chamber, [employer], 2013   Elevated troponin, demand ischemia in the setting of ARF  - INR supra therapeutic on admission and coumadin has been held - INR overall trending down and is at target range this AM  - cardiology assistance appreciate     BPH (benign prostatic hyperplasia) - resumed home medical regimen Tamsulosin and Dutasteride  - I/O monitored     Recent staph infection, scalp abscess - continue home keflex     Depression - psych consulted for assistance as pt still with intermittent hallucinations     Obesity  - Body mass index is 38.94 kg/m.    OSA - on CPAP at night time   DVT prophylaxis: Coumadin on hold, SCD's requested  Code Status: Full  Family Communication: Patient at bedside, family updated over the phone  Disposition Plan: to be determined, pt not clinically stable for discharge at this time   Consultants:   Cardiology  Nephrology  WOC  GI  Psych   Procedures:   None  Antimicrobials:   Keflex continued from home for recent scalp abscess that was positive for staph infection   Subjective: Pt reports feeling better this AM but has had episodes of confusion and hallucinations yesterday.  Itching this AM.   Objective: Vitals:   11/06/16 2303 11/06/16 2320 11/07/16 0448 11/07/16 0900  BP: (!) 108/55  121/70 (!) 121/53  Pulse: (!) 59 62 61 60  Resp: 20 18 18 18   Temp: 98.6 F (37 C)  98 F (36.7 C) 97.5 F (36.4 C)  TempSrc: Oral  Oral Oral  SpO2: 99% 99% 100% 100%  Weight:   112.1 kg (247 lb 1.6 oz) 110.5 kg (243 lb 11.2 oz)  Height:   5\' 7"  (1.702 m)     Intake/Output Summary (Last 24  hours) at 11/07/16 1132 Last data filed at 11/07/16 0800  Gross per 24 hour  Intake              117 ml  Output             2375 ml  Net            -  2258 ml   Filed Weights   11/06/16 0342 11/07/16 0448 11/07/16 0900  Weight: 116.6 kg (257 lb) 112.1 kg (247 lb 1.6 oz) 110.5 kg (243 lb 11.2 oz)   Examination:  General exam: Appears more alert this AM, NAD Respiratory system: respiratory effort is stable crackles noted at bases, non productive cough also noted Cardiovascular system: paced rhythm, no rubs, gallops or clicks. +1 L LE edema, RLE in ace wrap Gastrointestinal system: Abdomen is nondistended, soft and nontender. Normal bowel sounds heard. Central nervous system: alert and oriented x 3 Extremities: Symmetric 5 x 5 power.  Skin: RLE in ACE wrap  Data Reviewed: I have personally reviewed following labs and imaging studies  CBC:  Recent Labs Lab 11/03/16 2239 11/04/16 0313 11/05/16 0236 11/06/16 0209 11/07/16 0608  WBC 4.7 4.9 6.6 7.5 5.4  HGB 7.4* 7.6* 7.6* 7.5* 7.5*  HCT 23.3* 24.5* 24.0* 24.0* 22.7*  MCV 102.2* 102.9* 102.1* 101.3* 99.6  PLT 174 173 181 182 536   Basic Metabolic Panel:  Recent Labs Lab 11/03/16 2239  11/05/16 0236 11/05/16 1206 11/06/16 0209 11/06/16 1246 11/07/16 0608  NA 136  < > 135 136 135 135 136  K 5.8*  < > 6.0* 5.8* 5.5* 5.6* 3.9  CL 102  < > 102 102 103 103 101  CO2 23  < > 21* 24 23 21* 22  GLUCOSE 109*  < > 101* 103* 103* 119* 87  BUN 85*  < > 93* 98* 104* 107* 96*  CREATININE 3.22*  < > 3.53* 3.80* 4.01* 3.50* 2.83*  CALCIUM 9.2  < > 9.0 8.7* 8.8* 8.6* 9.0  MG 2.3  --   --   --   --   --   --   PHOS 6.1*  --   --   --   --   --  4.1  < > = values in this interval not displayed. Liver Function Tests:  Recent Labs Lab 11/03/16 1640 11/04/16 0313 11/05/16 0236 11/06/16 0209 11/07/16 0608  AST 173* 198* 176* 433*  --   ALT 90* 101* 109* 222*  --   ALKPHOS 51 50 52 51  --   BILITOT 1.3* 1.0 1.2 1.7*  --   PROT 6.7  6.6 6.7 6.5  --   ALBUMIN 3.2* 3.0* 3.0* 3.1* 3.2*    Recent Labs Lab 11/03/16 1640  LIPASE 16   Coagulation Profile:  Recent Labs Lab 11/03/16 1327 11/04/16 0953 11/05/16 0236 11/06/16 0209 11/07/16 0608  INR 5.06* 5.41* 4.26* 2.81 2.20   Cardiac Enzymes:  Recent Labs Lab 11/03/16 2239 11/04/16 0313 11/04/16 0953  TROPONINI 0.28* 0.27* 0.20*   Thyroid Function Tests: No results for input(s): TSH, T4TOTAL, FREET4, T3FREE, THYROIDAB in the last 72 hours. Recent Results (from the past 240 hour(s))  MRSA PCR Screening     Status: Abnormal   Collection Time: 11/04/16 12:39 AM  Result Value Ref Range Status   MRSA by PCR POSITIVE (A) NEGATIVE Final    Comment:        The GeneXpert MRSA Assay (FDA approved for NASAL specimens only), is one component of a comprehensive MRSA colonization surveillance program. It is not intended to diagnose MRSA infection nor to guide or monitor treatment for MRSA infections. RESULT CALLED TO, READ BACK BY AND VERIFIED WITH: CAITLYN,RN  ON 4N @0241  11/04/16 MKELLY,MLT     Radiology Studies: Ct Head Wo Contrast  Result Date: 11/06/2016 CLINICAL DATA:  Confusion. EXAM: CT HEAD WITHOUT  CONTRAST TECHNIQUE: Contiguous axial images were obtained from the base of the skull through the vertex without intravenous contrast. COMPARISON:  August 20, 2014 FINDINGS: Brain: No subdural, epidural, or subarachnoid hemorrhage. Low-attenuation in the left cerebellar hemisphere on series 3, image 9 is identified. There is streak artifact in this region. There is no associated mass effect on the fourth ventricle. The cerebellum, brainstem, and basal cisterns are otherwise normal. Ventricles and sulci are mildly prominent but stable. White matter changes are moderate and similar in the interval. A lacunar infarct in the left basal ganglia is unchanged. Low-attenuation through the left temporal lobe is thought to be due to streak artifact. No other sites of  ischemia or infarct are identified. No mass, mass effect, or midline shift. Vascular: Calcified atherosclerosis is seen in the intracranial carotid arteries. Skull: Normal. Negative for fracture or focal lesion. Sinuses/Orbits: No acute finding. Other: None. IMPRESSION: 1. There is low-attenuation in the left cerebellar hemisphere. The finding could be at least partially artifactual, due to streak artifact and beam hardening. However, an age-indeterminate infarct is not excluded on this study. Recommend clinical correlation. An MRI could further assess if clinically warranted. 2. No bleed identified.  No other acute abnormalities. These results will be called to the ordering clinician or representative by the Radiologist Assistant, and communication documented in the PACS or zVision Dashboard. Electronically Signed   By: Dorise Bullion III M.D   On: 11/06/2016 09:01   Scheduled Meds: . allopurinol  200 mg Oral Daily  . cephALEXin  250 mg Oral Q8H  . Chlorhexidine Gluconate Cloth  6 each Topical Q0600  . dutasteride  0.5 mg Oral Daily  . ferumoxytol  510 mg Intravenous Weekly  . furosemide  80 mg Intravenous BID  . insulin aspart  0-9 Units Subcutaneous TID WC  . mouth rinse  15 mL Mouth Rinse BID  . mupirocin ointment  1 application Nasal BID  . patiromer  8.4 g Oral Daily  . rOPINIRole  3 mg Oral QHS  . sodium chloride flush  3 mL Intravenous Q12H  . tamsulosin  0.4 mg Oral QPC breakfast   Continuous Infusions:   LOS: 4 days   Time spent: 20 minutes   Faye Ramsay, MD [facility] Pager [phone]  If 7PM-7AM, please contact night-coverage [email or web address] Password [facility] 11/07/2016, 11:32 AM

## 2016-11-07 NOTE — Progress Notes (Signed)
  There is a plan to do EGD tomorrow am, pt is going to be in nPO since midnight, patient and family members aware, Consent taken for EGD and also for the blood transfusion

## 2016-11-07 NOTE — Progress Notes (Signed)
[facility] PROGRESS NOTE Subjective patient more alert and less confused. No gross G.I. bleeding but as Coumadin has been stopped in his INR is very near the normal range. He has had bleeding gastric AVMs fulgerated in the past by Dr. Watt Climes. He has had a history of colon polyps with multiple prior colonoscopies last 2011 negative other than diverticulosis. The issue is whether it is safe to resume as Coumadin. He does have hemoccult positive stools but does not have gross hematochezia.  Objective: Vital signs in last 24 hours: Temp:  [97.5 F (36.4 C)-98.6 F (37 C)] 97.5 F (36.4 C) (04/01 0900) Pulse Rate:  [59-111] 60 (04/01 0900) Resp:  [14-20] 18 (04/01 0900) BP: (97-121)/(53-73) 121/53 (04/01 0900) SpO2:  [99 %-100 %] 100 % (04/01 0900) Weight:  [110.5 kg (243 lb 11.2 oz)-112.1 kg (247 lb 1.6 oz)] 110.5 kg (243 lb 11.2 oz) (04/01 0900) Last BM Date: 11/06/16  Intake/Output from previous day: 03/31 0701 - 04/01 0700 In: 117 [IV Piggyback:117] Out: 1625 [Urine:1625] Intake/Output this shift: Total I/O In: 480 [P.O.:480] Out: 1850 [Urine:1850]  PE: General-- much more alert and oriented  Abdomen-- obese and nontender  Lab Results:  Recent Labs  11/05/16 0236 11/06/16 0209 11/07/16 0608  WBC 6.6 7.5 5.4  HGB 7.6* 7.5* 7.5*  HCT 24.0* 24.0* 22.7*  PLT 181 182 161   BMET  Recent Labs  11/05/16 0236 11/05/16 1206 11/06/16 0209 11/06/16 1246 11/07/16 0608  NA 135 136 135 135 136  K 6.0* 5.8* 5.5* 5.6* 3.9  CL 102 102 103 103 101  CO2 21* 24 23 21* 22  CREATININE 3.53* 3.80* 4.01* 3.50* 2.83*   LFT  Recent Labs  11/05/16 0236 11/06/16 0209  PROT 6.7 6.5  AST 176* 433*  ALT 109* 222*  ALKPHOS 52 51  BILITOT 1.2 1.7*   PT/INR  Recent Labs  11/05/16 0236 11/06/16 0209 11/07/16 0608  LABPROT 42.0* 30.2* 24.8*  INR 4.26* 2.81 2.20   PANCREAS No results for input(s): LIPASE in the last 72 hours.       Studies/Results: Ct Head  Wo Contrast  Result Date: 11/06/2016 CLINICAL DATA:  Confusion. EXAM: CT HEAD WITHOUT CONTRAST TECHNIQUE: Contiguous axial images were obtained from the base of the skull through the vertex without intravenous contrast. COMPARISON:  August 20, 2014 FINDINGS: Brain: No subdural, epidural, or subarachnoid hemorrhage. Low-attenuation in the left cerebellar hemisphere on series 3, image 9 is identified. There is streak artifact in this region. There is no associated mass effect on the fourth ventricle. The cerebellum, brainstem, and basal cisterns are otherwise normal. Ventricles and sulci are mildly prominent but stable. White matter changes are moderate and similar in the interval. A lacunar infarct in the left basal ganglia is unchanged. Low-attenuation through the left temporal lobe is thought to be due to streak artifact. No other sites of ischemia or infarct are identified. No mass, mass effect, or midline shift. Vascular: Calcified atherosclerosis is seen in the intracranial carotid arteries. Skull: Normal. Negative for fracture or focal lesion. Sinuses/Orbits: No acute finding. Other: None. IMPRESSION: 1. There is low-attenuation in the left cerebellar hemisphere. The finding could be at least partially artifactual, due to streak artifact and beam hardening. However, an age-indeterminate infarct is not excluded on this study. Recommend clinical correlation. An MRI could further assess if clinically warranted. 2. No bleed identified.  No other acute abnormalities. These results will be called to the ordering clinician or representative by the Radiologist Assistant, and  communication documented in the PACS or zVision Dashboard. Electronically Signed   By: Dorise Bullion III M.D   On: 11/06/2016 09:01    Medications: I have reviewed the patient's current medications.  Assessment/Plan: 1. Heme positive stool and anemia. Patient has PAF needs to be on chronic anticoagulation. He came in over anticoagulated  on Coumadin and this is currently on hold. I think it is reasonable to go ahead and doing EGD particularly since he has had a bleeding gastric AVM require treatment in the past. This is set up for tomorrow morning at 8 o'clock utilizing MAC by Dr. Penelope Coop. If this is negative we will need to decide whether or not to do a colonoscopy which has much more risk associated. He just had a colonoscopy 2011 in it may be reasonable to do G.I. bleeding scan if his upper endoscopy fails reveal a source of bleeding. Have discussed this in detail again with the patient with his daughter present. Procedure scheduled 8 AM in the morning.   Kaiah Hosea JR,Jevan Gaunt L 11/07/2016, 2:02 PM  This note was created using voice recognition software. Minor errors may Have occurred unintentionally.  Pager: [phone] If no answer or after hours call [phone]

## 2016-11-07 NOTE — Progress Notes (Signed)
ANTICOAGULATION CONSULT NOTE - [place] for warfarin Indication: atrial fibrillation  Allergies  Allergen Reactions  . Vicodin [Hydrocodone-Acetaminophen] Nausea And Vomiting  . Hydrocodone Nausea Only   Patient Measurements: Height: 5\' 7"  (170.2 cm) Weight: 247 lb 1.6 oz (112.1 kg) IBW/kg (Calculated) : 66.1  Vital Signs: Temp: 98 F (36.7 C) (04/01 0448) Temp Source: Oral (04/01 0448) BP: 121/70 (04/01 0448) Pulse Rate: 61 (04/01 0448)  Labs:  Recent Labs  11/04/16 0953  11/05/16 0236  11/06/16 0209 11/06/16 1246 11/07/16 0608  HGB  --   < > 7.6*  --  7.5*  --  7.5*  HCT  --   --  24.0*  --  24.0*  --  22.7*  PLT  --   --  181  --  182  --  161  LABPROT 49.3*  --  42.0*  --  30.2*  --  24.8*  INR 5.41*  --  4.26*  --  2.81  --  2.20  CREATININE 3.28*  < > 3.53*  < > 4.01* 3.50* 2.83*  TROPONINI 0.20*  --   --   --   --   --   --   < > = values in this interval not displayed.  Estimated Creatinine Clearance: 23.6 mL/min (A) (by C-G formula based on SCr of 2.83 mg/dL (H)).  Medical History: Past Medical History:  Diagnosis Date  . Coronary artery disease   . Diverticulitis   . Gout   . Hyperlipidemia   . MI (myocardial infarction)   . Nonischemic cardiomyopathy (HCC)    mild  . OSA on CPAP   . Permanent atrial fibrillation ([place])   . Pulmonary hypertension    Medications:  Facility-Administered Medications Prior to Admission  Medication Dose Route Frequency Provider Last Rate Last Dose  . betamethasone acetate-betamethasone sodium phosphate (CELESTONE) injection 12 mg  12 mg Intramuscular Once Edrick Kins, DPM       Prescriptions Prior to Admission  Medication Sig Dispense Refill Last Dose  . acetaminophen (TYLENOL) 650 MG CR tablet Take 650 mg by mouth every 8 (eight) hours as needed for pain.   Past Month at Unknown time  . allopurinol (ZYLOPRIM) 300 MG tablet Take 300 mg by mouth daily.   11/02/2016 at Unknown time  .  cephALEXin (KEFLEX) 500 MG capsule Take 500 mg by mouth 3 (three) times daily.   11/02/2016 at Unknown time  . COLCRYS 0.6 MG tablet Take 0.6 mg by mouth as needed (flareups).    Past Month at Unknown time  . Docusate Calcium (STOOL SOFTENER PO) Take 2 tablets by mouth at bedtime.    11/02/2016 at Unknown time  . dutasteride (AVODART) 0.5 MG capsule Take 0.5 mg by mouth daily.   11/02/2016 at Unknown time  . fenofibrate 160 MG tablet Take 160 mg by mouth daily.   11/02/2016 at Unknown time  . furosemide (LASIX) 20 MG tablet Take 40 mg by mouth daily. swelling   11/02/2016 at Unknown time  . irbesartan (AVAPRO) 150 MG tablet Take 150 mg by mouth daily.   11/02/2016 at Unknown time  . loratadine (CLARITIN) 10 MG tablet Take 10 mg by mouth daily.   11/02/2016 at Unknown time  . mirtazapine (REMERON) 15 MG tablet Take 15 mg by mouth at bedtime.   11/02/2016 at Unknown time  . Multiple Vitamins-Minerals (PRESERVISION AREDS 2) CAPS Take 1 capsule by mouth daily.   11/02/2016 at Unknown time  .  nitroGLYCERIN (NITROSTAT) 0.4 MG SL tablet Place 0.4 mg under the tongue every 5 (five) minutes as needed. Chest pain   prn  . Omega-3 Fatty Acids (OMEGA-3 PO) Take 1 capsule by mouth 2 (two) times daily.   11/02/2016 at Unknown time  . potassium chloride SA (K-DUR,KLOR-CON) 20 MEQ tablet Take 40 mEq by mouth daily.    11/02/2016 at Unknown time  . rOPINIRole (REQUIP) 3 MG tablet Take 1 tablet by mouth daily. Take 1 tab daily   11/02/2016 at Unknown time  . Tamsulosin HCl (FLOMAX) 0.4 MG CAPS Take 0.4 mg by mouth daily after breakfast.   11/02/2016 at Unknown time  . traMADol (ULTRAM) 50 MG tablet Take 50 mg by mouth 4 (four) times daily. For pain    11/02/2016 at Unknown time  . warfarin (COUMADIN) 5 MG tablet Take 1 tablet by mouth daily. Take 1 tab daily except Tuesday take half tab   11/02/2016 at Unknown time   Scheduled:  . allopurinol  200 mg Oral Daily  . cephALEXin  250 mg Oral Q8H  . Chlorhexidine Gluconate Cloth  6  each Topical Q0600  . dutasteride  0.5 mg Oral Daily  . ferumoxytol  510 mg Intravenous Weekly  . furosemide  80 mg Intravenous BID  . insulin aspart  0-9 Units Subcutaneous TID WC  . mouth rinse  15 mL Mouth Rinse BID  . mupirocin ointment  1 application Nasal BID  . patiromer  8.4 g Oral Daily  . rOPINIRole  3 mg Oral QHS  . sodium chloride flush  3 mL Intravenous Q12H  . tamsulosin  0.4 mg Oral QPC breakfast   Assessment: 59 yoM on warfarin PTA for AFib presents with elevated INR of 5.06 now s/p vitamin K on 3/29. Warfarin has been held this admit in setting of elevated INR, FOBT positive, and low Hgb. INR has now trended down to 2.20. Per Dr. Doyle Askew, continue to hold warfarin today as patient may undergo endoscopy tomorrow with GI.  Home warfarin dose: 5mg /day except take 2.5mg  Tues  Goal of Therapy:  INR 2-3 Monitor platelets by anticoagulation protocol: Yes   Plan:  -Hold warfarin again tonight -Watch S/Sx bleeding closely -Daily PT/INR  Arrie Senate, PharmD PGY-1 Pharmacy Resident Pager: [phone] 11/07/2016

## 2016-11-07 NOTE — Progress Notes (Signed)
Physical Therapy Treatment Note  SATURATION QUALIFICATIONS: (This note is used to comply with regulatory documentation for home oxygen)  Patient Saturations on Room Air at Rest = 97%  Patient Saturations on Room Air while Ambulating = 86%  Patient Saturations on 2 Liters of oxygen while Ambulating = 93%  Please briefly explain why patient needs home oxygen: Patient requires supplemental oxygen to maintain oxygen saturations at acceptable, safe levels with physical activity.  Roney Marion, [place]  Acute Rehabilitation Services Pager [phone] Office [phone]

## 2016-11-07 NOTE — Progress Notes (Signed)
Pt called RN to room at 6.40pm, IV site found leaking, stat IV order put there for new IV, waiting for them to fix it. RN tried to fix it but still leaking

## 2016-11-07 NOTE — Progress Notes (Addendum)
Blood transfusion still running, pt is being irritating of transfusion. Pt is itching since morning since I received from [facility] throughout the day, Vistaril IM provided, next dose is not due until 9pm, will continue to monitor

## 2016-11-07 NOTE — Consult Note (Signed)
[place] [facility] Consult   Reason for Consult:  Confusion and hallucinations Referring Physician:  Dr. Doyle Askew Patient Identification: OMID DEARDORFF MRN:  [id number] Principal Diagnosis: Acute delirium Diagnosis:   Patient Active Problem List   Diagnosis Date Noted  . Acute confusion [R41.0]   . Acute hyperkalemia [E87.5]   . Secondary hypercoagulable state ([place]) [D68.69]   . Transaminitis [R74.0]   . PAH (pulmonary artery hypertension) [I27.21]   . ARF (acute renal failure) ([place]) [N17.9] 11/03/2016  . Chronic diastolic heart failure ([place]) [I50.32] 09/09/2015  . Poor short term memory [R41.3] 11/05/2013  . Fatigue [R53.83] 10/13/2011  . Dyspnea on exertion [R06.09] 10/13/2011  . Atrial fibrillation, persistent, slow VR [I48.1] 10/13/2011  . Pacemaker single chamber, [employer], 2013 [Z95.0] 10/13/2011  . Restless leg syndrome [G25.81] 10/13/2011  . Chronic anticoagulation [Z79.01] 10/13/2011  . Nonischemic cardiomyopathy - coronaries by angiography 2010 [I42.8] 10/13/2011  . Sleep apnea, on C-Pap [G47.30] 10/13/2011  . Tremor, hereditary, benign [G25.0] 10/13/2011  . Depression [F32.9] 10/13/2011  . Pulmonary hypertension [I27.20] 10/13/2011  . MRSA (methicillin resistant Staphylococcus aureus) carrier [Z22.322] 10/13/2011  . BPH (benign prostatic hyperplasia) [N40.0] 10/13/2011    Total Time spent with patient: 30 minutes  Subjective:   [name] is a 83 y.o. male patient admitted with generalized weakness and confusion.  HPI:  Patient is 83 year old Caucasian married man who was admitted due to generalized weakness and found to be multiple health issues including acute renal failure superimposed on chronic kidney disease.  Consult was called because patient is having confusion, hallucination, agitation and his been very restless.  Patient is a poor historian and most of the information was obtained from HER-2 daughter who were sitting next to  him.  After the patient has history of depression but lately he has been noncompliant with his Lexapro and Zoloft.  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  He refused to get help from his daughter who is a Equities trader.  Upon admission he had a abnormal kidney function but slowly improving.  As per staff he has been very confused, irritable and paranoid and irritable.  The symptoms are waxes nd wane.  Recently he was started on Requip but currently has not taken any Lexapro or Zoloft.  As per his daughter patient denies any history of suicidal attempt or any psychiatric inpatient treatment.  His psychiatric medication is managed by his primary care doctor.  Patient admitted that he's been depressed and anxious but did not mention by he stopped taking the Lexapro.  Continue supportive historian and do not remember the details.  He knows that he is in the hospital but he does not remember daytime.  He denies any suicidal thoughts or homicidal thoughts but remained very confused and easily irritable.  He denies any hallucination but appears confused during the conversation.  Past Psychiatric History: Reviewed.  Risk to Self: Is patient at risk for suicide?: No Risk to Others:   Prior Inpatient Therapy:   Prior Outpatient Therapy:    Past Medical History:  Past Medical History:  Diagnosis Date  . Coronary artery disease   . Diverticulitis   . Gout   . Hyperlipidemia   . MI (myocardial infarction)   . Nonischemic cardiomyopathy (HCC)    mild  . OSA on CPAP   . Permanent atrial fibrillation ([place])   . Pulmonary hypertension     Past Surgical History:  Procedure Laterality  Date  . CARDIAC CATHETERIZATION  11/07/2008   nonischemic cardiomyopathy,pulmonary hypertension  . COLOSTOMY    . COLOSTOMY REVERSAL    . CORONARY ANGIOPLASTY  06/01/1999   successful to ostium of the first diagonal  . ESOPHAGOGASTRODUODENOSCOPY N/A 08/22/2013   Procedure:  ESOPHAGOGASTRODUODENOSCOPY (EGD);  Surgeon: [name], MD;  Location: [facility] ENDOSCOPY;  Service: Endoscopy;  Laterality: N/A;  h/p in file cabinet, jackie  . ESOPHAGOGASTRODUODENOSCOPY N/A 11/05/2014   Procedure: ESOPHAGOGASTRODUODENOSCOPY (EGD);  Surgeon: Clarene Essex, MD;  Location: [facility] ENDOSCOPY;  Service: Endoscopy;  Laterality: N/A;  . HOT HEMOSTASIS N/A 11/05/2014   Procedure: HOT HEMOSTASIS (ARGON PLASMA COAGULATION/BICAP);  Surgeon: Clarene Essex, MD;  Location: [facility] ENDOSCOPY;  Service: Endoscopy;  Laterality: N/A;  . NM MYOCAR PERF WALL MOTION  11/24/2007   normal  . PERMANENT PACEMAKER INSERTION  10/04/2012   [employer]  . PERMANENT PACEMAKER INSERTION N/A 10/12/2011   Procedure: PERMANENT PACEMAKER INSERTION;  Surgeon: [name], MD;  Location: [place] CATH LAB;  Service: Cardiovascular;  Laterality: N/A;  . REPLACEMENT TOTAL KNEE    . SAVORY DILATION N/A 08/22/2013   Procedure: SAVORY DILATION;  Surgeon: [name], MD;  Location: [facility] ENDOSCOPY;  Service: Endoscopy;  Laterality: N/A;  . SHOULDER SURGERY    . US ECHOCARDIOGRAPHY  02/01/2011   LA is mod-severely dilated,AOV & root sclerotic,ca+ AOV leaflets   Family History:  Family History  Problem Relation Age of Onset  . Cancer Mother   . Heart attack Father   . Diabetes Brother    Family Psychiatric  History: Reviewed. Social History:  History  Alcohol Use No     History  Drug Use No    Social History   Social History  . Marital status: Married    Spouse name: N/A  . Number of children: N/A  . Years of education: N/A   Social History Main Topics  . Smoking status: Never Smoker  . Smokeless tobacco: Never Used  . Alcohol use No  . Drug use: No  . Sexual activity: Not Asked   Other Topics Concern  . None   Social History Narrative  . None   Additional Social History:    Allergies:   Allergies  Allergen Reactions  . Vicodin [Hydrocodone-Acetaminophen] Nausea And Vomiting  . Hydrocodone Nausea Only     Labs:  Results for orders placed or performed during the hospital encounter of 11/03/16 (from the past 48 hour(s))  Glucose, capillary     Status: None   Collection Time: 11/05/16  4:49 PM  Result Value Ref Range   Glucose-Capillary 98 65 - 99 mg/dL  Glucose, capillary     Status: Abnormal   Collection Time: 11/05/16  9:20 PM  Result Value Ref Range   Glucose-Capillary 110 (H) 65 - 99 mg/dL  Protime-INR     Status: Abnormal   Collection Time: 11/06/16  2:09 AM  Result Value Ref Range   Prothrombin Time 30.2 (H) 11.4 - 15.2 seconds   INR 2.81   CBC     Status: Abnormal   Collection Time: 11/06/16  2:09 AM  Result Value Ref Range   WBC 7.5 4.0 - 10.5 K/uL   RBC 2.37 (L) 4.22 - 5.81 MIL/uL   Hemoglobin 7.5 (L) 13.0 - 17.0 g/dL   HCT 24.0 (L) 39.0 - 52.0 %   MCV 101.3 (H) 78.0 - 100.0 fL   MCH 31.6 26.0 - 34.0 pg   MCHC 31.3 30.0 - 36.0 g/dL   RDW  17.0 (H) 11.5 - 15.5 %   Platelets 182 150 - 400 K/uL  Comprehensive metabolic panel     Status: Abnormal   Collection Time: 11/06/16  2:09 AM  Result Value Ref Range   Sodium 135 135 - 145 mmol/L   Potassium 5.5 (H) 3.5 - 5.1 mmol/L   Chloride 103 101 - 111 mmol/L   CO2 23 22 - 32 mmol/L   Glucose, Bld 103 (H) 65 - 99 mg/dL   BUN 104 (H) 6 - 20 mg/dL   Creatinine, Ser 4.01 (H) 0.61 - 1.24 mg/dL   Calcium 8.8 (L) 8.9 - 10.3 mg/dL   Total Protein 6.5 6.5 - 8.1 g/dL   Albumin 3.1 (L) 3.5 - 5.0 g/dL   AST 433 (H) 15 - 41 U/L   ALT 222 (H) 17 - 63 U/L   Alkaline Phosphatase 51 38 - 126 U/L   Total Bilirubin 1.7 (H) 0.3 - 1.2 mg/dL   GFR calc non Af Amer 13 (L) >60 mL/min   GFR calc Af Amer 15 (L) >60 mL/min    Comment: (NOTE) The eGFR has been calculated using the CKD EPI equation. This calculation has not been validated in all clinical situations. eGFR's persistently <60 mL/min signify possible Chronic Kidney Disease.    Anion gap 9 5 - 15  Blood gas, arterial     Status: Abnormal   Collection Time: 11/06/16  6:45 AM   Result Value Ref Range   O2 Content 2.0 L/min   Delivery systems NASAL CANNULA    pH, Arterial 7.327 (L) 7.350 - 7.450   pCO2 arterial 44.7 32.0 - 48.0 mmHg   pO2, Arterial 92.8 83.0 - 108.0 mmHg   Bicarbonate 22.8 20.0 - 28.0 mmol/L   Acid-base deficit 2.3 (H) 0.0 - 2.0 mmol/L   O2 Saturation 96.6 %   Patient temperature 98.6    Collection site LEFT RADIAL    Drawn by [id number]    Sample type ARTERIAL DRAW    Allens test (pass/fail) PASS PASS  Ammonia     Status: Abnormal   Collection Time: 11/06/16  7:12 AM  Result Value Ref Range   Ammonia 58 (H) 9 - 35 umol/L  Glucose, capillary     Status: None   Collection Time: 11/06/16  8:06 AM  Result Value Ref Range   Glucose-Capillary 95 65 - 99 mg/dL  Glucose, capillary     Status: Abnormal   Collection Time: 11/06/16 11:31 AM  Result Value Ref Range   Glucose-Capillary 142 (H) 65 - 99 mg/dL  Basic metabolic panel     Status: Abnormal   Collection Time: 11/06/16 12:46 PM  Result Value Ref Range   Sodium 135 135 - 145 mmol/L   Potassium 5.6 (H) 3.5 - 5.1 mmol/L   Chloride 103 101 - 111 mmol/L   CO2 21 (L) 22 - 32 mmol/L   Glucose, Bld 119 (H) 65 - 99 mg/dL   BUN 107 (H) 6 - 20 mg/dL   Creatinine, Ser 3.50 (H) 0.61 - 1.24 mg/dL   Calcium 8.6 (L) 8.9 - 10.3 mg/dL   GFR calc non Af Amer 15 (L) >60 mL/min   GFR calc Af Amer 17 (L) >60 mL/min    Comment: (NOTE) The eGFR has been calculated using the CKD EPI equation. This calculation has not been validated in all clinical situations. eGFR's persistently <60 mL/min signify possible Chronic Kidney Disease.    Anion gap 11 5 - 15  Glucose, capillary  Status: Abnormal   Collection Time: 11/06/16  3:51 PM  Result Value Ref Range   Glucose-Capillary 138 (H) 65 - 99 mg/dL  Glucose, capillary     Status: Abnormal   Collection Time: 11/06/16  9:50 PM  Result Value Ref Range   Glucose-Capillary 111 (H) 65 - 99 mg/dL  Protime-INR     Status: Abnormal   Collection Time: 11/07/16   6:08 AM  Result Value Ref Range   Prothrombin Time 24.8 (H) 11.4 - 15.2 seconds   INR 2.20   CBC     Status: Abnormal   Collection Time: 11/07/16  6:08 AM  Result Value Ref Range   WBC 5.4 4.0 - 10.5 K/uL   RBC 2.28 (L) 4.22 - 5.81 MIL/uL   Hemoglobin 7.5 (L) 13.0 - 17.0 g/dL   HCT 22.7 (L) 39.0 - 52.0 %   MCV 99.6 78.0 - 100.0 fL   MCH 32.9 26.0 - 34.0 pg   MCHC 33.0 30.0 - 36.0 g/dL   RDW 17.3 (H) 11.5 - 15.5 %   Platelets 161 150 - 400 K/uL  Renal function panel     Status: Abnormal   Collection Time: 11/07/16  6:08 AM  Result Value Ref Range   Sodium 136 135 - 145 mmol/L   Potassium 3.9 3.5 - 5.1 mmol/L   Chloride 101 101 - 111 mmol/L   CO2 22 22 - 32 mmol/L   Glucose, Bld 87 65 - 99 mg/dL   BUN 96 (H) 6 - 20 mg/dL   Creatinine, Ser 2.83 (H) 0.61 - 1.24 mg/dL   Calcium 9.0 8.9 - 10.3 mg/dL   Phosphorus 4.1 2.5 - 4.6 mg/dL   Albumin 3.2 (L) 3.5 - 5.0 g/dL   GFR calc non Af Amer 19 (L) >60 mL/min   GFR calc Af Amer 22 (L) >60 mL/min    Comment: (NOTE) The eGFR has been calculated using the CKD EPI equation. This calculation has not been validated in all clinical situations. eGFR's persistently <60 mL/min signify possible Chronic Kidney Disease.    Anion gap 13 5 - 15  Glucose, capillary     Status: Abnormal   Collection Time: 11/07/16 10:08 AM  Result Value Ref Range   Glucose-Capillary 132 (H) 65 - 99 mg/dL  Glucose, capillary     Status: Abnormal   Collection Time: 11/07/16 12:19 PM  Result Value Ref Range   Glucose-Capillary 134 (H) 65 - 99 mg/dL    Current Facility-Administered Medications  Medication Dose Route Frequency Provider Last Rate Last Dose  . albuterol (PROVENTIL) (2.5 MG/3ML) 0.083% nebulizer solution 2.5 mg  2.5 mg Nebulization Q2H PRN Theodis Blaze, MD      . allopurinol (ZYLOPRIM) tablet 200 mg  200 mg Oral Daily Theodis Blaze, MD   200 mg at 11/07/16 1188  . bisacodyl (DULCOLAX) suppository 10 mg  10 mg Rectal Daily PRN Vianne Bulls, MD   10  mg at 11/05/16 2327  . cephALEXin (KEFLEX) capsule 250 mg  250 mg Oral Q8H Rhetta Mura Schorr, NP   250 mg at 11/07/16 0552  . Chlorhexidine Gluconate Cloth 2 % PADS 6 each  6 each Topical Q0600 Theodis Blaze, MD   6 each at 11/07/16 [id number]  . dutasteride (AVODART) capsule 0.5 mg  0.5 mg Oral Daily Theodis Blaze, MD   0.5 mg at 11/07/16 0837  . ferumoxytol (FERAHEME) 510 mg in sodium chloride 0.9 % 100 mL IVPB  510 mg Intravenous  Weekly Jamal Maes, MD   510 mg at 11/06/16 1418  . furosemide (LASIX) injection 80 mg  80 mg Intravenous BID Mihai Croitoru, MD   80 mg at 11/07/16 1034  . guaiFENesin (ROBITUSSIN) 100 MG/5ML solution 100 mg  5 mL Oral Q4H PRN Theodis Blaze, MD   100 mg at 11/06/16 0211  . hydrOXYzine (VISTARIL) injection 25 mg  25 mg Intramuscular Q6H PRN Theodis Blaze, MD   25 mg at 11/07/16 0837  . insulin aspart (novoLOG) injection 0-9 Units  0-9 Units Subcutaneous TID WC Theodis Blaze, MD   1 Units at 11/06/16 1759  . MEDLINE mouth rinse  15 mL Mouth Rinse BID Theodis Blaze, MD   15 mL at 11/07/16 1036  . mirtazapine (REMERON) tablet 15 mg  15 mg Oral QHS PRN Theodis Blaze, MD      . mupirocin ointment (BACTROBAN) 2 % 1 application  1 application Nasal BID Theodis Blaze, MD   1 application at 02/54/27 1035  . patiromer Daryll Drown) packet 8.4 g  8.4 g Oral Daily Jamal Maes, MD   8.4 g at 11/06/16 1000  . rOPINIRole (REQUIP) tablet 3 mg  3 mg Oral QHS Vianne Bulls, MD   3 mg at 11/07/16 0011  . sodium chloride flush (NS) 0.9 % injection 3 mL  3 mL Intravenous Q12H Theodis Blaze, MD   3 mL at 11/07/16 1036  . tamsulosin (FLOMAX) capsule 0.4 mg  0.4 mg Oral QPC breakfast Theodis Blaze, MD   0.4 mg at 11/07/16 0837  . traMADol (ULTRAM) tablet 50-100 mg  50-100 mg Oral Q6H PRN Vianne Bulls, MD   50 mg at 11/07/16 0011    Musculoskeletal: Strength & Muscle Tone: decreased Gait & Station: unsteady Patient leans: N/A  Psychiatric Specialty Exam: Physical Exam  Review of  Systems  Constitutional: Positive for malaise/fatigue.  Skin: Negative for itching and rash.  Neurological: Positive for weakness.  Psychiatric/Behavioral: Positive for memory loss.    Blood pressure (!) 121/53, pulse 60, temperature 97.5 F (36.4 C), temperature source Oral, resp. rate 18, height '5\' 7"'  (1.702 m), weight 110.5 kg (243 lb 11.2 oz), SpO2 100 %.Body mass index is 38.17 kg/m.  General Appearance: Disheveled  Eye Contact:  Poor  Speech:  Slow and Slurred  Volume:  Decreased  Mood:  Irritable  Affect:  Labile  Thought Process:  Irrelevant  Orientation:  NA  Thought Content:  Logical and Confused at times  Suicidal Thoughts:  No  Homicidal Thoughts:  No  Memory:  Immediate;   Fair Recent;   Poor Remote;   Fair  Judgement:  Fair  Insight:  Lacking  Psychomotor Activity:  Restlessness  Concentration:  Concentration: Poor and Attention Span: Poor  Recall:  Poor  Fund of Knowledge:  Fair  Language:  Poor  Akathisia:  No  Handed:  Right  AIMS (if indicated):     Assets:  Housing  ADL's:  Impaired  Cognition:  Impaired,  Mild  Sleep:        Treatment Plan Summary: Medication management Restart Lexapro 5 mg daily to help the depression.  Consider giving Seroquel 25 mg every 6 when necessary for agitation and hallucination.  Monitor closely side effects.  Call consumption liaison services in the morning for continued to of care.  Disposition: Patient does not meet criteria for psychiatric inpatient admission. Supportive therapy provided about ongoing stressors.  ARFEEN,SYED T., MD 11/07/2016 12:22 PM

## 2016-11-07 NOTE — Progress Notes (Signed)
[facility] Rounding Note  Subjective/Interval History:  Delerium seems to have cleared Still odd conversation where he seems to prefer gestures to words when asked a question On a positive note, UOP recorded is excellent and creatinine is starting to fall Dr. Loletha Grayer and I discussed possible transfusion - he is OK with that  Objective Vital signs in last 24 hours: Vitals:   11/06/16 2303 11/06/16 2320 11/07/16 0448 11/07/16 0900  BP: (!) 108/55  121/70 (!) 121/53  Pulse: (!) 59 62 61 60  Resp: 20 18 18 18   Temp: 98.6 F (37 C)  98 F (36.7 C) 97.5 F (36.4 C)  TempSrc: Oral  Oral Oral  SpO2: 99% 99% 100% 100%  Weight:   112.1 kg (247 lb 1.6 oz) 110.5 kg (243 lb 11.2 oz)  Height:   5\' 7"  (1.702 m)    Weight change: -4.491 kg (-9 lb 14.4 oz)  Intake/Output Summary (Last 24 hours) at 11/07/16 1310 Last data filed at 11/07/16 1300  Gross per 24 hour  Intake              597 ml  Output             3475 ml  Net            -2878 ml   Physical Exam:  Blood pressure (!) 121/53, pulse 60, temperature 97.5 F (36.4 C), temperature source Oral, resp. rate 18, height 5\' 7"  (1.702 m), weight 110.5 kg (243 lb 11.2 oz), SpO2 100 %.  Lying in bed on left side.  Daughter in with him Didn't really want to talk but did answer questions + JVD Bilateral crackles still 1/3 up Irregularly irregular rhythm and no rub. Ht sounds distant Obese NT abd Edema 1+-2+  edema on right lower extremity, trace+ on left RLE wrapped with ACE wrap    Recent Labs Lab 11/03/16 2239  11/04/16 0953 11/04/16 1233 11/05/16 0236 11/05/16 1206 11/06/16 0209 11/06/16 1246 11/07/16 0608  NA 136  < > 135 135 135 136 135 135 136  K 5.8*  < > 6.3* 6.2* 6.0* 5.8* 5.5* 5.6* 3.9  CL 102  < > 104 104 102 102 103 103 101  CO2 23  < > 19* 19* 21* 24 23 21* 22  GLUCOSE 109*  < > 111* 122* 101* 103* 103* 119* 87  BUN 85*  < > 88* 89* 93* 98* 104* 107* 96*  CREATININE 3.22*  < > 3.28* 3.39* 3.53* 3.80* 4.01* 3.50* 2.83*   CALCIUM 9.2  < > 9.1 9.1 9.0 8.7* 8.8* 8.6* 9.0  PHOS 6.1*  --   --   --   --   --   --   --  4.1  < > = values in this interval not displayed.   Recent Labs Lab 11/04/16 0313 11/05/16 0236 11/06/16 0209 11/07/16 0608  AST 198* 176* 433*  --   ALT 101* 109* 222*  --   ALKPHOS 50 52 51  --   BILITOT 1.0 1.2 1.7*  --   PROT 6.6 6.7 6.5  --   ALBUMIN 3.0* 3.0* 3.1* 3.2*    Recent Labs Lab 11/03/16 1640  LIPASE 16    Recent Labs Lab 11/04/16 0313 11/05/16 0236 11/06/16 0209 11/07/16 0608  WBC 4.9 6.6 7.5 5.4  HGB 7.6* 7.6* 7.5* 7.5*  HCT 24.5* 24.0* 24.0* 22.7*  MCV 102.9* 102.1* 101.3* 99.6  PLT 173 181 182 161    Recent Labs Lab  11/03/16 2239 11/04/16 0313 11/04/16 0953  TROPONINI 0.28* 0.27* 0.20*     Recent Labs Lab 11/06/16 1131 11/06/16 1551 11/06/16 2150 11/07/16 1008 11/07/16 1219  GLUCAP 142* 138* 111* 132* 134*   Lab Results  Component Value Date   INR 2.20 11/07/2016   INR 2.81 11/06/2016   INR 4.26 (HH) 11/05/2016       Recent Labs Lab 11/04/16 0953  IRON 23*  TIBC 532*  FERRITIN 49   Studies/Results: Ct Head Wo Contrast  Result Date: 11/06/2016 CLINICAL DATA:  Confusion. EXAM: CT HEAD WITHOUT CONTRAST TECHNIQUE: Contiguous axial images were obtained from the base of the skull through the vertex without intravenous contrast. COMPARISON:  August 20, 2014 FINDINGS: Brain: No subdural, epidural, or subarachnoid hemorrhage. Low-attenuation in the left cerebellar hemisphere on series 3, image 9 is identified. There is streak artifact in this region. There is no associated mass effect on the fourth ventricle. The cerebellum, brainstem, and basal cisterns are otherwise normal. Ventricles and sulci are mildly prominent but stable. White matter changes are moderate and similar in the interval. A lacunar infarct in the left basal ganglia is unchanged. Low-attenuation through the left temporal lobe is thought to be due to streak artifact. No  other sites of ischemia or infarct are identified. No mass, mass effect, or midline shift. Vascular: Calcified atherosclerosis is seen in the intracranial carotid arteries. Skull: Normal. Negative for fracture or focal lesion. Sinuses/Orbits: No acute finding. Other: None. IMPRESSION: 1. There is low-attenuation in the left cerebellar hemisphere. The finding could be at least partially artifactual, due to streak artifact and beam hardening. However, an age-indeterminate infarct is not excluded on this study. Recommend clinical correlation. An MRI could further assess if clinically warranted. 2. No bleed identified.  No other acute abnormalities. These results will be called to the ordering clinician or representative by the Radiologist Assistant, and communication documented in the PACS or zVision Dashboard. Electronically Signed   By: Dorise Bullion III M.D   On: 11/06/2016 09:01   Medications:  . allopurinol  200 mg Oral Daily  . cephALEXin  250 mg Oral Q8H  . Chlorhexidine Gluconate Cloth  6 each Topical Q0600  . dutasteride  0.5 mg Oral Daily  . ferumoxytol  510 mg Intravenous Weekly  . furosemide  80 mg Intravenous BID  . insulin aspart  0-9 Units Subcutaneous TID WC  . mouth rinse  15 mL Mouth Rinse BID  . mupirocin ointment  1 application Nasal BID  . patiromer  8.4 g Oral Daily  . rOPINIRole  3 mg Oral QHS  . sodium chloride flush  3 mL Intravenous Q12H  . tamsulosin  0.4 mg Oral QPC breakfast    Background PMH HTN, CAD, chronic A fib on coumadin, OSA on CPAP, pulmonary HTN, nonischemic CM (EF 55-60%) s/p pacemaker, obesity, gout, and BPH. Presented to PCP's office on 11/03/16 c/o dyspnea, increasing LE edema, generalized weakness, episodes of bleeding from varicose veins. Sent to the [facility] -> bradycardia, hypoxia, anemia (Hgb of 7.6), hyperkalemia, supratherapeutic coumadin with INR of 5.06, and AKI with Scr of 3.35.   Last available creatinine in EPIC labs was from 08/2014 and was 1.3 at  that time.  Dr. [name] notes a creatinine from [facility] from 10/05/16 of 1.5. Was on irbesartan and potassium supplements PTA ->held on admission. On bactrim as out pt and recd 1 dose of Bactrim in pt but this was stopped.    Assessment/Plan: 1. AKI - in setting  of decompensated R CHF, ABLA, concomitant ARB therapy. Had bactrim which was stopped.  Likely ischemic ATN. Getting better now and making urine! Continue to hold ARB and KCl. Keep lasix at same dose 2. Hyperkalemia- due to #1 and K supplements with ARB therapy. Persistent after 3 doses kayexalate. Added patiromer on 3/30 - K has normalized and with improving renal fx will d/c patiromer. Remains on renal diet  (to get K restriction) 3. Acute on chronic diastolic CHF- 8lb weight gain outpt with volume overload. ECHO shows stable LVF, signs of R heart dysfunction.  Poor response to lasix 40 BID - cards  increased to 80 BID. With drop in creatinine is diuresing now on that dose. Still wet on exam w/crackles and edema - keep that same dose for now 4. ABLA - related to [place] and varicose veins.  TSat only 4%. Gave dose of Feraheme. Transfuse 1 unit today. (Dr. [name] and I discussed) 5. Supra-therapeutic INR-  Coumadin on hold. INR coming down 6. CAD with nonischemic CM  ^ troponin, per Cardiology 7. Abnormal LFT's  Still marked elevation in transaminases as of yesterday. Abdominal US revealed possible fatty liver.  Also had thickening of gallbladder wall. Check ammonia 8. Encephalopathy - less delirious. Answers questions appropriately but pulls covers up over head and turns away during conversation. Psyche has seen.  Lexapro restarted.  9. Scalp abscess - on keflex. Report from dermatologist - MRSA. Change to doxy.   Jamal Maes, MD [facility] [phone] pager 11/07/2016, 1:10 PM

## 2016-11-07 NOTE — Progress Notes (Signed)
Pt states he will wear his own CPAP from home that he has at bedside.

## 2016-11-07 NOTE — Evaluation (Signed)
Physical Therapy Evaluation Patient Details Name: [name] MRN: [id number] DOB: [birth year] Today's Date: 11/07/2016   History of Present Illness  [name] has a PMH significant for HTN, CAD, chronic A fib on coumadin, OSA on CPAP, pulmonary HTN, nonischemic CMP (EF 55-60%) s/p pacemaker, obesity, gout, and BPH who presented to his PCP's office on 11/03/16 c/o dyspnea, increasing lower extremity edema, and generalized weakness.  He was sent to the [facility] and noted to have bradycardia, hypoxia, anemia (Hgb of 7.6), hyperkalemia, supratherapeutic coumadin with INR of 5.06, and AKI with Scr of 3.35.   Clinical Impression   Pt admitted with above diagnosis. Pt currently with functional limitations due to the deficits listed below (see PT Problem List). Managing at minguard level; using RW for more stability with amb today; Desatted with walk on room air;  Pt will benefit from skilled PT to increase their independence and safety with mobility to allow discharge to the venue listed below.       Follow Up Recommendations Home health PT    Equipment Recommendations  3in1 (PT);Other (comment) (Not sure if pt already has rollator or basic RW)  Supplemental O2   Recommendations for Other Services OT consult (ADLs and energy conservation)     Precautions / Restrictions Precautions Precautions: Fall;Other (comment) Precaution Comments: Contact      Mobility  Bed Mobility               General bed mobility comments: Pt sitting EOB upon PT arrival  Transfers Overall transfer level: Needs assistance Equipment used: Rolling walker (2 wheeled) Transfers: Sit to/from Stand Sit to Stand: Min guard         General transfer comment: Cues for safety and hand placement  Ambulation/Gait Ambulation/Gait assistance: Min guard Ambulation Distance (Feet): 200 Feet Assistive device: Rolling walker (2 wheeled) Gait Pattern/deviations: Step-through pattern;Trunk flexed     General Gait  Details: Cues to self-monitor for activity tolerance, as well as for RW proximity and upright posture; See other note of this date for O2 qualifying numbers  Stairs            Wheelchair Mobility    Modified Rankin (Stroke Patients Only)       Balance                                             Pertinent Vitals/Pain Pain Assessment: Faces Faces Pain Scale: Hurts a little bit Pain Location: Pain in toes while donning bedroom shoes Pain Descriptors / Indicators: Grimacing Pain Intervention(s): Monitored during session    Home Living Family/patient expects to be discharged to:: Private residence Living Arrangements: Spouse/significant other Available Help at Discharge: Family;Available PRN/intermittently (Pt is caregiver for wife) Type of Home: House Home Access: Ramped entrance     Home Layout: One level Home Equipment: Walker - 2 wheels      Prior Function Level of Independence: Independent with assistive device(s)         Comments: RW prn     Hand Dominance        Extremity/Trunk Assessment   Upper Extremity Assessment Upper Extremity Assessment: Generalized weakness    Lower Extremity Assessment Lower Extremity Assessment: Generalized weakness       Communication   Communication: No difficulties  Cognition Arousal/Alertness: Awake/alert Behavior During Therapy: WFL for tasks assessed/performed Overall Cognitive Status: Within Functional Limits for tasks  assessed                                        General Comments      Exercises     Assessment/Plan    PT Assessment Patient needs continued PT services  PT Problem List Decreased strength;Decreased activity tolerance;Decreased balance;Decreased mobility;Decreased coordination;Decreased knowledge of use of DME;Decreased knowledge of precautions;Cardiopulmonary status limiting activity       PT Treatment Interventions DME instruction;Gait  training;Functional mobility training;Therapeutic activities;Therapeutic exercise;Patient/family education    PT Goals (Current goals can be found in the Care Plan section)  Acute Rehab PT Goals Patient Stated Goal: Hopes to get home soon PT Goal Formulation: With patient Time For Goal Achievement: 11/21/16 Potential to Achieve Goals: Good    Frequency Min 3X/week   Barriers to discharge        Co-evaluation               End of Session Equipment Utilized During Treatment: Gait belt;Oxygen Activity Tolerance: Patient tolerated treatment well Patient left: in chair;with call bell/phone within reach;with family/visitor present Nurse Communication: Mobility status PT Visit Diagnosis: Unsteadiness on feet (R26.81)    Time: 6789-3810 PT Time Calculation (min) (ACUTE ONLY): 32 min   Charges:   PT Evaluation $PT Eval Moderate Complexity: 1 Procedure PT Treatments $Gait Training: 8-22 mins   PT G Codes:        [name], PT  Acute Rehabilitation Services Pager [phone] Office [phone]   [name] 11/07/2016, 10:26 AM

## 2016-11-07 NOTE — Progress Notes (Addendum)
Pt has a blood transfusion order, type and screen done, consent has been taken, waiting for the blood bank now  Pt has also complain of itching, waiting for IM Vistaril from pharmacy to be tubed up at this point, will continue to monitor

## 2016-11-07 NOTE — Progress Notes (Signed)
Pt received from 4n, all his morning blood sugar and morning medicine is pending. Daughters are present and are in bed side, Tele monitor is in bed side, pt is stable and alert, make them aware of fall protocol and the policy of our unit, will continue to monitor

## 2016-11-07 NOTE — Progress Notes (Signed)
Blood restarted after IV team give him new IV @7p 

## 2016-11-08 ENCOUNTER — Inpatient Hospital Stay (HOSPITAL_COMMUNITY): Payer: Medicare Other | Admitting: Certified Registered"

## 2016-11-08 ENCOUNTER — Encounter (HOSPITAL_COMMUNITY)
Admission: EM | Disposition: A | Discharge status: Home / Self Care | Payer: Self-pay | Source: Home / Self Care | Attending: Internal Medicine

## 2016-11-08 ENCOUNTER — Encounter (HOSPITAL_COMMUNITY): Payer: Self-pay | Admitting: *Deleted

## 2016-11-08 HISTORY — PX: ESOPHAGOGASTRODUODENOSCOPY: SHX5428

## 2016-11-08 LAB — COMPREHENSIVE METABOLIC PANEL
ALT: 158 U/L — AB (ref 17–63)
AST: 197 U/L — ABNORMAL HIGH (ref 15–41)
Albumin: 3 g/dL — ABNORMAL LOW (ref 3.5–5.0)
Alkaline Phosphatase: 49 U/L (ref 38–126)
Anion gap: 8 (ref 5–15)
BILIRUBIN TOTAL: 2.3 mg/dL — AB (ref 0.3–1.2)
BUN: 79 mg/dL — AB (ref 6–20)
CALCIUM: 9 mg/dL (ref 8.9–10.3)
CO2: 32 mmol/L (ref 22–32)
CREATININE: 1.94 mg/dL — AB (ref 0.61–1.24)
Chloride: 102 mmol/L (ref 101–111)
GFR calc Af Amer: 35 mL/min — ABNORMAL LOW (ref >60)
GFR calc non Af Amer: 30 mL/min — ABNORMAL LOW (ref >60)
Glucose, Bld: 81 mg/dL (ref 65–99)
Potassium: 3.8 mmol/L (ref 3.5–5.1)
Sodium: 142 mmol/L (ref 135–145)
TOTAL PROTEIN: 6.8 g/dL (ref 6.5–8.1)

## 2016-11-08 LAB — CBC
HEMATOCRIT: 25.5 % — AB (ref 39.0–52.0)
Hemoglobin: 8.2 g/dL — ABNORMAL LOW (ref 13.0–17.0)
MCH: 32 pg (ref 26.0–34.0)
MCHC: 32.2 g/dL (ref 30.0–36.0)
MCV: 99.6 fL (ref 78.0–100.0)
Platelets: 168 10*3/uL (ref 150–400)
RBC: 2.56 MIL/uL — AB (ref 4.22–5.81)
RDW: 18.6 % — AB (ref 11.5–15.5)
WBC: 4.7 10*3/uL (ref 4.0–10.5)

## 2016-11-08 LAB — AMMONIA: AMMONIA: 67 umol/L — AB (ref 9–35)

## 2016-11-08 LAB — GLUCOSE, CAPILLARY
GLUCOSE-CAPILLARY: 121 mg/dL — AB (ref 65–99)
GLUCOSE-CAPILLARY: 118 mg/dL — AB (ref 65–99)
Glucose-Capillary: 101 mg/dL — ABNORMAL HIGH (ref 65–99)

## 2016-11-08 LAB — PROTIME-INR
INR: 1.78
Prothrombin Time: 20.9 seconds — ABNORMAL HIGH (ref 11.4–15.2)

## 2016-11-08 SURGERY — EGD (ESOPHAGOGASTRODUODENOSCOPY)
Anesthesia: Monitor Anesthesia Care

## 2016-11-08 MED ORDER — PANTOPRAZOLE SODIUM 40 MG PO TBEC
40.0000 mg | DELAYED_RELEASE_TABLET | Freq: Every day | ORAL | Status: DC
Start: 2016-11-08 — End: 2016-11-10
  Administered 2016-11-08 – 2016-11-10 (×3): 40 mg via ORAL
  Filled 2016-11-08 (×3): qty 1

## 2016-11-08 MED ORDER — PROPOFOL 10 MG/ML IV BOLUS
INTRAVENOUS | Status: DC | PRN
  Administered 2016-11-08: 50 mg via INTRAVENOUS

## 2016-11-08 MED ORDER — HYDROXYZINE HCL 25 MG PO TABS
25.0000 mg | ORAL_TABLET | Freq: Four times a day (QID) | ORAL | Status: DC | PRN
  Administered 2016-11-08 – 2016-11-09 (×3): 25 mg via ORAL
  Filled 2016-11-08 (×3): qty 1

## 2016-11-08 MED ORDER — BUTAMBEN-TETRACAINE-BENZOCAINE 2-2-14 % EX AERO
INHALATION_SPRAY | CUTANEOUS | Status: DC | PRN
  Administered 2016-11-08: 2 via TOPICAL

## 2016-11-08 MED ORDER — FUROSEMIDE 80 MG PO TABS
80.0000 mg | ORAL_TABLET | Freq: Two times a day (BID) | ORAL | Status: DC
  Administered 2016-11-08 – 2016-11-09 (×2): 80 mg via ORAL
  Filled 2016-11-08 (×2): qty 1

## 2016-11-08 MED ORDER — EPHEDRINE SULFATE 50 MG/ML IJ SOLN
INTRAMUSCULAR | Status: DC | PRN
  Administered 2016-11-08 (×4): 10 mg via INTRAVENOUS

## 2016-11-08 MED ORDER — LACTATED RINGERS IV SOLN: INTRAVENOUS | Status: DC | PRN

## 2016-11-08 MED ORDER — PROPOFOL 500 MG/50ML IV EMUL
INTRAVENOUS | Status: DC | PRN
  Administered 2016-11-08: 100 ug/kg/min via INTRAVENOUS

## 2016-11-08 MED ORDER — SODIUM CHLORIDE 0.9 % IV SOLN
INTRAVENOUS | Status: DC
  Administered 2016-11-08: 08:00:00 via INTRAVENOUS

## 2016-11-08 MED ORDER — LIDOCAINE HCL (CARDIAC) 20 MG/ML IV SOLN
INTRAVENOUS | Status: DC | PRN
  Administered 2016-11-08: 100 mg via INTRATRACHEAL

## 2016-11-08 NOTE — Anesthesia Procedure Notes (Signed)
Procedure Name: MAC Date/Time: 11/08/2016 8:22 AM Performed by: Lance Coon Pre-anesthesia Checklist: Patient identified, Emergency Drugs available, Suction available, Patient being monitored and Timeout performed Patient Re-evaluated:Patient Re-evaluated prior to inductionOxygen Delivery Method: Nasal cannula

## 2016-11-08 NOTE — Progress Notes (Signed)
Subjective: Interval History: has complaints wants to go home.  Objective: Vital signs in last 24 hours: Temp:  [97.4 F (36.3 C)-98.7 F (37.1 C)] 97.8 F (36.6 C) (04/02 0910) Pulse Rate:  [55-66] 61 (04/02 0910) Resp:  [14-22] 18 (04/02 0910) BP: (65-130)/(32-60) 103/45 (04/02 0910) SpO2:  [93 %-100 %] 98 % (04/02 0910) Weight:  [100.6 kg (221 lb 12.8 oz)] 100.6 kg (221 lb 12.8 oz) (04/02 0615) Weight change: -1.542 kg (-3 lb 6.4 oz)  Intake/Output from previous day: 04/01 0701 - 04/02 0700 In: 1037 [P.O.:702; Blood:335] Out: 5400 [Urine:5400] Intake/Output this shift: Total I/O In: 250 [I.V.:250] Out: 300 [Urine:300]  General appearance: alert, cooperative, moderately obese and pale Resp: diminished breath sounds bilaterally and rales bibasilar Cardio: S1, S2 normal and systolic murmur: systolic ejection 2/6, decrescendo at 2nd left intercostal space GI: obese, pos bs Extremities: edema 1+  Lab Results:  Recent Labs  11/07/16 0608 11/08/16 0609  WBC 5.4 4.7  HGB 7.5* 8.2*  HCT 22.7* 25.5*  PLT 161 168   BMET:  Recent Labs  11/07/16 1409 11/08/16 0609  NA 138 142  K 3.8 3.8  CL 103 102  CO2 29 32  GLUCOSE 122* 81  BUN 92* 79*  CREATININE 2.55* 1.94*  CALCIUM 8.7* 9.0   No results for input(s): PTH in the last 72 hours. Iron Studies: No results for input(s): IRON, TIBC, TRANSFERRIN, FERRITIN in the last 72 hours.  Studies/Results: No results found.  I have reviewed the patient's current medications.  Assessment/Plan: 1 AKI improving, not far from baseline, Diuresing 2 Anemia 3 CHF stable to better 4 CaD 5 Afib Reg today 5 enceph cleared P po Lasix, will not follow formally at this time and see again at your request    LOS: 5 days   Daveena Elmore L 11/08/2016,12:22 PM

## 2016-11-08 NOTE — Progress Notes (Signed)
Patient will wear and apply his home CPAP that his has brought and at the bedside.

## 2016-11-08 NOTE — Transfer of Care (Signed)
Immediate Anesthesia Transfer of Care Note  Patient: [name]  Procedure(s) Performed: Procedure(s): ESOPHAGOGASTRODUODENOSCOPY (EGD) (N/A)  Patient Location: Endoscopy Unit  Anesthesia Type:MAC  Level of Consciousness: awake and patient cooperative  Airway & Oxygen Therapy: Patient Spontanous Breathing and Patient connected to nasal cannula oxygen  Post-op Assessment: Report given to RN and Post -op Vital signs reviewed and stable  Post vital signs: Reviewed and stable  Last Vitals:  Vitals:   11/08/16 0837 11/08/16 0840  BP: (!) 65/33 (!) 76/34  Pulse: (!) 59   Resp: 15   Temp: 37.1 C     Last Pain:  Vitals:   11/08/16 0837  TempSrc: Oral  PainSc:       Patients Stated Pain Goal: 0 (25/85/27 7824)  Complications: No apparent anesthesia complications

## 2016-11-08 NOTE — Anesthesia Postprocedure Evaluation (Signed)
Anesthesia Post Note  Patient: [name]  Procedure(s) Performed: Procedure(s) (LRB): ESOPHAGOGASTRODUODENOSCOPY (EGD) (N/A)  Patient location during evaluation: PACU Anesthesia Type: MAC Level of consciousness: awake and alert Pain management: pain level controlled Vital Signs Assessment: post-procedure vital signs reviewed and stable Respiratory status: spontaneous breathing, nonlabored ventilation, respiratory function stable and patient connected to nasal cannula oxygen Cardiovascular status: stable and blood pressure returned to baseline Anesthetic complications: no       Last Vitals:  Vitals:   11/08/16 0910 11/08/16 1240  BP: (!) 103/45 (!) 111/59  Pulse: 61 66  Resp: 18 18  Temp: 36.6 C 36.8 C    Last Pain:  Vitals:   11/08/16 1240  TempSrc: Oral  PainSc:                  Jery Hollern DAVID

## 2016-11-08 NOTE — Op Note (Signed)
[facility] Patient Name: [name] Procedure Date : 11/08/2016 MRN: [id number] Attending MD: [name] , MD Date of Birth: [birth year] CSN: [id number] Age: 83 Admit Type: Inpatient Procedure:                Upper GI endoscopy Indications:              Suspected upper gastrointestinal bleeding Providers:                [name], MD, Burtis Junes, RN, Cherylynn Ridges,                            Technician, Lance Coon, CRNA Referring MD:              Medicines:                Propofol per Anesthesia Complications:            No immediate complications. Estimated Blood Loss:     Estimated blood loss: none. Procedure:                Pre-Anesthesia Assessment:                           - Prior to the procedure, a History and Physical                            was performed, and patient medications and                            allergies were reviewed. The patient's tolerance of                            previous anesthesia was also reviewed. The risks                            and benefits of the procedure and the sedation                            options and risks were discussed with the patient.                            All questions were answered, and informed consent                            was obtained. Prior Anticoagulants: The patient has                            taken Coumadin (warfarin). ASA Grade Assessment:                            III - A patient with severe systemic disease. After                            reviewing the risks and benefits, the patient was  deemed in satisfactory condition to undergo the                            procedure.                           After obtaining informed consent, the endoscope was                            passed under direct vision. Throughout the                            procedure, the patient's blood pressure, pulse, and                            oxygen saturations  were monitored continuously. The                            EG-[id number] ([id number]) scope was introduced through the                            mouth, and advanced to the second part of duodenum.                            The upper GI endoscopy was accomplished without                            difficulty. The patient tolerated the procedure                            well. Scope In: Scope Out: Findings:      The examined esophagus was normal.      A few diminutive erosions were found in the gastric antrum.      One cratered duodenal ulcer was found in the duodenal bulb. Impression:               - Normal esophagus.                           - Erosive gastropathy.                           - One duodenal ulcer.                           - No specimens collected. Moderate Sedation:      . Recommendation:           - Resume regular diet.                           - Continue present medications.                           - Use Protonix (pantoprazole) 40 mg PO daily.                           -  I think Coumadin could be resumed in a few days,                            provided that he is on a PPI for a few days before                            to allow some healing of the duodenal ulcer. I have                            begun Protonix 40mg  daily. We will sign off, call                            if needed. Procedure Code(s):        --- Professional ---                           [phone], Esophagogastroduodenoscopy, flexible,                            transoral; diagnostic, including collection of                            specimen(s) by brushing or washing, when performed                            (separate procedure) Diagnosis Code(s):        --- Professional ---                           K31.89, Other diseases of stomach and duodenum                           K26.9, Duodenal ulcer, unspecified as acute or                            chronic, without hemorrhage or perforation CPT  copyright 2016 American Medical Association. All rights reserved. The codes documented in this report are preliminary and upon coder review may  be revised to meet current compliance requirements. [name], MD 11/08/2016 8:44:01 AM This report has been signed electronically. Number of Addenda: 0

## 2016-11-08 NOTE — Progress Notes (Signed)
Progress Note  Patient Name: [name] Date of Encounter: 11/08/2016  Primary Cardiologist: Kelly/Croitoru  Subjective   No chest pain or dyspnea  Inpatient Medications    Scheduled Meds: . allopurinol  200 mg Oral Daily  . Chlorhexidine Gluconate Cloth  6 each Topical Q0600  . doxycycline  100 mg Oral Q12H  . dutasteride  0.5 mg Oral Daily  . ferumoxytol  510 mg Intravenous Weekly  . furosemide  80 mg Intravenous BID  . insulin aspart  0-9 Units Subcutaneous TID WC  . mouth rinse  15 mL Mouth Rinse BID  . mupirocin ointment  1 application Nasal BID  . rOPINIRole  3 mg Oral QHS  . sodium chloride flush  3 mL Intravenous Q12H  . tamsulosin  0.4 mg Oral QPC breakfast   Continuous Infusions:  PRN Meds: albuterol, bisacodyl, guaiFENesin, hydrOXYzine, mirtazapine, traMADol   Vital Signs    Vitals:   11/08/16 0843 11/08/16 0845 11/08/16 0850 11/08/16 0910  BP: (!) 97/40 (!) 110/44 (!) 105/44 (!) 103/45  Pulse: 65 66 63 61  Resp: 19 20 17 18   Temp:    97.8 F (36.6 C)  TempSrc:    Oral  SpO2: 95% 94% 96% 98%  Weight:      Height:        Intake/Output Summary (Last 24 hours) at 11/08/16 0914 Last data filed at 11/08/16 7654  Gross per 24 hour  Intake             1287 ml  Output             4950 ml  Net            -3663 ml   Filed Weights   11/07/16 0448 11/07/16 0900 11/08/16 0615  Weight: 247 lb 1.6 oz (112.1 kg) 243 lb 11.2 oz (110.5 kg) 221 lb 12.8 oz (100.6 kg)    Telemetry    Atrial fibrillation, rate controlled; ventricular pacing - Personally Reviewed  Physical Exam   GEN: Obese; No acute distress.   Neck:  supple Cardiac: irregular Respiratory: CTA GI: Soft, nontender, non-distended  MS: No edema Neuro:  Nonfocal  Psych: Normal affect   Labs    Chemistry Recent Labs Lab 11/06/16 0209  11/07/16 0608 11/07/16 1409 11/08/16 0609  NA 135  < > 136 138 142  K 5.5*  < > 3.9 3.8 3.8  CL 103  < > 101 103 102  CO2 23  < > 22 29 32    GLUCOSE 103*  < > 87 122* 81  BUN 104*  < > 96* 92* 79*  CREATININE 4.01*  < > 2.83* 2.55* 1.94*  CALCIUM 8.8*  < > 9.0 8.7* 9.0  PROT 6.5  --   --  6.6 6.8  ALBUMIN 3.1*  --  3.2* 3.0* 3.0*  AST 433*  --   --  238* 197*  ALT 222*  --   --  175* 158*  ALKPHOS 51  --   --  50 49  BILITOT 1.7*  --   --  1.9* 2.3*  GFRNONAA 13*  < > 19* 22* 30*  GFRAA 15*  < > 22* 25* 35*  ANIONGAP 9  < > 13 6 8   < > = values in this interval not displayed.   Hematology  Recent Labs Lab 11/06/16 0209 11/07/16 0608 11/08/16 0609  WBC 7.5 5.4 4.7  RBC 2.37* 2.28* 2.56*  HGB 7.5* 7.5* 8.2*  HCT  24.0* 22.7* 25.5*  MCV 101.3* 99.6 99.6  MCH 31.6 32.9 32.0  MCHC 31.3 33.0 32.2  RDW 17.0* 17.3* 18.6*  PLT 182 161 168    Cardiac Enzymes  Recent Labs Lab 11/03/16 2239 11/04/16 0313 11/04/16 0953  TROPONINI 0.28* 0.27* 0.20*     Recent Labs Lab 11/03/16 1358 11/03/16 1732  TROPIPOC 0.11* 0.13*     BNP  Recent Labs Lab 11/04/16 1532  BNP 1,048.9*     Cardiac Studies   - Left ventricle: The cavity size was moderately dilated. There was   moderate concentric hypertrophy. Systolic function was normal.   The estimated ejection fraction was in the range of 50% to 55%.   Wall motion was normal; there were no regional wall motion   abnormalities. Doppler parameters are consistent with restrictive   physiology, indicative of decreased left ventricular diastolic   compliance and/or increased left atrial pressure. Doppler   parameters are consistent with elevated ventricular end-diastolic   filling pressure. - Ventricular septum: Septal motion showed paradox. - Aortic valve: Trileaflet; moderately thickened, moderately   calcified leaflets. Valve mobility was restricted. Transvalvular   velocity was within the normal range. There was no stenosis.   There was mild regurgitation. - Mitral valve: Calcified annulus. Mildly thickened leaflets .   There was mild regurgitation. - Left  atrium: The atrium was severely dilated. - Right ventricle: The cavity size was moderately dilated. Wall   thickness was normal. Systolic function was mildly reduced. - Right atrium: The atrium was severely dilated. Pacer wire or   catheter noted in right atrium. - Tricuspid valve: There was moderate-severe regurgitation. - Pulmonic valve: There was mild regurgitation. - Pulmonary arteries: Systolic pressure was moderately increased.   PA peak pressure: 43 mm Hg (S). - Inferior vena cava: The vessel was dilated. The respirophasic   diameter changes were blunted (< 50%), consistent with elevated   central venous pressure.  Patient Profile     83 y.o. male with chronic diastolic heart failure, OSA on CPAP, chronic atrial fibrillation, CAD presents with volume overload, coagulopathy, acute renal insufficiency, hyperkalemia with uncertain cause (recent Bactrim Rx). Developed delirium and generalized pruritus without rash after admission.  Assessment & Plan    1. CHF:  No change in LV function by echo, same signs of RV dysfunction due to cor pulmonale.  Right heart failure is probably the dominant abnormality. Continue CPAP. Continue present dose of diuretics; can likely change to oral lasix in AM. 2. AFib: permanent, slow response with high prevalence of V pacing even without rate control meds, unchanged.  3. PPM: mostly V paced. 4. Coagulopathy: presumably explained by passive liver congestion.  5. Acute renal insuff:  Appears he had ATN, possibly due to hypotension, possibly also Trimethoprim related. No obstruction on [place]. Renal function continues to improve. Continue present dose of lasix today and transition to oral lasix in AM. 6. CAD: no angina. Troponin elevation is mild and unchanging, not consistent with ACS. 7. Anemia: duodenal ulcer noted on EGD; continue protonix; resume coumadin in a few days per GI.  Signed, Kirk Ruths, MD  11/08/2016, 9:14 AM

## 2016-11-08 NOTE — Anesthesia Preprocedure Evaluation (Signed)
Anesthesia Evaluation  Patient identified by MRN, date of birth, ID band  Airway Mallampati: I  TM Distance: >3 FB Neck ROM: Full    Dental   Pulmonary sleep apnea ,    Pulmonary exam normal        Cardiovascular + CAD and + Past MI  Normal cardiovascular exam+ pacemaker      Neuro/Psych Depression    GI/Hepatic   Endo/Other    Renal/GU      Musculoskeletal   Abdominal   Peds  Hematology   Anesthesia Other Findings   Reproductive/Obstetrics                             Anesthesia Physical Anesthesia Plan  ASA: III  Anesthesia Plan: MAC   Post-op Pain Management:    Induction: Intravenous  Airway Management Planned: Simple Face Mask  Additional Equipment:   Intra-op Plan:   Post-operative Plan:   Informed Consent: I have reviewed the patients History and Physical, chart, labs and discussed the procedure including the risks, benefits and alternatives for the proposed anesthesia with the patient or authorized representative who has indicated his/her understanding and acceptance.     Plan Discussed with: CRNA and Surgeon  Anesthesia Plan Comments:         Anesthesia Quick Evaluation

## 2016-11-08 NOTE — Progress Notes (Signed)
Patient ID: [name], male   DOB: 1932/05/01, 83 y.o.   MRN: [id number]    PROGRESS NOTE  GUNNAR HEREFORD  JQZ:[id number] DOB: 1931/09/02 DOA: 11/03/2016  PCP: [name], MD   Brief Narrative:  a 83 y.o. male with a-fib on coumadin, MI, non ischemic cardiomyopathy, s/p pacemaker, HTN, BPH (chronic urinary retention and taking Dutasteride and Tamsulosin), presented earlier to PCP office for dyspnea and was referred to ED due to multiple blood work abnormalities. Pt reports ongoing dyspnea for several days, poor oral intake and fatigue. No active chest pain, no urinary concerns. Pt explains he has had some "heaviness in the stomach" and felt full and was not able to eat. He does have varicose veins RLE and has ACE wrap but says earlier in the day he had lots of bleeding from the varicose veins, 6 episodes of significant blood loss, unable to quantify. Pt reports having recent scalp abscess that was lanced and tested + for staph, he has been on antibiotic for it.   ED Course: In ED, pt noted to be hemodynamically stable, VS notable for HR in 50-60's, initial oxygen saturation 88% on RA and pt placed on oxygen via [place], O2 sat up to 100%. Blood work notable for Hg 7.6, K 6 --> 6.3, Cr 3.35 --> 3.36. INR 5. Pt give dose of calcium gluconate and TRH asked to admit to step down unit due to severe electrolyte derangements.   Assessment & Plan:  Principal Problem:   ARF (acute renal failure) ([place]) imposed on CKD stage III - imposed on CKD stage III, GFR in 50's with stable Cr in January 2016, last report in 09/2016 Cr was 1.5  - likely multifactorial in etiology, pre renal --> ATN in the setting of bleeding, lasix use, losartan use, poor oral intake  - ARB's still on hold, Lasix done increased from 40 mg --> 80 IV BID started 3/29 - Cr is now trending down from 3.39 --> 3.53 --> 4.01 --> 3.5 --> 2.83 --> 1.94  - this is the weight trend in the past 48 hours  Filed Weights   11/07/16 0448  11/07/16 0900 11/08/16 0615  Weight: 112.1 kg (247 lb 1.6 oz) 110.5 kg (243 lb 11.2 oz) 100.6 kg (221 lb 12.8 oz)  - renal US with no evidence of hydronephrosis  - appreciate nephrology team assistance   Active Problems:   Hyperkalemia, hyperphosphatemia  - gave two doses of Ca gluconate in ED - also has gotten kayexalate x 2 on 3/29, also given one dose this AM 3/30, will give one more dose 15 gm 3/31 - K trend: 5.7 --> 5.8 --> 6.4 --> 6 --> 5.5 --> 3.5 --> 3.8 this AM  - repeat BMP in AM    Acute blood loss anemia - in pt on coumadin, had some bleeding from varicose veins, RLE but not sure if that is the only source  - coumadin was held since admission, INR trending down: 5 --> 5.41 --> 4.26 --> 2.81 --> 2.3 --> 1.78 - FOBT +, anemia panel with low iron  - EGD done this AM 4/2 -->   Erosive gastropathy.  One duodenal ulcer.  Resume regular diet.  Continue present medications.  Use Protonix (pantoprazole) 40 mg PO daily.  Coumadin could be resumed in a few days provided that pt is on a PPI for a few days before to allow some healing of the duodenal ulcer.    Dyspnea due to Acute on  Chronic diastolic heart failure ([place]) - last ECHO in 2016 with stable EF - paced rhythm - monitor daily weights, strict I/O - ECHO on this admission with EF 50 - 55% - pt started on Lasix 40 mg IV BID, increased the dose to 80 mg IV BID on 3/30 - plan to change to PO Lasix in AM per cardiology team  - weight trend since admission  [facility] [employer]ights   11/07/16 0448 11/07/16 0900 11/08/16 0615  Weight: 112.1 kg (247 lb 1.6 oz) 110.5 kg (243 lb 11.2 oz) 100.6 kg (221 lb 12.8 oz)     Transaminitis  - ? Congestive etiology  - monitor for now  - RUQ [place] with gallbladder wall thickening can be explained by congestive etiology  - slightly better this AM     RLE wound, varicose vein, venous stasis ulcer  - wound care consulted for assistance  - overall stable, healing     Atrial fibrillation,  permanent, slow VR, on chronic anticoagulation    Pacemaker single chamber, [employer], 2013   Elevated troponin, demand ischemia in the setting of ARF  - INR supra therapeutic on admission and coumadin has been held - INR overall trending down but will resume in few days per GI doctor recommendation due to duodenal ulcer noted on EGD    BPH (benign prostatic hyperplasia) - resumed home medical regimen Tamsulosin and Dutasteride  - I/O monitored     Recent staph infection, scalp abscess - continue home keflex     Depression - per psych, started Lexapro 5 mg daily to help the depression - can consider giving Seroquel 25 mg every 6 when necessary for agitation and hallucination    Obesity  - Body mass index is 38.94 kg/m.    OSA - on CPAP at night time   DVT prophylaxis: Coumadin on hold, SCD's requested  Code Status: Full  Family Communication: Patient at bedside, family updated over the phone  Disposition Plan: to be determined, pt not clinically stable for discharge at this time   Consultants:   Cardiology  Nephrology  [place]  GI  Psych   Procedures:   EGD 4/2 - duodenal ulcer, please see detail above   Antimicrobials:   Keflex continued from home for recent scalp abscess that was positive for staph infection   Subjective: Pt reports feeling better this AM.  Objective: Vitals:   11/08/16 0843 11/08/16 0845 11/08/16 0850 11/08/16 0910  BP: (!) 97/40 (!) 110/44 (!) 105/44 (!) 103/45  Pulse: 65 66 63 61  Resp: 19 20 17 18   Temp:    97.8 F (36.6 C)  TempSrc:    Oral  SpO2: 95% 94% 96% 98%  Weight:      Height:        Intake/Output Summary (Last 24 hours) at 11/08/16 1045 Last data filed at 11/08/16 0833  Gross per 24 hour  Intake             1287 ml  Output             4950 ml  Net            -3663 ml   Filed Weights   11/07/16 0448 11/07/16 0900 11/08/16 0615  Weight: 112.1 kg (247 lb 1.6 oz) 110.5 kg (243 lb 11.2 oz) 100.6 kg  (221 lb 12.8 oz)   Examination:  General exam: Appears more alert this AM, NAD Respiratory system: respiratory effort is stable crackles noted at bases, non  productive cough also noted Cardiovascular system: paced rhythm, no rubs, gallops or clicks. +1 L LE edema, RLE in ace wrap Gastrointestinal system: Abdomen is nondistended, soft and nontender. Normal bowel sounds heard. Central nervous system: alert and oriented x 3 Extremities: Symmetric 5 x 5 power.  Skin: RLE in ACE wrap  Data Reviewed: I have personally reviewed following labs and imaging studies  CBC:  Recent Labs Lab 11/04/16 0313 11/05/16 0236 11/06/16 0209 11/07/16 0608 11/08/16 0609  WBC 4.9 6.6 7.5 5.4 4.7  HGB 7.6* 7.6* 7.5* 7.5* 8.2*  HCT 24.5* 24.0* 24.0* 22.7* 25.5*  MCV 102.9* 102.1* 101.3* 99.6 99.6  PLT 173 181 182 161 697   Basic Metabolic Panel:  Recent Labs Lab 11/03/16 2239  11/06/16 0209 11/06/16 1246 11/07/16 0608 11/07/16 1409 11/08/16 0609  NA 136  < > 135 135 136 138 142  K 5.8*  < > 5.5* 5.6* 3.9 3.8 3.8  CL 102  < > 103 103 101 103 102  CO2 23  < > 23 21* 22 29 32  GLUCOSE 109*  < > 103* 119* 87 122* 81  BUN 85*  < > 104* 107* 96* 92* 79*  CREATININE 3.22*  < > 4.01* 3.50* 2.83* 2.55* 1.94*  CALCIUM 9.2  < > 8.8* 8.6* 9.0 8.7* 9.0  MG 2.3  --   --   --   --   --   --   PHOS 6.1*  --   --   --  4.1  --   --   < > = values in this interval not displayed. Liver Function Tests:  Recent Labs Lab 11/04/16 0313 11/05/16 0236 11/06/16 0209 11/07/16 0608 11/07/16 1409 11/08/16 0609  AST 198* 176* 433*  --  238* 197*  ALT 101* 109* 222*  --  175* 158*  ALKPHOS 50 52 51  --  50 49  BILITOT 1.0 1.2 1.7*  --  1.9* 2.3*  PROT 6.6 6.7 6.5  --  6.6 6.8  ALBUMIN 3.0* 3.0* 3.1* 3.2* 3.0* 3.0*    Recent Labs Lab 11/03/16 1640  LIPASE 16   Coagulation Profile:  Recent Labs Lab 11/04/16 0953 11/05/16 0236 11/06/16 0209 11/07/16 0608 11/08/16 0609  INR 5.41* 4.26* 2.81 2.20  1.78   Cardiac Enzymes:  Recent Labs Lab 11/03/16 2239 11/04/16 0313 11/04/16 0953  TROPONINI 0.28* 0.27* 0.20*   Thyroid Function Tests:  Recent Results (from the past 240 hour(s))  MRSA PCR Screening     Status: Abnormal   Collection Time: 11/04/16 12:39 AM  Result Value Ref Range Status   MRSA by PCR POSITIVE (A) NEGATIVE Final    Radiology Studies: No results found. Scheduled Meds: . allopurinol  200 mg Oral Daily  . Chlorhexidine Gluconate Cloth  6 each Topical Q0600  . doxycycline  100 mg Oral Q12H  . dutasteride  0.5 mg Oral Daily  . ferumoxytol  510 mg Intravenous Weekly  . furosemide  80 mg Intravenous BID  . insulin aspart  0-9 Units Subcutaneous TID WC  . mouth rinse  15 mL Mouth Rinse BID  . mupirocin ointment  1 application Nasal BID  . pantoprazole  40 mg Oral Daily  . rOPINIRole  3 mg Oral QHS  . sodium chloride flush  3 mL Intravenous Q12H  . tamsulosin  0.4 mg Oral QPC breakfast   Continuous Infusions:   LOS: 5 days   Time spent: 20 minutes   MAGICK-Kassadie Pancake, Rebecca Eaton, MD [facility] Pager  [phone]  If 7PM-7AM, please contact night-coverage [email or web address] Password [email or web address] 11/08/2016, 10:45 AM

## 2016-11-09 LAB — COMPREHENSIVE METABOLIC PANEL
ALBUMIN: 2.8 g/dL — AB (ref 3.5–5.0)
ALK PHOS: 46 U/L (ref 38–126)
ALT: 129 U/L — ABNORMAL HIGH (ref 17–63)
AST: 142 U/L — AB (ref 15–41)
Anion gap: 7 (ref 5–15)
BILIRUBIN TOTAL: 1.5 mg/dL — AB (ref 0.3–1.2)
BUN: 67 mg/dL — AB (ref 6–20)
CO2: 33 mmol/L — AB (ref 22–32)
Calcium: 8.7 mg/dL — ABNORMAL LOW (ref 8.9–10.3)
Chloride: 100 mmol/L — ABNORMAL LOW (ref 101–111)
Creatinine, Ser: 1.64 mg/dL — ABNORMAL HIGH (ref 0.61–1.24)
GFR calc Af Amer: 43 mL/min — ABNORMAL LOW (ref >60)
GFR calc non Af Amer: 37 mL/min — ABNORMAL LOW (ref >60)
GLUCOSE: 103 mg/dL — AB (ref 65–99)
POTASSIUM: 3.8 mmol/L (ref 3.5–5.1)
SODIUM: 140 mmol/L (ref 135–145)
TOTAL PROTEIN: 6.2 g/dL — AB (ref 6.5–8.1)

## 2016-11-09 LAB — PROTIME-INR
INR: 1.58
PROTHROMBIN TIME: 19 s — AB (ref 11.4–15.2)

## 2016-11-09 LAB — CBC
HEMATOCRIT: 24.4 % — AB (ref 39.0–52.0)
Hemoglobin: 7.7 g/dL — ABNORMAL LOW (ref 13.0–17.0)
MCH: 31.8 pg (ref 26.0–34.0)
MCHC: 31.6 g/dL (ref 30.0–36.0)
MCV: 100.8 fL — ABNORMAL HIGH (ref 78.0–100.0)
Platelets: 166 10*3/uL (ref 150–400)
RBC: 2.42 MIL/uL — ABNORMAL LOW (ref 4.22–5.81)
RDW: 18.6 % — AB (ref 11.5–15.5)
WBC: 4.6 10*3/uL (ref 4.0–10.5)

## 2016-11-09 LAB — GLUCOSE, CAPILLARY
GLUCOSE-CAPILLARY: 86 mg/dL (ref 65–99)
GLUCOSE-CAPILLARY: 99 mg/dL (ref 65–99)
GLUCOSE-CAPILLARY: 131 mg/dL — AB (ref 65–99)
Glucose-Capillary: 144 mg/dL — ABNORMAL HIGH (ref 65–99)

## 2016-11-09 LAB — PREPARE RBC (CROSSMATCH)

## 2016-11-09 MED ORDER — FUROSEMIDE 40 MG PO TABS: 40.0000 mg | ORAL_TABLET | Freq: Every day | ORAL | Status: DC

## 2016-11-09 MED ORDER — WARFARIN SODIUM 5 MG PO TABS: 5.0000 mg | ORAL_TABLET | Freq: Every day | ORAL | Status: DC

## 2016-11-09 MED ORDER — SODIUM CHLORIDE 0.9 % IV SOLN
Freq: Once | INTRAVENOUS | Status: AC
  Administered 2016-11-09: 16:00:00 via INTRAVENOUS

## 2016-11-09 MED ORDER — PANTOPRAZOLE SODIUM 40 MG PO TBEC: 40.0000 mg | DELAYED_RELEASE_TABLET | Freq: Every day | ORAL | 1 refills | Status: DC

## 2016-11-09 MED ORDER — DOXYCYCLINE HYCLATE 100 MG PO TABS: 100.0000 mg | ORAL_TABLET | Freq: Two times a day (BID) | ORAL | 0 refills | Status: DC

## 2016-11-09 MED ORDER — HYDROXYZINE HCL 25 MG PO TABS: 25.0000 mg | ORAL_TABLET | Freq: Four times a day (QID) | ORAL | 0 refills | Status: DC | PRN

## 2016-11-09 MED ORDER — FUROSEMIDE 40 MG PO TABS
40.0000 mg | ORAL_TABLET | Freq: Every day | ORAL | Status: DC
  Administered 2016-11-10: 40 mg via ORAL
  Filled 2016-11-09 (×2): qty 1

## 2016-11-09 MED ORDER — ALLOPURINOL 100 MG PO TABS: 200.0000 mg | ORAL_TABLET | Freq: Every day | ORAL | 1 refills | Status: DC

## 2016-11-09 MED ORDER — ESCITALOPRAM OXALATE 5 MG PO TABS: 5.0000 mg | ORAL_TABLET | Freq: Every day | ORAL | 0 refills | Status: DC

## 2016-11-09 MED ORDER — FUROSEMIDE 40 MG PO TABS: 40.0000 mg | ORAL_TABLET | Freq: Two times a day (BID) | ORAL | Status: DC

## 2016-11-09 MED ORDER — ESCITALOPRAM OXALATE 10 MG PO TABS
5.0000 mg | ORAL_TABLET | Freq: Every day | ORAL | Status: DC
  Administered 2016-11-09: 5 mg via ORAL
  Filled 2016-11-09: qty 1

## 2016-11-09 NOTE — Progress Notes (Signed)
OT Cancellation Note  Patient Details Name: [name] MRN: [id number] DOB: 1932-02-15   Cancelled Treatment:    Reason Eval/Treat Not Completed: Fatigue/lethargy limiting ability to participate. Will return.  Malka So 11/09/2016, 3:01 PM  [phone]

## 2016-11-09 NOTE — Progress Notes (Signed)
Educated patient to continue taking daily weights in the mornings after urinating to get the best results on his weight status.

## 2016-11-09 NOTE — Discharge Summary (Signed)
Physician Discharge Summary  [name]:[id number] DOB: [birth year] DOA: 11/03/2016  PCP: [name], MD  Admit date: 11/03/2016 Discharge date: 11/10/2016  Recommendations for Outpatient Follow-up:  1. Pt will need to follow up with PCP in 1 weeks post discharge 2. Please obtain BMP to evaluate electrolytes and kidney function, K level  3. Please also check CBC to evaluate Hg and Hct levels 4. Stop avapro due to AKI and hyperkalemia  5. Stop fenofibrate until renal function stabilizes 6. Also please note potassium was stopped until pt has it rechecked and can be restarted as indicated  7. Ok to start Coumadin on April 6th, 2018, must have PT/INR checked in Coumadin clinic on April 10. TOC appt one week after DC and then FU with Dr [name] 8. Scalp abscess with MRSA+, Keflex changed to doxycycline and pt to complete for 5 more days post discharge   Discharge Diagnoses:  Principal Problem:   Acute delirium Active Problems:   ARF (acute renal failure) (HCC)   Atrial fibrillation, persistent, slow VR   Pacemaker single chamber, [employer], 2013   Chronic anticoagulation   BPH (benign prostatic hyperplasia)   Chronic diastolic heart failure (HCC)   Depression   Acute hyperkalemia   Secondary hypercoagulable state ([place])   Transaminitis   PAH (pulmonary artery hypertension)   Acute confusion  Discharge Condition: Stable  Diet recommendation: Heart healthy diet discussed in details   Brief Narrative:  a 83 y.o.malewith a-fib on coumadin, MI, non ischemic cardiomyopathy, s/p pacemaker, HTN, BPH (chronic urinary retention and taking Dutasteride and Tamsulosin), presented earlier to PCP office for dyspnea and was referred to ED due to multiple blood work abnormalities. Pt reports ongoing dyspnea for several days, poor oral intake and fatigue. No active chest pain, no urinary concerns. Pt explains he has had some "heaviness in the stomach" and felt full  and was not able to eat. He does have varicose veins RLE and has ACE wrap but says earlier in the day he had lots of bleeding from the varicose veins, 6 episodes of significant blood loss, unable to quantify. Pt reports having recent scalp abscess that was lanced and tested + for staph, he has been on antibiotic for it.   ED Course:In ED, pt noted to be hemodynamically stable, VS notable for HR in 50-60's, initial oxygen saturation 88% on RA and pt placed on oxygen via [place], O2 sat up to 100%. Blood work notable for Hg 7.6, K 6 -->6.3, Cr 3.35 -->3.36. INR 5. Pt give dose of calcium gluconate and TRH asked to admit to step down unit due to severe electrolyte derangements.   Assessment & Plan:  Principal Problem: ARF (acute renal failure) ([name]) imposed on CKD stage III - imposed on CKD stage III, GFR in 50's with stable Cr in January 2016, last report in 09/2016 Cr was 1.5  - likely multifactorial in etiology, pre renal --> ATN in the setting of bleeding, lasix use, losartan use, poor oral intake  - ARB's still on hold, Lasix done increased from 40 mg --> 80 IV BID started 3/29 - Cr is now trending down from 3.39 --> 3.53 --> 4.01 --> 3.5 --> 2.83 --> 1.94  - this is the weight trend in the past 48 hours       Filed Weights   11/07/16 0448 11/07/16 0900 11/08/16 0615  Weight: 112.1 kg (247 lb 1.6 oz) 110.5 kg (243 lb 11.2 oz) 100.6 kg (221 lb 12.8  oz)  - renal US with no evidence of hydronephrosis  - appreciate nephrology team assistance   Active Problems: Hyperkalemia, hyperphosphatemia  - gave two doses of Ca gluconate in ED - also has gotten kayexalate x 2 on 3/29, given one dose AM 3/30, gave one more dose 15 gm 3/31 - K trend: 5.7 --> 5.8 --> 6.4 --> 6 --> 5.5 --> 3.5 --> 3.8 this AM  - repeat BMP in AM  Acute blood loss anemia - in pt on coumadin, had some bleeding from varicose veins, RLE but not sure if that is the only source  - coumadin was held since admission, INR  trending down: 5 --> 5.41 --> 4.26 --> 2.81 --> 2.3 --> 1.78 --> 1.58 - FOBT +, anemia panel with low iron  - EGD done AM 4/2 -->   Erosive gastropathy.  One duodenal ulcer.  Resume regular diet.  Continue present medications.  Use Protonix (pantoprazole) 40 mg PO daily.  Coumadin could be resumed 4/6, provided that pt is on a PPI for a few days before to allow some healing of the duodenal ulcer. - Hg is down this AM 8.2 --> 7.7, will transfuse one U PRBC today and if Hg stable, possible d/c in AM  Dyspnea due to Acute on Chronic diastolic heart failure ([place]) - last ECHO in 2016 with stable EF - paced rhythm - monitor daily weights, strict I/O - ECHO on this admission with EF 50 - 55% - pt started on Lasix 40 mg IV BID, increased the dose to 80 mg IV BID on 3/30 - pt now on lasix 40 mg PO QD per cardiology, renal team also in agreement, can use additional dose if needed for weight gain of more than 2-3 lbs - weight trend since admission       Filed Weights   11/07/16 0448 11/07/16 0900 11/08/16 0615  Weight: 112.1 kg (247 lb 1.6 oz) 110.5 kg (243 lb 11.2 oz) 100.6 kg (221 lb 12.8 oz)     Transaminitis  - ? Congestive etiology  - RUQ [place] with gallbladder wall thickening can be explained by congestive etiology  - overall trending down     RLE wound, varicose vein, venous stasis ulcer  - wound care consulted for assistance  - overall stable, healing   Atrial fibrillation, permanent, slow VR, on chronic anticoagulation  Pacemaker single chamber, [employer], 2013   Elevated troponin, demand ischemia in the setting of ARF  - INR supra therapeutic on admission and coumadin has been held - INR overall trending down but will resume in few days per GI doctor recommendation due to duodenal ulcer noted on EGD - plan to resume Coumadin on 4/6  BPH (benign prostatic hyperplasia) - resumed home medical regimen Tamsulosin and Dutasteride     Recent staph  infection, scalp abscess - continue doxycycline to complete therapy   Depression - per psych, started Lexapro 5 mg daily to help the depression - can consider giving Seroquel 25 mg every 6 when necessary for agitation and hallucination, in the future but for now daughter wants to hold off on more medications     Obesity  - Body mass index is 38.94 kg/m.    OSA - on CPAP at night time   DVT prophylaxis: Coumadin on hold, SCD's requested  Code Status: Full  Family Communication: Patient at bedside, family updated over the phone  Disposition Plan: Home in AM if Hg stable   Consultants:   Cardiology  Nephrology  WOC  GI  Psych   Procedures:   EGD 4/2 - duodenal ulcer, please see detail above   Antimicrobials:   Scalp abscess on previous admission drained, + MRSA pt to continue doxycycline on discharge   Procedures/Studies: Ct Head Wo Contrast  Result Date: 11/06/2016 CLINICAL DATA:  Confusion. EXAM: CT HEAD WITHOUT CONTRAST TECHNIQUE: Contiguous axial images were obtained from the base of the skull through the vertex without intravenous contrast. COMPARISON:  August 20, 2014 FINDINGS: Brain: No subdural, epidural, or subarachnoid hemorrhage. Low-attenuation in the left cerebellar hemisphere on series 3, image 9 is identified. There is streak artifact in this region. There is no associated mass effect on the fourth ventricle. The cerebellum, brainstem, and basal cisterns are otherwise normal. Ventricles and sulci are mildly prominent but stable. White matter changes are moderate and similar in the interval. A lacunar infarct in the left basal ganglia is unchanged. Low-attenuation through the left temporal lobe is thought to be due to streak artifact. No other sites of ischemia or infarct are identified. No mass, mass effect, or midline shift. Vascular: Calcified atherosclerosis is seen in the intracranial carotid arteries. Skull: Normal. Negative for fracture or focal  lesion. Sinuses/Orbits: No acute finding. Other: None. IMPRESSION: 1. There is low-attenuation in the left cerebellar hemisphere. The finding could be at least partially artifactual, due to streak artifact and beam hardening. However, an age-indeterminate infarct is not excluded on this study. Recommend clinical correlation. An MRI could further assess if clinically warranted. 2. No bleed identified.  No other acute abnormalities. These results will be called to the ordering clinician or representative by the Radiologist Assistant, and communication documented in the PACS or zVision Dashboard. Electronically Signed   By: Dorise Bullion III M.D   On: 11/06/2016 09:01   Ct Chest Wo Contrast  Result Date: 11/04/2016 CLINICAL DATA:  Dyspnea and fatigue for several days.  Hypertension EXAM: CT CHEST WITHOUT CONTRAST TECHNIQUE: Multidetector CT imaging of the chest was performed following the standard protocol without IV contrast. COMPARISON:  Chest radiograph November 03, 2016 FINDINGS: Cardiovascular: There it is no appreciable thoracic aortic aneurysm. There is moderate calcification at the origins of the great vessels. There is atherosclerotic calcification in the aorta. There are multiple foci of coronary artery calcification. Pacemaker lead is attached to the right ventricle. There is cardiomegaly. Pericardium is not appreciably thickened. Main pulmonary outflow tract measures 3.8 cm in diameter. There is calcification in the left ventricular wall anteriorly. Mediastinum/Nodes: There is a focal area of benign-appearing calcification in the left thyroid. Thyroid otherwise appears unremarkable. There is no appreciable thoracic adenopathy. Lungs/Pleura: There is a small pleural effusion on the right. There is bibasilar atelectatic change. There is also slight atelectasis in the posterior segments of each upper lobe. There are a few small scattered bullae in the upper lobes posteriorly. There is no edema or  consolidation. On axial slice 64 series 8, there is a 4 mm nodular opacity in the posterior segment of the right upper lobe. There is mild lower lobe bronchiectatic change bilaterally. Upper Abdomen: There is atherosclerotic calcification in the abdominal aorta. There are cysts arising from each kidney, incompletely visualized. The largest of these cysts is on the left, measuring approximately 5 x 5 cm. Cyst arising from the posterior upper pole of the right kidney measures 3.2 x 3.1 cm. Musculoskeletal: There is degenerative change throughout the thoracic spine. There are no blastic or lytic bone lesions. IMPRESSION: Small right-sided pleural effusion. Patchy bibasilar  atelectasis. No edema or consolidation. There is a 4 mm nodular opacity in the posterior segment of the right upper lobe. No follow-up needed if patient is low-risk. Non-contrast chest CT can be considered in 12 months if patient is high-risk. This recommendation follows the consensus statement: Guidelines for Management of Incidental Pulmonary Nodules Detected on CT Images: From the Fleischner Society 2017; Radiology 2017; 284:228-243. Cardiomegaly. Multiple foci of atherosclerotic calcification including areas of coronary artery calcification. Enlargement of the main pulmonary outflow tract suggests a degree of underlying pulmonary hypertension. Calcification in the left ventricular wall is likely indicative of prior myocardial infarct. No evident adenopathy. Electronically Signed   By: Lowella Grip III M.D.   On: 11/04/2016 10:30   US Renal  Result Date: 11/04/2016 CLINICAL DATA:  Acute renal failure EXAM: RENAL / URINARY TRACT ULTRASOUND COMPLETE COMPARISON:  Abdominal ultrasound 01/25/2006 FINDINGS: Right Kidney: Length: 13.8 cm. 4.3 cm right midpole cyst, 4 cm right upper pole cyst. Echogenicity within normal limits. No mass or hydronephrosis visualized. Left Kidney: Length: 13.8 cm. 7 cm left lower pole cyst. 3 cm and 2 cm central  cysts. Echogenicity within normal limits. No mass or hydronephrosis visualized. Bladder: Negative IMPRESSION: Bilateral renal cysts.  No renal obstruction.  Normal renal size. Electronically Signed   By: Franchot Gallo M.D.   On: 11/04/2016 16:39   Dg Chest Port 1 View  Result Date: 11/03/2016 CLINICAL DATA:  Shortness of Breath EXAM: PORTABLE CHEST 1 VIEW COMPARISON:  08/19/2014 FINDINGS: Cardiomegaly is noted. Central mild vascular congestion without convincing pulmonary edema. Elevation of the right hemidiaphragm again noted. Bilateral basilar atelectasis. Single lead cardiac pacemaker is unchanged in position. No segmental infiltrate. IMPRESSION: Cardiomegaly. Central mild vascular congestion without convincing pulmonary edema. Mild basilar atelectasis. No segmental infiltrate. Electronically Signed   By: Lahoma Crocker M.D.   On: 11/03/2016 16:14   US Abdomen Limited Ruq  Result Date: 11/04/2016 CLINICAL DATA:  Transaminitis EXAM: US ABDOMEN LIMITED - RIGHT UPPER QUADRANT COMPARISON:  Ultrasound 01/25/2006 FINDINGS: Gallbladder: Negative for gallstones. Gallbladder wall thickened 5.2 mm. Negative sonographic Murphy sign. Common bile duct: Diameter: 2.6 mm Liver: Increased echogenicity liver diffusely most compatible with fatty infiltration. No focal liver lesion. No ascites in the right upper quadrant IMPRESSION: Gallbladder wall thickening without gallstones or positive Murphy sign. Possible acute or chronic cholecystitis. Also possible causes of gallbladder wall thickening would include liver failure and heart failure. Echogenic liver compatible with fatty infiltration. Electronically Signed   By: Franchot Gallo M.D.   On: 11/04/2016 16:41     Discharge Exam: Vitals:   11/09/16 0900 11/09/16 1255  BP: (!) 118/48 (!) 122/59  Pulse: 66 66  Resp:  18  Temp:  97.7 F (36.5 C)   Vitals:   11/08/16 1937 11/09/16 0732 11/09/16 0900 11/09/16 1255  BP: (!) 102/45 (!) 133/51 (!) 118/48 (!) 122/59   Pulse: (!) 57 61 66 66  Resp: 16   18  Temp:    97.7 F (36.5 C)  TempSrc:    Oral  SpO2: 98% 94% 96% 100%  Weight:  105.9 kg (233 lb 8 oz)    Height:        General: Pt is alert, follows commands appropriately, not in acute distress Cardiovascular: Irregular rate and rhythm, S1/S2 +, no rubs, no gallops Respiratory: Clear to auscultation bilaterally, no wheezing, no crackles, no rhonchi Abdominal: Soft, non tender, non distended, bowel sounds +, no guarding Extremities:  no cyanosis, pulses palpable bilaterally DP and  PT  Discharge Instructions   Allergies as of 11/09/2016      Reactions   Vicodin [hydrocodone-acetaminophen] Nausea And Vomiting   Hydrocodone Nausea Only      Medication List    STOP taking these medications   cephALEXin 500 MG capsule Commonly known as:  KEFLEX   fenofibrate 160 MG tablet   irbesartan 150 MG tablet Commonly known as:  AVAPRO   potassium chloride SA 20 MEQ tablet Commonly known as:  K-DUR,KLOR-CON     TAKE these medications   acetaminophen 650 MG CR tablet Commonly known as:  TYLENOL Take 650 mg by mouth every 8 (eight) hours as needed for pain.   allopurinol 100 MG tablet Commonly known as:  ZYLOPRIM Take 2 tablets (200 mg total) by mouth daily. What changed:  medication strength  how much to take   COLCRYS 0.6 MG tablet Generic drug:  colchicine Take 0.6 mg by mouth as needed (flareups).   doxycycline 100 MG tablet Commonly known as:  VIBRA-TABS Take 1 tablet (100 mg total) by mouth every 12 (twelve) hours.   dutasteride 0.5 MG capsule Commonly known as:  AVODART Take 0.5 mg by mouth daily.   escitalopram 5 MG tablet Commonly known as:  LEXAPRO Take 1 tablet (5 mg total) by mouth at bedtime.   furosemide 40 MG tablet Commonly known as:  LASIX Take 1 tablet (40 mg total) by mouth daily. Can use additional tablet in a day if needed for swelling or weight gain of 2-3 lbs in 24 hours What changed:  medication  strength  additional instructions   hydrOXYzine 25 MG tablet Commonly known as:  ATARAX/VISTARIL Take 1 tablet (25 mg total) by mouth every 6 (six) hours as needed for itching.   loratadine 10 MG tablet Commonly known as:  CLARITIN Take 10 mg by mouth daily.   mirtazapine 15 MG tablet Commonly known as:  REMERON Take 15 mg by mouth at bedtime.   nitroGLYCERIN 0.4 MG SL tablet Commonly known as:  NITROSTAT Place 0.4 mg under the tongue every 5 (five) minutes as needed. Chest pain   OMEGA-3 PO Take 1 capsule by mouth 2 (two) times daily.   pantoprazole 40 MG tablet Commonly known as:  PROTONIX Take 1 tablet (40 mg total) by mouth daily. Start taking on:  11/10/2016   PRESERVISION AREDS 2 Caps Take 1 capsule by mouth daily.   rOPINIRole 3 MG tablet Commonly known as:  REQUIP Take 1 tablet by mouth daily. Take 1 tab daily   STOOL SOFTENER PO Take 2 tablets by mouth at bedtime.   tamsulosin 0.4 MG Caps capsule Commonly known as:  FLOMAX Take 0.4 mg by mouth daily after breakfast.   traMADol 50 MG tablet Commonly known as:  ULTRAM Take 50 mg by mouth 4 (four) times daily. For pain   warfarin 5 MG tablet Commonly known as:  COUMADIN Take 1 tablet (5 mg total) by mouth daily. Start taking on 11/12/2016, you must have PT/INR What changed:  additional instructions       Follow-up Information    Kerin Ransom, PA-C Follow up on 11/23/2016.   Specialties:  Cardiology, Radiology Why:  Please arrive at 10:45 am for an 11:00 appointment. Contact information: [address] [place] [address] [phone]        [name], MD Follow up.   Specialty:  Internal Medicine Contact information: [address] [place] [address] [phone]  The results of significant diagnostics from this hospitalization (including imaging, microbiology, ancillary and laboratory) are listed below for reference.     Microbiology: Recent  Results (from the past 240 hour(s))  MRSA PCR Screening     Status: Abnormal   Collection Time: 11/04/16 12:39 AM  Result Value Ref Range Status   MRSA by PCR POSITIVE (A) NEGATIVE Final    Comment:        The GeneXpert MRSA Assay (FDA approved for NASAL specimens only), is one component of a comprehensive MRSA colonization surveillance program. It is not intended to diagnose MRSA infection nor to guide or monitor treatment for MRSA infections. RESULT CALLED TO, READ BACK BY AND VERIFIED WITH: CAITLYN,RN  ON 4N @0241  11/04/16 MKELLY,MLT      Labs: Basic Metabolic Panel:  Recent Labs Lab 11/03/16 2239  11/06/16 1246 11/07/16 0608 11/07/16 1409 11/08/16 0609 11/09/16 0338  NA 136  < > 135 136 138 142 140  K 5.8*  < > 5.6* 3.9 3.8 3.8 3.8  CL 102  < > 103 101 103 102 100*  CO2 23  < > 21* 22 29 32 33*  GLUCOSE 109*  < > 119* 87 122* 81 103*  BUN 85*  < > 107* 96* 92* 79* 67*  CREATININE 3.22*  < > 3.50* 2.83* 2.55* 1.94* 1.64*  CALCIUM 9.2  < > 8.6* 9.0 8.7* 9.0 8.7*  MG 2.3  --   --   --   --   --   --   PHOS 6.1*  --   --  4.1  --   --   --   < > = values in this interval not displayed. Liver Function Tests:  Recent Labs Lab 11/05/16 0236 11/06/16 0209 11/07/16 0608 11/07/16 1409 11/08/16 0609 11/09/16 0338  AST 176* 433*  --  238* 197* 142*  ALT 109* 222*  --  175* 158* 129*  ALKPHOS 52 51  --  50 49 46  BILITOT 1.2 1.7*  --  1.9* 2.3* 1.5*  PROT 6.7 6.5  --  6.6 6.8 6.2*  ALBUMIN 3.0* 3.1* 3.2* 3.0* 3.0* 2.8*    Recent Labs Lab 11/03/16 1640  LIPASE 16    Recent Labs Lab 11/06/16 0712 11/08/16 0609  AMMONIA 58* 67*   CBC:  Recent Labs Lab 11/05/16 0236 11/06/16 0209 11/07/16 0608 11/08/16 0609 11/09/16 0338  WBC 6.6 7.5 5.4 4.7 4.6  HGB 7.6* 7.5* 7.5* 8.2* 7.7*  HCT 24.0* 24.0* 22.7* 25.5* 24.4*  MCV 102.1* 101.3* 99.6 99.6 100.8*  PLT 181 182 161 168 166   Cardiac Enzymes:  Recent Labs Lab 11/03/16 2239 11/04/16 0313  11/04/16 0953  TROPONINI 0.28* 0.27* 0.20*   BNP: BNP (last 3 results)  Recent Labs  11/04/16 1532  BNP 1,048.9*   CBG:  Recent Labs Lab 11/08/16 1613 11/08/16 2149 11/09/16 0750 11/09/16 1142 11/09/16 1604  GLUCAP 118* 101* 86 99 144*   SIGNED: Time coordinating discharge:  30 minutes  MAGICK-Jolanta Cabeza, MD  [facility] 11/09/2016, 4:22 PM Pager [phone]  If 7PM-7AM, please contact night-coverage [email or web address] Password [email or web address]

## 2016-11-09 NOTE — Progress Notes (Signed)
Pt places self on/off cpap.  Rt will monitor. 

## 2016-11-09 NOTE — Progress Notes (Signed)
qPhysical Therapy Treatment Patient Details Name: [name] MRN: [id number] DOB: [birth year] Today's Date: 11/09/2016    History of Present Illness [name] has a PMH significant for HTN, CAD, chronic A fib on coumadin, OSA on CPAP, pulmonary HTN, nonischemic CMP (EF 55-60%) s/p pacemaker, obesity, gout, and BPH who presented to his PCP's office on 11/03/16 c/o dyspnea, increasing lower extremity edema, and generalized weakness.  He was sent to the [facility] and noted to have bradycardia, hypoxia, anemia (Hgb of 7.6), hyperkalemia, supratherapeutic coumadin with INR of 5.06, and AKI with Scr of 3.35.     PT Comments    Pt progressing with mobility.  He reports he had walked earlier today with nursing staff and RW. Advised pt he can transition to gait without device so long as he has staff for safety to assist while on unit. Pt appears to be highly motivated to resume his caregiving duties at home for his wife who uses a power w/c.  [name] lives next door and can assist prn. Will cont to progress mobility while pt on this venue of care.   Follow Up Recommendations  Home health PT     Equipment Recommendations  3in1 (PT);Other (comment)    Recommendations for Other Services       Precautions / Restrictions Precautions Precautions: Fall;Other (comment) Precaution Comments: Contact Restrictions Weight Bearing Restrictions: No    Mobility  Bed Mobility               General bed mobility comments: Sitting EOB upon arrival  Transfers Overall transfer level: Needs assistance Equipment used: Rolling walker (2 wheeled) Transfers: Sit to/from Stand Sit to Stand: Supervision         General transfer comment: toilet transfer with rail modified independent  Ambulation/Gait Ambulation/Gait assistance: Supervision;Min guard Ambulation Distance (Feet): 200 Feet Assistive device: Rolling walker (2 wheeled);None Gait Pattern/deviations: Step-through pattern;Trunk flexed      General Gait Details: began gait with RW, transitioned to no device at end of session supervision, shuffling gait pattern upon fatigue   Stairs            Wheelchair Mobility    Modified Rankin (Stroke Patients Only)       Balance Overall balance assessment: Modified Independent (good bil hand manipulation while washing hands at sink)                                          Cognition Arousal/Alertness: Awake/alert Behavior During Therapy: WFL for tasks assessed/performed Overall Cognitive Status: Within Functional Limits for tasks assessed                                        Exercises      General Comments General comments (skin integrity, edema, etc.): DUE 2/4 during mobility      Pertinent Vitals/Pain Pain Assessment: No/denies pain    Home Living                      Prior Function            PT Goals (current goals can now be found in the care plan section) Acute Rehab PT Goals Patient Stated Goal: get home to take care of my wife PT Goal Formulation: With patient Time For Goal Achievement:  11/21/16 Potential to Achieve Goals: Good Progress towards PT goals: Progressing toward goals    Frequency    Min 3X/week      PT Plan Current plan remains appropriate    Co-evaluation             End of Session Equipment Utilized During Treatment: Gait belt Activity Tolerance: Patient tolerated treatment well Patient left: in chair;with call bell/phone within reach;with nursing/sitter in room (sitting EOB, has telemonitor in room, nurse aid in room) Nurse Communication: Mobility status PT Visit Diagnosis: Unsteadiness on feet (R26.81)     Time: 8325-4982 PT Time Calculation (min) (ACUTE ONLY): 26 min  Charges:  $Gait Training: 8-22 mins $Therapeutic Activity: 8-22 mins                    G CodesSuszanne Finch, PT [phone]   [place] 11/09/2016, 2:09 PM

## 2016-11-09 NOTE — Progress Notes (Signed)
Progress Note  Patient Name: [name] Date of Encounter: 11/09/2016  Primary Cardiologist: Croitoru  Subjective   No chest pain or dyspnea. States was drinking water all day PTA. Felt he needed 8 glasses/day to minimize itching and help his kidneys. Admits to eating high-Na foods. Did ok w/ walk, has DOE  Inpatient Medications    Scheduled Meds: . allopurinol  200 mg Oral Daily  . doxycycline  100 mg Oral Q12H  . dutasteride  0.5 mg Oral Daily  . escitalopram  5 mg Oral QHS  . ferumoxytol  510 mg Intravenous Weekly  . furosemide  80 mg Oral BID  . insulin aspart  0-9 Units Subcutaneous TID WC  . mouth rinse  15 mL Mouth Rinse BID  . pantoprazole  40 mg Oral Daily  . rOPINIRole  3 mg Oral QHS  . sodium chloride flush  3 mL Intravenous Q12H  . tamsulosin  0.4 mg Oral QPC breakfast   Continuous Infusions:  PRN Meds: albuterol, bisacodyl, guaiFENesin, hydrOXYzine, mirtazapine, traMADol   Vital Signs    Vitals:   11/08/16 1542 11/08/16 1937 11/09/16 0732 11/09/16 0900  BP:  (!) 102/45 (!) 133/51 (!) 118/48  Pulse:  (!) 57 61 66  Resp:  16    Temp:      TempSrc:      SpO2: 99% 98% 94% 96%  Weight:   233 lb 8 oz (105.9 kg)   Height:        Intake/Output Summary (Last 24 hours) at 11/09/16 1048 Last data filed at 11/09/16 0900  Gross per 24 hour  Intake              600 ml  Output             3475 ml  Net            -2875 ml   Filed Weights   11/07/16 0900 11/08/16 0615 11/09/16 0732  Weight: 243 lb 11.2 oz (110.5 kg) 221 lb 12.8 oz (100.6 kg) 233 lb 8 oz (105.9 kg)    Telemetry    Atrial fibrillation, rate controlled; ventricular pacing Sometimes rate appears slow, due to compensatory pause after PVC, mostly during sleep. No prob w/ PPM - Personally Reviewed  Physical Exam   GEN: Obese; No acute distress.   Neck:  supple Cardiac: irregular, +SEM Respiratory: few rales bases GI: Soft, nontender, non-distended  MS: No edema Neuro:  Nonfocal    Psych: Normal affect   Labs    Chemistry  Recent Labs Lab 11/07/16 1409 11/08/16 0609 11/09/16 0338  NA 138 142 140  K 3.8 3.8 3.8  CL 103 102 100*  CO2 29 32 33*  GLUCOSE 122* 81 103*  BUN 92* 79* 67*  CREATININE 2.55* 1.94* 1.64*  CALCIUM 8.7* 9.0 8.7*  PROT 6.6 6.8 6.2*  ALBUMIN 3.0* 3.0* 2.8*  AST 238* 197* 142*  ALT 175* 158* 129*  ALKPHOS 50 49 46  BILITOT 1.9* 2.3* 1.5*  GFRNONAA 22* 30* 37*  GFRAA 25* 35* 43*  ANIONGAP 6 8 7      Hematology  Recent Labs Lab 11/07/16 0608 11/08/16 0609 11/09/16 0338  WBC 5.4 4.7 4.6  RBC 2.28* 2.56* 2.42*  HGB 7.5* 8.2* 7.7*  HCT 22.7* 25.5* 24.4*  MCV 99.6 99.6 100.8*  MCH 32.9 32.0 31.8  MCHC 33.0 32.2 31.6  RDW 17.3* 18.6* 18.6*  PLT 161 168 166    Cardiac Enzymes  Recent Labs Lab 11/03/16  2239 11/04/16 0313 11/04/16 0953  TROPONINI 0.28* 0.27* 0.20*     Recent Labs Lab 11/03/16 1358 11/03/16 1732  TROPIPOC 0.11* 0.13*     BNP  Recent Labs Lab 11/04/16 1532  BNP 1,048.9*    Lab Results  Component Value Date   INR 1.58 11/09/2016   INR 1.78 11/08/2016   INR 2.20 11/07/2016    Cardiac Studies   - Left ventricle: The cavity size was moderately dilated. There was   moderate concentric hypertrophy. Systolic function was normal.   The estimated ejection fraction was in the range of 50% to 55%.   Wall motion was normal; there were no regional wall motion   abnormalities. Doppler parameters are consistent with restrictive   physiology, indicative of decreased left ventricular diastolic   compliance and/or increased left atrial pressure. Doppler   parameters are consistent with elevated ventricular end-diastolic   filling pressure. - Ventricular septum: Septal motion showed paradox. - Aortic valve: Trileaflet; moderately thickened, moderately   calcified leaflets. Valve mobility was restricted. Transvalvular   velocity was within the normal range. There was no stenosis.   There was mild  regurgitation. - Mitral valve: Calcified annulus. Mildly thickened leaflets .   There was mild regurgitation. - Left atrium: The atrium was severely dilated. - Right ventricle: The cavity size was moderately dilated. Wall   thickness was normal. Systolic function was mildly reduced. - Right atrium: The atrium was severely dilated. Pacer wire or   catheter noted in right atrium. - Tricuspid valve: There was moderate-severe regurgitation. - Pulmonic valve: There was mild regurgitation. - Pulmonary arteries: Systolic pressure was moderately increased.   PA peak pressure: 43 mm Hg (S). - Inferior vena cava: The vessel was dilated. The respirophasic   diameter changes were blunted (< 50%), consistent with elevated   central venous pressure.  Patient Profile     83 y.o. male with chronic diastolic heart failure, OSA on CPAP, chronic atrial fibrillation, CAD presents with volume overload, coagulopathy, acute renal insufficiency, hyperkalemia with uncertain cause (recent Bactrim Rx). Developed delirium and generalized pruritus without rash after admission.  Assessment & Plan    1. CHF:  No change in LV function by echo, same signs of RV dysfunction due to cor pulmonale.  Right heart failure is probably the dominant abnormality. Continue CPAP. Change to oral lasix. Pt was probably getting too much water and Na. Nutrition consult.  2. AFib: permanent, slow response with high prevalence of V pacing even without rate control meds, unchanged.   3. PPM: mostly V paced.  4. Coagulopathy: Peak INR 5.41, presumably explained by passive liver congestion. Improving, as are LFTs  5. Acute renal insuff:  BUN/Cr 84/3.41 on admit. Appears he had ATN, possibly due to hypotension, possibly also Trimethoprim related. No obstruction on [place]. Renal function continues to improve. Transition to oral lasix today. He was on 40 mg qd pta, currently on 80 mg IV BID, will start w/ 40 mg bid and follow. Renal has signed  off.  6. CAD: no angina. Troponin elevation is mild and unchanging, not consistent with ACS.  7. Anemia: duodenal ulcer noted on EGD; continue protonix; resume coumadin in a few days per GI.  Signed, Rosaria Ferries, PA-C  11/09/2016, 10:48 AM   As above, patient seen and examined. He denies dyspnea or chest pain. Renal function continues to improve. We will change Lasix back to 40 mg daily which was his home dose with additional 40 mg daily as needed for  weight gain of 2-3 pounds or increasing edema. He will need his potassium and renal function checked 1 week after discharge. Would resume Coumadin for atrial fibrillation on April 6 and have his INR checked in Coumadin clinic on April 10. TOC appt one week after DC and then FU with Dr [name]. Kirk Ruths

## 2016-11-10 LAB — BPAM RBC
BLOOD PRODUCT EXPIRATION DATE: 201804102359
BLOOD PRODUCT EXPIRATION DATE: 201804242359
ISSUE DATE / TIME: 201804031637
ISSUE DATE / TIME: 201804011607
UNIT TYPE AND RH: 600
Unit Type and Rh: 9500

## 2016-11-10 LAB — BASIC METABOLIC PANEL
ANION GAP: 8 (ref 5–15)
BUN: 49 mg/dL — ABNORMAL HIGH (ref 6–20)
CALCIUM: 8.8 mg/dL — AB (ref 8.9–10.3)
CO2: 34 mmol/L — AB (ref 22–32)
CREATININE: 1.84 mg/dL — AB (ref 0.61–1.24)
Chloride: 101 mmol/L (ref 101–111)
GFR calc Af Amer: 37 mL/min — ABNORMAL LOW (ref >60)
GFR, EST NON AFRICAN AMERICAN: 32 mL/min — AB (ref >60)
GLUCOSE: 87 mg/dL (ref 65–99)
Potassium: 4.1 mmol/L (ref 3.5–5.1)
Sodium: 143 mmol/L (ref 135–145)

## 2016-11-10 LAB — CBC
HCT: 26.4 % — ABNORMAL LOW (ref 39.0–52.0)
Hemoglobin: 8.6 g/dL — ABNORMAL LOW (ref 13.0–17.0)
MCH: 32.8 pg (ref 26.0–34.0)
MCHC: 32.6 g/dL (ref 30.0–36.0)
MCV: 100.8 fL — ABNORMAL HIGH (ref 78.0–100.0)
PLATELETS: 160 10*3/uL (ref 150–400)
RBC: 2.62 MIL/uL — ABNORMAL LOW (ref 4.22–5.81)
RDW: 19.7 % — AB (ref 11.5–15.5)
WBC: 4.1 10*3/uL (ref 4.0–10.5)

## 2016-11-10 LAB — TYPE AND SCREEN
ABO/RH(D): A NEG
ANTIBODY SCREEN: NEGATIVE
Unit division: 0
Unit division: 0

## 2016-11-10 LAB — GLUCOSE, CAPILLARY: GLUCOSE-CAPILLARY: 83 mg/dL (ref 65–99)

## 2016-11-10 LAB — PROTIME-INR
INR: 1.34
PROTHROMBIN TIME: 16.7 s — AB (ref 11.4–15.2)

## 2016-11-10 NOTE — Care Management Note (Signed)
Case Management Note  Patient Details  Name: [name] MRN: [id number] Date of Birth: 06/27/1932  Action/Plan: CM talked to patient and son about [place] choices, patient chose [place]; Butch Penny with [facility] called for arrangements; No DME needed at this time  Expected Discharge Date:  11/10/16               Expected Discharge Plan:  [place]  Discharge planning Services  CM Consult  Choice offered to:  Patient, Adult Children  HH Arranged:  RN, Disease Management, PT, OT, Nurse's Aide [place] Agency:  [place]  Status of Service:  In process, will continue to follow  Sherrilyn Rist [phone] 11/10/2016, 11:44 AM

## 2016-11-10 NOTE — Discharge Instructions (Signed)
2000 mg sodium diet (500 mg per meal) and 1.5 Liters or 1 and 1/2 quarts of all liquids (mostly water) daily.   Please note that Coumadin is to be restarted by 11/12/2016. Please note that since you have been off Coumadin, risk of stroke increases. Pay close attention to any symptoms of weakness especially if it involves only one side of the body, confusion, speech problems, vision problems.   Acute Kidney Injury, Adult Acute kidney injury is a sudden worsening of kidney function. The kidneys are organs that have several jobs. They filter the blood to remove waste products and extra fluid. They also maintain a healthy balance of minerals and hormones in the body, which helps control blood pressure and keep bones strong. With this condition, your kidneys do not do their jobs as well as they should. This condition ranges from mild to severe. Over time it may develop into long-lasting (chronic) kidney disease. Early detection and treatment may prevent acute kidney injury from developing into a chronic condition. What are the causes? Common causes of this condition include:  A problem with blood flow to the kidneys. This may be caused by:  Low blood pressure (hypotension) or shock.  Blood loss.  Heart and blood vessel (cardiovascular) disease.  Severe burns.  Liver disease.  Direct damage to the kidneys. This may be caused by:  Certain medicines.  A kidney infection.  Poisoning.  Being around or in contact with toxic substances.  A surgical wound.  A hard, direct hit to the kidney area.  A sudden blockage of urine flow. This may be caused by:  Cancer.  Kidney stones.  An enlarged prostate in males. What are the signs or symptoms? Symptoms of this condition may not be obvious until the condition becomes severe. Symptoms of this condition can include:  Tiredness (lethargy), or difficulty staying awake.  Nausea or vomiting.  Swelling (edema) of the face, legs, ankles, or  feet.  Problems with urination, such as:  Abdominal pain, or pain along the side of your stomach (flank).  Decreased urine production.  Decrease in the force of urine flow.  Muscle twitches and cramps, especially in the legs.  Confusion or trouble concentrating.  Loss of appetite.  Fever. How is this diagnosed? This condition may be diagnosed with tests, including:  Blood tests.  Urine tests.  Imaging tests.  A test in which a sample of tissue is removed from the kidneys to be examined under a microscope (kidney biopsy). How is this treated? Treatment for this condition depends on the cause and how severe the condition is. In mild cases, treatment may not be needed. The kidneys may heal on their own. In more severe cases, treatment will involve:  Treating the cause of the kidney injury. This may involve changing any medicines you are taking or adjusting your dosage.  Fluids. You may need specialized IV fluids to balance your body's needs.  Having a catheter placed to drain urine and prevent blockages.  Preventing problems from occurring. This may mean avoiding certain medicines or procedures that can cause further injury to the kidneys. In some cases treatment may also require:  A procedure to remove toxic wastes from the body (dialysis or continuous renal replacement therapy - CRRT).  Surgery. This may be done to repair a torn kidney, or to remove the blockage from the urinary system. Follow these instructions at home: Medicines   Take over-the-counter and prescription medicines only as told by your health care provider.  Do  not take any new medicines without your health care provider's approval. Many medicines can worsen your kidney damage.  Do not take any vitamin and mineral supplements without your health care provider's approval. Many nutritional supplements can worsen your kidney damage. Lifestyle   If your health care provider prescribed changes to your  diet, follow them. You may need to decrease the amount of protein you eat.  Achieve and maintain a healthy weight. If you need help with this, ask your health care provider.  Start or continue an exercise plan. Try to exercise at least 30 minutes a day, 5 days a week.  Do not use any tobacco products, such as cigarettes, chewing tobacco, and e-cigarettes. If you need help quitting, ask your health care provider. General instructions   Keep track of your blood pressure. Report changes in your blood pressure as told by your health care provider.  Stay up to date with immunizations. Ask your health care provider which immunizations you need.  Keep all follow-up visits as told by your health care provider. This is important. Where to find more information:  American Association of Kidney Patients: BombTimer.gl  National Kidney Foundation: www.kidney.[place]: https://mathis.com/  [employer] [facility]:  www.lifeoptions.org  www.kidneyschool.org Contact a health care provider if:  Your symptoms get worse.  You develop new symptoms. Get help right away if:  You develop symptoms of worsening kidney disease, which include:  Headaches.  Abnormally dark or light skin.  Easy bruising.  Frequent hiccups.  Chest pain.  Shortness of breath.  End of menstruation in women.  Seizures.  Confusion or altered mental status.  Abdominal or back pain.  Itchiness.  You have a fever.  Your body is producing less urine.  You have pain or bleeding when you urinate. Summary  Acute kidney injury is a sudden worsening of kidney function.  Acute kidney injury can be caused by problems with blood flow to the kidneys, direct damage to the kidneys, and sudden blockage of urine flow.  Symptoms of this condition may not be obvious until it becomes severe. Symptoms may include edema, lethargy, confusion, nausea or vomiting, and problems passing urine.  This  condition can usually be diagnosed with blood tests, urine tests, and imaging tests. Sometimes a kidney biopsy is done to diagnose this condition.  Treatment for this condition often involves treating the underlying cause. It is treated with fluids, medicines, dialysis, diet changes, or surgery. This information is not intended to replace advice given to you by your health care provider. Make sure you discuss any questions you have with your health care provider. Document Released: 01-24-202012 Document Revised: 07/16/2016 Document Reviewed: 07/16/2016 Elsevier Interactive Patient Education  2017 [employer].

## 2016-11-10 NOTE — Discharge Summary (Signed)
Physician Discharge Summary  [name] JSH:[id number] DOB: 09/23/1931 DOA: 11/03/2016  PCP: [name], MD  Admit date: 11/03/2016 Discharge date: 11/10/2016  Recommendations for Outpatient Follow-up:  1. Pt will need to follow up with PCP in 1 weeks post discharge 2. Please obtain BMP to evaluate electrolytes and kidney function, K level  3. Please also check CBC to evaluate Hg and Hct levels 4. Stop avapro due to AKI and hyperkalemia  5. Stop fenofibrate until renal function stabilizes 6. Also please note potassium was stopped until pt has it rechecked and can be restarted as indicated  7. Ok to start Coumadin on April 6th, 2018, must have PT/INR checked in Coumadin clinic on April 10. TOC appt one week after DC and then FU with Dr [name] 8. Scalp abscess with MRSA+, Keflex changed to doxycycline and pt to complete for 5 more days post discharge   Discharge Diagnoses:  Principal Problem:   Acute delirium Active Problems:   ARF (acute renal failure) (HCC)   Atrial fibrillation, persistent, slow VR  Discharge Condition: Stable  Diet recommendation: Heart healthy diet discussed in details   Brief Narrative:  a 83 y.o.malewith a-fib on coumadin, MI, non ischemic cardiomyopathy, s/p pacemaker, HTN, BPH (chronic urinary retention and taking Dutasteride and Tamsulosin), presented earlier to PCP office for dyspnea and was referred to ED due to multiple blood work abnormalities. Pt reports ongoing dyspnea for several days, poor oral intake and fatigue. No active chest pain, no urinary concerns. Pt explains he has had some "heaviness in the stomach" and felt full and was not able to eat. He does have varicose veins RLE and has ACE wrap but says earlier in the day he had lots of bleeding from the varicose veins, 6 episodes of significant blood loss, unable to quantify. Pt reports having recent scalp abscess that was lanced and tested + for staph, he has been on antibiotic for it.    ED Course:In ED, pt noted to be hemodynamically stable, VS notable for HR in 50-60's, initial oxygen saturation 88% on RA and pt placed on oxygen via [place], O2 sat up to 100%. Blood work notable for Hg 7.6, K 6 -->6.3, Cr 3.35 -->3.36. INR 5. Pt give dose of calcium gluconate and TRH asked to admit to step down unit due to severe electrolyte derangements.   Assessment & Plan:  Principal Problem: ARF (acute renal failure) ([place]) imposed on CKD stage III - imposed on CKD stage III, GFR in 50's with stable Cr in January 2016, last report in 09/2016 Cr was 1.5  - likely multifactorial in etiology, pre renal --> ATN in the setting of bleeding, lasix use, losartan use, poor oral intake  - ARB's still on hold, Lasix done increased from 40 mg --> 80 IV BID started 3/29 - now back on home regimen with lasix 40 mg PO daily with additional dose to be used if needed based on weight gain and swelling  - Cr is now trending down from 3.39 --> 3.53 --> 4.01 --> 3.5 --> 2.83 --> 1.94 --> 1.84 - this is the weight trend in the past 48 hours  Filed Weights   11/08/16 0615 11/09/16 0732 11/10/16 0453  Weight: 100.6 kg (221 lb 12.8 oz) 105.9 kg (233 lb 8 oz) 103.5 kg (228 lb 1.6 oz)  - appreciate nephrology team assistance   Active Problems: Hyperkalemia, hyperphosphatemia  - gave two doses of Ca gluconate in ED - also has gotten kayexalate x  2 on 3/29, given one dose AM 3/30, gave one more dose 15 gm 3/31 - K trend: 5.7 --> 5.8 --> 6.4 --> 6 --> 5.5 --> 3.5 --> 3.8 --> 4.1 this AM  - needs repeat BMP in one week   Acute blood loss anemia - in pt on coumadin, had some bleeding from varicose veins, RLE but not sure if that is the only source  - coumadin was held since admission, INR trending down: 5 --> 5.41 --> 4.26 --> 2.81 --> 2.3 --> 1.78 --> 1.58 --> 1.34 - FOBT +, anemia panel with low iron  - EGD done AM 4/2 -->   Erosive gastropathy.  One duodenal ulcer.  Resume regular  diet.  Continue present medications.  Use Protonix (pantoprazole) 40 mg PO daily.  Coumadin could be resumed 4/6, provided that pt is on a PPI for a few days before to allow some healing of the duodenal ulcer. - Hg is down 4/3 AM 8.2 --> 7.7 - pt transfused one U PRBC on 4/3 and Hg is appropriately up to 8.6  Dyspnea due to Acute on Chronic diastolic heart failure ([place]) - last ECHO in 2016 with stable EF - paced rhythm - ECHO on this admission with EF 50 - 55% - pt started on Lasix 40 mg IV BID, increased the dose to 80 mg IV BID on 3/30 - pt now on lasix 40 mg PO QD per cardiology, renal team also in agreement, can use additional dose if needed for weight gain of more than 2-3 lbs - weight trend since admission     Transaminitis  - ? Congestive etiology  - RUQ [place] with gallbladder wall thickening can be explained by congestive etiology  - overall trending down     RLE wound, varicose vein, venous stasis ulcer  - wound care consulted for assistance  - overall stable, healing   Atrial fibrillation, permanent, slow VR, on chronic anticoagulation  Pacemaker single chamber, [employer], 2013   Elevated troponin, demand ischemia in the setting of ARF  - INR supra therapeutic on admission and coumadin has been held - INR overall trending down but will resume in few days per GI doctor recommendation due to duodenal ulcer noted on EGD - plan to resume Coumadin on 4/6  BPH (benign prostatic hyperplasia) - resumed home medical regimen Tamsulosin and Dutasteride     Recent staph infection, scalp abscess - continue doxycycline to complete therapy   Depression - per psych, started Lexapro 5 mg daily to help the depression - can consider giving Seroquel 25 mg every 6 when necessary for agitation and hallucination, in the future but for now daughter wants to hold off on more medications     Obesity  - Body mass index is 38.94 kg/m.    OSA - on CPAP at  night time   DVT prophylaxis: Coumadin on hold, SCD's requested  Code Status: Full  Family Communication: Patient at bedside, family updated at bedside  Disposition Plan: Home   Consultants:   Cardiology  Nephrology  WOC  GI  Psych   Procedures:   EGD 4/2 - duodenal ulcer, please see detail above   Antimicrobials:   Scalp abscess on previous admission drained, + MRSA pt to continue doxycycline on discharge   Procedures/Studies: Ct Head Wo Contrast  Result Date: 11/06/2016 CLINICAL DATA:  Confusion. EXAM: CT HEAD WITHOUT CONTRAST TECHNIQUE: Contiguous axial images were obtained from the base of the skull through the vertex without intravenous  contrast. COMPARISON:  August 20, 2014 FINDINGS: Brain: No subdural, epidural, or subarachnoid hemorrhage. Low-attenuation in the left cerebellar hemisphere on series 3, image 9 is identified. There is streak artifact in this region. There is no associated mass effect on the fourth ventricle. The cerebellum, brainstem, and basal cisterns are otherwise normal. Ventricles and sulci are mildly prominent but stable. White matter changes are moderate and similar in the interval. A lacunar infarct in the left basal ganglia is unchanged. Low-attenuation through the left temporal lobe is thought to be due to streak artifact. No other sites of ischemia or infarct are identified. No mass, mass effect, or midline shift. Vascular: Calcified atherosclerosis is seen in the intracranial carotid arteries. Skull: Normal. Negative for fracture or focal lesion. Sinuses/Orbits: No acute finding. Other: None. IMPRESSION: 1. There is low-attenuation in the left cerebellar hemisphere. The finding could be at least partially artifactual, due to streak artifact and beam hardening. However, an age-indeterminate infarct is not excluded on this study. Recommend clinical correlation. An MRI could further assess if clinically warranted. 2. No bleed identified.  No other  acute abnormalities. These results will be called to the ordering clinician or representative by the Radiologist Assistant, and communication documented in the PACS or zVision Dashboard. Electronically Signed   By: Dorise Bullion III M.D   On: 11/06/2016 09:01   Ct Chest Wo Contrast  Result Date: 11/04/2016 CLINICAL DATA:  Dyspnea and fatigue for several days.  Hypertension EXAM: CT CHEST WITHOUT CONTRAST TECHNIQUE: Multidetector CT imaging of the chest was performed following the standard protocol without IV contrast. COMPARISON:  Chest radiograph November 03, 2016 FINDINGS: Cardiovascular: There it is no appreciable thoracic aortic aneurysm. There is moderate calcification at the origins of the great vessels. There is atherosclerotic calcification in the aorta. There are multiple foci of coronary artery calcification. Pacemaker lead is attached to the right ventricle. There is cardiomegaly. Pericardium is not appreciably thickened. Main pulmonary outflow tract measures 3.8 cm in diameter. There is calcification in the left ventricular wall anteriorly. Mediastinum/Nodes: There is a focal area of benign-appearing calcification in the left thyroid. Thyroid otherwise appears unremarkable. There is no appreciable thoracic adenopathy. Lungs/Pleura: There is a small pleural effusion on the right. There is bibasilar atelectatic change. There is also slight atelectasis in the posterior segments of each upper lobe. There are a few small scattered bullae in the upper lobes posteriorly. There is no edema or consolidation. On axial slice 64 series 8, there is a 4 mm nodular opacity in the posterior segment of the right upper lobe. There is mild lower lobe bronchiectatic change bilaterally. Upper Abdomen: There is atherosclerotic calcification in the abdominal aorta. There are cysts arising from each kidney, incompletely visualized. The largest of these cysts is on the left, measuring approximately 5 x 5 cm. Cyst arising from  the posterior upper pole of the right kidney measures 3.2 x 3.1 cm. Musculoskeletal: There is degenerative change throughout the thoracic spine. There are no blastic or lytic bone lesions. IMPRESSION: Small right-sided pleural effusion. Patchy bibasilar atelectasis. No edema or consolidation. There is a 4 mm nodular opacity in the posterior segment of the right upper lobe. No follow-up needed if patient is low-risk. Non-contrast chest CT can be considered in 12 months if patient is high-risk. This recommendation follows the consensus statement: Guidelines for Management of Incidental Pulmonary Nodules Detected on CT Images: From the Fleischner Society 2017; Radiology 2017; 284:228-243. Cardiomegaly. Multiple foci of atherosclerotic calcification including areas of coronary artery  calcification. Enlargement of the main pulmonary outflow tract suggests a degree of underlying pulmonary hypertension. Calcification in the left ventricular wall is likely indicative of prior myocardial infarct. No evident adenopathy. Electronically Signed   By: Lowella Grip III M.D.   On: 11/04/2016 10:30   US Renal  Result Date: 11/04/2016 CLINICAL DATA:  Acute renal failure EXAM: RENAL / URINARY TRACT ULTRASOUND COMPLETE COMPARISON:  Abdominal ultrasound 01/25/2006 FINDINGS: Right Kidney: Length: 13.8 cm. 4.3 cm right midpole cyst, 4 cm right upper pole cyst. Echogenicity within normal limits. No mass or hydronephrosis visualized. Left Kidney: Length: 13.8 cm. 7 cm left lower pole cyst. 3 cm and 2 cm central cysts. Echogenicity within normal limits. No mass or hydronephrosis visualized. Bladder: Negative IMPRESSION: Bilateral renal cysts.  No renal obstruction.  Normal renal size. Electronically Signed   By: Franchot Gallo M.D.   On: 11/04/2016 16:39   Dg Chest Port 1 View  Result Date: 11/03/2016 CLINICAL DATA:  Shortness of Breath EXAM: PORTABLE CHEST 1 VIEW COMPARISON:  08/19/2014 FINDINGS: Cardiomegaly is noted. Central  mild vascular congestion without convincing pulmonary edema. Elevation of the right hemidiaphragm again noted. Bilateral basilar atelectasis. Single lead cardiac pacemaker is unchanged in position. No segmental infiltrate. IMPRESSION: Cardiomegaly. Central mild vascular congestion without convincing pulmonary edema. Mild basilar atelectasis. No segmental infiltrate. Electronically Signed   By: Lahoma Crocker M.D.   On: 11/03/2016 16:14   US Abdomen Limited Ruq  Result Date: 11/04/2016 CLINICAL DATA:  Transaminitis EXAM: US ABDOMEN LIMITED - RIGHT UPPER QUADRANT COMPARISON:  Ultrasound 01/25/2006 FINDINGS: Gallbladder: Negative for gallstones. Gallbladder wall thickened 5.2 mm. Negative sonographic Murphy sign. Common bile duct: Diameter: 2.6 mm Liver: Increased echogenicity liver diffusely most compatible with fatty infiltration. No focal liver lesion. No ascites in the right upper quadrant IMPRESSION: Gallbladder wall thickening without gallstones or positive Murphy sign. Possible acute or chronic cholecystitis. Also possible causes of gallbladder wall thickening would include liver failure and heart failure. Echogenic liver compatible with fatty infiltration. Electronically Signed   By: Franchot Gallo M.D.   On: 11/04/2016 16:41    Discharge Exam: Vitals:   11/09/16 1935 11/10/16 0453  BP: (!) 109/54 (!) 110/53  Pulse: 60 (!) 59  Resp: 16 16  Temp: 98.9 F (37.2 C) 98.6 F (37 C)   Vitals:   11/09/16 1634 11/09/16 1703 11/09/16 1935 11/10/16 0453  BP: (!) 136/57 (!) 96/46 (!) 109/54 (!) 110/53  Pulse: (!) 59 60 60 (!) 59  Resp: 18 18 16 16   Temp: 98.1 F (36.7 C) 98 F (36.7 C) 98.9 F (37.2 C) 98.6 F (37 C)  TempSrc: Oral Oral Oral Oral  SpO2:   95% 93%  Weight:    103.5 kg (228 lb 1.6 oz)  Height:        General: Pt is alert, follows commands appropriately, not in acute distress Cardiovascular: Irregular rate and rhythm, S1/S2 +, no rubs, no gallops Respiratory: no wheezing, no  crackles, no rhonchi, diminished breath sounds at bases  Abdominal: Soft, non tender, non distended, bowel sounds +, no guarding Extremities:  no cyanosis, pulses palpable bilaterally DP and PT  Discharge Instructions  Discharge Instructions    Diet - low sodium heart healthy    Complete by:  As directed    Increase activity slowly    Complete by:  As directed      Allergies as of 11/10/2016      Reactions   Vicodin [hydrocodone-acetaminophen] Nausea And Vomiting  Hydrocodone Nausea Only      Medication List    STOP taking these medications   cephALEXin 500 MG capsule Commonly known as:  KEFLEX   fenofibrate 160 MG tablet   irbesartan 150 MG tablet Commonly known as:  AVAPRO   potassium chloride SA 20 MEQ tablet Commonly known as:  K-DUR,KLOR-CON     TAKE these medications   acetaminophen 650 MG CR tablet Commonly known as:  TYLENOL Take 650 mg by mouth every 8 (eight) hours as needed for pain.   allopurinol 100 MG tablet Commonly known as:  ZYLOPRIM Take 2 tablets (200 mg total) by mouth daily. What changed:  medication strength  how much to take   COLCRYS 0.6 MG tablet Generic drug:  colchicine Take 0.6 mg by mouth as needed (flareups).   doxycycline 100 MG tablet Commonly known as:  VIBRA-TABS Take 1 tablet (100 mg total) by mouth every 12 (twelve) hours.   dutasteride 0.5 MG capsule Commonly known as:  AVODART Take 0.5 mg by mouth daily.   escitalopram 5 MG tablet Commonly known as:  LEXAPRO Take 1 tablet (5 mg total) by mouth at bedtime.   furosemide 40 MG tablet Commonly known as:  LASIX Take 1 tablet (40 mg total) by mouth daily. Can use additional tablet in a day if needed for swelling or weight gain of 2-3 lbs in 24 hours What changed:  medication strength  additional instructions   hydrOXYzine 25 MG tablet Commonly known as:  ATARAX/VISTARIL Take 1 tablet (25 mg total) by mouth every 6 (six) hours as needed for itching.    loratadine 10 MG tablet Commonly known as:  CLARITIN Take 10 mg by mouth daily.   mirtazapine 15 MG tablet Commonly known as:  REMERON Take 15 mg by mouth at bedtime.   nitroGLYCERIN 0.4 MG SL tablet Commonly known as:  NITROSTAT Place 0.4 mg under the tongue every 5 (five) minutes as needed. Chest pain   OMEGA-3 PO Take 1 capsule by mouth 2 (two) times daily.   pantoprazole 40 MG tablet Commonly known as:  PROTONIX Take 1 tablet (40 mg total) by mouth daily.   PRESERVISION AREDS 2 Caps Take 1 capsule by mouth daily.   rOPINIRole 3 MG tablet Commonly known as:  REQUIP Take 1 tablet by mouth daily. Take 1 tab daily   STOOL SOFTENER PO Take 2 tablets by mouth at bedtime.   tamsulosin 0.4 MG Caps capsule Commonly known as:  FLOMAX Take 0.4 mg by mouth daily after breakfast.   traMADol 50 MG tablet Commonly known as:  ULTRAM Take 50 mg by mouth 4 (four) times daily. For pain   warfarin 5 MG tablet Commonly known as:  COUMADIN Take 1 tablet (5 mg total) by mouth daily. Start taking on 11/12/2016, you must have PT/INR What changed:  additional instructions       Follow-up Information    Kerin Ransom, PA-C Follow up on 11/23/2016.   Specialties:  Cardiology, Radiology Why:  Please arrive at 10:45 am for an 11:00 appointment. Contact information: [address] [place] [address] [phone]        [name], MD Follow up.   Specialty:  Internal Medicine Contact information: [place] [address] [phone]            The results of significant diagnostics from this hospitalization (including imaging, microbiology, ancillary and laboratory) are listed below for reference.     Microbiology: Recent Results (  from the past 240 hour(s))  MRSA PCR Screening     Status: Abnormal   Collection Time: 11/04/16 12:39 AM  Result Value Ref Range Status   MRSA by PCR POSITIVE (A) NEGATIVE Final    Comment:        The  GeneXpert MRSA Assay (FDA approved for NASAL specimens only), is one component of a comprehensive MRSA colonization surveillance program. It is not intended to diagnose MRSA infection nor to guide or monitor treatment for MRSA infections. RESULT CALLED TO, READ BACK BY AND VERIFIED WITH: CAITLYN,RN  ON 4N @0241  11/04/16 MKELLY,MLT      Labs: Basic Metabolic Panel:  Recent Labs Lab 11/03/16 2239  11/07/16 0608 11/07/16 1409 11/08/16 0609 11/09/16 0338 11/10/16 0547  NA 136  < > 136 138 142 140 143  K 5.8*  < > 3.9 3.8 3.8 3.8 4.1  CL 102  < > 101 103 102 100* 101  CO2 23  < > 22 29 32 33* 34*  GLUCOSE 109*  < > 87 122* 81 103* 87  BUN 85*  < > 96* 92* 79* 67* 49*  CREATININE 3.22*  < > 2.83* 2.55* 1.94* 1.64* 1.84*  CALCIUM 9.2  < > 9.0 8.7* 9.0 8.7* 8.8*  MG 2.3  --   --   --   --   --   --   PHOS 6.1*  --  4.1  --   --   --   --   < > = values in this interval not displayed. Liver Function Tests:  Recent Labs Lab 11/05/16 0236 11/06/16 0209 11/07/16 0608 11/07/16 1409 11/08/16 0609 11/09/16 0338  AST 176* 433*  --  238* 197* 142*  ALT 109* 222*  --  175* 158* 129*  ALKPHOS 52 51  --  50 49 46  BILITOT 1.2 1.7*  --  1.9* 2.3* 1.5*  PROT 6.7 6.5  --  6.6 6.8 6.2*  ALBUMIN 3.0* 3.1* 3.2* 3.0* 3.0* 2.8*    Recent Labs Lab 11/03/16 1640  LIPASE 16    Recent Labs Lab 11/06/16 0712 11/08/16 0609  AMMONIA 58* 67*   CBC:  Recent Labs Lab 11/06/16 0209 11/07/16 0608 11/08/16 0609 11/09/16 0338 11/10/16 0547  WBC 7.5 5.4 4.7 4.6 4.1  HGB 7.5* 7.5* 8.2* 7.7* 8.6*  HCT 24.0* 22.7* 25.5* 24.4* 26.4*  MCV 101.3* 99.6 99.6 100.8* 100.8*  PLT 182 161 168 166 160   Cardiac Enzymes:  Recent Labs Lab 11/03/16 2239 11/04/16 0313 11/04/16 0953  TROPONINI 0.28* 0.27* 0.20*   BNP: BNP (last 3 results)  Recent Labs  11/04/16 1532  BNP 1,048.9*   CBG:  Recent Labs Lab 11/09/16 0750 11/09/16 1142 11/09/16 1604 11/09/16 2132 11/10/16 0746   GLUCAP 86 99 144* 131* 83   SIGNED: Time coordinating discharge:  30 minutes  MAGICK-Aquarius Latouche, MD  [facility] 11/10/2016, 10:36 AM Pager [phone]  If 7PM-7AM, please contact night-coverage [email or web address] Password [email or web address]

## 2016-11-11 ENCOUNTER — Encounter (HOSPITAL_COMMUNITY): Payer: Self-pay | Admitting: Gastroenterology

## 2016-11-15 DIAGNOSIS — Z7901 Long term (current) use of anticoagulants: Secondary | ICD-10-CM | POA: Diagnosis not present

## 2016-11-15 DIAGNOSIS — E875 Hyperkalemia: Secondary | ICD-10-CM | POA: Diagnosis not present

## 2016-11-15 DIAGNOSIS — N183 Chronic kidney disease, stage 3 (moderate): Secondary | ICD-10-CM | POA: Diagnosis not present

## 2016-11-19 ENCOUNTER — Ambulatory Visit: Payer: Medicare Other | Admitting: Cardiology

## 2016-11-23 ENCOUNTER — Encounter: Payer: Self-pay | Admitting: Cardiology

## 2016-11-23 ENCOUNTER — Ambulatory Visit (INDEPENDENT_AMBULATORY_CARE_PROVIDER_SITE_OTHER): Payer: Medicare Other | Admitting: Cardiology

## 2016-11-23 VITALS — BP 116/63 | HR 61 | Ht 67.0 in | Wt 221.0 lb

## 2016-11-23 DIAGNOSIS — I482 Chronic atrial fibrillation, unspecified: Secondary | ICD-10-CM | POA: Insufficient documentation

## 2016-11-23 DIAGNOSIS — I2721 Secondary pulmonary arterial hypertension: Secondary | ICD-10-CM

## 2016-11-23 DIAGNOSIS — Z7901 Long term (current) use of anticoagulants: Secondary | ICD-10-CM

## 2016-11-23 DIAGNOSIS — Z79899 Other long term (current) drug therapy: Secondary | ICD-10-CM

## 2016-11-23 DIAGNOSIS — K922 Gastrointestinal hemorrhage, unspecified: Secondary | ICD-10-CM

## 2016-11-23 DIAGNOSIS — N289 Disorder of kidney and ureter, unspecified: Secondary | ICD-10-CM | POA: Diagnosis not present

## 2016-11-23 DIAGNOSIS — I5033 Acute on chronic diastolic (congestive) heart failure: Secondary | ICD-10-CM

## 2016-11-23 DIAGNOSIS — I428 Other cardiomyopathies: Secondary | ICD-10-CM | POA: Diagnosis not present

## 2016-11-23 DIAGNOSIS — Z950 Presence of cardiac pacemaker: Secondary | ICD-10-CM

## 2016-11-23 NOTE — Assessment & Plan Note (Signed)
Pt admitted 11/03/16 with acute on chronic diastolic CHF, acute renal insufficiency, and coagulopathy with GI bleed requiring transfusion

## 2016-11-23 NOTE — Assessment & Plan Note (Signed)
Seen by nephrologist during recent admission

## 2016-11-23 NOTE — Patient Instructions (Signed)
Medication Instructions: no change   Labwork:  CBC, BMET today at [facility] lab site (you do not need to be fasting for this test)   Testing/Procedures: none   Follow-Up: 3 months with Dr. [name]   If you need a refill on your cardiac medications before your next appointment, please call your pharmacy.

## 2016-11-23 NOTE — Progress Notes (Signed)
11/23/2016 [name]   12/12/1931  [id number]  Primary Physician [name], MD Primary Cardiologist: Dr [name]  HPI:  83 y/o male followed by Dr [name] with a history of CAF on Coumadin, SSS, s/p PTVDP in 2013, past NICM (normal coronaries in 2010), OSA on C-pap, and chronic venous varicosities. The pt was admitted 11/03/16 with multiple problems. It was felt he had acute diastolic CHF from self induced over hydration. He has has acute renal insufficiency (SCr 3.35) and hyperkalemia (K+ 6.4), anemia, and coagulopathy. This was felt to be secondary to OP antibiotics (Septra). The pt was admitted and diuresed. An echo confirmed his EF to be 55%. His renal function gradually improved. GI work revealed PUD and he was transfused and placed on PPI. He is in the office today for follow up. He says he is doing well, no unusual dyspnea. His main complaint is chronic  LE edem an varicosities. He wants clearance to get laser treatment for this.    Current Outpatient Prescriptions  Medication Sig Dispense Refill  . acetaminophen (TYLENOL) 650 MG CR tablet Take 650 mg by mouth every 8 (eight) hours as needed for pain.    [name] allopurinol (ZYLOPRIM) 100 MG tablet Take 2 tablets (200 mg total) by mouth daily. 30 tablet 1  . COLCRYS 0.6 MG tablet Take 0.6 mg by mouth as needed (flareups).     [name] Calcium (STOOL SOFTENER PO) Take 2 tablets by mouth at bedtime.     . dutasteride (AVODART) 0.5 MG capsule Take 0.5 mg by mouth daily.    [name] escitalopram (LEXAPRO) 5 MG tablet Take 1 tablet (5 mg total) by mouth at bedtime. 30 tablet 0  . furosemide (LASIX) 40 MG tablet Take 1 tablet (40 mg total) by mouth daily. Can use additional tablet in a day if needed for swelling or weight gain of 2-3 lbs in 24 hours 30 tablet   . hydrOXYzine (ATARAX/VISTARIL) 25 MG tablet Take 1 tablet (25 mg total) by mouth every 6 (six) hours as needed for itching. 30 tablet 0  . loratadine (CLARITIN) 10 MG tablet Take  10 mg by mouth daily.    . mirtazapine (REMERON) 15 MG tablet Take 15 mg by mouth at bedtime.    . Multiple Vitamins-Minerals (PRESERVISION AREDS 2) CAPS Take 1 capsule by mouth daily.    . nitroGLYCERIN (NITROSTAT) 0.4 MG SL tablet Place 0.4 mg under the tongue every 5 (five) minutes as needed. Chest pain    . Omega-3 Fatty Acids (OMEGA-3 PO) Take 1 capsule by mouth 2 (two) times daily.    . pantoprazole (PROTONIX) 40 MG tablet Take 1 tablet (40 mg total) by mouth daily. 30 tablet 1  . rOPINIRole (REQUIP) 3 MG tablet Take 1 tablet by mouth daily. Take 1 tab daily    . Tamsulosin HCl (FLOMAX) 0.4 MG CAPS Take 0.4 mg by mouth daily after breakfast.    . traMADol (ULTRAM) 50 MG tablet Take 50 mg by mouth 4 (four) times daily. For pain     . warfarin (COUMADIN) 5 MG tablet Take 1 tablet (5 mg total) by mouth daily. Start taking on 11/12/2016, you must have PT/INR     Current Facility-Administered Medications  Medication Dose Route Frequency Provider Last Rate Last Dose  . betamethasone acetate-betamethasone sodium phosphate (CELESTONE) injection 12 mg  12 mg Intramuscular Once Edrick Kins, DPM        Allergies  Allergen Reactions  . Vicodin [Hydrocodone-Acetaminophen]  Nausea And Vomiting  . Hydrocodone Nausea Only    Past Medical History:  Diagnosis Date  . Coronary artery disease   . Diverticulitis   . Gout   . Hyperlipidemia   . MI (myocardial infarction) ([place])   . Nonischemic cardiomyopathy ([facility])    mild  . OSA on CPAP   . Permanent atrial fibrillation ([place])   . Pulmonary hypertension ([place])     Social History   Social History  . Marital status: Married    Spouse name: N/A  . Number of children: N/A  . Years of education: N/A   Occupational History  . Not on file.   Social History Main Topics  . Smoking status: Never Smoker  . Smokeless tobacco: Never Used  . Alcohol use No  . Drug use: No  . Sexual activity: Not on file   Other Topics Concern  . Not on file    Social History Narrative  . No narrative on file     Family History  Problem Relation Age of Onset  . Cancer Mother   . Heart attack Father   . Diabetes Brother      Review of Systems: General: negative for chills, fever, night sweats or weight changes.  Cardiovascular: negative for chest pain, dyspnea on exertion, edema, orthopnea, palpitations, paroxysmal nocturnal dyspnea or shortness of breath Dermatological: negative for rash Respiratory: negative for cough or wheezing Urologic: negative for hematuria Abdominal: negative for nausea, vomiting, diarrhea, bright red blood per rectum, melena, or hematemesis Neurologic: negative for visual changes, syncope, or dizziness All other systems reviewed and are otherwise negative except as noted above.    Blood pressure 116/63, pulse 61, height 5\' 7"  (1.702 m), weight 221 lb (100.2 kg).  General appearance: alert, cooperative, no distress and mildly obese Lungs: clear to auscultation bilaterally and significant kyphoscoliosis Heart: regular rate and rhythm and 2/6 systolic mumur Extremities: chronic venous varicosities, healing ulcer Rt LE Skin: Skin color, texture, turgor normal. No rashes or lesions Neurologic: Grossly normal   ASSESSMENT AND PLAN:   Acute on chronic diastolic (congestive) heart failure ([place]) Pt admitted 11/03/16 with acute on chronic diastolic CHF, acute renal insufficiency, and coagulopathy with GI bleed requiring transfusion  Nonischemic cardiomyopathy - coronaries by angiography 2010 His most recent EF by echo 11/05/16 is normal- EF 55% with mod-severe TR and LAE  PAH (pulmonary artery hypertension) PA pressure 43 March 2018  Chronic atrial fibrillation (HCC) Slow VR- paced  Pacemaker single chamber, [employer], 2013 PTVDP,  single lead, implanted 2013  Chronic anticoagulation Coumadin followed  by Dr Odette Fraction-  Acute upper GI bleeding In the setting of coagulopathy, possibly from  OP antibiotics  Acute renal insufficiency Seen by nephrologist during recent admission   PLAN  I did not change his medications. Check labs-BMP/CBC. OK to have laser Rx from our standpoint. His Coumadin has been resumed. F/U with Dr [name] in 3 months as long as his labs are stable.   Kerin Ransom PA-C 11/23/2016 11:54 AM

## 2016-11-23 NOTE — Assessment & Plan Note (Signed)
Slow VR- paced

## 2016-11-23 NOTE — Assessment & Plan Note (Signed)
Coumadin followed  by Dr Odette Fraction-

## 2016-11-23 NOTE — Assessment & Plan Note (Signed)
PTVDP,  single lead, implanted 2013

## 2016-11-23 NOTE — Assessment & Plan Note (Signed)
PA pressure 43 March 2018

## 2016-11-23 NOTE — Assessment & Plan Note (Signed)
In the setting of coagulopathy, possibly from OP antibiotics

## 2016-11-23 NOTE — Assessment & Plan Note (Signed)
His most recent EF by echo 11/05/16 is normal- EF 55% with mod-severe TR and LAE

## 2016-11-23 NOTE — Progress Notes (Signed)
.  lk

## 2016-11-24 DIAGNOSIS — I872 Venous insufficiency (chronic) (peripheral): Secondary | ICD-10-CM | POA: Diagnosis not present

## 2016-11-24 DIAGNOSIS — N179 Acute kidney failure, unspecified: Secondary | ICD-10-CM | POA: Diagnosis not present

## 2016-11-24 DIAGNOSIS — I4891 Unspecified atrial fibrillation: Secondary | ICD-10-CM | POA: Diagnosis not present

## 2016-11-24 DIAGNOSIS — Z4800 Encounter for change or removal of nonsurgical wound dressing: Secondary | ICD-10-CM | POA: Diagnosis not present

## 2016-11-24 DIAGNOSIS — I420 Dilated cardiomyopathy: Secondary | ICD-10-CM | POA: Diagnosis not present

## 2016-11-24 DIAGNOSIS — I5033 Acute on chronic diastolic (congestive) heart failure: Secondary | ICD-10-CM | POA: Diagnosis not present

## 2016-11-24 DIAGNOSIS — Z7901 Long term (current) use of anticoagulants: Secondary | ICD-10-CM | POA: Diagnosis not present

## 2016-11-24 DIAGNOSIS — N183 Chronic kidney disease, stage 3 (moderate): Secondary | ICD-10-CM | POA: Diagnosis not present

## 2016-11-24 DIAGNOSIS — L97819 Non-pressure chronic ulcer of other part of right lower leg with unspecified severity: Secondary | ICD-10-CM | POA: Diagnosis not present

## 2016-11-24 DIAGNOSIS — G4733 Obstructive sleep apnea (adult) (pediatric): Secondary | ICD-10-CM | POA: Diagnosis not present

## 2016-11-24 DIAGNOSIS — E669 Obesity, unspecified: Secondary | ICD-10-CM | POA: Diagnosis not present

## 2016-11-24 DIAGNOSIS — N400 Enlarged prostate without lower urinary tract symptoms: Secondary | ICD-10-CM | POA: Diagnosis not present

## 2016-11-24 DIAGNOSIS — Z6834 Body mass index (BMI) 34.0-34.9, adult: Secondary | ICD-10-CM | POA: Diagnosis not present

## 2016-11-25 ENCOUNTER — Encounter: Payer: Self-pay | Admitting: Cardiovascular Disease

## 2016-11-25 DIAGNOSIS — R7301 Impaired fasting glucose: Secondary | ICD-10-CM | POA: Diagnosis not present

## 2016-11-25 DIAGNOSIS — I10 Essential (primary) hypertension: Secondary | ICD-10-CM | POA: Diagnosis not present

## 2016-11-25 DIAGNOSIS — I5033 Acute on chronic diastolic (congestive) heart failure: Secondary | ICD-10-CM | POA: Diagnosis not present

## 2016-11-25 DIAGNOSIS — I509 Heart failure, unspecified: Secondary | ICD-10-CM | POA: Diagnosis not present

## 2016-11-25 DIAGNOSIS — Z7901 Long term (current) use of anticoagulants: Secondary | ICD-10-CM | POA: Diagnosis not present

## 2016-11-25 DIAGNOSIS — Z6834 Body mass index (BMI) 34.0-34.9, adult: Secondary | ICD-10-CM | POA: Diagnosis not present

## 2016-11-25 DIAGNOSIS — L84 Corns and callosities: Secondary | ICD-10-CM | POA: Diagnosis not present

## 2016-11-25 DIAGNOSIS — D500 Iron deficiency anemia secondary to blood loss (chronic): Secondary | ICD-10-CM | POA: Diagnosis not present

## 2016-11-25 DIAGNOSIS — D62 Acute posthemorrhagic anemia: Secondary | ICD-10-CM | POA: Diagnosis not present

## 2016-11-25 DIAGNOSIS — I480 Paroxysmal atrial fibrillation: Secondary | ICD-10-CM | POA: Diagnosis not present

## 2016-11-25 DIAGNOSIS — N183 Chronic kidney disease, stage 3 (moderate): Secondary | ICD-10-CM | POA: Diagnosis not present

## 2016-11-30 DIAGNOSIS — N183 Chronic kidney disease, stage 3 (moderate): Secondary | ICD-10-CM | POA: Diagnosis not present

## 2016-11-30 DIAGNOSIS — L97819 Non-pressure chronic ulcer of other part of right lower leg with unspecified severity: Secondary | ICD-10-CM | POA: Diagnosis not present

## 2016-11-30 DIAGNOSIS — I4891 Unspecified atrial fibrillation: Secondary | ICD-10-CM | POA: Diagnosis not present

## 2016-11-30 DIAGNOSIS — I872 Venous insufficiency (chronic) (peripheral): Secondary | ICD-10-CM | POA: Diagnosis not present

## 2016-11-30 DIAGNOSIS — I5033 Acute on chronic diastolic (congestive) heart failure: Secondary | ICD-10-CM | POA: Diagnosis not present

## 2016-11-30 DIAGNOSIS — N179 Acute kidney failure, unspecified: Secondary | ICD-10-CM | POA: Diagnosis not present

## 2016-12-06 ENCOUNTER — Ambulatory Visit (INDEPENDENT_AMBULATORY_CARE_PROVIDER_SITE_OTHER): Payer: Medicare Other | Admitting: Podiatry

## 2016-12-06 ENCOUNTER — Encounter: Payer: Self-pay | Admitting: Podiatry

## 2016-12-06 DIAGNOSIS — L97519 Non-pressure chronic ulcer of other part of right foot with unspecified severity: Secondary | ICD-10-CM | POA: Diagnosis not present

## 2016-12-06 DIAGNOSIS — I83015 Varicose veins of right lower extremity with ulcer other part of foot: Secondary | ICD-10-CM

## 2016-12-06 DIAGNOSIS — L97512 Non-pressure chronic ulcer of other part of right foot with fat layer exposed: Secondary | ICD-10-CM | POA: Diagnosis not present

## 2016-12-06 MED ORDER — DOXYCYCLINE HYCLATE 100 MG PO TABS: 100.0000 mg | ORAL_TABLET | Freq: Two times a day (BID) | ORAL | 0 refills | Status: DC

## 2016-12-06 NOTE — Progress Notes (Signed)
   Subjective:  Patient presents today for evaluation of an ulcer to the second digit of the right foot. Patient is a history of multiple ulcerations. Patient recently been checked for diabetes mellitus and he is negative. Patient states that his right second digit has been red and swollen for approximately one week now. He also has a painful toenail to the right great toe. Patient is being referred to vascular for varicose veins.   Objective/Physical Exam General: The patient is alert and oriented x3 in no acute distress.  Dermatology:  Wound #1 noted to the distal tuft of the second digit right foot measuring approximately [phone] cm (LxWxD).   To the noted ulceration(s), there is no eschar. There is a moderate amount of slough, fibrin, and necrotic tissue noted. Granulation tissue and wound base is red. There is a minimal amount of serosanguineous drainage noted. There is no exposed bone muscle-tendon ligament or joint. There is no malodor. Periwound integrity is intact. Skin is warm, dry and supple bilateral lower extremities.  Vascular: Palpable pedal pulses bilaterally. Mild edema noted. Capillary refill within normal limits. Varicosities noted bilateral lower extremities. There is some red erythema localized from the second digit right foot  Neurological: Epicritic and protective threshold absent bilaterally.   Musculoskeletal Exam: Range of motion within normal limits to all pedal and ankle joints bilateral. Muscle strength 5/5 in all groups bilateral.   Assessment: #1 second digit ulceration right foot secondary to venous insufficiency #2 varicosities bilateral lower extremities #3 right second digit cellulitis  Plan of Care:  #1 Patient was evaluated. #2 medically necessary excisional debridement including subcutaneous tissue was performed using a tissue nipper and a chisel blade. Excisional debridement of all the necrotic nonviable tissue down to healthy bleeding viable  tissue was performed with post-debridement measurements same as pre-. #3 the wound was cleansed with normal saline. #4 collagen dressing applied directly to wound followed by dry sterile dressings. #4 today a postoperative shoe was dispensed.  #5 culture was taken of the second digit right foot ulcer  #6 prescription for doxycycline 100 mg twice a day #7 patient is to return to clinic in 2 weeks.   Edrick Kins, DPM [facility]  Dr. Edrick Kins, [address]                                        [place], [place] [address]                Office [phone]  Fax [phone]

## 2016-12-08 DIAGNOSIS — N183 Chronic kidney disease, stage 3 (moderate): Secondary | ICD-10-CM | POA: Diagnosis not present

## 2016-12-08 DIAGNOSIS — L97819 Non-pressure chronic ulcer of other part of right lower leg with unspecified severity: Secondary | ICD-10-CM | POA: Diagnosis not present

## 2016-12-08 DIAGNOSIS — N179 Acute kidney failure, unspecified: Secondary | ICD-10-CM | POA: Diagnosis not present

## 2016-12-08 DIAGNOSIS — I5033 Acute on chronic diastolic (congestive) heart failure: Secondary | ICD-10-CM | POA: Diagnosis not present

## 2016-12-08 DIAGNOSIS — I872 Venous insufficiency (chronic) (peripheral): Secondary | ICD-10-CM | POA: Diagnosis not present

## 2016-12-08 DIAGNOSIS — I4891 Unspecified atrial fibrillation: Secondary | ICD-10-CM | POA: Diagnosis not present

## 2016-12-09 LAB — WOUND CULTURE
Gram Stain: NONE SEEN
Gram Stain: NONE SEEN
Gram Stain: NONE SEEN
Organism ID, Bacteria: NO GROWTH

## 2016-12-10 DIAGNOSIS — I480 Paroxysmal atrial fibrillation: Secondary | ICD-10-CM | POA: Diagnosis not present

## 2016-12-10 DIAGNOSIS — N401 Enlarged prostate with lower urinary tract symptoms: Secondary | ICD-10-CM | POA: Diagnosis not present

## 2016-12-10 DIAGNOSIS — E875 Hyperkalemia: Secondary | ICD-10-CM | POA: Diagnosis not present

## 2016-12-10 DIAGNOSIS — B9562 Methicillin resistant Staphylococcus aureus infection as the cause of diseases classified elsewhere: Secondary | ICD-10-CM | POA: Diagnosis not present

## 2016-12-10 DIAGNOSIS — I5033 Acute on chronic diastolic (congestive) heart failure: Secondary | ICD-10-CM | POA: Diagnosis not present

## 2016-12-10 DIAGNOSIS — G4733 Obstructive sleep apnea (adult) (pediatric): Secondary | ICD-10-CM | POA: Diagnosis not present

## 2016-12-10 DIAGNOSIS — R7989 Other specified abnormal findings of blood chemistry: Secondary | ICD-10-CM | POA: Diagnosis not present

## 2016-12-10 DIAGNOSIS — D62 Acute posthemorrhagic anemia: Secondary | ICD-10-CM | POA: Diagnosis not present

## 2016-12-10 DIAGNOSIS — Z6834 Body mass index (BMI) 34.0-34.9, adult: Secondary | ICD-10-CM | POA: Diagnosis not present

## 2016-12-10 DIAGNOSIS — N183 Chronic kidney disease, stage 3 (moderate): Secondary | ICD-10-CM | POA: Diagnosis not present

## 2016-12-10 DIAGNOSIS — F329 Major depressive disorder, single episode, unspecified: Secondary | ICD-10-CM | POA: Diagnosis not present

## 2016-12-10 DIAGNOSIS — I87319 Chronic venous hypertension (idiopathic) with ulcer of unspecified lower extremity: Secondary | ICD-10-CM | POA: Diagnosis not present

## 2016-12-16 DIAGNOSIS — N183 Chronic kidney disease, stage 3 (moderate): Secondary | ICD-10-CM | POA: Diagnosis not present

## 2016-12-16 DIAGNOSIS — I83203 Varicose veins of unspecified lower extremity with both ulcer of ankle and inflammation: Secondary | ICD-10-CM | POA: Diagnosis not present

## 2016-12-16 DIAGNOSIS — I4891 Unspecified atrial fibrillation: Secondary | ICD-10-CM | POA: Diagnosis not present

## 2016-12-16 DIAGNOSIS — N179 Acute kidney failure, unspecified: Secondary | ICD-10-CM | POA: Diagnosis not present

## 2016-12-16 DIAGNOSIS — I83893 Varicose veins of bilateral lower extremities with other complications: Secondary | ICD-10-CM | POA: Diagnosis not present

## 2016-12-16 DIAGNOSIS — L97819 Non-pressure chronic ulcer of other part of right lower leg with unspecified severity: Secondary | ICD-10-CM | POA: Diagnosis not present

## 2016-12-16 DIAGNOSIS — I8311 Varicose veins of right lower extremity with inflammation: Secondary | ICD-10-CM | POA: Diagnosis not present

## 2016-12-16 DIAGNOSIS — I872 Venous insufficiency (chronic) (peripheral): Secondary | ICD-10-CM | POA: Diagnosis not present

## 2016-12-16 DIAGNOSIS — I8312 Varicose veins of left lower extremity with inflammation: Secondary | ICD-10-CM | POA: Diagnosis not present

## 2016-12-16 DIAGNOSIS — I5033 Acute on chronic diastolic (congestive) heart failure: Secondary | ICD-10-CM | POA: Diagnosis not present

## 2016-12-22 ENCOUNTER — Ambulatory Visit: Payer: Medicare Other | Admitting: Podiatry

## 2016-12-31 DIAGNOSIS — I8311 Varicose veins of right lower extremity with inflammation: Secondary | ICD-10-CM | POA: Diagnosis not present

## 2016-12-31 DIAGNOSIS — I83891 Varicose veins of right lower extremities with other complications: Secondary | ICD-10-CM | POA: Diagnosis not present

## 2017-01-05 DIAGNOSIS — I8311 Varicose veins of right lower extremity with inflammation: Secondary | ICD-10-CM | POA: Diagnosis not present

## 2017-01-05 DIAGNOSIS — I83891 Varicose veins of right lower extremities with other complications: Secondary | ICD-10-CM | POA: Diagnosis not present

## 2017-01-06 DIAGNOSIS — I8311 Varicose veins of right lower extremity with inflammation: Secondary | ICD-10-CM | POA: Diagnosis not present

## 2017-01-06 DIAGNOSIS — I83202 Varicose veins of unspecified lower extremity with both ulcer of calf and inflammation: Secondary | ICD-10-CM | POA: Diagnosis not present

## 2017-01-26 DIAGNOSIS — I83203 Varicose veins of unspecified lower extremity with both ulcer of ankle and inflammation: Secondary | ICD-10-CM | POA: Diagnosis not present

## 2017-01-26 DIAGNOSIS — I83891 Varicose veins of right lower extremities with other complications: Secondary | ICD-10-CM | POA: Diagnosis not present

## 2017-01-26 DIAGNOSIS — L97211 Non-pressure chronic ulcer of right calf limited to breakdown of skin: Secondary | ICD-10-CM | POA: Diagnosis not present

## 2017-01-26 DIAGNOSIS — I8311 Varicose veins of right lower extremity with inflammation: Secondary | ICD-10-CM | POA: Diagnosis not present

## 2017-01-28 ENCOUNTER — Telehealth: Payer: Self-pay | Admitting: *Deleted

## 2017-01-28 NOTE — Telephone Encounter (Addendum)
Pt's wife, Freda Munro states they just returned from the beach and the toe Dr. Amalia Hailey has been working on, gets painful when it get black on it and pt needs an appt for Monday of next week. I spoke with Freda Munro, encouraged her to take pt to the ED if symptoms of redness, swelling or fever and she states he does want an appt for 01/31/2017. I transferred Freda Munro to schedulers. 02/01/2017-A. Tamala Julian - Receptionist states pt's wife says toe is more red and swollen with fever. I told A. Smith to get pt an appt as soon as possible with Dr. Amalia Hailey.

## 2017-02-01 ENCOUNTER — Telehealth: Payer: Self-pay | Admitting: Cardiovascular Disease

## 2017-02-01 DIAGNOSIS — I5033 Acute on chronic diastolic (congestive) heart failure: Secondary | ICD-10-CM | POA: Diagnosis not present

## 2017-02-01 DIAGNOSIS — L03113 Cellulitis of right upper limb: Secondary | ICD-10-CM | POA: Diagnosis not present

## 2017-02-01 DIAGNOSIS — Z6835 Body mass index (BMI) 35.0-35.9, adult: Secondary | ICD-10-CM | POA: Diagnosis not present

## 2017-02-01 DIAGNOSIS — R0602 Shortness of breath: Secondary | ICD-10-CM | POA: Diagnosis not present

## 2017-02-01 DIAGNOSIS — L02413 Cutaneous abscess of right upper limb: Secondary | ICD-10-CM | POA: Diagnosis not present

## 2017-02-01 NOTE — Telephone Encounter (Signed)
Patient wife calling, would like to know if patient needs to bring CPAP machine to appt  With Dr. Claiborne Billings  on Thursday.

## 2017-02-01 NOTE — Telephone Encounter (Signed)
Returned call to patient's wife.Left message on personal voice mail husband is scheduled for 1 year follow up with [facility] 02/03/17 at 2:20 pm.He is not scheduled for a sleep appointment and does not need to bring cpap machine.I will send message to Mariann Laster to review.

## 2017-02-02 ENCOUNTER — Ambulatory Visit (INDEPENDENT_AMBULATORY_CARE_PROVIDER_SITE_OTHER): Payer: Medicare Other | Admitting: Podiatry

## 2017-02-02 DIAGNOSIS — L84 Corns and callosities: Secondary | ICD-10-CM

## 2017-02-03 ENCOUNTER — Ambulatory Visit (INDEPENDENT_AMBULATORY_CARE_PROVIDER_SITE_OTHER): Payer: Medicare Other | Admitting: Cardiovascular Disease

## 2017-02-03 ENCOUNTER — Encounter: Payer: Self-pay | Admitting: Cardiovascular Disease

## 2017-02-03 VITALS — BP 132/70 | HR 60 | Ht 67.0 in | Wt 223.0 lb

## 2017-02-03 DIAGNOSIS — G4719 Other hypersomnia: Secondary | ICD-10-CM | POA: Diagnosis not present

## 2017-02-03 DIAGNOSIS — I2721 Secondary pulmonary arterial hypertension: Secondary | ICD-10-CM

## 2017-02-03 DIAGNOSIS — G2581 Restless legs syndrome: Secondary | ICD-10-CM | POA: Diagnosis not present

## 2017-02-03 DIAGNOSIS — G4733 Obstructive sleep apnea (adult) (pediatric): Secondary | ICD-10-CM

## 2017-02-03 DIAGNOSIS — I482 Chronic atrial fibrillation, unspecified: Secondary | ICD-10-CM

## 2017-02-03 DIAGNOSIS — Z950 Presence of cardiac pacemaker: Secondary | ICD-10-CM

## 2017-02-03 NOTE — Patient Instructions (Addendum)
Your physician wants you to follow-up in: 1 year or sooner if needed for sleep. You will receive a reminder letter in the mail two months in advance. If you don't receive a letter, please call our office to schedule the follow-up appointment.  A order has been sent to [place] to get a download from your CPAP machine.

## 2017-02-03 NOTE — Progress Notes (Signed)
Patient ID: [name], male   DOB: Apr 20, 1932, 83 y.o.   MRN: [id number]    Primary cardiologist: Dr. Recardo Evangelist Primary M.D.: Dr. Rosana Hoes  HPI: [name] is a 83 y.o. male who is followed by Dr.Croitoru for his cardiology care.  He now presents for a one year evaluation of his obstructive sleep apnea.  Mr. Wong has history of long-standing history of permanent atrial fibrillation with a slow ventricular response for which he received a single-chamber pacemaker.  he has a history of hypertension, nonischemic cardiomyopathy with mildly depressed left ventricular systolic function, obesity, gout, and BPH.   In July 2010 he underwent a sleep study and was found to have severe obstructive sleep apnea with an AHI of 59 per hour and an RDI of 71.6 per hour.  He was initially titrated up to 16 cm water pressure on a CPAP study.   [facility] is his DME after [employer] is no longer in business.  He has a RemStar Pro CPAP unit.  He admits to 100% compliance.    He has a Mirage FX nasal mask   He also has restless legs and has been on requip 3 mg daily.   Since I last saw him one year ago, he continues to have 100% compliance.  He typically goes to bed at 2 AM and wakes up between 9 and 10 in the morning.  He is unaware of breakthrough snoring.  He does have daytime sleepiness.  An Epworth Sleepiness Scale score was calculated in the office today.  As shown below and this endorsed at 18 which is significant for hypersomnolence.  He has not had a recent download.   Epworth Sleepiness Scale: Situation   Chance of Dozing/Sleeping (0 = never , 1 = slight chance , 2 = moderate chance , 3 = high chance )   sitting and reading 3   watching TV 3   sitting inactive in a public place 3   being a passenger in a motor vehicle for an hour or more 3   lying down in the afternoon 2   sitting and talking to someone 0   sitting quietly after lunch (no alcohol) 2   while stopped for a few minutes in traffic as  the driver 2   Total Score  18   Since I last saw him, he has undergone initial laser surgery to his right leg at [facility].  Past Medical History:  Diagnosis Date  . Coronary artery disease   . Diverticulitis   . Gout   . Hyperlipidemia   . MI (myocardial infarction) ([place])   . Nonischemic cardiomyopathy (HCC)    mild  . OSA on CPAP   . Permanent atrial fibrillation ([place])   . Pulmonary hypertension ([place])     Past Surgical History:  Procedure Laterality Date  . CARDIAC CATHETERIZATION  11/07/2008   nonischemic cardiomyopathy,pulmonary hypertension  . COLOSTOMY    . COLOSTOMY REVERSAL    . CORONARY ANGIOPLASTY  06/01/1999   successful to ostium of the first diagonal  . ESOPHAGOGASTRODUODENOSCOPY N/A 08/22/2013   Procedure: ESOPHAGOGASTRODUODENOSCOPY (EGD);  Surgeon: [name], MD;  Location: [facility] ENDOSCOPY;  Service: Endoscopy;  Laterality: N/A;  h/p in file cabinet, jackie  . ESOPHAGOGASTRODUODENOSCOPY N/A 11/05/2014   Procedure: ESOPHAGOGASTRODUODENOSCOPY (EGD);  Surgeon: Clarene Essex, MD;  Location: [facility] ENDOSCOPY;  Service: Endoscopy;  Laterality: N/A;  . ESOPHAGOGASTRODUODENOSCOPY N/A 11/08/2016   Procedure: ESOPHAGOGASTRODUODENOSCOPY (EGD);  Surgeon: [name], MD;  Location: [facility] ENDOSCOPY;  Service: Endoscopy;  Laterality: N/A;  . HOT HEMOSTASIS N/A 11/05/2014   Procedure: HOT HEMOSTASIS (ARGON PLASMA COAGULATION/BICAP);  Surgeon: Clarene Essex, MD;  Location: [facility] ENDOSCOPY;  Service: Endoscopy;  Laterality: N/A;  . NM MYOCAR PERF WALL MOTION  11/24/2007   normal  . PERMANENT PACEMAKER INSERTION  10/04/2012   [employer]  . PERMANENT PACEMAKER INSERTION N/A 10/12/2011   Procedure: PERMANENT PACEMAKER INSERTION;  Surgeon: [name], MD;  Location: [place] CATH LAB;  Service: Cardiovascular;  Laterality: N/A;  . REPLACEMENT TOTAL KNEE    . SAVORY DILATION N/A 08/22/2013   Procedure: SAVORY DILATION;  Surgeon: [name], MD;  Location: [facility] ENDOSCOPY;  Service: Endoscopy;   Laterality: N/A;  . SHOULDER SURGERY    . US ECHOCARDIOGRAPHY  02/01/2011   LA is mod-severely dilated,AOV & root sclerotic,ca+ AOV leaflets    Allergies  Allergen Reactions  . Vicodin [Hydrocodone-Acetaminophen] Nausea And Vomiting  . Hydrocodone Nausea Only    Current Outpatient Prescriptions  Medication Sig Dispense Refill  . acetaminophen (TYLENOL) 650 MG CR tablet Take 650 mg by mouth every 8 (eight) hours as needed for pain.    [name] allopurinol (ZYLOPRIM) 100 MG tablet Take 2 tablets (200 mg total) by mouth daily. 30 tablet 1  . clindamycin (CLEOCIN) 300 MG capsule Take 1 capsule by mouth 4 (four) times daily. For 7 days    . COLCRYS 0.6 MG tablet Take 0.6 mg by mouth as needed (flareups).     [name] Calcium (STOOL SOFTENER PO) Take 2 tablets by mouth at bedtime.     [name] doxycycline (VIBRA-TABS) 100 MG tablet Take 1 tablet (100 mg total) by mouth 2 (two) times daily. 20 tablet 0  . dutasteride (AVODART) 0.5 MG capsule Take 0.5 mg by mouth daily.    [name] ELIQUIS 5 MG TABS tablet Take 1 tablet by mouth 2 (two) times daily.    [name] escitalopram (LEXAPRO) 5 MG tablet Take 1 tablet (5 mg total) by mouth at bedtime. 30 tablet 0  . furosemide (LASIX) 40 MG tablet Take 1 tablet (40 mg total) by mouth daily. Can use additional tablet in a day if needed for swelling or weight gain of 2-3 lbs in 24 hours 30 tablet   . hydrOXYzine (ATARAX/VISTARIL) 25 MG tablet Take 1 tablet (25 mg total) by mouth every 6 (six) hours as needed for itching. 30 tablet 0  . loratadine (CLARITIN) 10 MG tablet Take 10 mg by mouth daily.    . mirtazapine (REMERON) 15 MG tablet Take 15 mg by mouth at bedtime.    . Multiple Vitamins-Minerals (PRESERVISION AREDS 2) CAPS Take 1 capsule by mouth daily.    . nitroGLYCERIN (NITROSTAT) 0.4 MG SL tablet Place 0.4 mg under the tongue every 5 (five) minutes as needed. Chest pain    . Omega-3 Fatty Acids (OMEGA-3 PO) Take 1 capsule by mouth 2 (two) times daily.    . pantoprazole  (PROTONIX) 40 MG tablet Take 1 tablet (40 mg total) by mouth daily. 30 tablet 1  . rOPINIRole (REQUIP) 3 MG tablet Take 1 tablet by mouth daily. Take 1 tab daily    . Tamsulosin HCl (FLOMAX) 0.4 MG CAPS Take 0.4 mg by mouth daily after breakfast.    . traMADol (ULTRAM) 50 MG tablet Take 50 mg by mouth 4 (four) times daily. For pain     . warfarin (COUMADIN) 5 MG tablet Take 1 tablet (5 mg total) by mouth daily. Start taking  on 11/12/2016, you must have PT/INR     Current Facility-Administered Medications  Medication Dose Route Frequency Provider Last Rate Last Dose  . betamethasone acetate-betamethasone sodium phosphate (CELESTONE) injection 12 mg  12 mg Intramuscular Once Edrick Kins, DPM        Social History   Social History  . Marital status: Married    Spouse name: N/A  . Number of children: N/A  . Years of education: N/A   Occupational History  . Not on file.   Social History Main Topics  . Smoking status: Never Smoker  . Smokeless tobacco: Never Used  . Alcohol use No  . Drug use: No  . Sexual activity: Not on file   Other Topics Concern  . Not on file   Social History Narrative  . No narrative on file     ROS General: Negative; No fevers, chills, or night sweats HEENT: Negative; No changes in vision or hearing, sinus congestion, difficulty swallowing Pulmonary: Negative; No cough, wheezing, shortness of breath, hemoptysis Cardiovascular: Positive for atrial fibrillation and permanent pacemaker; No chest pain, presyncope, syncope, palpatations GI: Negative; No nausea, vomiting, diarrhea, or abdominal pain GU: Negative; No dysuria, hematuria, or difficulty voiding Musculoskeletal: Negative; no myalgias, joint pain, or weakness Hematologic: Negative; no easy bruising, bleeding Endocrine: Negative; no heat/cold intolerance Neuro: Negative; no changes in balance, headaches Skin: Negative; No rashes or skin lesions Psychiatric: Negative; No behavioral problems,  depression Sleep: positive for OSA and  restless legs, no bruxism,hypnogognic hallucinations, no cataplexy   Physical Exam BP 132/70 (BP Location: Right Arm, Patient Position: Sitting, Cuff Size: Normal)   Pulse 60   Ht 5' 7" (1.702 m)   Wt 223 lb (101.2 kg)   BMI 34.93 kg/m    Repeat blood pressure by me 122/74.  Wt Readings from Last 3 Encounters:  02/03/17 223 lb (101.2 kg)  11/23/16 221 lb (100.2 kg)  11/10/16 228 lb 1.6 oz (103.5 kg)   General: Alert, oriented, no distress.  Skin: normal turgor, no rashes, warm and dry HEENT: Normocephalic, atraumatic. Pupils equal round and reactive to light; sclera anicteric; extraocular muscles intact;  Nose without nasal septal hypertrophy Mouth/Parynx benign; Mallinpatti scale 3 Neck: No JVD, no carotid bruits; normal carotid upstroke Lungs: clear to ausculatation and percussion; no wheezing or rales Chest wall: without tenderness to palpitation Heart: PMI not displaced, RRR, s1 s2 normal, 2/6 systolic murmur, no diastolic murmur, no rubs, gallops, thrills, or heaves Abdomen: Mild central adiposity; soft, nontender; no hepatosplenomehaly, BS+; abdominal aorta nontender and not dilated by palpation. Back: no CVA tenderness Pulses 2+ Musculoskeletal: full range of motion, normal strength, no joint deformities Extremities: Trivial ankle edema no clubbing cyanosis, Homan's sign negative  Neurologic: grossly nonfocal; Cranial nerves grossly wnl Psychologic: Normal mood and affect; normal cognition  ECG (independently read by me): Ventricular paced rhythm at 60 bpm.  June 2017 ECG (independently read by me): Ventricular paced rhythm at 61 bpm.    LABS: BMP Latest Ref Rng & Units 11/10/2016 11/09/2016 11/08/2016  Glucose 65 - 99 mg/dL 87 103(H) 81  BUN 6 - 20 mg/dL 49(H) 67(H) 79(H)  Creatinine 0.61 - 1.24 mg/dL 1.84(H) 1.64(H) 1.94(H)  Sodium 135 - 145 mmol/L 143 140 142  Potassium 3.5 - 5.1 mmol/L 4.1 3.8 3.8  Chloride 101 - 111 mmol/L  101 100(L) 102  CO2 22 - 32 mmol/L 34(H) 33(H) 32  Calcium 8.9 - 10.3 mg/dL 8.8(L) 8.7(L) 9.0   Hepatic Function Latest Ref Rng &  Units 11/09/2016 11/08/2016 11/07/2016  Total Protein 6.5 - 8.1 g/dL 6.2(L) 6.8 6.6  Albumin 3.5 - 5.0 g/dL 2.8(L) 3.0(L) 3.0(L)  AST 15 - 41 U/L 142(H) 197(H) 238(H)  ALT 17 - 63 U/L 129(H) 158(H) 175(H)  Alk Phosphatase 38 - 126 U/L 46 49 50  Total Bilirubin 0.3 - 1.2 mg/dL 1.5(H) 2.3(H) 1.9(H)  Bilirubin, Direct 0.1 - 0.5 mg/dL - - -    CBC Latest Ref Rng & Units 11/10/2016 11/09/2016 11/08/2016  WBC 4.0 - 10.5 K/uL 4.1 4.6 4.7  Hemoglobin 13.0 - 17.0 g/dL 8.6(L) 7.7(L) 8.2(L)  Hematocrit 39.0 - 52.0 % 26.4(L) 24.4(L) 25.5(L)  Platelets 150 - 400 K/uL 160 166 168   Lipid Panel     Component Value Date/Time   CHOL  11/04/2008 1857    173        ATP III CLASSIFICATION:  <200     mg/dL   Desirable  200-239  mg/dL   Borderline High  >=240    mg/dL   High          TRIG 126 11/04/2008 1857   HDL 37 (L) 11/04/2008 1857   CHOLHDL 4.7 11/04/2008 1857   VLDL 25 11/04/2008 1857   LDLCALC (H) 11/04/2008 1857    111        Total Cholesterol/HDL:CHD Risk Coronary Heart Disease Risk Table                     Men   Women  1/2 Average Risk   3.4   3.3  Average Risk       5.0   4.4  2 X Average Risk   9.6   7.1  3 X Average Risk  23.4   11.0        Use the calculated Patient Ratio above and the CHD Risk Table to determine the patient's CHD Risk.        ATP III CLASSIFICATION (LDL):  <100     mg/dL   Optimal  100-129  mg/dL   Near or Above                    Optimal  130-159  mg/dL   Borderline  160-189  mg/dL   High  >190     mg/dL   Very High     RADIOLOGY: No results found.  IMPRESSION:  1. Obstructive sleep apnea syndrome   2. Restless leg syndrome   3. Excessive daytime sleepiness   4. Chronic atrial fibrillation (HCC)   5. PAH (pulmonary artery hypertension) ([place])   6. Pacemaker single chamber, [employer], 2013      ASSESSMENT AND PLAN: Mr. Dominie Benedick Is an 83 year old Caucasian male who has a history of permanent atrial fibrillation with a slow ventricular response and is status post permanent pacemaker.  He has severe obstructive sleep apnea by his initial diagnostic polysomnogram in July 2010.  He has been on CPAP therapy since the fall of 2010 with his initial download in  demonstrating compliance.  He continues to have his old Respironics C-Flex REMstar Pro unit.  He is sleeping well.  He is sleeping approximately 7-8 hours per night.  He has restless leg syndrome which is controlled with requip. He uses a nasal mask and is at a 16 cm water pressure.  He believes he is sleeping well.  However, his Epworth Sleepiness Scale today endorsed at 18 which is suggestive of significant excessive daytime sleepiness.  We will need to obtain a download from [place] and make certain he does not require significant adjustment to his CPAP treatment.  His machine is old and he can qualify for new machine.  His ECG today is stable and he remains  ventricular paced.  He was hospitalized on 11/03/2016 with acute diastolic heart failure from self-induced overhydration.  During that admission there was acute renal insufficiency.  Losartan was discontinued.  His blood pressure today is stable.  He has mild to moderate coronary hypertension with PA pressure at 43 mmHg in March 2018.  He has undergone some initial treatment for right leg varicosities with laser surgery and will need additional treatment.  He is no longer on warfarin and is now on eloquence for anticoagulation.  I will contact him once I review his download and adjustments to his CPAP will be made if necessary.  If he does get a new machine, I will need to see him within 90 days following initiation per Medicare guidelines requirements.    Troy Sine, MD, [facility]  02/05/2017 11:48 AM

## 2017-02-04 NOTE — Progress Notes (Signed)
   Subjective: Patient presents to the office today for chief complaint of painful callus lesions of the right second toe and underneath the right fourth digit. Patient states that the pain is ongoing and is affecting their ability to ambulate without pain. Patient presents today for further treatment and evaluation.  Objective:  Physical Exam General: Alert and oriented x3 in no acute distress  Dermatology: Hyperkeratotic lesion present on the second toe of the right foot. Pain on palpation with a central nucleated core noted.  Skin is warm, dry and supple bilateral lower extremities. Negative for open lesions or macerations.  Vascular: Palpable pedal pulses bilaterally. No edema or erythema noted. Capillary refill within normal limits.  Neurological: Epicritic and protective threshold grossly intact bilaterally.   Musculoskeletal Exam: Pain on palpation at the keratotic lesion noted. Range of motion within normal limits bilateral. Muscle strength 5/5 in all groups bilateral.  Assessment: #1 porokeratosis second toe right foot   Plan of Care:  #1 Patient evaluated #2 Excisional debridement of keratoic lesion using a chisel blade was performed without incident.  #3 Treated area(s) with Salinocaine and dressed with light dressing. #4 recommend antibiotic ointment and Band-Aid daily. #5 Patient is to return to the clinic in 8 weeks.   Edrick Kins, DPM [facility]  Dr. Edrick Kins, [place]                                        [place], [place] [address]                Office [phone]  Fax [phone]

## 2017-02-08 ENCOUNTER — Telehealth: Payer: Self-pay | Admitting: *Deleted

## 2017-02-08 NOTE — Telephone Encounter (Signed)
Faxed signed paper order for the respiratory department to get a CPAP download.

## 2017-02-24 ENCOUNTER — Ambulatory Visit: Payer: Medicare Other | Admitting: Cardiology

## 2017-03-02 DIAGNOSIS — I8312 Varicose veins of left lower extremity with inflammation: Secondary | ICD-10-CM | POA: Diagnosis not present

## 2017-03-02 DIAGNOSIS — I83892 Varicose veins of left lower extremities with other complications: Secondary | ICD-10-CM | POA: Diagnosis not present

## 2017-03-04 ENCOUNTER — Telehealth: Payer: Self-pay | Admitting: Cardiovascular Disease

## 2017-03-04 NOTE — Telephone Encounter (Signed)
Patient daughter calling, states that patient is in fluid overload. She needs some advice.

## 2017-03-04 NOTE — Telephone Encounter (Signed)
Spoke to DOD, increase lasix to 40 BID and see APP first of the week.  Daughter aware and verbalized understanding.    No available appts at this time, will follow up on Monday to have scheduler assist.    Daughter aware.

## 2017-03-04 NOTE — Telephone Encounter (Signed)
Returned call to daughter (per DPR) reports patient is experiencing LE swelling and SOB with exertion.  Reports his legs are "tight as a tick" and has periorbital edema.  [place] at the [place] he walked to the car and became very SOB.  "somewhat" short of breath at rest.   Denies abdominal swelling.  [place] the beach [place] 3 [place], not limiting salt intake or fluid intake and "came back looking like this".   Weight currently 232 lbs (last OV 221 lbs).  Patient has been taking 40mg  lasix daily, has not taken extra as needed (prescribed), daughter states she doesn't think he understands when he needs it.   Advised to take extra dose of lasix as instructed, and will speak to DOD for further instruction.

## 2017-03-07 ENCOUNTER — Encounter: Payer: Medicare Other | Admitting: Cardiovascular Disease

## 2017-03-07 NOTE — Telephone Encounter (Signed)
Patient has appt today with Dr. [name] at 2:20.

## 2017-03-09 ENCOUNTER — Encounter: Payer: Self-pay | Admitting: Cardiovascular Disease

## 2017-03-09 ENCOUNTER — Ambulatory Visit (INDEPENDENT_AMBULATORY_CARE_PROVIDER_SITE_OTHER): Payer: Medicare Other | Admitting: Cardiovascular Disease

## 2017-03-09 VITALS — BP 118/64 | HR 60 | Ht 67.0 in | Wt 223.6 lb

## 2017-03-09 DIAGNOSIS — I481 Persistent atrial fibrillation: Secondary | ICD-10-CM | POA: Diagnosis not present

## 2017-03-09 DIAGNOSIS — G4733 Obstructive sleep apnea (adult) (pediatric): Secondary | ICD-10-CM

## 2017-03-09 DIAGNOSIS — Z950 Presence of cardiac pacemaker: Secondary | ICD-10-CM

## 2017-03-09 DIAGNOSIS — I5032 Chronic diastolic (congestive) heart failure: Secondary | ICD-10-CM | POA: Diagnosis not present

## 2017-03-09 DIAGNOSIS — Z79899 Other long term (current) drug therapy: Secondary | ICD-10-CM | POA: Diagnosis not present

## 2017-03-09 DIAGNOSIS — Z7901 Long term (current) use of anticoagulants: Secondary | ICD-10-CM

## 2017-03-09 DIAGNOSIS — I2721 Secondary pulmonary arterial hypertension: Secondary | ICD-10-CM | POA: Diagnosis not present

## 2017-03-09 DIAGNOSIS — I4819 Other persistent atrial fibrillation: Secondary | ICD-10-CM

## 2017-03-09 DIAGNOSIS — I482 Chronic atrial fibrillation: Secondary | ICD-10-CM | POA: Diagnosis not present

## 2017-03-09 DIAGNOSIS — I5033 Acute on chronic diastolic (congestive) heart failure: Secondary | ICD-10-CM | POA: Diagnosis not present

## 2017-03-09 MED ORDER — HYDROXYZINE HCL 25 MG PO TABS: 25.0000 mg | ORAL_TABLET | Freq: Four times a day (QID) | ORAL | 0 refills | Status: DC | PRN

## 2017-03-09 MED ORDER — FUROSEMIDE 80 MG PO TABS: 80.0000 mg | ORAL_TABLET | Freq: Every day | ORAL | 3 refills | Status: DC

## 2017-03-09 MED ORDER — FUROSEMIDE 40 MG PO TABS: 40.0000 mg | ORAL_TABLET | Freq: Two times a day (BID) | ORAL | 3 refills | Status: DC

## 2017-03-09 NOTE — Patient Instructions (Signed)
Dr Croitoru recommends that you continue on your current medications as directed. Please refer to the Current Medication list given to you today.  Your physician recommends that you return for lab work TODAY.  Dr Croitoru recommends that you schedule a follow-up appointment in 3 months with a pacemaker check.  If you need a refill on your cardiac medications before your next appointment, please call your pharmacy. 

## 2017-03-09 NOTE — Progress Notes (Signed)
Patient ID: [name], male   DOB: April 10, 1932, 83 y.o.   MRN: [id number]    Cardiology Office Note    Date:  03/09/2017   ID:  [name], DOB 12-Jan-1932, MRN [id number]  PCP:  [name], MD  Cardiologist:  Shelva Majestic, M.D.; [name], MD   Chief Complaint  Patient presents with  . Follow-up    3 months;    History of Present Illness:  [name] is a 83 y.o. male with long-standing permanent atrial fibrillation with slow ventricular response, single chamber pacemaker, obstructive sleep apnea, systemic hypertension, nonischemic cardiomyopathy with mildly depressed left ventricular systolic function, obesity, gout and BPH.  He was hospitalized in March with acute exacerbation of heart failure and Septra-related acute renal insufficiency and hyperkalemia and discontinuation of ARB. Also had severely elevated prothrombin time. He was switched to Eliquis During that hospitalization his echo showed EF of 50-55%, so showed moderate to severe tricuspid regurgitation and an estimated systolic PA pressure of 43 mmHg. Left atrium severely dilated at 51 mm. Most recent labs [facility] in April 19 show a creatinine of 1.0, potassium 4.1, hemoglobin of 10.6  Last Dr. Claiborne Billings about a month ago for sleep apnea, Epworth sleepiness scale was still elevated at 18. Trying to get him a newer model CPAP machine.   Over the last couple weeks has developed worsening lower extremity edema, but does not have orthopnea or PND. Despite compression stockings during the day, he still has swelling halfway up his shins when he wakes up in the morning. Increased his furosemide to 80 mg a day over the weekend, some improvement in swelling. He has not had recent problems with dizziness or syncope. He still has excessive daytime hypersomnolence. He does use usually weigh himself on a daily basis, but did not do so this morning.  He has not required nitroglycerin has not had chest pain in a very  long time.  Interrogation of his pacemaker [employer] and planted in 2015) shows normal device function with excellent lead parameters and an overall prevalence of ventricular pacing of 72%, despite the fact that he does not take any AV blocking agents. High ventricular rates are rare and very brief and most likely represent atrial fibrillation with RVR, rather than ventricular tachycardia. Today his underlying rhythm is atrial fibrillation slow ventricular response at about 50 bpm. Estimated generator longevity is 5.5 years  In 2010 he had normal coronary arteries at angiography and LV EF by LV angina was 40-45%, in 2012 echo showed LV EF of 50-55%. In 2015 EF was 55-60% by echo. In March 2018, EF 50-55 %.   Past Medical History:  Diagnosis Date  . Coronary artery disease   . Diverticulitis   . Gout   . Hyperlipidemia   . MI (myocardial infarction) ([place])   . Nonischemic cardiomyopathy ([facility])    mild  . OSA on CPAP   . Permanent atrial fibrillation ([place])   . Pulmonary hypertension ([place])     Past Surgical History:  Procedure Laterality Date  . CARDIAC CATHETERIZATION  11/07/2008   nonischemic cardiomyopathy,pulmonary hypertension  . COLOSTOMY    . COLOSTOMY REVERSAL    . CORONARY ANGIOPLASTY  06/01/1999   successful to ostium of the first diagonal  . ESOPHAGOGASTRODUODENOSCOPY N/A 08/22/2013   Procedure: ESOPHAGOGASTRODUODENOSCOPY (EGD);  Surgeon: [name], MD;  Location: [facility] ENDOSCOPY;  Service: Endoscopy;  Laterality: N/A;  h/p in file cabinet, jackie  . ESOPHAGOGASTRODUODENOSCOPY N/A 11/05/2014  Procedure: ESOPHAGOGASTRODUODENOSCOPY (EGD);  Surgeon: Clarene Essex, MD;  Location: [facility] ENDOSCOPY;  Service: Endoscopy;  Laterality: N/A;  . ESOPHAGOGASTRODUODENOSCOPY N/A 11/08/2016   Procedure: ESOPHAGOGASTRODUODENOSCOPY (EGD);  Surgeon: [name], MD;  Location[facility] ENDOSCOPY;  Service: Endoscopy;  Laterality: N/A;  . HOT HEMOSTASIS N/A 11/05/2014   Procedure: HOT  HEMOSTASIS (ARGON PLASMA COAGULATION/BICAP);  Surgeon: Clarene Essex, MD;  Location: [facility] ENDOSCOPY;  Service: Endoscopy;  Laterality: N/A;  . NM MYOCAR PERF WALL MOTION  11/24/2007   normal  . PERMANENT PACEMAKER INSERTION  10/04/2012   [employer]  . PERMANENT PACEMAKER INSERTION N/A 10/12/2011   Procedure: PERMANENT PACEMAKER INSERTION;  Surgeon: [name], MD;  Location: [place] CATH LAB;  Service: Cardiovascular;  Laterality: N/A;  . REPLACEMENT TOTAL KNEE    . SAVORY DILATION N/A 08/22/2013   Procedure: SAVORY DILATION;  Surgeon: [name], MD;  Location: [facility] ENDOSCOPY;  Service: Endoscopy;  Laterality: N/A;  . SHOULDER SURGERY    . US ECHOCARDIOGRAPHY  02/01/2011   LA is mod-severely dilated,AOV & root sclerotic,ca+ AOV leaflets    Outpatient Medications Prior to Visit  Medication Sig Dispense Refill  . acetaminophen (TYLENOL) 650 MG CR tablet Take 650 mg by mouth every 8 (eight) hours as needed for pain.    [name] allopurinol (ZYLOPRIM) 100 MG tablet Take 2 tablets (200 mg total) by mouth daily. 30 tablet 1  . COLCRYS 0.6 MG tablet Take 0.6 mg by mouth as needed (flareups).     [name] Calcium (STOOL SOFTENER PO) Take 2 tablets by mouth at bedtime.     . dutasteride (AVODART) 0.5 MG capsule Take 0.5 mg by mouth daily.    [name] ELIQUIS 5 MG TABS tablet Take 1 tablet by mouth 2 (two) times daily.    [name] escitalopram (LEXAPRO) 5 MG tablet Take 1 tablet (5 mg total) by mouth at bedtime. 30 tablet 0  . loratadine (CLARITIN) 10 MG tablet Take 10 mg by mouth daily.    . mirtazapine (REMERON) 15 MG tablet Take 15 mg by mouth at bedtime.    . Multiple Vitamins-Minerals (PRESERVISION AREDS 2) CAPS Take 1 capsule by mouth daily.    . nitroGLYCERIN (NITROSTAT) 0.4 MG SL tablet Place 0.4 mg under the tongue every 5 (five) minutes as needed. Chest pain    . Omega-3 Fatty Acids (OMEGA-3 PO) Take 1 capsule by mouth 2 (two) times daily.    . pantoprazole (PROTONIX) 40 MG tablet Take 1 tablet (40 mg total)  by mouth daily. 30 tablet 1  . rOPINIRole (REQUIP) 3 MG tablet Take 1 tablet by mouth daily. Take 1 tab daily    . Tamsulosin HCl (FLOMAX) 0.4 MG CAPS Take 0.4 mg by mouth daily after breakfast.    . traMADol (ULTRAM) 50 MG tablet Take 50 mg by mouth 4 (four) times daily. For pain     . hydrOXYzine (ATARAX/VISTARIL) 25 MG tablet Take 1 tablet (25 mg total) by mouth every 6 (six) hours as needed for itching. 30 tablet 0  . clindamycin (CLEOCIN) 300 MG capsule Take 1 capsule by mouth 4 (four) times daily. For 7 days    . doxycycline (VIBRA-TABS) 100 MG tablet Take 1 tablet (100 mg total) by mouth 2 (two) times daily. (Patient not taking: Reported on 03/09/2017) 20 tablet 0  . furosemide (LASIX) 40 MG tablet Take 1 tablet (40 mg total) by mouth daily. Can use additional tablet in a day if needed for swelling or weight gain of 2-3 lbs  in 24 hours (Patient not taking: Reported on 03/09/2017) 30 tablet   . warfarin (COUMADIN) 5 MG tablet Take 1 tablet (5 mg total) by mouth daily. Start taking on 11/12/2016, you must have PT/INR (Patient not taking: Reported on 03/09/2017)     Facility-Administered Medications Prior to Visit  Medication Dose Route Frequency Provider Last Rate Last Dose  . betamethasone acetate-betamethasone sodium phosphate (CELESTONE) injection 12 mg  12 mg Intramuscular Once Edrick Kins, DPM         Allergies:   Vicodin [hydrocodone-acetaminophen] and Hydrocodone   Social History   Social History  . Marital status: Married    Spouse name: N/A  . Number of children: N/A  . Years of education: N/A   Social History Main Topics  . Smoking status: Never Smoker  . Smokeless tobacco: Never Used  . Alcohol use No  . Drug use: No  . Sexual activity: Not Asked   Other Topics Concern  . None   Social History Narrative  . None     Family History:  The patient's family history includes Cancer in his mother; Diabetes in his brother; Heart attack in his father.   ROS:   Please see  the history of present illness.    ROS All other systems reviewed and are negative.   PHYSICAL EXAM:   VS:  BP 118/64   Pulse 60   Ht 5\' 7"  (1.702 m)   Wt 223 lb 9.6 oz (101.4 kg)   BMI 35.02 kg/m    GEN: Well nourished, well developed, in no acute distress, obesity limits evaluation of his jugular venous pulsations and delineation of the cardiac apical impulse  General: Alert, oriented x3, no distress Head: no evidence of trauma, PERRL, EOMI, no exophtalmos or lid lag, no myxedema, no xanthelasma; normal ears, nose and oropharynx Neck: Hard to see jugular venous pulsations, but probably 5-6 centimeters elevation; brisk carotid pulses without delay and no carotid bruits Chest: clear to auscultation, no signs of consolidation by percussion or palpation, normal fremitus, symmetrical and full respiratory excursions Cardiovascular: normal position and quality of the apical impulse, regular rhythm, normal first and Paradoxically split second heart sounds, no murmurs, rubs or gallops Abdomen: no tenderness or distention, no masses by palpation, no abnormal pulsatility or arterial bruits, normal bowel sounds, no hepatosplenomegaly Extremities: no clubbing, cyanosis; has 1-2 plus pitting edema halfway up the knees bilaterally; 2+ radial, ulnar and brachial pulses bilaterally; 2+ right femoral, posterior tibial and dorsalis pedis pulses; 2+ left femoral, posterior tibial and dorsalis pedis pulses; no subclavian or femoral bruits Neurological: grossly nonfocal   Wt Readings from Last 3 Encounters:  03/09/17 223 lb 9.6 oz (101.4 kg)  02/03/17 223 lb (101.2 kg)  11/23/16 221 lb (100.2 kg)      Studies/Labs Reviewed:   EKG:  EKG is not ordered today.    Recent Labs: 11/03/2016: Magnesium 2.3; TSH 2.991 11/04/2016: B Natriuretic Peptide 1,048.9 11/09/2016: ALT 129 11/10/2016: BUN 49; Creatinine, Ser 1.84; Hemoglobin 8.6; Platelets 160; Potassium 4.1; Sodium 143   11/25/2016, [facility] Creatinine 1.0, potassium 4.1, hemoglobin 10.6    ASSESSMENT:    1. Atrial fibrillation, persistent, slow VR   2. Acute on chronic diastolic (congestive) heart failure ([place])   3. Pacemaker single chamber, [employer], 2013   4. Chronic anticoagulation   5. PAH (pulmonary artery hypertension) ([place])   6. Obstructive sleep apnea syndrome   7. Medication management      PLAN:  In order of problems listed above:  1. AFib: Spontaneous low ventricular rate consistent with AV node disease. Requires a relatively high frequency of ventricular pacing at 72%, but now has near-normal EF. Avoid negative chronotropic agents. CHADSVasc at least 3 (age 73, CHF), high embolic risk on appropriate anticoagulation. 2. CHF: Exacerbation in April related to acute renal insufficiency. Has developed some worsening edema and has increase his diuretic dose. Will recheck labs today and adjust medications accordingly. Clinically he seems to be about 5-6 pounds above ideal weight and it seems that he has lost some true weight over the last year. Repeated instructions to be careful with sodium restriction in his diet and continue daily weight monitoring. Rarely has findings of right heart failure, related to moderate to severe tricuspid regurgitation. Intern this is likely related to pulmonary hypertension from obstructive sleep apnea, but also the presence of a pacemaker lead across the valve and probably some degree of diastolic left heart failure. 3. PPM normally functioning single chamber pacemaker arrhythmia. Office visit in 3 months 4. Eliquis: complains of easy bruising, but no serious bleeding problems. 5. PAH: The most likely cause of pulmonary hypertension is obesity and insufficiently treated obstructive sleep apnea. Probably some degree of diastolic left heart failure. 6. OSA:  He clearly has daytime hypersomnolence and fatigue. May need adjustment in device settings. Will check with Dr.  Claiborne Billings to see if he is received a remote download yet.     Medication Adjustments/Labs and Tests Ordered: Current medicines are reviewed at length with the patient today.  Concerns regarding medicines are outlined above.  Medication changes, Labs and Tests ordered today are listed in the Patient Instructions below. Patient Instructions  Dr [name] recommends that you continue on your current medications as directed. Please refer to the Current Medication list given to you today.  Your physician recommends that you return for lab work TODAY.  Dr [name] recommends that you schedule a follow-up appointment in 3 months with a pacemaker check.  If you need a refill on your cardiac medications before your next appointment, please call your pharmacy.      Signed, [name], MD  03/09/2017 10:24 AM    [facility] [address], [place], [place]  [address] Phone: [phone]; Fax: [phone]

## 2017-03-10 LAB — BASIC METABOLIC PANEL
BUN / CREAT RATIO: 24 (ref 10–24)
BUN: 35 mg/dL — ABNORMAL HIGH (ref 8–27)
CALCIUM: 9.1 mg/dL (ref 8.6–10.2)
CHLORIDE: 96 mmol/L (ref 96–106)
CO2: 28 mmol/L (ref 20–29)
CREATININE: 1.44 mg/dL — AB (ref 0.76–1.27)
GFR calc non Af Amer: 45 mL/min/{1.73_m2} — ABNORMAL LOW (ref >59)
GFR, EST AFRICAN AMERICAN: 52 mL/min/{1.73_m2} — AB (ref >59)
GLUCOSE: 89 mg/dL (ref 65–99)
Potassium: 4.2 mmol/L (ref 3.5–5.2)
Sodium: 142 mmol/L (ref 134–144)

## 2017-03-10 LAB — MAGNESIUM: Magnesium: 2.2 mg/dL (ref 1.6–2.3)

## 2017-03-10 LAB — BRAIN NATRIURETIC PEPTIDE: BNP: 395.1 pg/mL — AB (ref 0.0–100.0)

## 2017-03-16 ENCOUNTER — Telehealth: Payer: Self-pay | Admitting: Cardiovascular Disease

## 2017-03-16 DIAGNOSIS — I272 Pulmonary hypertension, unspecified: Secondary | ICD-10-CM

## 2017-03-16 DIAGNOSIS — I2721 Secondary pulmonary arterial hypertension: Secondary | ICD-10-CM

## 2017-03-16 DIAGNOSIS — I5033 Acute on chronic diastolic (congestive) heart failure: Secondary | ICD-10-CM

## 2017-03-16 LAB — CUP PACEART INCLINIC DEVICE CHECK
Battery Remaining Longevity: 66 mo
Brady Statistic RV Percent Paced: 72 %
Implantable Lead Implant Date: 20130305
Implantable Lead Location: 753860
Implantable Lead Model: 4137
Implantable Lead Serial Number: 29020819
Lead Channel Pacing Threshold Amplitude: 1 V
Lead Channel Pacing Threshold Pulse Width: 0.4 ms
Lead Channel Sensing Intrinsic Amplitude: 19.3 mV
Lead Channel Setting Pacing Amplitude: 1.5 V
Lead Channel Setting Sensing Sensitivity: 2.5 mV
MDC IDC MSMT LEADCHNL RV IMPEDANCE VALUE: 624 Ohm
MDC IDC PG IMPLANT DT: 20130305
MDC IDC PG SERIAL: 113290
MDC IDC SESS DTM: 20180808133104
MDC IDC SET LEADCHNL RV PACING PULSEWIDTH: 0.4 ms

## 2017-03-16 NOTE — Telephone Encounter (Signed)
New Message     Per wife patient is doing better.  What were the results of his blood work . If no answer at home try cell please

## 2017-03-16 NOTE — Telephone Encounter (Signed)
Returned call to patient no answer at home # and cell #.Branch.

## 2017-03-17 MED ORDER — POTASSIUM CHLORIDE ER 10 MEQ PO TBCR: 10.0000 meq | EXTENDED_RELEASE_TABLET | Freq: Every day | ORAL | 3 refills | Status: DC

## 2017-03-17 NOTE — Telephone Encounter (Signed)
Spoke to patient's wife.  Lab Result given . Verbalized understanding  prescription e-sent to [facility] pre wife request. Lab slip mailed . Wife states patient will be seeing dr Osborne Casco in [place] 2018

## 2017-03-17 NOTE — Telephone Encounter (Signed)
-----   Message from [name], MD sent at 03/09/2017  4:50 PM EDT ----- Potassium and magnesium are normal and his kidney function is the best that it's been in the last 4 months. Okay to go ahead with increased diuretic dose as discussed. Add potassium chloride 10 mEq once daily (they have some at home but did not know the dose to please check). Repeat same labs in 3-4 weeks - okay to get them done at [facility] if that is his preference

## 2017-03-17 NOTE — Telephone Encounter (Signed)
New message    Patient wife is returning call for blood work

## 2017-03-30 ENCOUNTER — Encounter: Payer: Self-pay | Admitting: Cardiovascular Disease

## 2017-03-30 ENCOUNTER — Other Ambulatory Visit: Payer: Self-pay | Admitting: Cardiovascular Disease

## 2017-03-30 ENCOUNTER — Ambulatory Visit: Payer: Medicare Other | Admitting: Podiatry

## 2017-03-30 NOTE — Telephone Encounter (Signed)
Rx(s) sent to pharmacy electronically.  

## 2017-04-04 ENCOUNTER — Ambulatory Visit (INDEPENDENT_AMBULATORY_CARE_PROVIDER_SITE_OTHER): Payer: Medicare Other | Admitting: Podiatry

## 2017-04-04 ENCOUNTER — Telehealth: Payer: Self-pay | Admitting: *Deleted

## 2017-04-04 ENCOUNTER — Encounter: Payer: Self-pay | Admitting: Podiatry

## 2017-04-04 DIAGNOSIS — M79676 Pain in unspecified toe(s): Secondary | ICD-10-CM

## 2017-04-04 DIAGNOSIS — M545 Low back pain: Secondary | ICD-10-CM | POA: Diagnosis not present

## 2017-04-04 DIAGNOSIS — M25552 Pain in left hip: Secondary | ICD-10-CM | POA: Diagnosis not present

## 2017-04-04 DIAGNOSIS — B351 Tinea unguium: Secondary | ICD-10-CM | POA: Diagnosis not present

## 2017-04-04 DIAGNOSIS — Q828 Other specified congenital malformations of skin: Secondary | ICD-10-CM

## 2017-04-04 NOTE — Telephone Encounter (Signed)
Received CPAP download from [facility] to review

## 2017-04-04 NOTE — Progress Notes (Signed)
   SUBJECTIVE Patient  presents to office today complaining of elongated, thickened nails. Pain while ambulating in shoes. Patient is unable to trim their own nails.   OBJECTIVE General Patient is awake, alert, and oriented x 3 and in no acute distress. Derm hyperkeratotic skin lesion noted to the distal tuft of the second digit right footSkin is dry and supple bilateral. Negative open lesions or macerations. Remaining integument unremarkable. Nails are tender, long, thickened and dystrophic with subungual debris, consistent with onychomycosis, 1-5 bilateral. No signs of infection noted. Vasc  DP and PT pedal pulses palpable bilaterally. Temperature gradient within normal limits.  Neuro Epicritic and protective threshold sensation diminished bilaterally.  Musculoskeletal Exam No symptomatic pedal deformities noted bilateral. Muscular strength within normal limits.  ASSESSMENT 1. Onychodystrophic nails 1-5 bilateral with hyperkeratosis of nails.  2. Onychomycosis of nail due to dermatophyte bilateral 3. Porokeratosis second digit right foot  PLAN OF CARE 1. Patient evaluated today.  2. Instructed to maintain good pedal hygiene and foot care.  3. Mechanical debridement of nails 1-5 bilaterally performed using a nail nipper. Filed with dremel without incident.  4. Excisional debridement of the porokeratosis lesion was performed to the distal tuft of the second digit right foot using a tissue nipper without incident or bleeding. 5.Return to clinic in 3 mos.    Edrick Kins, DPM [facility]  Dr. Edrick Kins, [address]                                        [place], [place] [address]                Office [phone]  Fax [phone]

## 2017-04-05 ENCOUNTER — Emergency Department: Payer: Self-pay

## 2017-04-05 ENCOUNTER — Emergency Department (HOSPITAL_BASED_OUTPATIENT_CLINIC_OR_DEPARTMENT_OTHER)
Admission: EM | Admit: 2017-04-05 | Discharge: 2017-04-05 | Disposition: A | Discharge status: Home / Self Care | Payer: Medicare Other | Attending: Emergency Medicine | Admitting: Emergency Medicine

## 2017-04-05 ENCOUNTER — Emergency Department (HOSPITAL_BASED_OUTPATIENT_CLINIC_OR_DEPARTMENT_OTHER): Payer: Medicare Other

## 2017-04-05 ENCOUNTER — Encounter (HOSPITAL_BASED_OUTPATIENT_CLINIC_OR_DEPARTMENT_OTHER): Payer: Self-pay | Admitting: *Deleted

## 2017-04-05 DIAGNOSIS — M25511 Pain in right shoulder: Secondary | ICD-10-CM | POA: Diagnosis not present

## 2017-04-05 DIAGNOSIS — S199XXA Unspecified injury of neck, initial encounter: Secondary | ICD-10-CM | POA: Diagnosis not present

## 2017-04-05 DIAGNOSIS — Z96659 Presence of unspecified artificial knee joint: Secondary | ICD-10-CM | POA: Diagnosis not present

## 2017-04-05 DIAGNOSIS — I110 Hypertensive heart disease with heart failure: Secondary | ICD-10-CM | POA: Diagnosis not present

## 2017-04-05 DIAGNOSIS — I272 Pulmonary hypertension, unspecified: Secondary | ICD-10-CM | POA: Insufficient documentation

## 2017-04-05 DIAGNOSIS — S42034A Nondisplaced fracture of lateral end of right clavicle, initial encounter for closed fracture: Secondary | ICD-10-CM | POA: Diagnosis not present

## 2017-04-05 DIAGNOSIS — S0990XA Unspecified injury of head, initial encounter: Secondary | ICD-10-CM | POA: Diagnosis not present

## 2017-04-05 DIAGNOSIS — W1789XA Other fall from one level to another, initial encounter: Secondary | ICD-10-CM | POA: Diagnosis not present

## 2017-04-05 DIAGNOSIS — I5032 Chronic diastolic (congestive) heart failure: Secondary | ICD-10-CM | POA: Insufficient documentation

## 2017-04-05 DIAGNOSIS — Z79899 Other long term (current) drug therapy: Secondary | ICD-10-CM | POA: Insufficient documentation

## 2017-04-05 DIAGNOSIS — Y929 Unspecified place or not applicable: Secondary | ICD-10-CM | POA: Diagnosis not present

## 2017-04-05 DIAGNOSIS — R51 Headache: Secondary | ICD-10-CM | POA: Insufficient documentation

## 2017-04-05 DIAGNOSIS — Y999 Unspecified external cause status: Secondary | ICD-10-CM | POA: Insufficient documentation

## 2017-04-05 DIAGNOSIS — I252 Old myocardial infarction: Secondary | ICD-10-CM | POA: Insufficient documentation

## 2017-04-05 DIAGNOSIS — Y939 Activity, unspecified: Secondary | ICD-10-CM | POA: Insufficient documentation

## 2017-04-05 DIAGNOSIS — Z950 Presence of cardiac pacemaker: Secondary | ICD-10-CM | POA: Insufficient documentation

## 2017-04-05 DIAGNOSIS — I2510 Atherosclerotic heart disease of native coronary artery without angina pectoris: Secondary | ICD-10-CM | POA: Diagnosis not present

## 2017-04-05 DIAGNOSIS — S4991XA Unspecified injury of right shoulder and upper arm, initial encounter: Secondary | ICD-10-CM | POA: Diagnosis present

## 2017-04-05 MED ORDER — ACETAMINOPHEN 500 MG PO TABS
1000.0000 mg | ORAL_TABLET | Freq: Once | ORAL | Status: AC
  Administered 2017-04-05: 1000 mg via ORAL
  Filled 2017-04-05: qty 2

## 2017-04-05 MED ORDER — OXYCODONE HCL 5 MG PO TABS
5.0000 mg | ORAL_TABLET | Freq: Once | ORAL | Status: AC
  Administered 2017-04-05: 5 mg via ORAL
  Filled 2017-04-05: qty 1

## 2017-04-05 MED ORDER — MORPHINE SULFATE 15 MG PO TABS: 15.0000 mg | ORAL_TABLET | ORAL | 0 refills | Status: DC | PRN

## 2017-04-05 NOTE — Discharge Instructions (Signed)
Take tylenol 1000mg(2 extra strength) four times a day.  ° °Then take the pain medicine if you feel like you need it. Narcotics do not help with the pain, they only make you care about it less.  You can become addicted to this, people may break into your house to steal it.  It will constipate you.  If you drive under the influence of this medicine you can get a DUI.   ° °

## 2017-04-05 NOTE — ED Notes (Signed)
Patient transported to CT 

## 2017-04-05 NOTE — ED Triage Notes (Signed)
He fell off a stool today. Injury to his right shoulder. Abrasion to his right elbow.

## 2017-04-05 NOTE — ED Notes (Signed)
ED Provider at bedside. 

## 2017-04-05 NOTE — ED Provider Notes (Signed)
[facility] DEPT [facility] Provider Note   CSN: [id number] Arrival date & time: 04/05/17  1814     History   Chief Complaint Chief Complaint  Patient presents with  . Fall    HPI [name] is a 83 y.o. male.  83 yo M with a chief complaint of a fall. The patient was sitting on a barstool waiting for a plumber to arrive when he fell asleep. Woke up on the floor. Complaining of pain to the right shoulder the right side of his head. Denied chest pain abdominal pain lower extremity or left upper arm pain. On eliquis.    The history is provided by the patient.  Fall  This is a new problem. The current episode started yesterday. The problem occurs constantly. The problem has not changed since onset.Pertinent negatives include no chest pain, no abdominal pain, no headaches and no shortness of breath. Nothing aggravates the symptoms. Nothing relieves the symptoms. He has tried nothing for the symptoms. The treatment provided no relief.    Past Medical History:  Diagnosis Date  . Coronary artery disease   . Diverticulitis   . Gout   . Hyperlipidemia   . MI (myocardial infarction) ([place])   . Nonischemic cardiomyopathy (H[facility])    mild  . OSA on CPAP   . Permanent atrial fibrillation ([place])   . Pulmonary hypertension [facility])     Patient Active Problem List   Diagnosis Date Noted  . Chronic atrial fibrillation ([place]) 11/23/2016  . Acute upper GI bleeding 11/23/2016  . Acute delirium 11/07/2016  . Acute confusion   . Acute hyperkalemia   . Secondary hypercoagulable state ([place])   . Transaminitis   . PAH (pulmonary artery hypertension) ([place])   . Acute renal insufficiency 11/03/2016  . Acute on chronic diastolic (congestive) heart failure ([place]) 09/09/2015  . Poor short term memory 11/05/2013  . Fatigue 10/13/2011  . Dyspnea on exertion 10/13/2011  . Atrial fibrillation, persistent, slow VR 10/13/2011  . Pacemaker single chamber, [place], 2013 10/13/2011  .  Restless leg syndrome 10/13/2011  . Chronic anticoagulation 10/13/2011  . Nonischemic cardiomyopathy - coronaries by angiography 2010 10/13/2011  . Sleep apnea, on C-Pap 10/13/2011  . Tremor, hereditary, benign 10/13/2011  . Depression 10/13/2011  . Pulmonary hypertension ([place]) 10/13/2011  . MRSA (methicillin resistant Staphylococcus aureus) carrier 10/13/2011  . BPH (benign prostatic hyperplasia) 10/13/2011    Past Surgical History:  Procedure Laterality Date  . CARDIAC CATHETERIZATION  11/07/2008   nonischemic cardiomyopathy,pulmonary hypertension  . COLOSTOMY    . COLOSTOMY REVERSAL    . CORONARY ANGIOPLASTY  06/01/1999   successful to ostium of the first diagonal  . ESOPHAGOGASTRODUODENOSCOPY N/A 08/22/2013   Procedure: ESOPHAGOGASTRODUODENOSCOPY (EGD);  Surgeon: [name], MD;  Location: [facility] ENDOSCOPY;  Service: Endoscopy;  Laterality: N/A;  h/p in file cabinet, jackie  . ESOPHAGOGASTRODUODENOSCOPY N/A 11/05/2014   Procedure: ESOPHAGOGASTRODUODENOSCOPY (EGD);  Surgeon: Clarene Essex, MD;  Location: [facility] ENDOSCOPY;  Service: Endoscopy;  Laterality: N/A;  . ESOPHAGOGASTRODUODENOSCOPY N/A 11/08/2016   Procedure: ESOPHAGOGASTRODUODENOSCOPY (EGD);  Surgeon: [name], MD;  Location: [facility] ENDOSCOPY;  Service: Endoscopy;  Laterality: N/A;  . HOT HEMOSTASIS N/A 11/05/2014   Procedure: HOT HEMOSTASIS (ARGON PLASMA COAGULATION/BICAP);  Surgeon: Clarene Essex, MD;  Location: [facility] ENDOSCOPY;  Service: Endoscopy;  Laterality: N/A;  . NM MYOCAR PERF WALL MOTION  11/24/2007   normal  . PERMANENT PACEMAKER INSERTION  10/04/2012   [employer]  . PERMANENT PACEMAKER INSERTION N/A 10/12/2011  Procedure: PERMANENT PACEMAKER INSERTION;  Surgeon: [name], MD;  Location: [facility] CATH LAB;  Service: Cardiovascular;  Laterality: N/A;  . REPLACEMENT TOTAL KNEE    . SAVORY DILATION N/A 08/22/2013   Procedure: SAVORY DILATION;  Surgeon: [name], MD;  Location: [facility] ENDOSCOPY;  Service: Endoscopy;  Laterality:  N/A;  . SHOULDER SURGERY    . US ECHOCARDIOGRAPHY  02/01/2011   LA is mod-severely dilated,AOV & root sclerotic,ca+ AOV leaflets       Home Medications    Prior to Admission medications   Medication Sig Start Date End Date Taking? Authorizing Provider  acetaminophen (TYLENOL) 650 MG CR tablet Take 650 mg by mouth every 8 (eight) hours as needed for pain.    [provider]  allopurinol (ZYLOPRIM) 100 MG tablet Take 2 tablets (200 mg total) by mouth daily. 11/09/16   Theodis Blaze, MD  COLCRYS 0.6 MG tablet Take 0.6 mg by mouth as needed (flareups).  03/01/13   [provider]  Docusate Calcium (STOOL SOFTENER PO) Take 2 tablets by mouth at bedtime.     [provider]  dutasteride (AVODART) 0.5 MG capsule Take 0.5 mg by mouth daily.    [provider]  ELIQUIS 5 MG TABS tablet Take 1 tablet by mouth 2 (two) times daily. 01/29/17   [provider]  escitalopram (LEXAPRO) 5 MG tablet Take 1 tablet (5 mg total) by mouth at bedtime. 11/09/16   Theodis Blaze, MD  furosemide (LASIX) 40 MG tablet Take 1 tablet (40 mg total) by mouth 2 (two) times daily. 03/09/17 03/04/18  Croitoru, Mihai, MD  hydrOXYzine (ATARAX/VISTARIL) 25 MG tablet TAKE 1 TABLET BY MOUTH EVERY 6 HOURS AS NEEDED FOR ITCHING 03/30/17   Croitoru, Mihai, MD  loratadine (CLARITIN) 10 MG tablet Take 10 mg by mouth daily.    [provider]  mirtazapine (REMERON) 15 MG tablet Take 15 mg by mouth at bedtime.    [provider]  morphine (MSIR) 15 MG tablet Take 1 tablet (15 mg total) by mouth every 4 (four) hours as needed for severe pain. 04/05/17   Deno Etienne, DO  Multiple Vitamins-Minerals (PRESERVISION AREDS 2) CAPS Take 1 capsule by mouth daily.    [provider]  nitroGLYCERIN (NITROSTAT) 0.4 MG SL tablet Place 0.4 mg under the tongue every 5 (five) minutes as needed. Chest pain    [provider]  Omega-3 Fatty Acids (OMEGA-3 PO) Take 1 capsule by mouth 2  (two) times daily.    [provider]  pantoprazole (PROTONIX) 40 MG tablet Take 1 tablet (40 mg total) by mouth daily. 11/10/16   Theodis Blaze, MD  potassium chloride (K-DUR) 10 MEQ tablet Take 1 tablet (10 mEq total) by mouth daily. 03/17/17   Croitoru, Mihai, MD  rOPINIRole (REQUIP) 3 MG tablet Take 1 tablet by mouth daily. Take 1 tab daily 08/02/15   [provider]  Tamsulosin HCl (FLOMAX) 0.4 MG CAPS Take 0.4 mg by mouth daily after breakfast.    [provider]  traMADol (ULTRAM) 50 MG tablet Take 50 mg by mouth 4 (four) times daily. For pain     [provider]    Family History Family History  Problem Relation Age of Onset  . Cancer Mother   . Heart attack Father   . Diabetes Brother     Social History Social History  Substance Use Topics  . Smoking status: Never Smoker  . Smokeless tobacco: Never Used  .  Alcohol use No     Allergies   Vicodin [hydrocodone-acetaminophen] and Hydrocodone   Review of Systems Review of Systems  Constitutional: Negative for chills and fever.  HENT: Negative for congestion and facial swelling.   Eyes: Negative for discharge and visual disturbance.  Respiratory: Negative for shortness of breath.   Cardiovascular: Negative for chest pain and palpitations.  Gastrointestinal: Negative for abdominal pain, diarrhea and vomiting.  Musculoskeletal: Positive for arthralgias and myalgias.  Skin: Negative for color change and rash.  Neurological: Negative for tremors, syncope and headaches.  Psychiatric/Behavioral: Negative for confusion and dysphoric mood.     Physical Exam Updated Vital Signs BP 123/90 (BP Location: Left Arm)   Pulse 62   Temp 98.3 F (36.8 C) (Oral)   Resp 18   Ht 5\' 7"  (1.702 m)   Wt 101.2 kg (223 lb)   SpO2 100%   BMI 34.93 kg/m   Physical Exam  Constitutional: He is oriented to person, place, and time. He appears well-developed and well-nourished.  HENT:  Head: Normocephalic  and atraumatic.  Eyes: Pupils are equal, round, and reactive to light. EOM are normal.  Neck: Normal range of motion. Neck supple. No JVD present.  Cardiovascular: Normal rate and regular rhythm.  Exam reveals no gallop and no friction rub.   No murmur heard. Pulmonary/Chest: No respiratory distress. He has no wheezes.  Abdominal: He exhibits no distension and no mass. There is no tenderness. There is no rebound and no guarding.  Musculoskeletal: Normal range of motion. He exhibits tenderness.  Tenderness to palpation worst about the right trapezius muscle. No midline spinal tenderness to the C-spine. No midline spinal missed about the back. No chest or abdominal tenderness. Full range of motion of the upper and lower extremities without pain.  Neurological: He is alert and oriented to person, place, and time.  Skin: No rash noted. No pallor.  Psychiatric: He has a normal mood and affect. His behavior is normal.  Nursing note and vitals reviewed.    ED Treatments / Results  Labs (all labs ordered are listed, but only abnormal results are displayed) Labs Reviewed - No data to display  EKG  EKG Interpretation None       Radiology Dg Shoulder Right  Result Date: 04/05/2017 CLINICAL DATA:  83 year old male status post fall off of stool today. Right shoulder pain. Prior surgery. EXAM: RIGHT SHOULDER - 2+ VIEW COMPARISON:  08/21/2016. FINDINGS: No glenohumeral joint dislocation. Right glenohumeral joint space loss. Chronic humeral head osteophytosis and postoperative changes (3 bone anchors in place). No proximal right humerus fracture. Chronic appearing discontinuity along the superior right glenoid. However, there is an acute fracture of the distal right clavicle, minimally displaced. No other right clavicle or scapula fracture identified. IMPRESSION: 1. Acute mildly displaced distal right clavicle fracture. 2. Stable chronic changes about the right glenohumeral joint. Electronically  Signed   By: Genevie Ann M.D.   On: 04/05/2017 19:34   Ct Head Wo Contrast  Result Date: 04/05/2017 CLINICAL DATA:  83 year old male status post fall off of stool today. Headache. EXAM: CT HEAD WITHOUT CONTRAST CT CERVICAL SPINE WITHOUT CONTRAST TECHNIQUE: Multidetector CT imaging of the head and cervical spine was performed following the standard protocol without intravenous contrast. Multiplanar CT image reconstructions of the cervical spine were also generated. COMPARISON:  Head and cervical spine CT 08/20/2014. Head CT 11/06/2016. FINDINGS: CT HEAD FINDINGS Brain: Stable cerebral volume. No acute intracranial hemorrhage identified. No midline shift, mass effect, or evidence  of intracranial mass lesion. No ventriculomegaly. Stable gray-white matter differentiation throughout the brain. Patchy white matter hypodensity and chronic lacunar infarcts in the left caudate and medial right thalamus. No cortically based acute infarct identified. Vascular: Calcified atherosclerosis at the skull base. Skull: No skull fracture identified. Sinuses/Orbits: Visualized paranasal sinuses and mastoids are stable and well pneumatized. Other: No acute orbit or scalp soft tissue findings. CT CERVICAL SPINE FINDINGS Osteopenia and mild motion artifact. Alignment: Chronic levoconvex cervical scoliosis. Increase straightening of upper cervical lordosis since 2016. Stable trace anterolisthesis at C7-T1. Bilateral posterior element alignment appears stable. Skull base and vertebrae: Visualized skull base is intact. No atlanto-occipital dissociation. No acute cervical spine fracture identified. Soft tissues and spinal canal: No prevertebral fluid or swelling. No visible canal hematoma. Bilateral carotid bifurcation calcified atherosclerosis. Disc levels: Chronic C2-C3 ankylosis. There is now developing or partial left posterior element ankylosis at C3-C4 which is new since 2016. Associated progression of bilateral C4-C5 facet arthropathy,  now severe on the left. New vacuum disc at C4-C5. Chronic lower cervical disc space loss and endplate spurring, K9-F8 and C6-C7, with chronic cervical spinal stenosis Upper chest: Grossly intact visible upper thoracic levels left chest cardiac pacemaker type lead. Negative lung apices. Tortuous proximal great vessels. Calcified aortic atherosclerosis. IMPRESSION: 1.  No acute traumatic injury identified. 2. Stable non contrast CT appearance of the brain, chronic small vessel disease. 3. No acute fracture or listhesis identified in the cervical spine. Ligamentous injury is not excluded. 4. Advanced chronic cervical spine degeneration, progressed at C3-C4 and C4-C5 since 2016. Chronic C2-C3 ankylosis and lower cervical degenerative spinal stenosis. Electronically Signed   By: Genevie Ann M.D.   On: 04/05/2017 19:43   Ct Cervical Spine Wo Contrast  Result Date: 04/05/2017 CLINICAL DATA:  84 year old male status post fall off of stool today. Headache. EXAM: CT HEAD WITHOUT CONTRAST CT CERVICAL SPINE WITHOUT CONTRAST TECHNIQUE: Multidetector CT imaging of the head and cervical spine was performed following the standard protocol without intravenous contrast. Multiplanar CT image reconstructions of the cervical spine were also generated. COMPARISON:  Head and cervical spine CT 08/20/2014. Head CT 11/06/2016. FINDINGS: CT HEAD FINDINGS Brain: Stable cerebral volume. No acute intracranial hemorrhage identified. No midline shift, mass effect, or evidence of intracranial mass lesion. No ventriculomegaly. Stable gray-white matter differentiation throughout the brain. Patchy white matter hypodensity and chronic lacunar infarcts in the left caudate and medial right thalamus. No cortically based acute infarct identified. Vascular: Calcified atherosclerosis at the skull base. Skull: No skull fracture identified. Sinuses/Orbits: Visualized paranasal sinuses and mastoids are stable and well pneumatized. Other: No acute orbit or scalp  soft tissue findings. CT CERVICAL SPINE FINDINGS Osteopenia and mild motion artifact. Alignment: Chronic levoconvex cervical scoliosis. Increase straightening of upper cervical lordosis since 2016. Stable trace anterolisthesis at C7-T1. Bilateral posterior element alignment appears stable. Skull base and vertebrae: Visualized skull base is intact. No atlanto-occipital dissociation. No acute cervical spine fracture identified. Soft tissues and spinal canal: No prevertebral fluid or swelling. No visible canal hematoma. Bilateral carotid bifurcation calcified atherosclerosis. Disc levels: Chronic C2-C3 ankylosis. There is now developing or partial left posterior element ankylosis at C3-C4 which is new since 2016. Associated progression of bilateral C4-C5 facet arthropathy, now severe on the left. New vacuum disc at C4-C5. Chronic lower cervical disc space loss and endplate spurring, H8-E9 and C6-C7, with chronic cervical spinal stenosis Upper chest: Grossly intact visible upper thoracic levels left chest cardiac pacemaker type lead. Negative lung apices. Tortuous proximal great  vessels. Calcified aortic atherosclerosis. IMPRESSION: 1.  No acute traumatic injury identified. 2. Stable non contrast CT appearance of the brain, chronic small vessel disease. 3. No acute fracture or listhesis identified in the cervical spine. Ligamentous injury is not excluded. 4. Advanced chronic cervical spine degeneration, progressed at C3-C4 and C4-C5 since 2016. Chronic C2-C3 ankylosis and lower cervical degenerative spinal stenosis. Electronically Signed   By: Genevie Ann M.D.   On: 04/05/2017 19:43    Procedures Procedures (including critical care time)  Medications Ordered in ED Medications  acetaminophen (TYLENOL) tablet 1,000 mg (1,000 mg Oral Given 04/05/17 1857)  oxyCODONE (Oxy IR/ROXICODONE) immediate release tablet 5 mg (5 mg Oral Given 04/05/17 2008)     Initial Impression / Assessment and Plan / ED Course  I have  reviewed the triage vital signs and the nursing notes.  Pertinent labs & imaging results that were available during my care of the patient were reviewed by me and considered in my medical decision making (see chart for details).     83 yo M with a cc of Of a fall from a bar stool. Patient struck the right side of his head. Will obtain a CT of the head and C-spine. Plain film of the right shoulder.  Xray with distal clavicle fx.  Will have the patient follow up with sports med. PCP follow up.   8:47 PM:  I have discussed the diagnosis/risks/treatment options with the patient and family and believe the pt to be eligible for discharge home to follow-up with sports med. We also discussed returning to the ED immediately if new or worsening sx occur. We discussed the sx which are most concerning (e.g., sudden worsening pain, fever, inability to tolerate by mouth) that necessitate immediate return. Medications administered to the patient during their visit and any new prescriptions provided to the patient are listed below.  Medications given during this visit Medications  acetaminophen (TYLENOL) tablet 1,000 mg (1,000 mg Oral Given 04/05/17 1857)  oxyCODONE (Oxy IR/ROXICODONE) immediate release tablet 5 mg (5 mg Oral Given 04/05/17 2008)     The patient appears reasonably screen and/or stabilized for discharge and I doubt any other medical condition or other [facility] requiring further screening, evaluation, or treatment in the ED at this time prior to discharge.    Final Clinical Impressions(s) / ED Diagnoses   Final diagnoses:  Closed nondisplaced fracture of acromial end of right clavicle, initial encounter    New Prescriptions Discharge Medication List as of 04/05/2017  8:04 PM    START taking these medications   Details  morphine (MSIR) 15 MG tablet Take 1 tablet (15 mg total) by mouth every 4 (four) hours as needed for severe pain., Starting Tue 04/05/2017, Print         Deno Etienne,  DO 04/05/17 2047

## 2017-04-05 NOTE — ED Notes (Signed)
Patient understood discharge instructions and follow up care.

## 2017-04-07 ENCOUNTER — Ambulatory Visit (INDEPENDENT_AMBULATORY_CARE_PROVIDER_SITE_OTHER): Payer: Medicare Other | Admitting: Family Medicine

## 2017-04-07 ENCOUNTER — Encounter: Payer: Self-pay | Admitting: Family Medicine

## 2017-04-07 DIAGNOSIS — S4991XA Unspecified injury of right shoulder and upper arm, initial encounter: Secondary | ICD-10-CM

## 2017-04-07 NOTE — Patient Instructions (Signed)
You have a distal clavicle fracture. Wear the sling at all times except to sleep, wash area. Come out of this twice a day to do motion exercises of your elbow. Icing 15 minutes at a time 3-4 times a day. Tylenol 500mg  1-2 tabs three times a day as needed for pain. Ok to take your tramadol in addition to this. Follow up with me in 2 weeks and we'll repeat your x-rays. Expect this to take 6-8 weeks to completely heal.

## 2017-04-08 DIAGNOSIS — S4991XA Unspecified injury of right shoulder and upper arm, initial encounter: Secondary | ICD-10-CM | POA: Insufficient documentation

## 2017-04-08 NOTE — Progress Notes (Signed)
PCP: [name], MD  Subjective:   HPI: Patient is a 83 y.o. male here for right shoulder injury.  Patient reports he was sitting on a barstool on 8/26 when he fell asleep and landed onto his right shoulder. Immediate pain, swelling, difficulty moving this arm. No prior injuries to this. Pain level 5/10 at worst, sharp superior right shoulder. Has been using sling, icing. Takes tylenol and tramadol which helps. Given morphine tablets but not taking. Bruising anterior shoulder.  No other skin changes. No numbness. Right handed.  Past Medical History:  Diagnosis Date  . Coronary artery disease   . Diverticulitis   . Gout   . Hyperlipidemia   . MI (myocardial infarction) ([place])   . Nonischemic cardiomyopathy (H[facility])    mild  . OSA on CPAP   . Permanent atrial fibrillation ([place])   . Pulmonary hypertension ([place])     Current Outpatient Prescriptions on File Prior to Visit  Medication Sig Dispense Refill  . acetaminophen (TYLENOL) 650 MG CR tablet Take 650 mg by mouth every 8 (eight) hours as needed for pain.    [name] allopurinol (ZYLOPRIM) 100 MG tablet Take 2 tablets (200 mg total) by mouth daily. 30 tablet 1  . COLCRYS 0.6 MG tablet Take 0.6 mg by mouth as needed (flareups).     [name] Calcium (STOOL SOFTENER PO) Take 2 tablets by mouth at bedtime.     . dutasteride (AVODART) 0.5 MG capsule Take 0.5 mg by mouth daily.    [name] ELIQUIS 5 MG TABS tablet Take 1 tablet by mouth 2 (two) times daily.    [name] escitalopram (LEXAPRO) 5 MG tablet Take 1 tablet (5 mg total) by mouth at bedtime. 30 tablet 0  . furosemide (LASIX) 40 MG tablet Take 1 tablet (40 mg total) by mouth 2 (two) times daily. 180 tablet 3  . hydrOXYzine (ATARAX/VISTARIL) 25 MG tablet TAKE 1 TABLET BY MOUTH EVERY 6 HOURS AS NEEDED FOR ITCHING 90 tablet 0  . loratadine (CLARITIN) 10 MG tablet Take 10 mg by mouth daily.    . mirtazapine (REMERON) 15 MG tablet Take 15 mg by mouth at bedtime.    [name] morphine (MSIR) 15 MG  tablet Take 1 tablet (15 mg total) by mouth every 4 (four) hours as needed for severe pain. 5 tablet 0  . Multiple Vitamins-Minerals (PRESERVISION AREDS 2) CAPS Take 1 capsule by mouth daily.    . nitroGLYCERIN (NITROSTAT) 0.4 MG SL tablet Place 0.4 mg under the tongue every 5 (five) minutes as needed. Chest pain    . Omega-3 Fatty Acids (OMEGA-3 PO) Take 1 capsule by mouth 2 (two) times daily.    . pantoprazole (PROTONIX) 40 MG tablet Take 1 tablet (40 mg total) by mouth daily. 30 tablet 1  . potassium chloride (K-DUR) 10 MEQ tablet Take 1 tablet (10 mEq total) by mouth daily. 90 tablet 3  . rOPINIRole (REQUIP) 3 MG tablet Take 1 tablet by mouth daily. Take 1 tab daily    . Tamsulosin HCl (FLOMAX) 0.4 MG CAPS Take 0.4 mg by mouth daily after breakfast.    . traMADol (ULTRAM) 50 MG tablet Take 50 mg by mouth 4 (four) times daily. For pain      Current Facility-Administered Medications on File Prior to Visit  Medication Dose Route Frequency Provider Last Rate Last Dose  . betamethasone acetate-betamethasone sodium phosphate (CELESTONE) injection 12 mg  12 mg Intramuscular Once Edrick Kins, DPM  Past Surgical History:  Procedure Laterality Date  . CARDIAC CATHETERIZATION  11/07/2008   nonischemic cardiomyopathy,pulmonary hypertension  . COLOSTOMY    . COLOSTOMY REVERSAL    . CORONARY ANGIOPLASTY  06/01/1999   successful to ostium of the first diagonal  . ESOPHAGOGASTRODUODENOSCOPY N/A 08/22/2013   Procedure: ESOPHAGOGASTRODUODENOSCOPY (EGD);  Surgeon: [name], MD;  Location: [facility] ENDOSCOPY;  Service: Endoscopy;  Laterality: N/A;  h/p in file cabinet, jackie  . ESOPHAGOGASTRODUODENOSCOPY N/A 11/05/2014   Procedure: ESOPHAGOGASTRODUODENOSCOPY (EGD);  Surgeon: Clarene Essex, MD;  Location: [facility] ENDOSCOPY;  Service: Endoscopy;  Laterality: N/A;  . ESOPHAGOGASTRODUODENOSCOPY N/A 11/08/2016   Procedure: ESOPHAGOGASTRODUODENOSCOPY (EGD);  Surgeon: [name], MD;  Location: [facility] ENDOSCOPY;   Service: Endoscopy;  Laterality: N/A;  . HOT HEMOSTASIS N/A 11/05/2014   Procedure: HOT HEMOSTASIS (ARGON PLASMA COAGULATION/BICAP);  Surgeon: Clarene Essex, MD;  Location: [facility] ENDOSCOPY;  Service: Endoscopy;  Laterality: N/A;  . NM MYOCAR PERF WALL MOTION  11/24/2007   normal  . PERMANENT PACEMAKER INSERTION  10/04/2012   [employer]  . PERMANENT PACEMAKER INSERTION N/A 10/12/2011   Procedure: PERMANENT PACEMAKER INSERTION;  Surgeon: [name], MD;  Location: [facility] CATH LAB;  Service: Cardiovascular;  Laterality: N/A;  . REPLACEMENT TOTAL KNEE    . SAVORY DILATION N/A 08/22/2013   Procedure: SAVORY DILATION;  Surgeon: [name], MD;  Location: [facility] ENDOSCOPY;  Service: Endoscopy;  Laterality: N/A;  . SHOULDER SURGERY    . US ECHOCARDIOGRAPHY  02/01/2011   LA is mod-severely dilated,AOV & root sclerotic,ca+ AOV leaflets    Allergies  Allergen Reactions  . Vicodin [Hydrocodone-Acetaminophen] Nausea And Vomiting  . Hydrocodone Nausea Only    Social History   Social History  . Marital status: Married    Spouse name: N/A  . Number of children: N/A  . Years of education: N/A   Occupational History  . Not on file.   Social History Main Topics  . Smoking status: Never Smoker  . Smokeless tobacco: Never Used  . Alcohol use No  . Drug use: No  . Sexual activity: Not on file   Other Topics Concern  . Not on file   Social History Narrative  . No narrative on file    Family History  Problem Relation Age of Onset  . Cancer Mother   . Heart attack Father   . Diabetes Brother     BP (!) 99/57   Pulse 60   Ht 5\' 4"  (1.626 m)   Wt 216 lb (98 kg)   BMI 37.08 kg/m   Review of Systems: See HPI above.     Objective:  Physical Exam:  Gen: NAD, comfortable in exam room  Right shoulder: Ecchymoses anterior shoulder.   Mild swelling.  No other deformity. TTP distal clavicle.  No other tenderness including proximal biceps. FROM elbow, wrist, digits.  Did not test shoulder  motion due to fracture. Negative Yergasons. NV intact distally.  Left shoulder: FROM without pain.   Assessment & Plan:  1. Right shoulder injury - independently reviewed radiographs - noted distal clavicle fracture, nondisplaced.  Should do well with conservative measures over 6-8 weeks.  Icing, sling, tylenol with tramadol as needed.  F/u in 2 weeks to reevaluate, repeat x-rays.

## 2017-04-08 NOTE — Assessment & Plan Note (Signed)
independently reviewed radiographs - noted distal clavicle fracture, nondisplaced.  Should do well with conservative measures over 6-8 weeks.  Icing, sling, tylenol with tramadol as needed.  F/u in 2 weeks to reevaluate, repeat x-rays.

## 2017-04-14 NOTE — Telephone Encounter (Signed)
Left message to call back.  Dr. Claiborne Billings reviewed CPAP download.  Patient needs to increase usage, needs new machine.  Needs new sleep study completed to get new machine.

## 2017-04-14 NOTE — Telephone Encounter (Signed)
Spoke to wife (ok per DPR). Aware of CPAP download recommendations.   Request to wait until the beginning of the year to have new sleep study as both patient and wife have had accidents (falls) recently and are trying to recover.  Also will be traveling in November per wife.  Wife states she will call back in Jan to get this scheduled.

## 2017-04-26 ENCOUNTER — Other Ambulatory Visit: Payer: Self-pay | Admitting: Sports Medicine

## 2017-04-26 DIAGNOSIS — M545 Low back pain: Secondary | ICD-10-CM | POA: Diagnosis not present

## 2017-04-26 DIAGNOSIS — M549 Dorsalgia, unspecified: Secondary | ICD-10-CM

## 2017-04-28 ENCOUNTER — Ambulatory Visit
Admission: RE | Admit: 2017-04-28 | Discharge: 2017-04-28 | Disposition: A | Discharge status: Home / Self Care | Payer: Medicare Other | Source: Ambulatory Visit | Attending: Sports Medicine | Admitting: Sports Medicine

## 2017-04-28 DIAGNOSIS — I8312 Varicose veins of left lower extremity with inflammation: Secondary | ICD-10-CM | POA: Diagnosis not present

## 2017-04-28 DIAGNOSIS — M545 Low back pain: Secondary | ICD-10-CM | POA: Diagnosis not present

## 2017-04-28 DIAGNOSIS — M549 Dorsalgia, unspecified: Secondary | ICD-10-CM

## 2017-04-28 DIAGNOSIS — I83892 Varicose veins of left lower extremities with other complications: Secondary | ICD-10-CM | POA: Diagnosis not present

## 2017-05-04 DIAGNOSIS — I8311 Varicose veins of right lower extremity with inflammation: Secondary | ICD-10-CM | POA: Diagnosis not present

## 2017-05-04 DIAGNOSIS — I83891 Varicose veins of right lower extremities with other complications: Secondary | ICD-10-CM | POA: Diagnosis not present

## 2017-05-10 DIAGNOSIS — M545 Low back pain: Secondary | ICD-10-CM | POA: Diagnosis not present

## 2017-05-10 DIAGNOSIS — M5137 Other intervertebral disc degeneration, lumbosacral region: Secondary | ICD-10-CM | POA: Diagnosis not present

## 2017-05-10 DIAGNOSIS — M25551 Pain in right hip: Secondary | ICD-10-CM | POA: Diagnosis not present

## 2017-05-12 DIAGNOSIS — I83202 Varicose veins of unspecified lower extremity with both ulcer of calf and inflammation: Secondary | ICD-10-CM | POA: Diagnosis not present

## 2017-05-12 DIAGNOSIS — L97221 Non-pressure chronic ulcer of left calf limited to breakdown of skin: Secondary | ICD-10-CM | POA: Diagnosis not present

## 2017-05-12 DIAGNOSIS — I8312 Varicose veins of left lower extremity with inflammation: Secondary | ICD-10-CM | POA: Diagnosis not present

## 2017-05-12 DIAGNOSIS — I83892 Varicose veins of left lower extremities with other complications: Secondary | ICD-10-CM | POA: Diagnosis not present

## 2017-05-16 DIAGNOSIS — L97221 Non-pressure chronic ulcer of left calf limited to breakdown of skin: Secondary | ICD-10-CM | POA: Diagnosis not present

## 2017-05-16 DIAGNOSIS — I8312 Varicose veins of left lower extremity with inflammation: Secondary | ICD-10-CM | POA: Diagnosis not present

## 2017-05-17 DIAGNOSIS — I8311 Varicose veins of right lower extremity with inflammation: Secondary | ICD-10-CM | POA: Diagnosis not present

## 2017-05-17 DIAGNOSIS — I83203 Varicose veins of unspecified lower extremity with both ulcer of ankle and inflammation: Secondary | ICD-10-CM | POA: Diagnosis not present

## 2017-05-17 DIAGNOSIS — L97211 Non-pressure chronic ulcer of right calf limited to breakdown of skin: Secondary | ICD-10-CM | POA: Diagnosis not present

## 2017-05-18 DIAGNOSIS — I8310 Varicose veins of unspecified lower extremity with inflammation: Secondary | ICD-10-CM | POA: Diagnosis not present

## 2017-05-18 DIAGNOSIS — I5033 Acute on chronic diastolic (congestive) heart failure: Secondary | ICD-10-CM | POA: Diagnosis not present

## 2017-05-18 DIAGNOSIS — N183 Chronic kidney disease, stage 3 (moderate): Secondary | ICD-10-CM | POA: Diagnosis not present

## 2017-05-18 DIAGNOSIS — L84 Corns and callosities: Secondary | ICD-10-CM | POA: Diagnosis not present

## 2017-05-18 DIAGNOSIS — Z950 Presence of cardiac pacemaker: Secondary | ICD-10-CM | POA: Diagnosis not present

## 2017-05-18 DIAGNOSIS — Z23 Encounter for immunization: Secondary | ICD-10-CM | POA: Diagnosis not present

## 2017-05-18 DIAGNOSIS — M109 Gout, unspecified: Secondary | ICD-10-CM | POA: Diagnosis not present

## 2017-05-18 DIAGNOSIS — R600 Localized edema: Secondary | ICD-10-CM | POA: Diagnosis not present

## 2017-05-18 DIAGNOSIS — E668 Other obesity: Secondary | ICD-10-CM | POA: Diagnosis not present

## 2017-05-18 DIAGNOSIS — Z6834 Body mass index (BMI) 34.0-34.9, adult: Secondary | ICD-10-CM | POA: Diagnosis not present

## 2017-05-18 DIAGNOSIS — R7301 Impaired fasting glucose: Secondary | ICD-10-CM | POA: Diagnosis not present

## 2017-05-18 DIAGNOSIS — D692 Other nonthrombocytopenic purpura: Secondary | ICD-10-CM | POA: Diagnosis not present

## 2017-05-18 DIAGNOSIS — R808 Other proteinuria: Secondary | ICD-10-CM | POA: Diagnosis not present

## 2017-06-08 ENCOUNTER — Ambulatory Visit (INDEPENDENT_AMBULATORY_CARE_PROVIDER_SITE_OTHER): Payer: Medicare Other | Admitting: *Deleted

## 2017-06-08 DIAGNOSIS — I428 Other cardiomyopathies: Secondary | ICD-10-CM

## 2017-06-08 DIAGNOSIS — I8311 Varicose veins of right lower extremity with inflammation: Secondary | ICD-10-CM | POA: Diagnosis not present

## 2017-06-08 DIAGNOSIS — I83891 Varicose veins of right lower extremities with other complications: Secondary | ICD-10-CM | POA: Diagnosis not present

## 2017-06-09 DIAGNOSIS — L03113 Cellulitis of right upper limb: Secondary | ICD-10-CM | POA: Diagnosis not present

## 2017-06-09 DIAGNOSIS — Z6833 Body mass index (BMI) 33.0-33.9, adult: Secondary | ICD-10-CM | POA: Diagnosis not present

## 2017-06-09 NOTE — Progress Notes (Signed)
Remote pacemaker transmission.   

## 2017-06-10 ENCOUNTER — Encounter: Payer: Self-pay | Admitting: Cardiology

## 2017-06-15 ENCOUNTER — Encounter: Payer: Self-pay | Admitting: Cardiovascular Disease

## 2017-06-21 ENCOUNTER — Other Ambulatory Visit: Payer: Self-pay | Admitting: Cardiovascular Disease

## 2017-06-21 DIAGNOSIS — M545 Low back pain: Secondary | ICD-10-CM | POA: Diagnosis not present

## 2017-07-04 DIAGNOSIS — M47817 Spondylosis without myelopathy or radiculopathy, lumbosacral region: Secondary | ICD-10-CM | POA: Diagnosis not present

## 2017-07-04 DIAGNOSIS — M461 Sacroiliitis, not elsewhere classified: Secondary | ICD-10-CM | POA: Diagnosis not present

## 2017-07-04 DIAGNOSIS — M545 Low back pain: Secondary | ICD-10-CM | POA: Diagnosis not present

## 2017-07-05 LAB — CUP PACEART REMOTE DEVICE CHECK
Battery Remaining Longevity: 66 mo
Date Time Interrogation Session: 20181127133141
Implantable Lead Implant Date: 20130305
Implantable Lead Location: 753860
Implantable Lead Model: 4137
Implantable Lead Serial Number: 29020819
Implantable Pulse Generator Implant Date: 20130305
Lead Channel Pacing Threshold Amplitude: 1.1 V
Lead Channel Pacing Threshold Pulse Width: 0.4 ms
Lead Channel Sensing Intrinsic Amplitude: 16.5 mV
Lead Channel Setting Pacing Pulse Width: 0.4 ms
MDC IDC MSMT LEADCHNL RV IMPEDANCE VALUE: 655 Ohm
MDC IDC SET LEADCHNL RV PACING AMPLITUDE: 1.6 V
MDC IDC SET LEADCHNL RV SENSING SENSITIVITY: 2.5 mV
MDC IDC STAT BRADY RV PERCENT PACED: 68 %
Pulse Gen Serial Number: 113290

## 2017-07-06 ENCOUNTER — Ambulatory Visit (INDEPENDENT_AMBULATORY_CARE_PROVIDER_SITE_OTHER): Payer: Medicare Other | Admitting: Podiatry

## 2017-07-06 ENCOUNTER — Other Ambulatory Visit: Payer: Self-pay | Admitting: Cardiovascular Disease

## 2017-07-06 ENCOUNTER — Encounter: Payer: Self-pay | Admitting: Podiatry

## 2017-07-06 DIAGNOSIS — L989 Disorder of the skin and subcutaneous tissue, unspecified: Secondary | ICD-10-CM | POA: Diagnosis not present

## 2017-07-06 DIAGNOSIS — B351 Tinea unguium: Secondary | ICD-10-CM

## 2017-07-06 DIAGNOSIS — M79676 Pain in unspecified toe(s): Secondary | ICD-10-CM | POA: Diagnosis not present

## 2017-07-07 NOTE — Telephone Encounter (Signed)
Rx(s) sent to pharmacy electronically.  

## 2017-07-10 NOTE — Progress Notes (Signed)
   SUBJECTIVE Patient presents to office today complaining of elongated, thickened nails. Pain while ambulating in shoes. Patient is unable to trim their own nails.    Past Medical History:  Diagnosis Date  . Coronary artery disease   . Diverticulitis   . Gout   . Hyperlipidemia   . MI (myocardial infarction) ([place])   . Nonischemic cardiomyopathy (HCC)    mild  . OSA on CPAP   . Permanent atrial fibrillation ([place])   . Pulmonary hypertension ([place])      OBJECTIVE General Patient is awake, alert, and oriented x 3 and in no acute distress. Derm Hyperkeratotic skin lesion noted to the distal tuft of the second digit right foot. Skin is dry and supple bilateral. Negative open lesions or macerations. Remaining integument unremarkable. Nails are tender, long, thickened and dystrophic with subungual debris, consistent with onychomycosis, 1-5 bilateral. No signs of infection noted. Vasc  DP and PT pedal pulses palpable bilaterally. Temperature gradient within normal limits.  Neuro Epicritic and protective threshold sensation diminished bilaterally.  Musculoskeletal Exam No symptomatic pedal deformities noted bilateral. Muscular strength within normal limits.  ASSESSMENT 1. Onychodystrophic nails 1-5 bilateral with hyperkeratosis of nails.  2. Onychomycosis of nail due to dermatophyte bilateral 3. Porokeratosis second digit right foot  PLAN OF CARE 1. Patient evaluated today.  2. Instructed to maintain good pedal hygiene and foot care.  3. Mechanical debridement of nails 1-5 bilaterally performed using a nail nipper. Filed with dremel without incident.  4. Excisional debridement of the porokeratosis lesion was performed to the distal tuft of the second digit right foot using a tissue nipper without incident or bleeding. 5. Return to clinic in 3 mos.    Edrick Kins, DPM [facility]  Dr. Edrick Kins, [address]                                          [place], [place] [address]                Office [phone]  Fax [phone]

## 2017-07-11 DIAGNOSIS — M47817 Spondylosis without myelopathy or radiculopathy, lumbosacral region: Secondary | ICD-10-CM | POA: Diagnosis not present

## 2017-07-11 DIAGNOSIS — M461 Sacroiliitis, not elsewhere classified: Secondary | ICD-10-CM | POA: Diagnosis not present

## 2017-07-11 DIAGNOSIS — M545 Low back pain: Secondary | ICD-10-CM | POA: Diagnosis not present

## 2017-07-12 DIAGNOSIS — I83891 Varicose veins of right lower extremities with other complications: Secondary | ICD-10-CM | POA: Diagnosis not present

## 2017-07-12 DIAGNOSIS — I8311 Varicose veins of right lower extremity with inflammation: Secondary | ICD-10-CM | POA: Diagnosis not present

## 2017-07-13 DIAGNOSIS — M545 Low back pain: Secondary | ICD-10-CM | POA: Diagnosis not present

## 2017-07-13 DIAGNOSIS — M461 Sacroiliitis, not elsewhere classified: Secondary | ICD-10-CM | POA: Diagnosis not present

## 2017-07-13 DIAGNOSIS — M47817 Spondylosis without myelopathy or radiculopathy, lumbosacral region: Secondary | ICD-10-CM | POA: Diagnosis not present

## 2017-07-14 ENCOUNTER — Telehealth: Payer: Self-pay | Admitting: Cardiovascular Disease

## 2017-07-14 DIAGNOSIS — I428 Other cardiomyopathies: Secondary | ICD-10-CM

## 2017-07-14 NOTE — Telephone Encounter (Signed)
When I saw him at 223, I thought he was 5-6 lb too "wet", so he may as much as 10 lb over his "dry weight". I wonder if he would feel better at a weight around 220 lb. Please increase furosemide by taking additional 40 mg in the early afternoon for 4 days. Check BMET and BNP early next week and call us with weight on Monday.  Thanks [employer]

## 2017-07-14 NOTE — Telephone Encounter (Signed)
Returned the call to the patient's wife, per the dpr. She stated the that patient recently had a fall and hurt his back. He is currently having PT. The therapist was concerned with the patient's shortness of breath and advised her to call the office.  The patient does have CHF and has a history of edema plus shortness of breath. His wife stated that she does not think that it is any worse than his norm and has not noted any extra edema lately. He does have a hard time when bending over and having shortness of breath.  He had missed two days of his furosemide (80 mg) at the end of November but is now back of his normal regimen.   The patient's weight yesterday was 227.5 pounds. In August at his last office visit his weight was 223. She stated that this has fluctuated but stays around the 223-227 range. Will route to the provider for further recommendation if needed.

## 2017-07-14 NOTE — Telephone Encounter (Signed)
Mrs.  [name] ( Wife) is calling because [name] is having difficulty getting some of the fluid off he has CHF and he is having Shortness of Breath . Please call

## 2017-07-14 NOTE — Telephone Encounter (Signed)
Left message to call back  

## 2017-07-15 MED ORDER — FUROSEMIDE 40 MG PO TABS: 40.0000 mg | ORAL_TABLET | Freq: Two times a day (BID) | ORAL | 3 refills | Status: DC

## 2017-07-15 NOTE — Telephone Encounter (Signed)
Spoke with pt wife, she voiced understanding of medication instructions. New script sent to the pharmacy and lab orders placed.

## 2017-07-15 NOTE — Telephone Encounter (Signed)
Returning [name]'s call from yesterday.

## 2017-07-22 DIAGNOSIS — H524 Presbyopia: Secondary | ICD-10-CM | POA: Diagnosis not present

## 2017-07-22 DIAGNOSIS — H353132 Nonexudative age-related macular degeneration, bilateral, intermediate dry stage: Secondary | ICD-10-CM | POA: Diagnosis not present

## 2017-07-22 DIAGNOSIS — Z961 Presence of intraocular lens: Secondary | ICD-10-CM | POA: Diagnosis not present

## 2017-08-03 DIAGNOSIS — M461 Sacroiliitis, not elsewhere classified: Secondary | ICD-10-CM | POA: Diagnosis not present

## 2017-08-03 DIAGNOSIS — M47817 Spondylosis without myelopathy or radiculopathy, lumbosacral region: Secondary | ICD-10-CM | POA: Diagnosis not present

## 2017-08-03 DIAGNOSIS — M545 Low back pain: Secondary | ICD-10-CM | POA: Diagnosis not present

## 2017-08-04 DIAGNOSIS — M25551 Pain in right hip: Secondary | ICD-10-CM | POA: Diagnosis not present

## 2017-08-04 DIAGNOSIS — M5137 Other intervertebral disc degeneration, lumbosacral region: Secondary | ICD-10-CM | POA: Diagnosis not present

## 2017-08-04 DIAGNOSIS — M545 Low back pain: Secondary | ICD-10-CM | POA: Diagnosis not present

## 2017-08-23 ENCOUNTER — Ambulatory Visit (INDEPENDENT_AMBULATORY_CARE_PROVIDER_SITE_OTHER): Payer: Medicare Other | Admitting: Cardiovascular Disease

## 2017-08-23 ENCOUNTER — Encounter: Payer: Self-pay | Admitting: Cardiovascular Disease

## 2017-08-23 VITALS — BP 118/62 | HR 60 | Ht 67.0 in | Wt 216.0 lb

## 2017-08-23 DIAGNOSIS — I5032 Chronic diastolic (congestive) heart failure: Secondary | ICD-10-CM | POA: Diagnosis not present

## 2017-08-23 DIAGNOSIS — I481 Persistent atrial fibrillation: Secondary | ICD-10-CM | POA: Diagnosis not present

## 2017-08-23 DIAGNOSIS — R0602 Shortness of breath: Secondary | ICD-10-CM | POA: Diagnosis not present

## 2017-08-23 DIAGNOSIS — I4819 Other persistent atrial fibrillation: Secondary | ICD-10-CM

## 2017-08-23 DIAGNOSIS — Z950 Presence of cardiac pacemaker: Secondary | ICD-10-CM

## 2017-08-23 DIAGNOSIS — Z7901 Long term (current) use of anticoagulants: Secondary | ICD-10-CM

## 2017-08-23 DIAGNOSIS — I2721 Secondary pulmonary arterial hypertension: Secondary | ICD-10-CM | POA: Diagnosis not present

## 2017-08-23 DIAGNOSIS — G4733 Obstructive sleep apnea (adult) (pediatric): Secondary | ICD-10-CM | POA: Diagnosis not present

## 2017-08-23 NOTE — Progress Notes (Signed)
Patient ID: [name], male   DOB: [birth year], 84 y.o.   MRN: [id number]    Cardiology Office Note    Date:  08/23/2017   ID:  AUSTON HALFMANN, DOB 1932/01/17, MRN [id number]  PCP:  [name], MD  Cardiologist:  Shelva Majestic, M.D.; [name], MD   No chief complaint on file.   History of Present Illness:  [name] is a 84 y.o. male with long-standing permanent atrial fibrillation with slow ventricular response, single chamber pacemaker, obstructive sleep apnea, systemic hypertension, nonischemic cardiomyopathy with mildly depressed left ventricular systolic function, obesity, gout and BPH.  He's here for pacemaker follow-up. He has not had any new major health problems and has not had any hospitalizations in the last several months. She did fall off a barstool last August and had a fracture of his right clavicle. It sounds like he fell asleep and fell off the chair. He has had several episodes of falling asleep during the daytime and his wife wonders whether he has "narcolepsy". He has known obstructive sleep apnea and was still scoring very high on the Epworth sleepiness scale a year ago. He has not seen Dr. Claiborne Billings since last June, when we're trying to get him a new CPAP machine. [employer] we haven't been successful in achieving that.  He also had another fall in the bathtub and has bruises on his back. Thankfully he has not had any head injuries or any serious bleeding problems. He is compliant with anticoagulation with Eliquis. He has not had any focal neurological events or stroke.  He has noticed worsening dyspnea on exertion but mostly complains of fatigue and sleepiness. He is pale. His hemoglobin was only 7.5-8.6, in the setting of acute renal insufficiency. His creatinine during that admission peaked at 4.0, but was back to his probable baseline of 1.4 in August 2018. When rechecked on 11/25/2016 his hemoglobin was up to 10.6, but I don't office. Recheck  since.  He denies angina pectoris and has not required sublingual nitroglycerin and a very long time. He does not have orthopnea or PND. He does have lower extremity edema: it mostly resolves with overnight recumbency, but promptly recurs the next day.  Today he has atrial fibrillation and 100% ventricular pacing on his ECG. Underlying rhythm is atrial fibrillation with slow ventricular response in the 45-50 range. Pacemaker interrogation shows that ventricular pacing occurs 68% of the time. He has rare and brief episodes of high ventricular rate, without electrograms available for review. Lead parameters are excellent. Battery longevity is estimated at about another 5 years [employer]  implanted in 2015).   In 2010 he had normal coronary arteries at angiography and LV EF by LV angina was 40-45%, in 2012 echo showed LV EF of 50-55%. In 2015 EF was 55-60% by echo. In March 2018, EF 50-55 %, moderate to severe tricuspid regurgitation and an estimated systolic PA pressure of 43 mmHg. Left atrium severely dilated at 51 mm.   Past Medical History:  Diagnosis Date  . Coronary artery disease   . Diverticulitis   . Gout   . Hyperlipidemia   . MI (myocardial infarction) ([place])   . Nonischemic cardiomyopathy (H[facility])    mild  . OSA on CPAP   . Permanent atrial fibrillation ([place])   . Pulmonary hypertension ([place])     Past Surgical History:  Procedure Laterality Date  . CARDIAC CATHETERIZATION  11/07/2008   nonischemic cardiomyopathy,pulmonary hypertension  . COLOSTOMY    .  COLOSTOMY REVERSAL    . CORONARY ANGIOPLASTY  06/01/1999   successful to ostium of the first diagonal  . ESOPHAGOGASTRODUODENOSCOPY N/A 08/22/2013   Procedure: ESOPHAGOGASTRODUODENOSCOPY (EGD);  Surgeon: [name], MD;  Location: [facility] ENDOSCOPY;  Service: Endoscopy;  Laterality: N/A;  h/p in file cabinet, jackie  . ESOPHAGOGASTRODUODENOSCOPY N/A 11/05/2014   Procedure: ESOPHAGOGASTRODUODENOSCOPY (EGD);  Surgeon: Clarene Essex, MD;  Location: [facility] ENDOSCOPY;  Service: Endoscopy;  Laterality: N/A;  . ESOPHAGOGASTRODUODENOSCOPY N/A 11/08/2016   Procedure: ESOPHAGOGASTRODUODENOSCOPY (EGD);  Surgeon: [name], MD;  Location: [facility] ENDOSCOPY;  Service: Endoscopy;  Laterality: N/A;  . HOT HEMOSTASIS N/A 11/05/2014   Procedure: HOT HEMOSTASIS (ARGON PLASMA COAGULATION/BICAP);  Surgeon: Clarene Essex, MD;  Location: [facility] ENDOSCOPY;  Service: Endoscopy;  Laterality: N/A;  . NM MYOCAR PERF WALL MOTION  11/24/2007   normal  . PERMANENT PACEMAKER INSERTION  10/04/2012   [employer]  . PERMANENT PACEMAKER INSERTION N/A 10/12/2011   Procedure: PERMANENT PACEMAKER INSERTION;  Surgeon: [name], MD;  Location: [facility] CATH LAB;  Service: Cardiovascular;  Laterality: N/A;  . REPLACEMENT TOTAL KNEE    . SAVORY DILATION N/A 08/22/2013   Procedure: SAVORY DILATION;  Surgeon: [name], MD;  Location: [facility] ENDOSCOPY;  Service: Endoscopy;  Laterality: N/A;  . SHOULDER SURGERY    . US ECHOCARDIOGRAPHY  02/01/2011   LA is mod-severely dilated,AOV & root sclerotic,ca+ AOV leaflets    Outpatient Medications Prior to Visit  Medication Sig Dispense Refill  . acetaminophen (TYLENOL) 650 MG CR tablet Take 650 mg by mouth every 8 (eight) hours as needed for pain.    [name] allopurinol (ZYLOPRIM) 100 MG tablet Take 2 tablets (200 mg total) by mouth daily. 30 tablet 1  . COLCRYS 0.6 MG tablet Take 0.6 mg by mouth as needed (flareups).     [name] Calcium (STOOL SOFTENER PO) Take 2 tablets by mouth at bedtime.     . dutasteride (AVODART) 0.5 MG capsule Take 0.5 mg by mouth daily.    [name] ELIQUIS 5 MG TABS tablet Take 1 tablet by mouth 2 (two) times daily.    [name] escitalopram (LEXAPRO) 5 MG tablet Take 1 tablet (5 mg total) by mouth at bedtime. 30 tablet 0  . furosemide (LASIX) 40 MG tablet Take 1 tablet (40 mg total) by mouth 2 (two) times daily. Take extra 40 mg midday x 4 days only 184 tablet 3  . hydrOXYzine (ATARAX/VISTARIL) 25 MG tablet TAKE 1  TABLET BY MOUTH EVERY 6 HOURS AS NEEDED FOR ITCHING 90 tablet 2  . loratadine (CLARITIN) 10 MG tablet Take 10 mg by mouth daily.    . mirtazapine (REMERON) 15 MG tablet Take 15 mg by mouth at bedtime.    [name] morphine (MSIR) 15 MG tablet Take 1 tablet (15 mg total) by mouth every 4 (four) hours as needed for severe pain. 5 tablet 0  . Multiple Vitamins-Minerals (PRESERVISION AREDS 2) CAPS Take 1 capsule by mouth daily.    . nitroGLYCERIN (NITROSTAT) 0.4 MG SL tablet Place 0.4 mg under the tongue every 5 (five) minutes as needed. Chest pain    . Omega-3 Fatty Acids (OMEGA-3 PO) Take 1 capsule by mouth 2 (two) times daily.    . pantoprazole (PROTONIX) 40 MG tablet Take 1 tablet (40 mg total) by mouth daily. 30 tablet 1  . potassium chloride (K-DUR) 10 MEQ tablet Take 1 tablet (10 mEq total) by mouth daily. 90 tablet 3  . rOPINIRole (REQUIP) 3 MG tablet Take  1 tablet by mouth daily. Take 1 tab daily    . Tamsulosin HCl (FLOMAX) 0.4 MG CAPS Take 0.4 mg by mouth daily after breakfast.    . traMADol (ULTRAM) 50 MG tablet Take 50 mg by mouth 4 (four) times daily. For pain      Facility-Administered Medications Prior to Visit  Medication Dose Route Frequency Provider Last Rate Last Dose  . betamethasone acetate-betamethasone sodium phosphate (CELESTONE) injection 12 mg  12 mg Intramuscular Once Edrick Kins, DPM         Allergies:   Hydrocodone-acetaminophen; Vicodin [hydrocodone-acetaminophen]; and Hydrocodone   Social History   Socioeconomic History  . Marital status: Married    Spouse name: None  . Number of children: None  . Years of education: None  . Highest education level: None  Social Needs  . Financial resource strain: None  . Food insecurity - worry: None  . Food insecurity - inability: None  . Transportation needs - medical: None  . Transportation needs - non-medical: None  Occupational History  . None  Tobacco Use  . Smoking status: Never Smoker  . Smokeless tobacco: Never  Used  Substance and Sexual Activity  . Alcohol use: No  . Drug use: No  . Sexual activity: None  Other Topics Concern  . None  Social History Narrative  . None     Family History:  The patient's family history includes Cancer in his mother; Diabetes in his brother; Heart attack in his father.   ROS:   Please see the history of present illness.    ROS All other systems reviewed and are negative.   PHYSICAL EXAM:   VS:  BP 118/62   Pulse 60   Ht 5\' 7"  (1.702 m)   Wt 216 lb (98 kg)   BMI 33.83 kg/m     General: Alert, oriented x3, no distress, moderately obese. He looks pale and a little sleepy Head: no evidence of trauma, PERRL, EOMI, no exophtalmos or lid lag, no myxedema, no xanthelasma; normal ears, nose and oropharynx Neck: normal jugular venous pulsations and no hepatojugular reflux; brisk carotid pulses without delay and no carotid bruits Chest: clear to auscultation, no signs of consolidation by percussion or palpation, normal fremitus, symmetrical and full respiratory excursions Cardiovascular: normal position and quality of the apical impulse, regular rhythm, normal first and paradoxically split second heart sounds, no diastolic murmurs, rubs or gallops, 2/6 holosystolic murmur at the left lower sternal border Abdomen: no tenderness or distention, no masses by palpation, no abnormal pulsatility or arterial bruits, normal bowel sounds, no hepatosplenomegaly Extremities: no clubbing, cyanosis; 1+ symmetrical ankle and pretibial edema; 2+ radial, ulnar and brachial pulses bilaterally; 2+ right femoral, posterior tibial and dorsalis pedis pulses; 2+ left femoral, posterior tibial and dorsalis pedis pulses; no subclavian or femoral bruits Neurological: grossly nonfocal Psych: Normal mood and affect    Wt Readings from Last 3 Encounters:  08/23/17 216 lb (98 kg)  04/07/17 216 lb (98 kg)  04/05/17 223 lb (101.2 kg)      Studies/Labs Reviewed:   EKG:  EKG is ordered  today.  It shows atrial fibrillation with 100% ventricular pacing Recent Labs: 11/03/2016: TSH 2.991 11/09/2016: ALT 129 11/10/2016: Hemoglobin 8.6; Platelets 160 03/09/2017: BNP 395.1; BUN 35; Creatinine, Ser 1.44; Magnesium 2.2; Potassium 4.2; Sodium 142   11/25/2016, [facility] Creatinine 1.0, potassium 4.1, hemoglobin 10.6  ASSESSMENT:    1. Atrial fibrillation, persistent, slow VR   2. Shortness of breath  3. Chronic diastolic heart failure ([place])   4. Pacemaker   5. Current use of long term anticoagulation   6. PAH (pulmonary artery hypertension) ([place])   7. Obstructive sleep apnea syndrome      PLAN:  In order of problems listed above:  1. AFib: Slow ventricular rates consistent with significant AV node disease. Roughly the same percentage of ventricular pacing around 70%. Avoid negative chronotropic agents. CHADSVasc at least 3 (age 65, CHF), high embolic risk on appropriate anticoagulation. He has had a couple of falls during the last 12 months. To keep in mind the risk of serious head injury, but for the time being I think the benefit of anticoagulation still outweighs the risk. 2. CHF: Has not had any heart failure exacerbation since April 2018 and has not required diuretic dose adjustment. He is reporting worsening shortness of breath without overt evidence of severe hypervolemia. I'm not sure whether this is due to worsening pulmonary hypertension or left heart failure. Will repeat an echocardiogram. He seems to have predominantly right heart failure, in turn probably related to pulmonary hypertension, possibly due to insufficiently treated obstructive sleep apnea and moderate to severe tricuspid regurgitation. Hard to say to what degree the presence of the pacemaker lead is causing the tricuspid insufficiency and right heart failure. 3. PPM normal device function. Plan a remote download in 3 months. 4. Eliquis: Asked him to promptly report any falls into urgently seek medical  attention for any head injuries 5. PAH: The most likely cause of pulmonary hypertension is obesity and insufficiently treated obstructive sleep apnea. Probably some degree of diastolic left heart failure, but it's hard to establish this in the setting of atrial fibrillation.. 6. OSA:  Continues to have daytime hypersomnolence including falling off a chair on one occasion. Try to see if there is any way to improve sleep apnea therapy after discussing with Dr. Claiborne Billings. Last August we had planned for him to have a repeat sleep study after he recovered from his injury. There is a note in the computer from 04/04/2017 from [facility], talking about a plan to reschedule a sleep study in January. We'll try to get this organized. 7. Dyspnea and fatigue are both prominent complaints. In addition to echocardiogram will recheck his hemoglobin. At least by physical exam he still appears to be anemic.     Medication Adjustments/Labs and Tests Ordered: Current medicines are reviewed at length with the patient today.  Concerns regarding medicines are outlined above.  Medication changes, Labs and Tests ordered today are listed in the Patient Instructions below. Patient Instructions  Dr [name] recommends that you continue on your current medications as directed. Please refer to the Current Medication list given to you today.  Your physician has requested that you have an echocardiogram. Echocardiography is a painless test that uses sound waves to create images of your heart. It provides your doctor with information about the size and shape of your heart and how well your heart's chambers and valves are working. This procedure takes approximately one hour. There are no restrictions for this procedure. >>This will be performed at our [facility] location - [address], [address]  Remote monitoring is used to monitor your Pacemaker or ICD from home. This monitoring reduces the number of office visits required to  check your device to one time per year. It allows [place] to keep an eye on the functioning of your device to ensure it is working properly. You are scheduled for  a device check from home on Wednesday, January 30th, 2019. You may send your transmission at any time that day. If you have a wireless device, the transmission will be sent automatically. After your physician reviews your transmission, you will receive a notification with your next transmission date.  Your physician recommends that you schedule a follow-up appointment with Dr Claiborne Billings - FIRST AVAILABLE sleep clinic appointment.  Dr [name] recommends that you schedule a follow-up appointment in 12 months with a pacemaker check. You will receive a reminder letter in the mail two months in advance. If you don't receive a letter, please call our office to schedule the follow-up appointment.  If you need a refill on your cardiac medications before your next appointment, please call your pharmacy.      Signed, [name], MD  08/23/2017 6:29 PM    [address], [place], [place]  [address] Phone: [phone]; Fax: [phone]

## 2017-08-23 NOTE — Patient Instructions (Addendum)
Dr [name] recommends that you continue on your current medications as directed. Please refer to the Current Medication list given to you today.  Your physician has requested that you have an echocardiogram. Echocardiography is a painless test that uses sound waves to create images of your heart. It provides your doctor with information about the size and shape of your heart and how well your heart's chambers and valves are working. This procedure takes approximately one hour. There are no restrictions for this procedure. >>This will be performed at our [facility] location - [address], [address]  Remote monitoring is used to monitor your Pacemaker or ICD from home. This monitoring reduces the number of office visits required to check your device to one time per year. It allows [place] to keep an eye on the functioning of your device to ensure it is working properly. You are scheduled for a device check from home on Wednesday, January 30th, 2019. You may send your transmission at any time that day. If you have a wireless device, the transmission will be sent automatically. After your physician reviews your transmission, you will receive a notification with your next transmission date.  Your physician recommends that you schedule a follow-up appointment with Dr Claiborne Billings - FIRST AVAILABLE sleep clinic appointment.  Dr [name] recommends that you schedule a follow-up appointment in 12 months with a pacemaker check. You will receive a reminder letter in the mail two months in advance. If you don't receive a letter, please call our office to schedule the follow-up appointment.  If you need a refill on your cardiac medications before your next appointment, please call your pharmacy.

## 2017-08-31 ENCOUNTER — Other Ambulatory Visit: Payer: Self-pay | Admitting: *Deleted

## 2017-08-31 DIAGNOSIS — G4733 Obstructive sleep apnea (adult) (pediatric): Secondary | ICD-10-CM

## 2017-08-31 DIAGNOSIS — I482 Chronic atrial fibrillation, unspecified: Secondary | ICD-10-CM

## 2017-08-31 DIAGNOSIS — I5032 Chronic diastolic (congestive) heart failure: Secondary | ICD-10-CM

## 2017-08-31 DIAGNOSIS — I272 Pulmonary hypertension, unspecified: Secondary | ICD-10-CM

## 2017-08-31 DIAGNOSIS — I4819 Other persistent atrial fibrillation: Secondary | ICD-10-CM

## 2017-08-31 NOTE — Progress Notes (Signed)
Thank you MCr 

## 2017-09-07 ENCOUNTER — Ambulatory Visit (INDEPENDENT_AMBULATORY_CARE_PROVIDER_SITE_OTHER): Payer: Medicare Other | Admitting: *Deleted

## 2017-09-07 ENCOUNTER — Telehealth: Payer: Self-pay | Admitting: Cardiology

## 2017-09-07 DIAGNOSIS — I4819 Other persistent atrial fibrillation: Secondary | ICD-10-CM

## 2017-09-07 DIAGNOSIS — I481 Persistent atrial fibrillation: Secondary | ICD-10-CM | POA: Diagnosis not present

## 2017-09-07 NOTE — Telephone Encounter (Signed)
LMOVM reminding pt to send remote transmission.   

## 2017-09-09 ENCOUNTER — Encounter: Payer: Self-pay | Admitting: Cardiology

## 2017-09-09 ENCOUNTER — Other Ambulatory Visit (HOSPITAL_COMMUNITY): Payer: Self-pay

## 2017-09-09 NOTE — Progress Notes (Signed)
Remote pacemaker transmission.   

## 2017-09-12 DIAGNOSIS — I8311 Varicose veins of right lower extremity with inflammation: Secondary | ICD-10-CM | POA: Diagnosis not present

## 2017-09-12 DIAGNOSIS — I8312 Varicose veins of left lower extremity with inflammation: Secondary | ICD-10-CM | POA: Diagnosis not present

## 2017-09-12 DIAGNOSIS — I83892 Varicose veins of left lower extremities with other complications: Secondary | ICD-10-CM | POA: Diagnosis not present

## 2017-09-14 ENCOUNTER — Other Ambulatory Visit (HOSPITAL_COMMUNITY): Payer: Self-pay

## 2017-09-14 DIAGNOSIS — I8312 Varicose veins of left lower extremity with inflammation: Secondary | ICD-10-CM | POA: Diagnosis not present

## 2017-09-14 DIAGNOSIS — I83892 Varicose veins of left lower extremities with other complications: Secondary | ICD-10-CM | POA: Diagnosis not present

## 2017-09-19 LAB — CUP PACEART INCLINIC DEVICE CHECK
Date Time Interrogation Session: 20190211172637
Implantable Lead Location: 753860
Implantable Lead Model: 4137
Implantable Lead Serial Number: 29020819
Lead Channel Setting Pacing Amplitude: 1.6 V
Lead Channel Setting Pacing Pulse Width: 0.4 ms
Lead Channel Setting Sensing Sensitivity: 2.5 mV
MDC IDC LEAD IMPLANT DT: 20130305
MDC IDC PG IMPLANT DT: 20130305
MDC IDC PG SERIAL: 113290

## 2017-09-21 ENCOUNTER — Ambulatory Visit (HOSPITAL_COMMUNITY): Payer: Medicare Other | Attending: Cardiovascular Disease

## 2017-09-21 ENCOUNTER — Other Ambulatory Visit: Payer: Self-pay

## 2017-09-21 DIAGNOSIS — I119 Hypertensive heart disease without heart failure: Secondary | ICD-10-CM | POA: Diagnosis not present

## 2017-09-21 DIAGNOSIS — I272 Pulmonary hypertension, unspecified: Secondary | ICD-10-CM | POA: Diagnosis not present

## 2017-09-21 DIAGNOSIS — I252 Old myocardial infarction: Secondary | ICD-10-CM | POA: Diagnosis not present

## 2017-09-21 DIAGNOSIS — R0602 Shortness of breath: Secondary | ICD-10-CM | POA: Insufficient documentation

## 2017-09-21 DIAGNOSIS — I081 Rheumatic disorders of both mitral and tricuspid valves: Secondary | ICD-10-CM | POA: Diagnosis not present

## 2017-09-21 DIAGNOSIS — E785 Hyperlipidemia, unspecified: Secondary | ICD-10-CM | POA: Diagnosis not present

## 2017-09-21 DIAGNOSIS — Z950 Presence of cardiac pacemaker: Secondary | ICD-10-CM | POA: Diagnosis not present

## 2017-09-21 DIAGNOSIS — G4733 Obstructive sleep apnea (adult) (pediatric): Secondary | ICD-10-CM | POA: Diagnosis not present

## 2017-09-21 DIAGNOSIS — I4891 Unspecified atrial fibrillation: Secondary | ICD-10-CM | POA: Insufficient documentation

## 2017-09-21 MED ORDER — PERFLUTREN LIPID MICROSPHERE
1.0000 mL | INTRAVENOUS | Status: AC | PRN
  Administered 2017-09-21: 2 mL via INTRAVENOUS

## 2017-09-22 ENCOUNTER — Other Ambulatory Visit (HOSPITAL_COMMUNITY): Payer: Self-pay

## 2017-09-26 ENCOUNTER — Encounter (HOSPITAL_COMMUNITY): Payer: Self-pay | Admitting: Emergency Medicine

## 2017-09-26 ENCOUNTER — Emergency Department (HOSPITAL_COMMUNITY): Payer: Medicare Other

## 2017-09-26 ENCOUNTER — Other Ambulatory Visit: Payer: Self-pay

## 2017-09-26 ENCOUNTER — Inpatient Hospital Stay (HOSPITAL_COMMUNITY)
Admission: EM | Admit: 2017-09-26 | Discharge: 2017-09-27 | DRG: 291 | Disposition: A | Discharge status: Home Health | Payer: Medicare Other | Attending: Internal Medicine | Admitting: Internal Medicine

## 2017-09-26 DIAGNOSIS — I5023 Acute on chronic systolic (congestive) heart failure: Secondary | ICD-10-CM | POA: Diagnosis not present

## 2017-09-26 DIAGNOSIS — I081 Rheumatic disorders of both mitral and tricuspid valves: Secondary | ICD-10-CM | POA: Diagnosis present

## 2017-09-26 DIAGNOSIS — R0602 Shortness of breath: Secondary | ICD-10-CM

## 2017-09-26 DIAGNOSIS — I482 Chronic atrial fibrillation, unspecified: Secondary | ICD-10-CM | POA: Diagnosis present

## 2017-09-26 DIAGNOSIS — N5089 Other specified disorders of the male genital organs: Secondary | ICD-10-CM

## 2017-09-26 DIAGNOSIS — Z885 Allergy status to narcotic agent status: Secondary | ICD-10-CM | POA: Diagnosis not present

## 2017-09-26 DIAGNOSIS — J9621 Acute and chronic respiratory failure with hypoxia: Secondary | ICD-10-CM | POA: Diagnosis present

## 2017-09-26 DIAGNOSIS — I2721 Secondary pulmonary arterial hypertension: Secondary | ICD-10-CM | POA: Diagnosis present

## 2017-09-26 DIAGNOSIS — G9340 Encephalopathy, unspecified: Secondary | ICD-10-CM | POA: Diagnosis present

## 2017-09-26 DIAGNOSIS — Z7901 Long term (current) use of anticoagulants: Secondary | ICD-10-CM

## 2017-09-26 DIAGNOSIS — N183 Chronic kidney disease, stage 3 unspecified: Secondary | ICD-10-CM | POA: Diagnosis present

## 2017-09-26 DIAGNOSIS — I110 Hypertensive heart disease with heart failure: Secondary | ICD-10-CM | POA: Diagnosis not present

## 2017-09-26 DIAGNOSIS — I428 Other cardiomyopathies: Secondary | ICD-10-CM

## 2017-09-26 DIAGNOSIS — M109 Gout, unspecified: Secondary | ICD-10-CM | POA: Diagnosis present

## 2017-09-26 DIAGNOSIS — G4733 Obstructive sleep apnea (adult) (pediatric): Secondary | ICD-10-CM

## 2017-09-26 DIAGNOSIS — E785 Hyperlipidemia, unspecified: Secondary | ICD-10-CM | POA: Diagnosis present

## 2017-09-26 DIAGNOSIS — R0902 Hypoxemia: Secondary | ICD-10-CM

## 2017-09-26 DIAGNOSIS — D61818 Other pancytopenia: Secondary | ICD-10-CM | POA: Diagnosis present

## 2017-09-26 DIAGNOSIS — G8929 Other chronic pain: Secondary | ICD-10-CM | POA: Diagnosis present

## 2017-09-26 DIAGNOSIS — I429 Cardiomyopathy, unspecified: Secondary | ICD-10-CM | POA: Diagnosis present

## 2017-09-26 DIAGNOSIS — I252 Old myocardial infarction: Secondary | ICD-10-CM

## 2017-09-26 DIAGNOSIS — Z79899 Other long term (current) drug therapy: Secondary | ICD-10-CM

## 2017-09-26 DIAGNOSIS — I2510 Atherosclerotic heart disease of native coronary artery without angina pectoris: Secondary | ICD-10-CM | POA: Diagnosis present

## 2017-09-26 DIAGNOSIS — Z950 Presence of cardiac pacemaker: Secondary | ICD-10-CM | POA: Diagnosis not present

## 2017-09-26 DIAGNOSIS — Z9861 Coronary angioplasty status: Secondary | ICD-10-CM | POA: Diagnosis not present

## 2017-09-26 DIAGNOSIS — Z9989 Dependence on other enabling machines and devices: Secondary | ICD-10-CM | POA: Diagnosis not present

## 2017-09-26 DIAGNOSIS — J9601 Acute respiratory failure with hypoxia: Secondary | ICD-10-CM | POA: Diagnosis not present

## 2017-09-26 DIAGNOSIS — I5022 Chronic systolic (congestive) heart failure: Secondary | ICD-10-CM | POA: Diagnosis present

## 2017-09-26 LAB — CUP PACEART REMOTE DEVICE CHECK
Date Time Interrogation Session: 20190201084100
Implantable Lead Serial Number: 29020819
Implantable Pulse Generator Implant Date: 20130305
Lead Channel Impedance Value: 638 Ohm
Lead Channel Pacing Threshold Amplitude: 1 V
Lead Channel Pacing Threshold Pulse Width: 0.4 ms
Lead Channel Setting Pacing Amplitude: 3.5 V
MDC IDC LEAD IMPLANT DT: 20130305
MDC IDC LEAD LOCATION: 753860
MDC IDC MSMT BATTERY REMAINING LONGEVITY: 60 mo
MDC IDC MSMT BATTERY REMAINING PERCENTAGE: 72 %
MDC IDC SET LEADCHNL RV PACING PULSEWIDTH: 0.4 ms
MDC IDC SET LEADCHNL RV SENSING SENSITIVITY: 2.5 mV
MDC IDC STAT BRADY RV PERCENT PACED: 69 %
Pulse Gen Serial Number: 113290

## 2017-09-26 LAB — RETICULOCYTES
RBC.: 2.34 MIL/uL — ABNORMAL LOW (ref 4.22–5.81)
Retic Count, Absolute: 77.2 10*3/uL (ref 19.0–186.0)
Retic Ct Pct: 3.3 % — ABNORMAL HIGH (ref 0.4–3.1)

## 2017-09-26 LAB — BASIC METABOLIC PANEL
ANION GAP: 11 (ref 5–15)
BUN: 71 mg/dL — ABNORMAL HIGH (ref 6–20)
CALCIUM: 8.7 mg/dL — AB (ref 8.9–10.3)
CO2: 27 mmol/L (ref 22–32)
Chloride: 102 mmol/L (ref 101–111)
Creatinine, Ser: 1.79 mg/dL — ABNORMAL HIGH (ref 0.61–1.24)
GFR, EST AFRICAN AMERICAN: 39 mL/min — AB (ref >60)
GFR, EST NON AFRICAN AMERICAN: 33 mL/min — AB (ref >60)
Glucose, Bld: 131 mg/dL — ABNORMAL HIGH (ref 65–99)
Potassium: 4.2 mmol/L (ref 3.5–5.1)
Sodium: 140 mmol/L (ref 135–145)

## 2017-09-26 LAB — TYPE AND SCREEN
ABO/RH(D): A NEG
Antibody Screen: NEGATIVE

## 2017-09-26 LAB — CBC
HCT: 26.6 % — ABNORMAL LOW (ref 39.0–52.0)
HEMOGLOBIN: 8.6 g/dL — AB (ref 13.0–17.0)
MCH: 35 pg — ABNORMAL HIGH (ref 26.0–34.0)
MCHC: 32.3 g/dL (ref 30.0–36.0)
MCV: 108.1 fL — ABNORMAL HIGH (ref 78.0–100.0)
Platelets: 128 10*3/uL — ABNORMAL LOW (ref 150–400)
RBC: 2.46 MIL/uL — AB (ref 4.22–5.81)
RDW: 19.3 % — ABNORMAL HIGH (ref 11.5–15.5)
WBC: 3.9 10*3/uL — AB (ref 4.0–10.5)

## 2017-09-26 LAB — HEPATIC FUNCTION PANEL
ALT: 34 U/L (ref 17–63)
AST: 60 U/L — ABNORMAL HIGH (ref 15–41)
Albumin: 3.3 g/dL — ABNORMAL LOW (ref 3.5–5.0)
Alkaline Phosphatase: 84 U/L (ref 38–126)
BILIRUBIN DIRECT: 0.5 mg/dL (ref 0.1–0.5)
BILIRUBIN INDIRECT: 0.7 mg/dL (ref 0.3–0.9)
Total Bilirubin: 1.2 mg/dL (ref 0.3–1.2)
Total Protein: 6.6 g/dL (ref 6.5–8.1)

## 2017-09-26 LAB — I-STAT TROPONIN, ED: TROPONIN I, POC: 0.04 ng/mL (ref 0.00–0.08)

## 2017-09-26 LAB — IRON AND TIBC
IRON: 57 ug/dL (ref 45–182)
Saturation Ratios: 15 % — ABNORMAL LOW (ref 17.9–39.5)
TIBC: 370 ug/dL (ref 250–450)
UIBC: 313 ug/dL

## 2017-09-26 LAB — VITAMIN B12: Vitamin B-12: 582 pg/mL (ref 180–914)

## 2017-09-26 LAB — BRAIN NATRIURETIC PEPTIDE: B NATRIURETIC PEPTIDE 5: 825.6 pg/mL — AB (ref 0.0–100.0)

## 2017-09-26 LAB — PROTIME-INR
INR: 1.57
PROTHROMBIN TIME: 18.7 s — AB (ref 11.4–15.2)

## 2017-09-26 LAB — FERRITIN: FERRITIN: 51 ng/mL (ref 24–336)

## 2017-09-26 LAB — FOLATE: Folate: 27.2 ng/mL (ref >5.9)

## 2017-09-26 MED ORDER — MIRTAZAPINE 15 MG PO TABS
15.0000 mg | ORAL_TABLET | Freq: Every day | ORAL | Status: DC
  Administered 2017-09-26: 15 mg via ORAL
  Filled 2017-09-26: qty 1

## 2017-09-26 MED ORDER — ACETAMINOPHEN 325 MG PO TABS: 650.0000 mg | ORAL_TABLET | ORAL | Status: DC | PRN

## 2017-09-26 MED ORDER — SODIUM CHLORIDE 0.9% FLUSH: 3.0000 mL | INTRAVENOUS | Status: DC | PRN

## 2017-09-26 MED ORDER — FUROSEMIDE 10 MG/ML IJ SOLN
60.0000 mg | Freq: Two times a day (BID) | INTRAMUSCULAR | Status: DC
  Administered 2017-09-26 – 2017-09-27 (×3): 60 mg via INTRAVENOUS
  Filled 2017-09-26 (×3): qty 6

## 2017-09-26 MED ORDER — TAMSULOSIN HCL 0.4 MG PO CAPS
0.4000 mg | ORAL_CAPSULE | Freq: Every day | ORAL | Status: DC
Start: 2017-09-26 — End: 2017-09-27
  Administered 2017-09-26 – 2017-09-27 (×2): 0.4 mg via ORAL
  Filled 2017-09-26 (×2): qty 1

## 2017-09-26 MED ORDER — APIXABAN 2.5 MG PO TABS
2.5000 mg | ORAL_TABLET | Freq: Two times a day (BID) | ORAL | Status: DC
  Administered 2017-09-26 – 2017-09-27 (×3): 2.5 mg via ORAL
  Filled 2017-09-26 (×3): qty 1

## 2017-09-26 MED ORDER — ALLOPURINOL 100 MG PO TABS
200.0000 mg | ORAL_TABLET | Freq: Every day | ORAL | Status: DC
  Administered 2017-09-26 – 2017-09-27 (×2): 200 mg via ORAL
  Filled 2017-09-26 (×3): qty 2

## 2017-09-26 MED ORDER — DUTASTERIDE 0.5 MG PO CAPS
0.5000 mg | ORAL_CAPSULE | Freq: Every day | ORAL | Status: DC
  Administered 2017-09-26 – 2017-09-27 (×2): 0.5 mg via ORAL
  Filled 2017-09-26 (×3): qty 1

## 2017-09-26 MED ORDER — DOCUSATE SODIUM 100 MG PO CAPS
200.0000 mg | ORAL_CAPSULE | Freq: Every day | ORAL | Status: DC
  Administered 2017-09-26: 200 mg via ORAL
  Filled 2017-09-26: qty 2

## 2017-09-26 MED ORDER — SODIUM CHLORIDE 0.9% FLUSH
3.0000 mL | Freq: Two times a day (BID) | INTRAVENOUS | Status: DC
  Administered 2017-09-26 – 2017-09-27 (×2): 3 mL via INTRAVENOUS

## 2017-09-26 MED ORDER — FUROSEMIDE 10 MG/ML IJ SOLN
120.0000 mg | Freq: Once | INTRAVENOUS | Status: AC
  Administered 2017-09-26: 120 mg via INTRAVENOUS
  Filled 2017-09-26: qty 12

## 2017-09-26 MED ORDER — MORPHINE SULFATE 15 MG PO TABS: 15.0000 mg | ORAL_TABLET | ORAL | Status: DC | PRN

## 2017-09-26 MED ORDER — HYDROXYZINE HCL 25 MG PO TABS
25.0000 mg | ORAL_TABLET | Freq: Four times a day (QID) | ORAL | Status: DC | PRN
  Administered 2017-09-26 (×2): 25 mg via ORAL
  Filled 2017-09-26 (×2): qty 1

## 2017-09-26 MED ORDER — OMEGA-3-ACID ETHYL ESTERS 1 G PO CAPS
1000.0000 mg | ORAL_CAPSULE | Freq: Two times a day (BID) | ORAL | Status: DC
  Administered 2017-09-26 – 2017-09-27 (×3): 1000 mg via ORAL
  Filled 2017-09-26 (×4): qty 1

## 2017-09-26 MED ORDER — LORATADINE 10 MG PO TABS
10.0000 mg | ORAL_TABLET | Freq: Every day | ORAL | Status: DC
  Administered 2017-09-26 – 2017-09-27 (×2): 10 mg via ORAL
  Filled 2017-09-26 (×2): qty 1

## 2017-09-26 MED ORDER — FUROSEMIDE 10 MG/ML IJ SOLN: 40.0000 mg | Freq: Three times a day (TID) | INTRAMUSCULAR | Status: DC

## 2017-09-26 MED ORDER — SODIUM CHLORIDE 0.9 % IV SOLN: 250.0000 mL | INTRAVENOUS | Status: DC | PRN

## 2017-09-26 MED ORDER — POTASSIUM CHLORIDE CRYS ER 10 MEQ PO TBCR
10.0000 meq | EXTENDED_RELEASE_TABLET | Freq: Every day | ORAL | Status: DC
  Administered 2017-09-26 – 2017-09-27 (×2): 10 meq via ORAL
  Filled 2017-09-26 (×2): qty 1

## 2017-09-26 MED ORDER — PROSIGHT PO TABS
1.0000 | ORAL_TABLET | Freq: Every day | ORAL | Status: DC
Start: 2017-09-26 — End: 2017-09-27
  Administered 2017-09-26 – 2017-09-27 (×2): 1 via ORAL
  Filled 2017-09-26 (×3): qty 1

## 2017-09-26 MED ORDER — PANTOPRAZOLE SODIUM 40 MG PO TBEC
40.0000 mg | DELAYED_RELEASE_TABLET | Freq: Every day | ORAL | Status: DC
  Administered 2017-09-26 – 2017-09-27 (×2): 40 mg via ORAL
  Filled 2017-09-26 (×2): qty 1

## 2017-09-26 MED ORDER — ONDANSETRON HCL 4 MG/2ML IJ SOLN: 4.0000 mg | Freq: Four times a day (QID) | INTRAMUSCULAR | Status: DC | PRN

## 2017-09-26 MED ORDER — ESCITALOPRAM OXALATE 10 MG PO TABS
5.0000 mg | ORAL_TABLET | Freq: Every day | ORAL | Status: DC
  Administered 2017-09-26: 5 mg via ORAL
  Filled 2017-09-26: qty 1

## 2017-09-26 MED ORDER — TRAMADOL HCL 50 MG PO TABS
50.0000 mg | ORAL_TABLET | Freq: Four times a day (QID) | ORAL | Status: DC | PRN
  Administered 2017-09-26: 50 mg via ORAL
  Filled 2017-09-26: qty 1

## 2017-09-26 MED ORDER — ROPINIROLE HCL 1 MG PO TABS
3.0000 mg | ORAL_TABLET | Freq: Every day | ORAL | Status: DC
  Administered 2017-09-26 – 2017-09-27 (×2): 3 mg via ORAL
  Filled 2017-09-26 (×4): qty 3

## 2017-09-26 NOTE — H&P (Signed)
History and Physical    DANYL DEEMS GBT:[id number] DOB: [birth year] DOA: 09/26/2017  PCP: [name], MD   Patient coming from: Home  Chief Complaint: SOB, increased swelling   HPI: [name] is a 84 y.o. male with medical history significant for coronary artery disease, chronic systolic CHF, obstructive sleep apnea, chronic atrial fibrillation, and chronic kidney disease stage III, now presenting to the emergency department for evaluation of shortness of breath and increased edema.  Patient reports that he had been developing increased exertional dyspnea insidiously over the past couple months, but notes that this has worsened significantly in recent days with dyspnea at rest now.  He also complains of increasing edema, particularly involving the scrotum which is causing discomfort.  He denies chest pain, fevers, chills, or significant cough.  Just had an echo performed last week with EF documented as 25-30%, but after review by his primary cardiologist it was felt that EF is more likely 40-45% as it had been previously.  Patient's daughter is at the bedside and reports that the patient has now been following fluid restriction or salt restriction, and takes his medications at all at our 80, raising concern with her that he misses doses or takes too frequent dosing sometimes.  ED Course: Upon arrival to the ED, patient is found to be afebrile, saturating mid 80s on room air, and with vitals otherwise normal.  EKG features a paced rhythm and chest x-ray is notable for cardiomegaly and pulmonary vascular congestion without focal infiltrate.  Chemistry panel is notable for a BUN of 71 and creatinine of 1.79, similar to his priors.  CBC features a pancytopenia with WBC 3900, hemoglobin stable at 8.6, and platelets slightly low at 128,000.  MCV has increased to 108.  Troponin is within normal limits.  Cardiology was consulted by the ED physician and recommended medical admission.  Patient was  treated with 120 mg of IV Lasix.  He was started on supplemental oxygen.  He remains hemodynamically stable, is not in acute distress, and will be admitted to the telemetry unit for ongoing evaluation and management of acute on chronic systolic CHF.  Review of Systems:  All other systems reviewed and apart from HPI, are negative.  Past Medical History:  Diagnosis Date  . Coronary artery disease   . Diverticulitis   . Gout   . Hyperlipidemia   . MI (myocardial infarction) ([place])   . Nonischemic cardiomyopathy ([facility])    mild  . OSA on CPAP   . Permanent atrial fibrillation ([place])   . Pulmonary hypertension ([place])     Past Surgical History:  Procedure Laterality Date  . CARDIAC CATHETERIZATION  11/07/2008   nonischemic cardiomyopathy,pulmonary hypertension  . COLOSTOMY    . COLOSTOMY REVERSAL    . CORONARY ANGIOPLASTY  06/01/1999   successful to ostium of the first diagonal  . ESOPHAGOGASTRODUODENOSCOPY N/A 08/22/2013   Procedure: ESOPHAGOGASTRODUODENOSCOPY (EGD);  Surgeon: [name], MD;  Location: [facility] ENDOSCOPY;  Service: Endoscopy;  Laterality: N/A;  h/p in file cabinet, jackie  . ESOPHAGOGASTRODUODENOSCOPY N/A 11/05/2014   Procedure: ESOPHAGOGASTRODUODENOSCOPY (EGD);  Surgeon: Clarene Essex, MD;  Location: [facility] ENDOSCOPY;  Service: Endoscopy;  Laterality: N/A;  . ESOPHAGOGASTRODUODENOSCOPY N/A 11/08/2016   Procedure: ESOPHAGOGASTRODUODENOSCOPY (EGD);  Surgeon: [name], MD;  Location: [facility] ENDOSCOPY;  Service: Endoscopy;  Laterality: N/A;  . HOT HEMOSTASIS N/A 11/05/2014   Procedure: HOT HEMOSTASIS (ARGON PLASMA COAGULATION/BICAP);  Surgeon: Clarene Essex, MD;  Location: [facility] ENDOSCOPY;  Service: Endoscopy;  Laterality: N/A;  . NM MYOCAR PERF WALL MOTION  11/24/2007   normal  . PERMANENT PACEMAKER INSERTION  10/04/2012   [employer]  . PERMANENT PACEMAKER INSERTION N/A 10/12/2011   Procedure: PERMANENT PACEMAKER INSERTION;  Surgeon: [name], MD;  Location: [facility] CATH LAB;  Service:  Cardiovascular;  Laterality: N/A;  . REPLACEMENT TOTAL KNEE    . SAVORY DILATION N/A 08/22/2013   Procedure: SAVORY DILATION;  Surgeon: [name], MD;  Location: [facility] ENDOSCOPY;  Service: Endoscopy;  Laterality: N/A;  . SHOULDER SURGERY    . US ECHOCARDIOGRAPHY  02/01/2011   LA is mod-severely dilated,AOV & root sclerotic,ca+ AOV leaflets     reports that  has never smoked. he has never used smokeless tobacco. He reports that he does not drink alcohol or use drugs.  Allergies  Allergen Reactions  . Hydrocodone-Acetaminophen Itching  . Vicodin [Hydrocodone-Acetaminophen] Nausea And Vomiting  . Hydrocodone Nausea Only    Family History  Problem Relation Age of Onset  . Cancer Mother   . Heart attack Father   . Diabetes Brother      Prior to Admission medications   Medication Sig Start Date End Date Taking? Authorizing Provider  acetaminophen (TYLENOL) 650 MG CR tablet Take 650 mg by mouth every 8 (eight) hours as needed for pain.    [provider]  allopurinol (ZYLOPRIM) 100 MG tablet Take 2 tablets (200 mg total) by mouth daily. 11/09/16   Theodis Blaze, MD  COLCRYS 0.6 MG tablet Take 0.6 mg by mouth as needed (flareups).  03/01/13   [provider]  Docusate Calcium (STOOL SOFTENER PO) Take 2 tablets by mouth at bedtime.     [provider]  dutasteride (AVODART) 0.5 MG capsule Take 0.5 mg by mouth daily.    [provider]  ELIQUIS 5 MG TABS tablet Take 1 tablet by mouth 2 (two) times daily. 01/29/17   [provider]  escitalopram (LEXAPRO) 5 MG tablet Take 1 tablet (5 mg total) by mouth at bedtime. 11/09/16   Theodis Blaze, MD  furosemide (LASIX) 40 MG tablet Take 1 tablet (40 mg total) by mouth 2 (two) times daily. Take extra 40 mg midday x 4 days only 07/15/17 07/10/18  Croitoru, Mihai, MD  hydrOXYzine (ATARAX/VISTARIL) 25 MG tablet TAKE 1 TABLET BY MOUTH EVERY 6 HOURS AS NEEDED FOR ITCHING 07/07/17   Croitoru, Mihai, MD  loratadine  (CLARITIN) 10 MG tablet Take 10 mg by mouth daily.    [provider]  mirtazapine (REMERON) 15 MG tablet Take 15 mg by mouth at bedtime.    [provider]  morphine (MSIR) 15 MG tablet Take 1 tablet (15 mg total) by mouth every 4 (four) hours as needed for severe pain. 04/05/17   Deno Etienne, DO  Multiple Vitamins-Minerals (PRESERVISION AREDS 2) CAPS Take 1 capsule by mouth daily.    [provider]  nitroGLYCERIN (NITROSTAT) 0.4 MG SL tablet Place 0.4 mg under the tongue every 5 (five) minutes as needed. Chest pain    [provider]  Omega-3 Fatty Acids (OMEGA-3 PO) Take 1 capsule by mouth 2 (two) times daily.    [provider]  pantoprazole (PROTONIX) 40 MG tablet Take 1 tablet (40 mg total) by mouth daily. 11/10/16   Theodis Blaze, MD  potassium chloride (K-DUR) 10 MEQ tablet Take 1 tablet (10 mEq total) by mouth daily. 03/17/17   Croitoru, Mihai, MD  rOPINIRole (REQUIP) 3 MG tablet Take 1 tablet  by mouth daily. Take 1 tab daily 08/02/15   [provider]  Tamsulosin HCl (FLOMAX) 0.4 MG CAPS Take 0.4 mg by mouth daily after breakfast.    [provider]  traMADol (ULTRAM) 50 MG tablet Take 50 mg by mouth 4 (four) times daily. For pain     [provider]    Physical Exam: Vitals:   09/26/17 0122 09/26/17 0200 09/26/17 0208  BP: 117/90 125/66   Pulse: (!) 59 (!) 59   Resp: 20 18   Temp: 98.7 F (37.1 C)    TempSrc: Oral    SpO2: (!) 87% 97% 98%      Constitutional: NAD, calm Eyes: PERTLA, lids and conjunctivae normal ENMT: Mucous membranes are moist. Posterior pharynx clear of any exudate or lesions.   Neck: normal, supple, no masses, no thyromegaly Respiratory: Basilar rales. Normal respiratory effort. No accessory muscle use.  Cardiovascular: S1 & S2 heard, regular rate and rhythm. Bilateral LE edema to the hips. Scrotal edema . No significant JVD. Abdomen: No distension, no tenderness, no masses palpated.  Bowel sounds normal.  Musculoskeletal: no clubbing / cyanosis. No joint deformity upper and lower extremities. Normal muscle tone.  Skin: Scattered ecchymoses. Warm, dry, well-perfused. Neurologic: CN 2-12 grossly intact. Sensation intact. Strength 5/5 in all 4 limbs.  Psychiatric: Somnolent.  Easily roused and oriented x 3.      Labs on Admission: I have personally reviewed following labs and imaging studies  CBC: Recent Labs  Lab 09/26/17 0119  WBC 3.9*  HGB 8.6*  HCT 26.6*  MCV 108.1*  PLT 789*   Basic Metabolic Panel: Recent Labs  Lab 09/26/17 0119  NA 140  K 4.2  CL 102  CO2 27  GLUCOSE 131*  BUN 71*  CREATININE 1.79*  CALCIUM 8.7*   GFR: CrCl cannot be calculated (Unknown ideal weight.). Liver Function Tests: No results for input(s): AST, ALT, ALKPHOS, BILITOT, PROT, ALBUMIN in the last 168 hours. No results for input(s): LIPASE, AMYLASE in the last 168 hours. No results for input(s): AMMONIA in the last 168 hours. Coagulation Profile: No results for input(s): INR, PROTIME in the last 168 hours. Cardiac Enzymes: No results for input(s): CKTOTAL, CKMB, CKMBINDEX, TROPONINI in the last 168 hours. BNP (last 3 results) No results for input(s): PROBNP in the last 8760 hours. HbA1C: No results for input(s): HGBA1C in the last 72 hours. CBG: No results for input(s): GLUCAP in the last 168 hours. Lipid Profile: No results for input(s): CHOL, HDL, LDLCALC, TRIG, CHOLHDL, LDLDIRECT in the last 72 hours. Thyroid Function Tests: No results for input(s): TSH, T4TOTAL, FREET4, T3FREE, THYROIDAB in the last 72 hours. Anemia Panel: No results for input(s): VITAMINB12, FOLATE, FERRITIN, TIBC, IRON, RETICCTPCT in the last 72 hours. Urine analysis:    Component Value Date/Time   COLORURINE YELLOW 11/03/2016 1409   APPEARANCEUR HAZY (A) 11/03/2016 1409   LABSPEC 1.016 11/03/2016 1409   PHURINE 5.0 11/03/2016 1409   GLUCOSEU NEGATIVE 11/03/2016 1409   HGBUR NEGATIVE  11/03/2016 1409   BILIRUBINUR NEGATIVE 11/03/2016 1409   KETONESUR NEGATIVE 11/03/2016 1409   PROTEINUR 30 (A) 11/03/2016 1409   NITRITE NEGATIVE 11/03/2016 1409   LEUKOCYTESUR NEGATIVE 11/03/2016 1409   Sepsis Labs: @LABRCNTIP (procalcitonin:4,lacticidven:4) )No results found for this or any previous visit (from the past 240 hour(s)).   Radiological Exams on Admission: Dg Chest 2 View  Result Date: 09/26/2017 CLINICAL DATA:  Shortness of breath EXAM: CHEST  2 VIEW COMPARISON:  11/03/2016 FINDINGS: Left-sided pacing  device as before. Cardiomegaly with central vascular congestion. No pleural effusion or focal pulmonary opacity. Aortic atherosclerosis. No pneumothorax. Degenerative changes at the right shoulder with postsurgical change at the humeral head. IMPRESSION: 1. Cardiomegaly with vascular congestion 2. No focal pulmonary infiltrate is seen Electronically Signed   By: Donavan Foil M.D.   On: 09/26/2017 01:41    EKG: Independently reviewed. Ventricular-paced rhythm.   Assessment/Plan  1. Acute on chronic systolic CHF; acute hypoxic respiratory failure   - Presents with progressive SOB and swelling  - Wt and BNP pending, CXR with vascular congestion, exam with peripheral edema and basilar rales, no JVD - Had echo on 09/21/17 with EF documented to be 25-30%, but his personal cardiologist felt more likely 40-45%, as well as mild MR, moderate LAE, severe TR, and PA peak pressure 110 mm Hg  - Treated with 120 mg IV Lasix in ED  - Continue cardiac monitoring, SLIV, fluid-restrict diet, follow daily wt and I/O's, daily chem panel  - Diurese with Lasix 60 mg IV q12h, no ACE/ARB in light of renal insufficiency and beta-blocker precluded by bradycardia   2. Pancytopenia  - Hgb is stable at 8.6 with no bleeding  - WBC 3,900 and slightly lower than priors; no infectious s/s  - Platelets slightly low at 128k, previously wnl  - MCV has been elevated, now increased to 108  - Check anemia  panel, renally-dose Eliquis, type and screen    3. Atrial fibrillation  - Rate-controlled on admission  - CHADS-VASc at least 4 (age x2,CAD, CHF)  - Continue Eliquis, reduce to 2.5 BID given age and renal fxn   4. CKD stage III  - SCr is 1.79 on admission, close to apparent baseline  - Renally-dose medications, follow daily chem panel during diuresis    5. Chronic pain - Stable  - Continue home-regimen with prn MSIR and tramadol  - Continue bowel-regimen    6. OSA  - Continue CPAP     DVT prophylaxis: Eliquis  Code Status: Full  Family Communication: Daughter updated at bedside Disposition Plan: Admit to telemetry Consults called: None Admission status: Inpatient    Vianne Bulls, MD [facility] Pager [phone]  If 7PM-7AM, please contact night-coverage [email or web address] Password [email or web address]  09/26/2017, 2:50 AM

## 2017-09-26 NOTE — ED Notes (Signed)
Pt wanted to get up and go to the bathroom, this EMT explained to pt that I can put him on a bed pan due to pt being very unsteady on his feet. Pt said no he didn't want a bed pan that he would just wait. This EMT told pt I would talk to the nurse and she what she wanted to do, but this EMT does not feel comfortable getting pt out the bed being unsteady.

## 2017-09-26 NOTE — Plan of Care (Signed)
  Education: Ability to demonstrate management of disease process will improve 09/26/2017 1541 - Not Progressing by Imagene Gurney, RN

## 2017-09-26 NOTE — ED Triage Notes (Signed)
C/o increased sob over the past 2 months and scrotal swelling/pain x 2 days.

## 2017-09-26 NOTE — ED Notes (Signed)
Pt is sleeping and his oxygen saturation drops to 74% while on 2L [place]. Pt's daughter states that the patient wears CPAP while sleeping. Pt placed on a venti mask with 45% FiO2 and 10L to supplement while sleeping.

## 2017-09-26 NOTE — ED Notes (Signed)
Pt agreed to use the bedside.

## 2017-09-26 NOTE — ED Provider Notes (Signed)
[facility]ERG[facility]Y DEPARTMENT Provider Note   CSN: [id number] Arrival date & time: 09/26/17  0104     History   Chief Complaint Chief Complaint  Patient presents with  . Shortness of Breath  . Groin Swelling    HPI [name] is a 84 y.o. male.  HPI  This is an 84 year old male with a history of coronary artery disease, CHF, permanent atrial fibrillation who presents with shortness of breath and scrotal edema.  Patient reports 2-week history of worsening shortness of breath.  Denies fevers, cough, orthopnea.  Over the last 2-3 days has noticed increased scrotal edema.  Decreased urine output.  He does have a history of heart failure and is on 120 mg of Lasix daily.  Patient denies any chest pain.  No increased lower extremity edema.  She had noted to be hypoxic at 87% in triage.  Is not normally on home oxygen.  Of note, patient had an echocardiogram on September 21, 2017.  Official read with reduced EF; however, primary cardiologist feels that EF was likely stable but there was significant dyssynchrony.  Past Medical History:  Diagnosis Date  . Coronary artery disease   . Diverticulitis   . Gout   . Hyperlipidemia   . MI (myocardial infarction) ([place])   . Nonischemic cardiomyopathy (HCC)    mild  . OSA on CPAP   . Permanent atrial fibrillation ([place])   . Pulmonary hypertension [facility])     Patient Active Problem List   Diagnosis Date Noted  . Pancytopenia ([place]) 09/26/2017  . CKD (chronic kidney disease), stage III ([place]) 09/26/2017  . Chronic pain 09/26/2017  . Acute respiratory failure with hypoxia ([place]) 09/26/2017  . Right shoulder injury, initial encounter 04/08/2017  . Chronic atrial fibrillation ([place]) 11/23/2016  . Acute upper GI bleeding 11/23/2016  . Acute delirium 11/07/2016  . Acute confusion   . Acute hyperkalemia   . Secondary hypercoagulable state ([place])   . Transaminitis   . PAH (pulmonary artery hypertension) ([place])   . Acute  renal insufficiency 11/03/2016  . Acute on chronic systolic CHF (congestive heart failure) ([place]) 09/09/2015  . Poor short term memory 11/05/2013  . Fatigue 10/13/2011  . Dyspnea on exertion 10/13/2011  . Atrial fibrillation, persistent, slow VR 10/13/2011  . Pacemaker single chamber, [place], 2013 10/13/2011  . Restless leg syndrome 10/13/2011  . Chronic anticoagulation 10/13/2011  . Nonischemic cardiomyopathy - coronaries by angiography 2010 10/13/2011  . OSA on CPAP 10/13/2011  . Tremor, hereditary, benign 10/13/2011  . Depression 10/13/2011  . Pulmonary hypertension ([place]) 10/13/2011  . MRSA (methicillin resistant Staphylococcus aureus) carrier 10/13/2011  . BPH (benign prostatic hyperplasia) 10/13/2011    Past Surgical History:  Procedure Laterality Date  . CARDIAC CATHETERIZATION  11/07/2008   nonischemic cardiomyopathy,pulmonary hypertension  . COLOSTOMY    . COLOSTOMY REVERSAL    . CORONARY ANGIOPLASTY  06/01/1999   successful to ostium of the first diagonal  . ESOPHAGOGASTRODUODENOSCOPY N/A 08/22/2013   Procedure: ESOPHAGOGASTRODUODENOSCOPY (EGD);  Surgeon: [name], MD;  Location: [facility] ENDOSCOPY;  Service: Endoscopy;  Laterality: N/A;  h/p in file cabinet, jackie  . ESOPHAGOGASTRODUODENOSCOPY N/A 11/05/2014   Procedure: ESOPHAGOGASTRODUODENOSCOPY (EGD);  Surgeon: Clarene Essex, MD;  Location: [facility] ENDOSCOPY;  Service: Endoscopy;  Laterality: N/A;  . ESOPHAGOGASTRODUODENOSCOPY N/A 11/08/2016   Procedure: ESOPHAGOGASTRODUODENOSCOPY (EGD);  Surgeon: [name], MD;  Location: [facility] ENDOSCOPY;  Service: Endoscopy;  Laterality: N/A;  . HOT HEMOSTASIS N/A 11/05/2014   Procedure:  HOT HEMOSTASIS (ARGON PLASMA COAGULATION/BICAP);  Surgeon: Clarene Essex, MD;  Location: [facility] ENDOSCOPY;  Service: Endoscopy;  Laterality: N/A;  . NM MYOCAR PERF WALL MOTION  11/24/2007   normal  . PERMANENT PACEMAKER INSERTION  10/04/2012   [employer]  . PERMANENT PACEMAKER INSERTION N/A  10/12/2011   Procedure: PERMANENT PACEMAKER INSERTION;  Surgeon: [name], MD;  Location: [facility] CATH LAB;  Service: Cardiovascular;  Laterality: N/A;  . REPLACEMENT TOTAL KNEE    . SAVORY DILATION N/A 08/22/2013   Procedure: SAVORY DILATION;  Surgeon: [name], MD;  Location: [facility] ENDOSCOPY;  Service: Endoscopy;  Laterality: N/A;  . SHOULDER SURGERY    . US ECHOCARDIOGRAPHY  02/01/2011   LA is mod-severely dilated,AOV & root sclerotic,ca+ AOV leaflets       Home Medications    Prior to Admission medications   Medication Sig Start Date End Date Taking? Authorizing Provider  acetaminophen (TYLENOL) 650 MG CR tablet Take 650 mg by mouth every 8 (eight) hours as needed for pain.    [provider]  allopurinol (ZYLOPRIM) 100 MG tablet Take 2 tablets (200 mg total) by mouth daily. 11/09/16   Theodis Blaze, MD  COLCRYS 0.6 MG tablet Take 0.6 mg by mouth as needed (flareups).  03/01/13   [provider]  Docusate Calcium (STOOL SOFTENER PO) Take 2 tablets by mouth at bedtime.     [provider]  dutasteride (AVODART) 0.5 MG capsule Take 0.5 mg by mouth daily.    [provider]  ELIQUIS 5 MG TABS tablet Take 1 tablet by mouth 2 (two) times daily. 01/29/17   [provider]  escitalopram (LEXAPRO) 5 MG tablet Take 1 tablet (5 mg total) by mouth at bedtime. 11/09/16   Theodis Blaze, MD  furosemide (LASIX) 40 MG tablet Take 1 tablet (40 mg total) by mouth 2 (two) times daily. Take extra 40 mg midday x 4 days only 07/15/17 07/10/18  Croitoru, Mihai, MD  hydrOXYzine (ATARAX/VISTARIL) 25 MG tablet TAKE 1 TABLET BY MOUTH EVERY 6 HOURS AS NEEDED FOR ITCHING 07/07/17   Croitoru, Mihai, MD  loratadine (CLARITIN) 10 MG tablet Take 10 mg by mouth daily.    [provider]  mirtazapine (REMERON) 15 MG tablet Take 15 mg by mouth at bedtime.    [provider]  morphine (MSIR) 15 MG tablet Take 1 tablet (15 mg total) by mouth every 4 (four) hours as  needed for severe pain. 04/05/17   Deno Etienne, DO  Multiple Vitamins-Minerals (PRESERVISION AREDS 2) CAPS Take 1 capsule by mouth daily.    [provider]  nitroGLYCERIN (NITROSTAT) 0.4 MG SL tablet Place 0.4 mg under the tongue every 5 (five) minutes as needed. Chest pain    [provider]  Omega-3 Fatty Acids (OMEGA-3 PO) Take 1 capsule by mouth 2 (two) times daily.    [provider]  pantoprazole (PROTONIX) 40 MG tablet Take 1 tablet (40 mg total) by mouth daily. 11/10/16   Theodis Blaze, MD  potassium chloride (K-DUR) 10 MEQ tablet Take 1 tablet (10 mEq total) by mouth daily. 03/17/17   Croitoru, Mihai, MD  rOPINIRole (REQUIP) 3 MG tablet Take 1 tablet by mouth daily. Take 1 tab daily 08/02/15   [provider]  Tamsulosin HCl (FLOMAX) 0.4 MG CAPS Take 0.4 mg by mouth daily after breakfast.    [provider]  traMADol (ULTRAM) 50 MG tablet Take 50 mg by mouth 4 (four) times daily. For  pain     [provider]    Family History Family History  Problem Relation Age of Onset  . Cancer Mother   . Heart attack Father   . Diabetes Brother     Social History Social History   Tobacco Use  . Smoking status: Never Smoker  . Smokeless tobacco: Never Used  Substance Use Topics  . Alcohol use: No  . Drug use: No     Allergies   Hydrocodone-acetaminophen; Vicodin [hydrocodone-acetaminophen]; and Hydrocodone   Review of Systems Review of Systems  Constitutional: Negative for fever.  Respiratory: Positive for shortness of breath. Negative for cough.   Cardiovascular: Negative for chest pain.  Gastrointestinal: Negative for abdominal pain, nausea and vomiting.  Genitourinary: Positive for scrotal swelling.  Musculoskeletal: Negative for back pain.  Skin: Positive for color change.  All other systems reviewed and are negative.    Physical Exam Updated Vital Signs BP 125/66   Pulse (!) 59   Temp 98.7 F (37.1 C) (Oral)    Resp 18   SpO2 98%   Physical Exam  Constitutional: He is oriented to person, place, and time.  Chronically ill-appearing, no acute distress  HENT:  Head: Normocephalic and atraumatic.  Eyes: Pupils are equal, round, and reactive to light.  Cardiovascular: Normal rate, regular rhythm and normal heart sounds.  Pulmonary/Chest: Effort normal. No respiratory distress. He has no wheezes. He has rales.  Rales primarily bilateral lower lobes  Abdominal: Soft. Bowel sounds are normal.  Genitourinary:  Genitourinary Comments: Scrotal edema noted, no crepitus, no overlying skin changes  Musculoskeletal:       Right lower leg: He exhibits edema.       Left lower leg: He exhibits edema.  Trace to 1+ symmetric bilateral lower extremity edema  Lymphadenopathy:    He has no cervical adenopathy.  Neurological: He is alert and oriented to person, place, and time.  Skin: Skin is warm and dry.  Multiple areas of bruising and non-blanching erythema over the abdomen, left leg, flank  Psychiatric: He has a normal mood and affect.  Nursing note and vitals reviewed.    ED Treatments / Results  Labs (all labs ordered are listed, but only abnormal results are displayed) Labs Reviewed  BASIC METABOLIC PANEL - Abnormal; Notable for the following components:      Result Value   Glucose, Bld 131 (*)    BUN 71 (*)    Creatinine, Ser 1.79 (*)    Calcium 8.7 (*)    GFR calc non Af Amer 33 (*)    GFR calc Af Amer 39 (*)    All other components within normal limits  CBC - Abnormal; Notable for the following components:   WBC 3.9 (*)    RBC 2.46 (*)    Hemoglobin 8.6 (*)    HCT 26.6 (*)    MCV 108.1 (*)    MCH 35.0 (*)    RDW 19.3 (*)    Platelets 128 (*)    All other components within normal limits  BRAIN NATRIURETIC PEPTIDE  PROTIME-INR  I-STAT TROPONIN, ED    EKG  EKG Interpretation  Date/Time:  Monday September 26 2017 01:17:46 EST Ventricular Rate:  60 PR Interval:    QRS  Duration: 222 QT Interval:  530 QTC Calculation: 530 R Axis:   -90 Text Interpretation:  Ventricular-paced rhythm Abnormal ECG No significant change since last tracing Confirmed by Thayer Jew [phone]) on 09/26/2017 1:21:17 AM  Radiology Dg Chest 2 View  Result Date: 09/26/2017 CLINICAL DATA:  Shortness of breath EXAM: CHEST  2 VIEW COMPARISON:  11/03/2016 FINDINGS: Left-sided pacing device as before. Cardiomegaly with central vascular congestion. No pleural effusion or focal pulmonary opacity. Aortic atherosclerosis. No pneumothorax. Degenerative changes at the right shoulder with postsurgical change at the humeral head. IMPRESSION: 1. Cardiomegaly with vascular congestion 2. No focal pulmonary infiltrate is seen Electronically Signed   By: Donavan Foil M.D.   On: 09/26/2017 01:41    Procedures Procedures (including critical care time)  CRITICAL CARE Performed by: Merryl Hacker   Total critical care time: 35 minutes  Critical care time was exclusive of separately billable procedures and treating other patients.  Critical care was necessary to treat or prevent imminent or life-threatening deterioration.  Critical care was time spent personally by me on the following activities: development of treatment plan with patient and/or surrogate as well as nursing, discussions with consultants, evaluation of patient's response to treatment, examination of patient, obtaining history from patient or surrogate, ordering and performing treatments and interventions, ordering and review of laboratory studies, ordering and review of radiographic studies, pulse oximetry and re-evaluation of patient's condition.   Medications Ordered in ED Medications  furosemide (LASIX) 120 mg in dextrose 5 % 50 mL IVPB (not administered)  allopurinol (ZYLOPRIM) tablet 200 mg (not administered)  docusate calcium (SURFAK) capsule 240 mg (not administered)  dutasteride (AVODART) capsule 0.5 mg (not  administered)  apixaban (ELIQUIS) tablet 2.5 mg (not administered)  escitalopram (LEXAPRO) tablet 5 mg (not administered)  hydrOXYzine (ATARAX/VISTARIL) tablet 25 mg (not administered)  loratadine (CLARITIN) tablet 10 mg (not administered)  mirtazapine (REMERON) tablet 15 mg (not administered)  morphine (MSIR) tablet 15 mg (not administered)  PRESERVISION AREDS 2 CAPS 1 capsule (not administered)  Omega-3 CAPS 1,000 mg (not administered)  pantoprazole (PROTONIX) EC tablet 40 mg (not administered)  potassium chloride (K-DUR) CR tablet 10 mEq (not administered)  rOPINIRole (REQUIP) tablet 3 mg (not administered)  tamsulosin (FLOMAX) capsule 0.4 mg (not administered)  traMADol (ULTRAM) tablet 50 mg (not administered)  sodium chloride flush (NS) 0.9 % injection 3 mL (not administered)  sodium chloride flush (NS) 0.9 % injection 3 mL (not administered)  0.9 %  sodium chloride infusion (not administered)  acetaminophen (TYLENOL) tablet 650 mg (not administered)  ondansetron (ZOFRAN) injection 4 mg (not administered)  furosemide (LASIX) injection 40 mg (not administered)     Initial Impression / Assessment and Plan / ED Course  I have reviewed the triage vital signs and the nursing notes.  Pertinent labs & imaging results that were available during my care of the patient were reviewed by me and considered in my medical decision making (see chart for details).     Patient presents with shortness of breath and scrotal edema.  Noted to be hypoxic in triage.  He is chronically ill-appearing but no acute distress.  He does have crackles and rails.  Overall clinical picture suggestive of heart failure.  I have reviewed his prior studies.  His x-ray shows evidence of pulmonary edema.  Patient was given 120 mg of IV Lasix.  Renal function appears to be at his baseline.  Troponin is 0.04.  EKG is nonischemic.  Patient was initially discussed with cardiology who felt he was appropriate for hospitalist  admission for diuresis.  After history, exam, and medical workup I feel the patient has been appropriately medically screened and is safe for discharge home. Pertinent diagnoses were discussed  with the patient. Patient was given return precautions.    Final Clinical Impressions(s) / ED Diagnoses   Final diagnoses:  Acute on chronic systolic congestive heart failure (HCC)  Hypoxia  Shortness of breath  Scrotal edema    ED Discharge Orders    None       Merryl Hacker, MD 09/26/17 [phone]

## 2017-09-26 NOTE — ED Notes (Signed)
Pt placed on 2lpm O2 by N/C due to low O2 stats. O2 stats came up to 93

## 2017-09-26 NOTE — Progress Notes (Signed)
   Follow Up Note  HPI: 84 year old male with past medical history of CAD, chronic systolic CHF, atrial fibrillation and stage III chronic kidney disease admitted on the early morning of 2/18 with complaints of progressively worsening shortness of breath and lower extremity swelling. Patient had a an echocardiogram done last week which noted a low EF of 25-30 percent, but after discussion with his cardiologist, EF was felt more likely 40-45 percent which is patient's baseline.  According to patient's daughter who is at the bedside, patient was at home with his wife, and she was concerned that he may at times be inconsistent in taking his medications. In the emergency room, patient was found to have stable kidney disease and normal troponin, noted to have oxygen saturations around 87% on room air. Patient received aggressive IV Lasix and diuresed several liters.  Pt admitted earlier this morning.  Seenwhile still in the emergency room in follow-up. Patient states he still low but better. His breathing is a little bit easier. He himself has no complaints. Nursing reported that he had gotten confused, but he seems to be relatively alert and oriented.  Exam:   Gen.: Alert and oriented 2, no acute distress  CV: Irregular rhythm, rate controlled  Lungs:Moderate inspiratory effort, few crackles at bases  YYQ:MGNO, nontender, nondistended, hypoactive bowel sounds  Ext:1+ pitting edema from the knees down  Present on Admission: . Acute on chronic systolic CHF (congestive heart failure) (HCC) causing acute on chronic respiratory failure with hypoxia: Already significantly improving. Patient's baseline is normally 2 L nasal cannula at night only. Continue IV Lasix, follow creatinine. . Chronic atrial fibrillation [facility]): Chads 2 score of 5. Continue eliquis renally adjust . PAH (pulmonary artery hypertension) ([place]) . Pancytopenia ([place]) . CKD (chronic kidney disease), stage III [facility]): Looks to be at baseline.  Creatinine this morning at 1.79 . Chronic pain Acute encephalopathy: May been secondary to hypoxia versus sundowning/hospital psychosis. Patient's other daughter who is at the bedside now relates that he does get confused at times.   Disposition: Discharge once better diuresed.

## 2017-09-27 DIAGNOSIS — J9601 Acute respiratory failure with hypoxia: Secondary | ICD-10-CM

## 2017-09-27 LAB — BASIC METABOLIC PANEL WITH GFR
Anion gap: 11 (ref 5–15)
BUN: 56 mg/dL — ABNORMAL HIGH (ref 6–20)
CO2: 31 mmol/L (ref 22–32)
Calcium: 8.6 mg/dL — ABNORMAL LOW (ref 8.9–10.3)
Chloride: 100 mmol/L — ABNORMAL LOW (ref 101–111)
Creatinine, Ser: 1.58 mg/dL — ABNORMAL HIGH (ref 0.61–1.24)
GFR calc Af Amer: 45 mL/min — ABNORMAL LOW
GFR calc non Af Amer: 39 mL/min — ABNORMAL LOW
Glucose, Bld: 107 mg/dL — ABNORMAL HIGH (ref 65–99)
Potassium: 3.7 mmol/L (ref 3.5–5.1)
Sodium: 142 mmol/L (ref 135–145)

## 2017-09-27 LAB — MRSA PCR SCREENING: MRSA by PCR: NEGATIVE

## 2017-09-27 LAB — MAGNESIUM: MAGNESIUM: 2.1 mg/dL (ref 1.7–2.4)

## 2017-09-27 MED ORDER — FUROSEMIDE 40 MG PO TABS: 80.0000 mg | ORAL_TABLET | Freq: Two times a day (BID) | ORAL | 3 refills | Status: DC

## 2017-09-27 NOTE — Progress Notes (Addendum)
Cardiologist paged regarding [place] PVC, Stat BMET and Mg is drawn. pt is asymptomatic, vitals stable, denies SOB and distress, this time resting well, on CPAP, will continue to monitor  BMET and Mg , came back to normal  [place], Therapist, sports

## 2017-09-27 NOTE — Plan of Care (Signed)
  Progressing Education: Ability to demonstrate management of disease process will improve 09/27/2017 1134 - Progressing by Imagene Gurney, RN

## 2017-09-27 NOTE — Discharge Summary (Addendum)
Discharge Summary  PLUMER MITTELSTAEDT XHB:[id number] DOB: 1932-07-14  PCP: [name], MD  Admit date: 09/26/2017 Discharge date: 09/27/2017  Time spent: 25 minutes   Recommendations for Outpatient Follow-up:  Patient will follow-up with his PCP in the next 1-2 weeks Medication change: Lasix increased from 80 mg in the morning/40 mg in the evening to 80 mg twice aday   Discharge Diagnoses:  Active Hospital Problems   Diagnosis Date Noted  . Acute on chronic systolic CHF (congestive heart failure) ([place]) 09/09/2015  . Pancytopenia ([place]) 09/26/2017  . CKD (chronic kidney disease), stage III ([place]) 09/26/2017  . Chronic pain 09/26/2017  . Acute respiratory failure with hypoxia ([place]) 09/26/2017  . Chronic atrial fibrillation ([place]) 11/23/2016  . PAH (pulmonary artery hypertension) ([place])   . OSA on CPAP 10/13/2011    Resolved Hospital Problems  No resolved problems to display.    Discharge Condition: Improved, being discharged with home health   Diet recommendation: heart healthy   Vitals:   09/27/17 0542 09/27/17 0849  BP: 111/66 (!) 121/56  Pulse: 60 (!) 59  Resp: 18 18  Temp: 98.4 F (36.9 C) 97.9 F (36.6 C)  SpO2: 95% 96%    History of present illness:  84 year old male with past medical history of CAD, chronic systolic CHF, atrial fibrillation and stage III chronic kidney disease admitted on the early morning of 2/18 with complaints of progressively worsening shortness of breath and lower extremity swelling. Patient had a an echocardiogram done last week which noted a low EF of 25-30 percent, but after discussion with his cardiologist, EF was felt more likely 40-45 percent which is patient's baseline.  According to patient's daughter who is at the bedside, patient was at home with his wife, and she was concerned that he may at times be inconsistent in taking his medications. In the emergency room, patient was found to have stable kidney disease and normal troponin,  noted to have oxygen saturations around 87% on room air. Patient received aggressive IV Lasix and diuresed several liters.      Hospital Course:  . Acute on chronic systolic CHF (congestive heart failure) (HCC) causing acute on chronic respiratory failure with hypoxia: Already significantly improving. Patient's baseline is normally 2 L nasal cannula at night only. By 2/19, patient had diuresed over 39 L. Weight was down several pounds. he was able to be weaned off of oxygen breathing comfortably on room air.  Scrotum still swollen and he likely still will benefit from additional diuresis but given overall improvement in hypoxia, we can manage this at home. The patient recovered much more quickly than anticipated given his hypoxia. Have increased his Lasix from 80 mg in the morning and 40 mg in the evening to 80 mg twice a day . Chronic atrial fibrillation [facility]): Chads 2 score of 5. Continue eliquis renally adjusted . PAH (pulmonary artery hypertension) ([place]) . Pancytopenia ([place]) . CKD (chronic kidney disease), stage III [facility]): Looks to be at baseline. Creatinine at admission at 1.79. With Lasix actually improved and at 1.58 with GFR of 39 by day of discharge . Chronic pain Acute encephalopathy: May been secondary to hypoxia versus sundowning/hospital psychosis. Patient's other daughter who is at the bedside now relates that he does get confused at times. Patient alert and oriented by day of discharge Deconditioning: Seen by PT who recommended home health physical therapy. We are setting this up    Procedures:  None   Consultations:  None   Discharge Exam:  BP (!) 121/56 (BP Location: Left Arm)   Pulse (!) 59   Temp 97.9 F (36.6 C) (Oral)   Resp 18   Ht 5\' 7"  (1.702 m)   Wt 96.3 kg (212 lb 3.2 oz)   SpO2 96%   BMI 33.24 kg/m   General: Alert and oriented 2, no acute distress  Cardiovascular: Irregular rhythm, rate controlled Respiratory: Clear to auscultation bilaterally    Discharge Instructions You were cared for by a hospitalist during your hospital stay. If you have any questions about your discharge medications or the care you received while you were in the hospital after you are discharged, you can call the unit and asked to speak with the hospitalist on call if the hospitalist that took care of you is not available. Once you are discharged, your primary care physician will handle any further medical issues. Please note that NO REFILLS for any discharge medications will be authorized once you are discharged, as it is imperative that you return to your primary care physician (or establish a relationship with a primary care physician if you do not have one) for your aftercare needs so that they can reassess your need for medications and monitor your lab values.  Discharge Instructions    Diet - low sodium heart healthy   Complete by:  As directed    Increase activity slowly   Complete by:  As directed      Allergies as of 09/27/2017      Reactions   Hydrocodone-acetaminophen Itching   Vicodin [hydrocodone-acetaminophen] Nausea And Vomiting   Hydrocodone Nausea Only      Medication List    TAKE these medications   acetaminophen 650 MG CR tablet Commonly known as:  TYLENOL Take 650 mg by mouth every 8 (eight) hours as needed for pain.   allopurinol 100 MG tablet Commonly known as:  ZYLOPRIM Take 2 tablets (200 mg total) by mouth daily.   COLCRYS 0.6 MG tablet Generic drug:  colchicine Take 0.6 mg by mouth daily as needed (gout).   dutasteride 0.5 MG capsule Commonly known as:  AVODART Take 0.5 mg by mouth daily.   ELIQUIS 5 MG Tabs tablet Generic drug:  apixaban Take 5 mg by mouth 2 (two) times daily.   escitalopram 5 MG tablet Commonly known as:  LEXAPRO Take 1 tablet (5 mg total) by mouth at bedtime.   ferrous sulfate 325 (65 FE) MG tablet Take 325 mg by mouth daily with breakfast.   furosemide 40 MG tablet Commonly known as:   LASIX Take 2 tablets (80 mg total) by mouth 2 (two) times daily for 14 days. Take extra 40 mg midday x 4 days only What changed:  how much to take   hydrOXYzine 25 MG tablet Commonly known as:  ATARAX/VISTARIL TAKE 1 TABLET BY MOUTH EVERY 6 HOURS AS NEEDED FOR ITCHING   loratadine 10 MG tablet Commonly known as:  CLARITIN Take 10 mg by mouth daily.   mirtazapine 15 MG tablet Commonly known as:  REMERON Take 15 mg by mouth at bedtime.   multivitamin with minerals Tabs tablet Take 1 tablet by mouth daily.   nitroGLYCERIN 0.4 MG SL tablet Commonly known as:  NITROSTAT Place 0.4 mg under the tongue every 5 (five) minutes as needed. Chest pain   OMEGA-3 PO Take 1 capsule by mouth 2 (two) times daily.   pantoprazole 40 MG tablet Commonly known as:  PROTONIX Take 1 tablet (40 mg total) by mouth daily.  potassium chloride 10 MEQ tablet Commonly known as:  K-DUR Take 1 tablet (10 mEq total) by mouth daily. What changed:  when to take this   PRESERVISION AREDS 2 Caps Take 1 capsule by mouth daily.   rOPINIRole 3 MG tablet Commonly known as:  REQUIP Take 1 tablet by mouth daily. Take 1 tab daily   STOOL SOFTENER PO Take 2 tablets by mouth at bedtime.   tamsulosin 0.4 MG Caps capsule Commonly known as:  FLOMAX Take 0.4 mg by mouth daily after breakfast.   traMADol 50 MG tablet Commonly known as:  ULTRAM Take 100 mg by mouth 2 (two) times daily. For pain      Allergies  Allergen Reactions  . Hydrocodone-Acetaminophen Itching  . Vicodin [Hydrocodone-Acetaminophen] Nausea And Vomiting  . Hydrocodone Nausea Only   Follow-up Information    [name], [name] Him, MD Follow up.   Specialty:  Internal Medicine Why:  Claiborne Billings will call patient Contact information: [employer] [place] [address] [phone]            The results of significant diagnostics from this hospitalization (including imaging, microbiology, ancillary and laboratory) are listed below  for reference.    Significant Diagnostic Studies: Dg Chest 2 View  Result Date: 09/26/2017 CLINICAL DATA:  Shortness of breath EXAM: CHEST  2 VIEW COMPARISON:  11/03/2016 FINDINGS: Left-sided pacing device as before. Cardiomegaly with central vascular congestion. No pleural effusion or focal pulmonary opacity. Aortic atherosclerosis. No pneumothorax. Degenerative changes at the right shoulder with postsurgical change at the humeral head. IMPRESSION: 1. Cardiomegaly with vascular congestion 2. No focal pulmonary infiltrate is seen Electronically Signed   By: Donavan Foil M.D.   On: 09/26/2017 01:41    Microbiology: Recent Results (from the past 240 hour(s))  MRSA PCR Screening     Status: None   Collection Time: 09/27/17  9:10 AM  Result Value Ref Range Status   MRSA by PCR NEGATIVE NEGATIVE Final    Comment:        The GeneXpert MRSA Assay (FDA approved for NASAL specimens only), is one component of a comprehensive MRSA colonization surveillance program. It is not intended to diagnose MRSA infection nor to guide or monitor treatment for MRSA infections. Performed at [facility], [place] [address] [place],  [address]      Labs: Basic Metabolic Panel: Recent Labs  Lab 09/26/17 0119 09/27/17 0210  NA 140 142  K 4.2 3.7  CL 102 100*  CO2 27 31  GLUCOSE 131* 107*  BUN 71* 56*  CREATININE 1.79* 1.58*  CALCIUM 8.7* 8.6*  MG  --  2.1   Liver Function Tests: Recent Labs  Lab 09/26/17 0348  AST 60*  ALT 34  ALKPHOS 84  BILITOT 1.2  PROT 6.6  ALBUMIN 3.3*   No results for input(s): LIPASE, AMYLASE in the last 168 hours. No results for input(s): AMMONIA in the last 168 hours. CBC: Recent Labs  Lab 09/26/17 0119  WBC 3.9*  HGB 8.6*  HCT 26.6*  MCV 108.1*  PLT 128*   Cardiac Enzymes: No results for input(s): CKTOTAL, CKMB, CKMBINDEX, TROPONINI in the last 168 hours. BNP: BNP (last 3 results) Recent Labs    11/04/16 1532 03/09/17 1019  09/26/17 0229  BNP 1,048.9* 395.1* 825.6*    ProBNP (last 3 results) No results for input(s): PROBNP in the last 8760 hours.  CBG: No results for input(s): GLUCAP in the last 168 hours.     Signed:  Annita Brod, MD [facility] 09/27/2017, 12:53 PM

## 2017-09-27 NOTE — Care Management Note (Signed)
Case Management Note  Patient Details  Name: [name] MRN: [id number] Date of Birth: Oct 21, 1931  Subjective/Objective:   CHF                Action/Plan: Patient lives at home with spouse; PCP: [name], [name] Him, MD; has private insurance with Medicare; pharmacy of choice is [facility]; DME - cane, walker, motorized wheelchair; Patient could benefit from a Disease Management Program for CHF; [place] choice offered, pt chose [facility]; [place] with [facility] called for arrangements.  Expected Discharge Date:  09/27/17               Expected Discharge Plan:  [place]  Discharge planning Services  CM Consult  Choice offered to:  Patient  HH Arranged:  RN, Disease Management, PT [place] Agency:  [facility] (formerly [facility])  Status of Service:  In process, will continue to follow  Sherrilyn Rist [phone] 09/27/2017, 1:40 PM

## 2017-09-27 NOTE — Progress Notes (Signed)
Pt placed on CPAP @ 10cmh2o and nasal mask. tol well at this time.

## 2017-09-27 NOTE — Evaluation (Signed)
Physical Therapy Evaluation Patient Details Name: [name] MRN: [id number] DOB: 09-Dec-1931 Today's Date: 09/27/2017   History of Present Illness  Pt is an 84 y.o. male admitted 09/26/17 with CHF exacerbation and acute hypoxic respiratory failure. CXR with vascular congestion. PMH includes a-fib, CKD III, OSA on CPAP, gout, CAD, chronic pain, pacemaker insertion.    Clinical Impression  Pt presents with an overall decrease in functional mobility secondary to above. 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 Pt endorses multiple falls at home. Today, able to amb 250' with RW and min guard for balance; required multiple cues for safety throughout session. Pt's generalized weakness, decreased activity tolerance, and decreased safety awareness significantly increase his risk for falls. Discussed recommendation for SNF-level therapies at d/c to maximize functional mobility and independence prior to return home, but pt adamantly refusing; unclear if children/grandchildren are available for true 24/7 support if needed. Will follow acutely to address established goals.     Follow Up Recommendations Home health PT;Supervision/Assistance - 24 hour(refusing SNF)    Equipment Recommendations  None recommended by PT    Recommendations for Other Services OT consult     Precautions / Restrictions Precautions Precautions: Fall Restrictions Weight Bearing Restrictions: No      Mobility  Bed Mobility Overal bed mobility: Independent                Transfers Overall transfer level: Needs assistance Equipment used: Rolling walker (2 wheeled) Transfers: Sit to/from Stand Sit to Stand: Supervision            Ambulation/Gait Ambulation/Gait assistance: Min guard Ambulation Distance (Feet): 250 Feet Assistive device: Rolling walker (2 wheeled) Gait Pattern/deviations: Step-through pattern;Decreased stride length;Trunk flexed Gait velocity:  Decreased   General Gait Details: Slightly unsteady, impulsive amb with RW and min guard for balance. Pt requiring multiple, repeated cues for closer proximity and safety with RW; states he moves RW further away with BUEs fatigue, despite educ to stop and rest instead. 2x standing rest break secondary to fatigue  Stairs            Wheelchair Mobility    Modified Rankin (Stroke Patients Only)       Balance Overall balance assessment: Needs assistance   Sitting balance-Leahy Scale: Good       Standing balance-Leahy Scale: Poor                               Pertinent Vitals/Pain Pain Assessment: No/denies pain    Home Living Family/patient expects to be discharged to:: Private residence Living Arrangements: Spouse/significant other Available Help at Discharge: Family;Available PRN/intermittently Type of Home: House Home Access: Ramped entrance     Home Layout: One level Home Equipment: Walker - 2 wheels;Cane - single point;Electric scooter;Crutches;Tub bench Additional Comments: 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 Children/grandchildren live nearby and assist PRN; children help care for pt's wife as well    Prior Function Level of Independence: Independent with assistive device(s)         Comments: Intermittent use of SPC or RW. Pt still drives. Assists wife with ADLs. Pt endorses multiple falls at home     Hand Dominance        Extremity/Trunk Assessment   Upper Extremity Assessment Upper Extremity Assessment: Generalized weakness    Lower Extremity Assessment Lower Extremity Assessment: Generalized weakness    Cervical / Trunk Assessment Cervical / Trunk Assessment: Kyphotic  Communication   Communication: No difficulties  Cognition Arousal/Alertness: Awake/alert Behavior During Therapy: WFL for tasks assessed/performed Overall Cognitive Status: No family/caregiver present to determine baseline cognitive functioning Area of Impairment:  Attention;Safety/judgement;Problem solving                   Current Attention Level: Sustained     Safety/Judgement: Decreased awareness of safety   Problem Solving: Requires verbal cues General Comments: Pt slightly impulsive with movement throughout session. Required multiple cues for safety, specifically regarding risk for falls and keeping close proximity to RW      General Comments      Exercises     Assessment/Plan    PT Assessment Patient needs continued PT services  PT Problem List Decreased strength;Decreased activity tolerance;Decreased balance;Decreased mobility;Decreased safety awareness       PT Treatment Interventions DME instruction;Gait training;Functional mobility training;Therapeutic activities;Therapeutic exercise;Balance training;Patient/family education    PT Goals (Current goals can be found in the Care Plan section)  Acute Rehab PT Goals Patient Stated Goal: Return home asap PT Goal Formulation: With patient Time For Goal Achievement: 10/11/17 Potential to Achieve Goals: Good    Frequency Min 3X/week   Barriers to discharge Decreased caregiver support      Co-evaluation               AM-PAC PT "6 Clicks" Daily Activity  Outcome Measure Difficulty turning over in bed (including adjusting bedclothes, sheets and blankets)?: None Difficulty moving from lying on back to sitting on the side of the bed? : None Difficulty sitting down on and standing up from a chair with arms (e.g., wheelchair, bedside commode, etc,.)?: A Little Help needed moving to and from a bed to chair (including a wheelchair)?: A Little Help needed walking in hospital room?: A Little Help needed climbing 3-5 steps with a railing? : A Lot 6 Click Score: 19    End of Session Equipment Utilized During Treatment: Gait belt Activity Tolerance: Patient tolerated treatment well Patient left: in chair;with call bell/phone within reach;with chair alarm set Nurse  Communication: Mobility status PT Visit Diagnosis: Other abnormalities of gait and mobility (R26.89);Repeated falls (R29.6)    Time: 6389-3734 PT Time Calculation (min) (ACUTE ONLY): 26 min   Charges:   PT Evaluation $PT Eval Moderate Complexity: 1 Mod PT Treatments $Gait Training: 8-22 mins   PT G Codes:       Mabeline Caras, PT, DPT Acute Rehab Services  Pager: [place] 09/27/2017, 10:09 AM

## 2017-09-30 ENCOUNTER — Emergency Department (HOSPITAL_COMMUNITY): Payer: Medicare Other

## 2017-09-30 ENCOUNTER — Other Ambulatory Visit: Payer: Self-pay

## 2017-09-30 ENCOUNTER — Telehealth: Payer: Self-pay | Admitting: Cardiovascular Disease

## 2017-09-30 ENCOUNTER — Encounter (HOSPITAL_COMMUNITY): Payer: Self-pay

## 2017-09-30 ENCOUNTER — Inpatient Hospital Stay (HOSPITAL_COMMUNITY)
Admission: EM | Admit: 2017-09-30 | Discharge: 2017-10-02 | DRG: 291 | Disposition: A | Discharge status: Home Health | Payer: Medicare Other | Attending: Internal Medicine | Admitting: Internal Medicine

## 2017-09-30 DIAGNOSIS — I509 Heart failure, unspecified: Secondary | ICD-10-CM | POA: Diagnosis not present

## 2017-09-30 DIAGNOSIS — N400 Enlarged prostate without lower urinary tract symptoms: Secondary | ICD-10-CM | POA: Diagnosis present

## 2017-09-30 DIAGNOSIS — I252 Old myocardial infarction: Secondary | ICD-10-CM | POA: Diagnosis not present

## 2017-09-30 DIAGNOSIS — Z9989 Dependence on other enabling machines and devices: Secondary | ICD-10-CM

## 2017-09-30 DIAGNOSIS — E785 Hyperlipidemia, unspecified: Secondary | ICD-10-CM | POA: Diagnosis present

## 2017-09-30 DIAGNOSIS — Z9841 Cataract extraction status, right eye: Secondary | ICD-10-CM

## 2017-09-30 DIAGNOSIS — Z96653 Presence of artificial knee joint, bilateral: Secondary | ICD-10-CM | POA: Diagnosis present

## 2017-09-30 DIAGNOSIS — I482 Chronic atrial fibrillation, unspecified: Secondary | ICD-10-CM | POA: Diagnosis present

## 2017-09-30 DIAGNOSIS — I428 Other cardiomyopathies: Secondary | ICD-10-CM | POA: Diagnosis present

## 2017-09-30 DIAGNOSIS — Z950 Presence of cardiac pacemaker: Secondary | ICD-10-CM

## 2017-09-30 DIAGNOSIS — I5023 Acute on chronic systolic (congestive) heart failure: Secondary | ICD-10-CM | POA: Diagnosis present

## 2017-09-30 DIAGNOSIS — Z6831 Body mass index (BMI) 31.0-31.9, adult: Secondary | ICD-10-CM

## 2017-09-30 DIAGNOSIS — I2729 Other secondary pulmonary hypertension: Secondary | ICD-10-CM | POA: Diagnosis present

## 2017-09-30 DIAGNOSIS — Z9842 Cataract extraction status, left eye: Secondary | ICD-10-CM | POA: Diagnosis not present

## 2017-09-30 DIAGNOSIS — N183 Chronic kidney disease, stage 3 unspecified: Secondary | ICD-10-CM | POA: Diagnosis present

## 2017-09-30 DIAGNOSIS — Z9861 Coronary angioplasty status: Secondary | ICD-10-CM

## 2017-09-30 DIAGNOSIS — D61818 Other pancytopenia: Secondary | ICD-10-CM | POA: Diagnosis present

## 2017-09-30 DIAGNOSIS — R0602 Shortness of breath: Secondary | ICD-10-CM | POA: Diagnosis not present

## 2017-09-30 DIAGNOSIS — I5022 Chronic systolic (congestive) heart failure: Secondary | ICD-10-CM | POA: Diagnosis present

## 2017-09-30 DIAGNOSIS — M109 Gout, unspecified: Secondary | ICD-10-CM | POA: Diagnosis present

## 2017-09-30 DIAGNOSIS — E43 Unspecified severe protein-calorie malnutrition: Secondary | ICD-10-CM | POA: Diagnosis present

## 2017-09-30 DIAGNOSIS — K219 Gastro-esophageal reflux disease without esophagitis: Secondary | ICD-10-CM | POA: Diagnosis present

## 2017-09-30 DIAGNOSIS — I272 Pulmonary hypertension, unspecified: Secondary | ICD-10-CM | POA: Diagnosis present

## 2017-09-30 DIAGNOSIS — G2581 Restless legs syndrome: Secondary | ICD-10-CM | POA: Diagnosis present

## 2017-09-30 DIAGNOSIS — I130 Hypertensive heart and chronic kidney disease with heart failure and stage 1 through stage 4 chronic kidney disease, or unspecified chronic kidney disease: Principal | ICD-10-CM | POA: Diagnosis present

## 2017-09-30 DIAGNOSIS — I481 Persistent atrial fibrillation: Secondary | ICD-10-CM | POA: Diagnosis present

## 2017-09-30 DIAGNOSIS — G4733 Obstructive sleep apnea (adult) (pediatric): Secondary | ICD-10-CM | POA: Diagnosis present

## 2017-09-30 DIAGNOSIS — R5383 Other fatigue: Secondary | ICD-10-CM | POA: Diagnosis not present

## 2017-09-30 DIAGNOSIS — Z79899 Other long term (current) drug therapy: Secondary | ICD-10-CM

## 2017-09-30 DIAGNOSIS — Z961 Presence of intraocular lens: Secondary | ICD-10-CM | POA: Diagnosis present

## 2017-09-30 DIAGNOSIS — I2510 Atherosclerotic heart disease of native coronary artery without angina pectoris: Secondary | ICD-10-CM | POA: Diagnosis present

## 2017-09-30 DIAGNOSIS — I4819 Other persistent atrial fibrillation: Secondary | ICD-10-CM | POA: Diagnosis present

## 2017-09-30 DIAGNOSIS — E876 Hypokalemia: Secondary | ICD-10-CM | POA: Diagnosis not present

## 2017-09-30 DIAGNOSIS — Z7901 Long term (current) use of anticoagulants: Secondary | ICD-10-CM

## 2017-09-30 HISTORY — DX: Heart failure, unspecified: I50.9

## 2017-09-30 HISTORY — DX: Chronic kidney disease, stage 3 (moderate): N18.3

## 2017-09-30 HISTORY — DX: Personal history of other medical treatment: Z92.89

## 2017-09-30 HISTORY — DX: Gastro-esophageal reflux disease without esophagitis: K21.9

## 2017-09-30 HISTORY — DX: Restless legs syndrome: G25.81

## 2017-09-30 HISTORY — DX: Essential (primary) hypertension: I10

## 2017-09-30 HISTORY — DX: Unspecified chronic bronchitis: J42

## 2017-09-30 HISTORY — DX: Unspecified osteoarthritis, unspecified site: M19.90

## 2017-09-30 HISTORY — DX: Chronic kidney disease, stage 3 unspecified: N18.30

## 2017-09-30 LAB — I-STAT TROPONIN, ED: Troponin i, poc: 0.11 ng/mL (ref 0.00–0.08)

## 2017-09-30 LAB — URINALYSIS, ROUTINE W REFLEX MICROSCOPIC
Bilirubin Urine: NEGATIVE
Glucose, UA: NEGATIVE mg/dL
Hgb urine dipstick: NEGATIVE
Ketones, ur: NEGATIVE mg/dL
Leukocytes, UA: NEGATIVE
NITRITE: NEGATIVE
Protein, ur: NEGATIVE mg/dL
SPECIFIC GRAVITY, URINE: 1.006 (ref 1.005–1.030)
pH: 6 (ref 5.0–8.0)

## 2017-09-30 LAB — CBC
HEMATOCRIT: 28.7 % — AB (ref 39.0–52.0)
Hemoglobin: 9.2 g/dL — ABNORMAL LOW (ref 13.0–17.0)
MCH: 34.8 pg — AB (ref 26.0–34.0)
MCHC: 32.1 g/dL (ref 30.0–36.0)
MCV: 108.7 fL — AB (ref 78.0–100.0)
Platelets: 120 10*3/uL — ABNORMAL LOW (ref 150–400)
RBC: 2.64 MIL/uL — ABNORMAL LOW (ref 4.22–5.81)
RDW: 19.2 % — ABNORMAL HIGH (ref 11.5–15.5)
WBC: 3.2 10*3/uL — ABNORMAL LOW (ref 4.0–10.5)

## 2017-09-30 LAB — BASIC METABOLIC PANEL
Anion gap: 14 (ref 5–15)
BUN: 53 mg/dL — AB (ref 6–20)
CHLORIDE: 97 mmol/L — AB (ref 101–111)
CO2: 30 mmol/L (ref 22–32)
Calcium: 8.9 mg/dL (ref 8.9–10.3)
Creatinine, Ser: 1.37 mg/dL — ABNORMAL HIGH (ref 0.61–1.24)
GFR calc Af Amer: 53 mL/min — ABNORMAL LOW (ref >60)
GFR calc non Af Amer: 46 mL/min — ABNORMAL LOW (ref >60)
GLUCOSE: 76 mg/dL (ref 65–99)
POTASSIUM: 3.6 mmol/L (ref 3.5–5.1)
Sodium: 141 mmol/L (ref 135–145)

## 2017-09-30 LAB — BRAIN NATRIURETIC PEPTIDE: B Natriuretic Peptide: 842.8 pg/mL — ABNORMAL HIGH (ref 0.0–100.0)

## 2017-09-30 LAB — MAGNESIUM: Magnesium: 2.1 mg/dL (ref 1.7–2.4)

## 2017-09-30 MED ORDER — TAMSULOSIN HCL 0.4 MG PO CAPS
0.4000 mg | ORAL_CAPSULE | Freq: Every day | ORAL | Status: DC
  Administered 2017-10-01 – 2017-10-02 (×2): 0.4 mg via ORAL
  Filled 2017-09-30 (×2): qty 1

## 2017-09-30 MED ORDER — DUTASTERIDE 0.5 MG PO CAPS
0.5000 mg | ORAL_CAPSULE | Freq: Every day | ORAL | Status: DC
  Administered 2017-10-01 – 2017-10-02 (×2): 0.5 mg via ORAL
  Filled 2017-09-30 (×2): qty 1

## 2017-09-30 MED ORDER — ROPINIROLE HCL 1 MG PO TABS
3.0000 mg | ORAL_TABLET | Freq: Every day | ORAL | Status: DC
  Administered 2017-10-01 – 2017-10-02 (×2): 3 mg via ORAL
  Filled 2017-09-30 (×2): qty 3

## 2017-09-30 MED ORDER — ONDANSETRON HCL 4 MG PO TABS: 4.0000 mg | ORAL_TABLET | Freq: Four times a day (QID) | ORAL | Status: DC | PRN

## 2017-09-30 MED ORDER — POTASSIUM CHLORIDE CRYS ER 10 MEQ PO TBCR
10.0000 meq | EXTENDED_RELEASE_TABLET | Freq: Two times a day (BID) | ORAL | Status: DC
  Administered 2017-09-30 – 2017-10-02 (×4): 10 meq via ORAL
  Filled 2017-09-30 (×4): qty 1

## 2017-09-30 MED ORDER — ADULT MULTIVITAMIN W/MINERALS CH
1.0000 | ORAL_TABLET | Freq: Every day | ORAL | Status: DC
  Administered 2017-10-01 – 2017-10-02 (×2): 1 via ORAL
  Filled 2017-09-30 (×2): qty 1

## 2017-09-30 MED ORDER — ESCITALOPRAM OXALATE 10 MG PO TABS
5.0000 mg | ORAL_TABLET | Freq: Every day | ORAL | Status: DC
  Administered 2017-09-30 – 2017-10-01 (×2): 5 mg via ORAL
  Filled 2017-09-30 (×2): qty 1

## 2017-09-30 MED ORDER — MIRTAZAPINE 15 MG PO TABS
15.0000 mg | ORAL_TABLET | Freq: Every day | ORAL | Status: DC
  Administered 2017-09-30 – 2017-10-01 (×2): 15 mg via ORAL
  Filled 2017-09-30 (×2): qty 1

## 2017-09-30 MED ORDER — LORATADINE 10 MG PO TABS
10.0000 mg | ORAL_TABLET | Freq: Every day | ORAL | Status: DC
  Administered 2017-10-01 – 2017-10-02 (×2): 10 mg via ORAL
  Filled 2017-09-30 (×2): qty 1

## 2017-09-30 MED ORDER — DOCUSATE SODIUM 100 MG PO CAPS
100.0000 mg | ORAL_CAPSULE | Freq: Every day | ORAL | Status: DC
  Administered 2017-09-30 – 2017-10-01 (×2): 100 mg via ORAL
  Filled 2017-09-30 (×2): qty 1

## 2017-09-30 MED ORDER — ONDANSETRON HCL 4 MG/2ML IJ SOLN: 4.0000 mg | Freq: Four times a day (QID) | INTRAMUSCULAR | Status: DC | PRN

## 2017-09-30 MED ORDER — ALLOPURINOL 100 MG PO TABS
200.0000 mg | ORAL_TABLET | Freq: Every day | ORAL | Status: DC
  Administered 2017-10-01 – 2017-10-02 (×2): 200 mg via ORAL
  Filled 2017-09-30 (×2): qty 2

## 2017-09-30 MED ORDER — FERROUS SULFATE 325 (65 FE) MG PO TABS
325.0000 mg | ORAL_TABLET | Freq: Every day | ORAL | Status: DC
  Administered 2017-10-01 – 2017-10-02 (×2): 325 mg via ORAL
  Filled 2017-09-30 (×2): qty 1

## 2017-09-30 MED ORDER — FUROSEMIDE 10 MG/ML IJ SOLN
80.0000 mg | Freq: Two times a day (BID) | INTRAMUSCULAR | Status: DC
  Administered 2017-10-01 – 2017-10-02 (×3): 80 mg via INTRAVENOUS
  Filled 2017-09-30 (×3): qty 8

## 2017-09-30 MED ORDER — TRAMADOL HCL 50 MG PO TABS
100.0000 mg | ORAL_TABLET | Freq: Two times a day (BID) | ORAL | Status: DC
  Administered 2017-09-30 – 2017-10-02 (×4): 100 mg via ORAL
  Filled 2017-09-30 (×5): qty 2

## 2017-09-30 MED ORDER — NITROGLYCERIN 0.4 MG SL SUBL: 0.4000 mg | SUBLINGUAL_TABLET | SUBLINGUAL | Status: DC | PRN

## 2017-09-30 MED ORDER — PANTOPRAZOLE SODIUM 40 MG PO TBEC
40.0000 mg | DELAYED_RELEASE_TABLET | Freq: Every day | ORAL | Status: DC
  Administered 2017-10-01 – 2017-10-02 (×2): 40 mg via ORAL
  Filled 2017-09-30 (×2): qty 1

## 2017-09-30 MED ORDER — APIXABAN 5 MG PO TABS
5.0000 mg | ORAL_TABLET | Freq: Two times a day (BID) | ORAL | Status: DC
  Administered 2017-09-30 – 2017-10-02 (×4): 5 mg via ORAL
  Filled 2017-09-30 (×4): qty 1

## 2017-09-30 MED ORDER — FUROSEMIDE 10 MG/ML IJ SOLN
80.0000 mg | Freq: Once | INTRAMUSCULAR | Status: AC
  Administered 2017-09-30: 80 mg via INTRAVENOUS
  Filled 2017-09-30: qty 8

## 2017-09-30 NOTE — Telephone Encounter (Signed)
New message    Pt c/o swelling: STAT is pt has developed SOB within 24 hours  1) How much weight have you gained and in what time span? N/A  2) If swelling, where is the swelling located? SCROTUM  3) Are you currently taking a fluid pill? YES  4) Are you currently SOB? NO  5) Do you have a log of your daily weights (if so, list)? 208lbs  6) Have you gained 3 pounds in a day or 5 pounds in a week? N/A  Have you traveled recently? NO

## 2017-09-30 NOTE — ED Triage Notes (Signed)
Pt presents to the ed with complaints of testicle swelling x 2 days. Patient states he was just discharged with heart failure Tuesday and this feels the same. Pt denies any pain. NAD in triage.

## 2017-09-30 NOTE — ED Notes (Signed)
[place]a RN@[facility]  notified of elevated I-stat trop of 0.11

## 2017-09-30 NOTE — Telephone Encounter (Signed)
09/26/2017 - 09/27/2017 (38 hours) [place] [facility] wife, sheila spoke to wife and pt, pt states that he is SOB on exertion only not all the time. He states that his weight at the hospital was 215 2-19, 212# 09-28-17, 212# 2-21 and today weight is 208. Pt is taking lasix 80 mg BID and 40mg  midday (this midday dose is only prn x4 days). Pt states that the only place that he is seeing the swelling is in his testicles. Pt states that there is no swelling in his LE "maybe a little in his abdomen". He states that when he sits down on use the restroom his testicles dangle into the water in the toilet. Pt states that he will go to the ER ASAP, he states that he will not be able to leave "until noon" but will take it easy until he leaves for the ER. He states he will "call rescue" if needed.

## 2017-09-30 NOTE — ED Provider Notes (Signed)
[place] EMERGENCY DEPARTMENT Provider Note   CSN: [id number] Arrival date & time: 09/30/17  1440     History   Chief Complaint Chief Complaint  Patient presents with  . testicle swelling    HPI [name] is a 84 y.o. male.  The history is provided by the patient, a relative and medical records. No language interpreter was used.  Shortness of Breath  This is a recurrent problem. The average episode lasts 2 days. The problem occurs continuously.The current episode started yesterday. The problem has been gradually worsening. Associated symptoms include leg swelling. Pertinent negatives include no fever, no rhinorrhea, no neck pain, no cough, no sputum production, no wheezing, no chest pain, no syncope, no vomiting, no abdominal pain, no leg pain and no claudication. He has tried nothing for the symptoms. The treatment provided no relief. He has had prior hospitalizations. Associated medical issues include CAD, heart failure and past MI.    Past Medical History:  Diagnosis Date  . Coronary artery disease   . Diverticulitis   . Gout   . Hyperlipidemia   . MI (myocardial infarction) ([place])   . Nonischemic cardiomyopathy (HCC)    mild  . OSA on CPAP   . Permanent atrial fibrillation ([place])   . Pulmonary hypertension [facility])     Patient Active Problem List   Diagnosis Date Noted  . Pancytopenia ([place]) 09/26/2017  . CKD (chronic kidney disease), stage III ([place]) 09/26/2017  . Chronic pain 09/26/2017  . Acute respiratory failure with hypoxia ([place]) 09/26/2017  . Right shoulder injury, initial encounter 04/08/2017  . Chronic atrial fibrillation ([place]) 11/23/2016  . Acute upper GI bleeding 11/23/2016  . Acute delirium 11/07/2016  . Acute confusion   . Acute hyperkalemia   . Secondary hypercoagulable state ([place])   . Transaminitis   . PAH (pulmonary artery hypertension) ([place])   . Acute renal insufficiency 11/03/2016  . Acute on chronic systolic CHF  (congestive heart failure) ([place]) 09/09/2015  . Poor short term memory 11/05/2013  . Fatigue 10/13/2011  . Dyspnea on exertion 10/13/2011  . Atrial fibrillation, persistent, slow VR 10/13/2011  . Pacemaker single chamber, [place], 2013 10/13/2011  . Restless leg syndrome 10/13/2011  . Chronic anticoagulation 10/13/2011  . Nonischemic cardiomyopathy - coronaries by angiography 2010 10/13/2011  . OSA on CPAP 10/13/2011  . Tremor, hereditary, benign 10/13/2011  . Depression 10/13/2011  . Pulmonary hypertension ([place]) 10/13/2011  . MRSA (methicillin resistant Staphylococcus aureus) carrier 10/13/2011  . BPH (benign prostatic hyperplasia) 10/13/2011    Past Surgical History:  Procedure Laterality Date  . CARDIAC CATHETERIZATION  11/07/2008   nonischemic cardiomyopathy,pulmonary hypertension  . COLOSTOMY    . COLOSTOMY REVERSAL    . CORONARY ANGIOPLASTY  06/01/1999   successful to ostium of the first diagonal  . ESOPHAGOGASTRODUODENOSCOPY N/A 08/22/2013   Procedure: ESOPHAGOGASTRODUODENOSCOPY (EGD);  Surgeon: [name], MD;  Location: [facility] ENDOSCOPY;  Service: Endoscopy;  Laterality: N/A;  h/p in file cabinet, jackie  . ESOPHAGOGASTRODUODENOSCOPY N/A 11/05/2014   Procedure: ESOPHAGOGASTRODUODENOSCOPY (EGD);  Surgeon: Clarene Essex, MD;  Location: [facility] ENDOSCOPY;  Service: Endoscopy;  Laterality: N/A;  . ESOPHAGOGASTRODUODENOSCOPY N/A 11/08/2016   Procedure: ESOPHAGOGASTRODUODENOSCOPY (EGD);  Surgeon: [name], MD;  Location: [facility] ENDOSCOPY;  Service: Endoscopy;  Laterality: N/A;  . HOT HEMOSTASIS N/A 11/05/2014   Procedure: HOT HEMOSTASIS (ARGON PLASMA COAGULATION/BICAP);  Surgeon: Clarene Essex, MD;  Location: [facility] ENDOSCOPY;  Service: Endoscopy;  Laterality: N/A;  . NM MYOCAR PERF WALL  MOTION  11/24/2007   normal  . PERMANENT PACEMAKER INSERTION  10/04/2012   [employer]  . PERMANENT PACEMAKER INSERTION N/A 10/12/2011   Procedure: PERMANENT PACEMAKER INSERTION;  Surgeon: [name], MD;  Location: [place] CATH LAB;  Service: Cardiovascular;  Laterality: N/A;  . REPLACEMENT TOTAL KNEE    . SAVORY DILATION N/A 08/22/2013   Procedure: SAVORY DILATION;  Surgeon: [name], MD;  Location: [facility] ENDOSCOPY;  Service: Endoscopy;  Laterality: N/A;  . SHOULDER SURGERY    . US ECHOCARDIOGRAPHY  02/01/2011   LA is mod-severely dilated,AOV & root sclerotic,ca+ AOV leaflets       Home Medications    Prior to Admission medications   Medication Sig Start Date End Date Taking? Authorizing Provider  acetaminophen (TYLENOL) 650 MG CR tablet Take 650 mg by mouth every 8 (eight) hours as needed for pain.    [provider]  allopurinol (ZYLOPRIM) 100 MG tablet Take 2 tablets (200 mg total) by mouth daily. 11/09/16   Theodis Blaze, MD  COLCRYS 0.6 MG tablet Take 0.6 mg by mouth daily as needed (gout).  03/01/13   [provider]  Docusate Calcium (STOOL SOFTENER PO) Take 2 tablets by mouth at bedtime.     [provider]  dutasteride (AVODART) 0.5 MG capsule Take 0.5 mg by mouth daily.    [provider]  ELIQUIS 5 MG TABS tablet Take 5 mg by mouth 2 (two) times daily.  01/29/17   [provider]  escitalopram (LEXAPRO) 5 MG tablet Take 1 tablet (5 mg total) by mouth at bedtime. 11/09/16   Theodis Blaze, MD  ferrous sulfate 325 (65 FE) MG tablet Take 325 mg by mouth daily with breakfast.    [provider]  furosemide (LASIX) 40 MG tablet Take 2 tablets (80 mg total) by mouth 2 (two) times daily for 14 days. Take extra 40 mg midday x 4 days only 09/27/17 10/11/17  Annita Brod, MD  hydrOXYzine (ATARAX/VISTARIL) 25 MG tablet TAKE 1 TABLET BY MOUTH EVERY 6 HOURS AS NEEDED FOR ITCHING 07/07/17   Croitoru, Mihai, MD  loratadine (CLARITIN) 10 MG tablet Take 10 mg by mouth daily.    [provider]  mirtazapine (REMERON) 15 MG tablet Take 15 mg by mouth at bedtime.    [provider]  Multiple Vitamin (MULTIVITAMIN WITH  MINERALS) TABS tablet Take 1 tablet by mouth daily.    [provider]  Multiple Vitamins-Minerals (PRESERVISION AREDS 2) CAPS Take 1 capsule by mouth daily.    [provider]  nitroGLYCERIN (NITROSTAT) 0.4 MG SL tablet Place 0.4 mg under the tongue every 5 (five) minutes as needed. Chest pain    [provider]  Omega-3 Fatty Acids (OMEGA-3 PO) Take 1 capsule by mouth 2 (two) times daily.    [provider]  pantoprazole (PROTONIX) 40 MG tablet Take 1 tablet (40 mg total) by mouth daily. 11/10/16   Theodis Blaze, MD  potassium chloride (K-DUR) 10 MEQ tablet Take 1 tablet (10 mEq total) by mouth daily. Patient taking differently: Take 10 mEq by mouth 2 (two) times daily.  03/17/17   Croitoru, Mihai, MD  rOPINIRole (REQUIP) 3 MG tablet Take 1 tablet by mouth daily. Take 1 tab daily 08/02/15   [provider]  Tamsulosin HCl (FLOMAX) 0.4 MG CAPS Take 0.4 mg by mouth daily after breakfast.    [provider]  traMADol (ULTRAM) 50 MG tablet Take 100 mg by  mouth 2 (two) times daily. For pain     [provider]    Family History Family History  Problem Relation Age of Onset  . Cancer Mother   . Heart attack Father   . Diabetes Brother     Social History Social History   Tobacco Use  . Smoking status: Never Smoker  . Smokeless tobacco: Never Used  Substance Use Topics  . Alcohol use: No  . Drug use: No     Allergies   Hydrocodone-acetaminophen; Vicodin [hydrocodone-acetaminophen]; and Hydrocodone   Review of Systems Review of Systems  Constitutional: Positive for fatigue. Negative for chills, diaphoresis and fever.  HENT: Negative for congestion and rhinorrhea.   Eyes: Negative for visual disturbance.  Respiratory: Positive for shortness of breath. Negative for cough, sputum production, chest tightness, wheezing and stridor.   Cardiovascular: Positive for leg swelling. Negative for chest pain, palpitations, claudication  and syncope.  Gastrointestinal: Negative for abdominal pain, constipation, diarrhea, nausea and vomiting.  Genitourinary: Positive for decreased urine volume. Negative for dysuria and flank pain.  Musculoskeletal: Negative for back pain, neck pain and neck stiffness.  Neurological: Negative for seizures, light-headedness and numbness.  Psychiatric/Behavioral: Negative for agitation.  All other systems reviewed and are negative.    Physical Exam Updated Vital Signs BP (!) 120/58   Pulse (!) 58   Temp 97.7 F (36.5 C)   Resp 18   Ht 5\' 7"  (1.702 m)   Wt 94.3 kg (208 lb)   SpO2 100%   BMI 32.58 kg/m   Physical Exam  Constitutional: He is oriented to person, place, and time. He appears well-developed and well-nourished. No distress.  HENT:  Head: Normocephalic and atraumatic.  Mouth/Throat: Oropharynx is clear and moist. No oropharyngeal exudate.  Eyes: Conjunctivae and EOM are normal. Pupils are equal, round, and reactive to light.  Neck: Normal range of motion.  Cardiovascular: Normal rate and intact distal pulses.  No murmur heard. Pulmonary/Chest: Effort normal. No respiratory distress. He has rales. He exhibits no tenderness.  Abdominal: Soft. Bowel sounds are normal. He exhibits no distension. There is no tenderness.  Musculoskeletal: He exhibits edema. He exhibits no tenderness.  Lymphadenopathy:    He has no cervical adenopathy.  Neurological: He is alert and oriented to person, place, and time. No sensory deficit. He exhibits normal muscle tone.  Skin: He is not diaphoretic. No erythema. No pallor.  Psychiatric: He has a normal mood and affect.  Nursing note and vitals reviewed.    ED Treatments / Results  Labs (all labs ordered are listed, but only abnormal results are displayed) Labs Reviewed  BASIC METABOLIC PANEL - Abnormal; Notable for the following components:      Result Value   Chloride 97 (*)    BUN 53 (*)    Creatinine, Ser 1.37 (*)    GFR calc non  Af Amer 46 (*)    GFR calc Af Amer 53 (*)    All other components within normal limits  CBC - Abnormal; Notable for the following components:   WBC 3.2 (*)    RBC 2.64 (*)    Hemoglobin 9.2 (*)    HCT 28.7 (*)    MCV 108.7 (*)    MCH 34.8 (*)    RDW 19.2 (*)    Platelets 120 (*)    All other components within normal limits  BRAIN NATRIURETIC PEPTIDE - Abnormal; Notable for the following components:   B Natriuretic Peptide 842.8 (*)  All other components within normal limits  URINALYSIS, ROUTINE W REFLEX MICROSCOPIC - Abnormal; Notable for the following components:   Color, Urine STRAW (*)    All other components within normal limits  I-STAT TROPONIN, ED - Abnormal; Notable for the following components:   Troponin i, poc 0.11 (*)    All other components within normal limits  URINE CULTURE  MAGNESIUM  BASIC METABOLIC PANEL  CBC  TROPONIN I  TROPONIN I  TROPONIN I    EKG  EKG Interpretation None     ED ECG REPORT   Date: 09/30/2017  Rate: 60  Rhythm: PAced  QRS Axis: indeterminate  Intervals: paced  ST/T Wave abnormalities: normal  Conduction Disutrbances:paced  Narrative Interpretation:   Old EKG Reviewed: now paced  I have personally reviewed the EKG tracing and agree with the computerized printout as noted.     Radiology Dg Chest 2 View  Result Date: 09/30/2017 CLINICAL DATA:  84 y/o M; congestive heart failure. Shortness of breath. History of coronary artery disease, MI, cardiomyopathy, OSA, AFib, pulmonary hypertension. EXAM: CHEST  2 VIEW COMPARISON:  09/26/2017 chest radiograph FINDINGS: Stable cardiomegaly given projection and technique. Aortic atherosclerosis with calcification. Single lead pacemaker. Mild reticular opacities compatible with interstitial pulmonary edema. No consolidation, effusion, or pneumothorax. Mild degenerative changes of thoracic spine. Right rotator cuff repair anchors noted. IMPRESSION: Stable cardiomegaly and mild interstitial  pulmonary edema. Electronically Signed   By: Kristine Garbe M.D.   On: 09/30/2017 15:56    Procedures Procedures (including critical care time)  CRITICAL CARE Performed by: Gwenyth Allegra Juanito Gonyer Total critical care time: 35 minutes Critical care time was exclusive of separately billable procedures and treating other patients. CHF exacerbation with positive troponin requiring IV diuretics and admission. Critical care was necessary to treat or prevent imminent or life-threatening deterioration. Critical care was time spent personally by me on the following activities: development of treatment plan with patient and/or surrogate as well as nursing, discussions with consultants, evaluation of patient's response to treatment, examination of patient, obtaining history from patient or surrogate, ordering and performing treatments and interventions, ordering and review of laboratory studies, ordering and review of radiographic studies, pulse oximetry and re-evaluation of patient's condition.   Medications Ordered in ED Medications  allopurinol (ZYLOPRIM) tablet 200 mg (not administered)  docusate sodium (COLACE) capsule 100 mg (100 mg Oral Given 09/30/17 2347)  dutasteride (AVODART) capsule 0.5 mg (not administered)  apixaban (ELIQUIS) tablet 5 mg (not administered)  escitalopram (LEXAPRO) tablet 5 mg (5 mg Oral Given 09/30/17 2347)  ferrous sulfate tablet 325 mg (not administered)  loratadine (CLARITIN) tablet 10 mg (not administered)  mirtazapine (REMERON) tablet 15 mg (15 mg Oral Given 09/30/17 2347)  multivitamin with minerals tablet 1 tablet (not administered)  nitroGLYCERIN (NITROSTAT) SL tablet 0.4 mg (not administered)  pantoprazole (PROTONIX) EC tablet 40 mg (not administered)  potassium chloride (K-DUR,KLOR-CON) CR tablet 10 mEq (not administered)  rOPINIRole (REQUIP) tablet 3 mg (not administered)  tamsulosin (FLOMAX) capsule 0.4 mg (not administered)  traMADol (ULTRAM) tablet  100 mg (not administered)  ondansetron (ZOFRAN) tablet 4 mg (not administered)    Or  ondansetron (ZOFRAN) injection 4 mg (not administered)  furosemide (LASIX) injection 80 mg (not administered)  furosemide (LASIX) injection 80 mg (80 mg Intravenous Given 09/30/17 2028)     Initial Impression / Assessment and Plan / ED Course  I have reviewed the triage vital signs and the nursing notes.  Pertinent labs & imaging results that were available  during my care of the patient were reviewed by me and considered in my medical decision making (see chart for details).     SADIEL MOTA is a 84 y.o. male with a past medical history significant for CAD with MI, atrial fibrillation, CHF with pacemaker, hyperlipidemia, and pulmonary hypertension who was recently discharged 4 days ago for CHF exacerbation who presents with worsening exertional shortness of breath, fatigue, and scrotal swelling.  Patient reports that when he was discharged several days ago he was feeling better but over the last 2 days and specifically today, his symptoms worsen.  He reports that he cannot walk very far without getting very winded and short of breath.  This is how he felt before his last CHF exacerbation.  He reports that he has had recurrence of severe scrotal swelling although it is not as bad as it was during his recent admission.  He reports minimal pain down his scrotum and denies any testicle tenderness.  He denies any difficulty with urination but he does say that despite increased diuretics, he feels his urine has overall decreased.  He denies any chest pain, palpitations or syncopal episodes.  He denies any fevers, chills, congestion, or cough.  He denies any abdominal pain, constipation, or diarrhea.  He denies other complaints.  On exam, patient has a swollen scrotum and penis.  Patient had no tenderness and no erythema.  Doubt Fournier's or other infection in the scrotum.  Based on the nontender testicles, do not  feel patient has torsion but rather has fluid overload with anasarca of the genital area.  Patient's lungs had crackles in the bases bilaterally.  Chest was nontender.  Abdomen nontender.  Patient was placed on nasal cannula oxygen for hypoxia which he reports he does not use at home.  Patient will screen laboratory testing however clinically I suspect fluid overload recurrence.  Patient will likely admission given his nasal cannula oxygen dependence at this moment and his continued symptoms.  Anticipate assessment after lab testing.   Laboratory testing shows evidence of CHF exacerbation and fluid overload contributing to symptoms.  BNP is similarly elevated at 842.  X-ray shows similar cardiomegaly and interstitial pulmonary edema.  Troponin is elevated.  Due to patient's CHF exacerbation, he will be given Lasix and admitted for further management.  Patient admitted in stable condition for further management.    Final Clinical Impressions(s) / ED Diagnoses   Final diagnoses:  Acute on chronic congestive heart failure, unspecified heart failure type [facility])    ED Discharge Orders    None      Clinical Impression: 1. Acute on chronic congestive heart failure, unspecified heart failure type ([place])     Disposition: Admit  This note was prepared with assistance of Dragon voice recognition software. Occasional wrong-word or sound-a-like substitutions may have occurred due to the inherent limitations of voice recognition software.      Mikhala Kenan, Gwenyth Allegra, MD 09/30/17 2352

## 2017-09-30 NOTE — Progress Notes (Signed)
Pt is on NIV at this time tolerating it well. Settings per home regimen. humidity provided

## 2017-09-30 NOTE — ED Notes (Signed)
Pt given [place] sandwich and sprite per EDP.

## 2017-09-30 NOTE — Telephone Encounter (Signed)
Per chart review, patient went to ED today

## 2017-09-30 NOTE — H&P (Signed)
History and Physical    [name] WCH:[id number] DOB: Jan 28, 1932 DOA: 09/30/2017  PCP: [name], MD  Patient coming from: Home.  Chief Complaint: Shortness of breath.  Scrotal edema.  HPI: [name] is a 84 y.o. male with history of systolic CHF last EF measured and 2D echo was 25-30% on September 21, 2017, history of A. fib with slow ventricular response with pacemaker placement, sleep apnea, chronic kidney disease, pancytopenia, gout who was just recently discharged from hospital after being admitted for CHF on September 27, 2017, 3 days ago presents to the ER because of shortness of breath.  Patient states he has been compliant with this Lasix dose which was 80 mg twice daily and in the noontime patient also take additional 40 mg.  Despite taking which patient was getting more short of breath on exertion denies any chest pain has noticed increased scrotal edema.  Denies any productive cough fever or chills.  ED Course: In the ER on exam patient is mildly short of breath with basilar crackles chest x-ray showed mild congestion BNP was 842 troponin was 0.1 EKG shows ventricular paced rhythm.  Patient also has scrotal edema on exam.  Patient was given 80 mg's IV Lasix and admitted for acute on chronic systolic heart failure.  Review of Systems: As per HPI, rest all negative.   Past Medical History:  Diagnosis Date  . Coronary artery disease   . Diverticulitis   . Gout   . Hyperlipidemia   . MI (myocardial infarction) ([place])   . Nonischemic cardiomyopathy (HCC)    mild  . OSA on CPAP   . Permanent atrial fibrillation ([place])   . Pulmonary hypertension ([place])     Past Surgical History:  Procedure Laterality Date  . CARDIAC CATHETERIZATION  11/07/2008   nonischemic cardiomyopathy,pulmonary hypertension  . COLOSTOMY    . COLOSTOMY REVERSAL    . CORONARY ANGIOPLASTY  06/01/1999   successful to ostium of the first diagonal  . ESOPHAGOGASTRODUODENOSCOPY N/A 08/22/2013   Procedure: ESOPHAGOGASTRODUODENOSCOPY (EGD);  Surgeon: [name], MD;  Location: [facility] ENDOSCOPY;  Service: Endoscopy;  Laterality: N/A;  h/p in file cabinet, jackie  . ESOPHAGOGASTRODUODENOSCOPY N/A 11/05/2014   Procedure: ESOPHAGOGASTRODUODENOSCOPY (EGD);  Surgeon: Clarene Essex, MD;  Location: [facility] ENDOSCOPY;  Service: Endoscopy;  Laterality: N/A;  . ESOPHAGOGASTRODUODENOSCOPY N/A 11/08/2016   Procedure: ESOPHAGOGASTRODUODENOSCOPY (EGD);  Surgeon: [name], MD;  Location: [facility] ENDOSCOPY;  Service: Endoscopy;  Laterality: N/A;  . HOT HEMOSTASIS N/A 11/05/2014   Procedure: HOT HEMOSTASIS (ARGON PLASMA COAGULATION/BICAP);  Surgeon: Clarene Essex, MD;  Location: [facility] ENDOSCOPY;  Service: Endoscopy;  Laterality: N/A;  . NM MYOCAR PERF WALL MOTION  11/24/2007   normal  . PERMANENT PACEMAKER INSERTION  10/04/2012   [employer]  . PERMANENT PACEMAKER INSERTION N/A 10/12/2011   Procedure: PERMANENT PACEMAKER INSERTION;  Surgeon: [name], MD;  Location: [facility] CATH LAB;  Service: Cardiovascular;  Laterality: N/A;  . REPLACEMENT TOTAL KNEE    . SAVORY DILATION N/A 08/22/2013   Procedure: SAVORY DILATION;  Surgeon: [name], MD;  Location: [facility] ENDOSCOPY;  Service: Endoscopy;  Laterality: N/A;  . SHOULDER SURGERY    . US ECHOCARDIOGRAPHY  02/01/2011   LA is mod-severely dilated,AOV & root sclerotic,ca+ AOV leaflets     reports that  has never smoked. he has never used smokeless tobacco. He reports that he does not drink alcohol or use drugs.  Allergies  Allergen Reactions  . Hydrocodone-Acetaminophen Itching  .  Vicodin [Hydrocodone-Acetaminophen] Nausea And Vomiting  . Hydrocodone Nausea Only    Family History  Problem Relation Age of Onset  . Cancer Mother   . Heart attack Father   . Diabetes Brother     Prior to Admission medications   Medication Sig Start Date End Date Taking? Authorizing Provider  acetaminophen (TYLENOL) 650 MG CR tablet Take 650 mg by mouth every 8 (eight) hours as  needed for pain.    [provider]  allopurinol (ZYLOPRIM) 100 MG tablet Take 2 tablets (200 mg total) by mouth daily. 11/09/16   Theodis Blaze, MD  COLCRYS 0.6 MG tablet Take 0.6 mg by mouth daily as needed (gout).  03/01/13   [provider]  Docusate Calcium (STOOL SOFTENER PO) Take 2 tablets by mouth at bedtime.     [provider]  dutasteride (AVODART) 0.5 MG capsule Take 0.5 mg by mouth daily.    [provider]  ELIQUIS 5 MG TABS tablet Take 5 mg by mouth 2 (two) times daily.  01/29/17   [provider]  escitalopram (LEXAPRO) 5 MG tablet Take 1 tablet (5 mg total) by mouth at bedtime. 11/09/16   Theodis Blaze, MD  ferrous sulfate 325 (65 FE) MG tablet Take 325 mg by mouth daily with breakfast.    [provider]  furosemide (LASIX) 40 MG tablet Take 2 tablets (80 mg total) by mouth 2 (two) times daily for 14 days. Take extra 40 mg midday x 4 days only 09/27/17 10/11/17  Annita Brod, MD  hydrOXYzine (ATARAX/VISTARIL) 25 MG tablet TAKE 1 TABLET BY MOUTH EVERY 6 HOURS AS NEEDED FOR ITCHING 07/07/17   Croitoru, Mihai, MD  loratadine (CLARITIN) 10 MG tablet Take 10 mg by mouth daily.    [provider]  mirtazapine (REMERON) 15 MG tablet Take 15 mg by mouth at bedtime.    [provider]  Multiple Vitamin (MULTIVITAMIN WITH MINERALS) TABS tablet Take 1 tablet by mouth daily.    [provider]  Multiple Vitamins-Minerals (PRESERVISION AREDS 2) CAPS Take 1 capsule by mouth daily.    [provider]  nitroGLYCERIN (NITROSTAT) 0.4 MG SL tablet Place 0.4 mg under the tongue every 5 (five) minutes as needed. Chest pain    [provider]  Omega-3 Fatty Acids (OMEGA-3 PO) Take 1 capsule by mouth 2 (two) times daily.    [provider]  pantoprazole (PROTONIX) 40 MG tablet Take 1 tablet (40 mg total) by mouth daily. 11/10/16   Theodis Blaze, MD  potassium chloride (K-DUR) 10 MEQ tablet Take 1  tablet (10 mEq total) by mouth daily. Patient taking differently: Take 10 mEq by mouth 2 (two) times daily.  03/17/17   Croitoru, Mihai, MD  rOPINIRole (REQUIP) 3 MG tablet Take 1 tablet by mouth daily. Take 1 tab daily 08/02/15   [provider]  Tamsulosin HCl (FLOMAX) 0.4 MG CAPS Take 0.4 mg by mouth daily after breakfast.    [provider]  traMADol (ULTRAM) 50 MG tablet Take 100 mg by mouth 2 (two) times daily. For pain     [provider]    Physical Exam: Vitals:   09/30/17 1845 09/30/17 2030 09/30/17 2100 09/30/17 2157  BP: 128/60 (!) 119/53 115/66 (!) 109/50  Pulse: 60 60 (!) 58 (!) 56  Resp: (!) 21 17 (!) 21   Temp:    97.6 F (36.4 C)  TempSrc:    Oral  SpO2: 100% 100% 99%  92%  Weight:    93.7 kg (206 lb 8 oz)  Height:          Constitutional: Moderately built and nourished. Vitals:   09/30/17 1845 09/30/17 2030 09/30/17 2100 09/30/17 2157  BP: 128/60 (!) 119/53 115/66 (!) 109/50  Pulse: 60 60 (!) 58 (!) 56  Resp: (!) 21 17 (!) 21   Temp:    97.6 F (36.4 C)  TempSrc:    Oral  SpO2: 100% 100% 99% 92%  Weight:    93.7 kg (206 lb 8 oz)  Height:       Eyes: Anicteric no pallor. ENMT: No discharge from the ears eyes nose or mouth. Neck: No mass felt.  JVD elevated, no neck rigidity. Respiratory: No rhonchi bilateral basilar crepitations present. Cardiovascular: S1-S2 heard no murmurs appreciated. Abdomen: Soft nontender bowel sounds present mildly distended.  Scrotal edema present. Musculoskeletal: Mild lower extremity edema. Skin: No rashes chronic skin changes. Neurologic: Alert awake oriented to time place and person.  Moves all extremities. Psychiatric: Appears normal.  Normal affect.   Labs on Admission: I have personally reviewed following labs and imaging studies  CBC: Recent Labs  Lab 09/26/17 0119 09/30/17 1542  WBC 3.9* 3.2*  HGB 8.6* 9.2*  HCT 26.6* 28.7*  MCV 108.1* 108.7*  PLT 128* 631*   Basic Metabolic  Panel: Recent Labs  Lab 09/26/17 0119 09/27/17 0210 09/30/17 1542  NA 140 142 141  K 4.2 3.7 3.6  CL 102 100* 97*  CO2 27 31 30   GLUCOSE 131* 107* 76  BUN 71* 56* 53*  CREATININE 1.79* 1.58* 1.37*  CALCIUM 8.7* 8.6* 8.9  MG  --  2.1 2.1   GFR: Estimated Creatinine Clearance: 44.6 mL/min (A) (by C-G formula based on SCr of 1.37 mg/dL (H)). Liver Function Tests: Recent Labs  Lab 09/26/17 0348  AST 60*  ALT 34  ALKPHOS 84  BILITOT 1.2  PROT 6.6  ALBUMIN 3.3*   No results for input(s): LIPASE, AMYLASE in the last 168 hours. No results for input(s): AMMONIA in the last 168 hours. Coagulation Profile: Recent Labs  Lab 09/26/17 0229  INR 1.57   Cardiac Enzymes: No results for input(s): CKTOTAL, CKMB, CKMBINDEX, TROPONINI in the last 168 hours. BNP (last 3 results) No results for input(s): PROBNP in the last 8760 hours. HbA1C: No results for input(s): HGBA1C in the last 72 hours. CBG: No results for input(s): GLUCAP in the last 168 hours. Lipid Profile: No results for input(s): CHOL, HDL, LDLCALC, TRIG, CHOLHDL, LDLDIRECT in the last 72 hours. Thyroid Function Tests: No results for input(s): TSH, T4TOTAL, FREET4, T3FREE, THYROIDAB in the last 72 hours. Anemia Panel: No results for input(s): VITAMINB12, FOLATE, FERRITIN, TIBC, IRON, RETICCTPCT in the last 72 hours. Urine analysis:    Component Value Date/Time   COLORURINE STRAW (A) 09/30/2017 1813   APPEARANCEUR CLEAR 09/30/2017 1813   LABSPEC 1.006 09/30/2017 1813   PHURINE 6.0 09/30/2017 1813   GLUCOSEU NEGATIVE 09/30/2017 1813   HGBUR NEGATIVE 09/30/2017 1813   BILIRUBINUR NEGATIVE 09/30/2017 1813   KETONESUR NEGATIVE 09/30/2017 1813   PROTEINUR NEGATIVE 09/30/2017 1813   NITRITE NEGATIVE 09/30/2017 1813   LEUKOCYTESUR NEGATIVE 09/30/2017 1813   Sepsis Labs: @LABRCNTIP (procalcitonin:4,lacticidven:4) ) Recent Results (from the past 240 hour(s))  MRSA PCR Screening     Status: None   Collection Time:  09/27/17  9:10 AM  Result Value Ref Range Status   MRSA by PCR NEGATIVE NEGATIVE Final    Comment:  The GeneXpert MRSA Assay (FDA approved for NASAL specimens only), is one component of a comprehensive MRSA colonization surveillance program. It is not intended to diagnose MRSA infection nor to guide or monitor treatment for MRSA infections. Performed at [facility], [place] [address] [place], [place] [address]      Radiological Exams on Admission: Dg Chest 2 View  Result Date: 09/30/2017 CLINICAL DATA:  84 y/o M; congestive heart failure. Shortness of breath. History of coronary artery disease, MI, cardiomyopathy, OSA, AFib, pulmonary hypertension. EXAM: CHEST  2 VIEW COMPARISON:  09/26/2017 chest radiograph FINDINGS: Stable cardiomegaly given projection and technique. Aortic atherosclerosis with calcification. Single lead pacemaker. Mild reticular opacities compatible with interstitial pulmonary edema. No consolidation, effusion, or pneumothorax. Mild degenerative changes of thoracic spine. Right rotator cuff repair anchors noted. IMPRESSION: Stable cardiomegaly and mild interstitial pulmonary edema. Electronically Signed   By: Kristine Garbe M.D.   On: 09/30/2017 15:56    EKG: Independently reviewed.  Paced rhythm.  Assessment/Plan Active Problems:   Atrial fibrillation, persistent, slow VR   Restless leg syndrome   Nonischemic cardiomyopathy - coronaries by angiography 2010   OSA on CPAP   Pulmonary hypertension (HCC)   Acute on chronic systolic CHF (congestive heart failure) (HCC)   Chronic atrial fibrillation (HCC)   Pancytopenia (HCC)   CKD (chronic kidney disease), stage III (HCC)   CHF (congestive heart failure) ([place])    1. Acute on chronic systolic heart failure last EF measured was on September 21, 2017, 25-30% as per the discharging MD patient's EF after discussing with the cardiology was found to be around 45%.  At this time patient is placed on  Lasix 80 mg IV every 12.  Closely follow intake output metabolic panel and since troponin is elevated we will cycle cardiac markers.  2D echo also showed a pulmonary artery pressure of 111. 2. Chronic atrial fibrillation status post pacemaker placement for slow ventricular response.  Patient is on Eliquis which should be continued. 3. Sleep apnea on CPAP. 4. Pancytopenia appears to be present even during last admission.  Follow CBC. 5. History of gout.  No acute attack at this time. 6. Restless leg syndrome on Requip. 7. Chronic kidney disease creatinine appears to be at baseline.  Follow metabolic panel.   DVT prophylaxis: Eliquis. Code Status: Full code. Family Communication: Patient's wife. Disposition Plan: Home. Consults called: None. Admission status: Inpatient.   [name] MD [facility] Pager [phone].  If 7PM-7AM, please contact night-coverage [email or web address] Password [email or web address]  09/30/2017, 10:38 PM

## 2017-10-01 ENCOUNTER — Telehealth: Payer: Self-pay | Admitting: Physician Assistant

## 2017-10-01 DIAGNOSIS — I509 Heart failure, unspecified: Secondary | ICD-10-CM

## 2017-10-01 DIAGNOSIS — I481 Persistent atrial fibrillation: Secondary | ICD-10-CM

## 2017-10-01 DIAGNOSIS — N183 Chronic kidney disease, stage 3 (moderate): Secondary | ICD-10-CM

## 2017-10-01 LAB — BASIC METABOLIC PANEL
Anion gap: 14 (ref 5–15)
BUN: 47 mg/dL — ABNORMAL HIGH (ref 6–20)
CALCIUM: 8.4 mg/dL — AB (ref 8.9–10.3)
CO2: 29 mmol/L (ref 22–32)
CREATININE: 1.33 mg/dL — AB (ref 0.61–1.24)
Chloride: 95 mmol/L — ABNORMAL LOW (ref 101–111)
GFR calc Af Amer: 55 mL/min — ABNORMAL LOW (ref >60)
GFR, EST NON AFRICAN AMERICAN: 48 mL/min — AB (ref >60)
GLUCOSE: 89 mg/dL (ref 65–99)
Potassium: 3.4 mmol/L — ABNORMAL LOW (ref 3.5–5.1)
Sodium: 138 mmol/L (ref 135–145)

## 2017-10-01 LAB — CBC
HCT: 27.8 % — ABNORMAL LOW (ref 39.0–52.0)
Hemoglobin: 9.1 g/dL — ABNORMAL LOW (ref 13.0–17.0)
MCH: 35.8 pg — AB (ref 26.0–34.0)
MCHC: 32.7 g/dL (ref 30.0–36.0)
MCV: 109.4 fL — ABNORMAL HIGH (ref 78.0–100.0)
PLATELETS: 108 10*3/uL — AB (ref 150–400)
RBC: 2.54 MIL/uL — ABNORMAL LOW (ref 4.22–5.81)
RDW: 19.3 % — AB (ref 11.5–15.5)
WBC: 3.3 10*3/uL — ABNORMAL LOW (ref 4.0–10.5)

## 2017-10-01 LAB — URINE CULTURE: CULTURE: NO GROWTH

## 2017-10-01 LAB — TROPONIN I
TROPONIN I: 0.18 ng/mL — AB (ref <0.03)
Troponin I: 0.12 ng/mL (ref <0.03)
Troponin I: 0.12 ng/mL (ref <0.03)

## 2017-10-01 MED ORDER — SPIRONOLACTONE 25 MG PO TABS
25.0000 mg | ORAL_TABLET | Freq: Every day | ORAL | Status: DC
  Administered 2017-10-01 – 2017-10-02 (×2): 25 mg via ORAL
  Filled 2017-10-01 (×2): qty 1

## 2017-10-01 MED ORDER — DIPHENHYDRAMINE HCL 25 MG PO CAPS
25.0000 mg | ORAL_CAPSULE | Freq: Three times a day (TID) | ORAL | Status: DC | PRN
  Administered 2017-10-01 – 2017-10-02 (×4): 25 mg via ORAL
  Filled 2017-10-01 (×4): qty 1

## 2017-10-01 NOTE — Care Management Note (Signed)
Case Management Note  Patient Details  Name: [name] MRN: [id number] Date of Birth: 03/30/1932  Subjective/Objective:    CHF               Action/Plan: 10/01/2017 - Patient known to me from previous admission; CM will continue to follow for DCP; B Pennie Rushing  09/27/2017 - Action/Plan: Patient lives at home with spouse; SPQ:ZRAQTMA, [name] Him, MD; has private insurance with Medicare; pharmacy of choice is [facility]; DME - cane, walker, motorized wheelchair; Patient could benefit from a Disease Management Program for CHF; [place] choice offered, pt chose [facility]; [place] with [facility] called for arrangements. Mindi Slicker RN,MHA,BSN  Expected Discharge Date:     Possibly 10/04/2017             Expected Discharge Plan:  [place]  Discharge planning Services  CM Consult  HH Arranged:  PT [facility] Agency:  [facility] (formerly [facility]  Status of Service:  In process, will continue to follow  Sherrilyn Rist [phone] 10/01/2017, 8:04 AM

## 2017-10-01 NOTE — Progress Notes (Signed)
Patient ID: [name], male   DOB: 08/14/1931, 84 y.o.   MRN: [id number]                                                                PROGRESS NOTE                                                                                                                                                                                                             Patient Demographics:    [name], is a 84 y.o. male, DOB - 03/28/1932, BOF:[id number]  Admit date - 09/30/2017   Admitting Physician [name], MD  Outpatient Primary MD for the patient is [name], [name] Him, MD  LOS - 1  Outpatient Specialists:     Mihai Croitaru Shelva Majestic  Chief Complaint  Patient presents with  . testicle swelling       Brief Narrative   84 y.o. male with history of systolic CHF last EF measured and 2D echo was 25-30% on September 21, 2017, history of A. fib with slow ventricular response with pacemaker placement, sleep apnea, chronic kidney disease, pancytopenia, gout who was just recently discharged from hospital after being admitted for CHF on September 27, 2017, 3 days ago presents to the ER because of shortness of breath.  Patient states he has been compliant with this Lasix dose which was 80 mg twice daily and in the noontime patient also take additional 40 mg.  Despite taking which patient was getting more short of breath on exertion denies any chest pain has noticed increased scrotal edema.  Denies any productive cough fever or chills.  ED Course: In the ER on exam patient is mildly short of breath with basilar crackles chest x-ray showed mild congestion BNP was 842 troponin was 0.1 EKG shows ventricular paced rhythm.  Patient also has scrotal edema on exam.  Patient was given 80 mg's IV Lasix and admitted for acute on chronic systolic heart failure.     Subjective:    [name] today states that he primarly came in due to scrotal edema which has improved since yesterday. Pt is vague  on his dyspnea.  Pt doesn't describe orthopnea, pnd.  Might have had some weight gain since discharge and slight increase in edema.  Pt denies fever,  chills, cough, cp, palp, n/v, diarrhea.    His primary issues along w his scrotal edema is itching which he has had before.  Pt can't think of any new detergent, lotion, cream, or contact.      Assessment  & Plan :    Active Problems:   Atrial fibrillation, persistent, slow VR   Restless leg syndrome   Nonischemic cardiomyopathy - coronaries by angiography 2010   OSA on CPAP   Pulmonary hypertension (HCC)   Acute on chronic systolic CHF (congestive heart failure) (HCC)   Chronic atrial fibrillation (HCC)   Pancytopenia (HCC)   CKD (chronic kidney disease), stage III (HCC)   CHF (congestive heart failure) (HCC)   Acute on Chronic CHF (systolic, EF 40-98%, 08/27/1476, ?40-45% per cardiology) Cont Kdur 31meq po bid Cont Lasix 80mg  iv bid Start spironolactone 25mg  po qday Consider Entresto Follow I and O and daily weight Cardiology consult to assist with diuresis  Hypokalemia Starting spironolactone Check magnesium in am Check cmp in am  Troponin elevation Stable  Chronic Afib s/p pacer for slow ventricular response Cont Eliquis  Pulmonary hypertension Will need outpatient follow up  OSA Cont Cpap  Pancytopenia Check cbc in am  CKD stage 3, stable Check cmp in am  H/o Gout Cont allopurinol 200mg  po qday  RLS  Cont Requip 3mg  po qday  Bph Cont Flomax 0.4mg  po qhs  Gerd  Cont Protonix 40mg  po qday   DVT prophylaxis: Eliquis. Code Status: Full code. Family Communication:  w patient Disposition Plan: Home. Consults called:     Admission status: Inpatient.      Lab Results  Component Value Date   PLT 108 (L) 10/01/2017    Antibiotics  :  none  Anti-infectives (From admission, onward)   None        Objective:   Vitals:   09/30/17 2030 09/30/17 2100 09/30/17 2157 10/01/17 0500  BP: (!)  119/53 115/66 (!) 109/50 (!) 122/57  Pulse: 60 (!) 58 (!) 56 89  Resp: 17 (!) 21    Temp:   97.6 F (36.4 C) 98.6 F (37 C)  TempSrc:   Oral Oral  SpO2: 100% 99% 92%   Weight:   93.7 kg (206 lb 8 oz) 92.8 kg (204 lb 8 oz)  Height:        Wt Readings from Last 3 Encounters:  10/01/17 92.8 kg (204 lb 8 oz)  09/27/17 96.3 kg (212 lb 3.2 oz)  08/23/17 98 kg (216 lb)     Intake/Output Summary (Last 24 hours) at 10/01/2017 0734 Last data filed at 10/01/2017 2956 Gross per 24 hour  Intake 360 ml  Output 1525 ml  Net -1165 ml     Physical Exam  Awake Alert, Oriented X 3, No new F.N deficits, Normal affect [place].AT,PERRAL Supple Neck,No JVD, No cervical lymphadenopathy appriciated.  Symmetrical Chest wall movement, Good air movement bilaterally, slight decrease in bs at bases, trace crackles, no wheezing RRR, s1, s2, 1/6 sem apex +ve B.Sounds, Abd Soft, No tenderness, No organomegaly appriciated, No rebound - guarding or rigidity. No Cyanosis, Clubbing or edema, No new Rash or bruise  Very little scrotal edema.    Data Review:    CBC Recent Labs  Lab 09/26/17 0119 09/30/17 1542 10/01/17 0421  WBC 3.9* 3.2* 3.3*  HGB 8.6* 9.2* 9.1*  HCT 26.6* 28.7* 27.8*  PLT 128* 120* 108*  MCV 108.1* 108.7* 109.4*  MCH 35.0* 34.8* 35.8*  MCHC 32.3 32.1 32.7  RDW  19.3* 19.2* 19.3*    Chemistries  Recent Labs  Lab 09/26/17 0119 09/26/17 0348 09/27/17 0210 09/30/17 1542 10/01/17 0421  NA 140  --  142 141 138  K 4.2  --  3.7 3.6 3.4*  CL 102  --  100* 97* 95*  CO2 27  --  31 30 29   GLUCOSE 131*  --  107* 76 89  BUN 71*  --  56* 53* 47*  CREATININE 1.79*  --  1.58* 1.37* 1.33*  CALCIUM 8.7*  --  8.6* 8.9 8.4*  MG  --   --  2.1 2.1  --   AST  --  60*  --   --   --   ALT  --  34  --   --   --   ALKPHOS  --  84  --   --   --   BILITOT  --  1.2  --   --   --     ------------------------------------------------------------------------------------------------------------------ No results for input(s): CHOL, HDL, LDLCALC, TRIG, CHOLHDL, LDLDIRECT in the last 72 hours.  No results found for: HGBA1C ------------------------------------------------------------------------------------------------------------------ No results for input(s): TSH, T4TOTAL, T3FREE, THYROIDAB in the last 72 hours.  Invalid input(s): FREET3 ------------------------------------------------------------------------------------------------------------------ No results for input(s): VITAMINB12, FOLATE, FERRITIN, TIBC, IRON, RETICCTPCT in the last 72 hours.  Coagulation profile Recent Labs  Lab 09/26/17 0229  INR 1.57    No results for input(s): DDIMER in the last 72 hours.  Cardiac Enzymes Recent Labs  Lab 09/30/17 2258 10/01/17 0421  TROPONINI 0.18* 0.12*   ------------------------------------------------------------------------------------------------------------------    Component Value Date/Time   BNP 842.8 (H) 09/30/2017 1542    Inpatient Medications  Scheduled Meds: . allopurinol  200 mg Oral Daily  . apixaban  5 mg Oral BID  . docusate sodium  100 mg Oral QHS  . dutasteride  0.5 mg Oral Daily  . escitalopram  5 mg Oral QHS  . ferrous sulfate  325 mg Oral Q breakfast  . furosemide  80 mg Intravenous Q12H  . loratadine  10 mg Oral Daily  . mirtazapine  15 mg Oral QHS  . multivitamin with minerals  1 tablet Oral Daily  . pantoprazole  40 mg Oral Daily  . potassium chloride  10 mEq Oral BID  . rOPINIRole  3 mg Oral Daily  . tamsulosin  0.4 mg Oral QPC breakfast  . traMADol  100 mg Oral BID   Continuous Infusions: PRN Meds:.diphenhydrAMINE, nitroGLYCERIN, ondansetron **OR** ondansetron (ZOFRAN) IV  Micro Results Recent Results (from the past 240 hour(s))  MRSA PCR Screening     Status: None   Collection Time: 09/27/17  9:10 AM  Result Value Ref  Range Status   MRSA by PCR NEGATIVE NEGATIVE Final    Comment:        The GeneXpert MRSA Assay (FDA approved for NASAL specimens only), is one component of a comprehensive MRSA colonization surveillance program. It is not intended to diagnose MRSA infection nor to guide or monitor treatment for MRSA infections. Performed at [facility], [place] [address], [place], [place] [address]     Radiology Reports Dg Chest 2 View  Result Date: 09/30/2017 CLINICAL DATA:  84 y/o M; congestive heart failure. Shortness of breath. History of coronary artery disease, MI, cardiomyopathy, OSA, AFib, pulmonary hypertension. EXAM: CHEST  2 VIEW COMPARISON:  09/26/2017 chest radiograph FINDINGS: Stable cardiomegaly given projection and technique. Aortic atherosclerosis with calcification. Single lead pacemaker. Mild reticular opacities compatible with interstitial pulmonary  edema. No consolidation, effusion, or pneumothorax. Mild degenerative changes of thoracic spine. Right rotator cuff repair anchors noted. IMPRESSION: Stable cardiomegaly and mild interstitial pulmonary edema. Electronically Signed   By: Kristine Garbe M.D.   On: 09/30/2017 15:56   Dg Chest 2 View  Result Date: 09/26/2017 CLINICAL DATA:  Shortness of breath EXAM: CHEST  2 VIEW COMPARISON:  11/03/2016 FINDINGS: Left-sided pacing device as before. Cardiomegaly with central vascular congestion. No pleural effusion or focal pulmonary opacity. Aortic atherosclerosis. No pneumothorax. Degenerative changes at the right shoulder with postsurgical change at the humeral head. IMPRESSION: 1. Cardiomegaly with vascular congestion 2. No focal pulmonary infiltrate is seen Electronically Signed   By: Donavan Foil M.D.   On: 09/26/2017 01:41    Time Spent in minutes  30   Jani Gravel M.D on 10/01/2017 at 7:34 AM  Between 7am to 7pm - Pager - [phone]    After 7pm go to [email or web address] - password [facility]  [facility] -  Office   [phone]

## 2017-10-01 NOTE — Telephone Encounter (Signed)
He sees Dr. Claiborne Billings for sleep apnea so does he want to switch for sleep or Cardiology

## 2017-10-01 NOTE — Telephone Encounter (Signed)
Paged by answering service, Patient dose not like his medical doctor and wants to switch. He likes Dr. Radford Pax and wants to speak with her. Will route message to Dr. Radford Pax.

## 2017-10-02 DIAGNOSIS — D61818 Other pancytopenia: Secondary | ICD-10-CM

## 2017-10-02 LAB — COMPREHENSIVE METABOLIC PANEL
ALBUMIN: 2.9 g/dL — AB (ref 3.5–5.0)
ALT: 28 U/L (ref 17–63)
AST: 41 U/L (ref 15–41)
Alkaline Phosphatase: 76 U/L (ref 38–126)
Anion gap: 9 (ref 5–15)
BUN: 49 mg/dL — ABNORMAL HIGH (ref 6–20)
CHLORIDE: 95 mmol/L — AB (ref 101–111)
CO2: 35 mmol/L — AB (ref 22–32)
CREATININE: 1.47 mg/dL — AB (ref 0.61–1.24)
Calcium: 8.5 mg/dL — ABNORMAL LOW (ref 8.9–10.3)
GFR calc non Af Amer: 42 mL/min — ABNORMAL LOW (ref >60)
GFR, EST AFRICAN AMERICAN: 49 mL/min — AB (ref >60)
GLUCOSE: 88 mg/dL (ref 65–99)
Potassium: 4 mmol/L (ref 3.5–5.1)
SODIUM: 139 mmol/L (ref 135–145)
Total Bilirubin: 0.7 mg/dL (ref 0.3–1.2)
Total Protein: 6.3 g/dL — ABNORMAL LOW (ref 6.5–8.1)

## 2017-10-02 LAB — CBC
HCT: 27.8 % — ABNORMAL LOW (ref 39.0–52.0)
Hemoglobin: 8.7 g/dL — ABNORMAL LOW (ref 13.0–17.0)
MCH: 34.5 pg — AB (ref 26.0–34.0)
MCHC: 31.3 g/dL (ref 30.0–36.0)
MCV: 110.3 fL — ABNORMAL HIGH (ref 78.0–100.0)
PLATELETS: 105 10*3/uL — AB (ref 150–400)
RBC: 2.52 MIL/uL — AB (ref 4.22–5.81)
RDW: 19.3 % — ABNORMAL HIGH (ref 11.5–15.5)
WBC: 3.2 10*3/uL — AB (ref 4.0–10.5)

## 2017-10-02 MED ORDER — FUROSEMIDE 40 MG PO TABS: 80.0000 mg | ORAL_TABLET | ORAL | 0 refills | Status: DC

## 2017-10-02 MED ORDER — SPIRONOLACTONE 25 MG PO TABS: 25.0000 mg | ORAL_TABLET | Freq: Every day | ORAL | 0 refills | Status: DC

## 2017-10-02 MED ORDER — PRO-STAT SUGAR FREE PO LIQD: 30.0000 mL | Freq: Two times a day (BID) | ORAL | Status: DC

## 2017-10-02 MED ORDER — PRO-STAT SUGAR FREE PO LIQD: 30.0000 mL | Freq: Two times a day (BID) | ORAL | 0 refills | Status: DC

## 2017-10-02 NOTE — Discharge Summary (Addendum)
[name], is a 84 y.o. male  DOB April 03, 1932  MRN [id number].  Admission date:  09/30/2017  Admitting Physician  [name], MD  Discharge Date:  10/02/2017   Primary MD  [name], [name] Him, MD  Recommendations for primary care physician for things to follow:      Acute on Chronic CHF (systolic, EF 95-28%, 11/20/2438, ?40-45% per cardiology) Cont Kdur 7meq po bid Cont Lasix 80mg  po bid Cont spironolactone 25mg  po qday (started 10/01/2017) Consider Entresto Follow I and O and daily weight, pt instructed to contact pcp or cardiology if gains >3lbs F/u with cardiology in 1-2 weeks  Hypokalemia resolved Check cmp in 1-2 weeks  Troponin elevation Stable  Chronic Afib s/p pacer for slow ventricular response Cont Eliquis  Pulmonary hypertension Will need outpatient follow up, pcp to please consider pulmonary referal  Anemia,Pancytopenia Check cbc in 1-2 weeks  OSA Cont Cpap  CKD stage 3, stable Check cmp in 1-2 weeks  H/o Gout Cont allopurinol 200mg  po qday  RLS  Cont Requip 3mg  po qday  Bph Cont Flomax 0.4mg  po qhs  Gerd  Cont Protonix 40mg  po qday  Severe protein calorie malnutrition prostat 51mL po bid    Admission Diagnosis  swelling   Discharge Diagnosis  swelling       Active Problems:   Atrial fibrillation, persistent, slow VR   Restless leg syndrome   Nonischemic cardiomyopathy - coronaries by angiography 2010   OSA on CPAP   Pulmonary hypertension (HCC)   Acute on chronic systolic CHF (congestive heart failure) (HCC)   Chronic atrial fibrillation (HCC)   Pancytopenia (HCC)   CKD (chronic kidney disease), stage III (HCC)   CHF (congestive heart failure) ([place])      Past Medical History:  Diagnosis Date  . Arthritis    "feet" (09/30/2017)  . CHF (congestive heart failure) ([place])   . Chronic bronchitis ([place])   . CKD (chronic  kidney disease), stage III ([place])    [name] 09/30/2017  . Coronary artery disease   . Diverticulitis   . GERD (gastroesophageal reflux disease)   . Gout   . History of blood transfusion    "blood loss" (09/30/2017)  . Hyperlipidemia   . Hypertension   . MI (myocardial infarction) ([place]) ~ 2000   "light one"  . Nonischemic cardiomyopathy (HCC)    mild  . OSA on CPAP   . Permanent atrial fibrillation ([place])   . Presence of permanent cardiac pacemaker   . Pulmonary hypertension ([place])   . Restless legs     Past Surgical History:  Procedure Laterality Date  . APPENDECTOMY  12/2000   [name] 12/22/2010  . CARDIAC CATHETERIZATION  11/07/2008   nonischemic cardiomyopathy,pulmonary hypertension  . CATARACT EXTRACTION W/ INTRAOCULAR LENS  IMPLANT, BILATERAL Bilateral   . CIRCUMCISION  12/2005   [name] 12/22/2010  . COLOSTOMY  12/2000   [name] 12/22/2010  . COLOSTOMY REVERSAL  07/2001   [name] 12/22/2010  . CORONARY ANGIOPLASTY  06/01/1999  successful to ostium of the first diagonal  . ESOPHAGOGASTRODUODENOSCOPY N/A 08/22/2013   Procedure: ESOPHAGOGASTRODUODENOSCOPY (EGD);  Surgeon: [name], MD;  Location: [facility] ENDOSCOPY;  Service: Endoscopy;  Laterality: N/A;  h/p in file cabinet, jackie  . ESOPHAGOGASTRODUODENOSCOPY N/A 11/05/2014   Procedure: ESOPHAGOGASTRODUODENOSCOPY (EGD);  Surgeon: Clarene Essex, MD;  Location: [facility] ENDOSCOPY;  Service: Endoscopy;  Laterality: N/A;  . ESOPHAGOGASTRODUODENOSCOPY N/A 11/08/2016   Procedure: ESOPHAGOGASTRODUODENOSCOPY (EGD);  Surgeon: [name], MD;  Location: [facility] ENDOSCOPY;  Service: Endoscopy;  Laterality: N/A;  . HOT HEMOSTASIS N/A 11/05/2014   Procedure: HOT HEMOSTASIS (ARGON PLASMA COAGULATION/BICAP);  Surgeon: Clarene Essex, MD;  Location: [facility] ENDOSCOPY;  Service: Endoscopy;  Laterality: N/A;  . INSERT / REPLACE / REMOVE PACEMAKER    . JOINT REPLACEMENT    . MASS EXCISION Left    hand w/ulnar artery reconstruction/notes 12/22/2010  . NM MYOCAR PERF WALL  MOTION  11/24/2007   normal  . PERMANENT PACEMAKER INSERTION  10/04/2012   [employer]  . PERMANENT PACEMAKER INSERTION N/A 10/12/2011   Procedure: PERMANENT PACEMAKER INSERTION;  Surgeon: [name], MD;  Location: [facility] CATH LAB;  Service: Cardiovascular;  Laterality: N/A;  . REPLACEMENT TOTAL KNEE Bilateral 11/2006   right-left/notes 12/22/2010  . ROTATOR CUFF REPAIR Right 09/2004   [name] 12/22/2010  . SAVORY DILATION N/A 08/22/2013   Procedure: SAVORY DILATION;  Surgeon: [name], MD;  Location: [facility] ENDOSCOPY;  Service: Endoscopy;  Laterality: N/A;  . SHOULDER SURGERY Right    "fell off house; messed up 3 things in my arm"  . TONSILLECTOMY    . US ECHOCARDIOGRAPHY  02/01/2011   LA is mod-severely dilated,AOV & root sclerotic,ca+ AOV leaflets       HPI  from the history and physical done on the day of admission:     85 y.o.malewithhistory of systolic CHF last EF measured and 2D echo was 25-30% on September 21, 2017, history of A. fib with slow ventricular response with pacemaker placement, sleep apnea, chronic kidney disease, pancytopenia, gout who was just recently discharged from hospital after being admitted for CHF on September 27, 2017, 3 days ago presents to the ER because of shortness of breath. Patient states he has been compliant with this Lasix dose which was 80 mg twice daily and in the noontime patient also take additional 40 mg. Despite taking which patient was getting more short of breath on exertion denies any chest pain has noticed increased scrotal edema. Denies any productive cough fever or chills.  ED Course:In the ER on exam patient is mildly short of breath with basilar crackles chest x-ray showed mild congestion BNP was 842 troponin was 0.1 EKG shows ventricular paced rhythm. Patient also has scrotal edema on exam. Patient was given 80 mg's IV Lasix and admitted for acute on chronic systolic heart failure.        Hospital Course:      Pt  after discussion on why he was admitted stated he primary returned to hospital due to scrotal edema.  Pt was diuresed for ? Hydrocele.  He did not slight dyspnea and his CXR on admission showed stable cardiomegaly and mild interstitial pulmonary edema.  Pt was diuresed with lasix 80mg  iv bid and -1.7 L during this admission.  Spironolactone 25mg  po qday was added.  Troponin 0.12 during this admission but pt always has slight troponin leak and no siginficant EKG changes. I briefly discussed case with cardiology who thought that being as his chief complaint was the  scrotal edema that he could f/u with cardiology as outpatient in regards to his CHF.  Pt is very happy after having had diuresis and his scrotum is back to normal and dyspnea improved.  Pt requests to  be discharged to home today.       Follow UP  Follow-up Information    [name], [name] Him, MD Follow up in 1 week(s).   Specialty:  Internal Medicine Contact information: [place] [address] [phone]        Lelon Perla, MD Follow up in 1 week(s).   Specialty:  Cardiology Contact information: [address] [place] [address] [phone]            Consults obtained - none  Discharge Condition: stable  Diet and Activity recommendation: See Discharge Instructions below  Discharge Instructions         Discharge Medications     Allergies as of 10/02/2017      Reactions   Vicodin [hydrocodone-acetaminophen] Itching   Hydrocodone Itching      Medication List    TAKE these medications   acetaminophen 650 MG CR tablet Commonly known as:  TYLENOL Take 650 mg by mouth every 8 (eight) hours as needed for pain.   allopurinol 100 MG tablet Commonly known as:  ZYLOPRIM Take 2 tablets (200 mg total) by mouth daily.   colchicine 0.6 MG tablet Take 0.6 mg by mouth daily as needed (gout flare). Colcrys   dutasteride 0.5 MG capsule Commonly known as:  AVODART Take 0.5  mg by mouth daily.   ELIQUIS 5 MG Tabs tablet Generic drug:  apixaban Take 5 mg by mouth 2 (two) times daily.   escitalopram 5 MG tablet Commonly known as:  LEXAPRO Take 1 tablet (5 mg total) by mouth at bedtime.   feeding supplement (PRO-STAT SUGAR FREE 64) Liqd Take 30 mLs by mouth 2 (two) times daily.   ferrous sulfate 325 (65 FE) MG tablet Take 325 mg by mouth daily with breakfast.   furosemide 40 MG tablet Commonly known as:  LASIX Take 2 tablets (80 mg total) by mouth See admin instructions. Take 2 tablets (80 mg) by mouth twice daily, Take extra tablet (40 mg) midday x 4 days only (starting 09/27/17) What changed:    when to take this  additional instructions   hydrOXYzine 25 MG tablet Commonly known as:  ATARAX/VISTARIL TAKE 1 TABLET BY MOUTH EVERY 6 HOURS AS NEEDED FOR ITCHING What changed:  See the new instructions.   loratadine 10 MG tablet Commonly known as:  CLARITIN Take 10 mg by mouth daily.   mirtazapine 15 MG tablet Commonly known as:  REMERON Take 15 mg by mouth at bedtime.   multivitamin with minerals Tabs tablet Take 1 tablet by mouth daily.   nitroGLYCERIN 0.4 MG SL tablet Commonly known as:  NITROSTAT Place 0.4 mg under the tongue every 5 (five) minutes as needed for chest pain.   OMEGA-3 PO Take 1 capsule by mouth 2 (two) times daily.   pantoprazole 40 MG tablet Commonly known as:  PROTONIX Take 1 tablet (40 mg total) by mouth daily.   potassium chloride 10 MEQ tablet Commonly known as:  K-DUR Take 1 tablet (10 mEq total) by mouth daily. What changed:  when to take this   PRESERVISION AREDS 2 Caps Take 1 capsule by mouth daily.   rOPINIRole 3 MG tablet Commonly known as:  REQUIP Take 3 mg by mouth at bedtime.  spironolactone 25 MG tablet Commonly known as:  ALDACTONE Take 1 tablet (25 mg total) by mouth daily. Start taking on:  10/03/2017   STOOL SOFTENER PO Take 2 tablets by mouth at bedtime.   tamsulosin 0.4 MG Caps  capsule Commonly known as:  FLOMAX Take 0.4 mg by mouth daily after breakfast.   traMADol 50 MG tablet Commonly known as:  ULTRAM Take 100 mg by mouth 2 (two) times daily. For pain       Major procedures and Radiology Reports - PLEASE review detailed and final reports for all details, in brief -     Dg Chest 2 View  Result Date: 09/30/2017 CLINICAL DATA:  84 y/o M; congestive heart failure. Shortness of breath. History of coronary artery disease, MI, cardiomyopathy, OSA, AFib, pulmonary hypertension. EXAM: CHEST  2 VIEW COMPARISON:  09/26/2017 chest radiograph FINDINGS: Stable cardiomegaly given projection and technique. Aortic atherosclerosis with calcification. Single lead pacemaker. Mild reticular opacities compatible with interstitial pulmonary edema. No consolidation, effusion, or pneumothorax. Mild degenerative changes of thoracic spine. Right rotator cuff repair anchors noted. IMPRESSION: Stable cardiomegaly and mild interstitial pulmonary edema. Electronically Signed   By: Kristine Garbe M.D.   On: 09/30/2017 15:56   Dg Chest 2 View  Result Date: 09/26/2017 CLINICAL DATA:  Shortness of breath EXAM: CHEST  2 VIEW COMPARISON:  11/03/2016 FINDINGS: Left-sided pacing device as before. Cardiomegaly with central vascular congestion. No pleural effusion or focal pulmonary opacity. Aortic atherosclerosis. No pneumothorax. Degenerative changes at the right shoulder with postsurgical change at the humeral head. IMPRESSION: 1. Cardiomegaly with vascular congestion 2. No focal pulmonary infiltrate is seen Electronically Signed   By: Donavan Foil M.D.   On: 09/26/2017 01:41    Micro Results     Recent Results (from the past 240 hour(s))  MRSA PCR Screening     Status: None   Collection Time: 09/27/17  9:10 AM  Result Value Ref Range Status   MRSA by PCR NEGATIVE NEGATIVE Final    Comment:        The GeneXpert MRSA Assay (FDA approved for NASAL specimens only), is one  component of a comprehensive MRSA colonization surveillance program. It is not intended to diagnose MRSA infection nor to guide or monitor treatment for MRSA infections. Performed at [facility], [place] [address], [place], [place] [address]   Urine culture     Status: None   Collection Time: 09/30/17  5:54 PM  Result Value Ref Range Status   Specimen Description URINE, RANDOM  Final   Special Requests NONE  Final   Culture   Final    NO GROWTH Performed at [facility], [place] [address] [place], [place] [address]    Report Status 10/01/2017 FINAL  Final       Today   Subjective    Aulden Calise today has no scrotal swelling. He is walking around w/o assistance.  no headache,no chest, no sob, no abdominal pain,no new weakness tingling or numbness, feels much better wants to go home today.    Objective   Blood pressure (!) 108/58, pulse 60, temperature (!) 97.4 F (36.3 C), temperature source Oral, resp. rate 18, height 5\' 7"  (1.702 m), weight 92.3 kg (203 lb 8 oz), SpO2 95 %.   Intake/Output Summary (Last 24 hours) at 10/02/2017 1231 Last data filed at 10/02/2017 0700 Gross per 24 hour  Intake 720 ml  Output 1100 ml  Net -380 ml    Exam Awake  Alert, Oriented x 3, No new F.N deficits, Normal affect [place].AT,PERRAL Supple Neck,No JVD, No cervical lymphadenopathy appriciated.  Symmetrical Chest wall movement, Good air movement bilaterally, CTAB RRR,No Gallops,Rubs or new Murmurs, No Parasternal Heave +ve B.Sounds, Abd Soft, Non tender, No organomegaly appriciated, No rebound -guarding or rigidity. No Cyanosis, Clubbing or edema, No new Rash or bruise  No scrotal edema today   Data Review   CBC w Diff:  Lab Results  Component Value Date   WBC 3.2 (L) 10/02/2017   HGB 8.7 (L) 10/02/2017   HCT 27.8 (L) 10/02/2017   PLT 105 (L) 10/02/2017   LYMPHOPCT 29 11/04/2008   MONOPCT 11 11/04/2008   EOSPCT 2 11/04/2008   BASOPCT 1 11/04/2008    CMP:  Lab  Results  Component Value Date   NA 139 10/02/2017   NA 142 03/09/2017   K 4.0 10/02/2017   CL 95 (L) 10/02/2017   CO2 35 (H) 10/02/2017   BUN 49 (H) 10/02/2017   BUN 35 (H) 03/09/2017   CREATININE 1.47 (H) 10/02/2017   PROT 6.3 (L) 10/02/2017   ALBUMIN 2.9 (L) 10/02/2017   BILITOT 0.7 10/02/2017   ALKPHOS 76 10/02/2017   AST 41 10/02/2017   ALT 28 10/02/2017  .   Total Time in preparing paper work, data evaluation and todays exam - 71 minutes  Jani Gravel M.D on 10/02/2017 at 12:31 PM  [facility]   Office  [phone]

## 2017-10-02 NOTE — Progress Notes (Signed)
RT placed patient on CPAP HS. 2L O2 bleed in needed. Patient tolerating well at this time. 

## 2017-10-02 NOTE — Progress Notes (Signed)
Pt has orders to be discharged. Discharge instructions given and pt has no additional questions at this time. Medication regimen reviewed and pt educated. Pt verbalized understanding and has no additional questions. Telemetry box removed. IV removed and site in good condition. Pt stable and waiting for transportation from daughter.

## 2017-10-03 ENCOUNTER — Other Ambulatory Visit: Payer: Self-pay | Admitting: *Deleted

## 2017-10-03 DIAGNOSIS — N179 Acute kidney failure, unspecified: Secondary | ICD-10-CM | POA: Diagnosis not present

## 2017-10-03 DIAGNOSIS — G9340 Encephalopathy, unspecified: Secondary | ICD-10-CM | POA: Diagnosis not present

## 2017-10-03 DIAGNOSIS — I5033 Acute on chronic diastolic (congestive) heart failure: Secondary | ICD-10-CM | POA: Diagnosis not present

## 2017-10-03 DIAGNOSIS — I2721 Secondary pulmonary arterial hypertension: Secondary | ICD-10-CM | POA: Diagnosis not present

## 2017-10-03 DIAGNOSIS — D61818 Other pancytopenia: Secondary | ICD-10-CM | POA: Diagnosis not present

## 2017-10-03 DIAGNOSIS — R0602 Shortness of breath: Secondary | ICD-10-CM | POA: Diagnosis not present

## 2017-10-03 DIAGNOSIS — R5381 Other malaise: Secondary | ICD-10-CM | POA: Diagnosis not present

## 2017-10-03 DIAGNOSIS — I480 Paroxysmal atrial fibrillation: Secondary | ICD-10-CM | POA: Diagnosis not present

## 2017-10-03 DIAGNOSIS — Z6832 Body mass index (BMI) 32.0-32.9, adult: Secondary | ICD-10-CM | POA: Diagnosis not present

## 2017-10-03 NOTE — Patient Outreach (Signed)
[facility]) Care Management  10/03/2017  ANDER WAMSER 02-06-1932 [id number]  EMMI- Heart Failure RED ON EMMI ALERT DAY#: 2 DATE: 09/30/17 RED ALERT: Any New problems? Yes New/worsening problems? Yes New swelling? Yes New/worsening shortness of breath? Yes Had diarrhea or felt sick to stomach? Yes Nausea or vomiting? Yes Fever or chills? Yes Other symptoms/problems? Yes   Outreach attempt #1 to patient. No answer. RN CM left HIPAA compliant message along with contact info.    Plan: RN CM will contact patient within one week.    [place] Bells, RN, BSN, MHA/MSL, [place] Telephonic Care Manager Coordinator [facility] Direct Phone: [phone] Toll Free: [phone] Fax: [phone]

## 2017-10-05 ENCOUNTER — Ambulatory Visit (INDEPENDENT_AMBULATORY_CARE_PROVIDER_SITE_OTHER): Payer: Medicare Other | Admitting: Podiatry

## 2017-10-05 DIAGNOSIS — Q828 Other specified congenital malformations of skin: Secondary | ICD-10-CM | POA: Diagnosis not present

## 2017-10-05 DIAGNOSIS — M79676 Pain in unspecified toe(s): Secondary | ICD-10-CM

## 2017-10-05 DIAGNOSIS — B351 Tinea unguium: Secondary | ICD-10-CM

## 2017-10-07 ENCOUNTER — Other Ambulatory Visit: Payer: Self-pay

## 2017-10-07 NOTE — Patient Outreach (Signed)
[facility]) Care Management  10/07/2017  [name] 09/06/1931 [id number]   EMMI- Heart Failure RED ON EMMI ALERT Day # 8 Date: 10/06/17 Red Alert Reason:  [name] to follow up appointment? No Why they didn't attend follow up appointment? I missed it  Telephone call to patient for EMMI red alert. Male answered stating patient not in.  CM contact information given and request for return call.    Plan: RN CM will send letter to attempt outreach and attempt patient again within 4 business days.   Jone Baseman, RN, MSN [facility] Care Management Care Management Coordinator Direct Line [phone] Toll Free: [phone]  Fax: [phone]

## 2017-10-09 NOTE — Progress Notes (Signed)
   SUBJECTIVE Patient presents to office today complaining of elongated, thickened nails that cause pain while ambulating in shoes. He is unable to trim his own nails. Patient is here for further evaluation and treatment.   Past Medical History:  Diagnosis Date  . Arthritis    "feet" (09/30/2017)  . CHF (congestive heart failure) ([place])   . Chronic bronchitis ([place])   . CKD (chronic kidney disease), stage III ([place])    [name] 09/30/2017  . Coronary artery disease   . Diverticulitis   . GERD (gastroesophageal reflux disease)   . Gout   . History of blood transfusion    "blood loss" (09/30/2017)  . Hyperlipidemia   . Hypertension   . MI (myocardial infarction) ([place]) ~ 2000   "light one"  . Nonischemic cardiomyopathy (HCC)    mild  . OSA on CPAP   . Permanent atrial fibrillation ([place])   . Presence of permanent cardiac pacemaker   . Pulmonary hypertension ([place])   . Restless legs     OBJECTIVE General Patient is awake, alert, and oriented x 3 and in no acute distress. Derm Skin is dry and supple bilateral. Negative open lesions or macerations. Remaining integument unremarkable. Nails are tender, long, thickened and dystrophic with subungual debris, consistent with onychomycosis, 1-5 bilateral. No signs of infection noted. Vasc  DP and PT pedal pulses palpable bilaterally. Temperature gradient within normal limits.  Neuro Epicritic and protective threshold sensation diminished bilaterally.  Musculoskeletal Exam No symptomatic pedal deformities noted bilateral. Muscular strength within normal limits.  ASSESSMENT 1. Onychodystrophic nails 1-5 bilateral with hyperkeratosis of nails.  2. Onychomycosis of nail due to dermatophyte bilateral 3. Pain in foot bilateral  PLAN OF CARE 1. Patient evaluated today.  2. Instructed to maintain good pedal hygiene and foot care.  3. Mechanical debridement of nails 1-5 bilaterally performed using a nail nipper. Filed with dremel without incident.    4. Return to clinic in 3 mos.    Edrick Kins, DPM [facility]  Dr. Edrick Kins, [place]                                        [place], [place] [address]                Office [phone]  Fax [phone]

## 2017-10-10 ENCOUNTER — Other Ambulatory Visit: Payer: Self-pay

## 2017-10-10 NOTE — Patient Outreach (Signed)
[name]) Care Management  10/10/2017  [name] 05-08-1932 [id number]   EMMI- Heart Failure RED ON EMMI ALERT Day # 8 Date:10/06/17 Red Alert Reason: [name] to follow up appointment? No Why they didn't attend follow up appointment? I missed it  Day #9 Date: 10/07/17 Red Alert Reason: New Worsening problems? Yes New worsening shortness of breath? Yes Questions about meds? yes   2nd telephone call to patient for EMMI red alert.  No answer.  HIPAA compliant voice message left.  Plan: RN CM will attempt patient again within 10 business days.  Jone Baseman, RN, MSN [facility] Care Management Care Management Coordinator Direct Line [phone] Toll Free: [phone]  Fax: [phone]

## 2017-10-12 DIAGNOSIS — M109 Gout, unspecified: Secondary | ICD-10-CM | POA: Diagnosis not present

## 2017-10-12 DIAGNOSIS — E7800 Pure hypercholesterolemia, unspecified: Secondary | ICD-10-CM | POA: Diagnosis not present

## 2017-10-12 DIAGNOSIS — I10 Essential (primary) hypertension: Secondary | ICD-10-CM | POA: Diagnosis not present

## 2017-10-12 DIAGNOSIS — R7301 Impaired fasting glucose: Secondary | ICD-10-CM | POA: Diagnosis not present

## 2017-10-12 DIAGNOSIS — I8312 Varicose veins of left lower extremity with inflammation: Secondary | ICD-10-CM | POA: Diagnosis not present

## 2017-10-12 DIAGNOSIS — Z125 Encounter for screening for malignant neoplasm of prostate: Secondary | ICD-10-CM | POA: Diagnosis not present

## 2017-10-12 DIAGNOSIS — I83892 Varicose veins of left lower extremities with other complications: Secondary | ICD-10-CM | POA: Diagnosis not present

## 2017-10-12 DIAGNOSIS — R82998 Other abnormal findings in urine: Secondary | ICD-10-CM | POA: Diagnosis not present

## 2017-10-13 ENCOUNTER — Ambulatory Visit (HOSPITAL_BASED_OUTPATIENT_CLINIC_OR_DEPARTMENT_OTHER): Payer: Medicare Other | Attending: Cardiovascular Disease

## 2017-10-13 ENCOUNTER — Telehealth: Payer: Self-pay | Admitting: Cardiovascular Disease

## 2017-10-13 NOTE — Telephone Encounter (Signed)
Patient difficult to understand. I believe the protective shield came off of their microwave about 6 weeks ago. His pacemaker has been remotely monitored since then. I have advised him to keep his PPM 6 inches + away from the microwave while it is operating. He verbalizes understanding.

## 2017-10-13 NOTE — Telephone Encounter (Signed)
Mr.Linney is calling because he has a pacemaker and found out two days ago. Had a person come in to check his Microwave and the protection shield that supposed to be on the door was not on the door . His concern is will the microwave affect him in any way or not . Please call

## 2017-10-14 DIAGNOSIS — N401 Enlarged prostate with lower urinary tract symptoms: Secondary | ICD-10-CM | POA: Diagnosis not present

## 2017-10-14 DIAGNOSIS — I861 Scrotal varices: Secondary | ICD-10-CM | POA: Diagnosis not present

## 2017-10-14 DIAGNOSIS — R351 Nocturia: Secondary | ICD-10-CM | POA: Diagnosis not present

## 2017-10-17 ENCOUNTER — Other Ambulatory Visit: Payer: Self-pay

## 2017-10-17 NOTE — Patient Outreach (Signed)
[facility]) Care Management  10/17/2017  [name] [birth year] [id number]   EMMI-Heart Failure RED ON EMMI ALERT Day #8 Date:10/06/17 Red Alert Reason: [name] to follow up appointment? No Why they didn't attend follow up appointment? I missed it  Day #9 Date: 10/07/17 Red Alert Reason: New Worsening problems? Yes New worsening shortness of breath? Yes Questions about meds? Yes  Day # 16 Date 10/14/17 Red Alert Reason: Know exactly which meds to take? No  Day #17 Date: 10/15/17 Red Alert Reason: Any new problems? yes New/worsening problems? Yes New Swelling? Yes   Telephone call to patient due to new EMMI red alerts.  No answer.  Unable to leave a message.  Plan: RN CM will wait patient return call.    Jone Baseman, RN, MSN [facility] Care Management Care Management Coordinator Direct Line [phone] Toll Free: [phone]  Fax: [phone]

## 2017-10-19 ENCOUNTER — Ambulatory Visit: Payer: Self-pay

## 2017-10-19 DIAGNOSIS — Z0000 Encounter for general adult medical examination without abnormal findings: Secondary | ICD-10-CM | POA: Diagnosis not present

## 2017-10-19 DIAGNOSIS — N183 Chronic kidney disease, stage 3 (moderate): Secondary | ICD-10-CM | POA: Diagnosis not present

## 2017-10-19 DIAGNOSIS — I208 Other forms of angina pectoris: Secondary | ICD-10-CM | POA: Diagnosis not present

## 2017-10-19 DIAGNOSIS — I509 Heart failure, unspecified: Secondary | ICD-10-CM | POA: Diagnosis not present

## 2017-10-19 DIAGNOSIS — I2721 Secondary pulmonary arterial hypertension: Secondary | ICD-10-CM | POA: Diagnosis not present

## 2017-10-19 DIAGNOSIS — I480 Paroxysmal atrial fibrillation: Secondary | ICD-10-CM | POA: Diagnosis not present

## 2017-10-19 DIAGNOSIS — R5381 Other malaise: Secondary | ICD-10-CM | POA: Diagnosis not present

## 2017-10-19 DIAGNOSIS — R7301 Impaired fasting glucose: Secondary | ICD-10-CM | POA: Diagnosis not present

## 2017-10-19 DIAGNOSIS — Z1389 Encounter for screening for other disorder: Secondary | ICD-10-CM | POA: Diagnosis not present

## 2017-10-19 DIAGNOSIS — Z6831 Body mass index (BMI) 31.0-31.9, adult: Secondary | ICD-10-CM | POA: Diagnosis not present

## 2017-10-19 DIAGNOSIS — G4733 Obstructive sleep apnea (adult) (pediatric): Secondary | ICD-10-CM | POA: Diagnosis not present

## 2017-10-19 DIAGNOSIS — Z7901 Long term (current) use of anticoagulants: Secondary | ICD-10-CM | POA: Diagnosis not present

## 2017-10-20 ENCOUNTER — Other Ambulatory Visit: Payer: Self-pay

## 2017-10-20 NOTE — Patient Outreach (Signed)
[facility] [facility]) Care Management  10/20/2017  REAL CONA 09/03/1931 [id number]   EMMI- Herat Failure RED ON EMMI ALERT Day # 21 Date: 10/18/17 Red Alert Reason:  New/worsening problems? Yes  4th Outreach to patient for EMMI red alert.  Male answered stating that patient is not in.  CM contact information given for return call. She states she will give him the message.   Plan: RN CM will wait patient phone call.  Jone Baseman, RN, MSN [facility] Care Management Care Management Coordinator Direct Line [phone] Toll Free: [phone]  Fax: [phone]

## 2017-10-24 ENCOUNTER — Other Ambulatory Visit: Payer: Self-pay

## 2017-10-24 NOTE — Patient Outreach (Addendum)
[facility]) Care Management  10/24/2017  [name] 1931-12-20 [id number]   EMMI- Heart Failure RED ON EMMI ALERT Day # 24 Date:  10/22/17 Red Alert Reason:  Weighed themselves today? No New/worsening problems? Yes New swelling? Yes  EMMI- Heart Failure RED ON EMMI ALERT Day # 25 Date:  10/23/17 Red Alert Reason:  New/worsening problems? Yes New/worsening shortness of breath? Yes  Telephone call to patient for EMMI red alert.  Called home number states voice mail full.   Telephone call to mobile number.  States disconnected.  Letter sent previously to patient.  Plan : RN CM will wait patient return call.   Jone Baseman, RN, MSN [facility] Care Management Care Management Coordinator Direct Line [phone] Toll Free: [phone]  Fax: [phone]

## 2017-10-26 ENCOUNTER — Other Ambulatory Visit: Payer: Self-pay | Admitting: Cardiovascular Disease

## 2017-10-26 DIAGNOSIS — G4733 Obstructive sleep apnea (adult) (pediatric): Secondary | ICD-10-CM

## 2017-10-31 ENCOUNTER — Other Ambulatory Visit: Payer: Self-pay

## 2017-10-31 NOTE — Patient Outreach (Signed)
[facility]) Care Management  10/31/2017  [name] 06-10-1932 [id number]   Multiple attempts to establish contact with patient without success. No response from letter mailed to patient. Case is being closed at this time.   Anthony Baseman, RN, MSN [facility] Care Management Care Management Coordinator Direct Line [phone] Toll Free: [phone]  Fax: [phone]

## 2017-11-03 ENCOUNTER — Encounter (HOSPITAL_COMMUNITY): Payer: Self-pay | Admitting: Internal Medicine

## 2017-11-03 ENCOUNTER — Emergency Department: Payer: Self-pay

## 2017-11-03 ENCOUNTER — Emergency Department (HOSPITAL_COMMUNITY): Payer: Medicare Other

## 2017-11-03 ENCOUNTER — Inpatient Hospital Stay (HOSPITAL_COMMUNITY)
Admission: EM | Admit: 2017-11-03 | Discharge: 2017-11-08 | DRG: 377 | Disposition: A | Discharge status: Home / Self Care | Payer: Medicare Other | Attending: Family Medicine | Admitting: Family Medicine

## 2017-11-03 ENCOUNTER — Inpatient Hospital Stay (HOSPITAL_COMMUNITY): Payer: Medicare Other

## 2017-11-03 ENCOUNTER — Ambulatory Visit: Payer: Self-pay | Admitting: Cardiovascular Disease

## 2017-11-03 DIAGNOSIS — Z885 Allergy status to narcotic agent status: Secondary | ICD-10-CM

## 2017-11-03 DIAGNOSIS — N179 Acute kidney failure, unspecified: Secondary | ICD-10-CM | POA: Diagnosis present

## 2017-11-03 DIAGNOSIS — I2721 Secondary pulmonary arterial hypertension: Secondary | ICD-10-CM | POA: Diagnosis present

## 2017-11-03 DIAGNOSIS — E669 Obesity, unspecified: Secondary | ICD-10-CM | POA: Diagnosis present

## 2017-11-03 DIAGNOSIS — I252 Old myocardial infarction: Secondary | ICD-10-CM

## 2017-11-03 DIAGNOSIS — M19072 Primary osteoarthritis, left ankle and foot: Secondary | ICD-10-CM | POA: Diagnosis present

## 2017-11-03 DIAGNOSIS — Z8249 Family history of ischemic heart disease and other diseases of the circulatory system: Secondary | ICD-10-CM

## 2017-11-03 DIAGNOSIS — Z6829 Body mass index (BMI) 29.0-29.9, adult: Secondary | ICD-10-CM

## 2017-11-03 DIAGNOSIS — I130 Hypertensive heart and chronic kidney disease with heart failure and stage 1 through stage 4 chronic kidney disease, or unspecified chronic kidney disease: Secondary | ICD-10-CM | POA: Diagnosis present

## 2017-11-03 DIAGNOSIS — Z833 Family history of diabetes mellitus: Secondary | ICD-10-CM

## 2017-11-03 DIAGNOSIS — R195 Other fecal abnormalities: Secondary | ICD-10-CM | POA: Diagnosis not present

## 2017-11-03 DIAGNOSIS — I2510 Atherosclerotic heart disease of native coronary artery without angina pectoris: Secondary | ICD-10-CM | POA: Diagnosis present

## 2017-11-03 DIAGNOSIS — I5042 Chronic combined systolic (congestive) and diastolic (congestive) heart failure: Secondary | ICD-10-CM | POA: Diagnosis present

## 2017-11-03 DIAGNOSIS — R571 Hypovolemic shock: Secondary | ICD-10-CM | POA: Diagnosis present

## 2017-11-03 DIAGNOSIS — N178 Other acute kidney failure: Secondary | ICD-10-CM

## 2017-11-03 DIAGNOSIS — R0602 Shortness of breath: Secondary | ICD-10-CM | POA: Diagnosis not present

## 2017-11-03 DIAGNOSIS — M4802 Spinal stenosis, cervical region: Secondary | ICD-10-CM | POA: Diagnosis not present

## 2017-11-03 DIAGNOSIS — N183 Chronic kidney disease, stage 3 unspecified: Secondary | ICD-10-CM | POA: Diagnosis present

## 2017-11-03 DIAGNOSIS — R531 Weakness: Secondary | ICD-10-CM | POA: Diagnosis not present

## 2017-11-03 DIAGNOSIS — Z96653 Presence of artificial knee joint, bilateral: Secondary | ICD-10-CM | POA: Diagnosis present

## 2017-11-03 DIAGNOSIS — D62 Acute posthemorrhagic anemia: Secondary | ICD-10-CM | POA: Diagnosis present

## 2017-11-03 DIAGNOSIS — Z9989 Dependence on other enabling machines and devices: Secondary | ICD-10-CM

## 2017-11-03 DIAGNOSIS — Z22322 Carrier or suspected carrier of Methicillin resistant Staphylococcus aureus: Secondary | ICD-10-CM

## 2017-11-03 DIAGNOSIS — K219 Gastro-esophageal reflux disease without esophagitis: Secondary | ICD-10-CM | POA: Diagnosis present

## 2017-11-03 DIAGNOSIS — I248 Other forms of acute ischemic heart disease: Secondary | ICD-10-CM | POA: Diagnosis present

## 2017-11-03 DIAGNOSIS — I5022 Chronic systolic (congestive) heart failure: Secondary | ICD-10-CM | POA: Diagnosis not present

## 2017-11-03 DIAGNOSIS — R338 Other retention of urine: Secondary | ICD-10-CM | POA: Diagnosis present

## 2017-11-03 DIAGNOSIS — K449 Diaphragmatic hernia without obstruction or gangrene: Secondary | ICD-10-CM | POA: Diagnosis not present

## 2017-11-03 DIAGNOSIS — I2729 Other secondary pulmonary hypertension: Secondary | ICD-10-CM

## 2017-11-03 DIAGNOSIS — R7989 Other specified abnormal findings of blood chemistry: Secondary | ICD-10-CM

## 2017-11-03 DIAGNOSIS — R072 Precordial pain: Secondary | ICD-10-CM | POA: Diagnosis not present

## 2017-11-03 DIAGNOSIS — K3189 Other diseases of stomach and duodenum: Secondary | ICD-10-CM | POA: Diagnosis not present

## 2017-11-03 DIAGNOSIS — Z7901 Long term (current) use of anticoagulants: Secondary | ICD-10-CM

## 2017-11-03 DIAGNOSIS — M19071 Primary osteoarthritis, right ankle and foot: Secondary | ICD-10-CM | POA: Diagnosis present

## 2017-11-03 DIAGNOSIS — D649 Anemia, unspecified: Secondary | ICD-10-CM

## 2017-11-03 DIAGNOSIS — R296 Repeated falls: Secondary | ICD-10-CM | POA: Diagnosis present

## 2017-11-03 DIAGNOSIS — Z452 Encounter for adjustment and management of vascular access device: Secondary | ICD-10-CM | POA: Diagnosis not present

## 2017-11-03 DIAGNOSIS — R51 Headache: Secondary | ICD-10-CM | POA: Diagnosis not present

## 2017-11-03 DIAGNOSIS — K5520 Angiodysplasia of colon without hemorrhage: Secondary | ICD-10-CM | POA: Diagnosis not present

## 2017-11-03 DIAGNOSIS — Z95828 Presence of other vascular implants and grafts: Secondary | ICD-10-CM

## 2017-11-03 DIAGNOSIS — E785 Hyperlipidemia, unspecified: Secondary | ICD-10-CM | POA: Diagnosis present

## 2017-11-03 DIAGNOSIS — K31811 Angiodysplasia of stomach and duodenum with bleeding: Principal | ICD-10-CM | POA: Diagnosis present

## 2017-11-03 DIAGNOSIS — I481 Persistent atrial fibrillation: Secondary | ICD-10-CM | POA: Diagnosis present

## 2017-11-03 DIAGNOSIS — Z809 Family history of malignant neoplasm, unspecified: Secondary | ICD-10-CM

## 2017-11-03 DIAGNOSIS — K254 Chronic or unspecified gastric ulcer with hemorrhage: Secondary | ICD-10-CM

## 2017-11-03 DIAGNOSIS — I509 Heart failure, unspecified: Secondary | ICD-10-CM

## 2017-11-03 DIAGNOSIS — Z950 Presence of cardiac pacemaker: Secondary | ICD-10-CM

## 2017-11-03 DIAGNOSIS — I447 Left bundle-branch block, unspecified: Secondary | ICD-10-CM | POA: Diagnosis present

## 2017-11-03 DIAGNOSIS — G4733 Obstructive sleep apnea (adult) (pediatric): Secondary | ICD-10-CM | POA: Diagnosis present

## 2017-11-03 DIAGNOSIS — Z961 Presence of intraocular lens: Secondary | ICD-10-CM | POA: Diagnosis present

## 2017-11-03 DIAGNOSIS — G2581 Restless legs syndrome: Secondary | ICD-10-CM | POA: Diagnosis present

## 2017-11-03 DIAGNOSIS — Z9842 Cataract extraction status, left eye: Secondary | ICD-10-CM

## 2017-11-03 DIAGNOSIS — Z87891 Personal history of nicotine dependence: Secondary | ICD-10-CM

## 2017-11-03 DIAGNOSIS — F329 Major depressive disorder, single episode, unspecified: Secondary | ICD-10-CM | POA: Diagnosis present

## 2017-11-03 DIAGNOSIS — G8929 Other chronic pain: Secondary | ICD-10-CM | POA: Diagnosis present

## 2017-11-03 DIAGNOSIS — R079 Chest pain, unspecified: Secondary | ICD-10-CM | POA: Diagnosis present

## 2017-11-03 DIAGNOSIS — M109 Gout, unspecified: Secondary | ICD-10-CM | POA: Diagnosis present

## 2017-11-03 DIAGNOSIS — K921 Melena: Secondary | ICD-10-CM | POA: Diagnosis not present

## 2017-11-03 DIAGNOSIS — K922 Gastrointestinal hemorrhage, unspecified: Secondary | ICD-10-CM | POA: Diagnosis present

## 2017-11-03 DIAGNOSIS — Z8711 Personal history of peptic ulcer disease: Secondary | ICD-10-CM

## 2017-11-03 DIAGNOSIS — R778 Other specified abnormalities of plasma proteins: Secondary | ICD-10-CM

## 2017-11-03 DIAGNOSIS — R000 Tachycardia, unspecified: Secondary | ICD-10-CM | POA: Diagnosis not present

## 2017-11-03 DIAGNOSIS — I428 Other cardiomyopathies: Secondary | ICD-10-CM | POA: Diagnosis present

## 2017-11-03 DIAGNOSIS — N401 Enlarged prostate with lower urinary tract symptoms: Secondary | ICD-10-CM | POA: Diagnosis present

## 2017-11-03 DIAGNOSIS — D61818 Other pancytopenia: Secondary | ICD-10-CM | POA: Diagnosis present

## 2017-11-03 DIAGNOSIS — Z1212 Encounter for screening for malignant neoplasm of rectum: Secondary | ICD-10-CM | POA: Diagnosis not present

## 2017-11-03 DIAGNOSIS — J42 Unspecified chronic bronchitis: Secondary | ICD-10-CM | POA: Diagnosis present

## 2017-11-03 DIAGNOSIS — I482 Chronic atrial fibrillation, unspecified: Secondary | ICD-10-CM | POA: Diagnosis present

## 2017-11-03 DIAGNOSIS — Z955 Presence of coronary angioplasty implant and graft: Secondary | ICD-10-CM

## 2017-11-03 DIAGNOSIS — G250 Essential tremor: Secondary | ICD-10-CM | POA: Diagnosis present

## 2017-11-03 DIAGNOSIS — I4819 Other persistent atrial fibrillation: Secondary | ICD-10-CM | POA: Diagnosis present

## 2017-11-03 DIAGNOSIS — R578 Other shock: Secondary | ICD-10-CM | POA: Diagnosis present

## 2017-11-03 DIAGNOSIS — Z9841 Cataract extraction status, right eye: Secondary | ICD-10-CM

## 2017-11-03 LAB — PREPARE RBC (CROSSMATCH)

## 2017-11-03 LAB — BASIC METABOLIC PANEL
Anion gap: 13 (ref 5–15)
BUN: 149 mg/dL — AB (ref 6–20)
CHLORIDE: 94 mmol/L — AB (ref 101–111)
CO2: 27 mmol/L (ref 22–32)
Calcium: 9 mg/dL (ref 8.9–10.3)
Creatinine, Ser: 3.68 mg/dL — ABNORMAL HIGH (ref 0.61–1.24)
GFR calc Af Amer: 16 mL/min — ABNORMAL LOW (ref >60)
GFR calc non Af Amer: 14 mL/min — ABNORMAL LOW (ref >60)
GLUCOSE: 108 mg/dL — AB (ref 65–99)
POTASSIUM: 4.7 mmol/L (ref 3.5–5.1)
Sodium: 134 mmol/L — ABNORMAL LOW (ref 135–145)

## 2017-11-03 LAB — BRAIN NATRIURETIC PEPTIDE: B NATRIURETIC PEPTIDE 5: 1728.6 pg/mL — AB (ref 0.0–100.0)

## 2017-11-03 LAB — CBC
HEMATOCRIT: 20.8 % — AB (ref 39.0–52.0)
Hemoglobin: 7 g/dL — ABNORMAL LOW (ref 13.0–17.0)
MCH: 37 pg — AB (ref 26.0–34.0)
MCHC: 33.7 g/dL (ref 30.0–36.0)
MCV: 110.1 fL — AB (ref 78.0–100.0)
Platelets: 109 10*3/uL — ABNORMAL LOW (ref 150–400)
RBC: 1.89 MIL/uL — ABNORMAL LOW (ref 4.22–5.81)
RDW: 18 % — AB (ref 11.5–15.5)
WBC: 2.7 10*3/uL — ABNORMAL LOW (ref 4.0–10.5)

## 2017-11-03 LAB — I-STAT TROPONIN, ED: Troponin i, poc: 0.47 ng/mL (ref 0.00–0.08)

## 2017-11-03 LAB — POC OCCULT BLOOD, ED: Fecal Occult Bld: POSITIVE — AB

## 2017-11-03 LAB — HEMOGLOBIN A1C
Hgb A1c MFr Bld: 4.5 % — ABNORMAL LOW (ref 4.8–5.6)
Mean Plasma Glucose: 82.45 mg/dL

## 2017-11-03 LAB — MAGNESIUM: MAGNESIUM: 2.2 mg/dL (ref 1.7–2.4)

## 2017-11-03 LAB — TSH: TSH: 3.099 u[IU]/mL (ref 0.350–4.500)

## 2017-11-03 MED ORDER — ESCITALOPRAM OXALATE 10 MG PO TABS
5.0000 mg | ORAL_TABLET | Freq: Every day | ORAL | Status: DC
  Administered 2017-11-04 – 2017-11-07 (×4): 5 mg via ORAL
  Filled 2017-11-03 (×4): qty 1

## 2017-11-03 MED ORDER — DUTASTERIDE 0.5 MG PO CAPS
0.5000 mg | ORAL_CAPSULE | Freq: Every day | ORAL | Status: DC
  Administered 2017-11-05 – 2017-11-08 (×4): 0.5 mg via ORAL
  Filled 2017-11-03 (×5): qty 1

## 2017-11-03 MED ORDER — SODIUM CHLORIDE 0.9 % IV SOLN
Freq: Once | INTRAVENOUS | Status: AC
  Administered 2017-11-03: 19:00:00 via INTRAVENOUS

## 2017-11-03 MED ORDER — LACTATED RINGERS IV SOLN
INTRAVENOUS | Status: DC
  Administered 2017-11-03 – 2017-11-08 (×7): via INTRAVENOUS

## 2017-11-03 MED ORDER — SODIUM CHLORIDE 0.9 % IV SOLN
80.0000 mg | Freq: Once | INTRAVENOUS | Status: AC
  Administered 2017-11-03: 22:00:00 80 mg via INTRAVENOUS
  Filled 2017-11-03: qty 80

## 2017-11-03 MED ORDER — MIRTAZAPINE 15 MG PO TABS
15.0000 mg | ORAL_TABLET | Freq: Every day | ORAL | Status: DC
  Administered 2017-11-04 – 2017-11-07 (×4): 15 mg via ORAL
  Filled 2017-11-03 (×5): qty 1

## 2017-11-03 MED ORDER — MORPHINE SULFATE (PF) 4 MG/ML IV SOLN: 2.0000 mg | INTRAVENOUS | Status: DC | PRN

## 2017-11-03 MED ORDER — ALLOPURINOL 100 MG PO TABS
200.0000 mg | ORAL_TABLET | Freq: Every day | ORAL | Status: DC
  Administered 2017-11-05 – 2017-11-08 (×4): 200 mg via ORAL
  Filled 2017-11-03 (×5): qty 2

## 2017-11-03 MED ORDER — SODIUM CHLORIDE 0.9% FLUSH
10.0000 mL | INTRAVENOUS | Status: DC | PRN
  Administered 2017-11-08: 20 mL
  Filled 2017-11-03: qty 40

## 2017-11-03 MED ORDER — ACETAMINOPHEN 325 MG PO TABS
650.0000 mg | ORAL_TABLET | ORAL | Status: DC | PRN
  Administered 2017-11-04 – 2017-11-07 (×3): 650 mg via ORAL
  Filled 2017-11-03 (×3): qty 2

## 2017-11-03 MED ORDER — ROPINIROLE HCL 1 MG PO TABS
3.0000 mg | ORAL_TABLET | Freq: Every day | ORAL | Status: DC
  Administered 2017-11-04 – 2017-11-07 (×4): 3 mg via ORAL
  Filled 2017-11-03 (×5): qty 3

## 2017-11-03 MED ORDER — SODIUM CHLORIDE 0.9 % IV SOLN
8.0000 mg/h | INTRAVENOUS | Status: DC
  Administered 2017-11-03 – 2017-11-06 (×5): 8 mg/h via INTRAVENOUS
  Filled 2017-11-03 (×12): qty 80

## 2017-11-03 MED ORDER — SODIUM CHLORIDE 0.9% FLUSH
10.0000 mL | Freq: Two times a day (BID) | INTRAVENOUS | Status: DC
  Administered 2017-11-04: 20 mL
  Administered 2017-11-08: 10 mL

## 2017-11-03 MED ORDER — LORAZEPAM 2 MG/ML IJ SOLN
1.0000 mg | Freq: Once | INTRAMUSCULAR | Status: AC
  Administered 2017-11-03: 1 mg via INTRAMUSCULAR
  Filled 2017-11-03: qty 1

## 2017-11-03 MED ORDER — ONDANSETRON HCL 4 MG/2ML IJ SOLN: 4.0000 mg | Freq: Four times a day (QID) | INTRAMUSCULAR | Status: DC | PRN

## 2017-11-03 NOTE — ED Notes (Signed)
Pt is awake and alert.  Placed on bedpan.  CPAP stopped and removed from pt, [place] placed at 2L

## 2017-11-03 NOTE — ED Notes (Signed)
Blood bank states they do not see an order to transfuse another unit of PRBC.  They states that they are not able to see the "transfus" order.  They need an order to 'prepare RBC.'  Order placed and they will call when it is ready.

## 2017-11-03 NOTE — Consult Note (Addendum)
Cardiology Consultation:   Patient ID: SAHIB PELLA; [id number]; May 11, 1932   Admit date: 11/03/2017 Date of Consult: 11/03/2017  Primary Care Provider: [name], MD Primary Cardiologist: Dr. [name] /Dr. Radford Pax  Patient Profile:   [name] is a 84 y.o. male with a hx of long-standing permanent atrial fibrillation with slow ventricular response on Eliquis (however ran out last Tuesday, last dose 11/01/2017), single chamber pacemaker, obstructive sleep apnea on CPAP followed by Dr. Claiborne Billings, systemic hypertension, nonischemic cardiomyopathy with mildly depressed left ventricular systolic function, obesity, pulmonary hypertension, gout and BPH who is being seen today for the evaluation of chest pain and dyspnea on exertion at the request of Dr. Lorin Mercy.  History of Present Illness:   Mr. Cancio is an 84 year old male with a history stated above who presented to [facility] emergency department on 11/03/2017 with complaints of chest pain and dyspnea on exertion. Most HPI given per wife and chart review secondary to pt receiving Ativan for PICC line placement. It is reported that he was having chest pain which began yesterday afternoon while at the beach. He took SL NTG x 2  last night with relief of his symptoms. It is reported that he was having  severe nausea all yesterday evening and into the last night. He was up all last night with dry heaves, feeling like he needed to vomit. He was having difficulty walking and was feeling extremely fatigued with any exertion. He has reported feeling extremely short of breath which began yesterday as well.  He has a history of diastolic (most recently systolic) CHF and wife reports being compliant on a low-sodium diet. He had one one dark stool this morning which, along with his other symptoms, prompted him to proceed to the ED for further evaluation. Currently, he is fairly sedated secondary to Ativan administration for PICC line placement. Wife  states that he had no reports of dizziness, diaphoresis, numbness and/or tingling in his arms or hands, palpitations or syncopal episodes. She states that he has been taking an occasional NSAID and BC powder x1 on Saturday secondary to running out of his tramadol for lower leg pain.   In the ED, he has essentially been workup up for acute GI bleed. His Hb today is 7.0, a drop from 8.7 on 10/02/17. He  appears to be symptomatic with his acute anemia.  An iTrop level was obtained at 0.47.  His BNP level was elevated at 1728. GI was consulted for occult GI bleeding. He has had an acute elevation in his creatinine from last month  1.47>>3.69 today. He was given 1 unit PRBC. A PICC line was placed for IV access. His TSH within normal limits 3.099. An EKG was performed which revealed a wide QRS complex with left bundle branch block and no acute ST- T wave changes, unchanged from previous tracings. A Chest x-ray was completed which showed mild congestive heart failure.  Of note, he was most recently seen by Dr. [name] for PPM follow up and seemed to be doing well. His echocardiogram from 09/21/2017 showed anLVEF reduction to 25-30% however, Dr. [name] personally commented on the report does not agree with study review however, feels that this is rather 40-45%>> close to his previous baseline of 45-50%.  He reports that there is profound dyssynchrony of contraction due to RV pacing with his medical concern being pulmonary hypertension and severe tricuspid insufficiency.  His last device interrogation with underlying rhythm as atrial fibrillation with slow ventricular response  in the 45-50 range.  Pacemaker interrogation shows a ventricular pacing occur 68% of the time. He has rare brief episodes of high ventricular rate, without electrograms available for review.  Lead parameters excellent. Battery longevity is estimated at about another 5 years Corporate investment banker implanted 2015).  Last coronary workup 2010 in  which he had normal coronary arteries with LVEF of 40-45%, 2012 echocardiogram showed LVEF of 50-55%.  In 2015 EF was 55-60% by echocardiogram.  In March 2018, L LVEF 50-55%, moderate to severe tricuspid regurgitation and estimated systolic PA pressure 43 mmHg.  Past Medical History:  Diagnosis Date  . Arthritis    "feet" (09/30/2017)  . CHF (congestive heart failure) ([place])   . Chronic bronchitis ([place])   . CKD (chronic kidney disease), stage III ([place])    [name] 09/30/2017  . Coronary artery disease   . Diverticulitis   . GERD (gastroesophageal reflux disease)   . Gout   . History of blood transfusion    "blood loss" (09/30/2017)  . Hyperlipidemia   . Hypertension   . MI (myocardial infarction) ([place]) ~ 2000   "light one"  . Nonischemic cardiomyopathy (H[facility])    mild  . OSA on CPAP   . Permanent atrial fibrillation (H[facility])    on Eliquis  . Presence of permanent cardiac pacemaker   . Pulmonary hypertension ([place])   . Restless legs     Past Surgical History:  Procedure Laterality Date  . APPENDECTOMY  12/2000   [name] 12/22/2010  . CARDIAC CATHETERIZATION  11/07/2008   nonischemic cardiomyopathy,pulmonary hypertension  . CATARACT EXTRACTION W/ INTRAOCULAR LENS  IMPLANT, BILATERAL Bilateral   . CIRCUMCISION  12/2005   [name] 12/22/2010  . COLOSTOMY  12/2000   [name] 12/22/2010  . COLOSTOMY REVERSAL  07/2001   [name] 12/22/2010  . CORONARY ANGIOPLASTY  06/01/1999   successful to ostium of the first diagonal  . ESOPHAGOGASTRODUODENOSCOPY N/A 08/22/2013   Procedure: ESOPHAGOGASTRODUODENOSCOPY (EGD);  Surgeon: [name], MD;  Location: [facility] ENDOSCOPY;  Service: Endoscopy;  Laterality: N/A;  h/p in file cabinet, jackie  . ESOPHAGOGASTRODUODENOSCOPY N/A 11/05/2014   Procedure: ESOPHAGOGASTRODUODENOSCOPY (EGD);  Surgeon: Clarene Essex, MD;  Location: [facility] ENDOSCOPY;  Service: Endoscopy;  Laterality: N/A;  . ESOPHAGOGASTRODUODENOSCOPY N/A 11/08/2016   Procedure: ESOPHAGOGASTRODUODENOSCOPY (EGD);   Surgeon: [name], MD;  Location: [facility] ENDOSCOPY;  Service: Endoscopy;  Laterality: N/A;  . HOT HEMOSTASIS N/A 11/05/2014   Procedure: HOT HEMOSTASIS (ARGON PLASMA COAGULATION/BICAP);  Surgeon: Clarene Essex, MD;  Location: [facility] ENDOSCOPY;  Service: Endoscopy;  Laterality: N/A;  . INSERT / REPLACE / REMOVE PACEMAKER    . JOINT REPLACEMENT    . MASS EXCISION Left    hand w/ulnar artery reconstruction/notes 12/22/2010  . NM MYOCAR PERF WALL MOTION  11/24/2007   normal  . PERMANENT PACEMAKER INSERTION  10/04/2012   [employer]  . PERMANENT PACEMAKER INSERTION N/A 10/12/2011   Procedure: PERMANENT PACEMAKER INSERTION;  Surgeon: [name], MD;  Location: [facility] CATH LAB;  Service: Cardiovascular;  Laterality: N/A;  . REPLACEMENT TOTAL KNEE Bilateral 11/2006   right-left/notes 12/22/2010  . ROTATOR CUFF REPAIR Right 09/2004   [name] 12/22/2010  . SAVORY DILATION N/A 08/22/2013   Procedure: SAVORY DILATION;  Surgeon: [name], MD;  Location: [facility] ENDOSCOPY;  Service: Endoscopy;  Laterality: N/A;  . SHOULDER SURGERY Right    "fell off house; messed up 3 things in my arm"  . TONSILLECTOMY    . US ECHOCARDIOGRAPHY  02/01/2011   LA is mod-severely  dilated,AOV & root sclerotic,ca+ AOV leaflets     Prior to Admission medications   Medication Sig Start Date End Date Taking? Authorizing Provider  acetaminophen (TYLENOL) 650 MG CR tablet Take 650 mg by mouth every 8 (eight) hours as needed for pain.   Yes [provider]  allopurinol (ZYLOPRIM) 100 MG tablet Take 2 tablets (200 mg total) by mouth daily. 11/09/16  Yes Theodis Blaze, MD  Amino Acids-Protein Hydrolys (FEEDING SUPPLEMENT, PRO-STAT SUGAR FREE 64,) LIQD Take 30 mLs by mouth 2 (two) times daily. 10/02/17  Yes Jani Gravel, MD  apixaban (ELIQUIS) 5 MG TABS tablet Take 5 mg by mouth 2 (two) times daily.   Yes [provider]  colchicine 0.6 MG tablet Take 0.6 mg by mouth daily as needed (gout flare). Colcrys   Yes [provider]  Docusate Calcium (STOOL SOFTENER PO) Take 2 tablets by mouth at bedtime.    Yes [provider]  dutasteride (AVODART) 0.5 MG capsule Take 0.5 mg by mouth daily.   Yes [provider]  escitalopram (LEXAPRO) 5 MG tablet Take 1 tablet (5 mg total) by mouth at bedtime. 11/09/16  Yes Theodis Blaze, MD  ferrous sulfate 325 (65 FE) MG tablet Take 325 mg by mouth daily with breakfast.   Yes [provider]  furosemide (LASIX) 40 MG tablet Take 80 mg by mouth 2 (two) times daily.   Yes [provider]  hydrOXYzine (ATARAX/VISTARIL) 25 MG tablet TAKE 1 TABLET BY MOUTH EVERY 6 HOURS AS NEEDED FOR ITCHING Patient taking differently: TAKE 1 TABLET (25 MG) BY MOUTH EVERY 6 HOURS AS NEEDED FOR ITCHING 07/07/17  Yes Croitoru, Mihai, MD  loratadine (CLARITIN) 10 MG tablet Take 10 mg by mouth daily.   Yes [provider]  mirtazapine (REMERON) 15 MG tablet Take 15 mg by mouth at bedtime.   Yes [provider]  Multiple Vitamin (MULTIVITAMIN WITH MINERALS) TABS tablet Take 1 tablet by mouth daily.   Yes [provider]  Multiple Vitamins-Minerals (PRESERVISION AREDS 2) CAPS Take 1 capsule by mouth daily.   Yes [provider]  pantoprazole (PROTONIX) 40 MG tablet Take 1 tablet (40 mg total) by mouth daily. 11/10/16  Yes Theodis Blaze, MD  potassium chloride (K-DUR) 10 MEQ tablet Take 1 tablet (10 mEq total) by mouth daily. Patient taking differently: Take 10 mEq by mouth 2 (two) times daily.  03/17/17  Yes Croitoru, Mihai, MD  rOPINIRole (REQUIP) 3 MG tablet Take 3 mg by mouth at bedtime.  08/02/15  Yes [provider]  Tamsulosin HCl (FLOMAX) 0.4 MG CAPS Take 0.4 mg by mouth daily after breakfast.   Yes [provider]  traMADol (ULTRAM) 50 MG tablet Take 100 mg by mouth 2 (two) times daily. For pain    Yes [provider]  furosemide (LASIX) 40 MG tablet Take 2 tablets (80 mg total) by mouth See admin  instructions. Take 2 tablets (80 mg) by mouth twice daily, Take extra tablet (40 mg) midday x 4 days only (starting 09/27/17) 10/02/17 11/01/17  Jani Gravel, MD  nitroGLYCERIN (NITROSTAT) 0.4 MG SL tablet Place 0.4 mg under the tongue every 5 (five) minutes as needed for chest pain.     [provider]  spironolactone (ALDACTONE) 25 MG tablet Take 1 tablet (25 mg total) by mouth daily. Patient not taking: Reported on 11/03/2017 10/03/17   Jani Gravel, MD    Inpatient Medications: Scheduled Meds: . LORazepam  1 mg  Intramuscular Once   Continuous Infusions: . sodium chloride     PRN Meds:  Allergies:    Allergies  Allergen Reactions  . Vicodin [Hydrocodone-Acetaminophen] Itching  . Hydrocodone Itching    Social History:   Social History   Socioeconomic History  . Marital status: Married    Spouse name: Not on file  . Number of children: Not on file  . Years of education: Not on file  . Highest education level: Not on file  Occupational History  . Occupation: retired  Scientific laboratory technician  . Financial resource strain: Not on file  . Food insecurity:    Worry: Not on file    Inability: Not on file  . Transportation needs:    Medical: Not on file    Non-medical: Not on file  Tobacco Use  . Smoking status: Former Research scientist (life sciences)  . Smokeless tobacco: Never Used  . Tobacco comment: 09/30/2017 "it's been over 18yr since I smoked anything"  Substance and Sexual Activity  . Alcohol use: No  . Drug use: No  . Sexual activity: Never  Lifestyle  . Physical activity:    Days per week: Not on file    Minutes per session: Not on file  . Stress: Not on file  Relationships  . Social connections:    Talks on phone: Not on file    Gets together: Not on file    Attends religious service: Not on file    Active member of club or organization: Not on file    Attends meetings of clubs or organizations: Not on file    Relationship status: Not on file  . Intimate partner violence:    Fear of  current or ex partner: Not on file    Emotionally abused: Not on file    Physically abused: Not on file    Forced sexual activity: Not on file  Other Topics Concern  . Not on file  Social History Narrative  . Not on file    Family History:   Family History  Problem Relation Age of Onset  . Cancer Mother   . Heart attack Father   . Diabetes Brother    Family Status:  Family Status  Relation Name Status  . Mother  Deceased  . Father  Deceased  . Brother  Alive  . Brother  Alive  . Brother  (Not Specified)    ROS:  Please see the history of present illness.  All other ROS reviewed and negative.     Physical Exam/Data:   Vitals:   11/03/17 1315 11/03/17 1330 11/03/17 1345 11/03/17 1415  BP: (!) 91/49 (!) 94/51 101/60 (!) 99/54  Pulse: (!) 59 60 (!) 59 (!) 59  Resp: 19 16 16 15   Temp:      TempSrc:      SpO2: 99% 95% 92% 100%   No intake or output data in the 24 hours ending 11/03/17 1547 There were no vitals filed for this visit. There is no height or weight on file to calculate BMI.   General: Elderly, frail, somnolent,  NAD Skin: Warm, dry, intact  Head: Normocephalic, atraumatic, clear, moist mucus membranes. Neck: Negative for carotid bruits. No JVD Lungs:Clear to ausculation bilaterally. No wheezes, rales, or rhonchi. Breathing is unlabored. Cardiovascular: RRR paced with S1 S2. No murmurs, rubs or gallops. Abdomen: Tender, non-distended with normoactive bowel sounds. No guarding. No obvious abdominal masses. MSK: Strength and tone appear normal for age. 5/5 in all extremities Extremities: No edema. No  clubbing or cyanosis. DP/PT pulses 1+ bilaterally Neuro: Sedated. No focal deficits. No facial asymmetry. MAE spontaneously. Psych: Responds to some questions appropriately however is somnolent secondary to sedation   EKG:  The EKG was personally reviewed and demonstrates: 11/03/17 NSR/SB with wide QRS complex heart rate 60 LBBB Telemetry:  Telemetry was  personally reviewed and demonstrates: Paced HR 60  Relevant CV Studies:  ECHO: 09/21/2017 Study Conclusions  - Left ventricle: The cavity size was moderately dilated. There was   mild concentric hypertrophy. Systolic function was severely   reduced. The estimated ejection fraction was in the range of 25%   to 30%. The study is not technically sufficient to allow   evaluation of LV diastolic function. - Mitral valve: There was mild regurgitation. - Left atrium: The atrium was moderately dilated. - Right ventricle: The cavity size was moderately dilated. - Right atrium: The atrium was moderately dilated. - Atrial septum: No defect or patent foramen ovale was identified. - Tricuspid valve: There was severe regurgitation. - Pulmonary arteries: PA peak pressure: 111 mm Hg (S  ( Per MD I disagree with the report. I do not think the EF is 25-30%, but rather 40-45%, close to his previous baseline. There is however profound dyssynchrony of contraction due to RV pacing (which is occurring with increasing frequency due to slow heart rates (although none of his meds slow the heart rate). The biggest problem remains severe pulmonary HTN and severe tricuspid insufficiency)   Echocardiogram 11/05/2016: Study Conclusions  - Left ventricle: The cavity size was moderately dilated. There was   moderate concentric hypertrophy. Systolic function was normal.   The estimated ejection fraction was in the range of 50% to 55%.   Wall motion was normal; there were no regional wall motion   abnormalities. Doppler parameters are consistent with restrictive   physiology, indicative of decreased left ventricular diastolic   compliance and/or increased left atrial pressure. Doppler   parameters are consistent with elevated ventricular end-diastolic   filling pressure. - Ventricular septum: Septal motion showed paradox. - Aortic valve: Trileaflet; moderately thickened, moderately   calcified leaflets. Valve  mobility was restricted. Transvalvular   velocity was within the normal range. There was no stenosis.   There was mild regurgitation. - Mitral valve: Calcified annulus. Mildly thickened leaflets .   There was mild regurgitation. - Left atrium: The atrium was severely dilated. - Right ventricle: The cavity size was moderately dilated. Wall   thickness was normal. Systolic function was mildly reduced. - Right atrium: The atrium was severely dilated. Pacer wire or   catheter noted in right atrium. - Tricuspid valve: There was moderate-severe regurgitation. - Pulmonic valve: There was mild regurgitation. - Pulmonary arteries: Systolic pressure was moderately increased.   PA peak pressure: 43 mm Hg (S). - Inferior vena cava: The vessel was dilated. The respirophasic   diameter changes were blunted (< 50%), consistent with elevated   central venous pressure.  Impressions:  - No significant difference since the prior study on 09/12/2014.  CATH: 11/07/08 CORONARY ARTERIOGRAPHY:  1. Left main was normal and bifurcated.  2. Circumflex:  The circumflex was basically a hybrid system.  It gave      rise to a very large ongoing circumflex.  It gave rise to a      marginal vessel at the terminal portion of this.  There were 2      medium-sized OMs that came off very proximal.  These vessels were  free of disease.  3. LAD:  The LAD extended down to the apex of the heart.  It was free      of disease.  The first diagonal was free of disease.  4. Right coronary artery:  The right coronary was a large dominant      vessel.  There was no high-grade stenosis.  There was no narrowing      in the right coronary artery.  The PDA was large, free of disease      as was posterolateral vessel.  Laboratory Data:  Chemistry Recent Labs  Lab 11/03/17 1153  NA 134*  K 4.7  CL 94*  CO2 27  GLUCOSE 108*  BUN 149*  CREATININE 3.68*  CALCIUM 9.0  GFRNONAA 14*  GFRAA 16*  ANIONGAP 13    Total  Protein  Date Value Ref Range Status  10/02/2017 6.3 (L) 6.5 - 8.1 g/dL Final   Albumin  Date Value Ref Range Status  10/02/2017 2.9 (L) 3.5 - 5.0 g/dL Final   AST  Date Value Ref Range Status  10/02/2017 41 15 - 41 U/L Final   ALT  Date Value Ref Range Status  10/02/2017 28 17 - 63 U/L Final   Alkaline Phosphatase  Date Value Ref Range Status  10/02/2017 76 38 - 126 U/L Final   Total Bilirubin  Date Value Ref Range Status  10/02/2017 0.7 0.3 - 1.2 mg/dL Final   Hematology Recent Labs  Lab 11/03/17 1153  WBC 2.7*  RBC 1.89*  HGB 7.0*  HCT 20.8*  MCV 110.1*  MCH 37.0*  MCHC 33.7  RDW 18.0*  PLT 109*   Cardiac EnzymesNo results for input(s): TROPONINI in the last 168 hours.  Recent Labs  Lab 11/03/17 1158  TROPIPOC 0.47*    BNP Recent Labs  Lab 11/03/17 1344  BNP 1,728.6*    DDimer No results for input(s): DDIMER in the last 168 hours. TSH:  Lab Results  Component Value Date   TSH 3.099 11/03/2017   Lipids: Lab Results  Component Value Date   CHOL  11/04/2008    173        ATP III CLASSIFICATION:  <200     mg/dL   Desirable  200-239  mg/dL   Borderline High  >=240    mg/dL   High          HDL 37 (L) 11/04/2008   LDLCALC (H) 11/04/2008    111        Total Cholesterol/HDL:CHD Risk Coronary Heart Disease Risk Table                     Men   Women  1/2 Average Risk   3.4   3.3  Average Risk       5.0   4.4  2 X Average Risk   9.6   7.1  3 X Average Risk  23.4   11.0        Use the calculated Patient Ratio above and the CHD Risk Table to determine the patient's CHD Risk.        ATP III CLASSIFICATION (LDL):  <100     mg/dL   Optimal  100-129  mg/dL   Near or Above                    Optimal  130-159  mg/dL   Borderline  160-189  mg/dL   High  >190     mg/dL  Very High   TRIG 126 11/04/2008   CHOLHDL 4.7 11/04/2008   HgbA1c:No results found for: HGBA1C  Radiology/Studies:  Dg Chest 2 View  Result Date: 11/03/2017 CLINICAL DATA:   84 year old male with history of substernal chest pain and shortness of breath yesterday and today. EXAM: CHEST - 2 VIEW COMPARISON:  Chest x-ray 09/30/2017. FINDINGS: There is cephalization of the pulmonary vasculature and slight indistinctness of the interstitial markings suggestive of mild pulmonary edema. No pleural effusions. Mild cardiomegaly. Upper mediastinal contours are within normal limits. Aortic atherosclerosis. Left sided pacemaker with single lead projecting over the expected location of the right ventricular apex. IMPRESSION: 1. The appearance the chest is most compatible with mild congestive heart failure, as above. 2. Aortic atherosclerosis. Electronically Signed   By: Vinnie Langton M.D.   On: 11/03/2017 12:21   [place] Ekg Site Rite  Result Date: 11/03/2017 If Site Rite image not attached, placement could not be confirmed due to current cardiac rhythm.   Assessment and Plan:   1.  Chest pain: -Currently chest pain free >>reproducible abdominal, epigastric pain  -Chest pain likely secondary to demand ischemia given acute GI bleed. -Troponin level elevated (iTrop 0.47), will need to continue to trend -EKG with no acute ST-T wave changes, no changes from previous tracings -Mildly hypotensive, 99/54, 101/60, 94/51 -No home ASA, no BB>>non-obstructive CAD per cath 2010  2.  Permanant atrial fibrillation status post PPM placement: -PPM in place with heart rate controlled, 59-60 -Eliquis on hold secondary to GI bleed -On no blocking agents -GI consulted>>to instruct when safe to resume anticoagulation   3. Diastolic CHF: -BNP markedly elevated on admission, 1,728 -Per last echocardiogram 09/2017 with decreased systolic function however, per MD note, it appears that he does not agree with this LVEF evaluation. Reports LVEF or like 40-45%, consistent with his previous echocardiograms. -Home Lasix 80 mg PO BID with potassium supplementation  on hold for now  -Aldactone 25 mg PO QD  on hold -Pt not markedly fluid volume overloaded on exam   4.  S/P PPM placement: -Stable, last office follow up without complication   5.  OSA: -Continue CPAP use  6.  GI bleed: -Per primary team -GI bleed secondary to NSAID/BC powder use, along with Eliquis therapy -Eliquis on hold secondary to GI bleed, last dose appears to have been last Tuesday, 11/01/2017 -Troponin likely secondary to demand ischemia -GI consulted, possible EGD -Receiving 1 PRBC   For questions or updates, please contact [facility] Please consult [email or web address] for contact info under Cardiology/STEMI.   Lyndel Safe NP-C [facility] Pager: [phone] 11/03/2017 3:47 PM  Personally seen and examined. Agree with above.  84 year old male patient of Dr. Lorenza Cambridge with permanent atrial fibrillation, nonischemic cardiomyopathy prior cardiac catheterization in 2010 showing normal coronary arteries, single-chamber pacemaker, obstructive sleep apnea and secondary pulmonary hypertension who has had a long-standing history of anemia from GI source who presented to the emergency department with hemoglobin of 7, creatinine approximating 4, mildly hypotensive consistent with acute on chronic GI bleed.  He has been taking increased BC powders.  He ran out of Eliquis on 11/01/17.  He was given an Ativan in the emergency department and was quite somnolent when I came to see him.  History was obtained from his daughter and wife.  Agree with above.  Exam: Quite somnolent but arousable.  He is especially sensitive when I pushed him in the epigastric area.,  Lungs appear to be fairly clear, no excessive JVD, elderly, ecchymosis  above right eye.  Labs: Hemoglobin 7.0, creatinine 3.98.  Assessment and plan:  Acute on chronic GI bleed -Dr. Watt Climes with GI has been consulted by primary team. -Transfuse red blood cells.  Resuscitation.  Agree with fluid as well despite his diagnosis of chronic systolic heart failure.  Hold  all antihypertensive medications.  Hold all diuretics.  Elevated troponin -Demand ischemia secondary to severe anemia with his underlying nonischemic cardiomyopathy.  This is not appear to be ACS.  His chest discomfort was likely epigastric discomfort which I elicited when palpating this region.  Continue to trend troponin.  Troponin elevation in the setting does portend a worsened prognosis overall.  No invasive cardiovascular workup.  It goes without saying, do not treat him as acute coronary syndrome with heparin or aspirin given his GI bleed.  Chronic systolic heart failure -Ejection fraction in the 45% range.  He is likely intravascularly volume depleted based upon his acute kidney injury and hemoglobin, hard to interpret what his elevated BNP means..  His blood pressure at last check was 85 systolic but previously 983-382 systolic.  Perhaps the Ativan is contributing.  Nonetheless, I would provide resuscitative efforts.  Obviously, pulmonary edema begins to develop, he may require pressor agents such as norepinephrine with concomitant Lasix.  Carefully monitor.  Permanent atrial fibrillation -Pacemaker for backup.  Of course, he has been off of his Eliquis since Tuesday.  This is actually advantageous.  Continue to hold anticoagulation until further greenlight from gastroenterology.  Critical care time 67 minutes-spent with patient, interview with patient's wife and daughter, extensive data review in this gentleman with nonischemic cardiomyopathy, chronic systolic heart failure, acute kidney injury/acute kidney failure with severe anemia, hypotension, possible hypovolemic shock from blood loss.  Candee Furbish, MD

## 2017-11-03 NOTE — ED Notes (Signed)
IV team at bedside 

## 2017-11-03 NOTE — ED Triage Notes (Signed)
Pt arrives by [facility] after family called out for generalized weakness that has became worse over the last weeks. Pt also reports a "choking" feeling in his chest. Pt has bruises on his face and and arms at various stages, pt states he has been falling at home due to weakness.

## 2017-11-03 NOTE — ED Provider Notes (Signed)
[name] EMERGENCY DEPARTMENT Provider Note   CSN: [id number] Arrival date & time: 11/03/17  1132     History   Chief Complaint Chief Complaint  Patient presents with  . Weakness    HPI [name] [name] is a 84 y.o. male.  The history is provided by the patient, a relative [name] medical records.  Chest Pain   This is a new problem. The current episode started 2 days ago. The problem occurs constantly. The problem has not changed since onset.The pain is associated with exertion. The pain is present in the substernal region. The pain is moderate. The quality of the pain is described as exertional, pressure-like, heavy [name] dull. The pain radiates to the left neck [name] right neck. The symptoms are aggravated by exertion. Associated symptoms include exertional chest pressure, lower extremity edema, malaise/fatigue, nausea, near-syncope, shortness of breath [name] vomiting. Pertinent negatives include no abdominal pain, no back pain, no cough, no diaphoresis, no fever, no headaches, no numbness, no palpitations, no sputum production, no syncope [name] no weakness. He has tried nothing for the symptoms. The treatment provided no relief. Risk factors include male gender.  His past medical history is significant for CAD, [name], hypertension, MI [name] [name].    Past Medical History:  Diagnosis Date  . Arthritis    "feet" (09/30/2017)  . [name] (congestive heart failure) ([place])   . Chronic bronchitis ([place])   . [name] [name] (chronic kidney disease), stage III ([place])    [name] [name] 09/30/2017  . Coronary artery disease   . Diverticulitis   . GERD (gastroesophageal reflux disease)   . Gout   . History of blood transfusion    "blood loss" (09/30/2017)  . Hyperlipidemia   . Hypertension   . MI (myocardial infarction) ([place]) ~ 2000   "light one"  . Nonischemic cardiomyopathy (HCC)    mild  . OSA on CPAP   . Permanent [name] fibrillation ([place])   . Presence of permanent cardiac [name]   .  Pulmonary hypertension ([place])   . Restless legs     Patient Active Problem List   Diagnosis Date Noted  . [name] (congestive heart failure) ([place]) 09/30/2017  . Pancytopenia ([place]) 09/26/2017  . [name] [name] (chronic kidney disease), stage III ([place]) 09/26/2017  . Chronic pain 09/26/2017  . Acute respiratory failure with hypoxia ([place]) 09/26/2017  . Right shoulder injury, initial encounter 04/08/2017  . Chronic [name] fibrillation ([place]) 11/23/2016  . Acute upper GI bleeding 11/23/2016  . Acute delirium 11/07/2016  . Acute confusion   . Acute hyperkalemia   . Secondary hypercoagulable state ([place])   . Transaminitis   . PAH (pulmonary artery hypertension) ([place])   . Acute renal insufficiency 11/03/2016  . Acute on chronic systolic [name] (congestive heart failure) ([place]) 09/09/2015  . Poor short term memory 11/05/2013  . Fatigue 10/13/2011  . Dyspnea on exertion 10/13/2011  . [name] fibrillation, persistent, slow VR 10/13/2011  . [name] single chamber, [place], 2013 10/13/2011  . Restless leg syndrome 10/13/2011  . Chronic anticoagulation 10/13/2011  . Nonischemic cardiomyopathy - coronaries by angiography 2010 10/13/2011  . OSA on CPAP 10/13/2011  . Tremor, hereditary, benign 10/13/2011  . Depression 10/13/2011  . Pulmonary hypertension ([place]) 10/13/2011  . MRSA (methicillin resistant Staphylococcus aureus) carrier 10/13/2011  . BPH (benign prostatic hyperplasia) 10/13/2011    Past Surgical History:  Procedure Laterality Date  . APPENDECTOMY  12/2000   [name] [name] 12/22/2010  . CARDIAC CATHETERIZATION  11/07/2008   nonischemic cardiomyopathy,pulmonary hypertension  .  CATARACT EXTRACTION W/ INTRAOCULAR LENS  IMPLANT, BILATERAL Bilateral   . CIRCUMCISION  12/2005   [name] [name] 12/22/2010  . COLOSTOMY  12/2000   [name] [name] 12/22/2010  . COLOSTOMY REVERSAL  07/2001   [name] [name] 12/22/2010  . CORONARY ANGIOPLASTY  06/01/1999   successful to ostium of the first diagonal  .  ESOPHAGOGASTRODUODENOSCOPY N/A 08/22/2013   Procedure: ESOPHAGOGASTRODUODENOSCOPY (EGD);  Surgeon: [name], MD;  Location: [facility] ENDOSCOPY;  Service: Endoscopy;  Laterality: N/A;  h/p in file cabinet, jackie  . ESOPHAGOGASTRODUODENOSCOPY N/A 11/05/2014   Procedure: ESOPHAGOGASTRODUODENOSCOPY (EGD);  Surgeon: Clarene Essex, MD;  Location: [facility] ENDOSCOPY;  Service: Endoscopy;  Laterality: N/A;  . ESOPHAGOGASTRODUODENOSCOPY N/A 11/08/2016   Procedure: ESOPHAGOGASTRODUODENOSCOPY (EGD);  Surgeon: [name], MD;  Location: [facility] ENDOSCOPY;  Service: Endoscopy;  Laterality: N/A;  . HOT HEMOSTASIS N/A 11/05/2014   Procedure: HOT HEMOSTASIS (ARGON PLASMA COAGULATION/BICAP);  Surgeon: Clarene Essex, MD;  Location: [facility] ENDOSCOPY;  Service: Endoscopy;  Laterality: N/A;  . INSERT / REPLACE / REMOVE [name]    . JOINT REPLACEMENT    . MASS EXCISION Left    hand w/ulnar artery reconstruction/notes 12/22/2010  . NM MYOCAR PERF WALL MOTION  11/24/2007   normal  . PERMANENT [name] INSERTION  10/04/2012   [employer]  . PERMANENT [name] INSERTION N/A 10/12/2011   Procedure: PERMANENT [name] INSERTION;  Surgeon: [name], MD;  Location: [facility] CATH LAB;  Service: Cardiovascular;  Laterality: N/A;  . REPLACEMENT TOTAL KNEE Bilateral 11/2006   right-left/notes 12/22/2010  . ROTATOR CUFF REPAIR Right 09/2004   [name] [name] 12/22/2010  . SAVORY DILATION N/A 08/22/2013   Procedure: SAVORY DILATION;  Surgeon: [name], MD;  Location: [facility] ENDOSCOPY;  Service: Endoscopy;  Laterality: N/A;  . SHOULDER SURGERY Right    "fell off house; messed up 3 things in my arm"  . TONSILLECTOMY    . US ECHOCARDIOGRAPHY  02/01/2011   LA is mod-severely dilated,AOV & root sclerotic,ca+ AOV leaflets        Home Medications    Prior to Admission medications   Medication Sig Start Date End Date Taking? Authorizing Provider  acetaminophen (TYLENOL) 650 MG CR tablet Take 650 mg by mouth every 8 (eight) hours as needed for pain.     [provider]  allopurinol (ZYLOPRIM) 100 MG tablet Take 2 tablets (200 mg total) by mouth daily. 11/09/16   Theodis Blaze, MD  Amino Acids-Protein Hydrolys (FEEDING SUPPLEMENT, PRO-STAT SUGAR FREE 64,) LIQD Take 30 mLs by mouth 2 (two) times daily. 10/02/17   Jani Gravel, MD  apixaban (ELIQUIS) 5 MG TABS tablet Take 5 mg by mouth 2 (two) times daily.    [provider]  colchicine 0.6 MG tablet Take 0.6 mg by mouth daily as needed (gout flare). Colcrys    [provider]  Docusate Calcium (STOOL SOFTENER PO) Take 2 tablets by mouth at bedtime.     [provider]  dutasteride (AVODART) 0.5 MG capsule Take 0.5 mg by mouth daily.    [provider]  escitalopram (LEXAPRO) 5 MG tablet Take 1 tablet (5 mg total) by mouth at bedtime. 11/09/16   Theodis Blaze, MD  ferrous sulfate 325 (65 FE) MG tablet Take 325 mg by mouth daily with breakfast.    [provider]  furosemide (LASIX) 40 MG tablet Take 2 tablets (80 mg total) by mouth See admin instructions. Take 2 tablets (80 mg) by mouth twice daily, Take extra tablet (40 mg) midday x 4 days  only (starting 09/27/17) 10/02/17 11/01/17  Jani Gravel, MD  hydrOXYzine (ATARAX/VISTARIL) 25 MG tablet TAKE 1 TABLET BY MOUTH EVERY 6 HOURS AS NEEDED FOR ITCHING Patient taking differently: TAKE 1 TABLET (25 MG) BY MOUTH EVERY 6 HOURS AS NEEDED FOR ITCHING 07/07/17   Croitoru, Mihai, MD  loratadine (CLARITIN) 10 MG tablet Take 10 mg by mouth daily.    [provider]  mirtazapine (REMERON) 15 MG tablet Take 15 mg by mouth at bedtime.    [provider]  Multiple Vitamin (MULTIVITAMIN WITH MINERALS) TABS tablet Take 1 tablet by mouth daily.    [provider]  Multiple Vitamins-Minerals (PRESERVISION AREDS 2) CAPS Take 1 capsule by mouth daily.    [provider]  nitroGLYCERIN (NITROSTAT) 0.4 MG SL tablet Place 0.4 mg under the tongue every 5 (five) minutes as needed for chest pain.      [provider]  Omega-3 Fatty Acids (OMEGA-3 PO) Take 1 capsule by mouth 2 (two) times daily.    [provider]  pantoprazole (PROTONIX) 40 MG tablet Take 1 tablet (40 mg total) by mouth daily. 11/10/16   Theodis Blaze, MD  potassium chloride (K-DUR) 10 MEQ tablet Take 1 tablet (10 mEq total) by mouth daily. Patient taking differently: Take 10 mEq by mouth 2 (two) times daily.  03/17/17   Croitoru, Mihai, MD  rOPINIRole (REQUIP) 3 MG tablet Take 3 mg by mouth at bedtime.  08/02/15   [provider]  spironolactone (ALDACTONE) 25 MG tablet Take 1 tablet (25 mg total) by mouth daily. 10/03/17   Jani Gravel, MD  Tamsulosin HCl (FLOMAX) 0.4 MG CAPS Take 0.4 mg by mouth daily after breakfast.    [provider]  traMADol (ULTRAM) 50 MG tablet Take 100 mg by mouth 2 (two) times daily. For pain     [provider]    Family History Family History  Problem Relation Age of Onset  . Cancer Mother   . Heart attack Father   . Diabetes Brother     Social History Social History   Tobacco Use  . Smoking status: Former Research scientist (life sciences)  . Smokeless tobacco: Never Used  . Tobacco comment: 09/30/2017 "it's been over 40yr since I smoked anything"  Substance Use Topics  . Alcohol use: No  . Drug use: No     Allergies   Vicodin [hydrocodone-acetaminophen] [name] Hydrocodone   Review of Systems Review of Systems  Constitutional: Positive for fatigue [name] malaise/fatigue. Negative for chills, diaphoresis [name] fever.  HENT: Negative for congestion.   Respiratory: Positive for shortness of breath. Negative for cough, sputum production, chest tightness, wheezing [name] stridor.   Cardiovascular: Positive for chest pain, leg swelling [name] near-syncope. Negative for palpitations [name] syncope.  Gastrointestinal: Positive for blood in stool, nausea [name] vomiting. Negative for abdominal pain, constipation [name] diarrhea.  Musculoskeletal: Negative for back pain, neck pain [name] neck  stiffness.  Skin: Negative for rash.  Neurological: Positive for light-headedness. Negative for syncope, weakness, numbness [name] headaches.  Psychiatric/Behavioral: Negative for agitation.  All other systems reviewed [name] are negative.    Physical Exam Updated Vital Signs BP 95/62 (BP Location: Left Arm)   Pulse (!) 57   Temp 97.7 F (36.5 C) (Oral)   Resp 16   SpO2 99%   Physical Exam  Constitutional: He is oriented to person, place, [name] time. He appears well-developed [name] well-nourished. No distress.  HENT:  Head: Normocephalic.  Nose: Nose normal.  Mouth/Throat: Oropharynx is clear [name]  moist. No oropharyngeal exudate.  Eyes: Pupils are equal, round, [name] reactive to light. Conjunctivae [name] EOM are normal.  Neck: Normal range of motion. Neck supple.  Cardiovascular: Normal rate [name] intact distal pulses.  Murmur heard. Pulmonary/Chest: Effort normal. No stridor. No respiratory distress. He has no wheezes. He has rales. He exhibits no tenderness.  Abdominal: Soft. Bowel sounds are normal. He exhibits no distension. There is no tenderness.  Genitourinary: Rectal exam shows guaiac positive stool.  Musculoskeletal: He exhibits edema. He exhibits no tenderness.  Neurological: He is alert [name] oriented to person, place, [name] time. No sensory deficit. He exhibits normal muscle tone.  Skin: Capillary refill takes less than 2 seconds. He is not diaphoretic. No erythema. There is pallor.  Psychiatric: He has a normal mood [name] affect.  Nursing note [name] vitals reviewed.    ED Treatments / Results  Labs (all labs ordered are listed, but only abnormal results are displayed) Labs Reviewed  BASIC METABOLIC PANEL - Abnormal; Notable for the following components:      Result Value   Sodium 134 (*)    Chloride 94 (*)    Glucose, Bld 108 (*)    BUN 149 (*)    Creatinine, Ser 3.68 (*)    GFR calc non Af Amer 14 (*)    GFR calc Af Amer 16 (*)    All other components within normal limits    CBC - Abnormal; Notable for the following components:   WBC 2.7 (*)    RBC 1.89 (*)    Hemoglobin 7.0 (*)    HCT 20.8 (*)    MCV 110.1 (*)    MCH 37.0 (*)    RDW 18.0 (*)    Platelets 109 (*)    All other components within normal limits  BRAIN NATRIURETIC PEPTIDE - Abnormal; Notable for the following components:   B Natriuretic Peptide 1,728.6 (*)    All other components within normal limits  HEMOGLOBIN A1C - Abnormal; Notable for the following components:   Hgb A1c MFr Bld 4.5 (*)    All other components within normal limits  I-STAT TROPONIN, ED - Abnormal; Notable for the following components:   Troponin i, poc 0.47 (*)    All other components within normal limits  POC OCCULT BLOOD, ED - Abnormal; Notable for the following components:   Fecal Occult Bld POSITIVE (*)    All other components within normal limits  TSH  MAGNESIUM  LIPID PANEL  TYPE [name] SCREEN  PREPARE RBC (CROSSMATCH)    EKG EKG Interpretation  Date/Time:  Thursday November 03 2017 11:42:53 EDT Ventricular Rate:  60 PR Interval:    QRS Duration: 246 QT Interval:  528 QTC Calculation: 528 R Axis:   -75 Text Interpretation:  Wide QRS rhythm Left axis deviation Left bundle branch block Abnormal ECG No significant change since last tracing Left bundle branch block Confirmed by Thomasene Lot Courteney [phone]) on 11/03/2017 12:28:56 PM   Radiology Dg Chest 2 View  Result Date: 11/03/2017 CLINICAL DATA:  84 year old male with history of substernal chest pain [name] shortness of breath yesterday [name] today. EXAM: CHEST - 2 VIEW COMPARISON:  Chest x-ray 09/30/2017. FINDINGS: There is cephalization of the pulmonary vasculature [name] slight indistinctness of the interstitial markings suggestive of mild pulmonary edema. No pleural effusions. Mild cardiomegaly. Upper mediastinal contours are within normal limits. Aortic atherosclerosis. Left sided [name] with single lead projecting over the expected location of the right  ventricular apex. IMPRESSION: 1. The appearance the  chest is most compatible with mild congestive heart failure, as above. 2. Aortic atherosclerosis. Electronically Signed   By: Vinnie Langton M.D.   On: 11/03/2017 12:21   Ct Head Wo Contrast  Result Date: 11/03/2017 CLINICAL DATA:  Multiple falls at home, posttraumatic headache, weakness, bruising on face in arms, history of coronary artery disease post MI, non ischemic cardiomyopathy, [name], stage III chronic kidney disease, hypertension, permanent [name] fibrillation, pulmonary hypertension EXAM: CT HEAD WITHOUT CONTRAST CT CERVICAL SPINE WITHOUT CONTRAST TECHNIQUE: Multidetector CT imaging of the head [name] cervical spine was performed following the standard protocol without intravenous contrast. Multiplanar CT image reconstructions of the cervical spine were also generated. COMPARISON:  04/05/2017 FINDINGS: CT HEAD FINDINGS Brain: Motion artifacts near vertex. Generalized atrophy. Normal ventricular morphology. No midline shift or mass effect. Small vessel chronic ischemic changes of deep cerebral white matter. No intracranial hemorrhage, mass lesion or evidence acute infarction identified within limitations of motion. Small old lacunar infarct at posterior limb of LEFT internal capsule. No extra-axial fluid collections. Vascular: Atherosclerotic calcification of internal carotid arteries at skull base Skull: Grossly intact Sinuses/Orbits: Clear Other: N/A CT CERVICAL SPINE FINDINGS Alignment: Imaging degraded by patient motion. Alignment grossly normal. Skull base [name] vertebrae: Visualized skull base intact. Bones demineralized. Vertebral body heights appear maintained. No definite fracture, subluxation, or bone destruction. Multilevel facet degenerative changes. Soft tissues [name] spinal canal: Prevertebral soft tissues normal thickness. Disc levels: Mild AP narrowing of spinal canal due to endplate spur formation at C5-C6. Disc space narrowing at C4-C5  through C6-C7. Upper chest: Lung apices clear Other: Atherosclerotic calcifications within the carotid systems. IMPRESSION: Motion artifacts on both CT head [name] CT cervical spine exams. Atrophy with small vessel chronic ischemic changes of deep cerebral white matter. Tiny old lacunar infarct at posterior limb LEFT internal capsule. No definite acute intracranial abnormalities. Multilevel degenerative disc [name] facet disease changes of the cervical spine. No definite acute cervical spine abnormalities. Electronically Signed   By: Lavonia Dana M.D.   On: 11/03/2017 18:01   Ct Cervical Spine Wo Contrast  Result Date: 11/03/2017 CLINICAL DATA:  Multiple falls at home, posttraumatic headache, weakness, bruising on face in arms, history of coronary artery disease post MI, non ischemic cardiomyopathy, [name], stage III chronic kidney disease, hypertension, permanent [name] fibrillation, pulmonary hypertension EXAM: CT HEAD WITHOUT CONTRAST CT CERVICAL SPINE WITHOUT CONTRAST TECHNIQUE: Multidetector CT imaging of the head [name] cervical spine was performed following the standard protocol without intravenous contrast. Multiplanar CT image reconstructions of the cervical spine were also generated. COMPARISON:  04/05/2017 FINDINGS: CT HEAD FINDINGS Brain: Motion artifacts near vertex. Generalized atrophy. Normal ventricular morphology. No midline shift or mass effect. Small vessel chronic ischemic changes of deep cerebral white matter. No intracranial hemorrhage, mass lesion or evidence acute infarction identified within limitations of motion. Small old lacunar infarct at posterior limb of LEFT internal capsule. No extra-axial fluid collections. Vascular: Atherosclerotic calcification of internal carotid arteries at skull base Skull: Grossly intact Sinuses/Orbits: Clear Other: N/A CT CERVICAL SPINE FINDINGS Alignment: Imaging degraded by patient motion. Alignment grossly normal. Skull base [name] vertebrae: Visualized skull base  intact. Bones demineralized. Vertebral body heights appear maintained. No definite fracture, subluxation, or bone destruction. Multilevel facet degenerative changes. Soft tissues [name] spinal canal: Prevertebral soft tissues normal thickness. Disc levels: Mild AP narrowing of spinal canal due to endplate spur formation at C5-C6. Disc space narrowing at C4-C5 through C6-C7. Upper chest: Lung apices clear Other: Atherosclerotic calcifications within the carotid systems. IMPRESSION:  Motion artifacts on both CT head [name] CT cervical spine exams. Atrophy with small vessel chronic ischemic changes of deep cerebral white matter. Tiny old lacunar infarct at posterior limb LEFT internal capsule. No definite acute intracranial abnormalities. Multilevel degenerative disc [name] facet disease changes of the cervical spine. No definite acute cervical spine abnormalities. Electronically Signed   By: Lavonia Dana M.D.   On: 11/03/2017 18:01   Dg Chest Port 1 View  Result Date: 11/03/2017 CLINICAL DATA:  PICC line placement EXAM: PORTABLE CHEST 1 VIEW COMPARISON:  Chest x-ray of 11/03/2016 FINDINGS: Right-sided PICC line is now present with the tip seen to the lower SVC. No pneumothorax is seen. Moderate cardiomegaly is present [name] permanent [name] remains. Mild pulmonary vascular congestion cannot be excluded. There are degenerative changes noted in both shoulders. IMPRESSION: 1. Right-sided PICC line placed with the tip seen to the lower SVC. No pneumothorax. 2. Stable moderate cardiomegaly. Cannot exclude mild pulmonary vascular congestion. Electronically Signed   By: Ivar Drape M.D.   On: 11/03/2017 17:16   [place] Ekg Site Rite  Result Date: 11/03/2017 If Site Rite image not attached, placement could not be confirmed due to current cardiac rhythm.   Procedures Procedures (including critical care time)  CRITICAL CARE Performed by: Gwenyth Allegra Tegeler Total critical care time: 35 minutes Critical care time was  exclusive of separately billable procedures [name] treating other patients. Critical care was necessary to treat or prevent imminent or life-threatening deterioration. Critical care was time spent personally by me on the following activities: development of treatment plan with patient [name]/or surrogate as well as nursing, discussions with consultants, evaluation of patient's response to treatment, examination of patient, obtaining history from patient or surrogate, ordering [name] performing treatments [name] interventions, ordering [name] review of laboratory studies, ordering [name] review of radiographic studies, pulse oximetry [name] re-evaluation of patient's condition.   Medications Ordered in ED Medications  sodium chloride flush (NS) 0.9 % injection 10-40 mL (has no administration in time range)  sodium chloride flush (NS) 0.9 % injection 10-40 mL (has no administration in time range)  0.9 %  sodium chloride infusion ( Intravenous New Bag/Given 11/03/17 1844)  LORazepam (ATIVAN) injection 1 mg (1 mg Intramuscular Given 11/03/17 1604)     Initial Impression / Assessment [name] Plan / ED Course  I have reviewed the triage vital signs [name] the nursing notes.  Pertinent labs & imaging results that were available during my care of the patient were reviewed by me [name] considered in my medical decision making (see chart for details).     JURON VORHEES is a 84 y.o. male with a past medical history significant for CAD status post [name], [name] [name], [name] [name], [name] [name], [name] [name] fatigue, exertional chest pain, exertional shortness of breath, black stools, nausea, [name] vomiting.  Patient reports that for the last few days he has been having the constellation of symptoms described above.  He reports that he ran out of his Eliquis 2 days ago [name] his last dose was on Tuesday.  He reports that he was on a vacation trip to the beach.  He says that he has been  feeling very lightheaded since yesterday [name] has had some near syncopal episode.  He denies fully passing out.  He reports that yesterday he began developing chest pain that he describes as similar to prior MI with a tightness [name] pressure in his central chest.  He reports he goes  to his neck.  He reports associated shortness of breath [name] all of his symptoms are worsened with exertion.  He denies any pleuritic pain.  He reports associated nausea vomiting but denies diaphoresis.  He reports having darkened stools for the last few days but denies any abdominal pain or urinary symptoms.  He denies fevers, chills, congestion, or cough.  He denies other complaints.  On arrival, patient was found to be somewhat hypotensive with a blood pressure in the 90s.  On my assessment it had improved.  On my exam, lungs had some crackles in the bases.  Chest [name] abdomen were nontender.  With the report of dark in stool, a fecal occult was performed [name] it was positive.  No gross blood was seen on rectal exam.  No rectal tenderness present.  Abdomen otherwise nontender.  Patient had some edema in his legs.  Patient was alert [name] oriented.  Patient had bruising on his head [name] back from the falls he reports having recently.  Patient will have CT of the head [name] neck given the falls.  I am concerned about fluid overload, symptomatic anemia given the GI bleed, [name] a cardiac cause of his chest pain.  Patient was found to have symptomatic anemia with a hemoglobin of 7.0.  Patient has pancytopenia but has worsened with the anemia.  Fecal occult was positive.  Lab testing also showed acute kidney injury.  BNP severely elevated.  Patient will be given 1 unit of blood [name] will be admitted to hospitalist service.  Radiology will see the patient in consultation as well GI.  I suspect patient has fluid overload given the elevated BNP, crackles on lungs, [name] on x-ray, [name] swollen legs.  Patient will be admitted to hospital service for  further management.    Final Clinical Impressions(s) / ED Diagnoses   Final diagnoses:  Symptomatic anemia  Precordial pain  Exertional shortness of breath  Elevated troponin  Positive fecal occult blood test    Clinical Impression: 1. Symptomatic anemia   2. S/P PICC central line placement   3. Precordial pain   4. Exertional shortness of breath   5. Elevated troponin   6. Positive fecal occult blood test     Disposition: Admit  This note was prepared with assistance of Dragon voice recognition software. Occasional wrong-word or sound-a-like substitutions may have occurred due to the inherent limitations of voice recognition software.     Tegeler, Gwenyth Allegra, MD 11/03/17 2008

## 2017-11-03 NOTE — Progress Notes (Signed)
Attempt PIV with ultrasound x two.  Unable to gain access due to Pt.s movement.  Pt states unable to keep arms still due to pain and restlessness in legs.

## 2017-11-03 NOTE — ED Notes (Signed)
Unable to draw trop due at 2046 d/t blood transfusing.

## 2017-11-03 NOTE — ED Notes (Signed)
CPAP applied by RT

## 2017-11-03 NOTE — ED Notes (Signed)
Attending paged d/t pt's low BP.  Waiting for call back

## 2017-11-03 NOTE — ED Notes (Signed)
Pt is resting and appears comfortable.  Family is at bedside.  PRBC is ready for transfusion, however, pt does not have an IV access at this time.  Pt has been stuck several times without success.  Pt is A&Ox 4.  He denies any cp/SOB/dizziness.

## 2017-11-03 NOTE — Progress Notes (Signed)
Peripherally Inserted Central Catheter/Midline Placement  The IV Nurse has discussed with the patient and/or persons authorized to consent for the patient, the purpose of this procedure and the potential benefits and risks involved with this procedure.  The benefits include less needle sticks, lab draws from the catheter, and the patient may be discharged home with the catheter. Risks include, but not limited to, infection, bleeding, blood clot (thrombus formation), and puncture of an artery; nerve damage and irregular heartbeat and possibility to perform a PICC exchange if needed/ordered by physician.  Alternatives to this procedure were also discussed.  Bard Power PICC patient education guide, fact sheet on infection prevention and patient information card has been provided to patient /or left at bedside.    PICC/Midline Placement Documentation  PICC Double Lumen 06/10/10 PICC Right Basilic 42 cm 0 cm (Active)  Indication for Insertion or Continuance of Line Poor Vasculature-patient has had multiple peripheral attempts or PIVs lasting less than 24 hours 11/03/2017  4:00 PM  Exposed Catheter (cm) 0 cm 11/03/2017  4:00 PM  Site Assessment Clean;Dry;Intact 11/03/2017  4:00 PM  Lumen #1 Status Flushed;Blood return noted 11/03/2017  4:00 PM  Lumen #2 Status Flushed;Blood return noted 11/03/2017  4:00 PM  Dressing Type Transparent 11/03/2017  4:00 PM  Dressing Status Clean;Dry;Intact;Antimicrobial disc in place 11/03/2017  4:00 PM  Dressing Intervention New dressing 11/03/2017  4:00 PM  Dressing Change Due 11/10/17 11/03/2017  4:00 PM    Consent signed by wife at bedside   Synthia Innocent 11/03/2017, 4:47 PM

## 2017-11-03 NOTE — ED Notes (Signed)
RT made aware pt needs to be placed on cpap.  Lovey Newcomer MD made aware of pt needing cpap machine.  Verified blood transfusion order.  She states to transfuse only 2 PRBCs total at this time. Then check Hgb 2 hour after transfusion

## 2017-11-03 NOTE — ED Notes (Signed)
Blood consent signed by his Wife Corin Tilly

## 2017-11-03 NOTE — ED Notes (Signed)
IV Team at bedside to attempt PICC line insertion

## 2017-11-03 NOTE — ED Notes (Signed)
Pt wife asked patient access if she could talk to someone. To ask why her husband was in the waiting room. Patient access came to me to talk to the family members. When coming to the pt and the pt family the wife agressively asked me why her husband is in that waiting room. I told pt wife I will look and see where he is at. Pt wife stated that he needs to be in a bed and should not be out here waiting. I told pt I understand and informed her we are working hard and are going to get him to a room so he can be seen by the provider.

## 2017-11-03 NOTE — H&P (Addendum)
History and Physical    EUAN WANDLER ZOX:[id number] DOB: 1931/09/24 DOA: 11/03/2017  PCP: [name], MD Consultants:  [facility] - cardiology; [facility] - GI Patient coming from:  Home - lives with wife; NOK: wife, [phone]; [phone]  Chief Complaint: CP, DOE  HPI: [name] is a 84 y.o. male with medical history significant of CAD with stents, afib on Eliquis but none since Tuesday (ran out), stage 3 CKD, pulmonary HTN, pacemaker placement, OSA on CPAP, CHF, and HTN presenting with CP, DOE.  He had a rough night last night - he was heaving and felt like he needed to vomit.  Difficulty to walk more than a step or two without severe fatigue.  Very SOB.  He has h/o CHF and has been on low-sodium diet and he seems to be doing well.  He did have a dark stool this AM.  +chest pain, took 2 NTG last night with relief of symptoms.  No sick contacts.  Family wants him to see a kidney doctor.  He does occasionally take NSAIDs.  He took 1 BC powder on Saturday.  He ran out of Tramadol and so was taking NSAIDs/BC powders as a substitute.   ED Course: Symptomatic anemia, likely GI bleed.  Has   Fatigue, CP, and DOE.  Stools "a lot blacker" - takes iron normally.  Returned from the beach yesterday and pre-syncopal.  Heme +.  Hgb 7.  Pancytopenia, chronic.  Troponin 0.4, consulted cardiology.  Consulted GI for occult GI bleeding.  Creatinine 1.5 to 3.6.  Ordered 1 unit of PRBC.    Review of Systems: As per HPI; otherwise review of systems reviewed and negative.   Ambulatory Status:  Ambulates with a cane  Past Medical History:  Diagnosis Date  . Arthritis    "feet" (09/30/2017)  . CHF (congestive heart failure) ([place])   . Chronic bronchitis ([place])   . CKD (chronic kidney disease), stage III ([place])    [name] 09/30/2017  . Coronary artery disease   . Diverticulitis   . GERD (gastroesophageal reflux disease)   . Gout   . History of blood transfusion    "blood loss" (09/30/2017)  .  Hyperlipidemia   . Hypertension   . MI (myocardial infarction) ([place]) ~ 2000   "light one"  . Nonischemic cardiomyopathy (HCC)    mild  . OSA on CPAP   . Permanent atrial fibrillation (HCC)    on Eliquis  . Presence of permanent cardiac pacemaker   . Pulmonary hypertension ([place])   . Restless legs     Past Surgical History:  Procedure Laterality Date  . APPENDECTOMY  12/2000   [name] 12/22/2010  . CARDIAC CATHETERIZATION  11/07/2008   nonischemic cardiomyopathy,pulmonary hypertension  . CATARACT EXTRACTION W/ INTRAOCULAR LENS  IMPLANT, BILATERAL Bilateral   . CIRCUMCISION  12/2005   [name] 12/22/2010  . COLOSTOMY  12/2000   [name] 12/22/2010  . COLOSTOMY REVERSAL  07/2001   [name] 12/22/2010  . CORONARY ANGIOPLASTY  06/01/1999   successful to ostium of the first diagonal  . ESOPHAGOGASTRODUODENOSCOPY N/A 08/22/2013   Procedure: ESOPHAGOGASTRODUODENOSCOPY (EGD);  Surgeon: [name], MD;  Location: [facility] ENDOSCOPY;  Service: Endoscopy;  Laterality: N/A;  h/p in file cabinet, jackie  . ESOPHAGOGASTRODUODENOSCOPY N/A 11/05/2014   Procedure: ESOPHAGOGASTRODUODENOSCOPY (EGD);  Surgeon: Clarene Essex, MD;  Location: [facility] ENDOSCOPY;  Service: Endoscopy;  Laterality: N/A;  . ESOPHAGOGASTRODUODENOSCOPY N/A 11/08/2016   Procedure: ESOPHAGOGASTRODUODENOSCOPY (EGD);  Surgeon: [name], MD;  Location[facility]  ENDOSCOPY;  Service: Endoscopy;  Laterality: N/A;  . HOT HEMOSTASIS N/A 11/05/2014   Procedure: HOT HEMOSTASIS (ARGON PLASMA COAGULATION/BICAP);  Surgeon: Clarene Essex, MD;  Location: [facility] ENDOSCOPY;  Service: Endoscopy;  Laterality: N/A;  . INSERT / REPLACE / REMOVE PACEMAKER    . JOINT REPLACEMENT    . MASS EXCISION Left    hand w/ulnar artery reconstruction/notes 12/22/2010  . NM MYOCAR PERF WALL MOTION  11/24/2007   normal  . PERMANENT PACEMAKER INSERTION  10/04/2012   [employer]  . PERMANENT PACEMAKER INSERTION N/A 10/12/2011   Procedure: PERMANENT PACEMAKER INSERTION;  Surgeon: [name],  MD;  Location: [facility] CATH LAB;  Service: Cardiovascular;  Laterality: N/A;  . REPLACEMENT TOTAL KNEE Bilateral 11/2006   right-left/notes 12/22/2010  . ROTATOR CUFF REPAIR Right 09/2004   [name] 12/22/2010  . SAVORY DILATION N/A 08/22/2013   Procedure: SAVORY DILATION;  Surgeon: [name], MD;  Location: [facility] ENDOSCOPY;  Service: Endoscopy;  Laterality: N/A;  . SHOULDER SURGERY Right    "fell off house; messed up 3 things in my arm"  . TONSILLECTOMY    . US ECHOCARDIOGRAPHY  02/01/2011   LA is mod-severely dilated,AOV & root sclerotic,ca+ AOV leaflets    Social History   Socioeconomic History  . Marital status: Married    Spouse name: Not on file  . Number of children: Not on file  . Years of education: Not on file  . Highest education level: Not on file  Occupational History  . Occupation: retired  Scientific laboratory technician  . Financial resource strain: Not on file  . Food insecurity:    Worry: Not on file    Inability: Not on file  . Transportation needs:    Medical: Not on file    Non-medical: Not on file  Tobacco Use  . Smoking status: Former Research scientist (life sciences)  . Smokeless tobacco: Never Used  . Tobacco comment: 09/30/2017 "it's been over 57yr since I smoked anything"  Substance and Sexual Activity  . Alcohol use: No  . Drug use: No  . Sexual activity: Never  Lifestyle  . Physical activity:    Days per week: Not on file    Minutes per session: Not on file  . Stress: Not on file  Relationships  . Social connections:    Talks on phone: Not on file    Gets together: Not on file    Attends religious service: Not on file    Active member of club or organization: Not on file    Attends meetings of clubs or organizations: Not on file    Relationship status: Not on file  . Intimate partner violence:    Fear of current or ex partner: Not on file    Emotionally abused: Not on file    Physically abused: Not on file    Forced sexual activity: Not on file  Other Topics Concern  . Not on file    Social History Narrative  . Not on file    Allergies  Allergen Reactions  . Vicodin [Hydrocodone-Acetaminophen] Itching  . Hydrocodone Itching    Family History  Problem Relation Age of Onset  . Cancer Mother   . Heart attack Father   . Diabetes Brother     Prior to Admission medications   Medication Sig Start Date End Date Taking? Authorizing Provider  acetaminophen (TYLENOL) 650 MG CR tablet Take 650 mg by mouth every 8 (eight) hours as needed for pain.   Yes [provider]  allopurinol (  ZYLOPRIM) 100 MG tablet Take 2 tablets (200 mg total) by mouth daily. 11/09/16  Yes Theodis Blaze, MD  Amino Acids-Protein Hydrolys (FEEDING SUPPLEMENT, PRO-STAT SUGAR FREE 64,) LIQD Take 30 mLs by mouth 2 (two) times daily. 10/02/17  Yes Jani Gravel, MD  apixaban (ELIQUIS) 5 MG TABS tablet Take 5 mg by mouth 2 (two) times daily.   Yes [provider]  colchicine 0.6 MG tablet Take 0.6 mg by mouth daily as needed (gout flare). Colcrys   Yes [provider]  Docusate Calcium (STOOL SOFTENER PO) Take 2 tablets by mouth at bedtime.    Yes [provider]  dutasteride (AVODART) 0.5 MG capsule Take 0.5 mg by mouth daily.   Yes [provider]  escitalopram (LEXAPRO) 5 MG tablet Take 1 tablet (5 mg total) by mouth at bedtime. 11/09/16  Yes Theodis Blaze, MD  ferrous sulfate 325 (65 FE) MG tablet Take 325 mg by mouth daily with breakfast.   Yes [provider]  furosemide (LASIX) 40 MG tablet Take 80 mg by mouth 2 (two) times daily.   Yes [provider]  hydrOXYzine (ATARAX/VISTARIL) 25 MG tablet TAKE 1 TABLET BY MOUTH EVERY 6 HOURS AS NEEDED FOR ITCHING Patient taking differently: TAKE 1 TABLET (25 MG) BY MOUTH EVERY 6 HOURS AS NEEDED FOR ITCHING 07/07/17  Yes Croitoru, Mihai, MD  loratadine (CLARITIN) 10 MG tablet Take 10 mg by mouth daily.   Yes [provider]  mirtazapine (REMERON) 15 MG tablet Take 15 mg by mouth at bedtime.    Yes [provider]  Multiple Vitamin (MULTIVITAMIN WITH MINERALS) TABS tablet Take 1 tablet by mouth daily.   Yes [provider]  Multiple Vitamins-Minerals (PRESERVISION AREDS 2) CAPS Take 1 capsule by mouth daily.   Yes [provider]  pantoprazole (PROTONIX) 40 MG tablet Take 1 tablet (40 mg total) by mouth daily. 11/10/16  Yes Theodis Blaze, MD  potassium chloride (K-DUR) 10 MEQ tablet Take 1 tablet (10 mEq total) by mouth daily. Patient taking differently: Take 10 mEq by mouth 2 (two) times daily.  03/17/17  Yes Croitoru, Mihai, MD  rOPINIRole (REQUIP) 3 MG tablet Take 3 mg by mouth at bedtime.  08/02/15  Yes [provider]  Tamsulosin HCl (FLOMAX) 0.4 MG CAPS Take 0.4 mg by mouth daily after breakfast.   Yes [provider]  traMADol (ULTRAM) 50 MG tablet Take 100 mg by mouth 2 (two) times daily. For pain    Yes [provider]  furosemide (LASIX) 40 MG tablet Take 2 tablets (80 mg total) by mouth See admin instructions. Take 2 tablets (80 mg) by mouth twice daily, Take extra tablet (40 mg) midday x 4 days only (starting 09/27/17) 10/02/17 11/01/17  Jani Gravel, MD  nitroGLYCERIN (NITROSTAT) 0.4 MG SL tablet Place 0.4 mg under the tongue every 5 (five) minutes as needed for chest pain.     [provider]  spironolactone (ALDACTONE) 25 MG tablet Take 1 tablet (25 mg total) by mouth daily. Patient not taking: Reported on 11/03/2017 10/03/17   Jani Gravel, MD    Physical Exam: Vitals:   11/03/17 1315 11/03/17 1330 11/03/17 1345 11/03/17 1415  BP: (!) 91/49 (!) 94/51 101/60 (!) 99/54  Pulse: (!) 59 60 (!) 59 (!) 59  Resp: 19 16 16 15   Temp:      TempSrc:      SpO2: 99% 95% 92% 100%     General:  Appears calm  and comfortable and is NAD Eyes:  PERRL, EOMI, normal lids, iris; ecchymosis adjacent to right upper lateral eye ENT:  grossly normal hearing, lips & tongue, mmm Neck:  no LAD, masses or thyromegaly Cardiovascular:  RRR,  no m/r/g. No LE edema.  Respiratory:   CTA bilaterally with no wheezes/rales/rhonchi.  Normal respiratory effort. Abdomen:  soft, +epigastric TTP, ND, NABS Skin: scattered ecchymoses that he attributes to Eliquis Musculoskeletal:  grossly normal tone BUE/BLE, good ROM, no bony abnormality Psychiatric:  blunted mood and affect, speech fluent and appropriate, AOx3 Neurologic:  CN 2-12 grossly intact, moves all extremities in coordinated fashion, sensation intact, intermittent spontaneous leg movements     Radiological Exams on Admission: Dg Chest 2 View  Result Date: 11/03/2017 CLINICAL DATA:  84 year old male with history of substernal chest pain and shortness of breath yesterday and today. EXAM: CHEST - 2 VIEW COMPARISON:  Chest x-ray 09/30/2017. FINDINGS: There is cephalization of the pulmonary vasculature and slight indistinctness of the interstitial markings suggestive of mild pulmonary edema. No pleural effusions. Mild cardiomegaly. Upper mediastinal contours are within normal limits. Aortic atherosclerosis. Left sided pacemaker with single lead projecting over the expected location of the right ventricular apex. IMPRESSION: 1. The appearance the chest is most compatible with mild congestive heart failure, as above. 2. Aortic atherosclerosis. Electronically Signed   By: Vinnie Langton M.D.   On: 11/03/2017 12:21   [place] Ekg Site Rite  Result Date: 11/03/2017 If Site Rite image not attached, placement could not be confirmed due to current cardiac rhythm.   EKG: Independently reviewed.  NSR with rate 60; LBBB, NSCSLT  Labs on Admission: I have personally reviewed the available labs and imaging studies at the time of the admission.  Pertinent labs:   Glucose 108 BUN 149/Creatinine 3.68/GFR 14; 49/1.47/42 on 2/24 Troponin 0.47; 0.12 on 2/23 WBC 2.7 - stable Hgb 7.0; 8.7 on 2/24 Plt 109 - stable Heme positive BNP 1728.6 Normal TSH   Assessment/Plan Principal Problem:   GI  bleed Active Problems:   Chronic anticoagulation   OSA on CPAP   Chronic atrial fibrillation (HCC)   Acute upper GI bleeding   CHF (congestive heart failure) (HCC)   Acute renal failure superimposed on stage 3 chronic kidney disease (HCC)   Chest pain   GI bleed -Patient's SOB and fatigue are most likely caused by anemia secondary to upper GI bleeding.  -Patient has history of erosive gastropathy and a cratered duodenal ulcer in 4/18 -He acknowledges some use of NSAIDs and BC powders - although he is vague in his report of how much he has been using -His Hgb decreased from 8.7 on 2/24 to 7 today.  -The patient is currently with soft blood pressure, suggesting acute volume loss.  -Type and screen were done in ED.  -One unit of blood was ordered by ED; will order 1 extra unit of blood at this time given his cardiac issues.  - will admit to SDU bed - GI consulted by ED, will follow up recommendations - NPO for possible EGD - LR at 100 mL/hr while not receiving blood products - Start IV pantoprazole drip - Zofran IV for nausea - Avoid NSAIDs and SQ heparin - Maintain IV access (2 large bore IVs if possible). - Monitor closely and follow cbc, transfuse as necessary. - Needs PICC or midline for access issues - Hold Eliquis (see below) - last dose appears to have been Tuesday  Chest pain -Likely GI in nature -CXR unremarkable.   -  Initial cardiac troponin positive, but also with elevated creatinine; will trend.  -EKG not indicative of acute ischemia.   -Cardiology has been consulted by ER -cycle troponin q6h x 3 and repeat EKG in AM -morphine given -Risk factor stratification with HgbA1c and FLP; will also check TSH (normal) -BNP is elevated and patient does have chronic CHF; however, he appears to be volume depleted in the setting of GI bleed at this time and with abnormal creatinine this may be spurious elevation -No Lasix/diuretics for now  Acute on chronic renal failure -As  noted above, appears to be volume depleted -Will hydrate and transfuse and continue to follow -Avoid nephrotoxic agents  Afib on Eliquis -Rate controlled due to pacer -Hold Eliquis -He has h/o GI bleeds and many ecchymoses - consider risks/benefits to determine if this is his best option  OSA -Continue CPAP  DVT prophylaxis:  SCDs Code Status:  Full - confirmed with patient/family Family Communication: Wife and friend present throughout evaluation Disposition Plan:  Home once clinically improved Consults called: Cardiology, GI  Admission status: Admit - It is my clinical opinion that admission to INPATIENT is reasonable and necessary because of the expectation that this patient will require hospital care that crosses at least 2 midnights to treat this condition based on the medical complexity of the problems presented.  Given the aforementioned information, the predictability of an adverse outcome is felt to be significant.  NOTE: Patient has been unable to get access.  Obviously, in the setting of a GI bleed it is essential to have access.  Will request PICC/midline despite Acute on Chronic renal insufficiency.    Total critical care time: 55 minutes Critical care time was exclusive of separately billable procedures and treating other patients. Critical care was necessary to treat or prevent imminent or life-threatening deterioration. Critical care was time spent personally by me on the following activities: development of treatment plan with patient and/or surrogate as well as nursing, discussions with consultants, evaluation of patient's response to treatment, examination of patient, obtaining history from patient or surrogate, ordering and performing treatments and interventions, ordering and review of laboratory studies, ordering and review of radiographic studies, pulse oximetry and re-evaluation of patient's condition.   Karmen Bongo MD [facility]  If note is  complete, please contact covering daytime or nighttime physician. [email or web address] Password [facility]  11/03/2017, 3:24 PM

## 2017-11-04 ENCOUNTER — Other Ambulatory Visit: Payer: Self-pay

## 2017-11-04 DIAGNOSIS — R578 Other shock: Secondary | ICD-10-CM | POA: Insufficient documentation

## 2017-11-04 DIAGNOSIS — I482 Chronic atrial fibrillation: Secondary | ICD-10-CM

## 2017-11-04 DIAGNOSIS — I2729 Other secondary pulmonary hypertension: Secondary | ICD-10-CM

## 2017-11-04 LAB — LIPID PANEL
CHOLESTEROL: 117 mg/dL (ref 0–200)
HDL: 41 mg/dL (ref >40)
LDL Cholesterol: 64 mg/dL (ref 0–99)
TRIGLYCERIDES: 61 mg/dL (ref <150)
Total CHOL/HDL Ratio: 2.9 RATIO
VLDL: 12 mg/dL (ref 0–40)

## 2017-11-04 LAB — BASIC METABOLIC PANEL
Anion gap: 14 (ref 5–15)
BUN: 113 mg/dL — ABNORMAL HIGH (ref 6–20)
CO2: 24 mmol/L (ref 22–32)
Calcium: 8.6 mg/dL — ABNORMAL LOW (ref 8.9–10.3)
Chloride: 101 mmol/L (ref 101–111)
Creatinine, Ser: 2.18 mg/dL — ABNORMAL HIGH (ref 0.61–1.24)
GFR calc non Af Amer: 26 mL/min — ABNORMAL LOW (ref >60)
GFR, EST AFRICAN AMERICAN: 30 mL/min — AB (ref >60)
GLUCOSE: 70 mg/dL (ref 65–99)
POTASSIUM: 4 mmol/L (ref 3.5–5.1)
Sodium: 139 mmol/L (ref 135–145)

## 2017-11-04 LAB — CBC
HEMATOCRIT: 24.9 % — AB (ref 39.0–52.0)
HEMOGLOBIN: 8 g/dL — AB (ref 13.0–17.0)
MCH: 34 pg (ref 26.0–34.0)
MCHC: 32.1 g/dL (ref 30.0–36.0)
MCV: 106 fL — AB (ref 78.0–100.0)
Platelets: 108 10*3/uL — ABNORMAL LOW (ref 150–400)
RBC: 2.35 MIL/uL — AB (ref 4.22–5.81)
RDW: 20.6 % — ABNORMAL HIGH (ref 11.5–15.5)
WBC: 5.4 10*3/uL (ref 4.0–10.5)

## 2017-11-04 LAB — HEMOGLOBIN AND HEMATOCRIT, BLOOD
HEMATOCRIT: 21.9 % — AB (ref 39.0–52.0)
Hemoglobin: 7.2 g/dL — ABNORMAL LOW (ref 13.0–17.0)

## 2017-11-04 LAB — TROPONIN I
TROPONIN I: 2.3 ng/mL — AB (ref <0.03)
Troponin I: 2.64 ng/mL (ref <0.03)
Troponin I: 2.56 ng/mL (ref <0.03)

## 2017-11-04 MED ORDER — ORAL CARE MOUTH RINSE
15.0000 mL | Freq: Two times a day (BID) | OROMUCOSAL | Status: DC
  Administered 2017-11-04 – 2017-11-08 (×4): 15 mL via OROMUCOSAL

## 2017-11-04 MED ORDER — TAMSULOSIN HCL 0.4 MG PO CAPS
0.4000 mg | ORAL_CAPSULE | Freq: Every day | ORAL | Status: DC
  Administered 2017-11-05 – 2017-11-08 (×4): 0.4 mg via ORAL
  Filled 2017-11-04 (×4): qty 1

## 2017-11-04 NOTE — ED Notes (Signed)
No new orders regarding critical troponin

## 2017-11-04 NOTE — H&P (View-Only) (Signed)
[facility] Consultation Note  Referring Provider: Dr. Verneita Griffes Primary Care Physician:  Osborne Casco [name] Him, MD Primary Gastroenterologist:  Dr. Watt Climes  Reason for Consultation:  Anemia, hemoccult positive stool  HPI: [name] is a 84 y.o. male pacemaker, afib on apixaban (last dose 3 days ago), CHF, presenting to hospital with pre-syncope and weakness.   Iron-->dark stools chronically, but stools more dark and tarry over the past few weeks.  Some loose stool few days ago.  Chronic pain; on tramadol; ran out of this, and has been taking naproxen and BC powders.  EGD 2018 showed gastritis and duodenal ulcer; EGD 2015/2016 has shown some gastric avms.  CKD, baseline Hgb ~9, but was 7 on admission.  Is taking PPI.   Past Medical History:  Diagnosis Date  . Arthritis    "feet" (09/30/2017)  . CHF (congestive heart failure) ([place])   . Chronic bronchitis ([place])   . CKD (chronic kidney disease), stage III ([place])    [name] 09/30/2017  . Coronary artery disease   . Diverticulitis   . GERD (gastroesophageal reflux disease)   . Gout   . History of blood transfusion    "blood loss" (09/30/2017)  . Hyperlipidemia   . Hypertension   . MI (myocardial infarction) ([place]) ~ 2000   "light one"  . Nonischemic cardiomyopathy (HCC)    mild  . OSA on CPAP   . Permanent atrial fibrillation (HCC)    on Eliquis  . Presence of permanent cardiac pacemaker   . Pulmonary hypertension ([place])   . Restless legs     Past Surgical History:  Procedure Laterality Date  . APPENDECTOMY  12/2000   [name] 12/22/2010  . CARDIAC CATHETERIZATION  11/07/2008   nonischemic cardiomyopathy,pulmonary hypertension  . CATARACT EXTRACTION W/ INTRAOCULAR LENS  IMPLANT, BILATERAL Bilateral   . CIRCUMCISION  12/2005   [name] 12/22/2010  . COLOSTOMY  12/2000   [name] 12/22/2010  . COLOSTOMY REVERSAL  07/2001   [name] 12/22/2010  . CORONARY ANGIOPLASTY  06/01/1999   successful to ostium of the first diagonal  .  ESOPHAGOGASTRODUODENOSCOPY N/A 08/22/2013   Procedure: ESOPHAGOGASTRODUODENOSCOPY (EGD);  Surgeon: [name], MD;  Location: [facility] ENDOSCOPY;  Service: Endoscopy;  Laterality: N/A;  h/p in file cabinet, jackie  . ESOPHAGOGASTRODUODENOSCOPY N/A 11/05/2014   Procedure: ESOPHAGOGASTRODUODENOSCOPY (EGD);  Surgeon: Clarene Essex, MD;  Location: [facility] ENDOSCOPY;  Service: Endoscopy;  Laterality: N/A;  . ESOPHAGOGASTRODUODENOSCOPY N/A 11/08/2016   Procedure: ESOPHAGOGASTRODUODENOSCOPY (EGD);  Surgeon: [name], MD;  Location: [facility] ENDOSCOPY;  Service: Endoscopy;  Laterality: N/A;  . HOT HEMOSTASIS N/A 11/05/2014   Procedure: HOT HEMOSTASIS (ARGON PLASMA COAGULATION/BICAP);  Surgeon: Clarene Essex, MD;  Location: [facility] ENDOSCOPY;  Service: Endoscopy;  Laterality: N/A;  . INSERT / REPLACE / REMOVE PACEMAKER    . JOINT REPLACEMENT    . MASS EXCISION Left    hand w/ulnar artery reconstruction/notes 12/22/2010  . NM MYOCAR PERF WALL MOTION  11/24/2007   normal  . PERMANENT PACEMAKER INSERTION  10/04/2012   [employer]  . PERMANENT PACEMAKER INSERTION N/A 10/12/2011   Procedure: PERMANENT PACEMAKER INSERTION;  Surgeon: [name], MD;  Location: [place] CATH LAB;  Service: Cardiovascular;  Laterality: N/A;  . REPLACEMENT TOTAL KNEE Bilateral 11/2006   right-left/notes 12/22/2010  . ROTATOR CUFF REPAIR Right 09/2004   [name] 12/22/2010  . SAVORY DILATION N/A 08/22/2013   Procedure: SAVORY DILATION;  Surgeon: [name], MD;  Location: [facility] ENDOSCOPY;  Service: Endoscopy;  Laterality: N/A;  .  SHOULDER SURGERY Right    "fell off house; messed up 3 things in my arm"  . TONSILLECTOMY    . US ECHOCARDIOGRAPHY  02/01/2011   LA is mod-severely dilated,AOV & root sclerotic,ca+ AOV leaflets    Prior to Admission medications   Medication Sig Start Date End Date Taking? Authorizing Provider  acetaminophen (TYLENOL) 650 MG CR tablet Take 650 mg by mouth every 8 (eight) hours as needed for pain.   Yes [provider]   allopurinol (ZYLOPRIM) 100 MG tablet Take 2 tablets (200 mg total) by mouth daily. 11/09/16  Yes Theodis Blaze, MD  Amino Acids-Protein Hydrolys (FEEDING SUPPLEMENT, PRO-STAT SUGAR FREE 64,) LIQD Take 30 mLs by mouth 2 (two) times daily. 10/02/17  Yes Jani Gravel, MD  apixaban (ELIQUIS) 5 MG TABS tablet Take 5 mg by mouth 2 (two) times daily.   Yes [provider]  colchicine 0.6 MG tablet Take 0.6 mg by mouth daily as needed (gout flare). Colcrys   Yes [provider]  Docusate Calcium (STOOL SOFTENER PO) Take 2 tablets by mouth at bedtime.    Yes [provider]  dutasteride (AVODART) 0.5 MG capsule Take 0.5 mg by mouth daily.   Yes [provider]  escitalopram (LEXAPRO) 5 MG tablet Take 1 tablet (5 mg total) by mouth at bedtime. 11/09/16  Yes Theodis Blaze, MD  ferrous sulfate 325 (65 FE) MG tablet Take 325 mg by mouth daily with breakfast.   Yes [provider]  furosemide (LASIX) 40 MG tablet Take 80 mg by mouth 2 (two) times daily.   Yes [provider]  hydrOXYzine (ATARAX/VISTARIL) 25 MG tablet TAKE 1 TABLET BY MOUTH EVERY 6 HOURS AS NEEDED FOR ITCHING Patient taking differently: TAKE 1 TABLET (25 MG) BY MOUTH EVERY 6 HOURS AS NEEDED FOR ITCHING 07/07/17  Yes Croitoru, Mihai, MD  loratadine (CLARITIN) 10 MG tablet Take 10 mg by mouth daily.   Yes [provider]  mirtazapine (REMERON) 15 MG tablet Take 15 mg by mouth at bedtime.   Yes [provider]  Multiple Vitamin (MULTIVITAMIN WITH MINERALS) TABS tablet Take 1 tablet by mouth daily.   Yes [provider]  Multiple Vitamins-Minerals (PRESERVISION AREDS 2) CAPS Take 1 capsule by mouth daily.   Yes [provider]  pantoprazole (PROTONIX) 40 MG tablet Take 1 tablet (40 mg total) by mouth daily. 11/10/16  Yes Theodis Blaze, MD  potassium chloride (K-DUR) 10 MEQ tablet Take 1 tablet (10 mEq total) by mouth daily. Patient taking differently: Take 10 mEq by  mouth 2 (two) times daily.  03/17/17  Yes Croitoru, Mihai, MD  rOPINIRole (REQUIP) 3 MG tablet Take 3 mg by mouth at bedtime.  08/02/15  Yes [provider]  Tamsulosin HCl (FLOMAX) 0.4 MG CAPS Take 0.4 mg by mouth daily after breakfast.   Yes [provider]  traMADol (ULTRAM) 50 MG tablet Take 100 mg by mouth 2 (two) times daily. For pain    Yes [provider]  furosemide (LASIX) 40 MG tablet Take 2 tablets (80 mg total) by mouth See admin instructions. Take 2 tablets (80 mg) by mouth twice daily, Take extra tablet (40 mg) midday x 4 days only (starting 09/27/17) 10/02/17 11/01/17  Jani Gravel, MD  nitroGLYCERIN (NITROSTAT) 0.4 MG SL tablet Place 0.4 mg under the tongue every 5 (five) minutes as needed for chest pain.     [provider]  spironolactone (ALDACTONE) 25 MG tablet Take 1  tablet (25 mg total) by mouth daily. Patient not taking: Reported on 11/03/2017 10/03/17   Jani Gravel, MD    Current Facility-Administered Medications  Medication Dose Route Frequency Provider Last Rate Last Dose  . acetaminophen (TYLENOL) tablet 650 mg  650 mg Oral Q4H PRN Karmen Bongo, MD      . allopurinol (ZYLOPRIM) tablet 200 mg  200 mg Oral Daily Karmen Bongo, MD      . dutasteride (AVODART) capsule 0.5 mg  0.5 mg Oral Daily Karmen Bongo, MD      . escitalopram (LEXAPRO) tablet 5 mg  5 mg Oral Ivery Quale, MD      . lactated ringers infusion   Intravenous Continuous Karmen Bongo, MD 100 mL/hr at 11/04/17 0532    . mirtazapine (REMERON) tablet 15 mg  15 mg Oral QHS Karmen Bongo, MD      . morphine 4 MG/ML injection 2 mg  2 mg Intravenous Q2H PRN Karmen Bongo, MD      . ondansetron [facility]) injection 4 mg  4 mg Intravenous Q6H PRN Karmen Bongo, MD      . pantoprazole (PROTONIX) 80 mg in sodium chloride 0.9 % 250 mL (0.32 mg/mL) infusion  8 mg/hr Intravenous Continuous Karmen Bongo, MD 25 mL/hr at 11/04/17 0532 8 mg/hr at 11/04/17 0532  . rOPINIRole  (REQUIP) tablet 3 mg  3 mg Oral QHS Karmen Bongo, MD      . sodium chloride flush (NS) 0.9 % injection 10-40 mL  10-40 mL Intracatheter Q12H Karmen Bongo, MD      . sodium chloride flush (NS) 0.9 % injection 10-40 mL  10-40 mL Intracatheter PRN Karmen Bongo, MD      . tamsulosin (FLOMAX) capsule 0.4 mg  0.4 mg Oral QPC breakfast Nita Sells, MD       Current Outpatient Medications  Medication Sig Dispense Refill  . acetaminophen (TYLENOL) 650 MG CR tablet Take 650 mg by mouth every 8 (eight) hours as needed for pain.    [name] allopurinol (ZYLOPRIM) 100 MG tablet Take 2 tablets (200 mg total) by mouth daily. 30 tablet 1  . Amino Acids-Protein Hydrolys (FEEDING SUPPLEMENT, PRO-STAT SUGAR FREE 64,) LIQD Take 30 mLs by mouth 2 (two) times daily. 900 mL 0  . apixaban (ELIQUIS) 5 MG TABS tablet Take 5 mg by mouth 2 (two) times daily.    . colchicine 0.6 MG tablet Take 0.6 mg by mouth daily as needed (gout flare). Colcrys    . Docusate Calcium (STOOL SOFTENER PO) Take 2 tablets by mouth at bedtime.     . dutasteride (AVODART) 0.5 MG capsule Take 0.5 mg by mouth daily.    [name] escitalopram (LEXAPRO) 5 MG tablet Take 1 tablet (5 mg total) by mouth at bedtime. 30 tablet 0  . ferrous sulfate 325 (65 FE) MG tablet Take 325 mg by mouth daily with breakfast.    . furosemide (LASIX) 40 MG tablet Take 80 mg by mouth 2 (two) times daily.    . hydrOXYzine (ATARAX/VISTARIL) 25 MG tablet TAKE 1 TABLET BY MOUTH EVERY 6 HOURS AS NEEDED FOR ITCHING (Patient taking differently: TAKE 1 TABLET (25 MG) BY MOUTH EVERY 6 HOURS AS NEEDED FOR ITCHING) 90 tablet 2  . loratadine (CLARITIN) 10 MG tablet Take 10 mg by mouth daily.    . mirtazapine (REMERON) 15 MG tablet Take 15 mg by mouth at bedtime.    . Multiple Vitamin (MULTIVITAMIN WITH MINERALS) TABS tablet Take 1 tablet by mouth daily.    [name]  Multiple Vitamins-Minerals (PRESERVISION AREDS 2) CAPS Take 1 capsule by mouth daily.    . pantoprazole (PROTONIX) 40 MG  tablet Take 1 tablet (40 mg total) by mouth daily. 30 tablet 1  . potassium chloride (K-DUR) 10 MEQ tablet Take 1 tablet (10 mEq total) by mouth daily. (Patient taking differently: Take 10 mEq by mouth 2 (two) times daily. ) 90 tablet 3  . rOPINIRole (REQUIP) 3 MG tablet Take 3 mg by mouth at bedtime.     . Tamsulosin HCl (FLOMAX) 0.4 MG CAPS Take 0.4 mg by mouth daily after breakfast.    . traMADol (ULTRAM) 50 MG tablet Take 100 mg by mouth 2 (two) times daily. For pain     . furosemide (LASIX) 40 MG tablet Take 2 tablets (80 mg total) by mouth See admin instructions. Take 2 tablets (80 mg) by mouth twice daily, Take extra tablet (40 mg) midday x 4 days only (starting 09/27/17) 60 tablet 0  . nitroGLYCERIN (NITROSTAT) 0.4 MG SL tablet Place 0.4 mg under the tongue every 5 (five) minutes as needed for chest pain.     [name] spironolactone (ALDACTONE) 25 MG tablet Take 1 tablet (25 mg total) by mouth daily. (Patient not taking: Reported on 11/03/2017) 30 tablet 0    Allergies as of 11/03/2017 - Review Complete 11/03/2017  Allergen Reaction Noted  . Vicodin [hydrocodone-acetaminophen] Itching 08/22/2013  . Hydrocodone Itching 10/08/2011    Family History  Problem Relation Age of Onset  . Cancer Mother   . Heart attack Father   . Diabetes Brother     Social History   Socioeconomic History  . Marital status: Married    Spouse name: Not on file  . Number of children: Not on file  . Years of education: Not on file  . Highest education level: Not on file  Occupational History  . Occupation: retired  Scientific laboratory technician  . Financial resource strain: Not on file  . Food insecurity:    Worry: Not on file    Inability: Not on file  . Transportation needs:    Medical: Not on file    Non-medical: Not on file  Tobacco Use  . Smoking status: Former Research scientist (life sciences)  . Smokeless tobacco: Never Used  . Tobacco comment: 09/30/2017 "it's been over 82yr since I smoked anything"  Substance and Sexual Activity  .  Alcohol use: No  . Drug use: No  . Sexual activity: Never  Lifestyle  . Physical activity:    Days per week: Not on file    Minutes per session: Not on file  . Stress: Not on file  Relationships  . Social connections:    Talks on phone: Not on file    Gets together: Not on file    Attends religious service: Not on file    Active member of club or organization: Not on file    Attends meetings of clubs or organizations: Not on file    Relationship status: Not on file  . Intimate partner violence:    Fear of current or ex partner: Not on file    Emotionally abused: Not on file    Physically abused: Not on file    Forced sexual activity: Not on file  Other Topics Concern  . Not on file  Social History Narrative  . Not on file    Review of Systems: As per HPI, all others negative.  Physical Exam: Vital signs in last 24 hours: Temp:  [97.7 F (36.5 C)-98.7 F (  37.1 C)] 98.4 F (36.9 C) (03/29 0600) Pulse Rate:  [37-64] 60 (03/29 0900) Resp:  [12-23] 16 (03/29 0900) BP: (89-141)/(49-78) 110/54 (03/29 0900) SpO2:  [77 %-100 %] 92 % (03/29 0900)   General:   Alert,  Well-developed, well-nourished, pleasant and cooperative in NAD Head:  Normocephalic and atraumatic. Eyes:  Sclera clear, no icterus.   Conjunctiva pale Ears:  Normal auditory acuity. Nose:  No deformity, discharge,  or lesions. Mouth:  No deformity or lesions.  Oropharynx pale and dry Neck:  Supple; no masses or thyromegaly. Lungs:  Clear throughout to auscultation.   No wheezes, crackles, or rhonchi. No acute distress. Heart:  Regular rate and rhythm; IV/VI SEM LUSB, no other clicks, rubs,  or gallops. Abdomen:  Soft, nontender and nondistended. No masses, hepatosplenomegaly or hernias noted. Normal bowel sounds, without guarding, and without rebound.     Msk:  Symmetrical without gross deformities. Normal posture. Pulses:  Normal pulses noted. Extremities:  Without clubbing or edema. Neurologic:  Alert and   oriented x4; diffusely weak, otherwise grossly normal neurologically. Skin:  Scattered ecchymoses, otherwise intact without significant lesions or rashes. Cervical Nodes:  No significant cervical adenopathy. Psych:  Alert and cooperative. Normal mood and affect.   Lab Results: Recent Labs    11/03/17 1153 11/04/17 0458  WBC 2.7* 5.4  HGB 7.0* 8.0*  HCT 20.8* 24.9*  PLT 109* 108*   BMET Recent Labs    11/03/17 1153  NA 134*  K 4.7  CL 94*  CO2 27  GLUCOSE 108*  BUN 149*  CREATININE 3.68*  CALCIUM 9.0   LFT No results for input(s): PROT, ALBUMIN, AST, ALT, ALKPHOS, BILITOT, BILIDIR, IBILI in the last 72 hours. PT/INR No results for input(s): LABPROT, INR in the last 72 hours.  Studies/Results: Dg Chest 2 View  Result Date: 11/03/2017 CLINICAL DATA:  84 year old male with history of substernal chest pain and shortness of breath yesterday and today. EXAM: CHEST - 2 VIEW COMPARISON:  Chest x-ray 09/30/2017. FINDINGS: There is cephalization of the pulmonary vasculature and slight indistinctness of the interstitial markings suggestive of mild pulmonary edema. No pleural effusions. Mild cardiomegaly. Upper mediastinal contours are within normal limits. Aortic atherosclerosis. Left sided pacemaker with single lead projecting over the expected location of the right ventricular apex. IMPRESSION: 1. The appearance the chest is most compatible with mild congestive heart failure, as above. 2. Aortic atherosclerosis. Electronically Signed   By: Vinnie Langton M.D.   On: 11/03/2017 12:21   Ct Head Wo Contrast  Result Date: 11/03/2017 CLINICAL DATA:  Multiple falls at home, posttraumatic headache, weakness, bruising on face in arms, history of coronary artery disease post MI, non ischemic cardiomyopathy, CHF, stage III chronic kidney disease, hypertension, permanent atrial fibrillation, pulmonary hypertension EXAM: CT HEAD WITHOUT CONTRAST CT CERVICAL SPINE WITHOUT CONTRAST TECHNIQUE:  Multidetector CT imaging of the head and cervical spine was performed following the standard protocol without intravenous contrast. Multiplanar CT image reconstructions of the cervical spine were also generated. COMPARISON:  04/05/2017 FINDINGS: CT HEAD FINDINGS Brain: Motion artifacts near vertex. Generalized atrophy. Normal ventricular morphology. No midline shift or mass effect. Small vessel chronic ischemic changes of deep cerebral white matter. No intracranial hemorrhage, mass lesion or evidence acute infarction identified within limitations of motion. Small old lacunar infarct at posterior limb of LEFT internal capsule. No extra-axial fluid collections. Vascular: Atherosclerotic calcification of internal carotid arteries at skull base Skull: Grossly intact Sinuses/Orbits: Clear Other: N/A CT CERVICAL SPINE FINDINGS Alignment: Imaging  degraded by patient motion. Alignment grossly normal. Skull base and vertebrae: Visualized skull base intact. Bones demineralized. Vertebral body heights appear maintained. No definite fracture, subluxation, or bone destruction. Multilevel facet degenerative changes. Soft tissues and spinal canal: Prevertebral soft tissues normal thickness. Disc levels: Mild AP narrowing of spinal canal due to endplate spur formation at C5-C6. Disc space narrowing at C4-C5 through C6-C7. Upper chest: Lung apices clear Other: Atherosclerotic calcifications within the carotid systems. IMPRESSION: Motion artifacts on both CT head and CT cervical spine exams. Atrophy with small vessel chronic ischemic changes of deep cerebral white matter. Tiny old lacunar infarct at posterior limb LEFT internal capsule. No definite acute intracranial abnormalities. Multilevel degenerative disc and facet disease changes of the cervical spine. No definite acute cervical spine abnormalities. Electronically Signed   By: Lavonia Dana M.D.   On: 11/03/2017 18:01   Ct Cervical Spine Wo Contrast  Result Date:  11/03/2017 CLINICAL DATA:  Multiple falls at home, posttraumatic headache, weakness, bruising on face in arms, history of coronary artery disease post MI, non ischemic cardiomyopathy, CHF, stage III chronic kidney disease, hypertension, permanent atrial fibrillation, pulmonary hypertension EXAM: CT HEAD WITHOUT CONTRAST CT CERVICAL SPINE WITHOUT CONTRAST TECHNIQUE: Multidetector CT imaging of the head and cervical spine was performed following the standard protocol without intravenous contrast. Multiplanar CT image reconstructions of the cervical spine were also generated. COMPARISON:  04/05/2017 FINDINGS: CT HEAD FINDINGS Brain: Motion artifacts near vertex. Generalized atrophy. Normal ventricular morphology. No midline shift or mass effect. Small vessel chronic ischemic changes of deep cerebral white matter. No intracranial hemorrhage, mass lesion or evidence acute infarction identified within limitations of motion. Small old lacunar infarct at posterior limb of LEFT internal capsule. No extra-axial fluid collections. Vascular: Atherosclerotic calcification of internal carotid arteries at skull base Skull: Grossly intact Sinuses/Orbits: Clear Other: N/A CT CERVICAL SPINE FINDINGS Alignment: Imaging degraded by patient motion. Alignment grossly normal. Skull base and vertebrae: Visualized skull base intact. Bones demineralized. Vertebral body heights appear maintained. No definite fracture, subluxation, or bone destruction. Multilevel facet degenerative changes. Soft tissues and spinal canal: Prevertebral soft tissues normal thickness. Disc levels: Mild AP narrowing of spinal canal due to endplate spur formation at C5-C6. Disc space narrowing at C4-C5 through C6-C7. Upper chest: Lung apices clear Other: Atherosclerotic calcifications within the carotid systems. IMPRESSION: Motion artifacts on both CT head and CT cervical spine exams. Atrophy with small vessel chronic ischemic changes of deep cerebral white matter.  Tiny old lacunar infarct at posterior limb LEFT internal capsule. No definite acute intracranial abnormalities. Multilevel degenerative disc and facet disease changes of the cervical spine. No definite acute cervical spine abnormalities. Electronically Signed   By: Lavonia Dana M.D.   On: 11/03/2017 18:01   Dg Chest Port 1 View  Result Date: 11/03/2017 CLINICAL DATA:  PICC line placement EXAM: PORTABLE CHEST 1 VIEW COMPARISON:  Chest x-ray of 11/03/2016 FINDINGS: Right-sided PICC line is now present with the tip seen to the lower SVC. No pneumothorax is seen. Moderate cardiomegaly is present and permanent pacemaker remains. Mild pulmonary vascular congestion cannot be excluded. There are degenerative changes noted in both shoulders. IMPRESSION: 1. Right-sided PICC line placed with the tip seen to the lower SVC. No pneumothorax. 2. Stable moderate cardiomegaly. Cannot exclude mild pulmonary vascular congestion. Electronically Signed   By: Ivar Drape M.D.   On: 11/03/2017 17:16   [place] Ekg Site Rite  Result Date: 11/03/2017 If Site Rite image not attached, placement could not be confirmed  due to current cardiac rhythm.  Impression:  1.  Multiple medical problems including chronic kidney disease. 2.  Chronic anticoagulation. 3.  Acute on chronic anemia. 4.  Melena, + nsaids, history peptic ulcer and gastric AVMs. 5.  Mild pancytopenia, chronic.  Plan:  1.  PPI. 2.  Clear liquids today, NPO after midnight. 3.  IV fluids judiciously, serial CBCs, transfuse as needed. 4.  Endoscopy tomorrow, pending anesthesia availability. 5.  Risks (bleeding, infection, bowel perforation that could require surgery, sedation-related changes in cardiopulmonary systems), benefits (identification and possible treatment of source of symptoms, exclusion of certain causes of symptoms), and alternatives (watchful waiting, radiographic imaging studies, empiric medical treatment) of upper endoscopy (EGD) were explained to  patient/family in detail and patient wishes to proceed. 6.  Next step in management pending endoscopy findings. 7.  [facility] will follow.   LOS: 1 day   Birdia Jaycox M  11/04/2017, 10:10 AM  Cell [phone] If no answer or after 5 PM call [phone]

## 2017-11-04 NOTE — Progress Notes (Signed)
Hospitalist progress note   [name]  QIH:[id number] DOB: 08/10/1931 DOA: 11/03/2017 PCP: [name], MD   Specialists:  Cardiology consulted in the emergency room Gastroenterology consulted by emergency room   Brief Narrative:  84 year old male, atrial fibrillation on Coumadin chads score >4 with pacemaker placement-[employer] 2013, nonischemic cardiomyopathy on cath 2010, MI in year 2000 BPH and chronic urinary retention, prior bleeding from varicose veins, transaminitis, bipolar, obesity, OSA on CPAP, sundowning and cognitive decline, gout, hyperlipidemia, ruptured sigmoid diverticulitis status post bowel resection 04/22/2006-Follows with [facility] in outpatient setting Dr. Guinevere Scarlet had upper endoscopy performed--showed erosive esophagitis   Recent admission 2/18-2/19 acute/chronic respiratory failure discharged home Recent admission 2/22-2/24 2019 Acute/chronic systolic HF EF 87-56% 4/33/29-[id number] noted and felt to be more 40-45%: Felt to need possible pulmonary referral-had been admitted for scrotal edema as well as overall swelling  Readmit 3/28 chest pain dyspnea on exertion hemoglobin 7 in a setting of taking NSAIDs and BC powder as a substitute for chronic pain--note recently ran out of Eliquis  Assessment & Plan:   Hypovolemic shock on admission-likely secondary to blood loss 7-given LR 100 cc/h kept n.p.o., started IV PPI gtt., 2 large-bore IV e-blood pressures have resolved to some degree to the low 016 systolic range with IV fluid repletion  Anemia from upper GI bleed-Hb 8.7--7--1 unit--8.0-follow trend and transfuse to keep above 8 2/2 MI in 2000 and cardiomyopathy A. Fib . holding Eliquis.  appreciate GI input in advancwe  Chest pain probably secondary to demand ischemia as per cardiology-prior exam by cardiology more epigastric pressure and pain.  Holding Aldactone 25, Lasix 80 twice daily for now Troponin peak 2.5 EKG repeat this a.m. shows RBB,  junctional rhythm-as per cardiology further workup--contrary to hypotension nitroglycerin has been held  Acute/chronic renal failure BUN/creatinine baseline 2/24 = 49/1.48--149/3.6-we will discuss with family and repeat labs-upper GI bleed may payroll and azotemia causing acute elevation-at this time hold renal consult  OSA on CPAP-if patient able to use will continue the same  BPH-would continue Flomax 0.4 as well as Avodart 0.5  Gout-was taking allopurinol 200 daily, colchicine 0.6 as needed which have been held-we will need refills of tramadol on discharge  Possible dementia-continue Lexapro 5 at bedtime, Remeron 15 at bedtime   DVT prophylaxis: SCD code Status:   Presumed full code   family Communication:   Attempted to call family member daughter [phone] but did not receive a response disposition Plan: Inpatient stepdown status   Consultants:   Cardiology  Gastroenterology  Procedures:   None yet  Antimicrobials:   None  Subjective: Doing fair at this time no chest pain no nausea Last dark stool was may be yesterday and prior to that he claims day before Short of breath now occasionally but does not seem to be in extremis No fever no chills   Objective: Vitals:   11/04/17 0404 11/04/17 0529 11/04/17 0600 11/04/17 0700  BP: (!) 124/53 (!) 112/52 (!) 98/57 (!) 99/56  Pulse: 60 (!) 58 (!) 58 63  Resp: 17 20 16  (!) 21  Temp: 98.7 F (37.1 C)  98.4 F (36.9 C)   TempSrc: Oral  Oral   SpO2: 94% 95% 95%     Intake/Output Summary (Last 24 hours) at 11/04/2017 0737 Last data filed at 11/04/2017 0404 Gross per 24 hour  Intake 1071 ml  Output -  Net 1071 ml   There were no vitals filed for this visit.  Examination:  Pleasant alert slightly disheveled  and tangential however redirect-on oxygen EOMI NCAT no JVD L9-F7 holosystolic murmur Chest is clinically clear without added sound Epigastric tenderness noted Mild lower extremity edema Neurologically grossly  intact and moves limbs equally    Data Reviewed: I have personally reviewed following labs and imaging studies  CBC: Recent Labs  Lab 11/03/17 1153 11/04/17 0458  WBC 2.7* 5.4  HGB 7.0* 8.0*  HCT 20.8* 24.9*  MCV 110.1* 106.0*  PLT 109* 902*   Basic Metabolic Panel: Recent Labs  Lab 11/03/17 1153 11/03/17 1344  NA 134*  --   K 4.7  --   CL 94*  --   CO2 27  --   GLUCOSE 108*  --   BUN 149*  --   CREATININE 3.68*  --   CALCIUM 9.0  --   MG  --  2.2   GFR: CrCl cannot be calculated (Unknown ideal weight.). Liver Function Tests: No results for input(s): AST, ALT, ALKPHOS, BILITOT, PROT, ALBUMIN in the last 168 hours. No results for input(s): LIPASE, AMYLASE in the last 168 hours. No results for input(s): AMMONIA in the last 168 hours. Coagulation Profile: No results for input(s): INR, PROTIME in the last 168 hours. Cardiac Enzymes:  Radiology Studies: Reviewed images personally in health database   Scheduled Meds: . allopurinol  200 mg Oral Daily  . dutasteride  0.5 mg Oral Daily  . escitalopram  5 mg Oral QHS  . mirtazapine  15 mg Oral QHS  . rOPINIRole  3 mg Oral QHS  . sodium chloride flush  10-40 mL Intracatheter Q12H   Continuous Infusions: . lactated ringers 100 mL/hr at 11/04/17 0532  . pantoprozole (PROTONIX) infusion 8 mg/hr (11/04/17 0532)     LOS: 1 day    Time :35 minutes  Verneita Griffes, MD [facility]  If 7PM-7AM, please contact night-coverage [email or web address] Password [facility] 11/04/2017, 7:37 AM

## 2017-11-04 NOTE — Progress Notes (Signed)
[name] is a 84 y.o. male patient admitted from ED awake, alert - oriented  X 4 - no acute distress noted.  VSS - Blood pressure (!) 104/55, pulse 61, temperature 98.4 F (36.9 C), temperature source Oral, resp. rate 13, SpO2 95 %.    IV in place, occlusive dsg intact without redness.  Orientation to room, and floor completed with information packet given to patient/family.  Patient declined safety video at this time.  Admission INP armband ID verified with patient/family, and in place.   SR up x 2, fall assessment complete, with patient and family able to verbalize understanding of risk associated with falls, and verbalized understanding to call nsg before up out of bed.  Call light within reach, patient able to voice, and demonstrate understanding.  Skin, clean-dry- intact with evidence of generalized  Bruising due to Xarelto. No evidence of skin break down noted on exam.     Will cont to eval and treat per MD orders.  Dorris Carnes, RN 11/04/2017 7:00 PM

## 2017-11-04 NOTE — Consult Note (Signed)
[facility] Consultation Note  Referring Provider: Dr. Verneita Griffes Primary Care Physician:  Osborne Casco [name] Him, MD Primary Gastroenterologist:  Dr. Watt Climes  Reason for Consultation:  Anemia, hemoccult positive stool  HPI: [name] is a 84 y.o. male pacemaker, afib on apixaban (last dose 3 days ago), CHF, presenting to hospital with pre-syncope and weakness.   Iron-->dark stools chronically, but stools more dark and tarry over the past few weeks.  Some loose stool few days ago.  Chronic pain; on tramadol; ran out of this, and has been taking naproxen and BC powders.  EGD 2018 showed gastritis and duodenal ulcer; EGD 2015/2016 has shown some gastric avms.  CKD, baseline Hgb ~9, but was 7 on admission.  Is taking PPI.   Past Medical History:  Diagnosis Date  . Arthritis    "feet" (09/30/2017)  . CHF (congestive heart failure) ([place])   . Chronic bronchitis ([place])   . CKD (chronic kidney disease), stage III ([place])    [name] 09/30/2017  . Coronary artery disease   . Diverticulitis   . GERD (gastroesophageal reflux disease)   . Gout   . History of blood transfusion    "blood loss" (09/30/2017)  . Hyperlipidemia   . Hypertension   . MI (myocardial infarction) ([place]) ~ 2000   "light one"  . Nonischemic cardiomyopathy (HCC)    mild  . OSA on CPAP   . Permanent atrial fibrillation (HCC)    on Eliquis  . Presence of permanent cardiac pacemaker   . Pulmonary hypertension ([place])   . Restless legs     Past Surgical History:  Procedure Laterality Date  . APPENDECTOMY  12/2000   [name] 12/22/2010  . CARDIAC CATHETERIZATION  11/07/2008   nonischemic cardiomyopathy,pulmonary hypertension  . CATARACT EXTRACTION W/ INTRAOCULAR LENS  IMPLANT, BILATERAL Bilateral   . CIRCUMCISION  12/2005   [name] 12/22/2010  . COLOSTOMY  12/2000   [name] 12/22/2010  . COLOSTOMY REVERSAL  07/2001   [name] 12/22/2010  . CORONARY ANGIOPLASTY  06/01/1999   successful to ostium of the first diagonal  .  ESOPHAGOGASTRODUODENOSCOPY N/A 08/22/2013   Procedure: ESOPHAGOGASTRODUODENOSCOPY (EGD);  Surgeon: [name], MD;  Location: [facility] ENDOSCOPY;  Service: Endoscopy;  Laterality: N/A;  h/p in file cabinet, jackie  . ESOPHAGOGASTRODUODENOSCOPY N/A 11/05/2014   Procedure: ESOPHAGOGASTRODUODENOSCOPY (EGD);  Surgeon: Clarene Essex, MD;  Location: [facility] ENDOSCOPY;  Service: Endoscopy;  Laterality: N/A;  . ESOPHAGOGASTRODUODENOSCOPY N/A 11/08/2016   Procedure: ESOPHAGOGASTRODUODENOSCOPY (EGD);  Surgeon: [name], MD;  Location: [facility] ENDOSCOPY;  Service: Endoscopy;  Laterality: N/A;  . HOT HEMOSTASIS N/A 11/05/2014   Procedure: HOT HEMOSTASIS (ARGON PLASMA COAGULATION/BICAP);  Surgeon: Clarene Essex, MD;  Location: [facility] ENDOSCOPY;  Service: Endoscopy;  Laterality: N/A;  . INSERT / REPLACE / REMOVE PACEMAKER    . JOINT REPLACEMENT    . MASS EXCISION Left    hand w/ulnar artery reconstruction/notes 12/22/2010  . NM MYOCAR PERF WALL MOTION  11/24/2007   normal  . PERMANENT PACEMAKER INSERTION  10/04/2012   [employer]  . PERMANENT PACEMAKER INSERTION N/A 10/12/2011   Procedure: PERMANENT PACEMAKER INSERTION;  Surgeon: [name], MD;  Location: [facility] CATH LAB;  Service: Cardiovascular;  Laterality: N/A;  . REPLACEMENT TOTAL KNEE Bilateral 11/2006   right-left/notes 12/22/2010  . ROTATOR CUFF REPAIR Right 09/2004   [name] 12/22/2010  . SAVORY DILATION N/A 08/22/2013   Procedure: SAVORY DILATION;  Surgeon: [name], MD;  Location: [facility] ENDOSCOPY;  Service: Endoscopy;  Laterality: N/A;  .  SHOULDER SURGERY Right    "fell off house; messed up 3 things in my arm"  . TONSILLECTOMY    . US ECHOCARDIOGRAPHY  02/01/2011   LA is mod-severely dilated,AOV & root sclerotic,ca+ AOV leaflets    Prior to Admission medications   Medication Sig Start Date End Date Taking? Authorizing Provider  acetaminophen (TYLENOL) 650 MG CR tablet Take 650 mg by mouth every 8 (eight) hours as needed for pain.   Yes [provider]   allopurinol (ZYLOPRIM) 100 MG tablet Take 2 tablets (200 mg total) by mouth daily. 11/09/16  Yes Theodis Blaze, MD  Amino Acids-Protein Hydrolys (FEEDING SUPPLEMENT, PRO-STAT SUGAR FREE 64,) LIQD Take 30 mLs by mouth 2 (two) times daily. 10/02/17  Yes Jani Gravel, MD  apixaban (ELIQUIS) 5 MG TABS tablet Take 5 mg by mouth 2 (two) times daily.   Yes [provider]  colchicine 0.6 MG tablet Take 0.6 mg by mouth daily as needed (gout flare). Colcrys   Yes [provider]  Docusate Calcium (STOOL SOFTENER PO) Take 2 tablets by mouth at bedtime.    Yes [provider]  dutasteride (AVODART) 0.5 MG capsule Take 0.5 mg by mouth daily.   Yes [provider]  escitalopram (LEXAPRO) 5 MG tablet Take 1 tablet (5 mg total) by mouth at bedtime. 11/09/16  Yes Theodis Blaze, MD  ferrous sulfate 325 (65 FE) MG tablet Take 325 mg by mouth daily with breakfast.   Yes [provider]  furosemide (LASIX) 40 MG tablet Take 80 mg by mouth 2 (two) times daily.   Yes [provider]  hydrOXYzine (ATARAX/VISTARIL) 25 MG tablet TAKE 1 TABLET BY MOUTH EVERY 6 HOURS AS NEEDED FOR ITCHING Patient taking differently: TAKE 1 TABLET (25 MG) BY MOUTH EVERY 6 HOURS AS NEEDED FOR ITCHING 07/07/17  Yes Croitoru, Mihai, MD  loratadine (CLARITIN) 10 MG tablet Take 10 mg by mouth daily.   Yes [provider]  mirtazapine (REMERON) 15 MG tablet Take 15 mg by mouth at bedtime.   Yes [provider]  Multiple Vitamin (MULTIVITAMIN WITH MINERALS) TABS tablet Take 1 tablet by mouth daily.   Yes [provider]  Multiple Vitamins-Minerals (PRESERVISION AREDS 2) CAPS Take 1 capsule by mouth daily.   Yes [provider]  pantoprazole (PROTONIX) 40 MG tablet Take 1 tablet (40 mg total) by mouth daily. 11/10/16  Yes Theodis Blaze, MD  potassium chloride (K-DUR) 10 MEQ tablet Take 1 tablet (10 mEq total) by mouth daily. Patient taking differently: Take 10 mEq by  mouth 2 (two) times daily.  03/17/17  Yes Croitoru, Mihai, MD  rOPINIRole (REQUIP) 3 MG tablet Take 3 mg by mouth at bedtime.  08/02/15  Yes [provider]  Tamsulosin HCl (FLOMAX) 0.4 MG CAPS Take 0.4 mg by mouth daily after breakfast.   Yes [provider]  traMADol (ULTRAM) 50 MG tablet Take 100 mg by mouth 2 (two) times daily. For pain    Yes [provider]  furosemide (LASIX) 40 MG tablet Take 2 tablets (80 mg total) by mouth See admin instructions. Take 2 tablets (80 mg) by mouth twice daily, Take extra tablet (40 mg) midday x 4 days only (starting 09/27/17) 10/02/17 11/01/17  Jani Gravel, MD  nitroGLYCERIN (NITROSTAT) 0.4 MG SL tablet Place 0.4 mg under the tongue every 5 (five) minutes as needed for chest pain.     [provider]  spironolactone (ALDACTONE) 25 MG tablet Take 1  tablet (25 mg total) by mouth daily. Patient not taking: Reported on 11/03/2017 10/03/17   Jani Gravel, MD    Current Facility-Administered Medications  Medication Dose Route Frequency Provider Last Rate Last Dose  . acetaminophen (TYLENOL) tablet 650 mg  650 mg Oral Q4H PRN Karmen Bongo, MD      . allopurinol (ZYLOPRIM) tablet 200 mg  200 mg Oral Daily Karmen Bongo, MD      . dutasteride (AVODART) capsule 0.5 mg  0.5 mg Oral Daily Karmen Bongo, MD      . escitalopram (LEXAPRO) tablet 5 mg  5 mg Oral Ivery Quale, MD      . lactated ringers infusion   Intravenous Continuous Karmen Bongo, MD 100 mL/hr at 11/04/17 0532    . mirtazapine (REMERON) tablet 15 mg  15 mg Oral QHS Karmen Bongo, MD      . morphine 4 MG/ML injection 2 mg  2 mg Intravenous Q2H PRN Karmen Bongo, MD      . ondansetron [facility]) injection 4 mg  4 mg Intravenous Q6H PRN Karmen Bongo, MD      . pantoprazole (PROTONIX) 80 mg in sodium chloride 0.9 % 250 mL (0.32 mg/mL) infusion  8 mg/hr Intravenous Continuous Karmen Bongo, MD 25 mL/hr at 11/04/17 0532 8 mg/hr at 11/04/17 0532  . rOPINIRole  (REQUIP) tablet 3 mg  3 mg Oral QHS Karmen Bongo, MD      . sodium chloride flush (NS) 0.9 % injection 10-40 mL  10-40 mL Intracatheter Q12H Karmen Bongo, MD      . sodium chloride flush (NS) 0.9 % injection 10-40 mL  10-40 mL Intracatheter PRN Karmen Bongo, MD      . tamsulosin (FLOMAX) capsule 0.4 mg  0.4 mg Oral QPC breakfast Nita Sells, MD       Current Outpatient Medications  Medication Sig Dispense Refill  . acetaminophen (TYLENOL) 650 MG CR tablet Take 650 mg by mouth every 8 (eight) hours as needed for pain.    [name] allopurinol (ZYLOPRIM) 100 MG tablet Take 2 tablets (200 mg total) by mouth daily. 30 tablet 1  . Amino Acids-Protein Hydrolys (FEEDING SUPPLEMENT, PRO-STAT SUGAR FREE 64,) LIQD Take 30 mLs by mouth 2 (two) times daily. 900 mL 0  . apixaban (ELIQUIS) 5 MG TABS tablet Take 5 mg by mouth 2 (two) times daily.    . colchicine 0.6 MG tablet Take 0.6 mg by mouth daily as needed (gout flare). Colcrys    . Docusate Calcium (STOOL SOFTENER PO) Take 2 tablets by mouth at bedtime.     . dutasteride (AVODART) 0.5 MG capsule Take 0.5 mg by mouth daily.    [name] escitalopram (LEXAPRO) 5 MG tablet Take 1 tablet (5 mg total) by mouth at bedtime. 30 tablet 0  . ferrous sulfate 325 (65 FE) MG tablet Take 325 mg by mouth daily with breakfast.    . furosemide (LASIX) 40 MG tablet Take 80 mg by mouth 2 (two) times daily.    . hydrOXYzine (ATARAX/VISTARIL) 25 MG tablet TAKE 1 TABLET BY MOUTH EVERY 6 HOURS AS NEEDED FOR ITCHING (Patient taking differently: TAKE 1 TABLET (25 MG) BY MOUTH EVERY 6 HOURS AS NEEDED FOR ITCHING) 90 tablet 2  . loratadine (CLARITIN) 10 MG tablet Take 10 mg by mouth daily.    . mirtazapine (REMERON) 15 MG tablet Take 15 mg by mouth at bedtime.    . Multiple Vitamin (MULTIVITAMIN WITH MINERALS) TABS tablet Take 1 tablet by mouth daily.    [name]  Multiple Vitamins-Minerals (PRESERVISION AREDS 2) CAPS Take 1 capsule by mouth daily.    . pantoprazole (PROTONIX) 40 MG  tablet Take 1 tablet (40 mg total) by mouth daily. 30 tablet 1  . potassium chloride (K-DUR) 10 MEQ tablet Take 1 tablet (10 mEq total) by mouth daily. (Patient taking differently: Take 10 mEq by mouth 2 (two) times daily. ) 90 tablet 3  . rOPINIRole (REQUIP) 3 MG tablet Take 3 mg by mouth at bedtime.     . Tamsulosin HCl (FLOMAX) 0.4 MG CAPS Take 0.4 mg by mouth daily after breakfast.    . traMADol (ULTRAM) 50 MG tablet Take 100 mg by mouth 2 (two) times daily. For pain     . furosemide (LASIX) 40 MG tablet Take 2 tablets (80 mg total) by mouth See admin instructions. Take 2 tablets (80 mg) by mouth twice daily, Take extra tablet (40 mg) midday x 4 days only (starting 09/27/17) 60 tablet 0  . nitroGLYCERIN (NITROSTAT) 0.4 MG SL tablet Place 0.4 mg under the tongue every 5 (five) minutes as needed for chest pain.     [name] spironolactone (ALDACTONE) 25 MG tablet Take 1 tablet (25 mg total) by mouth daily. (Patient not taking: Reported on 11/03/2017) 30 tablet 0    Allergies as of 11/03/2017 - Review Complete 11/03/2017  Allergen Reaction Noted  . Vicodin [hydrocodone-acetaminophen] Itching 08/22/2013  . Hydrocodone Itching 10/08/2011    Family History  Problem Relation Age of Onset  . Cancer Mother   . Heart attack Father   . Diabetes Brother     Social History   Socioeconomic History  . Marital status: Married    Spouse name: Not on file  . Number of children: Not on file  . Years of education: Not on file  . Highest education level: Not on file  Occupational History  . Occupation: retired  Scientific laboratory technician  . Financial resource strain: Not on file  . Food insecurity:    Worry: Not on file    Inability: Not on file  . Transportation needs:    Medical: Not on file    Non-medical: Not on file  Tobacco Use  . Smoking status: Former Research scientist (life sciences)  . Smokeless tobacco: Never Used  . Tobacco comment: 09/30/2017 "it's been over 36yr since I smoked anything"  Substance and Sexual Activity  .  Alcohol use: No  . Drug use: No  . Sexual activity: Never  Lifestyle  . Physical activity:    Days per week: Not on file    Minutes per session: Not on file  . Stress: Not on file  Relationships  . Social connections:    Talks on phone: Not on file    Gets together: Not on file    Attends religious service: Not on file    Active member of club or organization: Not on file    Attends meetings of clubs or organizations: Not on file    Relationship status: Not on file  . Intimate partner violence:    Fear of current or ex partner: Not on file    Emotionally abused: Not on file    Physically abused: Not on file    Forced sexual activity: Not on file  Other Topics Concern  . Not on file  Social History Narrative  . Not on file    Review of Systems: As per HPI, all others negative.  Physical Exam: Vital signs in last 24 hours: Temp:  [97.7 F (36.5 C)-98.7 F (  37.1 C)] 98.4 F (36.9 C) (03/29 0600) Pulse Rate:  [37-64] 60 (03/29 0900) Resp:  [12-23] 16 (03/29 0900) BP: (89-141)/(49-78) 110/54 (03/29 0900) SpO2:  [77 %-100 %] 92 % (03/29 0900)   General:   Alert,  Well-developed, well-nourished, pleasant and cooperative in NAD Head:  Normocephalic and atraumatic. Eyes:  Sclera clear, no icterus.   Conjunctiva pale Ears:  Normal auditory acuity. Nose:  No deformity, discharge,  or lesions. Mouth:  No deformity or lesions.  Oropharynx pale and dry Neck:  Supple; no masses or thyromegaly. Lungs:  Clear throughout to auscultation.   No wheezes, crackles, or rhonchi. No acute distress. Heart:  Regular rate and rhythm; IV/VI SEM LUSB, no other clicks, rubs,  or gallops. Abdomen:  Soft, nontender and nondistended. No masses, hepatosplenomegaly or hernias noted. Normal bowel sounds, without guarding, and without rebound.     Msk:  Symmetrical without gross deformities. Normal posture. Pulses:  Normal pulses noted. Extremities:  Without clubbing or edema. Neurologic:  Alert and   oriented x4; diffusely weak, otherwise grossly normal neurologically. Skin:  Scattered ecchymoses, otherwise intact without significant lesions or rashes. Cervical Nodes:  No significant cervical adenopathy. Psych:  Alert and cooperative. Normal mood and affect.   Lab Results: Recent Labs    11/03/17 1153 11/04/17 0458  WBC 2.7* 5.4  HGB 7.0* 8.0*  HCT 20.8* 24.9*  PLT 109* 108*   BMET Recent Labs    11/03/17 1153  NA 134*  K 4.7  CL 94*  CO2 27  GLUCOSE 108*  BUN 149*  CREATININE 3.68*  CALCIUM 9.0   LFT No results for input(s): PROT, ALBUMIN, AST, ALT, ALKPHOS, BILITOT, BILIDIR, IBILI in the last 72 hours. PT/INR No results for input(s): LABPROT, INR in the last 72 hours.  Studies/Results: Dg Chest 2 View  Result Date: 11/03/2017 CLINICAL DATA:  84 year old male with history of substernal chest pain and shortness of breath yesterday and today. EXAM: CHEST - 2 VIEW COMPARISON:  Chest x-ray 09/30/2017. FINDINGS: There is cephalization of the pulmonary vasculature and slight indistinctness of the interstitial markings suggestive of mild pulmonary edema. No pleural effusions. Mild cardiomegaly. Upper mediastinal contours are within normal limits. Aortic atherosclerosis. Left sided pacemaker with single lead projecting over the expected location of the right ventricular apex. IMPRESSION: 1. The appearance the chest is most compatible with mild congestive heart failure, as above. 2. Aortic atherosclerosis. Electronically Signed   By: Vinnie Langton M.D.   On: 11/03/2017 12:21   Ct Head Wo Contrast  Result Date: 11/03/2017 CLINICAL DATA:  Multiple falls at home, posttraumatic headache, weakness, bruising on face in arms, history of coronary artery disease post MI, non ischemic cardiomyopathy, CHF, stage III chronic kidney disease, hypertension, permanent atrial fibrillation, pulmonary hypertension EXAM: CT HEAD WITHOUT CONTRAST CT CERVICAL SPINE WITHOUT CONTRAST TECHNIQUE:  Multidetector CT imaging of the head and cervical spine was performed following the standard protocol without intravenous contrast. Multiplanar CT image reconstructions of the cervical spine were also generated. COMPARISON:  04/05/2017 FINDINGS: CT HEAD FINDINGS Brain: Motion artifacts near vertex. Generalized atrophy. Normal ventricular morphology. No midline shift or mass effect. Small vessel chronic ischemic changes of deep cerebral white matter. No intracranial hemorrhage, mass lesion or evidence acute infarction identified within limitations of motion. Small old lacunar infarct at posterior limb of LEFT internal capsule. No extra-axial fluid collections. Vascular: Atherosclerotic calcification of internal carotid arteries at skull base Skull: Grossly intact Sinuses/Orbits: Clear Other: N/A CT CERVICAL SPINE FINDINGS Alignment: Imaging  degraded by patient motion. Alignment grossly normal. Skull base and vertebrae: Visualized skull base intact. Bones demineralized. Vertebral body heights appear maintained. No definite fracture, subluxation, or bone destruction. Multilevel facet degenerative changes. Soft tissues and spinal canal: Prevertebral soft tissues normal thickness. Disc levels: Mild AP narrowing of spinal canal due to endplate spur formation at C5-C6. Disc space narrowing at C4-C5 through C6-C7. Upper chest: Lung apices clear Other: Atherosclerotic calcifications within the carotid systems. IMPRESSION: Motion artifacts on both CT head and CT cervical spine exams. Atrophy with small vessel chronic ischemic changes of deep cerebral white matter. Tiny old lacunar infarct at posterior limb LEFT internal capsule. No definite acute intracranial abnormalities. Multilevel degenerative disc and facet disease changes of the cervical spine. No definite acute cervical spine abnormalities. Electronically Signed   By: Lavonia Dana M.D.   On: 11/03/2017 18:01   Ct Cervical Spine Wo Contrast  Result Date:  11/03/2017 CLINICAL DATA:  Multiple falls at home, posttraumatic headache, weakness, bruising on face in arms, history of coronary artery disease post MI, non ischemic cardiomyopathy, CHF, stage III chronic kidney disease, hypertension, permanent atrial fibrillation, pulmonary hypertension EXAM: CT HEAD WITHOUT CONTRAST CT CERVICAL SPINE WITHOUT CONTRAST TECHNIQUE: Multidetector CT imaging of the head and cervical spine was performed following the standard protocol without intravenous contrast. Multiplanar CT image reconstructions of the cervical spine were also generated. COMPARISON:  04/05/2017 FINDINGS: CT HEAD FINDINGS Brain: Motion artifacts near vertex. Generalized atrophy. Normal ventricular morphology. No midline shift or mass effect. Small vessel chronic ischemic changes of deep cerebral white matter. No intracranial hemorrhage, mass lesion or evidence acute infarction identified within limitations of motion. Small old lacunar infarct at posterior limb of LEFT internal capsule. No extra-axial fluid collections. Vascular: Atherosclerotic calcification of internal carotid arteries at skull base Skull: Grossly intact Sinuses/Orbits: Clear Other: N/A CT CERVICAL SPINE FINDINGS Alignment: Imaging degraded by patient motion. Alignment grossly normal. Skull base and vertebrae: Visualized skull base intact. Bones demineralized. Vertebral body heights appear maintained. No definite fracture, subluxation, or bone destruction. Multilevel facet degenerative changes. Soft tissues and spinal canal: Prevertebral soft tissues normal thickness. Disc levels: Mild AP narrowing of spinal canal due to endplate spur formation at C5-C6. Disc space narrowing at C4-C5 through C6-C7. Upper chest: Lung apices clear Other: Atherosclerotic calcifications within the carotid systems. IMPRESSION: Motion artifacts on both CT head and CT cervical spine exams. Atrophy with small vessel chronic ischemic changes of deep cerebral white matter.  Tiny old lacunar infarct at posterior limb LEFT internal capsule. No definite acute intracranial abnormalities. Multilevel degenerative disc and facet disease changes of the cervical spine. No definite acute cervical spine abnormalities. Electronically Signed   By: Lavonia Dana M.D.   On: 11/03/2017 18:01   Dg Chest Port 1 View  Result Date: 11/03/2017 CLINICAL DATA:  PICC line placement EXAM: PORTABLE CHEST 1 VIEW COMPARISON:  Chest x-ray of 11/03/2016 FINDINGS: Right-sided PICC line is now present with the tip seen to the lower SVC. No pneumothorax is seen. Moderate cardiomegaly is present and permanent pacemaker remains. Mild pulmonary vascular congestion cannot be excluded. There are degenerative changes noted in both shoulders. IMPRESSION: 1. Right-sided PICC line placed with the tip seen to the lower SVC. No pneumothorax. 2. Stable moderate cardiomegaly. Cannot exclude mild pulmonary vascular congestion. Electronically Signed   By: Ivar Drape M.D.   On: 11/03/2017 17:16   [place] Ekg Site Rite  Result Date: 11/03/2017 If Site Rite image not attached, placement could not be confirmed  due to current cardiac rhythm.  Impression:  1.  Multiple medical problems including chronic kidney disease. 2.  Chronic anticoagulation. 3.  Acute on chronic anemia. 4.  Melena, + nsaids, history peptic ulcer and gastric AVMs. 5.  Mild pancytopenia, chronic.  Plan:  1.  PPI. 2.  Clear liquids today, NPO after midnight. 3.  IV fluids judiciously, serial CBCs, transfuse as needed. 4.  Endoscopy tomorrow, pending anesthesia availability. 5.  Risks (bleeding, infection, bowel perforation that could require surgery, sedation-related changes in cardiopulmonary systems), benefits (identification and possible treatment of source of symptoms, exclusion of certain causes of symptoms), and alternatives (watchful waiting, radiographic imaging studies, empiric medical treatment) of upper endoscopy (EGD) were explained to  patient/family in detail and patient wishes to proceed. 6.  Next step in management pending endoscopy findings. 7.  [facility] will follow.   LOS: 1 day   [place] M  11/04/2017, 10:10 AM  Cell [phone] If no answer or after 5 PM call [phone]

## 2017-11-04 NOTE — Progress Notes (Signed)
Report received from Ed. Pt arrived to the unit at 1850 via bed.

## 2017-11-04 NOTE — ED Notes (Signed)
Placed on hospital bed at this time, pt alert x4

## 2017-11-04 NOTE — Progress Notes (Addendum)
Progress Note  Patient Name: [name] Date of Encounter: 11/04/2017  Primary Cardiologist: [name], MD  Subjective   Wants ice chips, mouth very dry, denies CP or SOB  Inpatient Medications    Scheduled Meds: . allopurinol  200 mg Oral Daily  . dutasteride  0.5 mg Oral Daily  . escitalopram  5 mg Oral QHS  . mirtazapine  15 mg Oral QHS  . rOPINIRole  3 mg Oral QHS  . sodium chloride flush  10-40 mL Intracatheter Q12H  . tamsulosin  0.4 mg Oral QPC breakfast   Continuous Infusions: . lactated ringers 100 mL/hr at 11/04/17 0532  . pantoprozole (PROTONIX) infusion 8 mg/hr (11/04/17 0532)   PRN Meds: acetaminophen, morphine injection, ondansetron (ZOFRAN) IV, sodium chloride flush   Vital Signs    Vitals:   11/04/17 0404 11/04/17 0529 11/04/17 0600 11/04/17 0700  BP: (!) 124/53 (!) 112/52 (!) 98/57 (!) 99/56  Pulse: 60 (!) 58 (!) 58 63  Resp: 17 20 16  (!) 21  Temp: 98.7 F (37.1 C)  98.4 F (36.9 C)   TempSrc: Oral  Oral   SpO2: 94% 95% 95%     Intake/Output Summary (Last 24 hours) at 11/04/2017 9357 Last data filed at 11/04/2017 0404 Gross per 24 hour  Intake 1071 ml  Output -  Net 1071 ml   There were no vitals filed for this visit.  Telemetry    Atrial fib, V pacing most of the time, PVCs and short runs NSVT- Personally Reviewed  ECG    Afib, V pacing - Personally Reviewed  Physical Exam   General: Well developed, well nourished, male appearing in no acute distress. Head: Normocephalic, atraumatic.  Neck: Supple without bruits, JVD 11 cm. Lungs:  Resp regular and unlabored, slightly decreased BS bases. Heart: RRR, S1, S2, no S3, S4, 2-3/6 murmur; no rub. Abdomen: Soft, non-tender, non-distended with normoactive bowel sounds. No hepatomegaly. No rebound/guarding. No obvious abdominal masses. Extremities: No clubbing, cyanosis, no edema. Distal pedal pulses are 2+ bilaterally. Neuro: Alert and oriented X 3. Moves all extremities  spontaneously. Psych: Normal affect.  Labs    Hematology Recent Labs  Lab 11/03/17 1153 11/04/17 0458  WBC 2.7* 5.4  RBC 1.89* 2.35*  HGB 7.0* 8.0*  HCT 20.8* 24.9*  MCV 110.1* 106.0*  MCH 37.0* 34.0  MCHC 33.7 32.1  RDW 18.0* 20.6*  PLT 109* 108*    Chemistry Recent Labs  Lab 11/03/17 1153  NA 134*  K 4.7  CL 94*  CO2 27  GLUCOSE 108*  BUN 149*  CREATININE 3.68*  CALCIUM 9.0  GFRNONAA 14*  GFRAA 16*  ANIONGAP 13     Cardiac Enzymes Recent Labs  Lab 11/03/17 2357 11/04/17 0458  TROPONINI 2.64* 2.56*    Recent Labs  Lab 11/03/17 1158  TROPIPOC 0.47*     BNP Recent Labs  Lab 11/03/17 1344  BNP 1,728.6*     Radiology    Dg Chest 2 View  Result Date: 11/03/2017 CLINICAL DATA:  84 year old male with history of substernal chest pain and shortness of breath yesterday and today. EXAM: CHEST - 2 VIEW COMPARISON:  Chest x-ray 09/30/2017. FINDINGS: There is cephalization of the pulmonary vasculature and slight indistinctness of the interstitial markings suggestive of mild pulmonary edema. No pleural effusions. Mild cardiomegaly. Upper mediastinal contours are within normal limits. Aortic atherosclerosis. Left sided pacemaker with single lead projecting over the expected location of the right ventricular apex. IMPRESSION: 1. The appearance the chest  is most compatible with mild congestive heart failure, as above. 2. Aortic atherosclerosis. Electronically Signed   By: Vinnie Langton M.D.   On: 11/03/2017 12:21   Ct Head Wo Contrast  Result Date: 11/03/2017 CLINICAL DATA:  Multiple falls at home, posttraumatic headache, weakness, bruising on face in arms, history of coronary artery disease post MI, non ischemic cardiomyopathy, CHF, stage III chronic kidney disease, hypertension, permanent atrial fibrillation, pulmonary hypertension EXAM: CT HEAD WITHOUT CONTRAST CT CERVICAL SPINE WITHOUT CONTRAST TECHNIQUE: Multidetector CT imaging of the head and cervical spine  was performed following the standard protocol without intravenous contrast. Multiplanar CT image reconstructions of the cervical spine were also generated. COMPARISON:  04/05/2017 FINDINGS: CT HEAD FINDINGS Brain: Motion artifacts near vertex. Generalized atrophy. Normal ventricular morphology. No midline shift or mass effect. Small vessel chronic ischemic changes of deep cerebral white matter. No intracranial hemorrhage, mass lesion or evidence acute infarction identified within limitations of motion. Small old lacunar infarct at posterior limb of LEFT internal capsule. No extra-axial fluid collections. Vascular: Atherosclerotic calcification of internal carotid arteries at skull base Skull: Grossly intact Sinuses/Orbits: Clear Other: N/A CT CERVICAL SPINE FINDINGS Alignment: Imaging degraded by patient motion. Alignment grossly normal. Skull base and vertebrae: Visualized skull base intact. Bones demineralized. Vertebral body heights appear maintained. No definite fracture, subluxation, or bone destruction. Multilevel facet degenerative changes. Soft tissues and spinal canal: Prevertebral soft tissues normal thickness. Disc levels: Mild AP narrowing of spinal canal due to endplate spur formation at C5-C6. Disc space narrowing at C4-C5 through C6-C7. Upper chest: Lung apices clear Other: Atherosclerotic calcifications within the carotid systems. IMPRESSION: Motion artifacts on both CT head and CT cervical spine exams. Atrophy with small vessel chronic ischemic changes of deep cerebral white matter. Tiny old lacunar infarct at posterior limb LEFT internal capsule. No definite acute intracranial abnormalities. Multilevel degenerative disc and facet disease changes of the cervical spine. No definite acute cervical spine abnormalities. Electronically Signed   By: Lavonia Dana M.D.   On: 11/03/2017 18:01   Ct Cervical Spine Wo Contrast  Result Date: 11/03/2017 CLINICAL DATA:  Multiple falls at home, posttraumatic  headache, weakness, bruising on face in arms, history of coronary artery disease post MI, non ischemic cardiomyopathy, CHF, stage III chronic kidney disease, hypertension, permanent atrial fibrillation, pulmonary hypertension EXAM: CT HEAD WITHOUT CONTRAST CT CERVICAL SPINE WITHOUT CONTRAST TECHNIQUE: Multidetector CT imaging of the head and cervical spine was performed following the standard protocol without intravenous contrast. Multiplanar CT image reconstructions of the cervical spine were also generated. COMPARISON:  04/05/2017 FINDINGS: CT HEAD FINDINGS Brain: Motion artifacts near vertex. Generalized atrophy. Normal ventricular morphology. No midline shift or mass effect. Small vessel chronic ischemic changes of deep cerebral white matter. No intracranial hemorrhage, mass lesion or evidence acute infarction identified within limitations of motion. Small old lacunar infarct at posterior limb of LEFT internal capsule. No extra-axial fluid collections. Vascular: Atherosclerotic calcification of internal carotid arteries at skull base Skull: Grossly intact Sinuses/Orbits: Clear Other: N/A CT CERVICAL SPINE FINDINGS Alignment: Imaging degraded by patient motion. Alignment grossly normal. Skull base and vertebrae: Visualized skull base intact. Bones demineralized. Vertebral body heights appear maintained. No definite fracture, subluxation, or bone destruction. Multilevel facet degenerative changes. Soft tissues and spinal canal: Prevertebral soft tissues normal thickness. Disc levels: Mild AP narrowing of spinal canal due to endplate spur formation at C5-C6. Disc space narrowing at C4-C5 through C6-C7. Upper chest: Lung apices clear Other: Atherosclerotic calcifications within the carotid systems. IMPRESSION: Motion  artifacts on both CT head and CT cervical spine exams. Atrophy with small vessel chronic ischemic changes of deep cerebral white matter. Tiny old lacunar infarct at posterior limb LEFT internal capsule.  No definite acute intracranial abnormalities. Multilevel degenerative disc and facet disease changes of the cervical spine. No definite acute cervical spine abnormalities. Electronically Signed   By: Lavonia Dana M.D.   On: 11/03/2017 18:01   Dg Chest Port 1 View  Result Date: 11/03/2017 CLINICAL DATA:  PICC line placement EXAM: PORTABLE CHEST 1 VIEW COMPARISON:  Chest x-ray of 11/03/2016 FINDINGS: Right-sided PICC line is now present with the tip seen to the lower SVC. No pneumothorax is seen. Moderate cardiomegaly is present and permanent pacemaker remains. Mild pulmonary vascular congestion cannot be excluded. There are degenerative changes noted in both shoulders. IMPRESSION: 1. Right-sided PICC line placed with the tip seen to the lower SVC. No pneumothorax. 2. Stable moderate cardiomegaly. Cannot exclude mild pulmonary vascular congestion. Electronically Signed   By: Ivar Drape M.D.   On: 11/03/2017 17:16   [place] Ekg Site Rite  Result Date: 11/03/2017 If Site Rite image not attached, placement could not be confirmed due to current cardiac rhythm.    Cardiac Studies   ECHO:  09/21/2017 - Left ventricle: The cavity size was moderately dilated. There was   mild concentric hypertrophy. Systolic function was severely   reduced. The estimated ejection fraction was in the range of 25%   to 30%. The study is not technically sufficient to allow   evaluation of LV diastolic function. (Dr C feels EF 40-45%, close to previous baseline) - Mitral valve: There was mild regurgitation. - Left atrium: The atrium was moderately dilated. - Right ventricle: The cavity size was moderately dilated. - Right atrium: The atrium was moderately dilated. - Atrial septum: No defect or patent foramen ovale was identified. - Tricuspid valve: There was severe regurgitation. - Pulmonary arteries: PA peak pressure: 111 mm Hg (S).   Patient Profile     84 y.o. male w/ hx permanent atrial fibrillation with slow  ventricular response on Eliquis (however ran out last Tuesday, last dose 11/01/2017), single chamber pacemaker, obstructive sleep apnea on CPAP followed by Dr. Claiborne Billings, systemic hypertension, nonischemic cardiomyopathy with mildly depressed left ventricular systolic function, obesity, pulmonary hypertension, gout and BPH was admitted 03/28 w/ CP/SOB, anemia, GIB, elevated ez  Assessment & Plan     Principal Problem: 1.  GI bleed, acute on chronic - Dr Watt Climes consulted - s/p transfusion w/ improvement in H&H - per IM/GI  2.   Chronic systolic CHF: - need daily wts - volume status ok now, but need to watch carefully going forward - severe pulm htn and TR make exam abnl at baseline. - follow sx, continue to hold diuretics and follow BMET  3.   Chronic anticoagulation - on hold, restart when ok w/ GI  4.  OSA on CPAP - encourage compliance  5.  Chronic atrial fibrillation (HCC) - pacing much of the time  6.  Acute upper GI bleeding - per IM/GI, for EGD today  7.  Acute renal failure superimposed on stage 3 chronic kidney disease ([place]) - follow BMET  8.  Chest pain/ elevated troponin - resolved -Demand ischemia secondary to severe anemia with his underlying nonischemic cardiomyopathy.  This is not appear to be ACS.  - no further workup planned.   Jonetta Speak , PA-C 8:12 AM 11/04/2017 Pager: [phone]  Personally seen and examined. Agree with above.  84 year old male with acute on chronic GI bleed, currently holding Eliquis with permanent atrial fibrillation, pacemaker for backup, severe pulmonary hypertension, multiple falls, currently chest pain-free.  Troponin has elevated to 2.  Exam: Somewhat disheveled, but now talkative.  Yesterday was sleepy from Ativan.  Moving in bed.  Talking with GI PA.  No shortness of breath.  No chest pain currently.  Color looks improved after unit of blood.  Regular rate and rhythm with occasional ectopy, soft systolic murmur, lungs  are clear.  Labs: Troponin 2.5, hemoglobin is increased from 7-8.  Assessment and plan:  Demand ischemia -Troponin elevation in the setting of severe anemia.  Epigastric discomfort previously possibly secondary to gastric ulcer.  BC powders.  Treating acute GI bleed.  No IV heparin, no aspirin, no Eliquis.  In fact, he had run out of his Eliquis a few days ago at home, Tuesday.  Troponin elevation in the 2.5 range portends worsened prognosis overall from a cardiovascular perspective however.  If GI must pursue endoscopy, they may proceed with him at a currently moderate to high cardiovascular risk.  I would not advocate for any invasive cardiovascular workup at this time.  Prior ejection fraction in the 45% range.  Repeating echocardiogram.  Severe secondary pulmonary hypertension noted as well.  Permanent atrial fibrillation -I would be an advocate for stopping anticoagulation given his increase in multiple falls that include strikes to the head.    Pacemaker -Has back up.  Doing well.  Hemorrhagic shock - Improved after resuscitation with packed red blood cells.  Continue to hold antihypertensive medications, diuretics.   Acute kidney injury -Had a similar hospitalization to this previously. Creatinine was 4 previously.  Very similar.  Continue with resuscitation.  Critical care time 9 minutes-spent with patient, discussion with care team, extensive data review in this gentleman with demand ischemia, underlying pulmonary hypertension, acute on chronic GI bleeding on anticoagulation currently held with atrial fibrillation, pacemaker for backup critically ill at high risk from a cardiovascular morbidity and mortality standpoint.   Candee Furbish, MD

## 2017-11-04 NOTE — ED Notes (Signed)
Date and time results received: 11/04/17 1:36 AM (use smartphrase ".now" to insert current time)  Test: Troponin Critical Value: 2.64  Name of Provider Notified: Blout PA-C [facility] via text page  Orders Received? Or Actions Taken?: awaiting response

## 2017-11-05 ENCOUNTER — Encounter (HOSPITAL_COMMUNITY)
Admission: EM | Disposition: A | Discharge status: Home / Self Care | Payer: Self-pay | Source: Home / Self Care | Attending: Family Medicine

## 2017-11-05 ENCOUNTER — Inpatient Hospital Stay (HOSPITAL_COMMUNITY): Payer: Medicare Other | Admitting: Certified Registered"

## 2017-11-05 ENCOUNTER — Encounter (HOSPITAL_COMMUNITY): Payer: Self-pay | Admitting: *Deleted

## 2017-11-05 DIAGNOSIS — K922 Gastrointestinal hemorrhage, unspecified: Secondary | ICD-10-CM

## 2017-11-05 HISTORY — PX: ESOPHAGOGASTRODUODENOSCOPY (EGD) WITH PROPOFOL: SHX5813

## 2017-11-05 LAB — BASIC METABOLIC PANEL
ANION GAP: 10 (ref 5–15)
BUN: 107 mg/dL — AB (ref 6–20)
CALCIUM: 8.7 mg/dL — AB (ref 8.9–10.3)
CO2: 27 mmol/L (ref 22–32)
Chloride: 102 mmol/L (ref 101–111)
Creatinine, Ser: 2.03 mg/dL — ABNORMAL HIGH (ref 0.61–1.24)
GFR calc Af Amer: 33 mL/min — ABNORMAL LOW (ref >60)
GFR calc non Af Amer: 28 mL/min — ABNORMAL LOW (ref >60)
GLUCOSE: 94 mg/dL (ref 65–99)
Potassium: 4.1 mmol/L (ref 3.5–5.1)
Sodium: 139 mmol/L (ref 135–145)

## 2017-11-05 LAB — PREPARE RBC (CROSSMATCH)

## 2017-11-05 LAB — MRSA PCR SCREENING: MRSA by PCR: NEGATIVE

## 2017-11-05 SURGERY — ESOPHAGOGASTRODUODENOSCOPY (EGD) WITH PROPOFOL
Anesthesia: Monitor Anesthesia Care | Laterality: Left

## 2017-11-05 MED ORDER — DIPHENHYDRAMINE HCL 25 MG PO CAPS
25.0000 mg | ORAL_CAPSULE | Freq: Once | ORAL | Status: DC
  Filled 2017-11-05: qty 1

## 2017-11-05 MED ORDER — SODIUM CHLORIDE 0.9 % IV SOLN: Freq: Once | INTRAVENOUS | Status: DC

## 2017-11-05 MED ORDER — FUROSEMIDE 10 MG/ML IJ SOLN
20.0000 mg | Freq: Once | INTRAMUSCULAR | Status: AC
  Administered 2017-11-05: 20 mg via INTRAVENOUS

## 2017-11-05 MED ORDER — SODIUM CHLORIDE 0.9 % IV SOLN: INTRAVENOUS | Status: DC

## 2017-11-05 MED ORDER — PROPOFOL 10 MG/ML IV BOLUS
INTRAVENOUS | Status: DC | PRN
  Administered 2017-11-05: 20 mg via INTRAVENOUS
  Administered 2017-11-05: 30 mg via INTRAVENOUS

## 2017-11-05 MED ORDER — PROPOFOL 500 MG/50ML IV EMUL
INTRAVENOUS | Status: DC | PRN
  Administered 2017-11-05: 50 ug/kg/min via INTRAVENOUS

## 2017-11-05 MED ORDER — FUROSEMIDE 10 MG/ML IJ SOLN
20.0000 mg | Freq: Once | INTRAMUSCULAR | Status: AC
  Administered 2017-11-05: 20 mg via INTRAVENOUS
  Filled 2017-11-05 (×2): qty 2

## 2017-11-05 SURGICAL SUPPLY — 15 items

## 2017-11-05 NOTE — Interval H&P Note (Signed)
History and Physical Interval Note:  11/05/2017 12:12 PM  [name]  has presented today for surgery, with the diagnosis of melena, anemia  The various methods of treatment have been discussed with the patient and family. After consideration of risks, benefits and other options for treatment, the patient has consented to  Procedure(s): ESOPHAGOGASTRODUODENOSCOPY (EGD) WITH PROPOFOL (Left) as a surgical intervention .  The patient's history has been reviewed, patient examined, no change in status, stable for surgery.  I have reviewed the patient's chart and labs.  Questions were answered to the patient's satisfaction.     [place] C.

## 2017-11-05 NOTE — Progress Notes (Signed)
Progress Note  Patient Name: [name] Date of Encounter: 11/05/2017  Primary Cardiologist: [name], MD   Subjective   Getting more blood. No CP, no SOB.   Inpatient Medications    Scheduled Meds: . allopurinol  200 mg Oral Daily  . diphenhydrAMINE  25 mg Oral Once  . dutasteride  0.5 mg Oral Daily  . escitalopram  5 mg Oral QHS  . furosemide  20 mg Intravenous Once  . furosemide  20 mg Intravenous Once  . mouth rinse  15 mL Mouth Rinse BID  . mirtazapine  15 mg Oral QHS  . rOPINIRole  3 mg Oral QHS  . sodium chloride flush  10-40 mL Intracatheter Q12H  . tamsulosin  0.4 mg Oral QPC breakfast   Continuous Infusions: . sodium chloride    . lactated ringers 100 mL/hr at 11/05/17 0543  . pantoprozole (PROTONIX) infusion 8 mg/hr (11/05/17 0712)   PRN Meds: acetaminophen, morphine injection, ondansetron (ZOFRAN) IV, sodium chloride flush   Vital Signs    Vitals:   11/05/17 0424 11/05/17 0651 11/05/17 0945 11/05/17 1013  BP:   (!) 108/57 (!) 110/56  Pulse:   (!) 58 60  Resp:   14 16  Temp:   98.5 F (36.9 C) 98.3 F (36.8 C)  TempSrc:   Oral Oral  SpO2: 100% 93% 100% 100%  Weight:      Height:        Intake/Output Summary (Last 24 hours) at 11/05/2017 1040 Last data filed at 11/05/2017 0957 Gross per 24 hour  Intake 2303.33 ml  Output 2200 ml  Net 103.33 ml   Filed Weights   11/04/17 1919  Weight: 186 lb 8.2 oz (84.6 kg)    Telemetry    Paced V - Personally Reviewed  ECG    V pacing, afib underlying - Personally Reviewed  Physical Exam   GEN: No acute distress.   Neck: No JVD Cardiac: RRR occasional ectopy, systolic murmur, no rubs, or gallops.  Respiratory: Clear to auscultation bilaterally. GI: Soft, mild epigastric tenderness, non-distended  MS: No edema; No deformity. Neuro:  Nonfocal  Psych: Normal affect   Labs    Chemistry Recent Labs  Lab 11/03/17 1153 11/04/17 1139 11/05/17 0354  NA 134* 139 139  K 4.7 4.0 4.1    CL 94* 101 102  CO2 27 24 27   GLUCOSE 108* 70 94  BUN 149* 113* 107*  CREATININE 3.68* 2.18* 2.03*  CALCIUM 9.0 8.6* 8.7*  GFRNONAA 14* 26* 28*  GFRAA 16* 30* 33*  ANIONGAP 13 14 10      Hematology Recent Labs  Lab 11/03/17 1153 11/04/17 0458 11/04/17 1139  WBC 2.7* 5.4  --   RBC 1.89* 2.35*  --   HGB 7.0* 8.0* 7.2*  HCT 20.8* 24.9* 21.9*  MCV 110.1* 106.0*  --   MCH 37.0* 34.0  --   MCHC 33.7 32.1  --   RDW 18.0* 20.6*  --   PLT 109* 108*  --     Cardiac Enzymes Recent Labs  Lab 11/03/17 2357 11/04/17 0458 11/04/17 1139  TROPONINI 2.64* 2.56* 2.30*    Recent Labs  Lab 11/03/17 1158  TROPIPOC 0.47*     BNP Recent Labs  Lab 11/03/17 1344  BNP 1,728.6*     DDimer No results for input(s): DDIMER in the last 168 hours.   Radiology    Dg Chest 2 View  Result Date: 11/03/2017 CLINICAL DATA:  84 year old male with history of  substernal chest pain and shortness of breath yesterday and today. EXAM: CHEST - 2 VIEW COMPARISON:  Chest x-ray 09/30/2017. FINDINGS: There is cephalization of the pulmonary vasculature and slight indistinctness of the interstitial markings suggestive of mild pulmonary edema. No pleural effusions. Mild cardiomegaly. Upper mediastinal contours are within normal limits. Aortic atherosclerosis. Left sided pacemaker with single lead projecting over the expected location of the right ventricular apex. IMPRESSION: 1. The appearance the chest is most compatible with mild congestive heart failure, as above. 2. Aortic atherosclerosis. Electronically Signed   By: Vinnie Langton M.D.   On: 11/03/2017 12:21   Ct Head Wo Contrast  Result Date: 11/03/2017 CLINICAL DATA:  Multiple falls at home, posttraumatic headache, weakness, bruising on face in arms, history of coronary artery disease post MI, non ischemic cardiomyopathy, CHF, stage III chronic kidney disease, hypertension, permanent atrial fibrillation, pulmonary hypertension EXAM: CT HEAD WITHOUT  CONTRAST CT CERVICAL SPINE WITHOUT CONTRAST TECHNIQUE: Multidetector CT imaging of the head and cervical spine was performed following the standard protocol without intravenous contrast. Multiplanar CT image reconstructions of the cervical spine were also generated. COMPARISON:  04/05/2017 FINDINGS: CT HEAD FINDINGS Brain: Motion artifacts near vertex. Generalized atrophy. Normal ventricular morphology. No midline shift or mass effect. Small vessel chronic ischemic changes of deep cerebral white matter. No intracranial hemorrhage, mass lesion or evidence acute infarction identified within limitations of motion. Small old lacunar infarct at posterior limb of LEFT internal capsule. No extra-axial fluid collections. Vascular: Atherosclerotic calcification of internal carotid arteries at skull base Skull: Grossly intact Sinuses/Orbits: Clear Other: N/A CT CERVICAL SPINE FINDINGS Alignment: Imaging degraded by patient motion. Alignment grossly normal. Skull base and vertebrae: Visualized skull base intact. Bones demineralized. Vertebral body heights appear maintained. No definite fracture, subluxation, or bone destruction. Multilevel facet degenerative changes. Soft tissues and spinal canal: Prevertebral soft tissues normal thickness. Disc levels: Mild AP narrowing of spinal canal due to endplate spur formation at C5-C6. Disc space narrowing at C4-C5 through C6-C7. Upper chest: Lung apices clear Other: Atherosclerotic calcifications within the carotid systems. IMPRESSION: Motion artifacts on both CT head and CT cervical spine exams. Atrophy with small vessel chronic ischemic changes of deep cerebral white matter. Tiny old lacunar infarct at posterior limb LEFT internal capsule. No definite acute intracranial abnormalities. Multilevel degenerative disc and facet disease changes of the cervical spine. No definite acute cervical spine abnormalities. Electronically Signed   By: Lavonia Dana M.D.   On: 11/03/2017 18:01   Ct  Cervical Spine Wo Contrast  Result Date: 11/03/2017 CLINICAL DATA:  Multiple falls at home, posttraumatic headache, weakness, bruising on face in arms, history of coronary artery disease post MI, non ischemic cardiomyopathy, CHF, stage III chronic kidney disease, hypertension, permanent atrial fibrillation, pulmonary hypertension EXAM: CT HEAD WITHOUT CONTRAST CT CERVICAL SPINE WITHOUT CONTRAST TECHNIQUE: Multidetector CT imaging of the head and cervical spine was performed following the standard protocol without intravenous contrast. Multiplanar CT image reconstructions of the cervical spine were also generated. COMPARISON:  04/05/2017 FINDINGS: CT HEAD FINDINGS Brain: Motion artifacts near vertex. Generalized atrophy. Normal ventricular morphology. No midline shift or mass effect. Small vessel chronic ischemic changes of deep cerebral white matter. No intracranial hemorrhage, mass lesion or evidence acute infarction identified within limitations of motion. Small old lacunar infarct at posterior limb of LEFT internal capsule. No extra-axial fluid collections. Vascular: Atherosclerotic calcification of internal carotid arteries at skull base Skull: Grossly intact Sinuses/Orbits: Clear Other: N/A CT CERVICAL SPINE FINDINGS Alignment: Imaging degraded by patient  motion. Alignment grossly normal. Skull base and vertebrae: Visualized skull base intact. Bones demineralized. Vertebral body heights appear maintained. No definite fracture, subluxation, or bone destruction. Multilevel facet degenerative changes. Soft tissues and spinal canal: Prevertebral soft tissues normal thickness. Disc levels: Mild AP narrowing of spinal canal due to endplate spur formation at C5-C6. Disc space narrowing at C4-C5 through C6-C7. Upper chest: Lung apices clear Other: Atherosclerotic calcifications within the carotid systems. IMPRESSION: Motion artifacts on both CT head and CT cervical spine exams. Atrophy with small vessel chronic  ischemic changes of deep cerebral white matter. Tiny old lacunar infarct at posterior limb LEFT internal capsule. No definite acute intracranial abnormalities. Multilevel degenerative disc and facet disease changes of the cervical spine. No definite acute cervical spine abnormalities. Electronically Signed   By: Lavonia Dana M.D.   On: 11/03/2017 18:01   Dg Chest Port 1 View  Result Date: 11/03/2017 CLINICAL DATA:  PICC line placement EXAM: PORTABLE CHEST 1 VIEW COMPARISON:  Chest x-ray of 11/03/2016 FINDINGS: Right-sided PICC line is now present with the tip seen to the lower SVC. No pneumothorax is seen. Moderate cardiomegaly is present and permanent pacemaker remains. Mild pulmonary vascular congestion cannot be excluded. There are degenerative changes noted in both shoulders. IMPRESSION: 1. Right-sided PICC line placed with the tip seen to the lower SVC. No pneumothorax. 2. Stable moderate cardiomegaly. Cannot exclude mild pulmonary vascular congestion. Electronically Signed   By: Ivar Drape M.D.   On: 11/03/2017 17:16   [place] Ekg Site Rite  Result Date: 11/03/2017 If Site Rite image not attached, placement could not be confirmed due to current cardiac rhythm.   Cardiac Studies    ECHO:  09/21/2017 - Left ventricle: The cavity size was moderately dilated. There was mild concentric hypertrophy. Systolic function was severely reduced. The estimated ejection fraction was in the range of 25% to 30%. The study is not technically sufficient to allow evaluation of LV diastolic function. (Dr C feels EF 40-45%, close to previous baseline) - Mitral valve: There was mild regurgitation. - Left atrium: The atrium was moderately dilated. - Right ventricle: The cavity size was moderately dilated. - Right atrium: The atrium was moderately dilated. - Atrial septum: No defect or patent foramen ovale was identified. - Tricuspid valve: There was severe regurgitation. - Pulmonary arteries: PA peak  pressure: 111 mm Hg (S).    Patient Profile     84 y.o. male permanent atrial fibrillation with slow ventricular responseon Eliquis (however ran out last Tuesday, last dose 11/01/2017), single chamber pacemaker, obstructive sleep apneaon CPAP followed by Dr. Claiborne Billings, systemic hypertension, nonischemic cardiomyopathy with depressed left ventricular systolic function, obesity,pulmonary hypertension,gout and BPHwas admitted 03/28 w/ CP/SOB, anemia, GIB, elevated ez    Assessment & Plan    Chronic systolic HF  - euvolemic  - watch O2 sats, got one dose of 20mg  IV lasix this AM  - for now continue to hold all other diuretics as we resuscitate.   Pacer  - proper function  GIB  - GI note reviewed.   - IV PPI. EGD P  AFIB perm  - off Eliquis. Would not resume. Falls. GIB  Anemia, blood loss.   - getting more PRBC  For questions or updates, please contact [place] Please consult [email or web address] for contact info under Cardiology/STEMI.      Signed, Candee Furbish, MD  11/05/2017, 10:40 AM

## 2017-11-05 NOTE — Progress Notes (Signed)
Hospitalist progress note   [name]  WCB:[id number] DOB: 05/11/1932 DOA: 11/03/2017 PCP: [name], MD   Specialists:  Cardiology consulted in the emergency room Gastroenterology consulted by emergency room   Brief Narrative:  84 year old male, atrial fibrillation on Coumadin chads score >4 with pacemaker placement-[employer] 2013, nonischemic cardiomyopathy on cath 2010, MI in year 2000 BPH and chronic urinary retention, prior bleeding from varicose veins, transaminitis, bipolar, obesity, OSA on CPAP, sundowning and cognitive decline, gout, hyperlipidemia, ruptured sigmoid diverticulitis status post bowel resection 04/22/2006-Follows with [facility] in outpatient setting Dr. Guinevere Scarlet had upper endoscopy performed--showed erosive esophagitis   Recent admission 2/18-2/19 acute/chronic respiratory failure discharged home Recent admission 2/22-2/24 2019 Acute/chronic systolic HF EF 61-60% 7/37/10-GYIRSWNIOEV noted and felt to be more 40-45%: Felt to need possible pulmonary referral-had been admitted for scrotal edema as well as overall swelling  Readmit 3/28 chest pain dyspnea on exertion hemoglobin 7 in a setting of taking NSAIDs and BC powder as a substitute for chronic pain--note recently ran out of Eliquis  Assessment & Plan:   Hypovolemic shock on admission-likely secondary to blood loss 7-given LR 100 cc/h kept n.p.o., started IV PPI gtt., 2 large-bore IV e-blood pressures have resolved to some degree to the low 035 systolic range with IV fluid repletion  Anemia from upper GI bleed-Hb 8.7--7--1 unit--7.2 -Giving 2 more units of blood and will recheck in a.m. Count -Waiting on results of EGD per GI and will then discuss with family   Chest pain initially felt?  Demand ischemia as per cardiology-prior exam by cardiology more epigastric pressure and pain.   -Holding Aldactone 25, Lasix 80 twice daily for now Troponin peak 2.5 EKG repeat this a.m. shows RBB,  junctional rhythm -as per cardiology further workup --PTA nitroglycerin has been held  Acute/chronic renal failure BUN/creatinine baseline 2/24 = 49/1.48--149/3.6 -BUN/creatinine now down to 107/2.0  -upper GI bleed ? azotemia   OSA on CPAP-if patient able to use will continue the same  BPH-would continue Flomax 0.4 as well as Avodart 0.5  Gout-was taking allopurinol 200 daily, colchicine 0.6 as needed which have been held -we will need refills of tramadol on discharge  Possible dementia-continue Lexapro 5 at bedtime, Remeron 15 at bedtime   DVT prophylaxis: SCD code Status:   Presumed full code   family Communication:    disposition Plan: Inpatient stepdown status   Consultants:   Cardiology  Gastroenterology  Procedures:   None yet  Antimicrobials:   None  Subjective:  Awake alert pleasant but a little cantankerous as has not eaten Still having dark stools per patient No chest pain No fever no chills No nausea no vomiting but some epigastric tenderness more oriented this morning    Objective: Vitals:   11/05/17 0424 11/05/17 0651 11/05/17 0945 11/05/17 1013  BP:   (!) 108/57 (!) 110/56  Pulse:   (!) 58 60  Resp:   14 16  Temp:   98.5 F (36.9 C) 98.3 F (36.8 C)  TempSrc:   Oral Oral  SpO2: 100% 93% 100% 100%  Weight:      Height:        Intake/Output Summary (Last 24 hours) at 11/05/2017 1155 Last data filed at 11/05/2017 0957 Gross per 24 hour  Intake 2053.33 ml  Output 750 ml  Net 1303.33 ml   Filed Weights   11/04/17 1919  Weight: 84.6 kg (186 lb 8.2 oz)    Examination:  Pleasant alert slightly more oriented this morning-currently not  on oxygen  EOMI NCAT no JVD T2-I7 holosystolic murmur Chest is clinically clear without added sound Epigastric tenderness is about the same Mild lower extremity edema Neurologically grossly intact and moves limbs equally    Data Reviewed: I have personally reviewed following labs and imaging  studies  CBC: Recent Labs  Lab 11/03/17 1153 11/04/17 0458 11/04/17 1139  WBC 2.7* 5.4  --   HGB 7.0* 8.0* 7.2*  HCT 20.8* 24.9* 21.9*  MCV 110.1* 106.0*  --   PLT 109* 108*  --    Basic Metabolic Panel: Recent Labs  Lab 11/03/17 1153 11/03/17 1344 11/04/17 1139 11/05/17 0354  NA 134*  --  139 139  K 4.7  --  4.0 4.1  CL 94*  --  101 102  CO2 27  --  24 27  GLUCOSE 108*  --  70 94  BUN 149*  --  113* 107*  CREATININE 3.68*  --  2.18* 2.03*  CALCIUM 9.0  --  8.6* 8.7*  MG  --  2.2  --   --    GFR: Estimated Creatinine Clearance: 28.2 mL/min (A) (by C-G formula based on SCr of 2.03 mg/dL (H)). Liver Function Tests: No results for input(s): AST, ALT, ALKPHOS, BILITOT, PROT, ALBUMIN in the last 168 hours. No results for input(s): LIPASE, AMYLASE in the last 168 hours. No results for input(s): AMMONIA in the last 168 hours. Coagulation Profile: No results for input(s): INR, PROTIME in the last 168 hours. Cardiac Enzymes:  Radiology Studies: Reviewed images personally in health database   Scheduled Meds: . [MAR Hold] allopurinol  200 mg Oral Daily  . [MAR Hold] diphenhydrAMINE  25 mg Oral Once  . [MAR Hold] dutasteride  0.5 mg Oral Daily  . [MAR Hold] escitalopram  5 mg Oral QHS  . [MAR Hold] furosemide  20 mg Intravenous Once  . [MAR Hold] furosemide  20 mg Intravenous Once  . [MAR Hold] mouth rinse  15 mL Mouth Rinse BID  . [MAR Hold] mirtazapine  15 mg Oral QHS  . [MAR Hold] rOPINIRole  3 mg Oral QHS  . [MAR Hold] sodium chloride flush  10-40 mL Intracatheter Q12H  . [MAR Hold] tamsulosin  0.4 mg Oral QPC breakfast   Continuous Infusions: . sodium chloride    . [MAR Hold] sodium chloride    . lactated ringers 100 mL/hr at 11/05/17 0543  . pantoprozole (PROTONIX) infusion 8 mg/hr (11/05/17 0712)     LOS: 2 days    Time : 25 minutes  Verneita Griffes, MD [facility]  If 7PM-7AM, please contact night-coverage [email or web address] Password  [facility] 11/05/2017, 11:55 AM

## 2017-11-05 NOTE — Op Note (Signed)
[facility] Patient Name: [name] Procedure Date : 11/05/2017 MRN: [id number] Attending MD: Lear Ng , MD Date of Birth: 05-Aug-1932 CSN: [id number] Age: 84 Admit Type: Inpatient Procedure:                Upper GI endoscopy Indications:              Melena Providers:                Lear Ng, MD, Angus Seller William Dalton, Technician Referring MD:             hospital team Medicines:                Propofol per Anesthesia, Monitored Anesthesia Care Complications:            No immediate complications. Estimated Blood Loss:     Estimated blood loss: none. Procedure:                Pre-Anesthesia Assessment:                           - Prior to the procedure, a History and Physical                            was performed, and patient medications and                            allergies were reviewed. The patient's tolerance of                            previous anesthesia was also reviewed. The risks                            and benefits of the procedure and the sedation                            options and risks were discussed with the patient.                            All questions were answered, and informed consent                            was obtained. Prior Anticoagulants: The patient has                            taken Eliquis (apixaban), last dose was 4 days                            prior to procedure. ASA Grade Assessment: III - A                            patient with severe systemic disease. After  reviewing the risks and benefits, the patient was                            deemed in satisfactory condition to undergo the                            procedure.                           After obtaining informed consent, the endoscope was                            passed under direct vision. Throughout the                            procedure, the patient's blood  pressure, pulse, and                            oxygen saturations were monitored continuously. The                            Endoscope was introduced through the mouth, and                            advanced to the second part of duodenum. The upper                            GI endoscopy was accomplished without difficulty.                            The patient tolerated the procedure fairly well. Scope In: Scope Out: Findings:      The examined esophagus was normal.      The Z-line was regular and was found 40 cm from the incisors.      A few small bleeding angiodysplastic lesions were found in the gastric       antrum. Fulguration to stop the bleeding by argon plasma was successful.       Estimated blood loss: none.      A small hiatal hernia was present.      A single diminutive angiodysplastic lesion with stigmata of recent       bleeding was found in the duodenal bulb. Fulguration to ablate the       lesion to prevent bleeding by argon plasma was successful. Estimated       blood loss: none.      The exam of the duodenum was otherwise normal.      A few localized, small non-bleeding erosions were found on the lesser       curvature of the stomach. There were no stigmata of recent bleeding. Impression:               - Normal esophagus.                           - Z-line regular, 40 cm from the incisors.                           -  A few bleeding angiodysplastic lesions in the                            stomach. Treated with argon plasma coagulation                            (APC).                           - Small hiatal hernia.                           - A single recently bleeding angiodysplastic lesion                            in the duodenum. Treated with argon plasma                            coagulation (APC).                           - Non-bleeding erosive gastropathy.                           - No specimens collected. Moderate Sedation:      N/A - MAC  procedure Recommendation:           - Observe patient's clinical course.                           - Clear liquid diet.                           - Post procedure medication orders were given. Procedure Code(s):        --- Professional ---                           [phone], Esophagogastroduodenoscopy, flexible,                            transoral; with control of bleeding, any method Diagnosis Code(s):        --- Professional ---                           K92.1, Melena (includes Hematochezia)                           K31.811, Angiodysplasia of stomach and duodenum                            with bleeding                           K44.9, Diaphragmatic hernia without obstruction or                            gangrene  K31.89, Other diseases of stomach and duodenum CPT copyright 2016 American [employer] Association. All rights reserved. The codes documented in this report are preliminary and upon coder review may  be revised to meet current compliance requirements. Lear Ng, MD 11/05/2017 12:49:57 PM This report has been signed electronically. Number of Addenda: 0

## 2017-11-05 NOTE — Anesthesia Preprocedure Evaluation (Addendum)
Anesthesia Evaluation  Patient identified by MRN, date of birth, ID band Patient awake    Reviewed: Allergy & Precautions, NPO status , Patient's Chart, lab work & pertinent test results  Airway Mallampati: I  TM Distance: >3 FB Neck ROM: Full    Dental  (+) Edentulous Upper, Edentulous Lower   Pulmonary sleep apnea and Continuous Positive Airway Pressure Ventilation , former smoker,     + decreased breath sounds      Cardiovascular hypertension, + CAD, + Past MI and +CHF  + dysrhythmias Atrial Fibrillation + pacemaker  Rhythm:Irregular Rate:Normal     Neuro/Psych PSYCHIATRIC DISORDERS Depression    GI/Hepatic Neg liver ROS, GERD  ,  Endo/Other  negative endocrine ROS  Renal/GU CRFRenal disease  negative genitourinary   Musculoskeletal  (+) Arthritis , Osteoarthritis,    Abdominal   Peds  Hematology negative hematology ROS (+)   Anesthesia Other Findings - HLD  Reproductive/Obstetrics                           Anesthesia Physical Anesthesia Plan  ASA: III  Anesthesia Plan: MAC   Post-op Pain Management:    Induction: Intravenous  PONV Risk Score and Plan: 0  Airway Management Planned: Nasal Cannula  Additional Equipment: None  Intra-op Plan:   Post-operative Plan:   Informed Consent: I have reviewed the patients History and Physical, chart, labs and discussed the procedure including the risks, benefits and alternatives for the proposed anesthesia with the patient or authorized representative who has indicated his/her understanding and acceptance.   Dental advisory given  Plan Discussed with: CRNA  Anesthesia Plan Comments:         Anesthesia Quick Evaluation

## 2017-11-05 NOTE — Anesthesia Postprocedure Evaluation (Signed)
Anesthesia Post Note  Patient: [name]  Procedure(s) Performed: ESOPHAGOGASTRODUODENOSCOPY (EGD) WITH PROPOFOL (Left )     Patient location during evaluation: PACU Anesthesia Type: MAC Level of consciousness: awake and alert Pain management: pain level controlled Vital Signs Assessment: post-procedure vital signs reviewed and stable Respiratory status: spontaneous breathing, nonlabored ventilation, respiratory function stable and patient connected to nasal cannula oxygen Cardiovascular status: stable and blood pressure returned to baseline Postop Assessment: no apparent nausea or vomiting Anesthetic complications: no    Last Vitals:  Vitals:   11/05/17 1320 11/05/17 1400  BP: (!) 108/53 (!) 103/51  Pulse: 65 61  Resp: 18 18  Temp: (!) 36.3 C (!) 36.2 C  SpO2: 93% 99%    Last Pain:  Vitals:   11/05/17 1400  TempSrc: Axillary  PainSc:                  Effie Berkshire

## 2017-11-05 NOTE — Transfer of Care (Signed)
Immediate Anesthesia Transfer of Care Note  Patient: [name]  Procedure(s) Performed: ESOPHAGOGASTRODUODENOSCOPY (EGD) WITH PROPOFOL (Left )  Patient Location: Endoscopy Unit  Anesthesia Type:MAC  Level of Consciousness: responds to stimulation  Airway & Oxygen Therapy: Patient Spontanous Breathing and Patient connected to nasal cannula oxygen  Post-op Assessment: Report given to RN and Post -op Vital signs reviewed and stable  Post vital signs: Reviewed and stable  Last Vitals:  Vitals Value Taken Time  BP    Temp    Pulse    Resp    SpO2      Last Pain:  Vitals:   11/05/17 1145  TempSrc: Oral  PainSc: 0-No pain         Complications: No apparent anesthesia complications

## 2017-11-05 NOTE — Anesthesia Procedure Notes (Signed)
Procedure Name: MAC Date/Time: 11/05/2017 12:14 PM Performed by: Babs Bertin, CRNA Pre-anesthesia Checklist: Patient identified, Emergency Drugs available, Suction available, Patient being monitored and Timeout performed Patient Re-evaluated:Patient Re-evaluated prior to induction Oxygen Delivery Method: Nasal cannula

## 2017-11-06 ENCOUNTER — Encounter (HOSPITAL_COMMUNITY): Payer: Self-pay | Admitting: Gastroenterology

## 2017-11-06 LAB — TYPE AND SCREEN
ABO/RH(D): A NEG
Antibody Screen: NEGATIVE
UNIT DIVISION: 0
UNIT DIVISION: 0
Unit division: 0
Unit division: 0

## 2017-11-06 LAB — BPAM RBC
BLOOD PRODUCT EXPIRATION DATE: 201904062359
Blood Product Expiration Date: 201904082359
Blood Product Expiration Date: 201904082359
Blood Product Expiration Date: 201904082359
ISSUE DATE / TIME: 201903281833
ISSUE DATE / TIME: 201903290211
ISSUE DATE / TIME: 201903300948
ISSUE DATE / TIME: 201903301339
UNIT TYPE AND RH: 600
UNIT TYPE AND RH: 600
Unit Type and Rh: 600
Unit Type and Rh: 600

## 2017-11-06 LAB — CBC WITH DIFFERENTIAL/PLATELET
BASOS PCT: 0 %
Basophils Absolute: 0 10*3/uL (ref 0.0–0.1)
EOS ABS: 0 10*3/uL (ref 0.0–0.7)
Eosinophils Relative: 1 %
HCT: 26 % — ABNORMAL LOW (ref 39.0–52.0)
HEMOGLOBIN: 8.5 g/dL — AB (ref 13.0–17.0)
LYMPHS PCT: 18 %
Lymphs Abs: 0.7 10*3/uL (ref 0.7–4.0)
MCH: 33.6 pg (ref 26.0–34.0)
MCHC: 32.7 g/dL (ref 30.0–36.0)
MCV: 102.8 fL — ABNORMAL HIGH (ref 78.0–100.0)
Monocytes Absolute: 0.3 10*3/uL (ref 0.1–1.0)
Monocytes Relative: 8 %
NEUTROS PCT: 73 %
Neutro Abs: 2.9 10*3/uL (ref 1.7–7.7)
Platelets: 79 10*3/uL — ABNORMAL LOW (ref 150–400)
RBC: 2.53 MIL/uL — AB (ref 4.22–5.81)
RDW: 22.2 % — ABNORMAL HIGH (ref 11.5–15.5)
WBC: 3.9 10*3/uL — ABNORMAL LOW (ref 4.0–10.5)

## 2017-11-06 LAB — COMPREHENSIVE METABOLIC PANEL
ALK PHOS: 46 U/L (ref 38–126)
ALT: 19 U/L (ref 17–63)
AST: 35 U/L (ref 15–41)
Albumin: 2.8 g/dL — ABNORMAL LOW (ref 3.5–5.0)
Anion gap: 7 (ref 5–15)
BUN: 66 mg/dL — AB (ref 6–20)
CALCIUM: 8.5 mg/dL — AB (ref 8.9–10.3)
CO2: 31 mmol/L (ref 22–32)
CREATININE: 1.38 mg/dL — AB (ref 0.61–1.24)
Chloride: 101 mmol/L (ref 101–111)
GFR calc non Af Amer: 45 mL/min — ABNORMAL LOW (ref >60)
GFR, EST AFRICAN AMERICAN: 53 mL/min — AB (ref >60)
Glucose, Bld: 119 mg/dL — ABNORMAL HIGH (ref 65–99)
Potassium: 3.3 mmol/L — ABNORMAL LOW (ref 3.5–5.1)
SODIUM: 139 mmol/L (ref 135–145)
Total Bilirubin: 1.7 mg/dL — ABNORMAL HIGH (ref 0.3–1.2)
Total Protein: 5.3 g/dL — ABNORMAL LOW (ref 6.5–8.1)

## 2017-11-06 MED ORDER — PANTOPRAZOLE SODIUM 40 MG IV SOLR
40.0000 mg | Freq: Two times a day (BID) | INTRAVENOUS | Status: DC
  Administered 2017-11-06 – 2017-11-08 (×5): 40 mg via INTRAVENOUS
  Filled 2017-11-06 (×6): qty 40

## 2017-11-06 NOTE — Progress Notes (Signed)
Progress Note  Patient Name: [name] Date of Encounter: 11/06/2017  Primary Cardiologist: [name], MD   Subjective   Feeling better.  Still having some epigastric discomfort.  He states that he had a black stool with what looks like it earthworm in it.  Denies any shortness of breath.  Inpatient Medications    Scheduled Meds: . allopurinol  200 mg Oral Daily  . diphenhydrAMINE  25 mg Oral Once  . dutasteride  0.5 mg Oral Daily  . escitalopram  5 mg Oral QHS  . mouth rinse  15 mL Mouth Rinse BID  . mirtazapine  15 mg Oral QHS  . rOPINIRole  3 mg Oral QHS  . sodium chloride flush  10-40 mL Intracatheter Q12H  . tamsulosin  0.4 mg Oral QPC breakfast   Continuous Infusions: . sodium chloride    . lactated ringers 100 mL/hr at 11/06/17 1020  . pantoprozole (PROTONIX) infusion 8 mg/hr (11/06/17 0439)   PRN Meds: acetaminophen, morphine injection, ondansetron (ZOFRAN) IV, sodium chloride flush   Vital Signs    Vitals:   11/05/17 2040 11/06/17 0031 11/06/17 0431 11/06/17 0510  BP: 106/62 122/70 116/64   Pulse: (!) 59 61 62   Resp: 19 16 12    Temp: 98 F (36.7 C) 98 F (36.7 C) 98.1 F (36.7 C) 98 F (36.7 C)  TempSrc: Oral  Oral Oral  SpO2: 100% 100% 100%   Weight:      Height:        Intake/Output Summary (Last 24 hours) at 11/06/2017 1106 Last data filed at 11/06/2017 0700 Gross per 24 hour  Intake 2265 ml  Output 1770 ml  Net 495 ml   Filed Weights   11/04/17 1919  Weight: 186 lb 8.2 oz (84.6 kg)    Telemetry    Ventricular pacing- Personally Reviewed  ECG    No new- Personally Reviewed  Physical Exam   GEN: No acute distress elderly, somewhat disheveled.   Neck: No JVD Cardiac: RRR occasional ectopy, no murmurs, rubs, or gallops.  Pacemaker Respiratory: Clear to auscultation bilaterally. GI: Soft, nontender, non-distended  MS: No edema; No deformity. Neuro:  Nonfocal  Psych: Normal affect   Labs    Chemistry Recent Labs    Lab 11/04/17 1139 11/05/17 0354 11/06/17 0416  NA 139 139 139  K 4.0 4.1 3.3*  CL 101 102 101  CO2 24 27 31   GLUCOSE 70 94 119*  BUN 113* 107* 66*  CREATININE 2.18* 2.03* 1.38*  CALCIUM 8.6* 8.7* 8.5*  PROT  --   --  5.3*  ALBUMIN  --   --  2.8*  AST  --   --  35  ALT  --   --  19  ALKPHOS  --   --  46  BILITOT  --   --  1.7*  GFRNONAA 26* 28* 45*  GFRAA 30* 33* 53*  ANIONGAP 14 10 7      Hematology Recent Labs  Lab 11/03/17 1153 11/04/17 0458 11/04/17 1139 11/06/17 0416  WBC 2.7* 5.4  --  3.9*  RBC 1.89* 2.35*  --  2.53*  HGB 7.0* 8.0* 7.2* 8.5*  HCT 20.8* 24.9* 21.9* 26.0*  MCV 110.1* 106.0*  --  102.8*  MCH 37.0* 34.0  --  33.6  MCHC 33.7 32.1  --  32.7  RDW 18.0* 20.6*  --  22.2*  PLT 109* 108*  --  79*    Cardiac Enzymes Recent Labs  Lab 11/03/17  2357 11/04/17 0458 11/04/17 1139  TROPONINI 2.64* 2.56* 2.30*    Recent Labs  Lab 11/03/17 1158  TROPIPOC 0.47*     BNP Recent Labs  Lab 11/03/17 1344  BNP 1,728.6*     DDimer No results for input(s): DDIMER in the last 168 hours.   Radiology    No results found.  Cardiac Studies    ECHO:09/21/2017 - Left ventricle: The cavity size was moderately dilated. There was mild concentric hypertrophy. Systolic function was severely reduced. The estimated ejection fraction was in the range of 25% to 30%. The study is not technically sufficient to allow evaluation of LV diastolic function.(Dr C feels EF 40-45%, close to previous baseline) - Mitral valve: There was mild regurgitation. - Left atrium: The atrium was moderately dilated. - Right ventricle: The cavity size was moderately dilated. - Right atrium: The atrium was moderately dilated. - Atrial septum: No defect or patent foramen ovale was identified. - Tricuspid valve: There was severe regurgitation. - Pulmonary arteries: PA peak pressure: 111 mm Hg (S).     Patient Profile     84 y.o. male with acute GI bleed from  duodenal angiodysplasia treated with argon laser by Dr. Michail Sermon with permanent atrial fibrillation, pacemaker single-chamber, obstructive sleep apnea on CPAP followed by Dr. Claiborne Billings, nonischemic cardiomyopathy, obesity, secondary pulmonary hypertension, gout, BPH.  Assessment & Plan    Nonischemic cardiomyopathy, chronic systolic heart failure -At home he is not on ACE inhibitor or beta-blocker because of presumed previous hypotension as well as renal insufficiency, chronic kidney disease.  Has pacemaker for backup.  He was taking spironolactone 25 mg once a day but at some point stopped this on his own.  Lasix 80 mg twice a day was noted on his home medication sheets.  For now, Lasix has been on hold because of resuscitation efforts and acute kidney injury with creatinine of 4 on admission.  Currently 1.38.  He is satting well, no signs of pulmonary edema.  I think that tomorrow, we can likely restart Lasix perhaps at 40 mg  once a day and monitor.  If his blood pressure allows, one could consider utilization of Toprol or carvedilol since he is pacemaker for backup given his underlying cardiomyopathy.  Permanent atrial fibrillation -Given his recent GI bleed, several falls recently including falls resulting in head trauma, I think would be a good idea to stop his anticoagulation as risk at this time outweighs the benefit.  This can be considered once again in the outpatient setting if he proves stability.  Of course, this increases his risk of stroke.  GI bleed -Duodenal angiodysplasia, argon laser -Appreciate a suggestion from GI when we could consider restarting anticoagulation with Eliquis.  Perhaps we should wait at least a month and even then, risks may outweigh benefits.  Discussed with Dr. Verlon Au.  Signing off. Will get follow up in 2 weeks with Dr. Loletha Grayer or APP.   For questions or updates, please contact [place] Please consult [email or web address] for contact info under Cardiology/STEMI.       Signed, Candee Furbish, MD  11/06/2017, 11:06 AM

## 2017-11-06 NOTE — Progress Notes (Signed)
[facility] Gastroenterology Progress Note  [name] 84 y.o. [birth year]   Subjective: Denies abdominal pain. Feels ok. Sitting in bedside chair.  Objective: Vital signs: Vitals:   11/06/17 0431 11/06/17 0510  BP: 116/64   Pulse: 62   Resp: 12   Temp: 98.1 F (36.7 C) 98 F (36.7 C)  SpO2: 100%     Physical Exam: Gen: alert, elderly, well-nourished, no acute distress  HEENT: anicteric sclera CV: RRR Chest: CTA B Abd: epigastric tenderness with minimal guarding, soft, nondistended, +BS  Lab Results: Recent Labs    11/03/17 1344  11/05/17 0354 11/06/17 0416  NA  --    < > 139 139  K  --    < > 4.1 3.3*  CL  --    < > 102 101  CO2  --    < > 27 31  GLUCOSE  --    < > 94 119*  BUN  --    < > 107* 66*  CREATININE  --    < > 2.03* 1.38*  CALCIUM  --    < > 8.7* 8.5*  MG 2.2  --   --   --    < > = values in this interval not displayed.   Recent Labs    11/06/17 0416  AST 35  ALT 19  ALKPHOS 46  BILITOT 1.7*  PROT 5.3*  ALBUMIN 2.8*   Recent Labs    11/04/17 0458 11/04/17 1139 11/06/17 0416  WBC 5.4  --  3.9*  NEUTROABS  --   --  2.9  HGB 8.0* 7.2* 8.5*  HCT 24.9* 21.9* 26.0*  MCV 106.0*  --  102.8*  PLT 108*  --  79*      Assessment/Plan: S/P GI bleed with EGD showing a few gastric/duodenal AVMs that were fulgurated yesterday. S/P 2 U PRBCs on 11/05/17 and Hgb 8.5 (7.2). Change to full liquid diet. D/C Protonix drip and change to Protonix 40 mg IV Q 12 hours. [facility] will f/u tomorrow.    [place] C. 11/06/2017, 12:05 PM  Pager [phone]  AFTER 5 PM or on weekends please call [phone] ID: [name], male   DOB: 1931-08-13, 84 y.o.   MRN: [id number]

## 2017-11-06 NOTE — Progress Notes (Signed)
Hospitalist progress note   [name]  IRJ:[id number] DOB: 12/08/1931 DOA: 11/03/2017 PCP: [name], MD   Specialists:  Cardiology consulted in the emergency room Gastroenterology consulted by emergency room   Brief Narrative:  84 year old male, atrial fibrillation on Coumadin chads score >4 with pacemaker placement-[employer] 2013, nonischemic cardiomyopathy on cath 2010, MI in year 2000 BPH and chronic urinary retention, prior bleeding from varicose veins, transaminitis, bipolar, obesity, OSA on CPAP, sundowning and cognitive decline, gout, hyperlipidemia, ruptured sigmoid diverticulitis status post bowel resection 04/22/2006-Follows with [facility] in outpatient setting Dr. Guinevere Scarlet had upper endoscopy performed--showed erosive esophagitis   Recent admission 2/18-2/19 acute/chronic respiratory failure discharged home Recent admission 2/22-2/24 2019 Acute/chronic systolic HF EF 30-16% 0/10/93-[id number] noted and felt to be more 40-45%: Felt to need possible pulmonary referral-had been admitted for scrotal edema as well as overall swelling  Readmit 3/28 chest pain dyspnea on exertion hemoglobin 7 in a setting of taking NSAIDs and BC powder as a substitute for chronic pain--note recently ran out of Eliquis  Assessment & Plan:   Hypovolemic shock on admission-likely secondary to blood loss 7-given LR 100 cc/h kept n.p.o., started IV PPI gtt., 2 large-bore IV -blood pressures have resolved now systolic's well above 427  Anemia from upper GI bleed-Hb 8.7--7--1 unit--7.2 -Giving 2 more units of blood and will recheck in a.m. Count -EGD 3/30 angiodysplasia status post fulguration, hiatal hernia-no biopsies done -Hemoglobin up 7.2-->8.5  Chest pain initially felt?  Demand ischemia as per cardiology-prior exam by cardiology more epigastric pressure and pain.   -Holding Aldactone 25, Lasix 80 twice daily for now,  -[+] 1.6 L with 100cc/h fluids-->50cc/h 3/31 -lasix given  by cardiology 3/31 Troponin peak 2.5 EKG repeat this a.m. shows RBB, junctional rhythm -as per cardiology further workup --PTA nitroglycerin has been held  Acute/chronic renal failure BUN/creatinine baseline 2/24 = 49/1.48--149/3.6 -BUN/creatinine now down to 107/2.0--->6 6/1.3 -Secondary to upper GI bleed -considering diuretic resumption in am  OSA on CPAP-if patient able to use will continue the same  BPH-would continue Flomax 0.4 as well as Avodart 0.5  Gout-was taking allopurinol 200 daily, colchicine 0.6 as needed which have been held -we will need refills of tramadol on discharge  Possible dementia-continue Lexapro 5 at bedtime, Remeron 15 at bedtime   DVT prophylaxis: SCD code Status:   Presumed full code   family Communication:   Long discussion with wife on phone --discussed r/b of anticoagulation-in OP had lowered dose of AC as patient had some anemia.  Will defer to GI when can safely resume disposition Plan: Inpatient transsfer to tele   Consultants:   Cardiology  Gastroenterology  Procedures:   None yet  Antimicrobials:   None  Subjective:   Awake alert more clear in nad No cp no fever no sob Overall looks improved  Objective: Vitals:   11/05/17 2040 11/06/17 0031 11/06/17 0431 11/06/17 0510  BP: 106/62 122/70 116/64   Pulse: (!) 59 61 62   Resp: 19 16 12    Temp: 98 F (36.7 C) 98 F (36.7 C) 98.1 F (36.7 C) 98 F (36.7 C)  TempSrc: Oral  Oral Oral  SpO2: 100% 100% 100%   Weight:      Height:        Intake/Output Summary (Last 24 hours) at 11/06/2017 1113 Last data filed at 11/06/2017 0700 Gross per 24 hour  Intake 2265 ml  Output 1770 ml  Net 495 ml   Filed Weights   11/04/17 1919  Weight: 84.6 kg (186 lb 8.2 oz)    Examination:   oriented this morning EOMI NCAT no JVD N3-I1 holosystolic murmur Chest is clinically clear without added sound, no rales Epigastric tenderness is about the same No edema in le's Neurologically  grossly intact and moves limbs equally  Data Reviewed: I have personally reviewed following labs and imaging studies  CBC: Recent Labs  Lab 11/03/17 1153 11/04/17 0458 11/04/17 1139 11/06/17 0416  WBC 2.7* 5.4  --  3.9*  NEUTROABS  --   --   --  2.9  HGB 7.0* 8.0* 7.2* 8.5*  HCT 20.8* 24.9* 21.9* 26.0*  MCV 110.1* 106.0*  --  102.8*  PLT 109* 108*  --  79*   Basic Metabolic Panel: Recent Labs  Lab 11/03/17 1153 11/03/17 1344 11/04/17 1139 11/05/17 0354 11/06/17 0416  NA 134*  --  139 139 139  K 4.7  --  4.0 4.1 3.3*  CL 94*  --  101 102 101  CO2 27  --  24 27 31   GLUCOSE 108*  --  70 94 119*  BUN 149*  --  113* 107* 66*  CREATININE 3.68*  --  2.18* 2.03* 1.38*  CALCIUM 9.0  --  8.6* 8.7* 8.5*  MG  --  2.2  --   --   --    GFR: Estimated Creatinine Clearance: 41.4 mL/min (A) (by C-G formula based on SCr of 1.38 mg/dL (H)). Liver Function Tests: Recent Labs  Lab 11/06/17 0416  AST 35  ALT 19  ALKPHOS 46  BILITOT 1.7*  PROT 5.3*  ALBUMIN 2.8*   No results for input(s): LIPASE, AMYLASE in the last 168 hours. No results for input(s): AMMONIA in the last 168 hours. Coagulation Profile: No results for input(s): INR, PROTIME in the last 168 hours. Cardiac Enzymes:  Radiology Studies: Reviewed images personally in health database   Scheduled Meds: . allopurinol  200 mg Oral Daily  . diphenhydrAMINE  25 mg Oral Once  . dutasteride  0.5 mg Oral Daily  . escitalopram  5 mg Oral QHS  . mouth rinse  15 mL Mouth Rinse BID  . mirtazapine  15 mg Oral QHS  . rOPINIRole  3 mg Oral QHS  . sodium chloride flush  10-40 mL Intracatheter Q12H  . tamsulosin  0.4 mg Oral QPC breakfast   Continuous Infusions: . sodium chloride    . lactated ringers 100 mL/hr at 11/06/17 1020  . pantoprozole (PROTONIX) infusion 8 mg/hr (11/06/17 0439)     LOS: 3 days    Time : 25 minutes  Verneita Griffes, MD [facility]  If 7PM-7AM, please contact  night-coverage [email or web address] Password [email or web address] 11/06/2017, 11:13 AM

## 2017-11-06 NOTE — Progress Notes (Signed)
SATURATION QUALIFICATIONS: (This note is used to comply with regulatory documentation for home oxygen)  Patient Saturations on Room Air at Rest = 91%  Patient Saturations on Room Air while Ambulating = 94-99%   Please briefly explain why patient needs home oxygen: Not needed

## 2017-11-07 ENCOUNTER — Institutional Professional Consult (permissible substitution): Payer: Self-pay | Admitting: Pulmonary Disease

## 2017-11-07 LAB — RENAL FUNCTION PANEL
ANION GAP: 7 (ref 5–15)
Albumin: 2.7 g/dL — ABNORMAL LOW (ref 3.5–5.0)
BUN: 44 mg/dL — ABNORMAL HIGH (ref 6–20)
CHLORIDE: 102 mmol/L (ref 101–111)
CO2: 30 mmol/L (ref 22–32)
Calcium: 8.4 mg/dL — ABNORMAL LOW (ref 8.9–10.3)
Creatinine, Ser: 1.19 mg/dL (ref 0.61–1.24)
GFR calc non Af Amer: 54 mL/min — ABNORMAL LOW (ref >60)
Glucose, Bld: 107 mg/dL — ABNORMAL HIGH (ref 65–99)
POTASSIUM: 3.6 mmol/L (ref 3.5–5.1)
Phosphorus: 2.3 mg/dL — ABNORMAL LOW (ref 2.5–4.6)
Sodium: 139 mmol/L (ref 135–145)

## 2017-11-07 LAB — CBC WITH DIFFERENTIAL/PLATELET
BASOS PCT: 0 %
Basophils Absolute: 0 10*3/uL (ref 0.0–0.1)
EOS PCT: 3 %
Eosinophils Absolute: 0.1 10*3/uL (ref 0.0–0.7)
HCT: 25.5 % — ABNORMAL LOW (ref 39.0–52.0)
Hemoglobin: 8.4 g/dL — ABNORMAL LOW (ref 13.0–17.0)
LYMPHS PCT: 29 %
Lymphs Abs: 1 10*3/uL (ref 0.7–4.0)
MCH: 34.6 pg — AB (ref 26.0–34.0)
MCHC: 32.9 g/dL (ref 30.0–36.0)
MCV: 104.9 fL — AB (ref 78.0–100.0)
Monocytes Absolute: 0.3 10*3/uL (ref 0.1–1.0)
Monocytes Relative: 9 %
NEUTROS PCT: 59 %
Neutro Abs: 1.9 10*3/uL (ref 1.7–7.7)
Platelets: 80 10*3/uL — ABNORMAL LOW (ref 150–400)
RBC: 2.43 MIL/uL — ABNORMAL LOW (ref 4.22–5.81)
RDW: 21.3 % — ABNORMAL HIGH (ref 11.5–15.5)
WBC: 3.3 10*3/uL — ABNORMAL LOW (ref 4.0–10.5)

## 2017-11-07 LAB — GLUCOSE, CAPILLARY
GLUCOSE-CAPILLARY: 106 mg/dL — AB (ref 65–99)
GLUCOSE-CAPILLARY: 104 mg/dL — AB (ref 65–99)
Glucose-Capillary: 97 mg/dL (ref 65–99)

## 2017-11-07 MED ORDER — FUROSEMIDE 40 MG PO TABS
40.0000 mg | ORAL_TABLET | Freq: Every day | ORAL | Status: DC
  Administered 2017-11-07 – 2017-11-08 (×2): 40 mg via ORAL
  Filled 2017-11-07 (×2): qty 1

## 2017-11-07 NOTE — Care Management Note (Addendum)
Case Management Note  Patient Details  Name: [name] MRN: [id number] Date of Birth: 03/04/1932  Subjective/Objective:     Admitted with GI Bleed,  Hx of  a pacemaker, afib on apixaban, CHF.From home wife.Pt states wife has a  family friend assisting her with her care and will be available to him if needed at d/c. States not interested in home health services 2/2 to freq. visits to son who lives on [address]. Pt owns a cane,walker,motorized wheelchair. Pt with 3 hospital admits within the past 6 month.              Karlos Scadden (Spouse) Lavone Orn (Daughter)    [phone]     PCP: Domenick Gong  Action/Plan:  NCM called wife to discuss d/c plan, call unsuccessful, voice message left.  Transition to home when medically ready... CM following for transitional care needs.  Expected Discharge Date:   11/07/2017            Expected Discharge Plan:  [place]  In-House Referral:  [facility]  Discharge planning Services  CM Consult  Post Acute Care Choice:    Choice offered to:  Patient  DME Arranged:   N/A DME Agency:   N/A  HH Arranged:   N/A HH Agency:   N/A  Status of Service:    If discussed at [place] of Stay Meetings, dates discussed:    Additional Comments:  Sharin Mons, RN 11/07/2017, 10:57 AM

## 2017-11-07 NOTE — Progress Notes (Signed)
Hospitalist progress note   [name]  SFK:[id number] DOB: Aug 02, 1932 DOA: 11/03/2017 PCP: [name], MD   Specialists:  Cardiology consulted in the emergency room Gastroenterology consulted by emergency room   Brief Narrative:  84 year old male, atrial fibrillation on Coumadin chads score >4 with pacemaker placement-[employer] 2013, nonischemic cardiomyopathy on cath 2010, MI in year 2000 BPH and chronic urinary retention, prior bleeding from varicose veins, transaminitis, bipolar, obesity, OSA on CPAP, sundowning and cognitive decline, gout, hyperlipidemia, ruptured sigmoid diverticulitis status post bowel resection 04/22/2006-Follows with [facility] in outpatient setting Dr. Guinevere Scarlet had upper endoscopy performed--showed erosive esophagitis   Recent admission 2/18-2/19 acute/chronic respiratory failure discharged home Recent admission 2/22-2/24 2019 Acute/chronic systolic HF EF 17-49% 4/49/67-[id number] noted and felt to be more 40-45%: Felt to need possible pulmonary referral-had been admitted for scrotal edema as well as overall swelling  Readmit 3/28 chest pain dyspnea on exertion hemoglobin 7 in a setting of taking NSAIDs and BC powder as a substitute for chronic pain--note recently ran out of Eliquis  Assessment & Plan:   Hypovolemic shock on admission-likely secondary to blood loss 7-given LR 100 cc/h kept n.p.o., started IV PPI gtt., 2 large-bore IV -blood pressures have resolved now systolic's well above 935  Anemia from upper GI bleed-Hb 8.7--7--1 unit--7.2 -Giving 2 more units of blood and will recheck in a.m. Count -EGD 3/30 angiodysplasia status post fulguration, hiatal hernia-no biopsies done -Hemoglobin up 7.2-->8.5 --await GI input prior to advancing feed , when to resume safely AC given Endo findings etc  Chest pain initially felt?  Demand ischemia as per cardiology-prior exam by cardiology more epigastric pressure and pain.   -Holding Aldactone  25, Lasix 80 twice daily for now,  -[+] 1.6 L with 100cc/h fluids-->50cc/h 3/31 -lasix given by cardiology 3/31 Troponin peak 2.5 EKG repeat this a.m. shows RBB, junctional rhythm -as per cardiology further workup -resuming home lasix 40 qd 4/1 --PTA nitroglycerin has been held  Acute/chronic renal failure BUN/creatinine baseline 2/24 = 49/1.48--149/3.6 -BUN/creatinine now down to 107/2.0--- >44/1.19 -Secondary to upper GI bleed  OSA on CPAP-if patient able to use will continue the same  BPH-would continue Flomax 0.4 as well as Avodart 0.5  Gout-was taking allopurinol 200 daily, colchicine 0.6 as needed which have been held -we will need refills of tramadol on discharge  Possible dementia-continue Lexapro 5 at bedtime, Remeron 15 at bedtime   DVT prophylaxis: SCD code Status:   Presumed full code   family Communication:   No family disposition Plan: Inpatient transsfer to tele--await furthe rinput from GI and probable d/c when diet advanced likely am   Consultants:   Cardiology  Gastroenterology  Procedures:   None yet  Antimicrobials:   None  Subjective:  Overall improved No sob no cp no n  Objective: Vitals:   11/07/17 0432 11/07/17 0628 11/07/17 1300 11/07/17 1546  BP:  106/60  (!) 120/105  Pulse:  (!) 39  60  Resp:  17  20  Temp: 97.8 F (36.6 C)  97.8 F (36.6 C) 97.7 F (36.5 C)  TempSrc: Oral  Oral Oral  SpO2:  97%  95%  Weight:      Height:        Intake/Output Summary (Last 24 hours) at 11/07/2017 1711 Last data filed at 11/07/2017 1300 Gross per 24 hour  Intake 2733.33 ml  Output 350 ml  Net 2383.33 ml   Filed Weights   11/04/17 1919  Weight: 84.6 kg (186 lb 8.2 oz)  Examination:   oriented this morning EOMI NCAT no JVD O1-L5 holosystolic murmur Chest is clinically clear without added sound, no rales Epigastric tenderness improve dbut still + No edema in le's Neurologically grossly intact and moves limbs equally  Data Reviewed:  I have personally reviewed following labs and imaging studies  CBC: Recent Labs  Lab 11/03/17 1153 11/04/17 0458 11/04/17 1139 11/06/17 0416 11/07/17 0507  WBC 2.7* 5.4  --  3.9* 3.3*  NEUTROABS  --   --   --  2.9 1.9  HGB 7.0* 8.0* 7.2* 8.5* 8.4*  HCT 20.8* 24.9* 21.9* 26.0* 25.5*  MCV 110.1* 106.0*  --  102.8* 104.9*  PLT 109* 108*  --  79* 80*   Basic Metabolic Panel: Recent Labs  Lab 11/03/17 1153 11/03/17 1344 11/04/17 1139 11/05/17 0354 11/06/17 0416 11/07/17 0507  NA 134*  --  139 139 139 139  K 4.7  --  4.0 4.1 3.3* 3.6  CL 94*  --  101 102 101 102  CO2 27  --  24 27 31 30   GLUCOSE 108*  --  70 94 119* 107*  BUN 149*  --  113* 107* 66* 44*  CREATININE 3.68*  --  2.18* 2.03* 1.38* 1.19  CALCIUM 9.0  --  8.6* 8.7* 8.5* 8.4*  MG  --  2.2  --   --   --   --   PHOS  --   --   --   --   --  2.3*   GFR: Estimated Creatinine Clearance: 48 mL/min (by C-G formula based on SCr of 1.19 mg/dL). Liver Function Tests: Recent Labs  Lab 11/06/17 0416 11/07/17 0507  AST 35  --   ALT 19  --   ALKPHOS 46  --   BILITOT 1.7*  --   PROT 5.3*  --   ALBUMIN 2.8* 2.7*   No results for input(s): LIPASE, AMYLASE in the last 168 hours. No results for input(s): AMMONIA in the last 168 hours. Coagulation Profile: No results for input(s): INR, PROTIME in the last 168 hours. Cardiac Enzymes:  Radiology Studies: Reviewed images personally in health database   Scheduled Meds: . allopurinol  200 mg Oral Daily  . diphenhydrAMINE  25 mg Oral Once  . dutasteride  0.5 mg Oral Daily  . escitalopram  5 mg Oral QHS  . mouth rinse  15 mL Mouth Rinse BID  . mirtazapine  15 mg Oral QHS  . pantoprazole (PROTONIX) IV  40 mg Intravenous Q12H  . rOPINIRole  3 mg Oral QHS  . sodium chloride flush  10-40 mL Intracatheter Q12H  . tamsulosin  0.4 mg Oral QPC breakfast   Continuous Infusions: . sodium chloride    . lactated ringers 50 mL/hr at 11/07/17 0513     LOS: 4 days    Time :  15 minutes  Verneita Griffes, MD [facility]  If 7PM-7AM, please contact night-coverage [email or web address] Password [email or web address] 11/07/2017, 5:11 PM

## 2017-11-07 NOTE — Progress Notes (Signed)
Pt stated he doesn't want to wear CPAP. Will continue to monitor pt. Ranelle Oyster, RN

## 2017-11-07 NOTE — Progress Notes (Signed)
Pharmacist Heart Failure Core Measure Documentation  Assessment: [name] had an echo on 09/21/2017  ECHO:09/21/2017 - Left ventricle: The cavity size was moderately dilated. There was mild concentric hypertrophy. Systolic function was severely reduced. The estimated ejection fraction was in the range of 25% to 30%. The study is not technically sufficient to allow evaluation of LV diastolic function.(Dr C feels EF 40-45%, close to previous baseline) - Mitral valve: There was mild regurgitation. - Left atrium: The atrium was moderately dilated. - Right ventricle: The cavity size was moderately dilated. - Right atrium: The atrium was moderately dilated. - Atrial septum: No defect or patent foramen ovale was identified. - Tricuspid valve: There was severe regurgitation. - Pulmonary arteries: PA peak pressure: 111 mm Hg (S).    Rationale: Heart failure patients with left ventricular systolic dysfunction (LVSD) and an EF < 40% should be prescribed an angiotensin converting enzyme inhibitor (ACEI) or angiotensin receptor blocker (ARB) at discharge unless a contraindication is documented in the medical record.  This patient is not currently on an ACEI or ARB for HF.  This note is being placed in the record in order to provide documentation that a contraindication to the use of these agents is present for this encounter.  ACE Inhibitor or Angiotensin Receptor Blocker is contraindicated (specify all that apply)  []   ACEI allergy AND ARB allergy []   Angioedema []   Moderate or severe aortic stenosis []   Hyperkalemia [x]   Hypotension []   Renal artery stenosis [x]   Worsening renal function, preexisting renal disease or dysfunction   Varney Daily Mitchel Delduca 11/07/2017 10:25 AM

## 2017-11-07 NOTE — Consult Note (Signed)
   [facility] CM Inpatient Consult   11/07/2017  [name] May 20, 1932 [id number]  Referral receive for potential post hospital follow up from inpatient RNCM, Levada Dy. Patient has a history with [facility] Care Management in the Medicare ACO with EMMI HF calls.  Patient was getting frustrated with too many calls.  States, "I would mind a call here and there sometimes." Patient did accept a brochure, 24 hour nurse line magnet with contact information.  Patient states, "I am always out traveling to [facility] and trips here and there.  I have a caregiver for my wife and I will get enough exercise keeping a check on my wife." Wife made aware of brochure via phone while talking with inpatient RNCM.  For questions, please contact:  Natividad Brood, RN BSN [facility] Liaison  [phone] business mobile phone Toll free office [phone]

## 2017-11-07 NOTE — Care Management Important Message (Signed)
Important Message  Patient Details  Name: [name] MRN: [id number] Date of Birth: 11-Mar-1932   Medicare Important Message Given:       Orbie Pyo 11/07/2017, 3:09 PM

## 2017-11-08 LAB — GLUCOSE, CAPILLARY
GLUCOSE-CAPILLARY: 103 mg/dL — AB (ref 65–99)
GLUCOSE-CAPILLARY: 113 mg/dL — AB (ref 65–99)
GLUCOSE-CAPILLARY: 122 mg/dL — AB (ref 65–99)
Glucose-Capillary: 113 mg/dL — ABNORMAL HIGH (ref 65–99)

## 2017-11-08 LAB — CBC WITH DIFFERENTIAL/PLATELET
Basophils Absolute: 0 10*3/uL (ref 0.0–0.1)
Basophils Relative: 0 %
EOS ABS: 0.1 10*3/uL (ref 0.0–0.7)
Eosinophils Relative: 3 %
HCT: 25.8 % — ABNORMAL LOW (ref 39.0–52.0)
HEMOGLOBIN: 8.6 g/dL — AB (ref 13.0–17.0)
LYMPHS ABS: 0.9 10*3/uL (ref 0.7–4.0)
Lymphocytes Relative: 30 %
MCH: 35.4 pg — AB (ref 26.0–34.0)
MCHC: 33.3 g/dL (ref 30.0–36.0)
MCV: 106.2 fL — ABNORMAL HIGH (ref 78.0–100.0)
Monocytes Absolute: 0.3 10*3/uL (ref 0.1–1.0)
Monocytes Relative: 10 %
NEUTROS PCT: 57 %
Neutro Abs: 1.7 10*3/uL (ref 1.7–7.7)
Platelets: 88 10*3/uL — ABNORMAL LOW (ref 150–400)
RBC: 2.43 MIL/uL — AB (ref 4.22–5.81)
RDW: 20.5 % — ABNORMAL HIGH (ref 11.5–15.5)
WBC: 3.1 10*3/uL — AB (ref 4.0–10.5)

## 2017-11-08 LAB — RENAL FUNCTION PANEL
Albumin: 2.9 g/dL — ABNORMAL LOW (ref 3.5–5.0)
Anion gap: 9 (ref 5–15)
BUN: 37 mg/dL — ABNORMAL HIGH (ref 6–20)
CO2: 29 mmol/L (ref 22–32)
CREATININE: 1.11 mg/dL (ref 0.61–1.24)
Calcium: 8.5 mg/dL — ABNORMAL LOW (ref 8.9–10.3)
Chloride: 101 mmol/L (ref 101–111)
GFR, EST NON AFRICAN AMERICAN: 59 mL/min — AB (ref >60)
Glucose, Bld: 94 mg/dL (ref 65–99)
POTASSIUM: 3.9 mmol/L (ref 3.5–5.1)
Phosphorus: 2.8 mg/dL (ref 2.5–4.6)
Sodium: 139 mmol/L (ref 135–145)

## 2017-11-08 MED ORDER — PANTOPRAZOLE SODIUM 40 MG PO TBEC: 40.0000 mg | DELAYED_RELEASE_TABLET | Freq: Every day | ORAL | 0 refills | Status: DC

## 2017-11-08 MED ORDER — FUROSEMIDE 40 MG PO TABS: 40.0000 mg | ORAL_TABLET | Freq: Every day | ORAL | 0 refills | Status: DC

## 2017-11-08 MED ORDER — APIXABAN 5 MG PO TABS
5.0000 mg | ORAL_TABLET | Freq: Two times a day (BID) | ORAL | Status: DC
Start: 2017-11-10 — End: 2018-01-18

## 2017-11-08 NOTE — Progress Notes (Signed)
GI Discharge/Sign-Off Note  Subjective: Patient is now 3 days status post EGD with ablation of AVMs in the stomach and duodenum by Dr. Schooler.  He has had no further gross bleeding.  He has previously been on Eliquis for atrial fibrillation apparently had also been taking some NSAIDs.  Principal Problem:   GI bleed Active Problems:   Atrial fibrillation, persistent, slow VR   Chronic anticoagulation   OSA on CPAP   Other secondary pulmonary hypertension (HCC)   Chronic systolic heart failure (HCC)   Chronic atrial fibrillation (HCC)   Acute upper GI bleeding   CHF (congestive heart failure) (HCC)   Acute renal failure superimposed on stage 3 chronic kidney disease (HCC)   Chest pain   Results for orders placed or performed during the hospital encounter of 11/03/17 (from the past 72 hour(s))  MRSA PCR Screening     Status: None   Collection Time: 11/05/17 11:04 AM  Result Value Ref Range   MRSA by PCR NEGATIVE NEGATIVE    Comment:        The GeneXpert MRSA Assay (FDA approved for NASAL specimens only), is one component of a comprehensive MRSA colonization surveillance program. It is not intended to diagnose MRSA infection nor to guide or monitor treatment for MRSA infections. Performed at [facility], [address] [place], [place] [address]   CBC with Differential/Platelet     Status: Abnormal   Collection Time: 11/06/17  4:16 AM  Result Value Ref Range   WBC 3.9 (L) 4.0 - 10.5 K/uL   RBC 2.53 (L) 4.22 - 5.81 MIL/uL   Hemoglobin 8.5 (L) 13.0 - 17.0 g/dL   HCT 26.0 (L) 39.0 - 52.0 %   MCV 102.8 (H) 78.0 - 100.0 fL   MCH 33.6 26.0 - 34.0 pg   MCHC 32.7 30.0 - 36.0 g/dL   RDW 22.2 (H) 11.5 - 15.5 %   Platelets 79 (L) 150 - 400 K/uL    Comment: CONSISTENT WITH PREVIOUS RESULT   Neutrophils Relative % 73 %   Lymphocytes Relative 18 %   Monocytes Relative 8 %   Eosinophils Relative 1 %   Basophils Relative 0 %   Neutro Abs 2.9 1.7 - 7.7 K/uL   Lymphs Abs  0.7 0.7 - 4.0 K/uL   Monocytes Absolute 0.3 0.1 - 1.0 K/uL   Eosinophils Absolute 0.0 0.0 - 0.7 K/uL   Basophils Absolute 0.0 0.0 - 0.1 K/uL   RBC Morphology POLYCHROMASIA PRESENT     Comment: Performed at [facility], [address] [place], [place] [address]  Comprehensive metabolic panel     Status: Abnormal   Collection Time: 11/06/17  4:16 AM  Result Value Ref Range   Sodium 139 135 - 145 mmol/L   Potassium 3.3 (L) 3.5 - 5.1 mmol/L   Chloride 101 101 - 111 mmol/L   CO2 31 22 - 32 mmol/L   Glucose, Bld 119 (H) 65 - 99 mg/dL   BUN 66 (H) 6 - 20 mg/dL   Creatinine, Ser 1.38 (H) 0.61 - 1.24 mg/dL   Calcium 8.5 (L) 8.9 - 10.3 mg/dL   Total Protein 5.3 (L) 6.5 - 8.1 g/dL   Albumin 2.8 (L) 3.5 - 5.0 g/dL   AST 35 15 - 41 U/L   ALT 19 17 - 63 U/L   Alkaline Phosphatase 46 38 - 126 U/L   Total Bilirubin 1.7 (H) 0.3 - 1.2 mg/dL   GFR calc non Af   Amer 45 (L) >60 mL/min   GFR calc Af Amer 53 (L) >60 mL/min    Comment: (NOTE) The eGFR has been calculated using the CKD EPI equation. This calculation has not been validated in all clinical situations. eGFR's persistently <60 mL/min signify possible Chronic Kidney Disease.    Anion gap 7 5 - 15    Comment: Performed at [facility], [address] [place], [place] [address]  Renal function panel     Status: Abnormal   Collection Time: 11/07/17  5:07 AM  Result Value Ref Range   Sodium 139 135 - 145 mmol/L   Potassium 3.6 3.5 - 5.1 mmol/L   Chloride 102 101 - 111 mmol/L   CO2 30 22 - 32 mmol/L   Glucose, Bld 107 (H) 65 - 99 mg/dL   BUN 44 (H) 6 - 20 mg/dL   Creatinine, Ser 1.19 0.61 - 1.24 mg/dL   Calcium 8.4 (L) 8.9 - 10.3 mg/dL   Phosphorus 2.3 (L) 2.5 - 4.6 mg/dL   Albumin 2.7 (L) 3.5 - 5.0 g/dL   GFR calc non Af Amer 54 (L) >60 mL/min   GFR calc Af Amer >60 >60 mL/min    Comment: (NOTE) The eGFR has been calculated using the CKD EPI equation. This calculation has not been validated in all clinical  situations. eGFR's persistently <60 mL/min signify possible Chronic Kidney Disease.    Anion gap 7 5 - 15    Comment: Performed at [facility], [address] [place], [place] [address]  CBC with Differential/Platelet     Status: Abnormal   Collection Time: 11/07/17  5:07 AM  Result Value Ref Range   WBC 3.3 (L) 4.0 - 10.5 K/uL   RBC 2.43 (L) 4.22 - 5.81 MIL/uL   Hemoglobin 8.4 (L) 13.0 - 17.0 g/dL   HCT 25.5 (L) 39.0 - 52.0 %   MCV 104.9 (H) 78.0 - 100.0 fL   MCH 34.6 (H) 26.0 - 34.0 pg   MCHC 32.9 30.0 - 36.0 g/dL   RDW 21.3 (H) 11.5 - 15.5 %   Platelets 80 (L) 150 - 400 K/uL    Comment: CONSISTENT WITH PREVIOUS RESULT   Neutrophils Relative % 59 %   Lymphocytes Relative 29 %   Monocytes Relative 9 %   Eosinophils Relative 3 %   Basophils Relative 0 %   Neutro Abs 1.9 1.7 - 7.7 K/uL   Lymphs Abs 1.0 0.7 - 4.0 K/uL   Monocytes Absolute 0.3 0.1 - 1.0 K/uL   Eosinophils Absolute 0.1 0.0 - 0.7 K/uL   Basophils Absolute 0.0 0.0 - 0.1 K/uL   RBC Morphology ELLIPTOCYTES     Comment: POLYCHROMASIA PRESENT Performed at [facility], [address] [place], [place] [address]   Glucose, capillary     Status: Abnormal   Collection Time: 11/07/17 12:28 PM  Result Value Ref Range   Glucose-Capillary 106 (H) 65 - 99 mg/dL  Glucose, capillary     Status: None   Collection Time: 11/07/17  3:46 PM  Result Value Ref Range   Glucose-Capillary 97 65 - 99 mg/dL  Glucose, capillary     Status: Abnormal   Collection Time: 11/07/17  8:12 PM  Result Value Ref Range   Glucose-Capillary 104 (H) 65 - 99 mg/dL  Glucose, capillary     Status: Abnormal   Collection Time: 11/08/17 12:05 AM  Result Value Ref Range   Glucose-Capillary 113 (H) 65 - 99 mg/dL    Glucose, capillary     Status: Abnormal   Collection Time: 11/08/17  4:18 AM  Result Value Ref Range   Glucose-Capillary 103 (H) 65 - 99 mg/dL  Renal function panel     Status: Abnormal   Collection Time: 11/08/17  5:00 AM   Result Value Ref Range   Sodium 139 135 - 145 mmol/L   Potassium 3.9 3.5 - 5.1 mmol/L   Chloride 101 101 - 111 mmol/L   CO2 29 22 - 32 mmol/L   Glucose, Bld 94 65 - 99 mg/dL   BUN 37 (H) 6 - 20 mg/dL   Creatinine, Ser 1.11 0.61 - 1.24 mg/dL   Calcium 8.5 (L) 8.9 - 10.3 mg/dL   Phosphorus 2.8 2.5 - 4.6 mg/dL   Albumin 2.9 (L) 3.5 - 5.0 g/dL   GFR calc non Af Amer 59 (L) >60 mL/min   GFR calc Af Amer >60 >60 mL/min    Comment: (NOTE) The eGFR has been calculated using the CKD EPI equation. This calculation has not been validated in all clinical situations. eGFR's persistently <60 mL/min signify possible Chronic Kidney Disease.    Anion gap 9 5 - 15    Comment: Performed at [facility] [address], [place], [place] [address]  CBC with Differential/Platelet     Status: Abnormal   Collection Time: 11/08/17  5:00 AM  Result Value Ref Range   WBC 3.1 (L) 4.0 - 10.5 K/uL   RBC 2.43 (L) 4.22 - 5.81 MIL/uL   Hemoglobin 8.6 (L) 13.0 - 17.0 g/dL   HCT 25.8 (L) 39.0 - 52.0 %   MCV 106.2 (H) 78.0 - 100.0 fL   MCH 35.4 (H) 26.0 - 34.0 pg   MCHC 33.3 30.0 - 36.0 g/dL   RDW 20.5 (H) 11.5 - 15.5 %   Platelets 88 (L) 150 - 400 K/uL    Comment: CONSISTENT WITH PREVIOUS RESULT   Neutrophils Relative % 57 %   Neutro Abs 1.7 1.7 - 7.7 K/uL   Lymphocytes Relative 30 %   Lymphs Abs 0.9 0.7 - 4.0 K/uL   Monocytes Relative 10 %   Monocytes Absolute 0.3 0.1 - 1.0 K/uL   Eosinophils Relative 3 %   Eosinophils Absolute 0.1 0.0 - 0.7 K/uL   Basophils Relative 0 %   Basophils Absolute 0.0 0.0 - 0.1 K/uL    Comment: Performed at [facility], [place] [address], [place], [place] [address]  Glucose, capillary     Status: Abnormal   Collection Time: 11/08/17  7:53 AM  Result Value Ref Range   Glucose-Capillary 122 (H) 65 - 99 mg/dL    No results found.  _0 @  GI DISCHARGE PLANNING:  Diet: Cardiac healthy diet  Anticoagulation/Antiplatelet Rx Suggestions: Would hold  the Eliquis for 2 more days.  This would allow 5 days off of the Eliquis after ablation of the AVMs.  Discussed with patient and wife that he is at risk for bleeding and should avoid all NSAIDs.  GI Medications: Discharge on omeprazole 20 mg twice daily  Labs/Procedures Ordered:  GI FOLLOW UP:  Call [phone] to make appointment.   Doctor: Dr. Watt Climes  Time: 2-3 weeks   Laurence Spates, MD   Pager [phone] If no answer or after hours call [phone]

## 2017-11-08 NOTE — Discharge Summary (Signed)
Physician Discharge Summary  [name] XBD:[id number] DOB: Dec 12, 1931 DOA: 11/03/2017  PCP: [name], MD  Admit date: 11/03/2017 Discharge date: 11/08/2017  Time spent: 35 minutes  Recommendations for Outpatient Follow-up:  1. Recommend outpatient CBC as well as basic metabolic panel 2. Resuming Eliquis 11/10/2017, changed Protonix from 40 once to twice daily, Changed Lasix 80 twice daily-->40 once daily, discontinued Aldactone might need adjustment as an outpatient depending on labs and fluid status]   Discharge Diagnoses:  Principal Problem:   GI bleed Active Problems:   Atrial fibrillation, persistent, slow VR   Chronic anticoagulation   OSA on CPAP   Other secondary pulmonary hypertension (HCC)   Chronic systolic heart failure (HCC)   Chronic atrial fibrillation (HCC)   Acute upper GI bleeding   CHF (congestive heart failure) (HCC)   Acute renal failure superimposed on stage 3 chronic kidney disease (HCC)   Chest pain   Discharge Condition: Improved  Diet recommendation: Low-salt heart healthy  Filed Weights   11/04/17 1919 11/08/17 0420  Weight: 84.6 kg (186 lb 8.2 oz) 90.1 kg (198 lb 9.6 oz)    History of present illness:  84 year old male, atrial fibrillation on Coumadin chads score >4 with pacemaker placement-[employer] 2013, nonischemic cardiomyopathy on cath 2010, MI in year 2000 BPH and chronic urinary retention, prior bleeding from varicose veins, transaminitis, bipolar, obesity, OSA on CPAP, sundowning and cognitive decline, gout, hyperlipidemia, ruptured sigmoid diverticulitis status post bowel resection 04/22/2006-Follows with [facility] in outpatient setting Dr. Guinevere Scarlet had upper endoscopy performed--showed erosive esophagitis   Recent admission 2/18-2/19 acute/chronic respiratory failure discharged home Recent admission 2/22-2/24 2019 Acute/chronic systolic HF EF 83-41% 9/62/22-[id number] noted and felt to be more 40-45%: Felt to need  possible pulmonary referral-had been admitted for scrotal edema as well as overall swelling  Readmit 3/28 chest pain dyspnea on exertion hemoglobin 7 in a setting of taking NSAIDs and BC powder as a substitute for chronic pain--note recently ran out of [facility] Course:   Hypovolemic shock on admission-likely secondary to blood loss 7-given LR 100 cc/h kept n.p.o., started IV PPI gtt., 2 large-bore IV -blood pressures have resolved now systolic's well above 740 -Meds were resumed on discharge with adjustments  Anemia from upper GI bleed-Hb 8.7--7--1 unit--7.2 -Giving 2 more units of blood and will recheck in a.m. Count -EGD 3/30 angiodysplasia status post fulguration, hiatal hernia-no biopsies done -Hemoglobin up 7.2-->8.5 and stabilized on discharge 4/2 at 8.5 --Given soft diet for the next 3-4 days and careful resumption of Eliquis as per guidance from gastroenterology on 11/10/2017  Chest pain initially felt?  Demand ischemia as per cardiology-prior exam by cardiology more epigastric pressure and pain.   -[+] 1.6 L with 100cc/h fluids-->50cc/h 3/31 -lasix given by cardiology 3/31 Troponin peak 2.5 EKG repeat this a.m. shows RBB, junctional rhythm -as per cardiology further workup -resuming home lasix 40 qd 4/1 -Discontinued Aldactone permanently off last needs readdressing as an outpatient with either PCP or--- cardiologist  Acute/chronic renal failure BUN/creatinine baseline 2/24 = 49/1.48--149/3.6 -BUN/creatinine now down to 107/2.0--- >44/1.19 -Secondary to upper GI bleed  OSA on CPAP-if patient able to use will continue the same  BPH-would continue Flomax 0.4 as well as Avodart 0.5  Gout-was taking allopurinol 200 daily, colchicine 0.6 as needed which have been held --States he has tramadol at home and will contact PCP for the same  Possible dementia-continue Lexapro 5 at bedtime, Remeron 15 at bedtime    Procedures:  Multiple  x-rays echocardiogram,  EGD,  Consultations:  gastroenterology  Cardiology  Discharge Exam: Vitals:   11/07/17 2208 11/08/17 0420  BP: 115/64 106/68  Pulse: 61 (!) 59  Resp: 18 18  Temp: 98.2 F (36.8 C) 98 F (36.7 C)  SpO2: 95% 96%    General: Alert pleasant oriented wearing street clothes no distress states still some burning in abdomen has had mainly full liquids up to this point Stool has finally turned brown and is no longer dark Cardiovascular: S1-S2 no murmur telemetry reviewed no arrhythmia Respiratory: Clinically clear no added sound Abdomen soft with some tenderness in epigastrium  Discharge Instructions   Discharge Instructions    Diet - low sodium heart healthy   Complete by:  As directed    Discharge instructions   Complete by:  As directed    You were treated for a stomach bleed DO NOT take eliquis until 11/10/17 Stop Aldactone Take a lower dose of Lasix 40 1 x a day NO more NSAIDS   Increase activity slowly   Complete by:  As directed      Allergies as of 11/08/2017      Reactions   Vicodin [hydrocodone-acetaminophen] Itching   Hydrocodone Itching      Medication List    STOP taking these medications   loratadine 10 MG tablet Commonly known as:  CLARITIN   nitroGLYCERIN 0.4 MG SL tablet Commonly known as:  NITROSTAT   spironolactone 25 MG tablet Commonly known as:  ALDACTONE     TAKE these medications   acetaminophen 650 MG CR tablet Commonly known as:  TYLENOL Take 650 mg by mouth every 8 (eight) hours as needed for pain.   allopurinol 100 MG tablet Commonly known as:  ZYLOPRIM Take 2 tablets (200 mg total) by mouth daily.   apixaban 5 MG Tabs tablet Commonly known as:  ELIQUIS Take 1 tablet (5 mg total) by mouth 2 (two) times daily. Start taking on:  11/10/2017 What changed:  These instructions start on 11/10/2017. If you are unsure what to do until then, ask your doctor or other care provider.   colchicine 0.6 MG tablet Take 0.6 mg by mouth daily as  needed (gout flare). Colcrys   dutasteride 0.5 MG capsule Commonly known as:  AVODART Take 0.5 mg by mouth daily.   escitalopram 5 MG tablet Commonly known as:  LEXAPRO Take 1 tablet (5 mg total) by mouth at bedtime.   feeding supplement (PRO-STAT SUGAR FREE 64) Liqd Take 30 mLs by mouth 2 (two) times daily.   ferrous sulfate 325 (65 FE) MG tablet Take 325 mg by mouth daily with breakfast.   furosemide 40 MG tablet Commonly known as:  LASIX Take 1 tablet (40 mg total) by mouth daily. Start taking on:  11/09/2017 What changed:    how much to take  when to take this  additional instructions  Another medication with the same name was removed. Continue taking this medication, and follow the directions you see here.   hydrOXYzine 25 MG tablet Commonly known as:  ATARAX/VISTARIL TAKE 1 TABLET BY MOUTH EVERY 6 HOURS AS NEEDED FOR ITCHING What changed:  See the new instructions.   mirtazapine 15 MG tablet Commonly known as:  REMERON Take 15 mg by mouth at bedtime.   multivitamin with minerals Tabs tablet Take 1 tablet by mouth daily.   pantoprazole 40 MG tablet Commonly known as:  PROTONIX Take 1 tablet (40 mg total) by mouth daily.   potassium chloride 10  MEQ tablet Commonly known as:  K-DUR Take 1 tablet (10 mEq total) by mouth daily. What changed:  when to take this   PRESERVISION AREDS 2 Caps Take 1 capsule by mouth daily.   rOPINIRole 3 MG tablet Commonly known as:  REQUIP Take 3 mg by mouth at bedtime.   STOOL SOFTENER PO Take 2 tablets by mouth at bedtime.   tamsulosin 0.4 MG Caps capsule Commonly known as:  FLOMAX Take 0.4 mg by mouth daily after breakfast.   traMADol 50 MG tablet Commonly known as:  ULTRAM Take 100 mg by mouth 2 (two) times daily. For pain      Allergies  Allergen Reactions  . Vicodin [Hydrocodone-Acetaminophen] Itching  . Hydrocodone Itching      The results of significant diagnostics from this hospitalization (including  imaging, microbiology, ancillary and laboratory) are listed below for reference.    Significant Diagnostic Studies: Dg Chest 2 View  Result Date: 11/03/2017 CLINICAL DATA:  84 year old male with history of substernal chest pain and shortness of breath yesterday and today. EXAM: CHEST - 2 VIEW COMPARISON:  Chest x-ray 09/30/2017. FINDINGS: There is cephalization of the pulmonary vasculature and slight indistinctness of the interstitial markings suggestive of mild pulmonary edema. No pleural effusions. Mild cardiomegaly. Upper mediastinal contours are within normal limits. Aortic atherosclerosis. Left sided pacemaker with single lead projecting over the expected location of the right ventricular apex. IMPRESSION: 1. The appearance the chest is most compatible with mild congestive heart failure, as above. 2. Aortic atherosclerosis. Electronically Signed   By: Vinnie Langton M.D.   On: 11/03/2017 12:21   Ct Head Wo Contrast  Result Date: 11/03/2017 CLINICAL DATA:  Multiple falls at home, posttraumatic headache, weakness, bruising on face in arms, history of coronary artery disease post MI, non ischemic cardiomyopathy, CHF, stage III chronic kidney disease, hypertension, permanent atrial fibrillation, pulmonary hypertension EXAM: CT HEAD WITHOUT CONTRAST CT CERVICAL SPINE WITHOUT CONTRAST TECHNIQUE: Multidetector CT imaging of the head and cervical spine was performed following the standard protocol without intravenous contrast. Multiplanar CT image reconstructions of the cervical spine were also generated. COMPARISON:  04/05/2017 FINDINGS: CT HEAD FINDINGS Brain: Motion artifacts near vertex. Generalized atrophy. Normal ventricular morphology. No midline shift or mass effect. Small vessel chronic ischemic changes of deep cerebral white matter. No intracranial hemorrhage, mass lesion or evidence acute infarction identified within limitations of motion. Small old lacunar infarct at posterior limb of LEFT  internal capsule. No extra-axial fluid collections. Vascular: Atherosclerotic calcification of internal carotid arteries at skull base Skull: Grossly intact Sinuses/Orbits: Clear Other: N/A CT CERVICAL SPINE FINDINGS Alignment: Imaging degraded by patient motion. Alignment grossly normal. Skull base and vertebrae: Visualized skull base intact. Bones demineralized. Vertebral body heights appear maintained. No definite fracture, subluxation, or bone destruction. Multilevel facet degenerative changes. Soft tissues and spinal canal: Prevertebral soft tissues normal thickness. Disc levels: Mild AP narrowing of spinal canal due to endplate spur formation at C5-C6. Disc space narrowing at C4-C5 through C6-C7. Upper chest: Lung apices clear Other: Atherosclerotic calcifications within the carotid systems. IMPRESSION: Motion artifacts on both CT head and CT cervical spine exams. Atrophy with small vessel chronic ischemic changes of deep cerebral white matter. Tiny old lacunar infarct at posterior limb LEFT internal capsule. No definite acute intracranial abnormalities. Multilevel degenerative disc and facet disease changes of the cervical spine. No definite acute cervical spine abnormalities. Electronically Signed   By: Lavonia Dana M.D.   On: 11/03/2017 18:01   Ct Cervical Spine  Wo Contrast  Result Date: 11/03/2017 CLINICAL DATA:  Multiple falls at home, posttraumatic headache, weakness, bruising on face in arms, history of coronary artery disease post MI, non ischemic cardiomyopathy, CHF, stage III chronic kidney disease, hypertension, permanent atrial fibrillation, pulmonary hypertension EXAM: CT HEAD WITHOUT CONTRAST CT CERVICAL SPINE WITHOUT CONTRAST TECHNIQUE: Multidetector CT imaging of the head and cervical spine was performed following the standard protocol without intravenous contrast. Multiplanar CT image reconstructions of the cervical spine were also generated. COMPARISON:  04/05/2017 FINDINGS: CT HEAD  FINDINGS Brain: Motion artifacts near vertex. Generalized atrophy. Normal ventricular morphology. No midline shift or mass effect. Small vessel chronic ischemic changes of deep cerebral white matter. No intracranial hemorrhage, mass lesion or evidence acute infarction identified within limitations of motion. Small old lacunar infarct at posterior limb of LEFT internal capsule. No extra-axial fluid collections. Vascular: Atherosclerotic calcification of internal carotid arteries at skull base Skull: Grossly intact Sinuses/Orbits: Clear Other: N/A CT CERVICAL SPINE FINDINGS Alignment: Imaging degraded by patient motion. Alignment grossly normal. Skull base and vertebrae: Visualized skull base intact. Bones demineralized. Vertebral body heights appear maintained. No definite fracture, subluxation, or bone destruction. Multilevel facet degenerative changes. Soft tissues and spinal canal: Prevertebral soft tissues normal thickness. Disc levels: Mild AP narrowing of spinal canal due to endplate spur formation at C5-C6. Disc space narrowing at C4-C5 through C6-C7. Upper chest: Lung apices clear Other: Atherosclerotic calcifications within the carotid systems. IMPRESSION: Motion artifacts on both CT head and CT cervical spine exams. Atrophy with small vessel chronic ischemic changes of deep cerebral white matter. Tiny old lacunar infarct at posterior limb LEFT internal capsule. No definite acute intracranial abnormalities. Multilevel degenerative disc and facet disease changes of the cervical spine. No definite acute cervical spine abnormalities. Electronically Signed   By: Lavonia Dana M.D.   On: 11/03/2017 18:01   Dg Chest Port 1 View  Result Date: 11/03/2017 CLINICAL DATA:  PICC line placement EXAM: PORTABLE CHEST 1 VIEW COMPARISON:  Chest x-ray of 11/03/2016 FINDINGS: Right-sided PICC line is now present with the tip seen to the lower SVC. No pneumothorax is seen. Moderate cardiomegaly is present and permanent  pacemaker remains. Mild pulmonary vascular congestion cannot be excluded. There are degenerative changes noted in both shoulders. IMPRESSION: 1. Right-sided PICC line placed with the tip seen to the lower SVC. No pneumothorax. 2. Stable moderate cardiomegaly. Cannot exclude mild pulmonary vascular congestion. Electronically Signed   By: Ivar Drape M.D.   On: 11/03/2017 17:16   [place] Ekg Site Rite  Result Date: 11/03/2017 If Site Rite image not attached, placement could not be confirmed due to current cardiac rhythm.   Microbiology: Recent Results (from the past 240 hour(s))  MRSA PCR Screening     Status: None   Collection Time: 11/05/17 11:04 AM  Result Value Ref Range Status   MRSA by PCR NEGATIVE NEGATIVE Final    Comment:        The GeneXpert MRSA Assay (FDA approved for NASAL specimens only), is one component of a comprehensive MRSA colonization surveillance program. It is not intended to diagnose MRSA infection nor to guide or monitor treatment for MRSA infections. Performed at [facility], [place] [address], [place], [place] [address]      Labs: Basic Metabolic Panel: Recent Labs  Lab 11/03/17 1344 11/04/17 1139 11/05/17 0354 11/06/17 0416 11/07/17 0507 11/08/17 0500  NA  --  139 139 139 139 139  K  --  4.0 4.1 3.3* 3.6 3.9  CL  --  101 102 101 102 101  CO2  --  24 27 31 30 29   GLUCOSE  --  70 94 119* 107* 94  BUN  --  113* 107* 66* 44* 37*  CREATININE  --  2.18* 2.03* 1.38* 1.19 1.11  CALCIUM  --  8.6* 8.7* 8.5* 8.4* 8.5*  MG 2.2  --   --   --   --   --   PHOS  --   --   --   --  2.3* 2.8   Liver Function Tests: Recent Labs  Lab 11/06/17 0416 11/07/17 0507 11/08/17 0500  AST 35  --   --   ALT 19  --   --   ALKPHOS 46  --   --   BILITOT 1.7*  --   --   PROT 5.3*  --   --   ALBUMIN 2.8* 2.7* 2.9*   No results for input(s): LIPASE, AMYLASE in the last 168 hours. No results for input(s): AMMONIA in the last 168 hours. CBC: Recent Labs  Lab  11/03/17 1153 11/04/17 0458 11/04/17 1139 11/06/17 0416 11/07/17 0507 11/08/17 0500  WBC 2.7* 5.4  --  3.9* 3.3* 3.1*  NEUTROABS  --   --   --  2.9 1.9 1.7  HGB 7.0* 8.0* 7.2* 8.5* 8.4* 8.6*  HCT 20.8* 24.9* 21.9* 26.0* 25.5* 25.8*  MCV 110.1* 106.0*  --  102.8* 104.9* 106.2*  PLT 109* 108*  --  79* 80* 88*   Cardiac Enzymes: Recent Labs  Lab 11/03/17 2357 11/04/17 0458 11/04/17 1139  TROPONINI 2.64* 2.56* 2.30*   BNP: BNP (last 3 results) Recent Labs    09/26/17 0229 09/30/17 1542 11/03/17 1344  BNP 825.6* 842.8* 1,728.6*    ProBNP (last 3 results) No results for input(s): PROBNP in the last 8760 hours.  CBG: Recent Labs  Lab 11/07/17 2012 11/08/17 0005 11/08/17 0418 11/08/17 0753 11/08/17 1228  GLUCAP 104* 113* 103* 122* 113*       Signed:  Nita Sells MD   [facility] 11/08/2017, 12:36 PM

## 2017-11-10 ENCOUNTER — Telehealth: Payer: Self-pay | Admitting: Cardiovascular Disease

## 2017-11-10 NOTE — Telephone Encounter (Signed)
Patient's wife called w/MD recommendations for med changes. She voiced understanding. Patient does not have [place] so cannot get labs done prior to 4/8

## 2017-11-10 NOTE — Telephone Encounter (Signed)
Pt c/o swelling: STAT is pt has developed SOB within 24 hours  1) How much weight have you gained and in what time span? 8 lbs since yesterday   2) If swelling, where is the swelling located? No   3) Are you currently taking a fluid pill? Yes but the dosage was decreased by hospital physician   4) Are you currently SOB? Yes "but he has it all the time"  5) Do you have a log of your daily weights (if so, list)? 188 lbs yesterday and 196 today  6) Have you gained 3 pounds in a day or 5 pounds in a week? yes  7) Have you traveled recently? No

## 2017-11-10 NOTE — Telephone Encounter (Signed)
Please increase furosemide to 80 mg AM and 40 mg PM. Make sure he brings weight log to clinic Is there a way to get labs before office visit? (Home health? over the weekend). Would be nice to have labs ready when he is seen Monday

## 2017-11-10 NOTE — Telephone Encounter (Signed)
Returned call to patient's wife. Patient was discharged from hospital on 4/2 - anemia, chest pain, renal insufficiency. Per chart review, spironolactone was discontinued and lasix dose decreased from 80 to 40mg . Per wife, patient gained 8lbs since yesterday (188lbs >> 196lbs). Patient denies SOB, has NO swelling. Wife states patient is "always" short of breath and is no worse than his baseline.   Advised would check with MD on diuretic dose adjustment given acute/chornic renal insuff in hospital - BUN/creat were elevated on admission, trended towards WNL at discharge. Patient has MD OV on 4/8

## 2017-11-14 ENCOUNTER — Ambulatory Visit (INDEPENDENT_AMBULATORY_CARE_PROVIDER_SITE_OTHER): Payer: Medicare Other | Admitting: Cardiovascular Disease

## 2017-11-14 ENCOUNTER — Encounter: Payer: Self-pay | Admitting: Cardiovascular Disease

## 2017-11-14 VITALS — BP 100/50 | HR 60 | Ht 67.0 in | Wt 186.0 lb

## 2017-11-14 DIAGNOSIS — I5022 Chronic systolic (congestive) heart failure: Secondary | ICD-10-CM

## 2017-11-14 DIAGNOSIS — Z7901 Long term (current) use of anticoagulants: Secondary | ICD-10-CM

## 2017-11-14 DIAGNOSIS — I2721 Secondary pulmonary arterial hypertension: Secondary | ICD-10-CM | POA: Diagnosis not present

## 2017-11-14 DIAGNOSIS — K766 Portal hypertension: Secondary | ICD-10-CM

## 2017-11-14 DIAGNOSIS — G4733 Obstructive sleep apnea (adult) (pediatric): Secondary | ICD-10-CM

## 2017-11-14 DIAGNOSIS — I4821 Permanent atrial fibrillation: Secondary | ICD-10-CM

## 2017-11-14 DIAGNOSIS — Z950 Presence of cardiac pacemaker: Secondary | ICD-10-CM

## 2017-11-14 DIAGNOSIS — D509 Iron deficiency anemia, unspecified: Secondary | ICD-10-CM | POA: Diagnosis not present

## 2017-11-14 DIAGNOSIS — I482 Chronic atrial fibrillation: Secondary | ICD-10-CM

## 2017-11-14 MED ORDER — FERROUS SULFATE 325 (65 FE) MG PO TABS: 325.0000 mg | ORAL_TABLET | Freq: Two times a day (BID) | ORAL | 3 refills | Status: DC

## 2017-11-14 NOTE — Progress Notes (Signed)
Patient ID: [name], male   DOB: 19-Jun-1932, 84 y.o.   MRN: [id number]    Cardiology Office Note    Date:  11/20/2017   ID:  [name], DOB 06-24-1932, MRN [id number]  PCP:  [name], MD  Cardiologist:  Shelva Majestic, M.D.; [name], MD   Chief Complaint  Patient presents with  . Pacemaker Check    History of Present Illness:  [name] is a 84 y.o. male with long-standing permanent atrial fibrillation with slow ventricular response, single chamber pacemaker, obstructive sleep apnea, systemic hypertension, nonischemic cardiomyopathy with mildly depressed left ventricular systolic function, obesity, gout and BPH.    Over the last year he has had recurrent problems with anemia  (Hgb 7) requiring transfusion.  His most recent hemoglobin was 8.6.  EGD showed angiodysplastic lesions in the gastric antrum and duodenum, 1 of which had stigmata of recent bleeding.  He was restarted on anticoagulants at discharge.[name]  He was discharged from the hospital just a week ago.  He was hospitalized twice in mid and late February for respiratory failure/heart failure.  He has not had any recent falls.  His complaint is mostly of fatigue.  He denies shortness of breath.  He had similar problems about a year ago he developed heart failure exacerbation in the setting of anemia, attributed to erosive esophagitis.  He also had problems with acute renal failure at that.  The patient specifically denies any chest pain at rest exertion, dyspnea at rest or with exertion, orthopnea, paroxysmal nocturnal dyspnea, syncope, palpitations, focal neurological deficits, intermittent claudication, lower extremity edema, unexplained weight gain, cough, hemoptysis or wheezing.  In the past his LVEF was usually estimated at about 45-50%.  During his hospitalization in February his echo reported an ejection fraction of 25-30%  He has permanent atrial fibrillation with slow ventricular response,  typically in the 40s and 50s pacemaker interrogation shows that ventricular pacing occurs 73% of the time. He has rare and brief episodes of high ventricular rate. Lead parameters are excellent. Battery longevity is estimated at about another 4.5 years ([employer]  implanted in 2015).   In 2010 he had normal coronary arteries at angiography and LV EF by LV angio was 40-45%, in 2012 echo showed LV EF of 50-55%. In 2015 EF was 55-60% by echo. In March 2018, EF 50-55 %, moderate to severe tricuspid regurgitation and an estimated systolic PA pressure of 43 mmHg. Left atrium severely dilated at 51 mm.   Past Medical History:  Diagnosis Date  . Arthritis    "feet" (09/30/2017)  . CHF (congestive heart failure) ([place])   . Chronic bronchitis ([place])   . CKD (chronic kidney disease), stage III ([place])    [name] 09/30/2017  . Coronary artery disease   . Diverticulitis   . GERD (gastroesophageal reflux disease)   . Gout   . History of blood transfusion    "blood loss" (09/30/2017)  . Hyperlipidemia   . Hypertension   . MI (myocardial infarction) ([place]) ~ 2000   "light one"  . Nonischemic cardiomyopathy (HCC)    mild  . OSA on CPAP   . Permanent atrial fibrillation (HCC)    on Eliquis  . Presence of permanent cardiac pacemaker   . Pulmonary hypertension ([place])   . Restless legs     Past Surgical History:  Procedure Laterality Date  . APPENDECTOMY  12/2000   [name] 12/22/2010  . CARDIAC CATHETERIZATION  11/07/2008   nonischemic cardiomyopathy,pulmonary  hypertension  . CATARACT EXTRACTION W/ INTRAOCULAR LENS  IMPLANT, BILATERAL Bilateral   . CIRCUMCISION  12/2005   [name] 12/22/2010  . COLOSTOMY  12/2000   [name] 12/22/2010  . COLOSTOMY REVERSAL  07/2001   [name] 12/22/2010  . CORONARY ANGIOPLASTY  06/01/1999   successful to ostium of the first diagonal  . ESOPHAGOGASTRODUODENOSCOPY N/A 08/22/2013   Procedure: ESOPHAGOGASTRODUODENOSCOPY (EGD);  Surgeon: [name], MD;  Location:  [facility] ENDOSCOPY;  Service: Endoscopy;  Laterality: N/A;  h/p in file cabinet, jackie  . ESOPHAGOGASTRODUODENOSCOPY N/A 11/05/2014   Procedure: ESOPHAGOGASTRODUODENOSCOPY (EGD);  Surgeon: Clarene Essex, MD;  Location: [facility] ENDOSCOPY;  Service: Endoscopy;  Laterality: N/A;  . ESOPHAGOGASTRODUODENOSCOPY N/A 11/08/2016   Procedure: ESOPHAGOGASTRODUODENOSCOPY (EGD);  Surgeon: [name], MD;  Location: [facility] ENDOSCOPY;  Service: Endoscopy;  Laterality: N/A;  . ESOPHAGOGASTRODUODENOSCOPY (EGD) WITH PROPOFOL Left 11/05/2017   Procedure: ESOPHAGOGASTRODUODENOSCOPY (EGD) WITH PROPOFOL;  Surgeon: [name], MD;  Location: [place];  Service: Endoscopy;  Laterality: Left;  . HOT HEMOSTASIS N/A 11/05/2014   Procedure: HOT HEMOSTASIS (ARGON PLASMA COAGULATION/BICAP);  Surgeon: Clarene Essex, MD;  Location: [facility] ENDOSCOPY;  Service: Endoscopy;  Laterality: N/A;  . INSERT / REPLACE / REMOVE PACEMAKER    . JOINT REPLACEMENT    . MASS EXCISION Left    hand w/ulnar artery reconstruction/notes 12/22/2010  . NM MYOCAR PERF WALL MOTION  11/24/2007   normal  . PERMANENT PACEMAKER INSERTION  10/04/2012   [employer]  . PERMANENT PACEMAKER INSERTION N/A 10/12/2011   Procedure: PERMANENT PACEMAKER INSERTION;  Surgeon: [name], MD;  Location: [place] CATH LAB;  Service: Cardiovascular;  Laterality: N/A;  . REPLACEMENT TOTAL KNEE Bilateral 11/2006   right-left/notes 12/22/2010  . ROTATOR CUFF REPAIR Right 09/2004   [name] 12/22/2010  . SAVORY DILATION N/A 08/22/2013   Procedure: SAVORY DILATION;  Surgeon: [name], MD;  Location: [facility] ENDOSCOPY;  Service: Endoscopy;  Laterality: N/A;  . SHOULDER SURGERY Right    "fell off house; messed up 3 things in my arm"  . TONSILLECTOMY    . US ECHOCARDIOGRAPHY  02/01/2011   LA is mod-severely dilated,AOV & root sclerotic,ca+ AOV leaflets    Outpatient Medications Prior to Visit  Medication Sig Dispense Refill  . acetaminophen (TYLENOL) 650 MG CR tablet Take 650 mg by mouth  every 8 (eight) hours as needed for pain.    [name] allopurinol (ZYLOPRIM) 100 MG tablet Take 2 tablets (200 mg total) by mouth daily. 30 tablet 1  . Amino Acids-Protein Hydrolys (FEEDING SUPPLEMENT, PRO-STAT SUGAR FREE 64,) LIQD Take 30 mLs by mouth 2 (two) times daily. 900 mL 0  . apixaban (ELIQUIS) 5 MG TABS tablet Take 1 tablet (5 mg total) by mouth 2 (two) times daily. (Patient taking differently: Take 2.5 mg by mouth 2 (two) times daily. )    . colchicine 0.6 MG tablet Take 0.6 mg by mouth daily as needed (gout flare). Colcrys    . Docusate Calcium (STOOL SOFTENER PO) Take 2 tablets by mouth at bedtime.     . dutasteride (AVODART) 0.5 MG capsule Take 0.5 mg by mouth daily.    [name] escitalopram (LEXAPRO) 5 MG tablet Take 1 tablet (5 mg total) by mouth at bedtime. 30 tablet 0  . furosemide (LASIX) 40 MG tablet Take 80mg  by mouth in the morning and 40mg  in the afternoon    . hydrOXYzine (ATARAX/VISTARIL) 25 MG tablet TAKE 1 TABLET BY MOUTH EVERY 6 HOURS AS NEEDED FOR ITCHING (Patient taking differently: TAKE 1  TABLET (25 MG) BY MOUTH EVERY 6 HOURS AS NEEDED FOR ITCHING) 90 tablet 2  . mirtazapine (REMERON) 15 MG tablet Take 15 mg by mouth at bedtime.    . Multiple Vitamin (MULTIVITAMIN WITH MINERALS) TABS tablet Take 1 tablet by mouth daily.    . Multiple Vitamins-Minerals (PRESERVISION AREDS 2) CAPS Take 1 capsule by mouth daily.    . pantoprazole (PROTONIX) 40 MG tablet Take 1 tablet (40 mg total) by mouth daily. 60 tablet 0  . potassium chloride (K-DUR) 10 MEQ tablet Take 1 tablet (10 mEq total) by mouth daily. (Patient taking differently: Take 10 mEq by mouth 2 (two) times daily. ) 90 tablet 3  . rOPINIRole (REQUIP) 3 MG tablet Take 3 mg by mouth at bedtime.     . Tamsulosin HCl (FLOMAX) 0.4 MG CAPS Take 0.4 mg by mouth daily after breakfast.    . traMADol (ULTRAM) 50 MG tablet Take 100 mg by mouth 2 (two) times daily. For pain     . ferrous sulfate 325 (65 FE) MG tablet Take 325 mg by mouth daily  with breakfast.     No facility-administered medications prior to visit.      Allergies:   Vicodin [hydrocodone-acetaminophen] and Hydrocodone   Social History   Socioeconomic History  . Marital status: Married    Spouse name: Not on file  . Number of children: Not on file  . Years of education: Not on file  . Highest education level: Not on file  Occupational History  . Occupation: retired  Scientific laboratory technician  . Financial resource strain: Not on file  . Food insecurity:    Worry: Not on file    Inability: Not on file  . Transportation needs:    Medical: Not on file    Non-medical: Not on file  Tobacco Use  . Smoking status: Former Research scientist (life sciences)  . Smokeless tobacco: Never Used  . Tobacco comment: 09/30/2017 "it's been over 4yr since I smoked anything"  Substance and Sexual Activity  . Alcohol use: No  . Drug use: No  . Sexual activity: Never  Lifestyle  . Physical activity:    Days per week: Not on file    Minutes per session: Not on file  . Stress: Not on file  Relationships  . Social connections:    Talks on phone: Not on file    Gets together: Not on file    Attends religious service: Not on file    Active member of club or organization: Not on file    Attends meetings of clubs or organizations: Not on file    Relationship status: Not on file  Other Topics Concern  . Not on file  Social History Narrative  . Not on file     Family History:  The patient's family history includes Cancer in his mother; Diabetes in his brother; Heart attack in his father.   ROS:   Please see the history of present illness.    ROS All other systems reviewed and are negative.   PHYSICAL EXAM:   VS:  BP (!) 100/50   Pulse 60   Ht 5\' 7"  (1.702 m)   Wt 186 lb (84.4 kg)   BMI 29.13 kg/m     General: Alert, oriented x3, no distress, pale Head: no evidence of trauma, PERRL, EOMI, no exophtalmos or lid lag, no myxedema, no xanthelasma; normal ears, nose and oropharynx Neck: normal  jugular venous pulsations and no hepatojugular reflux; brisk carotid pulses without delay  and no carotid bruits Chest: clear to auscultation, no signs of consolidation by percussion or palpation, normal fremitus, symmetrical and full respiratory excursions Cardiovascular: normal position and quality of the apical impulse, regular rhythm, normal first and paradoxically split second heart sounds, no murmurs, rubs or gallops.  Healthy pacemaker site Abdomen: no tenderness or distention, no masses by palpation, no abnormal pulsatility or arterial bruits, normal bowel sounds, no hepatosplenomegaly Extremities: no clubbing, cyanosis or edema; 2+ radial, ulnar and brachial pulses bilaterally; 2+ right femoral, posterior tibial and dorsalis pedis pulses; 2+ left femoral, posterior tibial and dorsalis pedis pulses; no subclavian or femoral bruits Neurological: grossly nonfocal Psych: Normal mood and affect    Wt Readings from Last 3 Encounters:  11/14/17 186 lb (84.4 kg)  11/08/17 198 lb 9.6 oz (90.1 kg)  10/02/17 203 lb 8 oz (92.3 kg)      Studies/Labs Reviewed:   EKG:  EKG is ordered today.  It shows atrial fibrillation with 100% ventricular pacing Recent Labs: 11/03/2017: B Natriuretic Peptide 1,728.6; Magnesium 2.2; TSH 3.099 11/06/2017: ALT 19 11/08/2017: BUN 37; Creatinine, Ser 1.11; Hemoglobin 8.6; Platelets 88; Potassium 3.9; Sodium 139   11/25/2016, [facility] Creatinine 1.0, potassium 4.1, hemoglobin 10.6  ASSESSMENT:    1. Permanent atrial fibrillation ([place])   2. Chronic systolic heart failure ([place])   3. Pacemaker   4. Long term (current) use of anticoagulants   5. PAH (pulmonary arterial hypertension) with portal hypertension ([place])   6. OSA (obstructive sleep apnea)   7. Iron deficiency anemia, unspecified iron deficiency anemia type      PLAN:  In order of problems listed above:  1. AFib: Slow ventricular rates consistent with significant AV node disease.  Ventricular  pacing occurs 70% of the time despite no negative chronotropic agents.. CHADSVasc at least 3 (age 73, CHF), high embolic risk.  He has had recurrent bleeding on appropriate anticoagulation.  This last event may have occurred because he was taking aspirin.  Start him on iron supplements, but I think the threshold for discontinuation of anticoagulants is getting lower. 2. CHF: In the past he seems to mostly have right heart failure.  This last echo shows substantial decrease in left ventricular systolic function.  He does not have angina pectoris and previous coronary angiography did not show any evidence of atherosclerotic lesions.  As before, heart failure exacerbation has been triggered by anemia blood pressure does not allow additional treatment with RAAS inhibition.  Beta-blockers for heart failure may actually be deleterious by increasing the need for ventricular pacing even further.  Managed conservatively with diuretics. 3. PPM normal device function. Plan a remote download in 3 months. 4. Eliquis: If he continues to have symptomatic anemia despite iron supplementation, we may have to stop this medication. 5. PAH: The most likely cause of pulmonary hypertension is insufficiently treated obstructive sleep apnea.  Did not confirm or exclude the presence of diastolic left ventricular failure in the setting of permanent atrial fibrillation. 6. OSA: Reinforced the need for 100% compliance with CPAP. 7. Anemia: Start iron supplements.  Reassess hemoglobin in several weeks.      Medication Adjustments/Labs and Tests Ordered: Current medicines are reviewed at length with the patient today.  Concerns regarding medicines are outlined above.  Medication changes, Labs and Tests ordered today are listed in the Patient Instructions below. Patient Instructions  Dr [name] has recommended making the following medication changes: 1. START Ferrous Sulfate (Iron) 325 mg twice daily with meals  Your physician  recommends that you return for lab work in 1 week.  Remote monitoring is used to monitor your Pacemaker or ICD from home. This monitoring reduces the number of office visits required to check your device to one time per year. It allows [place] to keep an eye on the functioning of your device to ensure it is working properly. You are scheduled for a device check from home on Thursday, May 2nd, 2019. You may send your transmission at any time that day. If you have a wireless device, the transmission will be sent automatically. After your physician reviews your transmission, you will receive a notification with your next transmission date.  Dr [name] recommends that you schedule a follow-up appointment in 6 months with a pacemaker check. You will receive a reminder letter in the mail two months in advance. If you don't receive a letter, please call our office to schedule the follow-up appointment.  If you need a refill on your cardiac medications before your next appointment, please call your pharmacy.      Signed, [name], MD  11/20/2017 5:33 PM    [address], [place], [place]  [address] Phone: [phone]; Fax: [phone]

## 2017-11-14 NOTE — Patient Instructions (Signed)
Dr [name] has recommended making the following medication changes: 1. START Ferrous Sulfate (Iron) 325 mg twice daily with meals  Your physician recommends that you return for lab work in 1 week.  Remote monitoring is used to monitor your Pacemaker or ICD from home. This monitoring reduces the number of office visits required to check your device to one time per year. It allows [place] to keep an eye on the functioning of your device to ensure it is working properly. You are scheduled for a device check from home on Thursday, May 2nd, 2019. You may send your transmission at any time that day. If you have a wireless device, the transmission will be sent automatically. After your physician reviews your transmission, you will receive a notification with your next transmission date.  Dr [name] recommends that you schedule a follow-up appointment in 6 months with a pacemaker check. You will receive a reminder letter in the mail two months in advance. If you don't receive a letter, please call our office to schedule the follow-up appointment.  If you need a refill on your cardiac medications before your next appointment, please call your pharmacy.

## 2017-11-20 ENCOUNTER — Encounter: Payer: Self-pay | Admitting: Cardiovascular Disease

## 2017-11-22 DIAGNOSIS — D509 Iron deficiency anemia, unspecified: Secondary | ICD-10-CM | POA: Diagnosis not present

## 2017-11-23 LAB — HEMOGLOBIN AND HEMATOCRIT, BLOOD
HEMATOCRIT: 30.6 % — AB (ref 37.5–51.0)
HEMOGLOBIN: 9.8 g/dL — AB (ref 13.0–17.7)

## 2017-11-28 ENCOUNTER — Telehealth: Payer: Self-pay | Admitting: Cardiovascular Disease

## 2017-11-28 NOTE — Telephone Encounter (Signed)
Pt's wife returning call to nurse from last week

## 2017-11-28 NOTE — Telephone Encounter (Signed)
H&H results given to Pt's wife. Verbalized understanding.

## 2017-11-29 ENCOUNTER — Other Ambulatory Visit: Payer: Self-pay | Admitting: Cardiovascular Disease

## 2017-12-08 ENCOUNTER — Encounter: Payer: Medicare Other | Admitting: *Deleted

## 2017-12-08 ENCOUNTER — Telehealth: Payer: Self-pay | Admitting: Cardiology

## 2017-12-08 NOTE — Telephone Encounter (Signed)
LMOVM reminding pt to send remote transmission.   

## 2017-12-15 ENCOUNTER — Encounter: Payer: Self-pay | Admitting: Cardiology

## 2017-12-27 DIAGNOSIS — Z0189 Encounter for other specified special examinations: Secondary | ICD-10-CM | POA: Diagnosis not present

## 2017-12-27 DIAGNOSIS — S098XXA Other specified injuries of head, initial encounter: Secondary | ICD-10-CM | POA: Diagnosis not present

## 2017-12-27 DIAGNOSIS — R58 Hemorrhage, not elsewhere classified: Secondary | ICD-10-CM | POA: Diagnosis not present

## 2017-12-27 DIAGNOSIS — S0101XA Laceration without foreign body of scalp, initial encounter: Secondary | ICD-10-CM | POA: Diagnosis not present

## 2017-12-27 DIAGNOSIS — W19XXXA Unspecified fall, initial encounter: Secondary | ICD-10-CM | POA: Diagnosis not present

## 2017-12-27 DIAGNOSIS — R0902 Hypoxemia: Secondary | ICD-10-CM | POA: Diagnosis not present

## 2017-12-27 DIAGNOSIS — Z7901 Long term (current) use of anticoagulants: Secondary | ICD-10-CM | POA: Diagnosis not present

## 2017-12-27 DIAGNOSIS — M542 Cervicalgia: Secondary | ICD-10-CM | POA: Diagnosis not present

## 2017-12-27 DIAGNOSIS — S3993XA Unspecified injury of pelvis, initial encounter: Secondary | ICD-10-CM | POA: Diagnosis not present

## 2017-12-27 DIAGNOSIS — R52 Pain, unspecified: Secondary | ICD-10-CM | POA: Diagnosis not present

## 2017-12-27 DIAGNOSIS — S0990XA Unspecified injury of head, initial encounter: Secondary | ICD-10-CM | POA: Diagnosis not present

## 2018-01-04 ENCOUNTER — Ambulatory Visit: Payer: Medicare Other | Admitting: Podiatry

## 2018-01-09 DIAGNOSIS — R296 Repeated falls: Secondary | ICD-10-CM | POA: Diagnosis not present

## 2018-01-09 DIAGNOSIS — R1030 Lower abdominal pain, unspecified: Secondary | ICD-10-CM | POA: Diagnosis not present

## 2018-01-09 DIAGNOSIS — D508 Other iron deficiency anemias: Secondary | ICD-10-CM | POA: Diagnosis not present

## 2018-01-09 DIAGNOSIS — Z6831 Body mass index (BMI) 31.0-31.9, adult: Secondary | ICD-10-CM | POA: Diagnosis not present

## 2018-01-09 DIAGNOSIS — I480 Paroxysmal atrial fibrillation: Secondary | ICD-10-CM | POA: Diagnosis not present

## 2018-01-10 ENCOUNTER — Ambulatory Visit
Admission: RE | Admit: 2018-01-10 | Discharge: 2018-01-10 | Disposition: A | Discharge status: Home / Self Care | Payer: Medicare Other | Source: Ambulatory Visit | Attending: Registered Nurse | Admitting: Registered Nurse

## 2018-01-10 ENCOUNTER — Other Ambulatory Visit: Payer: Self-pay | Admitting: Registered Nurse

## 2018-01-10 DIAGNOSIS — R52 Pain, unspecified: Secondary | ICD-10-CM

## 2018-01-10 DIAGNOSIS — R188 Other ascites: Secondary | ICD-10-CM | POA: Diagnosis not present

## 2018-01-11 ENCOUNTER — Other Ambulatory Visit: Payer: Self-pay | Admitting: Cardiovascular Disease

## 2018-01-11 NOTE — Telephone Encounter (Signed)
Rx sent to pharmacy   

## 2018-01-12 ENCOUNTER — Other Ambulatory Visit: Payer: Self-pay

## 2018-01-12 ENCOUNTER — Emergency Department (HOSPITAL_COMMUNITY): Payer: Medicare Other

## 2018-01-12 ENCOUNTER — Encounter (HOSPITAL_COMMUNITY): Payer: Self-pay

## 2018-01-12 ENCOUNTER — Inpatient Hospital Stay (HOSPITAL_COMMUNITY)
Admission: EM | Admit: 2018-01-12 | Discharge: 2018-01-18 | DRG: 291 | Disposition: A | Discharge status: Home Health | Payer: Medicare Other | Attending: Family Medicine | Admitting: Family Medicine

## 2018-01-12 DIAGNOSIS — G9341 Metabolic encephalopathy: Secondary | ICD-10-CM | POA: Diagnosis present

## 2018-01-12 DIAGNOSIS — N400 Enlarged prostate without lower urinary tract symptoms: Secondary | ICD-10-CM | POA: Diagnosis present

## 2018-01-12 DIAGNOSIS — Z79891 Long term (current) use of opiate analgesic: Secondary | ICD-10-CM

## 2018-01-12 DIAGNOSIS — I2510 Atherosclerotic heart disease of native coronary artery without angina pectoris: Secondary | ICD-10-CM | POA: Diagnosis present

## 2018-01-12 DIAGNOSIS — Z7901 Long term (current) use of anticoagulants: Secondary | ICD-10-CM

## 2018-01-12 DIAGNOSIS — J9601 Acute respiratory failure with hypoxia: Secondary | ICD-10-CM | POA: Diagnosis not present

## 2018-01-12 DIAGNOSIS — Z950 Presence of cardiac pacemaker: Secondary | ICD-10-CM

## 2018-01-12 DIAGNOSIS — I481 Persistent atrial fibrillation: Secondary | ICD-10-CM | POA: Diagnosis present

## 2018-01-12 DIAGNOSIS — I4819 Other persistent atrial fibrillation: Secondary | ICD-10-CM | POA: Diagnosis present

## 2018-01-12 DIAGNOSIS — I5023 Acute on chronic systolic (congestive) heart failure: Secondary | ICD-10-CM | POA: Diagnosis present

## 2018-01-12 DIAGNOSIS — Z87891 Personal history of nicotine dependence: Secondary | ICD-10-CM

## 2018-01-12 DIAGNOSIS — Z515 Encounter for palliative care: Secondary | ICD-10-CM | POA: Diagnosis present

## 2018-01-12 DIAGNOSIS — K921 Melena: Secondary | ICD-10-CM | POA: Diagnosis present

## 2018-01-12 DIAGNOSIS — E872 Acidosis: Secondary | ICD-10-CM | POA: Diagnosis present

## 2018-01-12 DIAGNOSIS — J9621 Acute and chronic respiratory failure with hypoxia: Secondary | ICD-10-CM | POA: Diagnosis present

## 2018-01-12 DIAGNOSIS — I959 Hypotension, unspecified: Secondary | ICD-10-CM | POA: Diagnosis not present

## 2018-01-12 DIAGNOSIS — W19XXXA Unspecified fall, initial encounter: Secondary | ICD-10-CM | POA: Diagnosis present

## 2018-01-12 DIAGNOSIS — I2729 Other secondary pulmonary hypertension: Secondary | ICD-10-CM | POA: Diagnosis present

## 2018-01-12 DIAGNOSIS — K219 Gastro-esophageal reflux disease without esophagitis: Secondary | ICD-10-CM | POA: Diagnosis present

## 2018-01-12 DIAGNOSIS — I482 Chronic atrial fibrillation, unspecified: Secondary | ICD-10-CM | POA: Diagnosis present

## 2018-01-12 DIAGNOSIS — I5022 Chronic systolic (congestive) heart failure: Secondary | ICD-10-CM | POA: Diagnosis not present

## 2018-01-12 DIAGNOSIS — I4430 Unspecified atrioventricular block: Secondary | ICD-10-CM | POA: Diagnosis present

## 2018-01-12 DIAGNOSIS — Z79899 Other long term (current) drug therapy: Secondary | ICD-10-CM

## 2018-01-12 DIAGNOSIS — F0390 Unspecified dementia without behavioral disturbance: Secondary | ICD-10-CM | POA: Diagnosis present

## 2018-01-12 DIAGNOSIS — I071 Rheumatic tricuspid insufficiency: Secondary | ICD-10-CM | POA: Diagnosis present

## 2018-01-12 DIAGNOSIS — G4733 Obstructive sleep apnea (adult) (pediatric): Secondary | ICD-10-CM | POA: Diagnosis not present

## 2018-01-12 DIAGNOSIS — D7589 Other specified diseases of blood and blood-forming organs: Secondary | ICD-10-CM | POA: Diagnosis present

## 2018-01-12 DIAGNOSIS — J9622 Acute and chronic respiratory failure with hypercapnia: Secondary | ICD-10-CM | POA: Diagnosis present

## 2018-01-12 DIAGNOSIS — Z9989 Dependence on other enabling machines and devices: Secondary | ICD-10-CM

## 2018-01-12 DIAGNOSIS — Z9981 Dependence on supplemental oxygen: Secondary | ICD-10-CM

## 2018-01-12 DIAGNOSIS — R296 Repeated falls: Secondary | ICD-10-CM | POA: Diagnosis present

## 2018-01-12 DIAGNOSIS — N179 Acute kidney failure, unspecified: Secondary | ICD-10-CM | POA: Diagnosis present

## 2018-01-12 DIAGNOSIS — I428 Other cardiomyopathies: Secondary | ICD-10-CM

## 2018-01-12 DIAGNOSIS — Z9119 Patient's noncompliance with other medical treatment and regimen: Secondary | ICD-10-CM

## 2018-01-12 DIAGNOSIS — M109 Gout, unspecified: Secondary | ICD-10-CM | POA: Diagnosis present

## 2018-01-12 DIAGNOSIS — I5082 Biventricular heart failure: Secondary | ICD-10-CM | POA: Diagnosis present

## 2018-01-12 DIAGNOSIS — S3210XA Unspecified fracture of sacrum, initial encounter for closed fracture: Secondary | ICD-10-CM | POA: Diagnosis present

## 2018-01-12 DIAGNOSIS — J9602 Acute respiratory failure with hypercapnia: Secondary | ICD-10-CM

## 2018-01-12 DIAGNOSIS — N170 Acute kidney failure with tubular necrosis: Secondary | ICD-10-CM

## 2018-01-12 DIAGNOSIS — I509 Heart failure, unspecified: Secondary | ICD-10-CM

## 2018-01-12 DIAGNOSIS — D631 Anemia in chronic kidney disease: Secondary | ICD-10-CM | POA: Diagnosis present

## 2018-01-12 DIAGNOSIS — I50813 Acute on chronic right heart failure: Secondary | ICD-10-CM | POA: Diagnosis not present

## 2018-01-12 DIAGNOSIS — Z885 Allergy status to narcotic agent status: Secondary | ICD-10-CM

## 2018-01-12 DIAGNOSIS — R0609 Other forms of dyspnea: Secondary | ICD-10-CM

## 2018-01-12 DIAGNOSIS — I252 Old myocardial infarction: Secondary | ICD-10-CM

## 2018-01-12 DIAGNOSIS — N171 Acute kidney failure with acute cortical necrosis: Secondary | ICD-10-CM | POA: Diagnosis not present

## 2018-01-12 DIAGNOSIS — I429 Cardiomyopathy, unspecified: Secondary | ICD-10-CM | POA: Diagnosis present

## 2018-01-12 DIAGNOSIS — I130 Hypertensive heart and chronic kidney disease with heart failure and stage 1 through stage 4 chronic kidney disease, or unspecified chronic kidney disease: Secondary | ICD-10-CM | POA: Diagnosis not present

## 2018-01-12 DIAGNOSIS — D469 Myelodysplastic syndrome, unspecified: Secondary | ICD-10-CM | POA: Diagnosis present

## 2018-01-12 DIAGNOSIS — T501X6A Underdosing of loop [high-ceiling] diuretics, initial encounter: Secondary | ICD-10-CM | POA: Diagnosis present

## 2018-01-12 DIAGNOSIS — M1990 Unspecified osteoarthritis, unspecified site: Secondary | ICD-10-CM | POA: Diagnosis present

## 2018-01-12 DIAGNOSIS — R402413 Glasgow coma scale score 13-15, at hospital admission: Secondary | ICD-10-CM | POA: Diagnosis present

## 2018-01-12 DIAGNOSIS — K2980 Duodenitis without bleeding: Secondary | ICD-10-CM | POA: Diagnosis present

## 2018-01-12 DIAGNOSIS — K5792 Diverticulitis of intestine, part unspecified, without perforation or abscess without bleeding: Secondary | ICD-10-CM | POA: Diagnosis present

## 2018-01-12 DIAGNOSIS — I340 Nonrheumatic mitral (valve) insufficiency: Secondary | ICD-10-CM | POA: Diagnosis not present

## 2018-01-12 DIAGNOSIS — N183 Chronic kidney disease, stage 3 unspecified: Secondary | ICD-10-CM | POA: Diagnosis present

## 2018-01-12 DIAGNOSIS — N5089 Other specified disorders of the male genital organs: Secondary | ICD-10-CM | POA: Diagnosis present

## 2018-01-12 DIAGNOSIS — Z7189 Other specified counseling: Secondary | ICD-10-CM

## 2018-01-12 DIAGNOSIS — J42 Unspecified chronic bronchitis: Secondary | ICD-10-CM | POA: Diagnosis present

## 2018-01-12 DIAGNOSIS — R0602 Shortness of breath: Secondary | ICD-10-CM | POA: Diagnosis not present

## 2018-01-12 DIAGNOSIS — R51 Headache: Secondary | ICD-10-CM | POA: Diagnosis not present

## 2018-01-12 DIAGNOSIS — Z8719 Personal history of other diseases of the digestive system: Secondary | ICD-10-CM

## 2018-01-12 DIAGNOSIS — Z9049 Acquired absence of other specified parts of digestive tract: Secondary | ICD-10-CM

## 2018-01-12 DIAGNOSIS — Z9861 Coronary angioplasty status: Secondary | ICD-10-CM

## 2018-01-12 DIAGNOSIS — I110 Hypertensive heart disease with heart failure: Secondary | ICD-10-CM | POA: Diagnosis not present

## 2018-01-12 DIAGNOSIS — Z96653 Presence of artificial knee joint, bilateral: Secondary | ICD-10-CM | POA: Diagnosis present

## 2018-01-12 DIAGNOSIS — G2581 Restless legs syndrome: Secondary | ICD-10-CM | POA: Diagnosis present

## 2018-01-12 DIAGNOSIS — R29700 NIHSS score 0: Secondary | ICD-10-CM | POA: Diagnosis present

## 2018-01-12 DIAGNOSIS — Z9181 History of falling: Secondary | ICD-10-CM

## 2018-01-12 LAB — FERRITIN: FERRITIN: 49 ng/mL (ref 24–336)

## 2018-01-12 LAB — I-STAT ARTERIAL BLOOD GAS, ED
ACID-BASE DEFICIT: 1 mmol/L (ref 0.0–2.0)
Bicarbonate: 26 mmol/L (ref 20.0–28.0)
O2 SAT: 99 %
PCO2 ART: 56.1 mmHg — AB (ref 32.0–48.0)
PH ART: 7.269 — AB (ref 7.350–7.450)
PO2 ART: 156 mmHg — AB (ref 83.0–108.0)
Patient temperature: 97
TCO2: 28 mmol/L (ref 22–32)

## 2018-01-12 LAB — BASIC METABOLIC PANEL
Anion gap: 9 (ref 5–15)
BUN: 55 mg/dL — AB (ref 6–20)
CHLORIDE: 107 mmol/L (ref 101–111)
CO2: 26 mmol/L (ref 22–32)
CREATININE: 1.54 mg/dL — AB (ref 0.61–1.24)
Calcium: 8.7 mg/dL — ABNORMAL LOW (ref 8.9–10.3)
GFR, EST AFRICAN AMERICAN: 46 mL/min — AB (ref >60)
GFR, EST NON AFRICAN AMERICAN: 40 mL/min — AB (ref >60)
Glucose, Bld: 103 mg/dL — ABNORMAL HIGH (ref 65–99)
Potassium: 4.1 mmol/L (ref 3.5–5.1)
SODIUM: 142 mmol/L (ref 135–145)

## 2018-01-12 LAB — I-STAT VENOUS BLOOD GAS, ED
Acid-base deficit: 3 mmol/L — ABNORMAL HIGH (ref 0.0–2.0)
Bicarbonate: 25.1 mmol/L (ref 20.0–28.0)
O2 SAT: 91 %
PCO2 VEN: 59.3 mmHg (ref 44.0–60.0)
PO2 VEN: 74 mmHg — AB (ref 32.0–45.0)
TCO2: 27 mmol/L (ref 22–32)
pH, Ven: 7.235 — ABNORMAL LOW (ref 7.250–7.430)

## 2018-01-12 LAB — URINALYSIS, ROUTINE W REFLEX MICROSCOPIC
BILIRUBIN URINE: NEGATIVE
GLUCOSE, UA: NEGATIVE mg/dL
HGB URINE DIPSTICK: NEGATIVE
KETONES UR: NEGATIVE mg/dL
Leukocytes, UA: NEGATIVE
NITRITE: NEGATIVE
PH: 5 (ref 5.0–8.0)
Protein, ur: NEGATIVE mg/dL
Specific Gravity, Urine: 1.008 (ref 1.005–1.030)

## 2018-01-12 LAB — CBC
HCT: 25.8 % — ABNORMAL LOW (ref 39.0–52.0)
Hemoglobin: 8.1 g/dL — ABNORMAL LOW (ref 13.0–17.0)
MCH: 35.8 pg — ABNORMAL HIGH (ref 26.0–34.0)
MCHC: 31.4 g/dL (ref 30.0–36.0)
MCV: 114.2 fL — ABNORMAL HIGH (ref 78.0–100.0)
PLATELETS: 164 10*3/uL (ref 150–400)
RBC: 2.26 MIL/uL — AB (ref 4.22–5.81)
RDW: 17.6 % — AB (ref 11.5–15.5)
WBC: 4.3 10*3/uL (ref 4.0–10.5)

## 2018-01-12 LAB — FOLATE: Folate: 33 ng/mL (ref >5.9)

## 2018-01-12 LAB — IRON AND TIBC
Iron: 51 ug/dL (ref 45–182)
Saturation Ratios: 17 % — ABNORMAL LOW (ref 17.9–39.5)
TIBC: 301 ug/dL (ref 250–450)
UIBC: 250 ug/dL

## 2018-01-12 LAB — TROPONIN I: TROPONIN I: 0.03 ng/mL — AB (ref <0.03)

## 2018-01-12 LAB — HEMOGLOBIN A1C
Hgb A1c MFr Bld: 5.2 % (ref 4.8–5.6)
MEAN PLASMA GLUCOSE: 102.54 mg/dL

## 2018-01-12 LAB — MAGNESIUM: MAGNESIUM: 2.2 mg/dL (ref 1.7–2.4)

## 2018-01-12 LAB — RETICULOCYTES
RBC.: 3.62 MIL/uL — AB (ref 4.22–5.81)
RETIC CT PCT: 2.7 % (ref 0.4–3.1)
Retic Count, Absolute: 97.7 10*3/uL (ref 19.0–186.0)

## 2018-01-12 LAB — I-STAT TROPONIN, ED: TROPONIN I, POC: 0.01 ng/mL (ref 0.00–0.08)

## 2018-01-12 LAB — VITAMIN B12: VITAMIN B 12: 553 pg/mL (ref 180–914)

## 2018-01-12 LAB — MRSA PCR SCREENING: MRSA by PCR: POSITIVE — AB

## 2018-01-12 LAB — BRAIN NATRIURETIC PEPTIDE: B NATRIURETIC PEPTIDE 5: 1360.9 pg/mL — AB (ref 0.0–100.0)

## 2018-01-12 LAB — AMMONIA: Ammonia: 41 umol/L — ABNORMAL HIGH (ref 9–35)

## 2018-01-12 MED ORDER — FUROSEMIDE 10 MG/ML IJ SOLN
40.0000 mg | Freq: Once | INTRAMUSCULAR | Status: DC
  Filled 2018-01-12: qty 4

## 2018-01-12 MED ORDER — METRONIDAZOLE IN NACL 5-0.79 MG/ML-% IV SOLN
500.0000 mg | Freq: Three times a day (TID) | INTRAVENOUS | Status: DC
  Administered 2018-01-12: 500 mg via INTRAVENOUS
  Filled 2018-01-12 (×3): qty 100

## 2018-01-12 MED ORDER — FERROUS SULFATE 325 (65 FE) MG PO TABS
325.0000 mg | ORAL_TABLET | Freq: Two times a day (BID) | ORAL | Status: DC
  Administered 2018-01-13 – 2018-01-18 (×10): 325 mg via ORAL
  Filled 2018-01-12 (×11): qty 1

## 2018-01-12 MED ORDER — ACETAMINOPHEN 650 MG RE SUPP: 650.0000 mg | Freq: Four times a day (QID) | RECTAL | Status: DC | PRN

## 2018-01-12 MED ORDER — NALOXONE HCL 0.4 MG/ML IJ SOLN: 0.4000 mg | INTRAMUSCULAR | Status: DC | PRN

## 2018-01-12 MED ORDER — GUAIFENESIN ER 600 MG PO TB12
600.0000 mg | ORAL_TABLET | Freq: Two times a day (BID) | ORAL | Status: DC
  Administered 2018-01-12 – 2018-01-13 (×2): 600 mg via ORAL
  Filled 2018-01-12 (×2): qty 1

## 2018-01-12 MED ORDER — COLCHICINE 0.6 MG PO TABS: 0.6000 mg | ORAL_TABLET | Freq: Every day | ORAL | Status: DC | PRN

## 2018-01-12 MED ORDER — PANTOPRAZOLE SODIUM 40 MG PO TBEC
40.0000 mg | DELAYED_RELEASE_TABLET | Freq: Every day | ORAL | Status: DC
  Administered 2018-01-12 – 2018-01-18 (×7): 40 mg via ORAL
  Filled 2018-01-12 (×7): qty 1

## 2018-01-12 MED ORDER — LEVALBUTEROL HCL 0.63 MG/3ML IN NEBU
0.6300 mg | INHALATION_SOLUTION | Freq: Four times a day (QID) | RESPIRATORY_TRACT | Status: DC
  Administered 2018-01-12 (×3): 0.63 mg via RESPIRATORY_TRACT
  Filled 2018-01-12 (×3): qty 3

## 2018-01-12 MED ORDER — METRONIDAZOLE IN NACL 5-0.79 MG/ML-% IV SOLN
500.0000 mg | Freq: Three times a day (TID) | INTRAVENOUS | Status: AC
  Administered 2018-01-12 – 2018-01-16 (×13): 500 mg via INTRAVENOUS
  Filled 2018-01-12 (×13): qty 100

## 2018-01-12 MED ORDER — FUROSEMIDE 10 MG/ML IJ SOLN
80.0000 mg | Freq: Once | INTRAMUSCULAR | Status: AC
  Administered 2018-01-12: 80 mg via INTRAVENOUS
  Filled 2018-01-12: qty 8

## 2018-01-12 MED ORDER — MIRTAZAPINE 7.5 MG PO TABS
15.0000 mg | ORAL_TABLET | Freq: Every day | ORAL | Status: DC
  Administered 2018-01-13 – 2018-01-17 (×6): 15 mg via ORAL
  Filled 2018-01-12 (×6): qty 2

## 2018-01-12 MED ORDER — APIXABAN 2.5 MG PO TABS
2.5000 mg | ORAL_TABLET | Freq: Two times a day (BID) | ORAL | Status: DC
  Administered 2018-01-12: 2.5 mg via ORAL
  Filled 2018-01-12 (×2): qty 1

## 2018-01-12 MED ORDER — ONDANSETRON HCL 4 MG PO TABS: 4.0000 mg | ORAL_TABLET | Freq: Four times a day (QID) | ORAL | Status: DC | PRN

## 2018-01-12 MED ORDER — HYDROXYZINE HCL 25 MG PO TABS
25.0000 mg | ORAL_TABLET | Freq: Four times a day (QID) | ORAL | Status: DC | PRN
  Administered 2018-01-12 – 2018-01-13 (×2): 25 mg via ORAL
  Filled 2018-01-12 (×2): qty 1

## 2018-01-12 MED ORDER — LEVALBUTEROL HCL 0.63 MG/3ML IN NEBU
0.6300 mg | INHALATION_SOLUTION | Freq: Three times a day (TID) | RESPIRATORY_TRACT | Status: DC
  Administered 2018-01-13 – 2018-01-18 (×15): 0.63 mg via RESPIRATORY_TRACT
  Filled 2018-01-12 (×16): qty 3

## 2018-01-12 MED ORDER — ROPINIROLE HCL 1 MG PO TABS
3.0000 mg | ORAL_TABLET | Freq: Every day | ORAL | Status: DC
  Administered 2018-01-12 – 2018-01-17 (×6): 3 mg via ORAL
  Filled 2018-01-12 (×6): qty 3

## 2018-01-12 MED ORDER — ALLOPURINOL 100 MG PO TABS
200.0000 mg | ORAL_TABLET | Freq: Every day | ORAL | Status: DC
  Administered 2018-01-13 – 2018-01-18 (×6): 200 mg via ORAL
  Filled 2018-01-12 (×6): qty 2

## 2018-01-12 MED ORDER — FUROSEMIDE 10 MG/ML IJ SOLN
60.0000 mg | Freq: Two times a day (BID) | INTRAMUSCULAR | Status: DC
  Administered 2018-01-12 – 2018-01-18 (×12): 60 mg via INTRAVENOUS
  Filled 2018-01-12 (×12): qty 6

## 2018-01-12 MED ORDER — ACETAMINOPHEN 325 MG PO TABS
650.0000 mg | ORAL_TABLET | Freq: Four times a day (QID) | ORAL | Status: DC | PRN
Start: 2018-01-12 — End: 2018-01-18
  Administered 2018-01-13 – 2018-01-16 (×3): 650 mg via ORAL
  Filled 2018-01-12 (×3): qty 2

## 2018-01-12 MED ORDER — TAMSULOSIN HCL 0.4 MG PO CAPS
0.4000 mg | ORAL_CAPSULE | Freq: Every day | ORAL | Status: DC
  Administered 2018-01-13 – 2018-01-18 (×6): 0.4 mg via ORAL
  Filled 2018-01-12 (×6): qty 1

## 2018-01-12 MED ORDER — SODIUM CHLORIDE 0.9 % IV SOLN
1.0000 g | INTRAVENOUS | Status: AC
  Administered 2018-01-12 – 2018-01-16 (×5): 1 g via INTRAVENOUS
  Filled 2018-01-12 (×5): qty 10

## 2018-01-12 MED ORDER — ONDANSETRON HCL 4 MG/2ML IJ SOLN: 4.0000 mg | Freq: Four times a day (QID) | INTRAMUSCULAR | Status: DC | PRN

## 2018-01-12 NOTE — Progress Notes (Signed)
Called and reported ABG results to Dr. Allyson Sabal. All results were within normal range. MD stated to try patient off BIPAP & see how he tolerates it. Patient was taken off BIPAP & placed on a 5L [place]. Patient is doing well at this time. All vitals are within normal limits. BIPAP is at the bedside on standby.

## 2018-01-12 NOTE — ED Provider Notes (Signed)
Medical screening examination/treatment/procedure(s) were performed by non-physician practitioner and as supervising physician I was immediately available for consultation/collaboration.  EKG Interpretation  Date/Time:  Thursday January 12 2018 02:58:09 EDT Ventricular Rate:  61 PR Interval:    QRS Duration: 214 QT Interval:  524 QTC Calculation: 527 R Axis:   -81 Text Interpretation:  Ventricular-paced rhythm Abnormal ECG Confirmed by Veryl Speak [phone]) on 01/12/2018 3:3:20 AM 84 year old male here with cough and shortness of breath.  Is also has scrotal edema.  Troponin negative.  EKG without acute changes.  Findings consistent with CHF and patient given Lasix and will be admitted to the hospital   Lacretia Leigh, MD 01/12/18 [phone]

## 2018-01-12 NOTE — ED Triage Notes (Signed)
Pt is SOB started yesterday, hx of CHF, began to have severe scrotal swelling that started two days ago.

## 2018-01-12 NOTE — ED Notes (Signed)
Patient placed on o2 at families request, states he is on CPAP at night.

## 2018-01-12 NOTE — ED Notes (Signed)
Helped patient up side the bed to use the urinal patient now is back in bed with call bell in reach and family at bedside

## 2018-01-12 NOTE — Consult Note (Addendum)
Cardiology Consultation:   Patient ID: JAHQUAN KLUGH; [id number]; 03/03/1932   Admit date: 01/12/2018 Date of Consult: 01/12/2018  Primary Care Provider: [name], MD Primary Cardiologist: Shelva Majestic, M.D.; [name], MD    Patient Profile:   [name] is a 84 y.o. male with a hx of nonischemic cardiomyopathy (EF 40-45%), right sided heart failure, PAH, OSA, severe tricuspid regurgitation, permanent atrial fibrillation with slow ventricular response, single chamber pacemaker, and HTN who is being seen today for the evaluation of  at the request of Dr. Allyson Sabal.  History of Present Illness:   Mr. Monje presented to the ED today 6/6 with worsening scrotal swelling, shortness of breath, and AMS. Wife reports they went to the beach last week. During that trip he experienced a fall for which he was briefly hospitalized [facility]). They returned home on Monday and he was evaluated by his PCP who stopped his Eliquis due to frequent falls and diffuse ecchymosis. Two days ago, he noticed he was running low on his lasix. He normally takes 80 mg twice daily. He cut this down to just 40 mg once daily. Yesterday he began to develop shortness of breath and swelling. Today his symptoms progressed and he became altered. Wife and son accompanied him to the ER.   On arrival to the ED patient was altered. He was hypercarbic on ABG with pH 7.269, PCO2 of 56, and PO2 of 156. CXR was consistent with pulmonary edema. He was afebrile and otherwise hemodynamically stable BP 122/53. BNP was elevated 1360. He was placed on BiPAP, given IV lasix 80 mg x 1, and admitted to SDU.   Past Medical History:  Diagnosis Date  . Arthritis    "feet" (09/30/2017)  . CHF (congestive heart failure) ([place])   . Chronic bronchitis ([place])   . CKD (chronic kidney disease), stage III ([place])    [name] 09/30/2017  . Coronary artery disease   . Diverticulitis   . GERD (gastroesophageal reflux disease)   .  Gout   . History of blood transfusion    "blood loss" (09/30/2017)  . Hyperlipidemia   . Hypertension   . MI (myocardial infarction) ([place]) ~ 2000   "light one"  . Nonischemic cardiomyopathy (HCC)    mild  . OSA on CPAP   . Permanent atrial fibrillation (H[facility])    on Eliquis  . Presence of permanent cardiac pacemaker   . Pulmonary hypertension ([place])   . Restless legs     Past Surgical History:  Procedure Laterality Date  . APPENDECTOMY  12/2000   [name] 12/22/2010  . CARDIAC CATHETERIZATION  11/07/2008   nonischemic cardiomyopathy,pulmonary hypertension  . CATARACT EXTRACTION W/ INTRAOCULAR LENS  IMPLANT, BILATERAL Bilateral   . CIRCUMCISION  12/2005   [name] 12/22/2010  . COLOSTOMY  12/2000   [name] 12/22/2010  . COLOSTOMY REVERSAL  07/2001   [name] 12/22/2010  . CORONARY ANGIOPLASTY  06/01/1999   successful to ostium of the first diagonal  . ESOPHAGOGASTRODUODENOSCOPY N/A 08/22/2013   Procedure: ESOPHAGOGASTRODUODENOSCOPY (EGD);  Surgeon: [name], MD;  Location: [facility] ENDOSCOPY;  Service: Endoscopy;  Laterality: N/A;  h/p in file cabinet, jackie  . ESOPHAGOGASTRODUODENOSCOPY N/A 11/05/2014   Procedure: ESOPHAGOGASTRODUODENOSCOPY (EGD);  Surgeon: Clarene Essex, MD;  Location: [facility] ENDOSCOPY;  Service: Endoscopy;  Laterality: N/A;  . ESOPHAGOGASTRODUODENOSCOPY N/A 11/08/2016   Procedure: ESOPHAGOGASTRODUODENOSCOPY (EGD);  Surgeon: [name], MD;  Location: [facility] ENDOSCOPY;  Service: Endoscopy;  Laterality: N/A;  . ESOPHAGOGASTRODUODENOSCOPY (EGD) WITH  PROPOFOL Left 11/05/2017   Procedure: ESOPHAGOGASTRODUODENOSCOPY (EGD) WITH PROPOFOL;  Surgeon: [name], MD;  Location: [facility];  Service: Endoscopy;  Laterality: Left;  . HOT HEMOSTASIS N/A 11/05/2014   Procedure: HOT HEMOSTASIS (ARGON PLASMA COAGULATION/BICAP);  Surgeon: Clarene Essex, MD;  Location: [facility] ENDOSCOPY;  Service: Endoscopy;  Laterality: N/A;  . INSERT / REPLACE / REMOVE PACEMAKER    . JOINT REPLACEMENT    . MASS  EXCISION Left    hand w/ulnar artery reconstruction/notes 12/22/2010  . NM MYOCAR PERF WALL MOTION  11/24/2007   normal  . PERMANENT PACEMAKER INSERTION  10/04/2012   [employer]  . PERMANENT PACEMAKER INSERTION N/A 10/12/2011   Procedure: PERMANENT PACEMAKER INSERTION;  Surgeon: [name], MD;  Location: [facility] CATH LAB;  Service: Cardiovascular;  Laterality: N/A;  . REPLACEMENT TOTAL KNEE Bilateral 11/2006   right-left/notes 12/22/2010  . ROTATOR CUFF REPAIR Right 09/2004   [name] 12/22/2010  . SAVORY DILATION N/A 08/22/2013   Procedure: SAVORY DILATION;  Surgeon: [name], MD;  Location: [name] ENDOSCOPY;  Service: Endoscopy;  Laterality: N/A;  . SHOULDER SURGERY Right    "fell off house; messed up 3 things in my arm"  . TONSILLECTOMY    . US ECHOCARDIOGRAPHY  02/01/2011   LA is mod-severely dilated,AOV & root sclerotic,ca+ AOV leaflets     Home Medications:  Prior to Admission medications   Medication Sig Start Date End Date Taking? Authorizing Provider  acetaminophen (TYLENOL) 650 MG CR tablet Take 650 mg by mouth every 8 (eight) hours as needed for pain.   Yes [provider]  allopurinol (ZYLOPRIM) 100 MG tablet Take 2 tablets (200 mg total) by mouth daily. 11/09/16  Yes Theodis Blaze, MD  colchicine 0.6 MG tablet Take 0.6 mg by mouth daily as needed (gout flare). Colcrys   Yes [provider]  Docusate Calcium (STOOL SOFTENER PO) Take 2 tablets by mouth at bedtime.    Yes [provider]  dutasteride (AVODART) 0.5 MG capsule Take 0.5 mg by mouth daily.   Yes [provider]  escitalopram (LEXAPRO) 5 MG tablet Take 1 tablet (5 mg total) by mouth at bedtime. 11/09/16  Yes Theodis Blaze, MD  ferrous sulfate 325 (65 FE) MG tablet Take 1 tablet (325 mg total) by mouth 2 (two) times daily with a meal. 11/14/17  Yes Eulia Hatcher, MD  furosemide (LASIX) 40 MG tablet Take 40-80 mg by mouth 2 (two) times daily. Take 80mg  by mouth in the morning and 40mg   in the afternoon    Yes [provider]  hydrOXYzine (ATARAX/VISTARIL) 25 MG tablet TAKE 1 TABLET BY MOUTH EVERY 6 HOURS AS NEEDED FOR  ITCHING 01/11/18  Yes Elizzie Westergard, MD  mirtazapine (REMERON) 15 MG tablet Take 15 mg by mouth at bedtime.   Yes [provider]  Multiple Vitamin (MULTIVITAMIN WITH MINERALS) TABS tablet Take 1 tablet by mouth daily.   Yes [provider]  Multiple Vitamins-Minerals (PRESERVISION AREDS 2) CAPS Take 1 capsule by mouth daily.   Yes [provider]  pantoprazole (PROTONIX) 40 MG tablet Take 1 tablet (40 mg total) by mouth daily. Patient taking differently: Take 40 mg by mouth 2 (two) times daily.  11/08/17  Yes Nita Sells, MD  potassium chloride (K-DUR) 10 MEQ tablet Take 1 tablet (10 mEq total) by mouth daily. 03/17/17  Yes Hendrix Console, MD  rOPINIRole (REQUIP) 3 MG tablet Take 3 mg by mouth at bedtime.  08/02/15  Yes [provider]  Tamsulosin HCl (FLOMAX) 0.4 MG CAPS Take 0.4 mg by mouth daily after breakfast.   Yes [provider]  traMADol (ULTRAM) 50 MG tablet Take 100 mg by mouth 2 (two) times daily. For pain    Yes [provider]  Amino Acids-Protein Hydrolys (FEEDING SUPPLEMENT, PRO-STAT SUGAR FREE 64,) LIQD Take 30 mLs by mouth 2 (two) times daily. Patient not taking: Reported on 01/12/2018 10/02/17   Jani Gravel, MD  apixaban (ELIQUIS) 5 MG TABS tablet Take 1 tablet (5 mg total) by mouth 2 (two) times daily. Patient taking differently: Take 2.5 mg by mouth 2 (two) times daily.  11/10/17   Nita Sells, MD    Inpatient Medications: Scheduled Meds: . furosemide  60 mg Intravenous BID  . guaiFENesin  600 mg Oral BID  . levalbuterol  0.63 mg Nebulization Q6H   Continuous Infusions: . cefTRIAXone (ROCEPHIN)  IV 1 g (01/12/18 1252)  . metronidazole Stopped (01/12/18 1254)   PRN Meds: acetaminophen **OR** acetaminophen, naLOXone (NARCAN)  injection, ondansetron **OR**  ondansetron (ZOFRAN) IV  Allergies:    Allergies  Allergen Reactions  . Vicodin [Hydrocodone-Acetaminophen] Itching and Nausea Only    Social History:   Social History   Socioeconomic History  . Marital status: Married    Spouse name: Not on file  . Number of children: Not on file  . Years of education: Not on file  . Highest education level: Not on file  Occupational History  . Occupation: retired  Scientific laboratory technician  . Financial resource strain: Not on file  . Food insecurity:    Worry: Not on file    Inability: Not on file  . Transportation needs:    Medical: Not on file    Non-medical: Not on file  Tobacco Use  . Smoking status: Former Research scientist (life sciences)  . Smokeless tobacco: Never Used  . Tobacco comment: 09/30/2017 "it's been over 81yr since I smoked anything"  Substance and Sexual Activity  . Alcohol use: No  . Drug use: No  . Sexual activity: Never  Lifestyle  . Physical activity:    Days per week: Not on file    Minutes per session: Not on file  . Stress: Not on file  Relationships  . Social connections:    Talks on phone: Not on file    Gets together: Not on file    Attends religious service: Not on file    Active member of club or organization: Not on file    Attends meetings of clubs or organizations: Not on file    Relationship status: Not on file  . Intimate partner violence:    Fear of current or ex partner: Not on file    Emotionally abused: Not on file    Physically abused: Not on file    Forced sexual activity: Not on file  Other Topics Concern  . Not on file  Social History Narrative  . Not on file    Family History:    Family History  Problem Relation Age of Onset  . Cancer Mother   . Heart attack Father   . Diabetes Brother      ROS:  Please see the history of present illness.  Review of Systems  Constitution: Negative for fever.  Cardiovascular: Negative for chest pain.  Respiratory: Positive for shortness of breath. Negative for cough.     Musculoskeletal: Positive for falls.  Gastrointestinal: Positive for abdominal pain and melena. Negative for diarrhea and hematochezia.  Genitourinary: Negative for  dysuria.  Psychiatric/Behavioral: Positive for altered mental status.   All other ROS reviewed and negative.     Physical Exam/Data:   Vitals:   01/12/18 1049 01/12/18 1100 01/12/18 1111 01/12/18 1303  BP:  (!) 113/58 (!) 113/58 123/68  Pulse:  60 60 60  Resp:  14 20 18   Temp:      TempSrc:      SpO2: 98% 97% 98% 100%    Intake/Output Summary (Last 24 hours) at 01/12/2018 1403 Last data filed at 01/12/2018 1209 Gross per 24 hour  Intake -  Output 1330 ml  Net -1330 ml   There were no vitals filed for this visit. There is no height or weight on file to calculate BMI.  General:  Well nourished, well developed, on BiPAP HEENT: normal Lymph: no adenopathy Neck: no JVD Endocrine:  No thryomegaly Vascular: No carotid bruits; FA pulses 2+ bilaterally without bruits  Cardiac:  normal S1, S2; RRR; no murmur  Lungs:  Ascultation limited on BiPAP, but clear anteriorly  Abd: soft, nontender, no hepatomegaly  Ext: no edema Musculoskeletal:  No deformities, BUE and BLE strength normal and equal Skin: warm and dry  Neuro:  CNs 2-12 intact, no focal abnormalities noted Psych:  Normal affect   EKG:  The EKG was personally reviewed and demonstrates:  Junctional rhythm  Telemetry:  N/A  Relevant CV Studies:  09/21/2017 Echocardiogram:  Study Conclusions - Left ventricle: The cavity size was moderately dilated. There was   mild concentric hypertrophy. Systolic function was severely   reduced. The estimated ejection fraction was in the range of 25%   to 30%. The study is not technically sufficient to allow   evaluation of LV diastolic function. - Mitral valve: There was mild regurgitation. - Left atrium: The atrium was moderately dilated. - Right ventricle: The cavity size was moderately dilated. - Right atrium: The  atrium was moderately dilated. - Atrial septum: No defect or patent foramen ovale was identified. - Tricuspid valve: There was severe regurgitation. - Pulmonary arteries: PA peak pressure: 111 mm Hg (S).  Notes recorded by [name], MD on 09/22/2017 at 4:42 PM  I disagree with the report. I do not think the EF is 25-30%, but rather 40-45%, close to his previous baseline. There is however profound dyssynchrony of contraction due to RV pacing (which is occurring with increasing frequency due to slow heart rates (although none of his meds slow the heart rate). The biggest problem remains severe pulmonary HTN and severe tricuspid insufficiency. These are related to obesity, OSA and probably the pacemaker lead across the tricuspid valve.  Laboratory Data:  Chemistry Recent Labs  Lab 01/12/18 0306  NA 142  K 4.1  CL 107  CO2 26  GLUCOSE 103*  BUN 55*  CREATININE 1.54*  CALCIUM 8.7*  GFRNONAA 40*  GFRAA 46*  ANIONGAP 9    No results for input(s): PROT, ALBUMIN, AST, ALT, ALKPHOS, BILITOT in the last 168 hours. Hematology Recent Labs  Lab 01/12/18 0306  WBC 4.3  RBC 2.26*  HGB 8.1*  HCT 25.8*  MCV 114.2*  MCH 35.8*  MCHC 31.4  RDW 17.6*  PLT 164   Cardiac Enzymes Recent Labs  Lab 01/12/18 0931  TROPONINI <0.03    Recent Labs  Lab 01/12/18 0323  TROPIPOC 0.01    BNP Recent Labs  Lab 01/12/18 0306  BNP 1,360.9*    DDimer No results for input(s): DDIMER in the last 168 hours.  Radiology/Studies:  Ct Abdomen  Pelvis Wo Contrast  Result Date: 01/10/2018 CLINICAL DATA:  Abdominal pain x 1-2 weeks, history of colostomy status post reversal EXAM: CT ABDOMEN AND PELVIS WITHOUT CONTRAST TECHNIQUE: Multidetector CT imaging of the abdomen and pelvis was performed following the standard protocol without IV contrast. COMPARISON:  03/01/2008 FINDINGS: Motion degraded images. Lower chest: Small right and trace left pleural effusions. Hepatobiliary: Unenhanced liver is  unremarkable. Gallbladder is unremarkable. No intrahepatic or extrahepatic ductal dilatation. Pancreas: Grossly unremarkable. Spleen: Within normal limits. Adrenals/Urinary Tract: Adrenal glands are within normal limits. Bilateral renal cysts, measuring up to 6.2 cm in the posterior interpolar left kidney. No renal calculi or hydronephrosis. Bladder is within normal limits. Stomach/Bowel: Stomach is within normal limits. No evidence of bowel obstruction. However, there is nonspecific stranding in the right upper abdomen which is favored to be localized along the duodenum (coronal image 58), favoring duodenitis over pancreatitis. Appendix is not discretely visualized and is reportedly surgically absent. Status post partial colectomy with suture line in the right mid abdomen (series 2/image 44). Scattered colonic diverticulosis, without evidence of diverticulitis. Vascular/Lymphatic: No evidence of abdominal aortic aneurysm. Atherosclerotic calcifications of the abdominal aorta and branch vessels. No suspicious abdominopelvic lymphadenopathy. Reproductive: Enlargement of the central gland, suggesting BPH. Other: Trace perihepatic ascites. Mild mesenteric fluid/stranding. Trace presacral fluid. Mild body wall edema. Postsurgical changes along the anterior abdominal wall. Small right paramidline ventral hernia containing a knuckle of small bowel (series 2/image 39). Small right and moderate left fat containing inguinal hernias, with trace fluid on the left. Musculoskeletal: Degenerative changes of the visualized thoracolumbar spine. IMPRESSION: Motion degraded images. Inflammatory changes centered in the right upper quadrant, favoring duodenitis, less likely pancreatitis. No evidence of bowel obstruction. Status post partial colectomy. Prior appendectomy. No peripancreatic fluid collections. Small right and trace left pleural effusions. Trace perihepatic and presacral ascites. Body wall edema. Electronically Signed    By: Julian Hy M.D.   On: 01/10/2018 13:09   Dg Chest 2 View  Result Date: 01/12/2018 CLINICAL DATA:  Shortness of breath. EXAM: CHEST - 2 VIEW COMPARISON:  11/03/2017 FINDINGS: Marked cardiomegaly, similar to prior exam. Single lead pacemaker remains in place. Fine interstitial opacities suggest pulmonary edema. Small bilateral pleural effusions. No focal airspace disease or pneumothorax. Chronic change about shoulders, the bones are under mineralized. IMPRESSION: Cardiomegaly. Increased interstitial opacities suggest pulmonary edema. Electronically Signed   By: Jeb Levering M.D.   On: 01/12/2018 03:26   Ct Head Wo Contrast  Result Date: 01/12/2018 CLINICAL DATA:  Headache EXAM: CT HEAD WITHOUT CONTRAST TECHNIQUE: Contiguous axial images were obtained from the base of the skull through the vertex without intravenous contrast. COMPARISON:  November 03, 2017 and April 05, 2017 FINDINGS: Brain: Mild diffuse atrophy is stable. There is no intracranial mass, hemorrhage, extra-axial fluid collection, or midline shift. There is patchy small vessel disease in the centra semiovale bilaterally. There is a small lacunar infarct in the posterior limb of the left internal capsule, stable. There is also a small prior lacunar infarct in the anterior limb of the left internal capsule. There is no appreciable new gray-white compartment lesion. No acute infarct is demonstrable. Vascular: There is no appreciable hyperdense vessel. There is calcification in each carotid siphon region. Skull: The bony calvarium a appears intact. Sinuses/Orbits: There is mucosal thickening in several ethmoid air cells. Other paranasal sinuses are clear. There is rightward deviation of the nasal septum. Orbits appear symmetric bilaterally. Other: Mastoid air cells are clear. IMPRESSION: Stable atrophy with supratentorial small  vessel disease. No evident acute infarct. No mass or hemorrhage. There are foci of arterial vascular  calcification. There is mucosal thickening in several ethmoid air cells. There is rightward deviation of the nasal septum. Electronically Signed   By: Lowella Grip III M.D.   On: 01/12/2018 08:42    Assessment and Plan:    Acute on Chronic Heart Failure: In the setting of decreased lasix dosing x 2 days. Patient presented with scrotal swelling, dyspnea, and AMS requiring BiPAP. Troponin has remained negative x 2. Patient has a history of right sided heart failure and PAH secondary to severe tricuspid regurgitation and OSA, with reduced systolic function (EF 47-42%) on most recent echo 09/21/17. Prior cath in 2010 without CAD. He has been unable to tolerate a beta blocker due to a-fib with slow ventricular response requiring pacemaker. Unable to tolerate ACEi/ARB due to low blood pressure and chronic anemia. He is managed soley with diuretics.  -- BiPAP / supplemental oxygen prn  -- S/p IV lasix 80 mg x 1; continue IV 60 mg BID -- Monitor renal function  -- I/Os, daily weights -- Telemetry   Persistent Atrial Fibrillation with slow ventricular response: HR 50s-60s currently. EKG without pacing spikes. Per last pacemaker interrogation, it appears ventricular pacing occurs 73% of time. Patient's wife reports Eliquis was discontinued by PCP on Monday. It has been ordered here. Will defer this to Dr. [name].  -- Continue telemetry  -- No beta blockers   CKD III: At baseline -- Daily BMP  OSA: Currently on BiPAP -- CPAP QHS when off BiPAP  Chronic Anemia: Hemoglobin 8.1 from 9.8 one month ago. Baseline 8.6-9.8. Patient reports melena but also on iron supplements.  -- Anemia work up pending (FOBT, anemia panel) -- EGD 3/30 with angiodysplasia   Acute Duodenitis: Seen on recent CT abdomen. Started on rocephin and flagyl. PPI.  -- per primary   For questions or updates, please contact [place] Please consult [email or web address] for contact info under Cardiology/STEMI.   Signed, Velna Ochs, MD  01/12/2018 2:03 PM   I have seen and examined the patient along with Dr. Velna Ochs, MD .  I have reviewed the chart, notes and new data.  I agree with her note.  Key new complaints: Mr. Donley is still very somnolent, although he can be woken easily with verbal stimuli.  He generally falls asleep before he finishes answering a question.  Note that Dr. Osborne Casco had decided that his recurrent falls placed him at excessive risk and discontinued his oral anticoagulant.  I agree with that assessment Key examination changes: He has mild scrotal edema and slight abdominal distention.  He has little pedal edema.  His lungs sound clear.  Very hard to see his jugular veins, especially with BiPAP on, but they do appear to be distended. Key new findings / data:  - Moderate hypercapnia and acute respiratory acidosis.   - Renal function substantially worse than baseline, although calculated GFR around 40.   - BNP is elevated, but not as high as it was in March.  Low risk cardiac enzymes.   - ECG nondiagnostic due to ventricular pacing.   - No intracranial bleeding on head CT.   - Chest x-ray does not look any different compared to the one performed in March in my opinion.   - Moderate stable pleural regenerative anemia with macrocytic indices, recently with mild reduction in blood sent WBC as well, although these are normal today.  PLAN: #1 problem seems  to be encephalopathy related to hypercapnia and respiratory acidosis.  He is improving with BiPAP.   He does have heart failure findings, but right heart failure abnormalities clearly dominate over any evidence of left heart failure. Heart failure exacerbation is related to noncompliance with diuretics, possibly increased sodium intake while at the beach, as well as anemia. Low level of suspicion for an acute coronary event. Left ventricular dysfunction is at least partly related to increased frequency of right ventricular pacing (73% of the  time at his last device check). Avoid beta-blockers or any other medications with negative chronotropic effect, which would further increase the degree of AV block and the need for ventricular pacing. Frequent falls and anemia are both reasons to discontinue chronic anticoagulation.  I agree with Dr. Osborne Casco: I do not think that he is a good candidate for long-term anticoagulation any longer.  While he is in the hospital and on bedrest, these medicines can be continued as they will offer benefit for DVT/PE prophylaxis.  I do not think this should be continued when he is discharged home On the other hand, his anemia appears to be related to macrocytosis and a bone marrow abnormality, rather than just iron deficiency.  Level was normal in February.  Consider evaluation for myelodysplasia and hematology consultation. I agree with the referral for palliative care consultation and I am a little disappointed that Mrs. Amburn did not spend more time talking with him.  [name], MD, [place] [phone] 01/12/2018, 4:31 PM

## 2018-01-12 NOTE — Progress Notes (Signed)
PMT note:   Met at bedside in ED with Mr. Yawn, his wife, and daughter. [place]. He had a fall while at the beach last week, and per his wife, he falls frequently. Upon introducing palliative medicine, wife states she wants care in his best interest and wants to keep him around as long as possible. Staff in to transfer patient.   Family meeting tomorrow at 3:00.

## 2018-01-12 NOTE — H&P (Addendum)
[facility] History and Physical  SHAYA ALTAMURA WPY:[id number] DOB: Jan 31, 1932 DOA: 01/12/2018  Referring physician: PCP: [name], MD   Chief Complaint: shortness of breath  HPI:  84 year old male with a history of chronic systolic heart failure EF 25-30% on most recent echo, atrial fibrillation on  Eliquis, chronic kidney disease stage III, coronary artery disease,obstructive sleep apnea on C Pap at home, pacemaker,who presents to the ED today because of worsening swelling scrotal edema, altered mental status patient's wife was currently in the room states that they have gone to the [place] last week which is 4 hours away for a week H&P. The returned home on Monday. Patient had been taking half the dose of his prescribed Lasix, to prevent from running out. Patient has been having intermittent melena and black tarry stools. He's also been having some abdominal pain. He has a history of ruptured diverticulitis in 2017. EGD in March 2016 showed erosive esophagitis.patient is to be ordered him to have a CT abdomen pelvis because of his abdominal pain that showed duodenitis, less likely pancreatitis. No evidence of small bowel obstruction. Patient's wife was trying to reach Dr. Watt Climes, his gastroenterologist to see if he can undergo enteroscopy. However patient continued to decline, gradually get more short of breath and presented to the ED ED course BP (!) 122/53   Pulse 66   Temp 97.7 F (36.5 C) (Oral)   Resp 20   SpO2 94%  Patient found to be obtunded and placed on BiPAP ABG shows a pH of 7.269, PCO2 of 56, PO2 156 Creatinine 1.54, BNP 1360, hemoglobin 8.1, troponin 0.01, CT head negative Patient is being admitted for hypercapnic hypoxic respiratory failure to stepdown     Review of Systems: negative for the following  Complete review of systems was done with pertinent positives as documented in history of present illness     Past Medical History:  Diagnosis Date  .  Arthritis    "feet" (09/30/2017)  . CHF (congestive heart failure) ([place])   . Chronic bronchitis ([place])   . CKD (chronic kidney disease), stage III ([place])    [name] 09/30/2017  . Coronary artery disease   . Diverticulitis   . GERD (gastroesophageal reflux disease)   . Gout   . History of blood transfusion    "blood loss" (09/30/2017)  . Hyperlipidemia   . Hypertension   . MI (myocardial infarction) ([place]) ~ 2000   "light one"  . Nonischemic cardiomyopathy (H[facility])    mild  . OSA on CPAP   . Permanent atrial fibrillation (H[facility])    on Eliquis  . Presence of permanent cardiac pacemaker   . Pulmonary hypertension ([place])   . Restless legs      Past Surgical History:  Procedure Laterality Date  . APPENDECTOMY  12/2000   [name] 12/22/2010  . CARDIAC CATHETERIZATION  11/07/2008   nonischemic cardiomyopathy,pulmonary hypertension  . CATARACT EXTRACTION W/ INTRAOCULAR LENS  IMPLANT, BILATERAL Bilateral   . CIRCUMCISION  12/2005   [name] 12/22/2010  . COLOSTOMY  12/2000   [name] 12/22/2010  . COLOSTOMY REVERSAL  07/2001   [name] 12/22/2010  . CORONARY ANGIOPLASTY  06/01/1999   successful to ostium of the first diagonal  . ESOPHAGOGASTRODUODENOSCOPY N/A 08/22/2013   Procedure: ESOPHAGOGASTRODUODENOSCOPY (EGD);  Surgeon: [name], MD;  Location: [facility] ENDOSCOPY;  Service: Endoscopy;  Laterality: N/A;  h/p in file cabinet, jackie  . ESOPHAGOGASTRODUODENOSCOPY N/A 11/05/2014   Procedure: ESOPHAGOGASTRODUODENOSCOPY (EGD);  Surgeon: Clarene Essex, MD;  Location:  [place] ENDOSCOPY;  Service: Endoscopy;  Laterality: N/A;  . ESOPHAGOGASTRODUODENOSCOPY N/A 11/08/2016   Procedure: ESOPHAGOGASTRODUODENOSCOPY (EGD);  Surgeon: [name], MD;  Location: [facility] ENDOSCOPY;  Service: Endoscopy;  Laterality: N/A;  . ESOPHAGOGASTRODUODENOSCOPY (EGD) WITH PROPOFOL Left 11/05/2017   Procedure: ESOPHAGOGASTRODUODENOSCOPY (EGD) WITH PROPOFOL;  Surgeon: [name], MD;  Location: [place];  Service: Endoscopy;   Laterality: Left;  . HOT HEMOSTASIS N/A 11/05/2014   Procedure: HOT HEMOSTASIS (ARGON PLASMA COAGULATION/BICAP);  Surgeon: Clarene Essex, MD;  Location: [facility] ENDOSCOPY;  Service: Endoscopy;  Laterality: N/A;  . INSERT / REPLACE / REMOVE PACEMAKER    . JOINT REPLACEMENT    . MASS EXCISION Left    hand w/ulnar artery reconstruction/notes 12/22/2010  . NM MYOCAR PERF WALL MOTION  11/24/2007   normal  . PERMANENT PACEMAKER INSERTION  10/04/2012   [employer]  . PERMANENT PACEMAKER INSERTION N/A 10/12/2011   Procedure: PERMANENT PACEMAKER INSERTION;  Surgeon: [name], MD;  Location: [facility] CATH LAB;  Service: Cardiovascular;  Laterality: N/A;  . REPLACEMENT TOTAL KNEE Bilateral 11/2006   right-left/notes 12/22/2010  . ROTATOR CUFF REPAIR Right 09/2004   [name] 12/22/2010  . SAVORY DILATION N/A 08/22/2013   Procedure: SAVORY DILATION;  Surgeon: [name], MD;  Location: [facility] ENDOSCOPY;  Service: Endoscopy;  Laterality: N/A;  . SHOULDER SURGERY Right    "fell off house; messed up 3 things in my arm"  . TONSILLECTOMY    . US ECHOCARDIOGRAPHY  02/01/2011   LA is mod-severely dilated,AOV & root sclerotic,ca+ AOV leaflets      Social History:  reports that he has quit smoking. He has never used smokeless tobacco. He reports that he does not drink alcohol or use drugs.   Allergies  Allergen Reactions  . Vicodin [Hydrocodone-Acetaminophen] Itching  . Hydrocodone Itching    Family History  Problem Relation Age of Onset  . Cancer Mother   . Heart attack Father   . Diabetes Brother      ,   Prior to Admission medications   Medication Sig Start Date End Date Taking? Authorizing Provider  acetaminophen (TYLENOL) 650 MG CR tablet Take 650 mg by mouth every 8 (eight) hours as needed for pain.    [provider]  allopurinol (ZYLOPRIM) 100 MG tablet Take 2 tablets (200 mg total) by mouth daily. 11/09/16   Theodis Blaze, MD  Amino Acids-Protein Hydrolys (FEEDING SUPPLEMENT, PRO-STAT  SUGAR FREE 64,) LIQD Take 30 mLs by mouth 2 (two) times daily. 10/02/17   Jani Gravel, MD  apixaban (ELIQUIS) 5 MG TABS tablet Take 1 tablet (5 mg total) by mouth 2 (two) times daily. Patient taking differently: Take 2.5 mg by mouth 2 (two) times daily.  11/10/17   Nita Sells, MD  colchicine 0.6 MG tablet Take 0.6 mg by mouth daily as needed (gout flare). Colcrys    [provider]  Docusate Calcium (STOOL SOFTENER PO) Take 2 tablets by mouth at bedtime.     [provider]  dutasteride (AVODART) 0.5 MG capsule Take 0.5 mg by mouth daily.    [provider]  escitalopram (LEXAPRO) 5 MG tablet Take 1 tablet (5 mg total) by mouth at bedtime. 11/09/16   Theodis Blaze, MD  ferrous sulfate 325 (65 FE) MG tablet Take 1 tablet (325 mg total) by mouth 2 (two) times daily with a meal. 11/14/17   Croitoru, Mihai, MD  furosemide (LASIX) 40 MG tablet Take 80mg  by mouth in the morning and 40mg  in  the afternoon    [provider]  hydrOXYzine (ATARAX/VISTARIL) 25 MG tablet TAKE 1 TABLET BY MOUTH EVERY 6 HOURS AS NEEDED FOR  ITCHING 01/11/18   Croitoru, Mihai, MD  mirtazapine (REMERON) 15 MG tablet Take 15 mg by mouth at bedtime.    [provider]  Multiple Vitamin (MULTIVITAMIN WITH MINERALS) TABS tablet Take 1 tablet by mouth daily.    [provider]  Multiple Vitamins-Minerals (PRESERVISION AREDS 2) CAPS Take 1 capsule by mouth daily.    [provider]  pantoprazole (PROTONIX) 40 MG tablet Take 1 tablet (40 mg total) by mouth daily. 11/08/17   Nita Sells, MD  potassium chloride (K-DUR) 10 MEQ tablet Take 1 tablet (10 mEq total) by mouth daily. Patient taking differently: Take 10 mEq by mouth 2 (two) times daily.  03/17/17   Croitoru, Mihai, MD  rOPINIRole (REQUIP) 3 MG tablet Take 3 mg by mouth at bedtime.  08/02/15   [provider]  Tamsulosin HCl (FLOMAX) 0.4 MG CAPS Take 0.4 mg by mouth daily after breakfast.    [provider]  traMADol (ULTRAM) 50 MG tablet Take 100 mg by mouth 2 (two) times daily. For pain     [provider]     Physical Exam: Vitals:   01/12/18 0705 01/12/18 0730 01/12/18 0917 01/12/18 0919  BP: 125/60 (!) 123/57 125/70   Pulse: 60 (!) 59 61   Resp: 20 18 15    Temp:    (!) 97 F (36.1 C)  TempSrc:    Axillary  SpO2: 95% (!) 79% 99%         Vitals:   01/12/18 0705 01/12/18 0730 01/12/18 0917 01/12/18 0919  BP: 125/60 (!) 123/57 125/70   Pulse: 60 (!) 59 61   Resp: 20 18 15    Temp:    (!) 97 F (36.1 C)  TempSrc:    Axillary  SpO2: 95% (!) 79% 99%    Constitutional: obtunded Eyes: PERRL, lids and conjunctivae normal ENMT: Mucous membranes are moist. Posterior pharynx clear of any exudate or lesions.Normal dentition.  Neck: normal, supple, no masses, no thyromegaly Respiratory: clear to auscultation bilaterally, no wheezing, no crackles. Normal respiratory effort. No accessory muscle use.  Cardiovascular: Regular rate and rhythm, no murmurs / rubs / gallops. No extremity edema. 2+ pedal pulses. No carotid bruits.  Abdomen: no tenderness, no masses palpated. No hepatosplenomegaly. Bowel sounds positive.  Musculoskeletal: no clubbing / cyanosis. No joint deformity upper and lower extremities. Good ROM, no contractures. Normal muscle tone.  Skin: no rashes, lesions, ulcers. No induration Neurologic: CN 2-12 unable to assess because of obtundation Sensation intact, DTR normal. Strength 5/5 in all 4.  Psychiatric: impaired judgment    Labs on Admission: I have personally reviewed following labs and imaging studies  CBC: Recent Labs  Lab 01/12/18 0306  WBC 4.3  HGB 8.1*  HCT 25.8*  MCV 114.2*  PLT 350    Basic Metabolic Panel: Recent Labs  Lab 01/12/18 0306  NA 142  K 4.1  CL 107  CO2 26  GLUCOSE 103*  BUN 55*  CREATININE 1.54*  CALCIUM 8.7*    GFR: CrCl cannot be calculated (Unknown ideal weight.).  Liver Function Tests: No  results for input(s): AST, ALT, ALKPHOS, BILITOT, PROT, ALBUMIN in the last 168 hours. No results for input(s): LIPASE, AMYLASE in the last 168 hours. No results for input(s): AMMONIA in the last 168 hours.  Coagulation Profile: No results for input(s): INR, PROTIME  in the last 168 hours. No results for input(s): DDIMER in the last 72 hours.  Cardiac Enzymes: No results for input(s): CKTOTAL, CKMB, CKMBINDEX, TROPONINI in the last 168 hours.  BNP (last 3 results) No results for input(s): PROBNP in the last 8760 hours.  HbA1C: No results for input(s): HGBA1C in the last 72 hours. Lab Results  Component Value Date   HGBA1C 4.5 (L) 11/03/2017     CBG: No results for input(s): GLUCAP in the last 168 hours.  Lipid Profile: No results for input(s): CHOL, HDL, LDLCALC, TRIG, CHOLHDL, LDLDIRECT in the last 72 hours.  Thyroid Function Tests: No results for input(s): TSH, T4TOTAL, FREET4, T3FREE, THYROIDAB in the last 72 hours.  Anemia Panel: No results for input(s): VITAMINB12, FOLATE, FERRITIN, TIBC, IRON, RETICCTPCT in the last 72 hours.  Urine analysis:    Component Value Date/Time   COLORURINE STRAW (A) 09/30/2017 1813   APPEARANCEUR CLEAR 09/30/2017 1813   LABSPEC 1.006 09/30/2017 1813   PHURINE 6.0 09/30/2017 1813   GLUCOSEU NEGATIVE 09/30/2017 1813   HGBUR NEGATIVE 09/30/2017 1813   BILIRUBINUR NEGATIVE 09/30/2017 1813   KETONESUR NEGATIVE 09/30/2017 1813   PROTEINUR NEGATIVE 09/30/2017 1813   NITRITE NEGATIVE 09/30/2017 1813   LEUKOCYTESUR NEGATIVE 09/30/2017 1813    Sepsis Labs: @LABRCNTIP (procalcitonin:4,lacticidven:4) )No results found for this or any previous visit (from the past 240 hour(s)).       Radiological Exams on Admission: Ct Abdomen Pelvis Wo Contrast  Result Date: 01/10/2018 CLINICAL DATA:  Abdominal pain x 1-2 weeks, history of colostomy status post reversal EXAM: CT ABDOMEN AND PELVIS WITHOUT CONTRAST TECHNIQUE: Multidetector CT imaging of  the abdomen and pelvis was performed following the standard protocol without IV contrast. COMPARISON:  03/01/2008 FINDINGS: Motion degraded images. Lower chest: Small right and trace left pleural effusions. Hepatobiliary: Unenhanced liver is unremarkable. Gallbladder is unremarkable. No intrahepatic or extrahepatic ductal dilatation. Pancreas: Grossly unremarkable. Spleen: Within normal limits. Adrenals/Urinary Tract: Adrenal glands are within normal limits. Bilateral renal cysts, measuring up to 6.2 cm in the posterior interpolar left kidney. No renal calculi or hydronephrosis. Bladder is within normal limits. Stomach/Bowel: Stomach is within normal limits. No evidence of bowel obstruction. However, there is nonspecific stranding in the right upper abdomen which is favored to be localized along the duodenum (coronal image 58), favoring duodenitis over pancreatitis. Appendix is not discretely visualized and is reportedly surgically absent. Status post partial colectomy with suture line in the right mid abdomen (series 2/image 44). Scattered colonic diverticulosis, without evidence of diverticulitis. Vascular/Lymphatic: No evidence of abdominal aortic aneurysm. Atherosclerotic calcifications of the abdominal aorta and branch vessels. No suspicious abdominopelvic lymphadenopathy. Reproductive: Enlargement of the central gland, suggesting BPH. Other: Trace perihepatic ascites. Mild mesenteric fluid/stranding. Trace presacral fluid. Mild body wall edema. Postsurgical changes along the anterior abdominal wall. Small right paramidline ventral hernia containing a knuckle of small bowel (series 2/image 39). Small right and moderate left fat containing inguinal hernias, with trace fluid on the left. Musculoskeletal: Degenerative changes of the visualized thoracolumbar spine. IMPRESSION: Motion degraded images. Inflammatory changes centered in the right upper quadrant, favoring duodenitis, less likely pancreatitis. No  evidence of bowel obstruction. Status post partial colectomy. Prior appendectomy. No peripancreatic fluid collections. Small right and trace left pleural effusions. Trace perihepatic and presacral ascites. Body wall edema. Electronically Signed   By: Julian Hy M.D.   On: 01/10/2018 13:09   Dg Chest 2 View  Result Date: 01/12/2018 CLINICAL DATA:  Shortness of breath. EXAM: CHEST - 2  VIEW COMPARISON:  11/03/2017 FINDINGS: Marked cardiomegaly, similar to prior exam. Single lead pacemaker remains in place. Fine interstitial opacities suggest pulmonary edema. Small bilateral pleural effusions. No focal airspace disease or pneumothorax. Chronic change about shoulders, the bones are under mineralized. IMPRESSION: Cardiomegaly. Increased interstitial opacities suggest pulmonary edema. Electronically Signed   By: Jeb Levering M.D.   On: 01/12/2018 03:26   Ct Head Wo Contrast  Result Date: 01/12/2018 CLINICAL DATA:  Headache EXAM: CT HEAD WITHOUT CONTRAST TECHNIQUE: Contiguous axial images were obtained from the base of the skull through the vertex without intravenous contrast. COMPARISON:  November 03, 2017 and April 05, 2017 FINDINGS: Brain: Mild diffuse atrophy is stable. There is no intracranial mass, hemorrhage, extra-axial fluid collection, or midline shift. There is patchy small vessel disease in the centra semiovale bilaterally. There is a small lacunar infarct in the posterior limb of the left internal capsule, stable. There is also a small prior lacunar infarct in the anterior limb of the left internal capsule. There is no appreciable new gray-white compartment lesion. No acute infarct is demonstrable. Vascular: There is no appreciable hyperdense vessel. There is calcification in each carotid siphon region. Skull: The bony calvarium a appears intact. Sinuses/Orbits: There is mucosal thickening in several ethmoid air cells. Other paranasal sinuses are clear. There is rightward deviation of the nasal  septum. Orbits appear symmetric bilaterally. Other: Mastoid air cells are clear. IMPRESSION: Stable atrophy with supratentorial small vessel disease. No evident acute infarct. No mass or hemorrhage. There are foci of arterial vascular calcification. There is mucosal thickening in several ethmoid air cells. There is rightward deviation of the nasal septum. Electronically Signed   By: Lowella Grip III M.D.   On: 01/12/2018 08:42   Ct Abdomen Pelvis Wo Contrast  Result Date: 01/10/2018 CLINICAL DATA:  Abdominal pain x 1-2 weeks, history of colostomy status post reversal EXAM: CT ABDOMEN AND PELVIS WITHOUT CONTRAST TECHNIQUE: Multidetector CT imaging of the abdomen and pelvis was performed following the standard protocol without IV contrast. COMPARISON:  03/01/2008 FINDINGS: Motion degraded images. Lower chest: Small right and trace left pleural effusions. Hepatobiliary: Unenhanced liver is unremarkable. Gallbladder is unremarkable. No intrahepatic or extrahepatic ductal dilatation. Pancreas: Grossly unremarkable. Spleen: Within normal limits. Adrenals/Urinary Tract: Adrenal glands are within normal limits. Bilateral renal cysts, measuring up to 6.2 cm in the posterior interpolar left kidney. No renal calculi or hydronephrosis. Bladder is within normal limits. Stomach/Bowel: Stomach is within normal limits. No evidence of bowel obstruction. However, there is nonspecific stranding in the right upper abdomen which is favored to be localized along the duodenum (coronal image 58), favoring duodenitis over pancreatitis. Appendix is not discretely visualized and is reportedly surgically absent. Status post partial colectomy with suture line in the right mid abdomen (series 2/image 44). Scattered colonic diverticulosis, without evidence of diverticulitis. Vascular/Lymphatic: No evidence of abdominal aortic aneurysm. Atherosclerotic calcifications of the abdominal aorta and branch vessels. No suspicious abdominopelvic  lymphadenopathy. Reproductive: Enlargement of the central gland, suggesting BPH. Other: Trace perihepatic ascites. Mild mesenteric fluid/stranding. Trace presacral fluid. Mild body wall edema. Postsurgical changes along the anterior abdominal wall. Small right paramidline ventral hernia containing a knuckle of small bowel (series 2/image 39). Small right and moderate left fat containing inguinal hernias, with trace fluid on the left. Musculoskeletal: Degenerative changes of the visualized thoracolumbar spine. IMPRESSION: Motion degraded images. Inflammatory changes centered in the right upper quadrant, favoring duodenitis, less likely pancreatitis. No evidence of bowel obstruction. Status post partial colectomy. Prior appendectomy.  No peripancreatic fluid collections. Small right and trace left pleural effusions. Trace perihepatic and presacral ascites. Body wall edema. Electronically Signed   By: Julian Hy M.D.   On: 01/10/2018 13:09   Dg Chest 2 View  Result Date: 01/12/2018 CLINICAL DATA:  Shortness of breath. EXAM: CHEST - 2 VIEW COMPARISON:  11/03/2017 FINDINGS: Marked cardiomegaly, similar to prior exam. Single lead pacemaker remains in place. Fine interstitial opacities suggest pulmonary edema. Small bilateral pleural effusions. No focal airspace disease or pneumothorax. Chronic change about shoulders, the bones are under mineralized. IMPRESSION: Cardiomegaly. Increased interstitial opacities suggest pulmonary edema. Electronically Signed   By: Jeb Levering M.D.   On: 01/12/2018 03:26   Ct Head Wo Contrast  Result Date: 01/12/2018 CLINICAL DATA:  Headache EXAM: CT HEAD WITHOUT CONTRAST TECHNIQUE: Contiguous axial images were obtained from the base of the skull through the vertex without intravenous contrast. COMPARISON:  November 03, 2017 and April 05, 2017 FINDINGS: Brain: Mild diffuse atrophy is stable. There is no intracranial mass, hemorrhage, extra-axial fluid collection, or midline  shift. There is patchy small vessel disease in the centra semiovale bilaterally. There is a small lacunar infarct in the posterior limb of the left internal capsule, stable. There is also a small prior lacunar infarct in the anterior limb of the left internal capsule. There is no appreciable new gray-white compartment lesion. No acute infarct is demonstrable. Vascular: There is no appreciable hyperdense vessel. There is calcification in each carotid siphon region. Skull: The bony calvarium a appears intact. Sinuses/Orbits: There is mucosal thickening in several ethmoid air cells. Other paranasal sinuses are clear. There is rightward deviation of the nasal septum. Orbits appear symmetric bilaterally. Other: Mastoid air cells are clear. IMPRESSION: Stable atrophy with supratentorial small vessel disease. No evident acute infarct. No mass or hemorrhage. There are foci of arterial vascular calcification. There is mucosal thickening in several ethmoid air cells. There is rightward deviation of the nasal septum. Electronically Signed   By: Lowella Grip III M.D.   On: 01/12/2018 08:42      EKG: Independently reviewed. Normal sinus rhythm  Assessment/Plan  Acute encephalopathy secondary to hypercapnic respiratory failure Appears multifactorial likely secondary to congestive heart failure,compounded by a obstructive sleep apnea and morbid obesity doubt infection, such as UTI or PNA     Acute on chronic congestive heart failure,  BNP is 1360, troponin negative Most recent 2-D echo 09/21/17 shows EF 25-30% Profound  Dyssynchrony of contraction due to RV pacing, severe pulmonary hypertension severe tricuspid regurgitation currently on BiPAP, being admitted to step down will repeat echo to clarify what the patient's ejection fraction is Cardiology has been requested to see the patient Diurese with IV Lasix and monitor renal function closely  Acute duodenitis No evidence of GI bleeding Given abdominal  pain, will start patient on rocephin  and Flagyl Doubt patient will need endoscopic evaluation in the setting of acute infection and respiratory failure therefore GI has not been notified No diarrhea   Anemia of chronic disease next and baseline hemoglobin around 8.6-9.8 Hemoglobin is 8.1 FOBT Anemia panel EGD 3/30 angiodysplasia status post fulguration, hiatal hernia-no biopsies done  Chronic kidney disease stage III, baseline creatinine around 1.4 Continue Lasix with cautious diuresis  BPH-continue Flomax  History of dementia hold Lexapro and Remeron in the setting of altered mental status   Atrial fibrillation, persistent, slow VR, currently in normal sinus rhythm on Eliquis, not on any rate lowering medications   OSA on CPAP-patient uses CPap  at home    DVT prophylaxis: eliquis      Code Status Orders full   (From admission, onward)        Start     Ordered   01/12/18 0919  Full code  Continuous     01/12/18 0920        consults called:[facility] cardiology  Family Communication: Admission, patients condition and plan of care including tests being ordered have been discussed with the patient  who indicates understanding and agree with the plan and Code Status   Admission status:  The appropriate patient status for this patient is INPATIENT. Inpatient status is judged to be reasonable and necessary in order to provide the required intensity of service to ensure the patient's safety. The patient's presenting symptoms, physical exam findings, and initial radiographic and laboratory data in the context of their chronic comorbidities is felt to place them at high risk for further clinical deterioration. Furthermore, it is not anticipated that the patient will be medically stable for discharge from the hospital within 2 midnights of admission. The following factors support the patient status of inpatient.    "           The patient's presenting symptoms include low back pain. "            The worrisome physical exam findings include and inability to walk. "           The initial radiographic and laboratory data are worrisome because of sacral fracture on CT. "           The chronic co-morbidities include history of hypertension.     * I certify that at the point of admission it is my clinical judgment that the patient will require inpatient hospital care spanning beyond 2 midnights from the point of admission due to high intensity of service, high risk for further deterioration and high frequency of surveillance required.*    Disposition plan: Further plan will depend as patient's clinical course evolves and further radiologic and laboratory data become available. Likely home when stable      Reyne Dumas MD [facility] Pager [phone]  If 7PM-7AM, please contact night-coverage [email or web address] Password [email or web address]  01/12/2018, 9:54 AM

## 2018-01-12 NOTE — ED Provider Notes (Signed)
[name] EMERGENCY DEPARTMENT Provider Note   CSN: [id number] Arrival date & time: 01/12/18  0240     History   Chief Complaint Chief Complaint  Patient presents with  . Congestive Heart Failure  . Groin Swelling    HPI [name] is a 84 y.o. male possible history of CHF, CKD, CAD, diverticulitis, GERD who presents for evaluation of multiple complaints, primarily scrotal swelling and difficulty breathing.  Family states that this is been ongoing since for the last 2 days.  They reports that the scrotal swelling has become worse and more painful.  He states he is still been able to urinate but does report some pain with urination.  Additionally, they report that his shortness of breath is worse.  He states that he has become more short of breath walking from room to room.  They state that he has not been taking his Lasix as needed.  They report that this usually happens whenever he does not take his Lasix.  Family denies any fevers, abdominal pain, nausea/vomiting.  Patient is sleeping.  Hard to arouse to get history.  Most history obtained by family.  The history is provided by a relative. The history is limited by the condition of the patient.    Past Medical History:  Diagnosis Date  . Arthritis    "feet" (09/30/2017)  . CHF (congestive heart failure) ([place])   . Chronic bronchitis ([place])   . CKD (chronic kidney disease), stage III ([place])    [name] 09/30/2017  . Coronary artery disease   . Diverticulitis   . GERD (gastroesophageal reflux disease)   . Gout   . History of blood transfusion    "blood loss" (09/30/2017)  . Hyperlipidemia   . Hypertension   . MI (myocardial infarction) ([place]) ~ 2000   "light one"  . Nonischemic cardiomyopathy (HCC)    mild  . OSA on CPAP   . Permanent atrial fibrillation (HCC)    on Eliquis  . Presence of permanent cardiac pacemaker   . Pulmonary hypertension ([place])   . Restless legs     Patient Active Problem List     Diagnosis Date Noted  . CHF exacerbation ([place]) 01/12/2018  . Hemorrhagic shock ([place])   . GI bleed 11/03/2017  . Acute renal failure superimposed on stage 3 chronic kidney disease ([place]) 11/03/2017  . Chest pain 11/03/2017  . Demand ischemia ([place])   . CHF (congestive heart failure) ([place]) 09/30/2017  . Pancytopenia ([place]) 09/26/2017  . CKD (chronic kidney disease), stage III ([place]) 09/26/2017  . Chronic pain 09/26/2017  . Acute respiratory failure with hypoxia ([place]) 09/26/2017  . Right shoulder injury, initial encounter 04/08/2017  . Chronic atrial fibrillation ([place]) 11/23/2016  . Acute upper GI bleeding 11/23/2016  . Acute delirium 11/07/2016  . Acute confusion   . Acute hyperkalemia   . Secondary hypercoagulable state ([place])   . Transaminitis   . PAH (pulmonary artery hypertension) ([place])   . Acute renal insufficiency 11/03/2016  . Chronic systolic heart failure ([place]) 09/09/2015  . Poor short term memory 11/05/2013  . Fatigue 10/13/2011  . Dyspnea on exertion 10/13/2011  . Atrial fibrillation, persistent, slow VR 10/13/2011  . Pacemaker single chamber, [place], 2013 10/13/2011  . Restless leg syndrome 10/13/2011  . Chronic anticoagulation 10/13/2011  . Nonischemic cardiomyopathy - coronaries by angiography 2010 10/13/2011  . OSA on CPAP 10/13/2011  . Tremor, hereditary, benign 10/13/2011  . Depression 10/13/2011  . Other secondary pulmonary  hypertension ([place]) 10/13/2011  . MRSA (methicillin resistant Staphylococcus aureus) carrier 10/13/2011  . BPH (benign prostatic hyperplasia) 10/13/2011    Past Surgical History:  Procedure Laterality Date  . APPENDECTOMY  12/2000   [name] 12/22/2010  . CARDIAC CATHETERIZATION  11/07/2008   nonischemic cardiomyopathy,pulmonary hypertension  . CATARACT EXTRACTION W/ INTRAOCULAR LENS  IMPLANT, BILATERAL Bilateral   . CIRCUMCISION  12/2005   [name] 12/22/2010  . COLOSTOMY  12/2000   [name] 12/22/2010  . COLOSTOMY REVERSAL   07/2001   [name] 12/22/2010  . CORONARY ANGIOPLASTY  06/01/1999   successful to ostium of the first diagonal  . ESOPHAGOGASTRODUODENOSCOPY N/A 08/22/2013   Procedure: ESOPHAGOGASTRODUODENOSCOPY (EGD);  Surgeon: [name], MD;  Location: [facility] ENDOSCOPY;  Service: Endoscopy;  Laterality: N/A;  h/p in file cabinet, jackie  . ESOPHAGOGASTRODUODENOSCOPY N/A 11/05/2014   Procedure: ESOPHAGOGASTRODUODENOSCOPY (EGD);  Surgeon: Clarene Essex, MD;  Location[facility] ENDOSCOPY;  Service: Endoscopy;  Laterality: N/A;  . ESOPHAGOGASTRODUODENOSCOPY N/A 11/08/2016   Procedure: ESOPHAGOGASTRODUODENOSCOPY (EGD);  Surgeon: [name], MD;  Location[facility] END[facility]COPY;  Service: Endoscopy;  Laterality: N/A;  . ESOPHAGOGASTRODUODENOSCOPY (EGD) WITH PROPOFOL Left 11/05/2017   Procedure: ESOPHAGOGASTRODUODENOSCOPY (EGD) WITH PROPOFOL;  Surgeon: [name], MD;  Location: [facility];  Service: Endoscopy;  Laterality: Left;  . HOT HEMOSTASIS N/A 11/05/2014   Procedure: HOT HEMOSTASIS (ARGON PLASMA COAGULATION/BICAP);  Surgeon: Clarene Essex, MD;  Location: [facility] ENDOSCOPY;  Service: Endoscopy;  Laterality: N/A;  . INSERT / REPLACE / REMOVE PACEMAKER    . JOINT REPLACEMENT    . MASS EXCISION Left    hand w/ulnar artery reconstruction/notes 12/22/2010  . NM MYOCAR PERF WALL MOTION  11/24/2007   normal  . PERMANENT PACEMAKER INSERTION  10/04/2012   [employer]  . PERMANENT PACEMAKER INSERTION N/A 10/12/2011   Procedure: PERMANENT PACEMAKER INSERTION;  Surgeon: [name], MD;  Location: [facility] CATH LAB;  Service: Cardiovascular;  Laterality: N/A;  . REPLACEMENT TOTAL KNEE Bilateral 11/2006   right-left/notes 12/22/2010  . ROTATOR CUFF REPAIR Right 09/2004   [name] 12/22/2010  . SAVORY DILATION N/A 08/22/2013   Procedure: SAVORY DILATION;  Surgeon: [name], MD;  Location: [facility] ENDOSCOPY;  Service: Endoscopy;  Laterality: N/A;  . SHOULDER SURGERY Right    "fell off house; messed up 3 things in my arm"  . TONSILLECTOMY    .  US ECHOCARDIOGRAPHY  02/01/2011   LA is mod-severely dilated,AOV & root sclerotic,ca+ AOV leaflets        Home Medications    Prior to Admission medications   Medication Sig Start Date End Date Taking? Authorizing Provider  acetaminophen (TYLENOL) 650 MG CR tablet Take 650 mg by mouth every 8 (eight) hours as needed for pain.   Yes [provider]  allopurinol (ZYLOPRIM) 100 MG tablet Take 2 tablets (200 mg total) by mouth daily. 11/09/16  Yes Theodis Blaze, MD  colchicine 0.6 MG tablet Take 0.6 mg by mouth daily as needed (gout flare). Colcrys   Yes [provider]  Docusate Calcium (STOOL SOFTENER PO) Take 2 tablets by mouth at bedtime.    Yes [provider]  dutasteride (AVODART) 0.5 MG capsule Take 0.5 mg by mouth daily.   Yes [provider]  escitalopram (LEXAPRO) 5 MG tablet Take 1 tablet (5 mg total) by mouth at bedtime. 11/09/16  Yes Theodis Blaze, MD  ferrous sulfate 325 (65 FE) MG tablet Take 1 tablet (325 mg total) by mouth 2 (two) times daily with a meal. 11/14/17  Yes Croitoru, Mihai, MD  furosemide (LASIX) 40 MG tablet Take 40-80 mg by mouth 2 (two) times daily. Take 80mg  by mouth in the morning and 40mg  in the afternoon    Yes [provider]  hydrOXYzine (ATARAX/VISTARIL) 25 MG tablet TAKE 1 TABLET BY MOUTH EVERY 6 HOURS AS NEEDED FOR  ITCHING 01/11/18  Yes Croitoru, Mihai, MD  mirtazapine (REMERON) 15 MG tablet Take 15 mg by mouth at bedtime.   Yes [provider]  Multiple Vitamin (MULTIVITAMIN WITH MINERALS) TABS tablet Take 1 tablet by mouth daily.   Yes [provider]  Multiple Vitamins-Minerals (PRESERVISION AREDS 2) CAPS Take 1 capsule by mouth daily.   Yes [provider]  pantoprazole (PROTONIX) 40 MG tablet Take 1 tablet (40 mg total) by mouth daily. Patient taking differently: Take 40 mg by mouth 2 (two) times daily.  11/08/17  Yes Nita Sells, MD  potassium chloride (K-DUR) 10 MEQ tablet  Take 1 tablet (10 mEq total) by mouth daily. 03/17/17  Yes Croitoru, Mihai, MD  rOPINIRole (REQUIP) 3 MG tablet Take 3 mg by mouth at bedtime.  08/02/15  Yes [provider]  Tamsulosin HCl (FLOMAX) 0.4 MG CAPS Take 0.4 mg by mouth daily after breakfast.   Yes [provider]  traMADol (ULTRAM) 50 MG tablet Take 100 mg by mouth 2 (two) times daily. For pain    Yes [provider]  Amino Acids-Protein Hydrolys (FEEDING SUPPLEMENT, PRO-STAT SUGAR FREE 64,) LIQD Take 30 mLs by mouth 2 (two) times daily. Patient not taking: Reported on 01/12/2018 10/02/17   Jani Gravel, MD  apixaban (ELIQUIS) 5 MG TABS tablet Take 1 tablet (5 mg total) by mouth 2 (two) times daily. Patient taking differently: Take 2.5 mg by mouth 2 (two) times daily.  11/10/17   Nita Sells, MD    Family History Family History  Problem Relation Age of Onset  . Cancer Mother   . Heart attack Father   . Diabetes Brother     Social History Social History   Tobacco Use  . Smoking status: Former Research scientist (life sciences)  . Smokeless tobacco: Never Used  . Tobacco comment: 09/30/2017 "it's been over 39yr since I smoked anything"  Substance Use Topics  . Alcohol use: No  . Drug use: No     Allergies   Vicodin [hydrocodone-acetaminophen]   Review of Systems Review of Systems  Constitutional: Negative for fever.  Eyes: Negative for visual disturbance.  Respiratory: Positive for shortness of breath. Negative for cough.   Cardiovascular: Negative for chest pain.  Gastrointestinal: Negative for abdominal pain, nausea and vomiting.  Genitourinary: Positive for scrotal swelling.  Neurological: Negative for weakness, numbness and headaches.  All other systems reviewed and are negative.    Physical Exam Updated Vital Signs BP 123/68   Pulse 60   Temp (!) 97 F (36.1 C) (Axillary)   Resp 18   SpO2 97%   Physical Exam  Constitutional: He appears well-developed and well-nourished. He appears lethargic.    Sleeping on exam.  Will occasionally arouse to verbal stimuli.  Arousable to sternal rub.  HENT:  Head: Normocephalic and atraumatic.  Mouth/Throat: Oropharynx is clear and moist and mucous membranes are normal.  Eyes: Pupils are equal, round, and reactive to light. Conjunctivae, EOM and lids are normal.  Neck: Full passive range of motion without pain.  Cardiovascular: Normal rate, regular rhythm, normal heart sounds and normal pulses. Exam reveals no gallop and no friction rub.  No murmur heard. Pulmonary/Chest:  Effort normal.  Crackles noted to bilateral lower lung fields.  Abdominal: Soft. Normal appearance. There is no tenderness. There is no rigidity and no guarding. Hernia confirmed negative in the right inguinal area and confirmed negative in the left inguinal area.  Genitourinary: Penis normal. Right testis shows swelling. Right testis shows no tenderness. Left testis shows swelling. Left testis shows no tenderness. Circumcised.  Genitourinary Comments: Diffuse girdle swelling.  No overlying warmth, erythema.  No tenderness noted.  Musculoskeletal: Normal range of motion.  Neurological: He appears lethargic.  Sleeping on initial examination.  Will occasionally arouse to verbal stimuli.  Easily arousable to sternal rub.  Moving all extremities.  Follows commands when awake.  Skin: Skin is warm and dry. Capillary refill takes less than 2 seconds.  Psychiatric: He has a normal mood and affect. His speech is normal.  Nursing note and vitals reviewed.    ED Treatments / Results  Labs (all labs ordered are listed, but only abnormal results are displayed) Labs Reviewed  BASIC METABOLIC PANEL - Abnormal; Notable for the following components:      Result Value   Glucose, Bld 103 (*)    BUN 55 (*)    Creatinine, Ser 1.54 (*)    Calcium 8.7 (*)    GFR calc non Af Amer 40 (*)    GFR calc Af Amer 46 (*)    All other components within normal limits  CBC - Abnormal; Notable for the  following components:   RBC 2.26 (*)    Hemoglobin 8.1 (*)    HCT 25.8 (*)    MCV 114.2 (*)    MCH 35.8 (*)    RDW 17.6 (*)    All other components within normal limits  BRAIN NATRIURETIC PEPTIDE - Abnormal; Notable for the following components:   B Natriuretic Peptide 1,360.9 (*)    All other components within normal limits  AMMONIA - Abnormal; Notable for the following components:   Ammonia 41 (*)    All other components within normal limits  I-STAT VENOUS BLOOD GAS, ED - Abnormal; Notable for the following components:   pH, Ven 7.235 (*)    pO2, Ven 74.0 (*)    Acid-base deficit 3.0 (*)    All other components within normal limits  I-STAT ARTERIAL BLOOD GAS, ED - Abnormal; Notable for the following components:   pH, Arterial 7.269 (*)    pCO2 arterial 56.1 (*)    pO2, Arterial 156.0 (*)    All other components within normal limits  MAGNESIUM  TROPONIN I  HEMOGLOBIN A1C  URINALYSIS, ROUTINE W REFLEX MICROSCOPIC  BLOOD GAS, VENOUS  BLOOD GAS, ARTERIAL  TROPONIN I  TROPONIN I  VITAMIN B12  FOLATE  IRON AND TIBC  FERRITIN  RETICULOCYTES  BLOOD GAS, ARTERIAL  I-STAT TROPONIN, ED    EKG EKG Interpretation  Date/Time:  Thursday January 12 2018 12:09:03 EDT Ventricular Rate:  60 PR Interval:    QRS Duration: 217 QT Interval:  543 QTC Calculation: 543 R Axis:   -79 Text Interpretation:  Junctional rhythm Nonspecific IVCD with LAD LVH with secondary repolarization abnormality Inferior infarct, acute (RCA) Anterior infarct, old Lateral leads are also involved Probable RV involvement, suggest recording right precordial leads No significant change since last tracing Confirmed by Lacretia Leigh ([id number]) on 01/12/2018 1:19:58 PM   Radiology Dg Chest 2 View  Result Date: 01/12/2018 CLINICAL DATA:  Shortness of breath. EXAM: CHEST - 2 VIEW COMPARISON:  11/03/2017 FINDINGS: Marked cardiomegaly, similar to prior  exam. Single lead pacemaker remains in place. Fine interstitial  opacities suggest pulmonary edema. Small bilateral pleural effusions. No focal airspace disease or pneumothorax. Chronic change about shoulders, the bones are under mineralized. IMPRESSION: Cardiomegaly. Increased interstitial opacities suggest pulmonary edema. Electronically Signed   By: Jeb Levering M.D.   On: 01/12/2018 03:26   Ct Head Wo Contrast  Result Date: 01/12/2018 CLINICAL DATA:  Headache EXAM: CT HEAD WITHOUT CONTRAST TECHNIQUE: Contiguous axial images were obtained from the base of the skull through the vertex without intravenous contrast. COMPARISON:  November 03, 2017 and April 05, 2017 FINDINGS: Brain: Mild diffuse atrophy is stable. There is no intracranial mass, hemorrhage, extra-axial fluid collection, or midline shift. There is patchy small vessel disease in the centra semiovale bilaterally. There is a small lacunar infarct in the posterior limb of the left internal capsule, stable. There is also a small prior lacunar infarct in the anterior limb of the left internal capsule. There is no appreciable new gray-white compartment lesion. No acute infarct is demonstrable. Vascular: There is no appreciable hyperdense vessel. There is calcification in each carotid siphon region. Skull: The bony calvarium a appears intact. Sinuses/Orbits: There is mucosal thickening in several ethmoid air cells. Other paranasal sinuses are clear. There is rightward deviation of the nasal septum. Orbits appear symmetric bilaterally. Other: Mastoid air cells are clear. IMPRESSION: Stable atrophy with supratentorial small vessel disease. No evident acute infarct. No mass or hemorrhage. There are foci of arterial vascular calcification. There is mucosal thickening in several ethmoid air cells. There is rightward deviation of the nasal septum. Electronically Signed   By: Lowella Grip III M.D.   On: 01/12/2018 08:42    Procedures .Critical Care Performed by: Volanda Napoleon, PA-C Authorized by: Volanda Napoleon, PA-C   Critical care provider statement:    Critical care time (minutes):  35   Critical care time was exclusive of:  Separately billable procedures and treating other patients   Critical care was necessary to treat or prevent imminent or life-threatening deterioration of the following conditions:  Cardiac failure, renal failure, circulatory failure, CNS failure or compromise, sepsis and respiratory failure   Critical care was time spent personally by me on the following activities:  Blood draw for specimens, ordering and performing treatments and interventions, ordering and review of laboratory studies, pulse oximetry, re-evaluation of patient's condition and evaluation of patient's response to treatment   (including critical care time)  Medications Ordered in ED Medications  naloxone (NARCAN) injection 0.4 mg (has no administration in time range)  acetaminophen (TYLENOL) tablet 650 mg (has no administration in time range)    Or  acetaminophen (TYLENOL) suppository 650 mg (has no administration in time range)  ondansetron (ZOFRAN) tablet 4 mg (has no administration in time range)    Or  ondansetron (ZOFRAN) injection 4 mg (has no administration in time range)  levalbuterol (XOPENEX) nebulizer solution 0.63 mg (0.63 mg Nebulization Given 01/12/18 1433)  guaiFENesin (MUCINEX) 12 hr tablet 600 mg (600 mg Oral Not Given 01/12/18 1112)  furosemide (LASIX) injection 60 mg (has no administration in time range)  cefTRIAXone (ROCEPHIN) 1 g in sodium chloride 0.9 % 100 mL IVPB (0 g Intravenous Stopped 01/12/18 1420)  metroNIDAZOLE (FLAGYL) IVPB 500 mg (0 mg Intravenous Stopped 01/12/18 1254)  furosemide (LASIX) injection 80 mg (80 mg Intravenous Given 01/12/18 0852)     Initial Impression / Assessment and Plan / ED Course  I have reviewed the triage vital signs and the  nursing notes.  Pertinent labs & imaging results that were available during my care of the patient were reviewed by me and  considered in my medical decision making (see chart for details).     84 y.o. M with PMH/o CHF, CKD, CAD, diverticulitis, GERD who presents for evaluation of scrotal swelling and difficulty breathing that has been ongoing for last 3 days.  Progressively worsened.  Family reports that patient cannot even walk to different room in his house without getting severely short of breath.  Reports that he has not been on his Lasix for the last few days. Patient is afebrile, non-toxic appearing.  Signs reviewed.  On my initial evaluation, patient is sleeping.  He is hard to arouse verbally but will respond.  No difficulty waking him with sternal rub.  Consider CHF exacerbation versus ACS etiology versus electrolyte imbalanc.  Also given somnolence, concern for acid-base disturbance.  Initial labs ordered at triage.  Will add additional VBG, CT head.   Troponin negative.  BNP elevated at 1, 360.9.  BMP shows slight bump in creatinine and BUN.  Last creatinine was 3 months ago and was 1.3.  Today is 1.54.  CBC shows hemoglobin of 8.1 and hematocrit of 25.8.  Appears consistent with previous.  VBG shows pH of 7.235.  PO2 is 74.0.  PCO2 is 59.3.  Chest x-ray shows cardiomegaly with increased interstitial opacities suggestive of pulmonary edema.  CT head unremarkable.  Suspect fluid overload is contributing to patient's symptoms particular from noncompliance of Lasix.  Will give IV Lasix here in the department.  Patient was somnolent again on my evaluation with difficulty arousing.  He does wear CPAP while sleeping.  We will plan to start patient on BiPAP.  Given concerns for fluid overload, will need admission.  Discussed with hospitalist.  Agrees with admission.  Final Clinical Impressions(s) / ED Diagnoses   Final diagnoses:  Acute on chronic congestive heart failure, unspecified heart failure type [facility])    ED Discharge Orders    None       Volanda Napoleon, PA-C 01/12/18 1513    Lacretia Leigh,  MD 01/14/18 1158

## 2018-01-12 NOTE — ED Notes (Signed)
Patient presents to ed c/o swelling in his groin onset 2 days ago states this has happened before and was told he had to much salt intake. States [place] and was seen at hospital. Bruising to the right side of his face . Small bruise to left breast. And he is c/o pain left lateral aspect of his neck. States his  Doctor took him off eliquis  2 days ago

## 2018-01-12 NOTE — ED Notes (Signed)
After family arrives at bedside. Patient starts having multiple complaints states he has restless leg syndrome , c/o itching all over , states he takes benadryl and hydrazine at home for same.

## 2018-01-12 NOTE — ED Notes (Signed)
Linens changed patient's urinal accidentally spilled.

## 2018-01-13 ENCOUNTER — Inpatient Hospital Stay (HOSPITAL_COMMUNITY): Payer: Medicare Other

## 2018-01-13 DIAGNOSIS — Z515 Encounter for palliative care: Secondary | ICD-10-CM

## 2018-01-13 DIAGNOSIS — Z7189 Other specified counseling: Secondary | ICD-10-CM

## 2018-01-13 DIAGNOSIS — I340 Nonrheumatic mitral (valve) insufficiency: Secondary | ICD-10-CM

## 2018-01-13 DIAGNOSIS — J9601 Acute respiratory failure with hypoxia: Secondary | ICD-10-CM

## 2018-01-13 DIAGNOSIS — I5022 Chronic systolic (congestive) heart failure: Secondary | ICD-10-CM

## 2018-01-13 LAB — COMPREHENSIVE METABOLIC PANEL
ALBUMIN: 3.1 g/dL — AB (ref 3.5–5.0)
ALK PHOS: 91 U/L (ref 38–126)
ALT: 117 U/L — ABNORMAL HIGH (ref 17–63)
AST: 104 U/L — AB (ref 15–41)
Anion gap: 5 (ref 5–15)
BILIRUBIN TOTAL: 0.8 mg/dL (ref 0.3–1.2)
BUN: 43 mg/dL — ABNORMAL HIGH (ref 6–20)
CALCIUM: 8.6 mg/dL — AB (ref 8.9–10.3)
CO2: 32 mmol/L (ref 22–32)
CREATININE: 1.13 mg/dL (ref 0.61–1.24)
Chloride: 106 mmol/L (ref 101–111)
GFR calc Af Amer: >60 mL/min (ref >60)
GFR calc non Af Amer: 58 mL/min — ABNORMAL LOW (ref >60)
GLUCOSE: 91 mg/dL (ref 65–99)
Potassium: 3.6 mmol/L (ref 3.5–5.1)
SODIUM: 143 mmol/L (ref 135–145)
TOTAL PROTEIN: 6.7 g/dL (ref 6.5–8.1)

## 2018-01-13 LAB — CBC
HCT: 27.2 % — ABNORMAL LOW (ref 39.0–52.0)
Hemoglobin: 8.4 g/dL — ABNORMAL LOW (ref 13.0–17.0)
MCH: 35.4 pg — AB (ref 26.0–34.0)
MCHC: 30.9 g/dL (ref 30.0–36.0)
MCV: 114.8 fL — ABNORMAL HIGH (ref 78.0–100.0)
Platelets: 165 10*3/uL (ref 150–400)
RBC: 2.37 MIL/uL — ABNORMAL LOW (ref 4.22–5.81)
RDW: 17.7 % — ABNORMAL HIGH (ref 11.5–15.5)
WBC: 3.3 10*3/uL — ABNORMAL LOW (ref 4.0–10.5)

## 2018-01-13 LAB — ECHOCARDIOGRAM COMPLETE
HEIGHTINCHES: 67 in
WEIGHTICAEL: 3192.26 [oz_av]

## 2018-01-13 LAB — TROPONIN I: Troponin I: <0.03 ng/mL (ref <0.03)

## 2018-01-13 MED ORDER — ESCITALOPRAM OXALATE 10 MG PO TABS
5.0000 mg | ORAL_TABLET | Freq: Every day | ORAL | Status: DC
  Administered 2018-01-13 – 2018-01-17 (×5): 5 mg via ORAL
  Filled 2018-01-13 (×5): qty 1

## 2018-01-13 MED ORDER — GUAIFENESIN ER 600 MG PO TB12: 600.0000 mg | ORAL_TABLET | Freq: Two times a day (BID) | ORAL | Status: DC | PRN

## 2018-01-13 MED ORDER — PRESERVISION AREDS 2 PO CAPS: 1.0000 | ORAL_CAPSULE | Freq: Every day | ORAL | Status: DC

## 2018-01-13 MED ORDER — PROSIGHT PO TABS
1.0000 | ORAL_TABLET | Freq: Every day | ORAL | Status: DC
  Administered 2018-01-14 – 2018-01-18 (×5): 1 via ORAL
  Filled 2018-01-13 (×5): qty 1

## 2018-01-13 MED ORDER — PERFLUTREN LIPID MICROSPHERE
INTRAVENOUS | Status: AC
  Filled 2018-01-13: qty 10

## 2018-01-13 MED ORDER — MUPIROCIN 2 % EX OINT
1.0000 "application " | TOPICAL_OINTMENT | Freq: Two times a day (BID) | CUTANEOUS | Status: AC
  Administered 2018-01-13 – 2018-01-17 (×10): 1 via NASAL
  Filled 2018-01-13 (×2): qty 22

## 2018-01-13 MED ORDER — CHLORHEXIDINE GLUCONATE CLOTH 2 % EX PADS
6.0000 | MEDICATED_PAD | Freq: Every day | CUTANEOUS | Status: AC
  Administered 2018-01-13 – 2018-01-16 (×3): 6 via TOPICAL

## 2018-01-13 MED ORDER — DUTASTERIDE 0.5 MG PO CAPS
0.5000 mg | ORAL_CAPSULE | Freq: Every day | ORAL | Status: DC
  Administered 2018-01-13 – 2018-01-18 (×6): 0.5 mg via ORAL
  Filled 2018-01-13 (×6): qty 1

## 2018-01-13 NOTE — Progress Notes (Signed)
  Echocardiogram 2D Echocardiogram has been performed.  [name] 01/13/2018, 3:49 PM

## 2018-01-13 NOTE — Progress Notes (Signed)
Progress Note  Patient Name: [name] Date of Encounter: 01/13/2018  Primary Cardiologist: [name], MD   Subjective   Feeling much better. Alert and coherent. Still using pursed lip breathing. Edema markedly improved.  Inpatient Medications    Scheduled Meds: . allopurinol  200 mg Oral Daily  . apixaban  2.5 mg Oral BID  . Chlorhexidine Gluconate Cloth  6 each Topical Q0600  . ferrous sulfate  325 mg Oral BID WC  . furosemide  60 mg Intravenous BID  . guaiFENesin  600 mg Oral BID  . levalbuterol  0.63 mg Nebulization TID  . mirtazapine  15 mg Oral QHS  . mupirocin ointment  1 application Nasal BID  . pantoprazole  40 mg Oral Daily  . rOPINIRole  3 mg Oral QHS  . tamsulosin  0.4 mg Oral QPC breakfast   Continuous Infusions: . cefTRIAXone (ROCEPHIN)  IV 1 g (01/13/18 0918)  . metronidazole 500 mg (01/13/18 0517)   PRN Meds: acetaminophen **OR** acetaminophen, colchicine, hydrOXYzine, naLOXone (NARCAN)  injection, ondansetron **OR** ondansetron (ZOFRAN) IV   Vital Signs    Vitals:   01/12/18 2247 01/13/18 0121 01/13/18 0422 01/13/18 0813  BP:  (!) 126/57 116/63   Pulse:  60 60   Resp: 14  17   Temp:  98.7 F (37.1 C) 98.2 F (36.8 C)   TempSrc:  Oral Oral   SpO2: 100% 100% 100% 100%  Weight:      Height:        Intake/Output Summary (Last 24 hours) at 01/13/2018 1020 Last data filed at 01/13/2018 0517 Gross per 24 hour  Intake 220 ml  Output 4355 ml  Net -4135 ml   Filed Weights   01/12/18 1626  Weight: 199 lb 8.3 oz (90.5 kg)    Telemetry    AFib , V paced - Personally Reviewed  ECG    No new tracing - Personally Reviewed  Physical Exam  Alert, oriented GEN: No acute distress.   Neck: No JVD Cardiac: RRR, split S2, 3/6 holosystolic murmur of TR at LLSB, no diastolic murmurs, rubs, or gallops.  Respiratory: prolonged expiration, but no wheezes GI: Soft, nontender, non-distended  MS: No edema; No deformity. Neuro:  Nonfocal    Psych: Normal affect   Labs    Chemistry Recent Labs  Lab 01/12/18 0306 01/13/18 0523  NA 142 143  K 4.1 3.6  CL 107 106  CO2 26 32  GLUCOSE 103* 91  BUN 55* 43*  CREATININE 1.54* 1.13  CALCIUM 8.7* 8.6*  PROT  --  6.7  ALBUMIN  --  3.1*  AST  --  104*  ALT  --  117*  ALKPHOS  --  91  BILITOT  --  0.8  GFRNONAA 40* 58*  GFRAA 46* >60  ANIONGAP 9 5     Hematology Recent Labs  Lab 01/12/18 0306 01/12/18 1540 01/13/18 0523  WBC 4.3  --  3.3*  RBC 2.26* 3.62* 2.37*  HGB 8.1*  --  8.4*  HCT 25.8*  --  27.2*  MCV 114.2*  --  114.8*  MCH 35.8*  --  35.4*  MCHC 31.4  --  30.9  RDW 17.6*  --  17.7*  PLT 164  --  165    Cardiac Enzymes Recent Labs  Lab 01/12/18 0931 01/12/18 1540 01/12/18 2340  TROPONINI <0.03 0.03* <0.03    Recent Labs  Lab 01/12/18 0323  TROPIPOC 0.01     BNP Recent Labs  Lab 01/12/18 0306  BNP 1,360.9*     DDimer No results for input(s): DDIMER in the last 168 hours.   Radiology    Dg Chest 2 View  Result Date: 01/12/2018 CLINICAL DATA:  Shortness of breath. EXAM: CHEST - 2 VIEW COMPARISON:  11/03/2017 FINDINGS: Marked cardiomegaly, similar to prior exam. Single lead pacemaker remains in place. Fine interstitial opacities suggest pulmonary edema. Small bilateral pleural effusions. No focal airspace disease or pneumothorax. Chronic change about shoulders, the bones are under mineralized. IMPRESSION: Cardiomegaly. Increased interstitial opacities suggest pulmonary edema. Electronically Signed   By: Jeb Levering M.D.   On: 01/12/2018 03:26   Ct Head Wo Contrast  Result Date: 01/12/2018 CLINICAL DATA:  Headache EXAM: CT HEAD WITHOUT CONTRAST TECHNIQUE: Contiguous axial images were obtained from the base of the skull through the vertex without intravenous contrast. COMPARISON:  November 03, 2017 and April 05, 2017 FINDINGS: Brain: Mild diffuse atrophy is stable. There is no intracranial mass, hemorrhage, extra-axial fluid collection,  or midline shift. There is patchy small vessel disease in the centra semiovale bilaterally. There is a small lacunar infarct in the posterior limb of the left internal capsule, stable. There is also a small prior lacunar infarct in the anterior limb of the left internal capsule. There is no appreciable new gray-white compartment lesion. No acute infarct is demonstrable. Vascular: There is no appreciable hyperdense vessel. There is calcification in each carotid siphon region. Skull: The bony calvarium a appears intact. Sinuses/Orbits: There is mucosal thickening in several ethmoid air cells. Other paranasal sinuses are clear. There is rightward deviation of the nasal septum. Orbits appear symmetric bilaterally. Other: Mastoid air cells are clear. IMPRESSION: Stable atrophy with supratentorial small vessel disease. No evident acute infarct. No mass or hemorrhage. There are foci of arterial vascular calcification. There is mucosal thickening in several ethmoid air cells. There is rightward deviation of the nasal septum. Electronically Signed   By: Lowella Grip III M.D.   On: 01/12/2018 08:42    Cardiac Studies   09/21/2017 Echocardiogram:  Study Conclusions - Left ventricle: The cavity size was moderately dilated. There was mild concentric hypertrophy. Systolic function was severely reduced. The estimated ejection fraction was in the range of 25% to 30%. The study is not technically sufficient to allow evaluation of LV diastolic function. - Mitral valve: There was mild regurgitation. - Left atrium: The atrium was moderately dilated. - Right ventricle: The cavity size was moderately dilated. - Right atrium: The atrium was moderately dilated. - Atrial septum: No defect or patent foramen ovale was identified. - Tricuspid valve: There was severe regurgitation. - Pulmonary arteries: PA peak pressure: 111 mm Hg (S).  Notes recorded by [name], MD on 09/22/2017 at 4:42 PM  I disagree  with the report. I do not think the EF is 25-30%, but rather 40-45%, close to his previous baseline. There is however profound dyssynchrony of contraction due to RV pacing (which is occurring with increasing frequency due to slow heart rates (although none of his meds slow the heart rate). The biggest problem remains severe pulmonary HTN and severe tricuspid insufficiency. These are related to obesity, OSA and probably the pacemaker lead across the tricuspid valve.    Patient Profile     84 y.o. male with a hx of nonischemic cardiomyopathy (EF 40-45%), right sided heart failure, PAH, OSA, severe tricuspid regurgitation, permanent atrial fibrillation with slow ventricular response, single chamber pacemaker, and HTN admitted with acute hypercapnic respiratory failure and right heart  failure exacerbation, improved with BiPAP and diuretics.  Assessment & Plan    1. Acute on chronic respiratory failure: improved, still with pursed lip breathing 2. Acute on Chronic Heart Failure: predominantly right heart failure, noncompliance with diuretics (ran out on trip to [facility]), rapidly improving, net diuresis >4L. Renal parameters improved with diuresis. Switch to PO diuretics. Target weight < 190 lb. Prior cath in 2010 without CAD. He has been unable to tolerate a beta blocker due to a-fib with slow ventricular response requiring pacemaker. Unable to tolerate ACEi/ARB due to low blood pressure. He is managed soley with diuretics.  Reevaluate LVEF since I am not in agreement with last report. If EF truly <35%, consider CRT-P upgrade. 3. Persistent Atrial Fibrillation with slow ventricular response: mostly paced, which is disadvantageous due to ventricular dyssynchrony. Avoid rate slowing medications.Not a good candidate for chronic outpatient anticoagulation due to falls, despite increased embolic risk.  4. CKD III: creatinine improved since admission. 5. OSA:  BiPAP at night and prn during the day 6. Chronic  Anemia: Hemoglobin 8.1 from 9.8 one month ago. Baseline 8.6-9.8. Markedly macrocytic parameters, low retic, normal folate level and B12 - more than just Fe deficiency, consider Hematology evaluation. Likely has myelodysplasia. 7. Pacemaker: normal device function, high burden of V pacing due to high grade AV block, permanent AFib.  For questions or updates, please contact [place] Please consult [email or web address] for contact info under Cardiology/STEMI.      Signed, [name], MD  01/13/2018, 10:20 AM

## 2018-01-13 NOTE — Consult Note (Signed)
Consultation Note Date: 01/13/2018   Patient Name: [name]  DOB: [birth year]  MRN: [id number]  Age / Sex: 84 y.o., male  PCP: [name], [name] Him, MD Referring Physician: Cherene Altes, MD  Reason for Consultation: Establishing goals of care  HPI/Patient Profile: 84 year old male with a history of chronic systolic heart failure EF 25-30% on most recent echo, atrial fibrillation on  Eliquis, chronic kidney disease stage III, coronary artery disease,obstructive sleep apnea on C Pap at home, pacemaker,who presents to the ED today because of worsening swelling scrotal edema, altered mental status.    Clinical Assessment and Goals of Care: Patient is resting in bed. Wife at bedside. He states he is feeling better. His current wife is is second wife, he has children from both marriages. He has a on who is an EMT, and a daughter who is a Marine scientist.  He is a retired Dance movement psychotherapist.   Functionally, he does not like to sit, and always is working on something. For short distances, he uses a cane at times, most mostly is independent of DME. He does not use O2 at home. He uses a wheelchair for long distances. He has used CPAP for 6 years. No issues with oral intake. Here, he is on a dysphagia diet because he does not have his dentures.    Attempted to discuss his diagnosis, prognosis, GOC, EOL wishes disposition and options.  He states he wants everything done for him that can be. When we discussed suffering and a point he may want to stop, he stated he would need to think about his options. He states pointing to his wife "we're both hard headed." His wife states the daughter who is a Marine scientist thinks they are in denial. She states she understands people die, but until he is ready to stop fighting, they will continue on. She states she had a love one recently who died at [facility]. She states they are not ready to have  those discussions. Encouraged them to speak to each other, that it is important for each to know the other's wishes.     SUMMARY OF RECOMMENDATIONS    Continue current care and full code status.   Code Status/Advance Care Planning:  Full code    Symptom Management:   Per priamry team, no complaints.  Palliative Prophylaxis:   Eye Care and Oral Care  Prognosis:   Poor long term.   Discharge Planning: To Be Determined      Primary Diagnoses: Present on Admission: . Dyspnea on exertion . Atrial fibrillation, persistent, slow VR . Chronic systolic heart failure ([place]) . Chronic atrial fibrillation ([place]) . CKD (chronic kidney disease), stage III ([place]) . Acute renal failure superimposed on stage 3 chronic kidney disease ([place]) . Acute respiratory failure with hypoxia ([place])   I have reviewed the medical record, interviewed the patient and family, and examined the patient. The following aspects are pertinent.  Past Medical History:  Diagnosis Date  . Arthritis    "feet" (09/30/2017)  . CHF (  congestive heart failure) ([place])   . Chronic bronchitis ([place])   . CKD (chronic kidney disease), stage III ([place])    [name] 09/30/2017  . Coronary artery disease   . Diverticulitis   . GERD (gastroesophageal reflux disease)   . Gout   . History of blood transfusion    "blood loss" (09/30/2017)  . Hyperlipidemia   . Hypertension   . MI (myocardial infarction) ([place]) ~ 2000   "light one"  . Nonischemic cardiomyopathy (H[facility])    mild  . OSA on CPAP   . Permanent atrial fibrillation (H[facility])    on Eliquis  . Presence of permanent cardiac pacemaker   . Pulmonary hypertension ([place])   . Restless legs    Social History   Socioeconomic History  . Marital status: Married    Spouse name: Not on file  . Number of children: Not on file  . Years of education: Not on file  . Highest education level: Not on file  Occupational History  . Occupation: retired  Scientific laboratory technician  . Financial  resource strain: Not on file  . Food insecurity:    Worry: Not on file    Inability: Not on file  . Transportation needs:    Medical: Not on file    Non-medical: Not on file  Tobacco Use  . Smoking status: Former Research scientist (life sciences)  . Smokeless tobacco: Never Used  . Tobacco comment: 09/30/2017 "it's been over 18yr since I smoked anything"  Substance and Sexual Activity  . Alcohol use: No  . Drug use: No  . Sexual activity: Never  Lifestyle  . Physical activity:    Days per week: Not on file    Minutes per session: Not on file  . Stress: Not on file  Relationships  . Social connections:    Talks on phone: Not on file    Gets together: Not on file    Attends religious service: Not on file    Active member of club or organization: Not on file    Attends meetings of clubs or organizations: Not on file    Relationship status: Not on file  Other Topics Concern  . Not on file  Social History Narrative  . Not on file   Family History  Problem Relation Age of Onset  . Cancer Mother   . Heart attack Father   . Diabetes Brother    Scheduled Meds: . allopurinol  200 mg Oral Daily  . Chlorhexidine Gluconate Cloth  6 each Topical Q0600  . ferrous sulfate  325 mg Oral BID WC  . furosemide  60 mg Intravenous BID  . guaiFENesin  600 mg Oral BID  . levalbuterol  0.63 mg Nebulization TID  . mirtazapine  15 mg Oral QHS  . mupirocin ointment  1 application Nasal BID  . pantoprazole  40 mg Oral Daily  . rOPINIRole  3 mg Oral QHS  . tamsulosin  0.4 mg Oral QPC breakfast   Continuous Infusions: . cefTRIAXone (ROCEPHIN)  IV Stopped (01/13/18 0948)  . metronidazole 500 mg (01/13/18 1346)   PRN Meds:.acetaminophen **OR** acetaminophen, colchicine, hydrOXYzine, naLOXone (NARCAN)  injection, ondansetron **OR** ondansetron (ZOFRAN) IV Medications Prior to Admission:  Prior to Admission medications   Medication Sig Start Date End Date Taking? Authorizing Provider  acetaminophen (TYLENOL) 650 MG CR  tablet Take 650 mg by mouth every 8 (eight) hours as needed for pain.   Yes [provider]  allopurinol (ZYLOPRIM) 100 MG tablet Take 2 tablets (200 mg  total) by mouth daily. 11/09/16  Yes Theodis Blaze, MD  colchicine 0.6 MG tablet Take 0.6 mg by mouth daily as needed (gout flare). Colcrys   Yes [provider]  Docusate Calcium (STOOL SOFTENER PO) Take 2 tablets by mouth at bedtime.    Yes [provider]  dutasteride (AVODART) 0.5 MG capsule Take 0.5 mg by mouth daily.   Yes [provider]  escitalopram (LEXAPRO) 5 MG tablet Take 1 tablet (5 mg total) by mouth at bedtime. 11/09/16  Yes Theodis Blaze, MD  ferrous sulfate 325 (65 FE) MG tablet Take 1 tablet (325 mg total) by mouth 2 (two) times daily with a meal. 11/14/17  Yes Croitoru, Mihai, MD  furosemide (LASIX) 40 MG tablet Take 40-80 mg by mouth 2 (two) times daily. Take 80mg  by mouth in the morning and 40mg  in the afternoon    Yes [provider]  hydrOXYzine (ATARAX/VISTARIL) 25 MG tablet TAKE 1 TABLET BY MOUTH EVERY 6 HOURS AS NEEDED FOR  ITCHING 01/11/18  Yes Croitoru, Mihai, MD  mirtazapine (REMERON) 15 MG tablet Take 15 mg by mouth at bedtime.   Yes [provider]  Multiple Vitamin (MULTIVITAMIN WITH MINERALS) TABS tablet Take 1 tablet by mouth daily.   Yes [provider]  Multiple Vitamins-Minerals (PRESERVISION AREDS 2) CAPS Take 1 capsule by mouth daily.   Yes [provider]  pantoprazole (PROTONIX) 40 MG tablet Take 1 tablet (40 mg total) by mouth daily. Patient taking differently: Take 40 mg by mouth 2 (two) times daily.  11/08/17  Yes Nita Sells, MD  potassium chloride (K-DUR) 10 MEQ tablet Take 1 tablet (10 mEq total) by mouth daily. 03/17/17  Yes Croitoru, Mihai, MD  rOPINIRole (REQUIP) 3 MG tablet Take 3 mg by mouth at bedtime.  08/02/15  Yes [provider]  Tamsulosin HCl (FLOMAX) 0.4 MG CAPS Take 0.4 mg by mouth daily after breakfast.   Yes  [provider]  traMADol (ULTRAM) 50 MG tablet Take 100 mg by mouth 2 (two) times daily. For pain    Yes [provider]  Amino Acids-Protein Hydrolys (FEEDING SUPPLEMENT, PRO-STAT SUGAR FREE 64,) LIQD Take 30 mLs by mouth 2 (two) times daily. Patient not taking: Reported on 01/12/2018 10/02/17   Jani Gravel, MD  apixaban (ELIQUIS) 5 MG TABS tablet Take 1 tablet (5 mg total) by mouth 2 (two) times daily. Patient taking differently: Take 2.5 mg by mouth 2 (two) times daily.  11/10/17   Nita Sells, MD   Allergies  Allergen Reactions  . Vicodin [Hydrocodone-Acetaminophen] Itching and Nausea Only   Review of Systems  Musculoskeletal:       Swelling    Physical Exam  Constitutional: No distress.  Pulmonary/Chest: Effort normal.  Neurological: He is alert.    Vital Signs: BP 116/63 (BP Location: Right Arm)   Pulse 60   Temp 98.2 F (36.8 C) (Oral)   Resp 17   Ht 5\' 7"  (1.702 m)   Wt 90.5 kg (199 lb 8.3 oz)   SpO2 100%   BMI 31.25 kg/m  Pain Scale: 0-10   Pain Score: 0-No pain   SpO2: SpO2: 100 % O2 Device:SpO2: 100 % O2 Flow Rate: .O2 Flow Rate (L/min): 3 L/min  IO: Intake/output summary:   Intake/Output Summary (Last 24 hours) at 01/13/2018 1629 Last data filed at 01/13/2018 1300 Gross per 24 hour  Intake 320 ml  Output 3050 ml  Net -2730 ml    LBM: Last  BM Date: 01/12/18 Baseline Weight: Weight: 90.5 kg (199 lb 8.3 oz) Most recent weight: Weight: 90.5 kg (199 lb 8.3 oz)     Palliative Assessment/Data: 60%     Time In: 3:15 Time Out: 4:25 Time Total: 70 min Greater than 50%  of this time was spent counseling and coordinating care related to the above assessment and plan.  Signed by: Asencion Gowda, NP   Please contact Palliative Medicine Team phone at [phone] for questions and concerns.  For individual provider: See Shea Evans

## 2018-01-13 NOTE — Progress Notes (Signed)
[facility] TEAM 1 - Stepdown/ICU TEAM  [name]  NWG:[id number] DOB: Jan 03, 1932 DOA: 01/12/2018 PCP: [name], MD    Brief Narrative:  84yo M w/ a hx of chronic systolic CHF (EF 57-84%), atrial fibrillation on  Eliquis, CKD stage III, CAD, obstructive sleep apnea on C Pap at home, and s/p pacemaker who presented to the ED w/ worsening edema.  Patient had been taking half the dose of his prescribed Lasix to prevent from running out while out of town on vacation. Patient also reported intermittent black tarry stools w/ abdom pain.  Significant Events: 6/6 admit  Subjective: Pt states he is feeling much better in general.  His abdom pain has resolved.  His sob has improved significantly.  He denies cp, n/v, or HA.    Assessment & Plan:  Acute encephalopathy secondary to hypercapnic respiratory failure Due to congestive heart failure compounded by a obstructive sleep apnea - appears to have resolved    Acute exacerbation of chronic systolic CHF - R sided heart failure - PAH - severe Tricuspid regurg TTE 09/21/17 noted EF 25-30% - Cardiology is following - due to noncompliance w/ full lasix dose - tx as per Cards    Acute duodenitis No evidence of ongoing GI bleeding - on empiric rocephin and flagyl - clinically much improved   Macrocytic anemia  baseline hemoglobin reportedly 8.6-9.8 - EGD 3/30 noted angiodysplasia status post fulguration and a hiatal hernia - B12 normal - folate normal - is not Fe deficient, though Fe indices are borderline c/w anemia of chronic disease   Chronic kidney disease stage III baseline creatinine around 1.4 - crt presently better than his baseline  Recent Labs  Lab 01/12/18 0306 01/13/18 0523  CREATININE 1.54* 1.13    BPH continue Flomax  Dementia Holding Lexapro and Remeron for now - follow   Chronic Atrial fibrillation w/ SVR Off anticoag due to multiple frequent falls  OSA on CPAP Cont CPAP per home regimen   MRSA screen  +   DVT prophylaxis: SCDs Code Status: FULL CODE Family Communication: spoke w/ wife at bedside  Disposition Plan: SDU   Consultants:  Cardiology   Antimicrobials:  Rocephin 6/6> Flagyl 6/6>  Objective: Blood pressure 116/63, pulse 60, temperature 98.2 F (36.8 C), temperature source Oral, resp. rate 17, height 5\' 7"  (1.702 m), weight 90.5 kg (199 lb 8.3 oz), SpO2 100 %.  Intake/Output Summary (Last 24 hours) at 01/13/2018 1612 Last data filed at 01/13/2018 1300 Gross per 24 hour  Intake 320 ml  Output 3050 ml  Net -2730 ml   Filed Weights   01/12/18 1626  Weight: 90.5 kg (199 lb 8.3 oz)    Examination: General: No acute respiratory distress Lungs: Clear to auscultation bilaterally without wheezes or crackles Cardiovascular: Regular rate and rhythm without murmur  Abdomen: Nontender, nondistended, soft, bowel sounds positive, no rebound, no ascites, no appreciable mass Extremities: 1+ edema B LE   CBC: Recent Labs  Lab 01/12/18 0306 01/13/18 0523  WBC 4.3 3.3*  HGB 8.1* 8.4*  HCT 25.8* 27.2*  MCV 114.2* 114.8*  PLT 164 696   Basic Metabolic Panel: Recent Labs  Lab 01/12/18 0306 01/12/18 0931 01/13/18 0523  NA 142  --  143  K 4.1  --  3.6  CL 107  --  106  CO2 26  --  32  GLUCOSE 103*  --  91  BUN 55*  --  43*  CREATININE 1.54*  --  1.13  CALCIUM 8.7*  --  8.6*  MG  --  2.2  --    GFR: Estimated Creatinine Clearance: 52.2 mL/min (by C-G formula based on SCr of 1.13 mg/dL).  Liver Function Tests: Recent Labs  Lab 01/13/18 0523  AST 104*  ALT 117*  ALKPHOS 91  BILITOT 0.8  PROT 6.7  ALBUMIN 3.1*    Recent Labs  Lab 01/12/18 0931  AMMONIA 41*    Cardiac Enzymes: Recent Labs  Lab 01/12/18 0931 01/12/18 1540 01/12/18 2340  TROPONINI <0.03 0.03* <0.03    HbA1C: Hgb A1c MFr Bld  Date/Time Value Ref Range Status  01/12/2018 09:31 AM 5.2 4.8 - 5.6 % Final    Comment:    (NOTE) Pre diabetes:          5.7%-6.4% Diabetes:               >6.4% Glycemic control for   <7.0% adults with diabetes   11/03/2017 04:07 PM 4.5 (L) 4.8 - 5.6 % Final    Comment:    (NOTE) Pre diabetes:          5.7%-6.4% Diabetes:              >6.4% Glycemic control for   <7.0% adults with diabetes      Recent Results (from the past 240 hour(s))  MRSA PCR Screening     Status: Abnormal   Collection Time: 01/12/18  4:56 PM  Result Value Ref Range Status   MRSA by PCR POSITIVE (A) NEGATIVE Final    Comment:        The GeneXpert MRSA Assay (FDA approved for NASAL specimens only), is one component of a comprehensive MRSA colonization surveillance program. It is not intended to diagnose MRSA infection nor to guide or monitor treatment for MRSA infections. RESULT CALLED TO, READ BACK BY AND VERIFIED WITHMurvin Natal RN [id number] 01/12/18 A BROWNING Performed at [facility], [place] [address], [place], [place] [address]      Scheduled Meds: . allopurinol  200 mg Oral Daily  . Chlorhexidine Gluconate Cloth  6 each Topical Q0600  . ferrous sulfate  325 mg Oral BID WC  . furosemide  60 mg Intravenous BID  . guaiFENesin  600 mg Oral BID  . levalbuterol  0.63 mg Nebulization TID  . mirtazapine  15 mg Oral QHS  . mupirocin ointment  1 application Nasal BID  . pantoprazole  40 mg Oral Daily  . rOPINIRole  3 mg Oral QHS  . tamsulosin  0.4 mg Oral QPC breakfast     LOS: 1 day   Cherene Altes, MD [facility] Office  [phone] Pager - Text Page per Amion as per below:  On-Call/Text Page:      [email or web address]      password [email or web address]  If 7PM-7AM, please contact night-coverage [email or web address] Password [email or web address] 01/13/2018, 4:12 PM

## 2018-01-13 NOTE — Evaluation (Addendum)
Clinical/Bedside Swallow Evaluation Patient Details  Name: [name] MRN: [id number] Date of Birth: [birth year]  Today's Date: 01/13/2018 Time: SLP Start Time (ACUTE ONLY): 0858 SLP Stop Time (ACUTE ONLY): 0916 SLP Time Calculation (min) (ACUTE ONLY): 18 min  Past Medical History:  Past Medical History:  Diagnosis Date  . Arthritis    "feet" (09/30/2017)  . CHF (congestive heart failure) ([facility])   . Chronic bronchitis ([place])   . CKD (chronic kidney disease), stage III ([place])    [name] 09/30/2017  . Coronary artery disease   . Diverticulitis   . GERD (gastroesophageal reflux disease)   . Gout   . History of blood transfusion    "blood loss" (09/30/2017)  . Hyperlipidemia   . Hypertension   . MI (myocardial infarction) ([place]) ~ 2000   "light one"  . Nonischemic cardiomyopathy (H[facility])    mild  . OSA on CPAP   . Permanent atrial fibrillation (HCC)    on Eliquis  . Presence of permanent cardiac pacemaker   . Pulmonary hypertension ([place])   . Restless legs    Past Surgical History:  Past Surgical History:  Procedure Laterality Date  . APPENDECTOMY  12/2000   [name] 12/22/2010  . CARDIAC CATHETERIZATION  11/07/2008   nonischemic cardiomyopathy,pulmonary hypertension  . CATARACT EXTRACTION W/ INTRAOCULAR LENS  IMPLANT, BILATERAL Bilateral   . CIRCUMCISION  12/2005   [name] 12/22/2010  . COLOSTOMY  12/2000   [name] 12/22/2010  . COLOSTOMY REVERSAL  07/2001   [name] 12/22/2010  . CORONARY ANGIOPLASTY  06/01/1999   successful to ostium of the first diagonal  . ESOPHAGOGASTRODUODENOSCOPY N/A 08/22/2013   Procedure: ESOPHAGOGASTRODUODENOSCOPY (EGD);  Surgeon: [name], MD;  Location: [facility] ENDOSCOPY;  Service: Endoscopy;  Laterality: N/A;  h/p in file cabinet, jackie  . ESOPHAGOGASTRODUODENOSCOPY N/A 11/05/2014   Procedure: ESOPHAGOGASTRODUODENOSCOPY (EGD);  Surgeon: Clarene Essex, MD;  Location: [facility] ENDOSCOPY;  Service: Endoscopy;  Laterality: N/A;  . ESOPHAGOGASTRODUODENOSCOPY N/A 11/08/2016    Procedure: ESOPHAGOGASTRODUODENOSCOPY (EGD);  Surgeon: [name], MD;  Location: [facility] ENDOSCOPY;  Service: Endoscopy;  Laterality: N/A;  . ESOPHAGOGASTRODUODENOSCOPY (EGD) WITH PROPOFOL Left 11/05/2017   Procedure: ESOPHAGOGASTRODUODENOSCOPY (EGD) WITH PROPOFOL;  Surgeon: [name], MD;  Location: [place];  Service: Endoscopy;  Laterality: Left;  . HOT HEMOSTASIS N/A 11/05/2014   Procedure: HOT HEMOSTASIS (ARGON PLASMA COAGULATION/BICAP);  Surgeon: Clarene Essex, MD;  Location[facility] ENDOSCOPY;  Service: Endoscopy;  Laterality: N/A;  . INSERT / REPLACE / REMOVE PACEMAKER    . JOINT REPLACEMENT    . MASS EXCISION Left    hand w/ulnar artery reconstruction/notes 12/22/2010  . NM MYOCAR PERF WALL MOTION  11/24/2007   normal  . PERMANENT PACEMAKER INSERTION  10/04/2012   [employer]  . PERMANENT PACEMAKER INSERTION N/A 10/12/2011   Procedure: PERMANENT PACEMAKER INSERTION;  Surgeon: [name], MD;  Location: [place] CATH LAB;  Service: Cardiovascular;  Laterality: N/A;  . REPLACEMENT TOTAL KNEE Bilateral 11/2006   right-left/notes 12/22/2010  . ROTATOR CUFF REPAIR Right 09/2004   [name] 12/22/2010  . SAVORY DILATION N/A 08/22/2013   Procedure: SAVORY DILATION;  Surgeon: [name], MD;  Location: [facility] ENDOSCOPY;  Service: Endoscopy;  Laterality: N/A;  . SHOULDER SURGERY Right    "fell off house; messed up 3 things in my arm"  . TONSILLECTOMY    . US ECHOCARDIOGRAPHY  02/01/2011   LA is mod-severely dilated,AOV & root sclerotic,ca+ AOV leaflets   HPI:  84 year old male with a history of GERD, esophageal sphincter  ring dilated 7893, chronic systolic heart failure, EF 25-30%, atrial fibrillation, chronic kidney disease stage III, coronary artery disease,obstructive sleep apnea on C Pap at home, pacemaker,who presents to the ED because of worsening swelling scrotal edema, altered mental status, abdominal pain and black tarry stools. Per MD note pt had been taking half of prescribed Lasix,  to prevent from running out. y stools. EGD in March 2016 showed erosive esophagitis. CT head negative for acute abnormalities. CXR cardiomegaly. Increased interstitial opacities suggest pulmonary edema. Pt was admitted for hypercapnic hypoxic respiratory failure, acute duodenitis,  acute on chronic heart failure.   Assessment / Plan / Recommendation Clinical Impression  Pt has upper denture plate that is not currently in hospital room (lower plate ill fitting) and reports difficulty masticating and coughs sometimes with food (admits to eating fast). He denies significant pharyngeal or globus sensation and doesn't "think it's time to have his esophagus stretched again. No s/s aspiration during observation and suspect symptoms may be from esophageal source. Pt slightly dyspneic during eval which can disrupt swallow integrity and safety. Educated pt to take rest breaks if needed, no talking immediately after swallow. Reviewed precautions to decrease symptoms verbally and written information. Encouraged him to see GI if symptoms increase. Pt in agreement with downgrading to chopped meats (D3) and continue thin liquids while in hospital. No further ST needed at this time.   SLP Visit Diagnosis: Dysphagia, unspecified (R13.10)    Aspiration Risk  Mild aspiration risk;Moderate aspiration risk    Diet Recommendation Dysphagia 3 (Mech soft);Thin liquid   Liquid Administration via: Cup;Straw Medication Administration: Whole meds with liquid Supervision: Patient able to self feed Compensations: Slow rate;Small sips/bites Postural Changes: Remain upright for at least 30 minutes after po intake;Seated upright at 90 degrees    Other  Recommendations Oral Care Recommendations: Oral care BID   Follow up Recommendations None      Frequency and Duration            Prognosis        Swallow Study   General HPI: 84 year old male with a history of GERD, esophageal sphincter ring dilated 8101, chronic  systolic heart failure, EF 25-30%, atrial fibrillation, chronic kidney disease stage III, coronary artery disease,obstructive sleep apnea on C Pap at home, pacemaker,who presents to the ED because of worsening swelling scrotal edema, altered mental status, abdominal pain and black tarry stools. Per MD note pt had been taking half of prescribed Lasix, to prevent from running out. y stools. EGD in March 2016 showed erosive esophagitis. CT head negative for acute abnormalities. CXR cardiomegaly. Increased interstitial opacities suggest pulmonary edema. Pt was admitted for hypercapnic hypoxic respiratory failure, acute duodenitis,  acute on chronic heart failure. Type of Study: Bedside Swallow Evaluation Previous Swallow Assessment: (none) Diet Prior to this Study: Regular;Thin liquids Temperature Spikes Noted: No Respiratory Status: Nasal cannula History of Recent Intubation: No Behavior/Cognition: Alert;Cooperative;Pleasant mood Oral Cavity Assessment: Within Functional Limits Oral Care Completed by SLP: No Oral Cavity - Dentition: Dentures, not available(lower plate not fitting) Vision: Functional for self-feeding Self-Feeding Abilities: Able to feed self Patient Positioning: Upright in bed Baseline Vocal Quality: Normal Volitional Cough: Strong Volitional Swallow: Able to elicit    Oral/Motor/Sensory Function Overall Oral Motor/Sensory Function: Within functional limits   Ice Chips Ice chips: Not tested   Thin Liquid Thin Liquid: Within functional limits Presentation: Cup;Straw    Nectar Thick Nectar Thick Liquid: Not tested   Honey Thick Honey Thick Liquid: Not tested  Puree Puree: Not tested   Solid   GO   Solid: Not tested(see impression)        Houston Siren 01/13/2018,9:25 AM   Orbie Pyo Colvin Caroli.Ed [employer] [phone]

## 2018-01-14 LAB — COMPREHENSIVE METABOLIC PANEL
ALBUMIN: 2.9 g/dL — AB (ref 3.5–5.0)
ALK PHOS: 90 U/L (ref 38–126)
ALT: 104 U/L — ABNORMAL HIGH (ref 17–63)
ANION GAP: 8 (ref 5–15)
AST: 75 U/L — ABNORMAL HIGH (ref 15–41)
BUN: 35 mg/dL — ABNORMAL HIGH (ref 6–20)
CO2: 32 mmol/L (ref 22–32)
Calcium: 8.6 mg/dL — ABNORMAL LOW (ref 8.9–10.3)
Chloride: 105 mmol/L (ref 101–111)
Creatinine, Ser: 1.1 mg/dL (ref 0.61–1.24)
GFR calc non Af Amer: 60 mL/min — ABNORMAL LOW (ref >60)
GLUCOSE: 98 mg/dL (ref 65–99)
POTASSIUM: 3.7 mmol/L (ref 3.5–5.1)
SODIUM: 145 mmol/L (ref 135–145)
Total Bilirubin: 0.8 mg/dL (ref 0.3–1.2)
Total Protein: 6.4 g/dL — ABNORMAL LOW (ref 6.5–8.1)

## 2018-01-14 LAB — CBC
HCT: 27.8 % — ABNORMAL LOW (ref 39.0–52.0)
HEMOGLOBIN: 8.7 g/dL — AB (ref 13.0–17.0)
MCH: 36 pg — AB (ref 26.0–34.0)
MCHC: 31.3 g/dL (ref 30.0–36.0)
MCV: 114.9 fL — AB (ref 78.0–100.0)
Platelets: 172 10*3/uL (ref 150–400)
RBC: 2.42 MIL/uL — AB (ref 4.22–5.81)
RDW: 17.5 % — ABNORMAL HIGH (ref 11.5–15.5)
WBC: 3.6 10*3/uL — ABNORMAL LOW (ref 4.0–10.5)

## 2018-01-14 MED ORDER — CHLORHEXIDINE GLUCONATE 0.12 % MT SOLN
15.0000 mL | Freq: Two times a day (BID) | OROMUCOSAL | Status: DC
  Administered 2018-01-14 – 2018-01-18 (×9): 15 mL via OROMUCOSAL
  Filled 2018-01-14 (×9): qty 15

## 2018-01-14 MED ORDER — TRAMADOL HCL 50 MG PO TABS
100.0000 mg | ORAL_TABLET | Freq: Two times a day (BID) | ORAL | Status: DC | PRN
  Administered 2018-01-14 – 2018-01-16 (×3): 100 mg via ORAL
  Filled 2018-01-14 (×3): qty 2

## 2018-01-14 MED ORDER — ORAL CARE MOUTH RINSE
15.0000 mL | Freq: Two times a day (BID) | OROMUCOSAL | Status: DC
  Administered 2018-01-14 – 2018-01-17 (×7): 15 mL via OROMUCOSAL

## 2018-01-14 NOTE — Progress Notes (Signed)
Placed pt on BiPAP and adjusted setting for pt's comfort.  Pt did not tolerate and asked for the mask to be removed.  Pt placed on 2L [place].  RT will continue to monitor pt and will place on pt if needed or requested.

## 2018-01-14 NOTE — Progress Notes (Signed)
[place] TEAM 1 - Stepdown/ICU TEAM  [name]  NLZ:[id number] DOB: 06/21/1932 DOA: 01/12/2018 PCP: [name], MD    Brief Narrative:  84yo M w/ a hx of chronic systolic CHF (EF 90-24%), atrial fibrillation on Eliquis, CKD stage III, CAD, obstructive sleep apnea on CPAP, and s/p pacemaker who presented to the ED w/ worsening edema.  Patient had been taking half the dose of his prescribed Lasix to prevent from running out while out of town on vacation. Patient also reported intermittent black tarry stools w/ abdom pain.  Significant Events: 6/6 admit  Subjective: The patient is resting comfortably in bed.  He reports that he is much better today.  He denies current shortness of breath nausea vomiting or abdominal pain.  Assessment & Plan:  Acute encephalopathy secondary to hypercapnic respiratory failure Due to congestive heart failure compounded by obstructive sleep apnea - resolved    Acute exacerbation of chronic systolic CHF - R sided heart failure - PAH - severe Tricuspid regurg TTE 09/21/17 noted EF 25-30% - Cardiology is following - exac due to noncompliance w/ full lasix dose - tx as per Cards who is awaiting opinion from EP   Acute duodenitis No evidence of ongoing GI bleeding - on empiric rocephin and flagyl - clinically much improved - plan to d/c abx after 5 days of tx   Macrocytic anemia  baseline hemoglobin reportedly 8.6 > 9.8 - EGD 3/30 noted angiodysplasia status post fulguration and a hiatal hernia - B12 normal - folate normal - is not Fe deficient, though Fe indices are borderline c/w anemia of chronic disease - macrocytosis may simply reflect recovery from recent bleeding episode, but could also be indicative of a marrow issues (MDS, MM, etc) - will need f/u as outpt   Recent Labs  Lab 01/12/18 0306 01/13/18 0523 01/14/18 0756  HGB 8.1* 8.4* 8.7*    Chronic kidney disease stage III baseline creatinine around 1.4 - crt presently better than his  baseline  Recent Labs  Lab 01/12/18 0306 01/13/18 0523 01/14/18 0756  CREATININE 1.54* 1.13 1.10    BPH continue Flomax  Dementia Stable   Chronic Atrial fibrillation w/ SVR Off anticoag due to multiple frequent falls - HR stable   OSA on CPAP Cont CPAP per home regimen   MRSA screen +   DVT prophylaxis: SCDs Code Status: FULL CODE Family Communication: spoke w/ wife at bedside  Disposition Plan: tele bed    Consultants:  Cardiology   Antimicrobials:  Rocephin 6/6> Flagyl 6/6>  Objective: Blood pressure (!) 114/59, pulse 62, temperature 98.2 F (36.8 C), temperature source Oral, resp. rate 16, height 5\' 7"  (1.702 m), weight 88.1 kg (194 lb 3.6 oz), SpO2 94 %.  Intake/Output Summary (Last 24 hours) at 01/14/2018 1513 Last data filed at 01/14/2018 1100 Gross per 24 hour  Intake 1000 ml  Output 2700 ml  Net -1700 ml   Filed Weights   01/12/18 1626 01/14/18 0436  Weight: 90.5 kg (199 lb 8.3 oz) 88.1 kg (194 lb 3.6 oz)    Examination: General: No acute respiratory distress - alert and pleasant  Lungs: Clear to auscultation B - no wheezing  Cardiovascular: Regular rate without murmur  Abdomen: NT/ND, soft, bs+, no mass  Extremities: trace edema B LE   CBC: Recent Labs  Lab 01/12/18 0306 01/13/18 0523 01/14/18 0756  WBC 4.3 3.3* 3.6*  HGB 8.1* 8.4* 8.7*  HCT 25.8* 27.2* 27.8*  MCV 114.2* 114.8* 114.9*  PLT  164 165 431   Basic Metabolic Panel: Recent Labs  Lab 01/12/18 0306 01/12/18 0931 01/13/18 0523 01/14/18 0756  NA 142  --  143 145  K 4.1  --  3.6 3.7  CL 107  --  106 105  CO2 26  --  32 32  GLUCOSE 103*  --  91 98  BUN 55*  --  43* 35*  CREATININE 1.54*  --  1.13 1.10  CALCIUM 8.7*  --  8.6* 8.6*  MG  --  2.2  --   --    GFR: Estimated Creatinine Clearance: 53 mL/min (by C-G formula based on SCr of 1.1 mg/dL).  Liver Function Tests: Recent Labs  Lab 01/13/18 0523 01/14/18 0756  AST 104* 75*  ALT 117* 104*  ALKPHOS 91 90    BILITOT 0.8 0.8  PROT 6.7 6.4*  ALBUMIN 3.1* 2.9*    Recent Labs  Lab 01/12/18 0931  AMMONIA 41*    Cardiac Enzymes: Recent Labs  Lab 01/12/18 0931 01/12/18 1540 01/12/18 2340  TROPONINI <0.03 0.03* <0.03    HbA1C: Hgb A1c MFr Bld  Date/Time Value Ref Range Status  01/12/2018 09:31 AM 5.2 4.8 - 5.6 % Final    Comment:    (NOTE) Pre diabetes:          5.7%-6.4% Diabetes:              >6.4% Glycemic control for   <7.0% adults with diabetes   11/03/2017 04:07 PM 4.5 (L) 4.8 - 5.6 % Final    Comment:    (NOTE) Pre diabetes:          5.7%-6.4% Diabetes:              >6.4% Glycemic control for   <7.0% adults with diabetes      Recent Results (from the past 240 hour(s))  MRSA PCR Screening     Status: Abnormal   Collection Time: 01/12/18  4:56 PM  Result Value Ref Range Status   MRSA by PCR POSITIVE (A) NEGATIVE Final    Comment:        The GeneXpert MRSA Assay (FDA approved for NASAL specimens only), is one component of a comprehensive MRSA colonization surveillance program. It is not intended to diagnose MRSA infection nor to guide or monitor treatment for MRSA infections. RESULT CALLED TO, READ BACK BY AND VERIFIED WITHMurvin Natal RN [id number] 01/12/18 A BROWNING Performed at [facility], [place] [address], [place], [place] [address]      Scheduled Meds: . allopurinol  200 mg Oral Daily  . chlorhexidine  15 mL Mouth Rinse BID  . Chlorhexidine Gluconate Cloth  6 each Topical Q0600  . dutasteride  0.5 mg Oral Daily  . escitalopram  5 mg Oral QHS  . ferrous sulfate  325 mg Oral BID WC  . furosemide  60 mg Intravenous BID  . levalbuterol  0.63 mg Nebulization TID  . mouth rinse  15 mL Mouth Rinse q12n4p  . mirtazapine  15 mg Oral QHS  . multivitamin  1 tablet Oral Daily  . mupirocin ointment  1 application Nasal BID  . pantoprazole  40 mg Oral Daily  . rOPINIRole  3 mg Oral QHS  . tamsulosin  0.4 mg Oral QPC breakfast     LOS: 2 days   Cherene Altes, MD [facility] Office  [phone] Pager - Text Page per Amion as per below:  On-Call/Text Page:  [email or web address]      password [email or web address]  If 7PM-7AM, please contact night-coverage [email or web address] Password [email or web address] 01/14/2018, 3:13 PM

## 2018-01-14 NOTE — Progress Notes (Addendum)
Progress Note  Patient Name: [name] Date of Encounter: 01/14/2018  Primary Cardiologist: [name], MD   Subjective   Feels better. He had his supplemental O2 off when I entered room. No labored breathing. Denies CP.   Inpatient Medications    Scheduled Meds: . allopurinol  200 mg Oral Daily  . chlorhexidine  15 mL Mouth Rinse BID  . Chlorhexidine Gluconate Cloth  6 each Topical Q0600  . dutasteride  0.5 mg Oral Daily  . escitalopram  5 mg Oral QHS  . ferrous sulfate  325 mg Oral BID WC  . furosemide  60 mg Intravenous BID  . levalbuterol  0.63 mg Nebulization TID  . mouth rinse  15 mL Mouth Rinse q12n4p  . mirtazapine  15 mg Oral QHS  . multivitamin  1 tablet Oral Daily  . mupirocin ointment  1 application Nasal BID  . pantoprazole  40 mg Oral Daily  . rOPINIRole  3 mg Oral QHS  . tamsulosin  0.4 mg Oral QPC breakfast   Continuous Infusions: . cefTRIAXone (ROCEPHIN)  IV Stopped (01/13/18 0948)  . metronidazole 500 mg (01/14/18 0423)   PRN Meds: acetaminophen **OR** [DISCONTINUED] acetaminophen, colchicine, guaiFENesin, hydrOXYzine, ondansetron **OR** ondansetron (ZOFRAN) IV   Vital Signs    Vitals:   01/14/18 0119 01/14/18 0436 01/14/18 0747 01/14/18 0748  BP: (!) 104/50 (!) 114/59    Pulse:  62    Resp:  16    Temp:      TempSrc:      SpO2: 100% 100% (!) 85% 94%  Weight:  194 lb 3.6 oz (88.1 kg)    Height:        Intake/Output Summary (Last 24 hours) at 01/14/2018 1016 Last data filed at 01/13/2018 2300 Gross per 24 hour  Intake 820 ml  Output 2825 ml  Net -2005 ml   Filed Weights   01/12/18 1626 01/14/18 0436  Weight: 199 lb 8.3 oz (90.5 kg) 194 lb 3.6 oz (88.1 kg)    Telemetry    Paced rhythm, 60s - Personally Reviewed  ECG    N/A not performed today - Personally Reviewed  Physical Exam   GEN: No acute distress. Elderly white male   Neck: No JVD Cardiac: RRR, no murmurs, rubs, or gallops.  Respiratory: decreased BS at the  bases bilaterally. GI: Soft, nontender, non-distended  MS: No edema; No deformity. Neuro:  Nonfocal  Psych: Normal affect   Labs    Chemistry Recent Labs  Lab 01/12/18 0306 01/13/18 0523 01/14/18 0756  NA 142 143 145  K 4.1 3.6 3.7  CL 107 106 105  CO2 26 32 32  GLUCOSE 103* 91 98  BUN 55* 43* 35*  CREATININE 1.54* 1.13 1.10  CALCIUM 8.7* 8.6* 8.6*  PROT  --  6.7 6.4*  ALBUMIN  --  3.1* 2.9*  AST  --  104* 75*  ALT  --  117* 104*  ALKPHOS  --  91 90  BILITOT  --  0.8 0.8  GFRNONAA 40* 58* 60*  GFRAA 46* >60 >60  ANIONGAP 9 5 8      Hematology Recent Labs  Lab 01/12/18 0306 01/12/18 1540 01/13/18 0523 01/14/18 0756  WBC 4.3  --  3.3* 3.6*  RBC 2.26* 3.62* 2.37* 2.42*  HGB 8.1*  --  8.4* 8.7*  HCT 25.8*  --  27.2* 27.8*  MCV 114.2*  --  114.8* 114.9*  MCH 35.8*  --  35.4* 36.0*  MCHC 31.4  --  30.9 31.3  RDW 17.6*  --  17.7* 17.5*  PLT 164  --  165 172    Cardiac Enzymes Recent Labs  Lab 01/12/18 0931 01/12/18 1540 01/12/18 2340  TROPONINI <0.03 0.03* <0.03    Recent Labs  Lab 01/12/18 0323  TROPIPOC 0.01     BNP Recent Labs  Lab 01/12/18 0306  BNP 1,360.9*     DDimer No results for input(s): DDIMER in the last 168 hours.   Radiology    No results found.  Cardiac Studies   09/21/2017 Echocardiogram: Study Conclusions - Left ventricle: The cavity size was moderately dilated. There was mild concentric hypertrophy. Systolic function was severely reduced. The estimated ejection fraction was in the range of 25% to 30%. The study is not technically sufficient to allow evaluation of LV diastolic function. - Mitral valve: There was mild regurgitation. - Left atrium: The atrium was moderately dilated. - Right ventricle: The cavity size was moderately dilated. - Right atrium: The atrium was moderately dilated. - Atrial septum: No defect or patent foramen ovale was identified. - Tricuspid valve: There was severe regurgitation. -  Pulmonary arteries: PA peak pressure: 111 mm Hg (S).  Notes recorded by [name], MD on 09/22/2017 at 4:42 PM I disagree with the report. I do not think the EF is 25-30%, but rather 40-45%, close to his previous baseline. There is however profound dyssynchrony of contraction due to RV pacing (which is occurring with increasing frequency due to slow heart rates (although none of his meds slow the heart rate). The biggest problem remains severe pulmonary HTN and severe tricuspid insufficiency. These are related to obesity, OSA and probably the pacemaker lead across the tricuspid valve.  Repeat Echo 01/13/18  Study Conclusions  - Left ventricle: The cavity size was normal. Systolic function was   severely reduced. The estimated ejection fraction was in the   range of 25% to 30%. There is akinesis of the   mid-apicalanteroseptal and inferoseptal myocardium. The study is   not technically sufficient to allow evaluation of LV diastolic   function. - Aortic valve: Trileaflet; moderately thickened, moderately   calcified leaflets. Transvalvular velocity was minimally   increased. There was no stenosis. There was mild regurgitation.   Peak velocity (S): 233 cm/s. Valve area (VTI): 1.49 cm^2. Valve   area (Vmax): 1.6 cm^2. Valve area (Vmean): 1.44 cm^2. - Mitral valve: There was mild regurgitation. - Left atrium: The atrium was severely dilated. - Right ventricle: The cavity size was moderately dilated. Wall   thickness was normal. Pacer wire or catheter noted in right   ventricle. - Right atrium: The atrium was severely dilated. - Tricuspid valve: There was moderate regurgitation. - Pulmonary arteries: Systolic pressure was moderately increased.   PA peak pressure: 53 mm Hg (S).  Patient Profile     84 y.o. male with a hx of nonischemic cardiomyopathy (EF40-45%),right sided heart failure, PAH, OSA, severe tricuspid regurgitation,permanent atrial fibrillation with slow ventricular  response, single chamber pacemaker, and HTNadmitted with acute hypercapnic respiratory failure and right heart failure exacerbation, improved with BiPAP and diuretics.   Assessment & Plan    1. A/C Respiratory Failure: improved.   2. A/C Systolic HF: EF 10-62% on echo 01/13/18 w/ akinesis of the mid-apicalanteroseptal and inferoseptal myocardium. Acute exacerbation in the setting of noncompliance with diuretics. Getting IV Lasix. Good response. Acute respiratory failure improved. 2.8 L out in UOP yesterday. Net I/Os negative 5.3 L since admit. Renal function improved with diuresis. Appears  close to euvolemic state and ready for transition back to PO lasix in the next 24-48 hrs.    3. Cardiomyopathy: limited medical options due to bradycardia and hypotension. Unable to take BBs due to afib w/ SVR and unable to tolerate ACE/ARBs due to low BP. He is being managed with diuretics only. Repeat echo was performed yesterday to reassess LVEF. EF is 25-30%. Will need to consider CRT-P upgrade. Dr. [name] to see today and will weigh in on this.   3. Persistent Atrial Fibrillation: SVR, mostly paced. Continue to avoid AV nodal blocking agents. Not a good candidate for chronic anticoagulation due to fall risk and chronic anemia, despite increased embolic risk.   4. CKD: Scr improved, down from 1.54>>1.13>>1.10  5. Chronic Anemia: 8.7 today and c/w baseline. Management per primary.   6. OSA: on Bipap   7. PPM: ? Upgrade to CRT-P given chronic systolic HF with EF < 35%.     For questions or updates, please contact [place] Please consult [email or web address] for contact info under Cardiology/STEMI.      Signed, Lyda Jester, PA-C  01/14/2018, 10:16 AM    I have seen and examined the patient along with Lyda Jester, PA-C .  I have reviewed the chart, notes and new data.  I agree with PA/NP's note.  Key new complaints: feels greatly improved. Wt 194 lb. Key examination changes: marked  improvement in signs of hypervolemia - no lower extremity or scrotal swelling V waves due to severe TR are present, but average JVP 4-6 cm. Clear lungs. Key new findings / data: I have reviewed his echo. Again, I believe that the EF is better than reported, probably 35%, but there is profound septal-lateral dyssynchrony, further accentuated by the severe TR-related diastolic septal flattening. Hgb unchanged.  PLAN: Will ask EP opinion regarding his suitability for CRT-P upgrade. Can switch to oral diuretics. Probable DC tomorrow.  [name], MD, [place] [phone] 01/14/2018, 3:52 PM

## 2018-01-15 NOTE — Consult Note (Signed)
ELECTROPHYSIOLOGY CONSULT NOTE  Patient ID: [name], MRN: [id number], DOB/AGE: 1931/12/22 84 y.o. Admit date: 01/12/2018 Date of Consult: 01/15/2018  Primary Physician: [name], MD Primary Cardiologist: MCr ` `  [name] is a 84 y.o. male who is being seen today for the evaluation of consideration of CRT upgrade at the request of Dr Thermon Leyland.   Chief Complaint: cardiomyopathy    HPI [name] is a 84 y.o. male with a known mild cardiomyopathy Admitted with congestive heart failure.  His atrial fibrillation with slow ventricular response and is status post pacemaker implantation.  Ventricular pacing percentage from 3/15--8/18 with about 70% and this is been consistent when last recorded from 4/19.  Has noted over the last couple of years progressive exercise intolerance shortness of breath.   More recently has had shortness of breath and swelling which prompted his hospitalization  DATE TEST EF   2010 LHC  Normal CAs   2/16 Echo  55-60%   3/18 Echo   55-60 % Severe LAE  2/19 Echo   40-45 %   6/19 Echo  20-25%    He has a history of recurrent falls.  This prompted the discontinuation of his anticoagulation.  He also has a history of sleep apnea? Narcolepsy   Past Medical History:  Diagnosis Date  . Arthritis    "feet" (09/30/2017)  . CHF (congestive heart failure) ([place])   . Chronic bronchitis ([place])   . CKD (chronic kidney disease), stage III ([place])    [name] 09/30/2017  . Coronary artery disease   . Diverticulitis   . GERD (gastroesophageal reflux disease)   . Gout   . History of blood transfusion    "blood loss" (09/30/2017)  . Hyperlipidemia   . Hypertension   . MI (myocardial infarction) ([place]) ~ 2000   "light one"  . Nonischemic cardiomyopathy (HCC)    mild  . OSA on CPAP   . Permanent atrial fibrillation (HCC)    on Eliquis  . Presence of permanent cardiac pacemaker   . Pulmonary hypertension ([place])   . Restless legs        Surgical History:  Past Surgical History:  Procedure Laterality Date  . APPENDECTOMY  12/2000   [name] 12/22/2010  . CARDIAC CATHETERIZATION  11/07/2008   nonischemic cardiomyopathy,pulmonary hypertension  . CATARACT EXTRACTION W/ INTRAOCULAR LENS  IMPLANT, BILATERAL Bilateral   . CIRCUMCISION  12/2005   [name] 12/22/2010  . COLOSTOMY  12/2000   [name] 12/22/2010  . COLOSTOMY REVERSAL  07/2001   [name] 12/22/2010  . CORONARY ANGIOPLASTY  06/01/1999   successful to ostium of the first diagonal  . ESOPHAGOGASTRODUODENOSCOPY N/A 08/22/2013   Procedure: ESOPHAGOGASTRODUODENOSCOPY (EGD);  Surgeon: [name], MD;  Location: [facility] ENDOSCOPY;  Service: Endoscopy;  Laterality: N/A;  h/p in file cabinet, jackie  . ESOPHAGOGASTRODUODENOSCOPY N/A 11/05/2014   Procedure: ESOPHAGOGASTRODUODENOSCOPY (EGD);  Surgeon: Clarene Essex, MD;  Location: [facility] ENDOSCOPY;  Service: Endoscopy;  Laterality: N/A;  . ESOPHAGOGASTRODUODENOSCOPY N/A 11/08/2016   Procedure: ESOPHAGOGASTRODUODENOSCOPY (EGD);  Surgeon: [name], MD;  Location: [facility] ENDOSCOPY;  Service: Endoscopy;  Laterality: N/A;  . ESOPHAGOGASTRODUODENOSCOPY (EGD) WITH PROPOFOL Left 11/05/2017   Procedure: ESOPHAGOGASTRODUODENOSCOPY (EGD) WITH PROPOFOL;  Surgeon: [name], MD;  Location: [place];  Service: Endoscopy;  Laterality: Left;  . HOT HEMOSTASIS N/A 11/05/2014   Procedure: HOT HEMOSTASIS (ARGON PLASMA COAGULATION/BICAP);  Surgeon: Clarene Essex, MD;  Location: [facility] ENDOSCOPY;  Service: Endoscopy;  Laterality: N/A;  .  INSERT / REPLACE / REMOVE PACEMAKER    . JOINT REPLACEMENT    . MASS EXCISION Left    hand w/ulnar artery reconstruction/notes 12/22/2010  . NM MYOCAR PERF WALL MOTION  11/24/2007   normal  . PERMANENT PACEMAKER INSERTION  10/04/2012   [employer]  . PERMANENT PACEMAKER INSERTION N/A 10/12/2011   Procedure: PERMANENT PACEMAKER INSERTION;  Surgeon: Sanda , MD;  Location: [place] CATH LAB;  Service: Cardiovascular;   Laterality: N/A;  . REPLACEMENT TOTAL KNEE Bilateral 11/2006   right-left/notes 12/22/2010  . ROTATOR CUFF REPAIR Right 09/2004   [name] 12/22/2010  . SAVORY DILATION N/A 08/22/2013   Procedure: SAVORY DILATION;  Surgeon: [name], MD;  Location[facility] END[facility]COPY;  Service: Endoscopy;  Laterality: N/A;  . SHOULDER SURGERY Right    "fell off house; messed up 3 things in my arm"  . TONSILLECTOMY    . US ECHOCARDIOGRAPHY  02/01/2011   LA is mod-severely dilated,AOV & root sclerotic,ca+ AOV leaflets     Home Meds: Prior to Admission medications   Medication Sig Start Date End Date Taking? Authorizing Provider  acetaminophen (TYLENOL) 650 MG CR tablet Take 650 mg by mouth every 8 (eight) hours as needed for pain.   Yes [provider]  allopurinol (ZYLOPRIM) 100 MG tablet Take 2 tablets (200 mg total) by mouth daily. 11/09/16  Yes Theodis Blaze, MD  colchicine 0.6 MG tablet Take 0.6 mg by mouth daily as needed (gout flare). Colcrys   Yes [provider]  Docusate Calcium (STOOL SOFTENER PO) Take 2 tablets by mouth at bedtime.    Yes [provider]  dutasteride (AVODART) 0.5 MG capsule Take 0.5 mg by mouth daily.   Yes [provider]  escitalopram (LEXAPRO) 5 MG tablet Take 1 tablet (5 mg total) by mouth at bedtime. 11/09/16  Yes Theodis Blaze, MD  ferrous sulfate 325 (65 FE) MG tablet Take 1 tablet (325 mg total) by mouth 2 (two) times daily with a meal. 11/14/17  Yes Croitoru, Mihai, MD  furosemide (LASIX) 40 MG tablet Take 40-80 mg by mouth 2 (two) times daily. Take 80mg  by mouth in the morning and 40mg  in the afternoon    Yes [provider]  hydrOXYzine (ATARAX/VISTARIL) 25 MG tablet TAKE 1 TABLET BY MOUTH EVERY 6 HOURS AS NEEDED FOR  ITCHING 01/11/18  Yes Croitoru, Mihai, MD  mirtazapine (REMERON) 15 MG tablet Take 15 mg by mouth at bedtime.   Yes [provider]  Multiple Vitamin (MULTIVITAMIN WITH MINERALS) TABS tablet Take 1 tablet by mouth  daily.   Yes [provider]  Multiple Vitamins-Minerals (PRESERVISION AREDS 2) CAPS Take 1 capsule by mouth daily.   Yes [provider]  pantoprazole (PROTONIX) 40 MG tablet Take 1 tablet (40 mg total) by mouth daily. Patient taking differently: Take 40 mg by mouth 2 (two) times daily.  11/08/17  Yes Nita Sells, MD  potassium chloride (K-DUR) 10 MEQ tablet Take 1 tablet (10 mEq total) by mouth daily. 03/17/17  Yes Croitoru, Mihai, MD  rOPINIRole (REQUIP) 3 MG tablet Take 3 mg by mouth at bedtime.  08/02/15  Yes [provider]  Tamsulosin HCl (FLOMAX) 0.4 MG CAPS Take 0.4 mg by mouth daily after breakfast.   Yes [provider]  traMADol (ULTRAM) 50 MG tablet Take 100 mg by mouth 2 (two) times daily. For pain    Yes [provider]  Amino Acids-Protein Hydrolys (FEEDING SUPPLEMENT, PRO-STAT SUGAR FREE 64,) LIQD  Take 30 mLs by mouth 2 (two) times daily. Patient not taking: Reported on 01/12/2018 10/02/17   Jani Gravel, MD  apixaban (ELIQUIS) 5 MG TABS tablet Take 1 tablet (5 mg total) by mouth 2 (two) times daily. Patient taking differently: Take 2.5 mg by mouth 2 (two) times daily.  11/10/17   Nita Sells, MD    Inpatient Medications:  . allopurinol  200 mg Oral Daily  . chlorhexidine  15 mL Mouth Rinse BID  . Chlorhexidine Gluconate Cloth  6 each Topical Q0600  . dutasteride  0.5 mg Oral Daily  . escitalopram  5 mg Oral QHS  . ferrous sulfate  325 mg Oral BID WC  . furosemide  60 mg Intravenous BID  . levalbuterol  0.63 mg Nebulization TID  . mouth rinse  15 mL Mouth Rinse q12n4p  . mirtazapine  15 mg Oral QHS  . multivitamin  1 tablet Oral Daily  . mupirocin ointment  1 application Nasal BID  . pantoprazole  40 mg Oral Daily  . rOPINIRole  3 mg Oral QHS  . tamsulosin  0.4 mg Oral QPC breakfast      Allergies:  Allergies  Allergen Reactions  . Vicodin [Hydrocodone-Acetaminophen] Itching and Nausea Only    Social History    Socioeconomic History  . Marital status: Married    Spouse name: Not on file  . Number of children: Not on file  . Years of education: Not on file  . Highest education level: Not on file  Occupational History  . Occupation: retired  Scientific laboratory technician  . Financial resource strain: Not on file  . Food insecurity:    Worry: Not on file    Inability: Not on file  . Transportation needs:    Medical: Not on file    Non-medical: Not on file  Tobacco Use  . Smoking status: Former Research scientist (life sciences)  . Smokeless tobacco: Never Used  . Tobacco comment: 09/30/2017 "it's been over 64yr since I smoked anything"  Substance and Sexual Activity  . Alcohol use: No  . Drug use: No  . Sexual activity: Never  Lifestyle  . Physical activity:    Days per week: Not on file    Minutes per session: Not on file  . Stress: Not on file  Relationships  . Social connections:    Talks on phone: Not on file    Gets together: Not on file    Attends religious service: Not on file    Active member of club or organization: Not on file    Attends meetings of clubs or organizations: Not on file    Relationship status: Not on file  . Intimate partner violence:    Fear of current or ex partner: Not on file    Emotionally abused: Not on file    Physically abused: Not on file    Forced sexual activity: Not on file  Other Topics Concern  . Not on file  Social History Narrative  . Not on file     Family History  Problem Relation Age of Onset  . Cancer Mother   . Heart attack Father   . Diabetes Brother      ROS:  Please see the history of present illness.     All other systems reviewed and negative.    Physical Exam: Blood pressure (!) 105/56, pulse 60, temperature 98.6 F (37 C), temperature source Oral, resp. rate 16, height 5\' 7"  (1.702 m), weight 191 lb 3.2 oz (86.7 kg),  SpO2 100 %. General: Well developed, well nourished male in no acute distress. Head: Normocephalic, atraumatic, sclera non-icteric, no  xanthomas, nares are without discharge. EENT: normal Lymph Nodes:  none Back: without scoliosis/kyphosis, no CVA tendersness Neck: Negative for carotid bruits. JVD not elevated. Lungs: Clear bilaterally to auscultation without wheezes, rales, or rhonchi. Breathing is unlabored. Heart: RRR with S1 S2.  2/6 systolic  murmur , rubs, or gallops appreciated. Abdomen: Soft, non-tender, non-distended with normoactive bowel sounds. No hepatomegaly. No rebound/guarding. No obvious abdominal masses. Msk:  Strength and tone appear normal for age. Extremities: No clubbing or cyanosis. No edema.  Distal pedal pulses are 2+ and equal bilaterally. Skin: Warm and Dry Neuro: Alert and oriented X 3. CN III-XII intact Grossly normal sensory and motor function . Psych:  Responds to questions appropriately with a normal affect.      Labs: Cardiac Enzymes Recent Labs    01/12/18 1540 01/12/18 2340  TROPONINI 0.03* <0.03   CBC Lab Results  Component Value Date   WBC 3.6 (L) 01/14/2018   HGB 8.7 (L) 01/14/2018   HCT 27.8 (L) 01/14/2018   MCV 114.9 (H) 01/14/2018   PLT 172 01/14/2018   PROTIME: No results for input(s): LABPROT, INR in the last 72 hours. Chemistry  Recent Labs  Lab 01/14/18 0756  NA 145  K 3.7  CL 105  CO2 32  BUN 35*  CREATININE 1.10  CALCIUM 8.6*  PROT 6.4*  BILITOT 0.8  ALKPHOS 90  ALT 104*  AST 75*  GLUCOSE 98   Lipids Lab Results  Component Value Date   CHOL 117 11/04/2017   HDL 41 11/04/2017   LDLCALC 64 11/04/2017   TRIG 61 11/04/2017   BNP Pro B Natriuretic peptide (BNP)  Date/Time Value Ref Range Status  11/04/2008 06:57 PM 464.0 (H) 0.0 - 100.0 pg/mL Final   Thyroid Function Tests: No results for input(s): TSH, T4TOTAL, T3FREE, THYROIDAB in the last 72 hours.  Invalid input(s): FREET3    Miscellaneous Lab Results  Component Value Date   DDIMER  11/04/2008    <0.22        AT THE INHOUSE ESTABLISHED CUTOFF VALUE OF 0.48 ug/mL FEU, THIS  ASSAY HAS BEEN DOCUMENTED IN THE LITERATURE TO HAVE A SENSITIVITY AND NEGATIVE PREDICTIVE VALUE OF AT LEAST 98 TO 99%.  THE TEST RESULT SHOULD BE CORRELATED WITH AN ASSESSMENT OF THE CLINICAL PROBABILITY OF DVT / VTE.    Radiology/Studies:  Ct Abdomen Pelvis Wo Contrast  Result Date: 01/10/2018 CLINICAL DATA:  Abdominal pain x 1-2 weeks, history of colostomy status post reversal EXAM: CT ABDOMEN AND PELVIS WITHOUT CONTRAST TECHNIQUE: Multidetector CT imaging of the abdomen and pelvis was performed following the standard protocol without IV contrast. COMPARISON:  03/01/2008 FINDINGS: Motion degraded images. Lower chest: Small right and trace left pleural effusions. Hepatobiliary: Unenhanced liver is unremarkable. Gallbladder is unremarkable. No intrahepatic or extrahepatic ductal dilatation. Pancreas: Grossly unremarkable. Spleen: Within normal limits. Adrenals/Urinary Tract: Adrenal glands are within normal limits. Bilateral renal cysts, measuring up to 6.2 cm in the posterior interpolar left kidney. No renal calculi or hydronephrosis. Bladder is within normal limits. Stomach/Bowel: Stomach is within normal limits. No evidence of bowel obstruction. However, there is nonspecific stranding in the right upper abdomen which is favored to be localized along the duodenum (coronal image 58), favoring duodenitis over pancreatitis. Appendix is not discretely visualized and is reportedly surgically absent. Status post partial colectomy with suture line in the right mid abdomen (series 2/image  57). Scattered colonic diverticulosis, without evidence of diverticulitis. Vascular/Lymphatic: No evidence of abdominal aortic aneurysm. Atherosclerotic calcifications of the abdominal aorta and branch vessels. No suspicious abdominopelvic lymphadenopathy. Reproductive: Enlargement of the central gland, suggesting BPH. Other: Trace perihepatic ascites. Mild mesenteric fluid/stranding. Trace presacral fluid. Mild body wall  edema. Postsurgical changes along the anterior abdominal wall. Small right paramidline ventral hernia containing a knuckle of small bowel (series 2/image 39). Small right and moderate left fat containing inguinal hernias, with trace fluid on the left. Musculoskeletal: Degenerative changes of the visualized thoracolumbar spine. IMPRESSION: Motion degraded images. Inflammatory changes centered in the right upper quadrant, favoring duodenitis, less likely pancreatitis. No evidence of bowel obstruction. Status post partial colectomy. Prior appendectomy. No peripancreatic fluid collections. Small right and trace left pleural effusions. Trace perihepatic and presacral ascites. Body wall edema. Electronically Signed   By: Julian Hy M.D.   On: 01/10/2018 13:09   Dg Chest 2 View  Result Date: 01/12/2018 CLINICAL DATA:  Shortness of breath. EXAM: CHEST - 2 VIEW COMPARISON:  11/03/2017 FINDINGS: Marked cardiomegaly, similar to prior exam. Single lead pacemaker remains in place. Fine interstitial opacities suggest pulmonary edema. Small bilateral pleural effusions. No focal airspace disease or pneumothorax. Chronic change about shoulders, the bones are under mineralized. IMPRESSION: Cardiomegaly. Increased interstitial opacities suggest pulmonary edema. Electronically Signed   By: Jeb Levering M.D.   On: 01/12/2018 03:26   Ct Head Wo Contrast  Result Date: 01/12/2018 CLINICAL DATA:  Headache EXAM: CT HEAD WITHOUT CONTRAST TECHNIQUE: Contiguous axial images were obtained from the base of the skull through the vertex without intravenous contrast. COMPARISON:  November 03, 2017 and April 05, 2017 FINDINGS: Brain: Mild diffuse atrophy is stable. There is no intracranial mass, hemorrhage, extra-axial fluid collection, or midline shift. There is patchy small vessel disease in the centra semiovale bilaterally. There is a small lacunar infarct in the posterior limb of the left internal capsule, stable. There is also a  small prior lacunar infarct in the anterior limb of the left internal capsule. There is no appreciable new gray-white compartment lesion. No acute infarct is demonstrable. Vascular: There is no appreciable hyperdense vessel. There is calcification in each carotid siphon region. Skull: The bony calvarium a appears intact. Sinuses/Orbits: There is mucosal thickening in several ethmoid air cells. Other paranasal sinuses are clear. There is rightward deviation of the nasal septum. Orbits appear symmetric bilaterally. Other: Mastoid air cells are clear. IMPRESSION: Stable atrophy with supratentorial small vessel disease. No evident acute infarct. No mass or hemorrhage. There are foci of arterial vascular calcification. There is mucosal thickening in several ethmoid air cells. There is rightward deviation of the nasal septum. Electronically Signed   By: Lowella Grip III M.D.   On: 01/12/2018 08:42    EKG: Atrial fibrillation with underlying ventricular pacing   Assessment and Plan:  Atrial fibrillation-permanent  Bradycardia   Pacemaker-[employer] with V pacing percentage greater than 70  Cardiomyopathy-progressive presumed nonischemic  Congestive heart failure-acute on chronic-systolic grade 1O-1W  Falls-recurrent  Sleep apnea    The patient has had significant progression of left ventricular dysfunction over the last 18 months.  This may represent pacemaker associated cardiomyopathy which occurs at a constant percentage at least over the first 5 years prior studies from the [facility].  It is unlikely that he has developed intercurrent coronary artery disease, but will discuss with Dr. Thermon Leyland as to whether excluding this prior to proceeding with an invasive procedure and upgrade risks as to  how best to exclude.  He is euvolemic today.  We will check orthostatics today given his history of recurrent falls.  Virl Axe

## 2018-01-15 NOTE — Progress Notes (Signed)
[place] TEAM 1 - Stepdown/ICU TEAM  [name]  RDE:[id number] DOB: 09-28-1931 DOA: 01/12/2018 PCP: [name], MD    Brief Narrative:  84yo M w/ a hx of chronic systolic CHF (EF 63-14%), atrial fibrillation on Eliquis, CKD stage III, CAD, obstructive sleep apnea on CPAP, and s/p pacemaker who presented to the ED w/ worsening edema.  Patient had been taking half the dose of his prescribed Lasix to prevent from running out while out of town on vacation. Patient also reported intermittent black tarry stools w/ abdom pain.  Significant Events: 6/6 admit  Subjective: Resting comfortably in bed.  No new complaints.  Assessment & Plan:  Acute encephalopathy secondary to hypercapnic respiratory failure Due to congestive heart failure compounded by obstructive sleep apnea - resolved    Acute exacerbation of chronic systolic CHF - R sided heart failure - PAH - severe Tricuspid regurg TTE 01/13/18 noted EF 25-30%, which represents a signif decline in his LV fxn - Cardiology is following - acute exac due to noncompliance w/ full lasix dose - tx as per Cards w/ planned discussion w/ Cards/EP on timing of device upgrade +/- CAD eval  Acute duodenitis No evidence of ongoing GI bleeding - on empiric rocephin and flagyl - clinically much improved - plan to d/c abx after 5 days of tx   Macrocytic anemia  baseline hemoglobin reportedly 8.6 > 9.8 - EGD 3/30 noted angiodysplasia status post fulguration and a hiatal hernia - B12 normal - folate normal - is not Fe deficient, though Fe indices are borderline c/w anemia of chronic disease - macrocytosis may simply reflect recovery w/ Fe tx, but could also be indicative of a marrow issue (MDS, MM, etc) - will need f/u as outpt   Recent Labs  Lab 01/12/18 0306 01/13/18 0523 01/14/18 0756  HGB 8.1* 8.4* 8.7*    Chronic kidney disease stage III baseline creatinine around 1.4 - crt presently better than his baseline  Recent Labs  Lab  01/12/18 0306 01/13/18 0523 01/14/18 0756  CREATININE 1.54* 1.13 1.10    BPH continue Flomax  Dementia Stable   Chronic Atrial fibrillation w/ SVR Off anticoag due to multiple frequent falls - HR stable   OSA on CPAP Cont CPAP per home regimen   MRSA screen +   DVT prophylaxis: SCDs Code Status: FULL CODE Family Communication:  Disposition Plan: tele bed    Consultants:  Cardiology   Antimicrobials:  Rocephin 6/6> Flagyl 6/6>  Objective: Blood pressure 134/71, pulse 60, temperature 98.2 F (36.8 C), temperature source Oral, resp. rate 17, height 5\' 7"  (1.702 m), weight 86.7 kg (191 lb 3.2 oz), SpO2 100 %.  Intake/Output Summary (Last 24 hours) at 01/15/2018 1638 Last data filed at 01/15/2018 1200 Gross per 24 hour  Intake 800 ml  Output 1450 ml  Net -650 ml   Filed Weights   01/12/18 1626 01/14/18 0436 01/15/18 0314  Weight: 90.5 kg (199 lb 8.3 oz) 88.1 kg (194 lb 3.6 oz) 86.7 kg (191 lb 3.2 oz)    Examination: General: No acute respiratory distress  Lungs: Clear to auscultation B - no wheezing  Cardiovascular: Regular rate without murmur  Abdomen: NT/ND, soft, bs+, no mass  Extremities: trace edema B LE   CBC: Recent Labs  Lab 01/12/18 0306 01/13/18 0523 01/14/18 0756  WBC 4.3 3.3* 3.6*  HGB 8.1* 8.4* 8.7*  HCT 25.8* 27.2* 27.8*  MCV 114.2* 114.8* 114.9*  PLT 164 165 970   Basic Metabolic  Panel: Recent Labs  Lab 01/12/18 0306 01/12/18 0931 01/13/18 0523 01/14/18 0756  NA 142  --  143 145  K 4.1  --  3.6 3.7  CL 107  --  106 105  CO2 26  --  32 32  GLUCOSE 103*  --  91 98  BUN 55*  --  43* 35*  CREATININE 1.54*  --  1.13 1.10  CALCIUM 8.7*  --  8.6* 8.6*  MG  --  2.2  --   --    GFR: Estimated Creatinine Clearance: 52.5 mL/min (by C-G formula based on SCr of 1.1 mg/dL).  Liver Function Tests: Recent Labs  Lab 01/13/18 0523 01/14/18 0756  AST 104* 75*  ALT 117* 104*  ALKPHOS 91 90  BILITOT 0.8 0.8  PROT 6.7 6.4*  ALBUMIN  3.1* 2.9*    Recent Labs  Lab 01/12/18 0931  AMMONIA 41*    Cardiac Enzymes: Recent Labs  Lab 01/12/18 0931 01/12/18 1540 01/12/18 2340  TROPONINI <0.03 0.03* <0.03    HbA1C: Hgb A1c MFr Bld  Date/Time Value Ref Range Status  01/12/2018 09:31 AM 5.2 4.8 - 5.6 % Final    Comment:    (NOTE) Pre diabetes:          5.7%-6.4% Diabetes:              >6.4% Glycemic control for   <7.0% adults with diabetes   11/03/2017 04:07 PM 4.5 (L) 4.8 - 5.6 % Final    Comment:    (NOTE) Pre diabetes:          5.7%-6.4% Diabetes:              >6.4% Glycemic control for   <7.0% adults with diabetes      Recent Results (from the past 240 hour(s))  MRSA PCR Screening     Status: Abnormal   Collection Time: 01/12/18  4:56 PM  Result Value Ref Range Status   MRSA by PCR POSITIVE (A) NEGATIVE Final    Comment:        The GeneXpert MRSA Assay (FDA approved for NASAL specimens only), is one component of a comprehensive MRSA colonization surveillance program. It is not intended to diagnose MRSA infection nor to guide or monitor treatment for MRSA infections. RESULT CALLED TO, READ BACK BY AND VERIFIED WITHMurvin Natal RN 1610 01/12/18 A BROWNING Performed at [facility], [place] [address] [place], [place] [address]      Scheduled Meds: . allopurinol  200 mg Oral Daily  . chlorhexidine  15 mL Mouth Rinse BID  . Chlorhexidine Gluconate Cloth  6 each Topical Q0600  . dutasteride  0.5 mg Oral Daily  . escitalopram  5 mg Oral QHS  . ferrous sulfate  325 mg Oral BID WC  . furosemide  60 mg Intravenous BID  . levalbuterol  0.63 mg Nebulization TID  . mouth rinse  15 mL Mouth Rinse q12n4p  . mirtazapine  15 mg Oral QHS  . multivitamin  1 tablet Oral Daily  . mupirocin ointment  1 application Nasal BID  . pantoprazole  40 mg Oral Daily  . rOPINIRole  3 mg Oral QHS  . tamsulosin  0.4 mg Oral QPC breakfast     LOS: 3 days   Cherene Altes, MD [facility] Office   [phone] Pager - Text Page per Amion as per below:  On-Call/Text Page:      [email or web address]      password [facility]  If 7PM-7AM, please contact night-coverage [email or web address] Password [facility] 01/15/2018, 4:38 PM

## 2018-01-16 ENCOUNTER — Telehealth: Payer: Self-pay | Admitting: Cardiovascular Disease

## 2018-01-16 DIAGNOSIS — I5023 Acute on chronic systolic (congestive) heart failure: Secondary | ICD-10-CM

## 2018-01-16 NOTE — Progress Notes (Signed)
[name] TEAM 1 - Stepdown/ICU TEAM  TOMOYA RINGWALD  GUR:[id number] DOB: 1932/02/25 DOA: 01/12/2018 PCP: [name], MD    Brief Narrative:  84yo M w/ a hx of chronic systolic CHF (EF 28-31%), atrial fibrillation on Eliquis, CKD stage III, CAD, obstructive sleep apnea on CPAP, and s/p pacemaker who presented to the ED w/ worsening edema.  Patient had been taking half the dose of his prescribed Lasix to prevent from running out while out of town on vacation. Patient also reported intermittent black tarry stools w/ abdom pain.  Significant Events: 6/6 admit  Subjective: All active issues today are being addressed by the Cardiology Service.  No changes today in regard to medical management.    Assessment & Plan:  Acute encephalopathy secondary to hypercapnic respiratory failure Due to congestive heart failure compounded by obstructive sleep apnea - resolved    Acute exacerbation of chronic systolic CHF - R sided heart failure - PAH - severe Tricuspid regurg TTE 01/13/18 noted EF 25-30%, which represents a signif decline in his LV fxn - Cardiology is following - acute exac due to noncompliance w/ full lasix dose - tx as per Cards w/ planned discussion w/ Cards/EP on timing of device upgrade +/- CAD eval  Acute duodenitis No evidence of ongoing GI bleeding - on empiric rocephin and flagyl - clinically much improved - plan to d/c abx after 5 days of tx (today)  Macrocytic anemia  baseline hemoglobin reportedly 8.6 > 9.8 - EGD 3/30 noted angiodysplasia status post fulguration and a hiatal hernia - B12 normal - folate normal - is not Fe deficient, though Fe indices are borderline c/w anemia of chronic disease - macrocytosis may simply reflect recovery w/ Fe tx, but could also be indicative of a marrow issue (MDS, MM, etc) - will need f/u as outpt   Recent Labs  Lab 01/12/18 0306 01/13/18 0523 01/14/18 0756  HGB 8.1* 8.4* 8.7*    Chronic kidney disease stage III baseline  creatinine around 1.4 - crt presently better than his baseline  Recent Labs  Lab 01/12/18 0306 01/13/18 0523 01/14/18 0756  CREATININE 1.54* 1.13 1.10    BPH continue Flomax  Dementia Stable   Chronic Atrial fibrillation w/ SVR Off anticoag due to multiple frequent falls - HR stable   OSA on CPAP Cont CPAP per home regimen   MRSA screen +   DVT prophylaxis: SCDs Code Status: FULL CODE Family Communication:  Disposition Plan: tele bed    Consultants:  Cardiology   Antimicrobials:  Rocephin 6/6 > 6/10 Flagyl 6/6 > 6/10  Objective: Blood pressure (!) 147/85, pulse 61, temperature 97.6 F (36.4 C), temperature source Oral, resp. rate 17, height 5\' 7"  (1.702 m), weight 86.6 kg (191 lb), SpO2 99 %.  Intake/Output Summary (Last 24 hours) at 01/16/2018 1415 Last data filed at 01/16/2018 1100 Gross per 24 hour  Intake 1220 ml  Output 2400 ml  Net -1180 ml   Filed Weights   01/14/18 0436 01/15/18 0314 01/16/18 0538  Weight: 88.1 kg (194 lb 3.6 oz) 86.7 kg (191 lb 3.2 oz) 86.6 kg (191 lb)    Examination: No exam today   CBC: Recent Labs  Lab 01/12/18 0306 01/13/18 0523 01/14/18 0756  WBC 4.3 3.3* 3.6*  HGB 8.1* 8.4* 8.7*  HCT 25.8* 27.2* 27.8*  MCV 114.2* 114.8* 114.9*  PLT 164 165 517   Basic Metabolic Panel: Recent Labs  Lab 01/12/18 0306 01/12/18 0931 01/13/18 0523 01/14/18 0756  NA  142  --  143 145  K 4.1  --  3.6 3.7  CL 107  --  106 105  CO2 26  --  32 32  GLUCOSE 103*  --  91 98  BUN 55*  --  43* 35*  CREATININE 1.54*  --  1.13 1.10  CALCIUM 8.7*  --  8.6* 8.6*  MG  --  2.2  --   --    GFR: Estimated Creatinine Clearance: 52.5 mL/min (by C-G formula based on SCr of 1.1 mg/dL).  Liver Function Tests: Recent Labs  Lab 01/13/18 0523 01/14/18 0756  AST 104* 75*  ALT 117* 104*  ALKPHOS 91 90  BILITOT 0.8 0.8  PROT 6.7 6.4*  ALBUMIN 3.1* 2.9*    Recent Labs  Lab 01/12/18 0931  AMMONIA 41*    Cardiac Enzymes: Recent Labs    Lab 01/12/18 0931 01/12/18 1540 01/12/18 2340  TROPONINI <0.03 0.03* <0.03    HbA1C: Hgb A1c MFr Bld  Date/Time Value Ref Range Status  01/12/2018 09:31 AM 5.2 4.8 - 5.6 % Final    Comment:    (NOTE) Pre diabetes:          5.7%-6.4% Diabetes:              >6.4% Glycemic control for   <7.0% adults with diabetes   11/03/2017 04:07 PM 4.5 (L) 4.8 - 5.6 % Final    Comment:    (NOTE) Pre diabetes:          5.7%-6.4% Diabetes:              >6.4% Glycemic control for   <7.0% adults with diabetes      Recent Results (from the past 240 hour(s))  MRSA PCR Screening     Status: Abnormal   Collection Time: 01/12/18  4:56 PM  Result Value Ref Range Status   MRSA by PCR POSITIVE (A) NEGATIVE Final    Comment:        The GeneXpert MRSA Assay (FDA approved for NASAL specimens only), is one component of a comprehensive MRSA colonization surveillance program. It is not intended to diagnose MRSA infection nor to guide or monitor treatment for MRSA infections. RESULT CALLED TO, READ BACK BY AND VERIFIED WITHMurvin Natal RN [phone] 01/12/18 A BROWNING Performed at [facility], [place] [address], [place], [place] [address]      Scheduled Meds: . allopurinol  200 mg Oral Daily  . chlorhexidine  15 mL Mouth Rinse BID  . Chlorhexidine Gluconate Cloth  6 each Topical Q0600  . dutasteride  0.5 mg Oral Daily  . escitalopram  5 mg Oral QHS  . ferrous sulfate  325 mg Oral BID WC  . furosemide  60 mg Intravenous BID  . levalbuterol  0.63 mg Nebulization TID  . mouth rinse  15 mL Mouth Rinse q12n4p  . mirtazapine  15 mg Oral QHS  . multivitamin  1 tablet Oral Daily  . mupirocin ointment  1 application Nasal BID  . pantoprazole  40 mg Oral Daily  . rOPINIRole  3 mg Oral QHS  . tamsulosin  0.4 mg Oral QPC breakfast     LOS: 4 days   Cherene Altes, MD [facility] Office  [phone] Pager - Text Page per Amion as per below:  On-Call/Text Page:      [email or web address]       password [email or web address]  If 7PM-7AM, please contact night-coverage [email or web address] Password [email or web address] 01/16/2018, 2:15 PM

## 2018-01-16 NOTE — Plan of Care (Signed)
Pt verbalizes understanding of prescribed IV ABX, compliant with use of call light to request assistance.  Edward Qualia RN

## 2018-01-16 NOTE — Telephone Encounter (Signed)
Unfortunately, I am not sure we will be able to do that logistically, but I will send the request to the performing EP team. [facility]

## 2018-01-16 NOTE — Progress Notes (Addendum)
Progress Note  Patient Name: [name] Date of Encounter: 01/16/2018  Primary Cardiologist: [name], MD   Subjective   Pt laying almost flat on my assessment. No chest pain, shortness of breath or orthopnea.   Inpatient Medications    Scheduled Meds: . allopurinol  200 mg Oral Daily  . chlorhexidine  15 mL Mouth Rinse BID  . Chlorhexidine Gluconate Cloth  6 each Topical Q0600  . dutasteride  0.5 mg Oral Daily  . escitalopram  5 mg Oral QHS  . ferrous sulfate  325 mg Oral BID WC  . furosemide  60 mg Intravenous BID  . levalbuterol  0.63 mg Nebulization TID  . mouth rinse  15 mL Mouth Rinse q12n4p  . mirtazapine  15 mg Oral QHS  . multivitamin  1 tablet Oral Daily  . mupirocin ointment  1 application Nasal BID  . pantoprazole  40 mg Oral Daily  . rOPINIRole  3 mg Oral QHS  . tamsulosin  0.4 mg Oral QPC breakfast   Continuous Infusions: . cefTRIAXone (ROCEPHIN)  IV Stopped (01/15/18 0903)  . metronidazole Stopped (01/16/18 0519)   PRN Meds: acetaminophen **OR** [DISCONTINUED] acetaminophen, colchicine, guaiFENesin, hydrOXYzine, ondansetron **OR** ondansetron (ZOFRAN) IV, traMADol   Vital Signs    Vitals:   01/15/18 2105 01/15/18 2315 01/16/18 0538 01/16/18 0717  BP: 116/75  (!) 109/59 (!) 144/69  Pulse: 61 60 60 64  Resp: 18 (!) 21 14 17   Temp:    98.2 F (36.8 C)  TempSrc:    Axillary  SpO2: 100% 99% 95% 100%  Weight:   191 lb (86.6 kg)   Height:        Intake/Output Summary (Last 24 hours) at 01/16/2018 0923 Last data filed at 01/16/2018 0519 Gross per 24 hour  Intake 1220 ml  Output 2400 ml  Net -1180 ml   Filed Weights   01/14/18 0436 01/15/18 0314 01/16/18 0538  Weight: 194 lb 3.6 oz (88.1 kg) 191 lb 3.2 oz (86.7 kg) 191 lb (86.6 kg)    Telemetry    Mostly V paving at 60 bpm - Personally Reviewed  ECG    No new tracings - Personally Reviewed  Physical Exam   GEN: No acute distress.   Neck: No JVD Cardiac: RRR, no murmurs,  rubs, or gallops.  Respiratory: Clear to auscultation bilaterally. GI: Soft, nontender, non-distended  MS: No edema; No deformity. Neuro:  Nonfocal  Psych: Normal affect   Labs    Chemistry Recent Labs  Lab 01/12/18 0306 01/13/18 0523 01/14/18 0756  NA 142 143 145  K 4.1 3.6 3.7  CL 107 106 105  CO2 26 32 32  GLUCOSE 103* 91 98  BUN 55* 43* 35*  CREATININE 1.54* 1.13 1.10  CALCIUM 8.7* 8.6* 8.6*  PROT  --  6.7 6.4*  ALBUMIN  --  3.1* 2.9*  AST  --  104* 75*  ALT  --  117* 104*  ALKPHOS  --  91 90  BILITOT  --  0.8 0.8  GFRNONAA 40* 58* 60*  GFRAA 46* >60 >60  ANIONGAP 9 5 8      Hematology Recent Labs  Lab 01/12/18 0306 01/12/18 1540 01/13/18 0523 01/14/18 0756  WBC 4.3  --  3.3* 3.6*  RBC 2.26* 3.62* 2.37* 2.42*  HGB 8.1*  --  8.4* 8.7*  HCT 25.8*  --  27.2* 27.8*  MCV 114.2*  --  114.8* 114.9*  MCH 35.8*  --  35.4* 36.0*  MCHC  31.4  --  30.9 31.3  RDW 17.6*  --  17.7* 17.5*  PLT 164  --  165 172    Cardiac Enzymes Recent Labs  Lab 01/12/18 0931 01/12/18 1540 01/12/18 2340  TROPONINI <0.03 0.03* <0.03    Recent Labs  Lab 01/12/18 0323  TROPIPOC 0.01     BNP Recent Labs  Lab 01/12/18 0306  BNP 1,360.9*     DDimer No results for input(s): DDIMER in the last 168 hours.   Radiology    No results found.  Cardiac Studies   09/21/2017 Echocardiogram: Study Conclusions - Left ventricle: The cavity size was moderately dilated. There was mild concentric hypertrophy. Systolic function was severely reduced. The estimated ejection fraction was in the range of 25% to 30%. The study is not technically sufficient to allow evaluation of LV diastolic function. - Mitral valve: There was mild regurgitation. - Left atrium: The atrium was moderately dilated. - Right ventricle: The cavity size was moderately dilated. - Right atrium: The atrium was moderately dilated. - Atrial septum: No defect or patent foramen ovale was identified. -  Tricuspid valve: There was severe regurgitation. - Pulmonary arteries: PA peak pressure: 111 mm Hg (S).  Notes recorded by [name], MD on 09/22/2017 at 4:42 PM I disagree with the report. I do not think the EF is 25-30%, but rather 40-45%, close to his previous baseline. There is however profound dyssynchrony of contraction due to RV pacing (which is occurring with increasing frequency due to slow heart rates (although none of his meds slow the heart rate). The biggest problem remains severe pulmonary HTN and severe tricuspid insufficiency. These are related to obesity, OSA and probably the pacemaker lead across the tricuspid valve.  Repeat Echo 01/13/18  Study Conclusions - Left ventricle: The cavity size was normal. Systolic function was severely reduced. The estimated ejection fraction was in the range of 25% to 30%. There is akinesis of the mid-apicalanteroseptal and inferoseptal myocardium. The study is not technically sufficient to allow evaluation of LV diastolic function. - Aortic valve: Trileaflet; moderately thickened, moderately calcified leaflets. Transvalvular velocity was minimally increased. There was no stenosis. There was mild regurgitation. Peak velocity (S): 233 cm/s. Valve area (VTI): 1.49 cm^2. Valve area (Vmax): 1.6 cm^2. Valve area (Vmean): 1.44 cm^2. - Mitral valve: There was mild regurgitation. - Left atrium: The atrium was severely dilated. - Right ventricle: The cavity size was moderately dilated. Wall thickness was normal. Pacer wire or catheter noted in right ventricle. - Right atrium: The atrium was severely dilated. - Tricuspid valve: There was moderate regurgitation. - Pulmonary arteries: Systolic pressure was moderately increased. PA peak pressure: 53 mm Hg (S).     Patient Profile     84 y.o. male with a hx of nonischemic cardiomyopathy (EF40-45%),right sided heart failure, PAH, OSA, severe tricuspid  regurgitation,permanent atrial fibrillation with slow ventricular response, single chamber pacemaker, and HTNadmitted with acute hypercapnic respiratory failure and right heart failure exacerbation, improved with BiPAP and diuretics.  Assessment & Plan    Acute on chronic systolic heart failure -significant progression of LV dysfunction over the last 18 months. Presumably non-ischemic.  -Seen by Dr. [name] for EP evaluation who felt that this may represent pacemaker associated CM. He has been V pacing about 70% of the time. Dr. [name] was going to speak with Dr. [name] regarding need for ischemic evaluation.  -Medications limited by bradycardia and hypotension. Unable to take BBs due to afib w/ SVR and unable to tolerate ACE/ARBs  due to low BP. He is being managed with diuretics only. Repeat echo was performed yesterday to reassess LVEF. EF is 25-30%. Will need to consider CRT-P upgrade.  -BNP 1360 on 6/6. Pt had been taking half of his lasix while on vacation.  -Has been diuresed with lasix 60 mg IV BID -Wt 199>191 lbs.  2.6L UOP in last 24h. Net negative 6.7L fluid balance since admission. (Wt was 186 at office visit in April) -Likely near baseline. One more day of IV lasix, plan to switch to oral dosing tomorrow. At home was on 80 mg am and 40 mg pm.   Persistent atrial fibrillation -Continues in afib with slow ventricular response. Avoiding AV nodal blocking agents.  -Not on anticoagulation due to multiple frequent falls and chronic anemia.   CKD -SCr 1.54>1.13>1.10  Chronic anemia -Hgb stable in 8 range.   OSA -On CPAP. Followed by Dr. Myrle Sheng pacemaker -Followed by Dr. [name] -Per Dr. [name]: Ventricular pacing percentage from 3/15--8/18 with about 70% and this is been consistent when last recorded from 4/19. -Possibility of converting to ICD given severe LV dysfunction, but LV dysfunction not likely related to ischemia but to V pacing. Dr. [name] has seen the  patient.  For questions or updates, please contact [place] Please consult [email or web address] for contact info under Cardiology/STEMI.      Signed, Daune Perch, NP   Pt. Seen and examined. Agree with the Resident/NP/PA-C note as written. Appears close to euvolemic. Agree the he may benefit from CRT upgrade. Seen by Dr. [name] yesterday - will touch base with EP on this. Continue IV lasix today and likely transition to oral lasix tomorrow.  Time Spent with Patient: I have spent a total of 25 minutes with patient reviewing hospital notes, telemetry, EKGs, labs and examining the patient as well as establishing an assessment and plan that was discussed with the patient. > 50% of time was spent in direct patient care.  Pixie Casino, MD, [facility], [place] Director of the Advanced Lipid Disorders &  Cardiovascular Risk Reduction Clinic Diplomate of the American Board of Clinical Lipidology Attending Cardiologist  Direct Dial: [phone]  Fax: [phone]  Website:  [email or web address]  01/16/2018, 9:23 AM

## 2018-01-16 NOTE — Telephone Encounter (Signed)
Spoke with pt wife, aware note has been sent to the EP team.

## 2018-01-16 NOTE — Telephone Encounter (Signed)
NEW MESSAGE   Patient's spouse is calling to speak with  Dr [name] about a procedure that was discussed.

## 2018-01-16 NOTE — Telephone Encounter (Addendum)
Spoke with pt wife, they are questioning about the procedure that dr [name] was going to talk with dr croitoru about. The patient is currently admitted and is wanting the procedure done ehile he is there if possible. Will forward to dr c to review and advise.

## 2018-01-16 NOTE — Progress Notes (Signed)
D/w cardiac EP - will not be able to accommodate CRT upgrade during this hospitalization. They will reach out to the patient after discharge to arrange for the procedure. Anticipate switch to oral diuretics tomorrow and probable d/c tomorrow.  Pixie Casino, MD, [facility], [place] Director of the Advanced Lipid Disorders &  Cardiovascular Risk Reduction Clinic Diplomate of the American Board of Clinical Lipidology Attending Cardiologist  Direct Dial: [phone]  Fax: [phone]  Website:  [email or web address]

## 2018-01-16 NOTE — Telephone Encounter (Signed)
Unable to reach pt or leave a message  

## 2018-01-17 ENCOUNTER — Telehealth: Payer: Self-pay | Admitting: Cardiovascular Disease

## 2018-01-17 LAB — BASIC METABOLIC PANEL
Anion gap: 4 — ABNORMAL LOW (ref 5–15)
BUN: 29 mg/dL — AB (ref 6–20)
CO2: 36 mmol/L — ABNORMAL HIGH (ref 22–32)
CREATININE: 1.03 mg/dL (ref 0.61–1.24)
Calcium: 8.5 mg/dL — ABNORMAL LOW (ref 8.9–10.3)
Chloride: 101 mmol/L (ref 101–111)
Glucose, Bld: 82 mg/dL (ref 65–99)
Potassium: 4 mmol/L (ref 3.5–5.1)
SODIUM: 141 mmol/L (ref 135–145)

## 2018-01-17 LAB — CBC
HCT: 27.1 % — ABNORMAL LOW (ref 39.0–52.0)
Hemoglobin: 8.4 g/dL — ABNORMAL LOW (ref 13.0–17.0)
MCH: 35.3 pg — ABNORMAL HIGH (ref 26.0–34.0)
MCHC: 31 g/dL (ref 30.0–36.0)
MCV: 113.9 fL — ABNORMAL HIGH (ref 78.0–100.0)
PLATELETS: 174 10*3/uL (ref 150–400)
RBC: 2.38 MIL/uL — AB (ref 4.22–5.81)
RDW: 17.6 % — AB (ref 11.5–15.5)
WBC: 3.2 10*3/uL — AB (ref 4.0–10.5)

## 2018-01-17 MED ORDER — GUAIFENESIN ER 600 MG PO TB12: 600.0000 mg | ORAL_TABLET | Freq: Two times a day (BID) | ORAL | 0 refills | Status: DC | PRN

## 2018-01-17 NOTE — Telephone Encounter (Signed)
New Message   Pt's wife is calling to check when her husband will be released so that she can make arrangements to have him picked up. Please call

## 2018-01-17 NOTE — Evaluation (Signed)
Occupational Therapy Evaluation Patient Details Name: [name] MRN: [id number] DOB: [birth year] Today's Date: 01/17/2018    History of Present Illness Pt adm with SOB due to CHF exacerbation after not taking lasix as directed. PMH - afib, ckd, OSA, gout, CAD, pacer   Clinical Impression   PTA, pt was living with his wife and was the primary caregiver for her. Pt's family lives nearby and will assist as needed. Pt currently presenting near baseline functional and performing ADLs and functional mobility at [facility] level. Pt would benefit from further acute OT to facilitate safe dc. Recommend dc to home once medically stable per physician.    Follow Up Recommendations  Supervision - Intermittent(Pt declining HH therapies)    Equipment Recommendations  None recommended by OT(Pt reporting he has all needed DME for him wife)    Recommendations for Other Services PT consult     Precautions / Restrictions Precautions Precautions: Fall Restrictions Weight Bearing Restrictions: No      Mobility Bed Mobility Overal bed mobility: Modified Independent                Transfers Overall transfer level: Needs assistance Equipment used: None Transfers: Sit to/from Stand Sit to Stand: Supervision         General transfer comment: supervision for safety    Balance Overall balance assessment: History of Falls;Needs assistance Sitting-balance support: No upper extremity supported;Feet supported Sitting balance-Leahy Scale: Good     Standing balance support: No upper extremity supported Standing balance-Leahy Scale: Good                             ADL either performed or assessed with clinical judgement   ADL Overall ADL's : Needs assistance/impaired Eating/Feeding: Set up;Sitting   Grooming: Wash/dry hands;Min guard;Standing Grooming Details (indicate cue type and reason): Pt performing hand hygiene at sink with MIn Guard A for safety in  standing. Upper Body Bathing: Set up;Sitting   Lower Body Bathing: Min guard;Sit to/from stand   Upper Body Dressing : Set up;Sitting   Lower Body Dressing: Min guard;Sit to/from stand   Toilet Transfer: Min guard;Ambulation(simulated to recliner) Armed forces technical officer Details (indicate cue type and reason): Min guard for safety         Functional mobility during ADLs: Min guard General ADL Comments: Pt performing ADLs with Min Guard A for safety. Reporting he feels much better. Pt presenting near baseline function     Vision         Perception     Praxis      Pertinent Vitals/Pain Pain Assessment: No/denies pain     Hand Dominance Right   Extremity/Trunk Assessment Upper Extremity Assessment Upper Extremity Assessment: Overall WFL for tasks assessed   Lower Extremity Assessment Lower Extremity Assessment: Overall WFL for tasks assessed   Cervical / Trunk Assessment Cervical / Trunk Assessment: Kyphotic   Communication Communication Communication: No difficulties   Cognition Arousal/Alertness: Awake/alert Behavior During Therapy: WFL for tasks assessed/performed Overall Cognitive Status: Within Functional Limits for tasks assessed                                 General Comments: Pt acknowledges he is hard headed.    General Comments  SpO2 94-95% on RA during activity. Once pt sitting in recliner and resting, SpO2 dropping to 83%. Educated pt on purse lip breathing and placed pt  back on 2L O2; RN notified    Exercises     Shoulder Instructions      Home Living Family/patient expects to be discharged to:: Private residence Living Arrangements: Spouse/significant other(pt is caregiver for diabled wife) Available Help at Discharge: Family;Available PRN/intermittently Type of Home: House Home Access: Ramped entrance     Home Layout: One level     Bathroom Shower/Tub: Occupational psychologist: Handicapped height Bathroom  Accessibility: Yes   Home Equipment: Environmental consultant - 2 wheels;Cane - single point;Electric scooter;Crutches;Tub bench;Bedside commode   Additional Comments: 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 Children/grandchildren live nearby and assist PRN; children help care for pt's wife as well      Prior Functioning/Environment Level of Independence: Independent with assistive device(s)        Comments: Intermittent use of SPC or RW. Pt still drives. Assists wife with ADLs. Pt endorses multiple falls at home        OT Problem List: Decreased activity tolerance;Impaired balance (sitting and/or standing);Decreased safety awareness      OT Treatment/Interventions: Self-care/ADL training;Therapeutic exercise;Energy conservation;DME and/or AE instruction;Therapeutic activities;Patient/family education    OT Goals(Current goals can be found in the care plan section) Acute Rehab OT Goals Patient Stated Goal: return home as quickly as possible OT Goal Formulation: With patient Time For Goal Achievement: 01/31/18 Potential to Achieve Goals: Good  OT Frequency: Min 2X/week   Barriers to D/C:            Co-evaluation              AM-PAC PT "6 Clicks" Daily Activity     Outcome Measure Help from another person eating meals?: None Help from another person taking care of personal grooming?: A Little Help from another person toileting, which includes using toliet, bedpan, or urinal?: A Little Help from another person bathing (including washing, rinsing, drying)?: A Little Help from another person to put on and taking off regular upper body clothing?: None Help from another person to put on and taking off regular lower body clothing?: A Little 6 Click Score: 20   End of Session Equipment Utilized During Treatment: Oxygen Nurse Communication: Mobility status  Activity Tolerance: Patient tolerated treatment well Patient left: in chair;with call bell/phone within reach  OT Visit Diagnosis: Unsteadiness on  feet (R26.81);Other abnormalities of gait and mobility (R26.89);Muscle weakness (generalized) (M62.81)                Time: 2637-8588 OT Time Calculation (min): 12 min Charges:  OT General Charges $OT Visit: 1 Visit OT Evaluation $OT Eval Moderate Complexity: 1 Mod G-Codes:     [place] MSOT, OTR/L Acute Rehab Pager: [phone] Office: [place] 01/17/2018, 1:37 PM

## 2018-01-17 NOTE — Progress Notes (Addendum)
Progress Note  Patient Name: [name] Date of Encounter: 01/17/2018  Primary Cardiologist: [name], MD   Subjective   No chest pain, no SOB, he felt he did well with PT walking in hall today.  Inpatient Medications    Scheduled Meds: . allopurinol  200 mg Oral Daily  . chlorhexidine  15 mL Mouth Rinse BID  . Chlorhexidine Gluconate Cloth  6 each Topical Q0600  . dutasteride  0.5 mg Oral Daily  . escitalopram  5 mg Oral QHS  . ferrous sulfate  325 mg Oral BID WC  . furosemide  60 mg Intravenous BID  . levalbuterol  0.63 mg Nebulization TID  . mouth rinse  15 mL Mouth Rinse q12n4p  . mirtazapine  15 mg Oral QHS  . multivitamin  1 tablet Oral Daily  . mupirocin ointment  1 application Nasal BID  . pantoprazole  40 mg Oral Daily  . rOPINIRole  3 mg Oral QHS  . tamsulosin  0.4 mg Oral QPC breakfast   Continuous Infusions:  PRN Meds: acetaminophen **OR** [DISCONTINUED] acetaminophen, colchicine, guaiFENesin, hydrOXYzine, ondansetron **OR** ondansetron (ZOFRAN) IV, traMADol   Vital Signs    Vitals:   01/16/18 2008 01/16/18 2113 01/17/18 0427 01/17/18 0838  BP:  (!) 98/56 103/62 (!) 109/41  Pulse:  64 67 60  Resp:  18 18 16   Temp:  98.1 F (36.7 C) 98.3 F (36.8 C) 98 F (36.7 C)  TempSrc:  Oral Axillary Oral  SpO2: 100% 98% 100% 100%  Weight:   192 lb 12.8 oz (87.5 kg)   Height:        Intake/Output Summary (Last 24 hours) at 01/17/2018 0921 Last data filed at 01/17/2018 0431 Gross per 24 hour  Intake 1160 ml  Output 2650 ml  Net -1490 ml   Filed Weights   01/15/18 0314 01/16/18 0538 01/17/18 0427  Weight: 191 lb 3.2 oz (86.7 kg) 191 lb (86.6 kg) 192 lb 12.8 oz (87.5 kg)    Telemetry    A fib with v pacing - Personally Reviewed  ECG    No new - Personally Reviewed  Physical Exam   GEN: No acute distress.   Neck: + JVD Cardiac: RRR, no murmurs, rubs, or gallops.  Respiratory: Clear mostly to auscultation bilaterally. Few rales in  bases GI: Soft, nontender, non-distended  MS: No edema; No deformity. Neuro:  Nonfocal  Psych: Normal affect   Labs    Chemistry Recent Labs  Lab 01/13/18 0523 01/14/18 0756 01/17/18 0454  NA 143 145 141  K 3.6 3.7 4.0  CL 106 105 101  CO2 32 32 36*  GLUCOSE 91 98 82  BUN 43* 35* 29*  CREATININE 1.13 1.10 1.03  CALCIUM 8.6* 8.6* 8.5*  PROT 6.7 6.4*  --   ALBUMIN 3.1* 2.9*  --   AST 104* 75*  --   ALT 117* 104*  --   ALKPHOS 91 90  --   BILITOT 0.8 0.8  --   GFRNONAA 58* 60* >60  GFRAA >60 >60 >60  ANIONGAP 5 8 4*     Hematology Recent Labs  Lab 01/13/18 0523 01/14/18 0756 01/17/18 0454  WBC 3.3* 3.6* 3.2*  RBC 2.37* 2.42* 2.38*  HGB 8.4* 8.7* 8.4*  HCT 27.2* 27.8* 27.1*  MCV 114.8* 114.9* 113.9*  MCH 35.4* 36.0* 35.3*  MCHC 30.9 31.3 31.0  RDW 17.7* 17.5* 17.6*  PLT 165 172 174    Cardiac Enzymes Recent Labs  Lab 01/12/18  9767 01/12/18 1540 01/12/18 2340  TROPONINI <0.03 0.03* <0.03    Recent Labs  Lab 01/12/18 0323  TROPIPOC 0.01     BNP Recent Labs  Lab 01/12/18 0306  BNP 1,360.9*     DDimer No results for input(s): DDIMER in the last 168 hours.   Radiology    No results found.  Cardiac Studies   Echo 01/13/18 Study Conclusions  - Left ventricle: The cavity size was normal. Systolic function was   severely reduced. The estimated ejection fraction was in the   range of 25% to 30%. There is akinesis of the   mid-apicalanteroseptal and inferoseptal myocardium. The study is   not technically sufficient to allow evaluation of LV diastolic   function. - Aortic valve: Trileaflet; moderately thickened, moderately   calcified leaflets. Transvalvular velocity was minimally   increased. There was no stenosis. There was mild regurgitation.   Peak velocity (S): 233 cm/s. Valve area (VTI): 1.49 cm^2. Valve   area (Vmax): 1.6 cm^2. Valve area (Vmean): 1.44 cm^2. - Mitral valve: There was mild regurgitation. - Left atrium: The atrium was  severely dilated. - Right ventricle: The cavity size was moderately dilated. Wall   thickness was normal. Pacer wire or catheter noted in right   ventricle. - Right atrium: The atrium was severely dilated. - Tricuspid valve: There was moderate regurgitation. - Pulmonary arteries: Systolic pressure was moderately increased.   PA peak pressure: 53 mm Hg (S).  09/21/2017 Echocardiogram: Study Conclusions - Left ventricle: The cavity size was moderately dilated. There was mild concentric hypertrophy. Systolic function was severely reduced. The estimated ejection fraction was in the range of 25% to 30%. The study is not technically sufficient to allow evaluation of LV diastolic function. - Mitral valve: There was mild regurgitation. - Left atrium: The atrium was moderately dilated. - Right ventricle: The cavity size was moderately dilated. - Right atrium: The atrium was moderately dilated. - Atrial septum: No defect or patent foramen ovale was identified. - Tricuspid valve: There was severe regurgitation. - Pulmonary arteries: PA peak pressure: 111 mm Hg (S).  Notes recorded by [name], MD on 09/22/2017 at 4:42 PM I disagree with the report. I do not think the EF is 25-30%, but rather 40-45%, close to his previous baseline. There is however profound dyssynchrony of contraction due to RV pacing (which is occurring with increasing frequency due to slow heart rates (although none of his meds slow the heart rate). The biggest problem remains severe pulmonary HTN and severe tricuspid insufficiency. These are related to obesity, OSA and probably the pacemaker lead across the tricuspid valve.     Patient Profile     84 y.o. male with a hx of nonischemic cardiomyopathy (EF40-45% previously),right sided heart failure, PAH, OSA, severe tricuspid regurgitation,permanent atrial fibrillation with slow ventricular response, single chamber pacemaker, and HTNadmitted with acute  hypercapnic respiratory failure and right heart failure exacerbation, improved with BiPAP and diuretics.  EF 25%.     Assessment & Plan    Acute on chronic systolic HF  Pt was not taking prescribed dose of lasix --BNP 1360 --presumed non ischemic cardiomyopathy  --neg 8560 since admit and wt down from 199 to 192   --on lasix IV 60 mg BID --as outpt was on 80 mg in AM and 40 in pm  --EP has seen and will see pt as outpt for pacer upgrade to BiV --EF this admit 25-30% similar to prior though Dr. Jerilynn Mages. Croitoru disagreed.  Atrial fib-permanent with slow VR and PPM placed V pacing > 70% with decreased EF possible PPM associated myopathy per Dr. [name], ? Ischemic eval.  (cath 2010 with normal coronary arteries  --was on eliquis but stopped due to falls and GIB.  Transfused in April.  Eliquis resumed 11/10/17   CKD -3/4 cr today 1.03  OSA on CPAP continue   Hypotension 98/56 to 109/41   Anemia with hgb at 8.4 --per Im --transfused in April --on Iron  Acute duodenitis - no GI bleed per IM  desat-  Did well with walking but in bed he desats to 80s, he is awake but sp02 does drop to the 80s.     For questions or updates, please contact Camilla Please consult [email or web address] for contact info under Cardiology/STEMI.      Signed, Cecilie Kicks, NP  01/17/2018, 9:21 AM

## 2018-01-17 NOTE — Progress Notes (Signed)
Pt was having multiple episode of apnea while sleeping. Pt's 02 sat would drop in to the 80's and would comeback up in the 90's when he was arroused. Respiratory therapy called and pt placed back on BiPAP.  Pt's 02 in the high 90's. Will cont to monitor pt.

## 2018-01-17 NOTE — Progress Notes (Signed)
Spoke with pt's wife and updated her on pt's request. Wife stated she has no questions at this time. Will cont to monitor pt.

## 2018-01-17 NOTE — Care Management Important Message (Signed)
Important Message  Patient Details  Name: [name] MRN: [id number] Date of Birth: 1932/07/06   Medicare Important Message Given:  Yes    Erenest Rasher, RN 01/17/2018, 3:31 PM

## 2018-01-17 NOTE — Progress Notes (Signed)
Pt refusing NIV at this time, pt states that he wants to walk the halls.

## 2018-01-17 NOTE — Telephone Encounter (Signed)
Returned call to pt/wife told to ask his nurse in the hospital today and to have her call whoever his doctor is

## 2018-01-17 NOTE — Progress Notes (Signed)
[facility]                                                                              Patient Demographics  [name], is Anthony 84 y.o. male, DOB - [birth year], [id number]:[id number]  Admit date - 01/12/2018   Admitting Physician Anthony Dumas, MD  Outpatient Primary MD for the patient is [name], [name] Him, MD  Outpatient specialists:   LOS - 5  days   Medical records reviewed and are as summarized below:    Chief Complaint  Patient presents with  . Congestive Heart Failure  . Groin Swelling       Brief summary   84yo M w/ Anthony hx of chronic systolic CHF (EF 47-42%), atrial fibrillation on Eliquis, CKD stage III, CAD, obstructive sleep apnea on CPAP, and s/p pacemaker who presented to the ED w/ worsening edema.  Patient had been taking half the dose of his prescribed Lasix to prevent from running out while out of town on vacation. Patient also reported intermittent black tarry stools w/ abdom pain.  Assessment & Plan    Acute on chronic systolic CHF exacerbation, pulmonary hypertension, severe TR -2D echo on 6/7 showed EF of 25 to 30%, significant decline in the LVEF -Cardiology was consulted, placed on IV diuresis -Home O2 evaluation done, still desatting with O2 sat in 80s, per cardiology continue IV diuresis for 1 more day -Per cardiology, will not be able to accommodate CRT upgrade during this hospitalization and will follow outpatient to arrange for the procedure - negative balance of 5.9D  Acute metabolic encephalopathy secondary to hypoxic and hypercarbic respiratory failure, CHF, OSA -Mental status much improved, alert and oriented, appears to be at his baseline  Abdominal pain, acute duodenitis -No evidence of ongoing GI bleeding, was placed on IV Rocephin and Flagyl, completed 5 days, on 6/10  Macrocytic anemia possibly due to anemia of chronic disease versus marrow issue -Baseline hemoglobin 8.6, B12 normal, folate normal -EGD 3/30 had shown  angiodysplasia status post fulguration and hiatal hernia -Outpatient work-up recommended  Chronic kidney disease stage III - Baseline creatinine around 1.4, currently stable  Chronic atrial fibrillation -Heart rate stable, off anticoagulation secondary to frequent falls   Dementia -Stable, mental status appears close to  baseline  Code Status:  FC DVT Prophylaxis:   SCD's Family Communication: Discussed in detail with the patient, all imaging results, lab results explained to the patient    Disposition Plan: possible dc in am   Time Spent in minutes   25 minutes  Procedures:  None   Consultants:   Cardiology   Antimicrobials:      Medications  Scheduled Meds: . allopurinol  200 mg Oral Daily  . chlorhexidine  15 mL Mouth Rinse BID  . Chlorhexidine Gluconate Cloth  6 each Topical Q0600  . dutasteride  0.5 mg Oral Daily  . escitalopram  5 mg Oral QHS  . ferrous sulfate  325 mg Oral BID WC  . furosemide  60 mg Intravenous BID  . levalbuterol  0.63 mg Nebulization TID  . mouth rinse  15 mL Mouth Rinse q12n4p  . mirtazapine  15  mg Oral QHS  . multivitamin  1 tablet Oral Daily  . mupirocin ointment  1 application Nasal BID  . pantoprazole  40 mg Oral Daily  . rOPINIRole  3 mg Oral QHS  . tamsulosin  0.4 mg Oral QPC breakfast   Continuous Infusions: PRN Meds:.acetaminophen **OR** [DISCONTINUED] acetaminophen, colchicine, guaiFENesin, hydrOXYzine, ondansetron **OR** ondansetron (ZOFRAN) IV, traMADol   Antibiotics   Anti-infectives (From admission, onward)   Start     Dose/Rate Route Frequency Ordered Stop   01/12/18 2020  metroNIDAZOLE (FLAGYL) IVPB 500 mg     500 mg 100 mL/hr over 60 Minutes Intravenous Every 8 hours 01/12/18 2020 01/16/18 2225   01/12/18 1030  cefTRIAXone (ROCEPHIN) 1 g in sodium chloride 0.9 % 100 mL IVPB     1 g 200 mL/hr over 30 Minutes Intravenous Every 24 hours 01/12/18 1016 01/16/18 1100   01/12/18 1030  metroNIDAZOLE (FLAGYL) IVPB  500 mg  Status:  Discontinued     500 mg 100 mL/hr over 60 Minutes Intravenous Every 8 hours 01/12/18 1016 01/12/18 2020        Subjective:   [name] was seen and examined today. Feels better today, however has hypoxia and some doe. Patient denies dizziness, chest pain, shortness of breath, abdominal pain, N/V/D/C, new weakness, numbess, tingling. No acute events overnight.    Objective:   Vitals:   01/17/18 1114 01/17/18 1145 01/17/18 1358 01/17/18 1437  BP:  (!) 113/59  117/63  Pulse:  64 61 (!) 43  Resp:  18 17 19   Temp:  98 F (36.7 C)  (!) 97.4 F (36.3 C)  TempSrc:  Oral  Oral  SpO2: 99% 93% 96% 94%  Weight:      Height:        Intake/Output Summary (Last 24 hours) at 01/17/2018 1536 Last data filed at 01/17/2018 1400 Gross per 24 hour  Intake 1640 ml  Output 3150 ml  Net -1510 ml     Wt Readings from Last 3 Encounters:  01/17/18 87.5 kg (192 lb 12.8 oz)  11/14/17 84.4 kg (186 lb)  11/08/17 90.1 kg (198 lb 9.6 oz)     Exam  General: Alert and oriented x 3, NAD  Eyes:   HEENT:   Cardiovascular: S1 S2 auscultated, no rubs, murmurs or gallops. Regular rate and rhythm.  Respiratory:  Dec BS at bases  Gastrointestinal: Soft, nontender, nondistended, + bowel sounds  Ext: no pedal edema bilaterally  Neuro: no FND  Musculoskeletal: No digital cyanosis, clubbing  Skin: No rashes  Psych: Normal affect and demeanor, alert and oriented x3    Data Reviewed:  I have personally reviewed following labs and imaging studies  Micro Results Recent Results (from the past 240 hour(s))  MRSA PCR Screening     Status: Abnormal   Collection Time: 01/12/18  4:56 PM  Result Value Ref Range Status   MRSA by PCR POSITIVE (Anthony) NEGATIVE Final    Comment:        The GeneXpert MRSA Assay (FDA approved for NASAL specimens only), is one component of Anthony comprehensive MRSA colonization surveillance program. It is not intended to diagnose MRSA infection nor to  guide or monitor treatment for MRSA infections. RESULT CALLED TO, READ BACK BY AND VERIFIED WITHMurvin Natal RN [id number] 01/12/18 [name] Performed at [facility], [employer] [address] [place], [place] [address]     Radiology Reports Ct Abdomen Pelvis Wo Contrast  Result Date: 01/10/2018 CLINICAL DATA:  Abdominal  pain x 1-2 weeks, history of colostomy status post reversal EXAM: CT ABDOMEN AND PELVIS WITHOUT CONTRAST TECHNIQUE: Multidetector CT imaging of the abdomen and pelvis was performed following the standard protocol without IV contrast. COMPARISON:  03/01/2008 FINDINGS: Motion degraded images. Lower chest: Small right and trace left pleural effusions. Hepatobiliary: Unenhanced liver is unremarkable. Gallbladder is unremarkable. No intrahepatic or extrahepatic ductal dilatation. Pancreas: Grossly unremarkable. Spleen: Within normal limits. Adrenals/Urinary Tract: Adrenal glands are within normal limits. Bilateral renal cysts, measuring up to 6.2 cm in the posterior interpolar left kidney. No renal calculi or hydronephrosis. Bladder is within normal limits. Stomach/Bowel: Stomach is within normal limits. No evidence of bowel obstruction. However, there is nonspecific stranding in the right upper abdomen which is favored to be localized along the duodenum (coronal image 58), favoring duodenitis over pancreatitis. Appendix is not discretely visualized and is reportedly surgically absent. Status post partial colectomy with suture line in the right mid abdomen (series 2/image 44). Scattered colonic diverticulosis, without evidence of diverticulitis. Vascular/Lymphatic: No evidence of abdominal aortic aneurysm. Atherosclerotic calcifications of the abdominal aorta and branch vessels. No suspicious abdominopelvic lymphadenopathy. Reproductive: Enlargement of the central gland, suggesting BPH. Other: Trace perihepatic ascites. Mild mesenteric fluid/stranding. Trace presacral fluid. Mild body wall edema.  Postsurgical changes along the anterior abdominal wall. Small right paramidline ventral hernia containing Anthony knuckle of small bowel (series 2/image 39). Small right and moderate left fat containing inguinal hernias, with trace fluid on the left. Musculoskeletal: Degenerative changes of the visualized thoracolumbar spine. IMPRESSION: Motion degraded images. Inflammatory changes centered in the right upper quadrant, favoring duodenitis, less likely pancreatitis. No evidence of bowel obstruction. Status post partial colectomy. Prior appendectomy. No peripancreatic fluid collections. Small right and trace left pleural effusions. Trace perihepatic and presacral ascites. Body wall edema. Electronically Signed   By: Julian Hy M.D.   On: 01/10/2018 13:09   Dg Chest 2 View  Result Date: 01/12/2018 CLINICAL DATA:  Shortness of breath. EXAM: CHEST - 2 VIEW COMPARISON:  11/03/2017 FINDINGS: Marked cardiomegaly, similar to prior exam. Single lead pacemaker remains in place. Fine interstitial opacities suggest pulmonary edema. Small bilateral pleural effusions. No focal airspace disease or pneumothorax. Chronic change about shoulders, the bones are under mineralized. IMPRESSION: Cardiomegaly. Increased interstitial opacities suggest pulmonary edema. Electronically Signed   By: Jeb Levering M.D.   On: 01/12/2018 03:26   Ct Head Wo Contrast  Result Date: 01/12/2018 CLINICAL DATA:  Headache EXAM: CT HEAD WITHOUT CONTRAST TECHNIQUE: Contiguous axial images were obtained from the base of the skull through the vertex without intravenous contrast. COMPARISON:  November 03, 2017 and April 05, 2017 FINDINGS: Brain: Mild diffuse atrophy is stable. There is no intracranial mass, hemorrhage, extra-axial fluid collection, or midline shift. There is patchy small vessel disease in the centra semiovale bilaterally. There is Anthony small lacunar infarct in the posterior limb of the left internal capsule, stable. There is also Anthony small  prior lacunar infarct in the anterior limb of the left internal capsule. There is no appreciable new gray-white compartment lesion. No acute infarct is demonstrable. Vascular: There is no appreciable hyperdense vessel. There is calcification in each carotid siphon region. Skull: The bony calvarium Anthony appears intact. Sinuses/Orbits: There is mucosal thickening in several ethmoid air cells. Other paranasal sinuses are clear. There is rightward deviation of the nasal septum. Orbits appear symmetric bilaterally. Other: Mastoid air cells are clear. IMPRESSION: Stable atrophy with supratentorial small vessel disease. No evident acute infarct. No mass or hemorrhage. There  are foci of arterial vascular calcification. There is mucosal thickening in several ethmoid air cells. There is rightward deviation of the nasal septum. Electronically Signed   By: Lowella Grip III M.D.   On: 01/12/2018 08:42    Lab Data:  CBC: Recent Labs  Lab 01/12/18 0306 01/13/18 0523 01/14/18 0756 01/17/18 0454  WBC 4.3 3.3* 3.6* 3.2*  HGB 8.1* 8.4* 8.7* 8.4*  HCT 25.8* 27.2* 27.8* 27.1*  MCV 114.2* 114.8* 114.9* 113.9*  PLT 164 165 172 939   Basic Metabolic Panel: Recent Labs  Lab 01/12/18 0306 01/12/18 0931 01/13/18 0523 01/14/18 0756 01/17/18 0454  NA 142  --  143 145 141  K 4.1  --  3.6 3.7 4.0  CL 107  --  106 105 101  CO2 26  --  32 32 36*  GLUCOSE 103*  --  91 98 82  BUN 55*  --  43* 35* 29*  CREATININE 1.54*  --  1.13 1.10 1.03  CALCIUM 8.7*  --  8.6* 8.6* 8.5*  MG  --  2.2  --   --   --    GFR: Estimated Creatinine Clearance: 56.4 mL/min (by C-G formula based on SCr of 1.03 mg/dL). Liver Function Tests: Recent Labs  Lab 01/13/18 0523 01/14/18 0756  AST 104* 75*  ALT 117* 104*  ALKPHOS 91 90  BILITOT 0.8 0.8  PROT 6.7 6.4*  ALBUMIN 3.1* 2.9*   No results for input(s): LIPASE, AMYLASE in the last 168 hours. Recent Labs  Lab 01/12/18 0931  AMMONIA 41*   Coagulation Profile: No results  for input(s): INR, PROTIME in the last 168 hours. Cardiac Enzymes: Recent Labs  Lab 01/12/18 0931 01/12/18 1540 01/12/18 2340  TROPONINI <0.03 0.03* <0.03   BNP (last 3 results) No results for input(s): PROBNP in the last 8760 hours. HbA1C: No results for input(s): HGBA1C in the last 72 hours. CBG: No results for input(s): GLUCAP in the last 168 hours. Lipid Profile: No results for input(s): CHOL, HDL, LDLCALC, TRIG, CHOLHDL, LDLDIRECT in the last 72 hours. Thyroid Function Tests: No results for input(s): TSH, T4TOTAL, FREET4, T3FREE, THYROIDAB in the last 72 hours. Anemia Panel: No results for input(s): VITAMINB12, FOLATE, FERRITIN, TIBC, IRON, RETICCTPCT in the last 72 hours. Urine analysis:    Component Value Date/Time   COLORURINE YELLOW 01/12/2018 1011   APPEARANCEUR CLEAR 01/12/2018 1011   LABSPEC 1.008 01/12/2018 1011   PHURINE 5.0 01/12/2018 1011   GLUCOSEU NEGATIVE 01/12/2018 1011   HGBUR NEGATIVE 01/12/2018 [place] 01/12/2018 [place] 01/12/2018 1011   PROTEINUR NEGATIVE 01/12/2018 1011   NITRITE NEGATIVE 01/12/2018 [place] 01/12/2018 1011     Philomina Leon M.D. [facility] 01/17/2018, 3:36 PM  Pager: [phone] Between 7am to 7pm - call Pager - [phone]  After 7pm go to [email or web address] - password [email or web address]  Call night coverage person covering after 7pm

## 2018-01-17 NOTE — Progress Notes (Signed)
SATURATION QUALIFICATIONS: (This note is used to comply with regulatory documentation for home oxygen)  Patient Saturations on Room Air at Rest = 83-86%  Patient Saturations on 2 Liters of oxygen while resting = 96%  Please briefly explain why patient needs home oxygen:  Tried to wean pt of oxygen twice. Both times pt's 02 would down in the low 80's, pt would have purse lip breathing. Pt placed on 2l of 02  sats would come up the mid to high 90's.

## 2018-01-17 NOTE — Progress Notes (Signed)
Pt is on NIV QHS at this time tolerating it well.  

## 2018-01-17 NOTE — Progress Notes (Signed)
Pt awake. Cafeteria called for dinner tray. Pt off BiPAP.  02 sat 97% on 2l of 02. Will cont to monitor pt.

## 2018-01-17 NOTE — Evaluation (Addendum)
Physical Therapy Evaluation Patient Details Name: [name] MRN: [id number] DOB: 1932-03-04 Today's Date: 01/17/2018   History of Present Illness  Pt adm with SOB due to CHF exacerbation after not taking lasix as directed. PMH - afib, ckd, OSA, gout, CAD, pacer  Clinical Impression  Pt presents to PT with history of multiple falls at home. Pt mobilizing relatively well with rollator in hallway but has baseline balance and safety issues. Pt acknowledges his fall risk and reports he is hard headed and does not want post acute rehab including [facility]. Pt reports he feels his falls at home occur when he falls asleep standing up. Pt reports he does too much at home and sometimes stays up all night working on things. 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 Encouraged pt to take care of himself so that he can continue to take care of his wife. OF Note: Pt's SpO2 was 95% on RA with amb.    Follow Up Recommendations No PT follow up(pt declines any rehab or HH)    Equipment Recommendations  None recommended by PT    Recommendations for Other Services       Precautions / Restrictions Precautions Precautions: Fall      Mobility  Bed Mobility Overal bed mobility: Modified Independent                Transfers Overall transfer level: Modified independent Equipment used: None                Ambulation/Gait Ambulation/Gait assistance: Supervision Ambulation Distance (Feet): 475 Feet Assistive device: Rolling walker (2 wheeled);4-wheeled walker Gait Pattern/deviations: Step-through pattern;Trunk flexed   Gait velocity interpretation: >4.37 ft/sec, indicative of normal walking speed General Gait Details: Supervision for safety. Pt tends to move quickly and verbal cues to slow down and take his time.  Stairs            Wheelchair Mobility    Modified Rankin (Stroke Patients Only)       Balance Overall balance assessment: History of Falls;Needs assistance Sitting-balance support: No upper extremity supported;Feet supported Sitting balance-Leahy Scale: Good     Standing balance support: No upper extremity supported Standing balance-Leahy Scale: Good                               Pertinent Vitals/Pain Pain Assessment: No/denies pain    Home Living Family/patient expects to be discharged to:: Private residence Living Arrangements: Spouse/significant other(pt is caregiver for diabled wife) Available Help at Discharge: Family;Available PRN/intermittently Type of Home: House Home Access: Ramped entrance     Home Layout: One level Home Equipment: Walker - 2 wheels;Cane - single point;Electric scooter;Crutches;Tub bench Additional Comments: 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 Children/grandchildren live nearby and assist PRN; children help care for pt's wife as well    Prior Function Level of Independence: Independent with assistive device(s)         Comments: Intermittent use of SPC or RW. Pt still drives. Assists wife with ADLs. Pt endorses multiple falls at home     Hand Dominance        Extremity/Trunk Assessment   Upper Extremity Assessment Upper Extremity Assessment: Overall WFL for tasks assessed    Lower Extremity Assessment Lower Extremity Assessment: Overall WFL for tasks assessed       Communication   Communication: No difficulties  Cognition  Arousal/Alertness: Awake/alert Behavior During Therapy: WFL for tasks assessed/performed Overall Cognitive Status: Within Functional Limits for tasks assessed                                 General Comments: Pt acknowledges he is hard headed.       General Comments      Exercises     Assessment/Plan    PT Assessment Patient needs continued PT services  PT Problem List Decreased mobility;Decreased balance;Decreased safety awareness       PT Treatment  Interventions DME instruction;Therapeutic activities;Gait training;Therapeutic exercise;Patient/family education;Balance training;Functional mobility training    PT Goals (Current goals can be found in the Care Plan section)  Acute Rehab PT Goals Patient Stated Goal: return home as quickly as possible PT Goal Formulation: With patient Time For Goal Achievement: 01/24/18 Potential to Achieve Goals: Good    Frequency Min 3X/week   Barriers to discharge Decreased caregiver support      Co-evaluation               AM-PAC PT "6 Clicks" Daily Activity  Outcome Measure Difficulty turning over in bed (including adjusting bedclothes, sheets and blankets)?: None Difficulty moving from lying on back to sitting on the side of the bed? : None Difficulty sitting down on and standing up from a chair with arms (e.g., wheelchair, bedside commode, etc,.)?: A Little Help needed moving to and from a bed to chair (including a wheelchair)?: None Help needed walking in hospital room?: A Little Help needed climbing 3-5 steps with a railing? : A Little 6 Click Score: 21    End of Session Equipment Utilized During Treatment: Gait belt Activity Tolerance: Patient tolerated treatment well Patient left: in bed;with bed alarm set Nurse Communication: Mobility status(nursing student) PT Visit Diagnosis: Unsteadiness on feet (R26.81);History of falling (Z91.81)    Time: 7782-4235 PT Time Calculation (min) (ACUTE ONLY): 22 min   Charges:   PT Evaluation $PT Eval Moderate Complexity: 1 Mod     PT G CodesMarland Kitchen        [facility] PT [place] 01/17/2018, 10:01 AM

## 2018-01-18 ENCOUNTER — Telehealth: Payer: Self-pay | Admitting: Cardiovascular Disease

## 2018-01-18 ENCOUNTER — Encounter: Payer: Self-pay | Admitting: Cardiology

## 2018-01-18 DIAGNOSIS — N171 Acute kidney failure with acute cortical necrosis: Secondary | ICD-10-CM

## 2018-01-18 MED ORDER — FUROSEMIDE 80 MG PO TABS: 80.0000 mg | ORAL_TABLET | Freq: Two times a day (BID) | ORAL | Status: DC

## 2018-01-18 MED ORDER — FUROSEMIDE 80 MG PO TABS: 80.0000 mg | ORAL_TABLET | Freq: Two times a day (BID) | ORAL | 0 refills | Status: DC

## 2018-01-18 MED ORDER — LEVALBUTEROL HCL 0.63 MG/3ML IN NEBU: 0.6300 mg | INHALATION_SOLUTION | Freq: Four times a day (QID) | RESPIRATORY_TRACT | Status: DC | PRN

## 2018-01-18 NOTE — Care Management Note (Addendum)
Case Management Note  Patient Details  Name: [name] MRN: [id number] Date of Birth: [birth year]  Subjective/Objective:   Dyspnea on exertion, persistent afib, CHF, acute metabolic encephalopathy            Action/Plan: NCM spoke to pt and offered choice for HH/list provided. Pt agreeable to [facility] to come out. Contacted [facility] for [facility] RN. Contacted [facility] for oxygen for home. Oxygen delivered to room prior to dc. Has CPAP home, motorized wheelchair, cane and walker. Does not have neb machine.    Expected Discharge Date:  01/18/18               Expected Discharge Plan:  [place]  In-House Referral:  NA  Discharge planning Services  CM Consult  Post Acute Care Choice:  Home Health Choice offered to:  Patient  DME Arranged:  Oxygen DME Agency:  [place]:  RN [facility] Agency:  [place]  Status of Service:  Completed, signed off  If discussed at [place] of Stay Meetings, dates discussed:    Additional Comments:  Erenest Rasher, RN 01/18/2018, 4:30 PM

## 2018-01-18 NOTE — Telephone Encounter (Signed)
New Message   [facility] appointment made on 01/25/18 at 11:00 with Jory Sims per Mickel Baas

## 2018-01-18 NOTE — Discharge Summary (Signed)
Physician Discharge Summary  [name] CVE:[id number] DOB: February 21, 1932 DOA: 01/12/2018  PCP: Anthony Pao, MD  Admit date: 01/12/2018 Discharge date: 01/18/2018  Admitted From: Home Disposition: Home   Recommendations for Outpatient Follow-up:  1. Follow up with cardiology as scheduled, will need EP follow up for PPM upgrade to BiV. 2. Follow up with PCP in 1-2 weeks. 3. Please monitor BMP and CBC.  4. Consider further GI work up for anemia.  Home Health: RN (others declined by patient) Equipment/Devices: Continuous oxygen, otherwise none new (has all he needs at home) Discharge Condition: Stable CODE STATUS: Full Diet recommendation: Heart healthy  Brief/Interim Summary: [name] is an 84yo M w/ a hx of chronic systolic CHF, atrial fibrillation with SVR s/p pacemaker on Eliquis, CKD stage III, CAD, obstructive sleep apnea on CPAP who presented to the ED w/ worsening edema. Patient had been taking half the dose of his prescribed Lasix to prevent from running out while out of town on vacation. Patient also reported intermittent black tarry stools w/ abdominal pain, though this resolved with 5 days of antibiotics with stable hgb. He diuresed well per cardiology recommendations and is stable for discharge. See below for further details.  Discharge Diagnoses:  Active Problems:   Dyspnea on exertion   Atrial fibrillation, persistent, slow VR   Chronic anticoagulation   Nonischemic cardiomyopathy - coronaries by angiography 2010   OSA on CPAP   Chronic systolic heart failure (HCC)   Chronic atrial fibrillation (HCC)   CKD (chronic kidney disease), stage III (HCC)   Acute respiratory failure with hypoxia (HCC)   Acute renal failure superimposed on stage 3 chronic kidney disease (HCC)   CHF exacerbation (HCC)  Acute on chronic systolic CHF exacerbation, pulmonary hypertension, severe TR: 2D echo on 6/7 showed EF of 25 to 30%, significant decline in the LVEF. Presumed NICM.   - Improved with IV diuresis per cardiology. DC on lasix 80mg  po BID, weight is 196lbs.  - Has f/u w/Dr. C scheduled. Will need to follow up with EP for pacer upgrade to BiV.  Permanent AFib with SVR s/p PPM: Frequently paced.  - Follow up with EP (seen in hospital) for PPM upgrade.  - Continue home medications.  - Eliquis stopped previously due to high fall risk and h/o GI bleeding.  Acute metabolic encephalopathy secondary to hypoxic and hypercarbic respiratory failure, CHF, OSA: Resolved.  Abdominal pain, acute duodenitis: No evidence of ongoing GI bleeding, was placed on IV Rocephin and Flagyl, completed 5 days, on 6/10.  Macrocytic anemia possibly due to anemia of chronic disease versus marrow issue: Baseline hemoglobin 8.6, B12 normal, folate normal. EGD 3/30 had shown angiodysplasia status post fulguration and hiatal hernia - Outpatient work-up recommended  Chronic kidney disease stage III: Baseline creatinine around 1.4, currently stable.  Dementia: Mentating at baseline.  OSA: Nocturnal desaturations noted while admitted.  - Continue nocturnal O2 and CPAP.  Discharge Instructions Discharge Instructions    Diet - low sodium heart healthy   Complete by:  As directed    Discharge instructions   Complete by:  As directed    You were admitted for acute on chronic heart failure which has improved with IV lasix. You will need to follow up with your cardiologist, Dr. Loletha [name] in as scheduled soon, and take lasix 80mg  twice daily until that time. If your symptoms return, seek medical attention sooner.   It appears that you need oxygen which is arranged for you at home.  Increase activity slowly   Complete by:  As directed      Allergies as of 01/18/2018      Reactions   Vicodin [hydrocodone-acetaminophen] Itching, Nausea Only      Medication List    STOP taking these medications   apixaban 5 MG Tabs tablet Commonly known as:  ELIQUIS   STOOL SOFTENER PO     TAKE these  medications   acetaminophen 650 MG CR tablet Commonly known as:  TYLENOL Take 650 mg by mouth every 8 (eight) hours as needed for pain.   allopurinol 100 MG tablet Commonly known as:  ZYLOPRIM Take 2 tablets (200 mg total) by mouth daily.   colchicine 0.6 MG tablet Take 0.6 mg by mouth daily as needed (gout flare). Colcrys   dutasteride 0.5 MG capsule Commonly known as:  AVODART Take 0.5 mg by mouth daily.   escitalopram 5 MG tablet Commonly known as:  LEXAPRO Take 1 tablet (5 mg total) by mouth at bedtime.   feeding supplement (PRO-STAT SUGAR FREE 64) Liqd Take 30 mLs by mouth 2 (two) times daily.   ferrous sulfate 325 (65 FE) MG tablet Take 1 tablet (325 mg total) by mouth 2 (two) times daily with a meal.   furosemide 80 MG tablet Commonly known as:  LASIX Take 1 tablet (80 mg total) by mouth 2 (two) times daily. What changed:    medication strength  how much to take  additional instructions   guaiFENesin 600 MG 12 hr tablet Commonly known as:  MUCINEX Take 1 tablet (600 mg total) by mouth 2 (two) times daily as needed for cough.   hydrOXYzine 25 MG tablet Commonly known as:  ATARAX/VISTARIL TAKE 1 TABLET BY MOUTH EVERY 6 HOURS AS NEEDED FOR  ITCHING   mirtazapine 15 MG tablet Commonly known as:  REMERON Take 15 mg by mouth at bedtime.   multivitamin with minerals Tabs tablet Take 1 tablet by mouth daily.   pantoprazole 40 MG tablet Commonly known as:  PROTONIX Take 1 tablet (40 mg total) by mouth daily. What changed:  when to take this   potassium chloride 10 MEQ tablet Commonly known as:  K-DUR Take 1 tablet (10 mEq total) by mouth daily.   PRESERVISION AREDS 2 Caps Take 1 capsule by mouth daily.   rOPINIRole 3 MG tablet Commonly known as:  REQUIP Take 3 mg by mouth at bedtime.   tamsulosin 0.4 MG Caps capsule Commonly known as:  FLOMAX Take 0.4 mg by mouth daily after breakfast.   traMADol 50 MG tablet Commonly known as:  ULTRAM Take 100  mg by mouth 2 (two) times daily. For pain            Durable Medical Equipment  (From admission, onward)        Start     Ordered   01/17/18 1536  For home use only DME oxygen  Once    Question Answer Comment  Mode or (Route) Nasal cannula   Liters per Minute 2   Frequency Continuous (stationary and portable oxygen unit needed)   Oxygen conserving device Yes   Oxygen delivery system Gas      01/17/18 1536     Follow-up Information    Croitoru, Mihai, MD Follow up on 01/25/2018.   Specialty:  Cardiology Why:  at 11:00 AM with his NP Dr. Jory Sims Contact information: [address] [place] [address] [phone]        [name], [name] Him,  MD. Schedule an appointment as soon as possible for a visit.   Specialty:  Internal Medicine         Allergies  Allergen Reactions  . Vicodin [Hydrocodone-Acetaminophen] Itching and Nausea Only    Consultations:  Cardiology, EP  Procedures/Studies: Ct Abdomen Pelvis Wo Contrast  Result Date: 01/10/2018 CLINICAL DATA:  Abdominal pain x 1-2 weeks, history of colostomy status post reversal EXAM: CT ABDOMEN AND PELVIS WITHOUT CONTRAST TECHNIQUE: Multidetector CT imaging of the abdomen and pelvis was performed following the standard protocol without IV contrast. COMPARISON:  03/01/2008 FINDINGS: Motion degraded images. Lower chest: Small right and trace left pleural effusions. Hepatobiliary: Unenhanced liver is unremarkable. Gallbladder is unremarkable. No intrahepatic or extrahepatic ductal dilatation. Pancreas: Grossly unremarkable. Spleen: Within normal limits. Adrenals/Urinary Tract: Adrenal glands are within normal limits. Bilateral renal cysts, measuring up to 6.2 cm in the posterior interpolar left kidney. No renal calculi or hydronephrosis. Bladder is within normal limits. Stomach/Bowel: Stomach is within normal limits. No evidence of bowel obstruction. However, there is nonspecific stranding in the  right upper abdomen which is favored to be localized along the duodenum (coronal image 58), favoring duodenitis over pancreatitis. Appendix is not discretely visualized and is reportedly surgically absent. Status post partial colectomy with suture line in the right mid abdomen (series 2/image 44). Scattered colonic diverticulosis, without evidence of diverticulitis. Vascular/Lymphatic: No evidence of abdominal aortic aneurysm. Atherosclerotic calcifications of the abdominal aorta and branch vessels. No suspicious abdominopelvic lymphadenopathy. Reproductive: Enlargement of the central gland, suggesting BPH. Other: Trace perihepatic ascites. Mild mesenteric fluid/stranding. Trace presacral fluid. Mild body wall edema. Postsurgical changes along the anterior abdominal wall. Small right paramidline ventral hernia containing a knuckle of small bowel (series 2/image 39). Small right and moderate left fat containing inguinal hernias, with trace fluid on the left. Musculoskeletal: Degenerative changes of the visualized thoracolumbar spine. IMPRESSION: Motion degraded images. Inflammatory changes centered in the right upper quadrant, favoring duodenitis, less likely pancreatitis. No evidence of bowel obstruction. Status post partial colectomy. Prior appendectomy. No peripancreatic fluid collections. Small right and trace left pleural effusions. Trace perihepatic and presacral ascites. Body wall edema. Electronically Signed   By: Julian Hy M.D.   On: 01/10/2018 13:09   Dg Chest 2 View  Result Date: 01/12/2018 CLINICAL DATA:  Shortness of breath. EXAM: CHEST - 2 VIEW COMPARISON:  11/03/2017 FINDINGS: Marked cardiomegaly, similar to prior exam. Single lead pacemaker remains in place. Fine interstitial opacities suggest pulmonary edema. Small bilateral pleural effusions. No focal airspace disease or pneumothorax. Chronic change about shoulders, the bones are under mineralized. IMPRESSION: Cardiomegaly. Increased  interstitial opacities suggest pulmonary edema. Electronically Signed   By: Jeb Levering M.D.   On: 01/12/2018 03:26   Ct Head Wo Contrast  Result Date: 01/12/2018 CLINICAL DATA:  Headache EXAM: CT HEAD WITHOUT CONTRAST TECHNIQUE: Contiguous axial images were obtained from the base of the skull through the vertex without intravenous contrast. COMPARISON:  November 03, 2017 and April 05, 2017 FINDINGS: Brain: Mild diffuse atrophy is stable. There is no intracranial mass, hemorrhage, extra-axial fluid collection, or midline shift. There is patchy small vessel disease in the centra semiovale bilaterally. There is a small lacunar infarct in the posterior limb of the left internal capsule, stable. There is also a small prior lacunar infarct in the anterior limb of the left internal capsule. There is no appreciable new gray-white compartment lesion. No acute infarct is demonstrable. Vascular: There is no appreciable hyperdense vessel. There is calcification in each carotid  siphon region. Skull: The bony calvarium a appears intact. Sinuses/Orbits: There is mucosal thickening in several ethmoid air cells. Other paranasal sinuses are clear. There is rightward deviation of the nasal septum. Orbits appear symmetric bilaterally. Other: Mastoid air cells are clear. IMPRESSION: Stable atrophy with supratentorial small vessel disease. No evident acute infarct. No mass or hemorrhage. There are foci of arterial vascular calcification. There is mucosal thickening in several ethmoid air cells. There is rightward deviation of the nasal septum. Electronically Signed   By: Lowella Grip III M.D.   On: 01/12/2018 08:42    Echo 01/13/18 Study Conclusions  - Left ventricle: The cavity size was normal. Systolic function was severely reduced. The estimated ejection fraction was in the range of 25% to 30%. There is akinesis of the mid-apicalanteroseptal and inferoseptal myocardium. The study is not technically  sufficient to allow evaluation of LV diastolic function. - Aortic valve: Trileaflet; moderately thickened, moderately calcified leaflets. Transvalvular velocity was minimally increased. There was no stenosis. There was mild regurgitation. Peak velocity (S): 233 cm/s. Valve area (VTI): 1.49 cm^2. Valve area (Vmax): 1.6 cm^2. Valve area (Vmean): 1.44 cm^2. - Mitral valve: There was mild regurgitation. - Left atrium: The atrium was severely dilated. - Right ventricle: The cavity size was moderately dilated. Wall thickness was normal. Pacer wire or catheter noted in right ventricle. - Right atrium: The atrium was severely dilated. - Tricuspid valve: There was moderate regurgitation. - Pulmonary arteries: Systolic pressure was moderately increased. PA peak pressure: 53 mm Hg (S).  09/21/2017 Echocardiogram: Study Conclusions - Left ventricle: The cavity size was moderately dilated. There was mild concentric hypertrophy. Systolic function was severely reduced. The estimated ejection fraction was in the range of 25% to 30%. The study is not technically sufficient to allow evaluation of LV diastolic function. - Mitral valve: There was mild regurgitation. - Left atrium: The atrium was moderately dilated. - Right ventricle: The cavity size was moderately dilated. - Right atrium: The atrium was moderately dilated. - Atrial septum: No defect or patent foramen ovale was identified. - Tricuspid valve: There was severe regurgitation. - Pulmonary arteries: PA peak pressure: 111 mm Hg (S).  Notes recorded by [name], MD on 09/22/2017 at 4:42 PM I disagree with the report. I do not think the EF is 25-30%, but rather 40-45%, close to his previous baseline. There is however profound dyssynchrony of contraction due to RV pacing (which is occurring with increasing frequency due to slow heart rates (although none of his meds slow the heart rate). The biggest problem  remains severe pulmonary HTN and severe tricuspid insufficiency. These are related to obesity, OSA and probably the pacemaker lead across the tricuspid valve.  Subjective: Wants to go home. Dyspnea significantly improved from earlier in admission. Denies chest pain, palpitations, bleeding.  Discharge Exam: Vitals:   01/18/18 0549 01/18/18 0812  BP: (!) 112/56   Pulse: 60 60  Resp: 14 20  Temp: 98.1 F (36.7 C)   SpO2: 98% 97%   General: Pt is alert, awake, not in acute distress Cardiovascular: RRR, S1/S2 +, no rubs, no gallops Respiratory: Nonlabored, no crackles. Abdominal: Soft, NT, ND, bowel sounds + Extremities: No edema, no cyanosis  Labs: BNP (last 3 results) Recent Labs    09/30/17 1542 11/03/17 1344 01/12/18 0306  BNP 842.8* 1,728.6* 6,010.9*   Basic Metabolic Panel: Recent Labs  Lab 01/12/18 0306 01/12/18 0931 01/13/18 0523 01/14/18 0756 01/17/18 0454  NA 142  --  143 145 141  K  4.1  --  3.6 3.7 4.0  CL 107  --  106 105 101  CO2 26  --  32 32 36*  GLUCOSE 103*  --  91 98 82  BUN 55*  --  43* 35* 29*  CREATININE 1.54*  --  1.13 1.10 1.03  CALCIUM 8.7*  --  8.6* 8.6* 8.5*  MG  --  2.2  --   --   --    Liver Function Tests: Recent Labs  Lab 01/13/18 0523 01/14/18 0756  AST 104* 75*  ALT 117* 104*  ALKPHOS 91 90  BILITOT 0.8 0.8  PROT 6.7 6.4*  ALBUMIN 3.1* 2.9*   No results for input(s): LIPASE, AMYLASE in the last 168 hours. Recent Labs  Lab 01/12/18 0931  AMMONIA 41*   CBC: Recent Labs  Lab 01/12/18 0306 01/13/18 0523 01/14/18 0756 01/17/18 0454  WBC 4.3 3.3* 3.6* 3.2*  HGB 8.1* 8.4* 8.7* 8.4*  HCT 25.8* 27.2* 27.8* 27.1*  MCV 114.2* 114.8* 114.9* 113.9*  PLT 164 165 172 174   Cardiac Enzymes: Recent Labs  Lab 01/12/18 0931 01/12/18 1540 01/12/18 2340  TROPONINI <0.03 0.03* <0.03   BNP: Invalid input(s): POCBNP CBG: No results for input(s): GLUCAP in the last 168 hours. D-Dimer No results for input(s): DDIMER in the  last 72 hours. Hgb A1c No results for input(s): HGBA1C in the last 72 hours. Lipid Profile No results for input(s): CHOL, HDL, LDLCALC, TRIG, CHOLHDL, LDLDIRECT in the last 72 hours. Thyroid function studies No results for input(s): TSH, T4TOTAL, T3FREE, THYROIDAB in the last 72 hours.  Invalid input(s): FREET3 Anemia work up No results for input(s): VITAMINB12, FOLATE, FERRITIN, TIBC, IRON, RETICCTPCT in the last 72 hours. Urinalysis    Component Value Date/Time   COLORURINE YELLOW 01/12/2018 1011   APPEARANCEUR CLEAR 01/12/2018 1011   LABSPEC 1.008 01/12/2018 1011   PHURINE 5.0 01/12/2018 1011   GLUCOSEU NEGATIVE 01/12/2018 1011   HGBUR NEGATIVE 01/12/2018 [place] 01/12/2018 [place] 01/12/2018 [place] 01/12/2018 1011   NITRITE NEGATIVE 01/12/2018 [place] 01/12/2018 1011    Microbiology Recent Results (from the past 240 hour(s))  MRSA PCR Screening     Status: Abnormal   Collection Time: 01/12/18  4:56 PM  Result Value Ref Range Status   MRSA by PCR POSITIVE (A) NEGATIVE Final    Comment:        The GeneXpert MRSA Assay (FDA approved for NASAL specimens only), is one component of a comprehensive MRSA colonization surveillance program. It is not intended to diagnose MRSA infection nor to guide or monitor treatment for MRSA infections. RESULT CALLED TO, READ BACK BY AND VERIFIED WITHMurvin Natal RN [phone] 01/12/18 A BROWNING Performed at [facility], [place] [address], [place], [place] [address]     Time coordinating discharge: Approximately 40 minutes  Patrecia Pour, MD  [facility] 01/18/2018, 2:11 PM Pager [phone]

## 2018-01-18 NOTE — Telephone Encounter (Signed)
The pt is following up in our [place] office. Will forward this TCM call to that office. 

## 2018-01-18 NOTE — Progress Notes (Signed)
The patient has been given discharge instructions along with a new medication list and what to take today. He is discharging with his daughter via car. The patient is going home on oxygen and has [facility] tank at bedside.  Saddie Benders RN

## 2018-01-18 NOTE — Progress Notes (Addendum)
Progress Note  Patient Name: [name] Date of Encounter: 01/18/2018  Primary Cardiologist: [name], MD   Subjective   No chest pain or SOB, would like to go home  Inpatient Medications    Scheduled Meds: . allopurinol  200 mg Oral Daily  . chlorhexidine  15 mL Mouth Rinse BID  . dutasteride  0.5 mg Oral Daily  . escitalopram  5 mg Oral QHS  . ferrous sulfate  325 mg Oral BID WC  . furosemide  60 mg Intravenous BID  . levalbuterol  0.63 mg Nebulization TID  . mouth rinse  15 mL Mouth Rinse q12n4p  . mirtazapine  15 mg Oral QHS  . multivitamin  1 tablet Oral Daily  . pantoprazole  40 mg Oral Daily  . rOPINIRole  3 mg Oral QHS  . tamsulosin  0.4 mg Oral QPC breakfast   Continuous Infusions:  PRN Meds: acetaminophen **OR** [DISCONTINUED] acetaminophen, colchicine, guaiFENesin, hydrOXYzine, ondansetron **OR** ondansetron (ZOFRAN) IV, traMADol   Vital Signs    Vitals:   01/17/18 2330 01/17/18 2331 01/18/18 0549 01/18/18 0812  BP:   (!) 112/56   Pulse: 60 (!) 59 60 60  Resp: (!) 23 15 14 20   Temp:   98.1 F (36.7 C)   TempSrc:   Oral   SpO2: 97% 98% 98% 97%  Weight:   196 lb 3.2 oz (89 kg)   Height:        Intake/Output Summary (Last 24 hours) at 01/18/2018 0905 Last data filed at 01/18/2018 0600 Gross per 24 hour  Intake 1200 ml  Output 1576 ml  Net -376 ml   Filed Weights   01/16/18 0538 01/17/18 0427 01/18/18 0549  Weight: 191 lb (86.6 kg) 192 lb 12.8 oz (87.5 kg) 196 lb 3.2 oz (89 kg)    Telemetry    A fib with V pacing - Personally Reviewed  ECG    No new - Personally Reviewed  Physical Exam   GEN: No acute distress.   Neck: No JVD Cardiac: RRR, no murmurs, rubs, or gallops.  Respiratory: bilateral breath sounds to auscultation bilaterally few rhonchi . GI: Soft, nontender, non-distended  MS: No edema; No deformity. Neuro:  Nonfocal  Psych: Normal affect   Labs    Chemistry Recent Labs  Lab 01/13/18 0523 01/14/18 0756  01/17/18 0454  NA 143 145 141  K 3.6 3.7 4.0  CL 106 105 101  CO2 32 32 36*  GLUCOSE 91 98 82  BUN 43* 35* 29*  CREATININE 1.13 1.10 1.03  CALCIUM 8.6* 8.6* 8.5*  PROT 6.7 6.4*  --   ALBUMIN 3.1* 2.9*  --   AST 104* 75*  --   ALT 117* 104*  --   ALKPHOS 91 90  --   BILITOT 0.8 0.8  --   GFRNONAA 58* 60* >60  GFRAA >60 >60 >60  ANIONGAP 5 8 4*     Hematology Recent Labs  Lab 01/13/18 0523 01/14/18 0756 01/17/18 0454  WBC 3.3* 3.6* 3.2*  RBC 2.37* 2.42* 2.38*  HGB 8.4* 8.7* 8.4*  HCT 27.2* 27.8* 27.1*  MCV 114.8* 114.9* 113.9*  MCH 35.4* 36.0* 35.3*  MCHC 30.9 31.3 31.0  RDW 17.7* 17.5* 17.6*  PLT 165 172 174    Cardiac Enzymes Recent Labs  Lab 01/12/18 0931 01/12/18 1540 01/12/18 2340  TROPONINI <0.03 0.03* <0.03    Recent Labs  Lab 01/12/18 0323  TROPIPOC 0.01     BNP Recent Labs  Lab 01/12/18 0306  BNP 1,360.9*     DDimer No results for input(s): DDIMER in the last 168 hours.   Radiology    No results found.  Cardiac Studies   Echo 01/13/18 Study Conclusions  - Left ventricle: The cavity size was normal. Systolic function was severely reduced. The estimated ejection fraction was in the range of 25% to 30%. There is akinesis of the mid-apicalanteroseptal and inferoseptal myocardium. The study is not technically sufficient to allow evaluation of LV diastolic function. - Aortic valve: Trileaflet; moderately thickened, moderately calcified leaflets. Transvalvular velocity was minimally increased. There was no stenosis. There was mild regurgitation. Peak velocity (S): 233 cm/s. Valve area (VTI): 1.49 cm^2. Valve area (Vmax): 1.6 cm^2. Valve area (Vmean): 1.44 cm^2. - Mitral valve: There was mild regurgitation. - Left atrium: The atrium was severely dilated. - Right ventricle: The cavity size was moderately dilated. Wall thickness was normal. Pacer wire or catheter noted in right ventricle. - Right atrium: The  atrium was severely dilated. - Tricuspid valve: There was moderate regurgitation. - Pulmonary arteries: Systolic pressure was moderately increased. PA peak pressure: 53 mm Hg (S).  09/21/2017 Echocardiogram: Study Conclusions - Left ventricle: The cavity size was moderately dilated. There was mild concentric hypertrophy. Systolic function was severely reduced. The estimated ejection fraction was in the range of 25% to 30%. The study is not technically sufficient to allow evaluation of LV diastolic function. - Mitral valve: There was mild regurgitation. - Left atrium: The atrium was moderately dilated. - Right ventricle: The cavity size was moderately dilated. - Right atrium: The atrium was moderately dilated. - Atrial septum: No defect or patent foramen ovale was identified. - Tricuspid valve: There was severe regurgitation. - Pulmonary arteries: PA peak pressure: 111 mm Hg (S).  Notes recorded by [name], MD on 09/22/2017 at 4:42 PM I disagree with the report. I do not think the EF is 25-30%, but rather 40-45%, close to his previous baseline. There is however profound dyssynchrony of contraction due to RV pacing (which is occurring with increasing frequency due to slow heart rates (although none of his meds slow the heart rate). The biggest problem remains severe pulmonary HTN and severe tricuspid insufficiency. These are related to obesity, OSA and probably the pacemaker lead across the tricuspid valve.   Patient Profile     84 y.o. male with a hx of nonischemic cardiomyopathy (EF40-45% previously),right sided heart failure, PAH, OSA, severe tricuspid regurgitation,permanent atrial fibrillation with slow ventricular response, single chamber pacemaker, and HTNadmitted with acute hypercapnic respiratory failure and right heart failure exacerbation, improved with BiPAP and diuretics.  EF 25%.    Assessment & Plan    Acute on chronic systolic HF  Pt was not  taking prescribed dose of lasix --BNP 1360 --presumed non ischemic cardiomyopathy  --neg 8936 since admit and wt down from 199 to 192 but today at 196 lb  --on lasix IV 60 mg BID--change to po and discharge defer to Dr. Debara Pickett --as outpt was on 80 mg in AM and 40 in pm  --EP has seen and will see pt as outpt for pacer upgrade to BiV --EF this admit 25-30% similar to prior though Dr. Jerilynn Mages. Croitoru disagreed.    Follow up in 5-7 days in Dr. Jerilynn Mages. Croitoru office    desat-  Did well with walking but in bed he desats to 80s, he is awake but sp02 does drop to the 80s.  He was placed back on his CPAP at  1730 due to several episodes of apnea.  May benefit from home oxygen   Atrial fib-permanent with slow VR and PPM placed V pacing > 70% with decreased EF possible PPM associated myopathy per Dr. [name], ? Ischemic eval.  (cath 2010 with normal coronary arteries  --was on eliquis but stopped due to falls and GIB.  Transfused in April.  Eliquis resumed 11/10/17   CKD -3/4 cr yesterday 1.03  No labs today  OSA on CPAP continue   Hypotension 112/56   Anemia with hgb at 8.4 --per Im --transfused in April --on Iron  Acute duodenitis - no GI bleed per IM     For questions or updates, please contact [facility] Please consult [email or web address] for contact info under Cardiology/STEMI.      Signed, Cecilie Kicks, NP  01/18/2018, 9:05 AM

## 2018-01-18 NOTE — Telephone Encounter (Signed)
Pt currently admitted.

## 2018-01-18 NOTE — Progress Notes (Signed)
Occupational Therapy Treatment Patient Details Name: [name] MRN: [id number] DOB: 1932-04-04 Today's Date: 01/18/2018    History of present illness Pt adm with SOB due to CHF exacerbation after not taking lasix as directed. PMH - afib, ckd, OSA, gout, CAD, pacer   OT comments  Pt reports that an oxygen concentrator or use of a walker does not work in his home due to clutter. Educated in fall prevention and energy conservation. Pt reports fatigue from caring for his wife, but does not see any alternatives. Educated pt not to continue burning his garbage for his health. Pt admits to not caring for himself properly.  Follow Up Recommendations  (pt is declining HH therapies)    Equipment Recommendations  None recommended by OT    Recommendations for Other Services      Precautions / Restrictions Precautions Precautions: Fall Restrictions Weight Bearing Restrictions: No       Mobility Bed Mobility Overal bed mobility: Modified Independent                Transfers Overall transfer level: Needs assistance Equipment used: Rolling walker (2 wheeled) Transfers: Sit to/from Stand Sit to Stand: Supervision         General transfer comment: decreased control of descent    Balance Overall balance assessment: History of Falls;Needs assistance Sitting-balance support: No upper extremity supported;Feet supported Sitting balance-Leahy Scale: Good       Standing balance-Leahy Scale: Fair Standing balance comment: statically                           ADL either performed or assessed with clinical judgement   ADL Overall ADL's : Needs assistance/impaired     Grooming: Wash/dry hands;Standing;Supervision/safety           Upper Body Dressing : Set up;Sitting   Lower Body Dressing: Supervision/safety;Sit to/from stand   Toilet Transfer: Supervision/safety;Ambulation;RW   Toileting- Clothing Manipulation and Hygiene: Supervision/safety;Sit to/from  stand       Functional mobility during ADLs: Supervision/safety;Rolling walker General ADL Comments: pt requesting use of walker in halls, but reports a walker will not fit in his home nor will he be able to use 02 due to his wife's hoarding behavior     Vision       Perception     Praxis      Cognition Arousal/Alertness: Awake/alert Behavior During Therapy: WFL for tasks assessed/performed Overall Cognitive Status: Within Functional Limits for tasks assessed                                 General Comments: pt reports he does not take care of himself and that's why he keeps returning to the hospital        Exercises     Shoulder Instructions       General Comments      Pertinent Vitals/ Pain       Pain Assessment: No/denies pain  Home Living Family/patient expects to be discharged to:: Private residence                                        Prior Functioning/Environment              Frequency  Min 2X/week        Progress Toward Goals  OT Goals(current goals can now be found in the care plan section)  Progress towards OT goals: Progressing toward goals  Acute Rehab OT Goals Patient Stated Goal: return home as quickly as possible OT Goal Formulation: With patient Time For Goal Achievement: 01/31/18 Potential to Achieve Goals: Good  Plan Discharge plan remains appropriate    Co-evaluation                 AM-PAC PT "6 Clicks" Daily Activity     Outcome Measure   Help from another person eating meals?: None Help from another person taking care of personal grooming?: A Little Help from another person toileting, which includes using toliet, bedpan, or urinal?: A Little Help from another person bathing (including washing, rinsing, drying)?: A Little Help from another person to put on and taking off regular upper body clothing?: None Help from another person to put on and taking off regular lower body  clothing?: A Little 6 Click Score: 20    End of Session Equipment Utilized During Treatment: Gait belt;Rolling walker;Oxygen(2L)  OT Visit Diagnosis: Unsteadiness on feet (R26.81);Other abnormalities of gait and mobility (R26.89);Muscle weakness (generalized) (M62.81)   Activity Tolerance Patient tolerated treatment well   Patient Left in chair;with call bell/phone within reach   Nurse Communication          Time: 2683-4196 OT Time Calculation (min): 33 min  Charges: OT General Charges $OT Visit: 1 Visit OT Treatments $Self Care/Home Management : 23-37 mins  01/18/2018 Nestor Lewandowsky, OTR/L Pager: [phone]   Werner Lean Haze Boyden 01/18/2018, 11:04 AM

## 2018-01-18 NOTE — Consult Note (Signed)
   [facility] CM Inpatient Consult   01/18/2018  TRE SANKER Mar 31, 1932 [id number]   Patient assessed for high risk for unplanned readmission in  [place] [facility] services. Patient is in the [place] of the [place] [facility] services under patient's Medicare plan.  Met with the patient at the bedside. He states he still travels. He and his wife has a personal care giver and their daughter is a Marine scientist.    Brent Taillon (Spouse) Lavone Orn (Daughter)    [phone]     Primary CareProvider: Domenick Gong This office provides the transition of care call.   Patient declines needs at this time. Inpatient RNCM set up home health RN for follow up. Patient declines automated phone calls. Patient given a brochure and 24 hour nurse advise line with contact information.   For questions contact:   Natividad Brood, RN BSN [facility] Liaison  [phone] business mobile phone Toll free office [phone]

## 2018-01-18 NOTE — Discharge Instructions (Signed)
Weigh daily and if wt increases by 3 lbs in a day or 5 lbs in a week call Dr. Jerilynn Mages. Croitoru's office.   Low salt diet  Dr. Olin Pia office should call concerning upgrade of you pacemaker.

## 2018-01-19 ENCOUNTER — Telehealth: Payer: Self-pay

## 2018-01-19 DIAGNOSIS — G4733 Obstructive sleep apnea (adult) (pediatric): Secondary | ICD-10-CM | POA: Diagnosis not present

## 2018-01-19 DIAGNOSIS — I130 Hypertensive heart and chronic kidney disease with heart failure and stage 1 through stage 4 chronic kidney disease, or unspecified chronic kidney disease: Secondary | ICD-10-CM | POA: Diagnosis not present

## 2018-01-19 DIAGNOSIS — K2980 Duodenitis without bleeding: Secondary | ICD-10-CM | POA: Diagnosis not present

## 2018-01-19 DIAGNOSIS — Z96653 Presence of artificial knee joint, bilateral: Secondary | ICD-10-CM | POA: Diagnosis not present

## 2018-01-19 DIAGNOSIS — K219 Gastro-esophageal reflux disease without esophagitis: Secondary | ICD-10-CM | POA: Diagnosis not present

## 2018-01-19 DIAGNOSIS — I252 Old myocardial infarction: Secondary | ICD-10-CM | POA: Diagnosis not present

## 2018-01-19 DIAGNOSIS — M109 Gout, unspecified: Secondary | ICD-10-CM | POA: Diagnosis not present

## 2018-01-19 DIAGNOSIS — N183 Chronic kidney disease, stage 3 (moderate): Secondary | ICD-10-CM | POA: Diagnosis not present

## 2018-01-19 DIAGNOSIS — Z87891 Personal history of nicotine dependence: Secondary | ICD-10-CM | POA: Diagnosis not present

## 2018-01-19 DIAGNOSIS — E785 Hyperlipidemia, unspecified: Secondary | ICD-10-CM | POA: Diagnosis not present

## 2018-01-19 DIAGNOSIS — J449 Chronic obstructive pulmonary disease, unspecified: Secondary | ICD-10-CM | POA: Diagnosis not present

## 2018-01-19 DIAGNOSIS — Z9181 History of falling: Secondary | ICD-10-CM | POA: Diagnosis not present

## 2018-01-19 DIAGNOSIS — I428 Other cardiomyopathies: Secondary | ICD-10-CM | POA: Diagnosis not present

## 2018-01-19 DIAGNOSIS — I272 Pulmonary hypertension, unspecified: Secondary | ICD-10-CM | POA: Diagnosis not present

## 2018-01-19 DIAGNOSIS — I482 Chronic atrial fibrillation: Secondary | ICD-10-CM | POA: Diagnosis not present

## 2018-01-19 DIAGNOSIS — I5022 Chronic systolic (congestive) heart failure: Secondary | ICD-10-CM | POA: Diagnosis not present

## 2018-01-19 DIAGNOSIS — F0390 Unspecified dementia without behavioral disturbance: Secondary | ICD-10-CM | POA: Diagnosis not present

## 2018-01-19 DIAGNOSIS — Z9981 Dependence on supplemental oxygen: Secondary | ICD-10-CM | POA: Diagnosis not present

## 2018-01-19 DIAGNOSIS — I2510 Atherosclerotic heart disease of native coronary artery without angina pectoris: Secondary | ICD-10-CM | POA: Diagnosis not present

## 2018-01-19 DIAGNOSIS — Z950 Presence of cardiac pacemaker: Secondary | ICD-10-CM | POA: Diagnosis not present

## 2018-01-19 DIAGNOSIS — I429 Cardiomyopathy, unspecified: Secondary | ICD-10-CM | POA: Diagnosis not present

## 2018-01-19 NOTE — Telephone Encounter (Signed)
Pt is scheduled for BiV upgrade for July 17 @ 8am. Reviewed pt's pre procedure instructions with his wife, Freda Munro. She understands pre procedure labs, pre procedure shower, where to report to and what time on the day of his procedure. Scheduling will be in contact with her regarding follow up appointments. Instruction letter has been sent via [place]. Pt's wife will call if there are any questions or concerns.

## 2018-01-19 NOTE — Telephone Encounter (Signed)
Left a message to call back.

## 2018-01-19 NOTE — Telephone Encounter (Signed)
-----   Message from [facility] sent at 01/16/2018 12:34 PM EDT ----- Dr. [name] wants you to reach out to the patient to scheduled for a PPM upgrade to CRT-Pacer as an out patient.  He will likely discharge tomorrow 01/17/18.  Thanks renee

## 2018-01-20 NOTE — Telephone Encounter (Signed)
Patient contacted regarding discharge from [facility] on 01-18-18  Patient understands to follow up with provider lawrence on 01-25-18 at 11 am at [facility] Patient understands discharge instructions? yes Patient understands medications and regiment? yes Patient understands to bring all medications to this visit? yes

## 2018-01-24 DIAGNOSIS — N183 Chronic kidney disease, stage 3 (moderate): Secondary | ICD-10-CM | POA: Diagnosis not present

## 2018-01-24 DIAGNOSIS — Z9981 Dependence on supplemental oxygen: Secondary | ICD-10-CM | POA: Diagnosis not present

## 2018-01-24 DIAGNOSIS — G4733 Obstructive sleep apnea (adult) (pediatric): Secondary | ICD-10-CM | POA: Diagnosis not present

## 2018-01-24 DIAGNOSIS — Z7901 Long term (current) use of anticoagulants: Secondary | ICD-10-CM | POA: Diagnosis not present

## 2018-01-24 DIAGNOSIS — I5023 Acute on chronic systolic (congestive) heart failure: Secondary | ICD-10-CM | POA: Diagnosis not present

## 2018-01-24 DIAGNOSIS — I480 Paroxysmal atrial fibrillation: Secondary | ICD-10-CM | POA: Diagnosis not present

## 2018-01-24 DIAGNOSIS — Z6829 Body mass index (BMI) 29.0-29.9, adult: Secondary | ICD-10-CM | POA: Diagnosis not present

## 2018-01-24 DIAGNOSIS — I10 Essential (primary) hypertension: Secondary | ICD-10-CM | POA: Diagnosis not present

## 2018-01-24 DIAGNOSIS — D508 Other iron deficiency anemias: Secondary | ICD-10-CM | POA: Diagnosis not present

## 2018-01-24 DIAGNOSIS — M109 Gout, unspecified: Secondary | ICD-10-CM | POA: Diagnosis not present

## 2018-01-24 DIAGNOSIS — R600 Localized edema: Secondary | ICD-10-CM | POA: Diagnosis not present

## 2018-01-24 DIAGNOSIS — Z950 Presence of cardiac pacemaker: Secondary | ICD-10-CM | POA: Diagnosis not present

## 2018-01-25 ENCOUNTER — Encounter: Payer: Self-pay | Admitting: Adult Health

## 2018-01-25 ENCOUNTER — Ambulatory Visit (INDEPENDENT_AMBULATORY_CARE_PROVIDER_SITE_OTHER): Payer: Medicare Other | Admitting: Adult Health

## 2018-01-25 VITALS — BP 116/60 | HR 71 | Ht 67.0 in | Wt 188.2 lb

## 2018-01-25 DIAGNOSIS — I5022 Chronic systolic (congestive) heart failure: Secondary | ICD-10-CM

## 2018-01-25 DIAGNOSIS — I10 Essential (primary) hypertension: Secondary | ICD-10-CM

## 2018-01-25 DIAGNOSIS — Z950 Presence of cardiac pacemaker: Secondary | ICD-10-CM

## 2018-01-25 DIAGNOSIS — I2510 Atherosclerotic heart disease of native coronary artery without angina pectoris: Secondary | ICD-10-CM | POA: Diagnosis not present

## 2018-01-25 MED ORDER — FUROSEMIDE 80 MG PO TABS: 80.0000 mg | ORAL_TABLET | Freq: Two times a day (BID) | ORAL | 1 refills | Status: DC

## 2018-01-25 NOTE — Patient Instructions (Signed)
Medication Instructions:  NO CHANGES- Your physician recommends that you continue on your current medications as directed. Please refer to the Current Medication list given to you today.  If you need a refill on your cardiac medications before your next appointment, please call your pharmacy.  Follow-Up: Your physician wants you to follow-up in: [place] (NURSE PRACTIONIER), DNP,AACC IF PRIMARY CARDIOLOGIST IS UNAVAILABLE.    Thank you for choosing [facility] at [facility]!!

## 2018-01-25 NOTE — Progress Notes (Signed)
Cardiology Office Note   Date:  01/25/2018   ID:  [name], DOB Dec 24, 1931, MRN [id number]  PCP:  [name], MD  Cardiologist:  Dr. [name]  Chief Complaint  Patient presents with  . Follow-up    Afib/Seen for Dr. [name]     History of Present Illness: [name] is a 84 y.o. male who presents for posthospitalization follow-up, after admission for acute hypercapnic respiratory failure, right heart failure, with echocardiogram revealing EF of 25%.  Other history includes nonischemic cardiomyopathy, pulmonary hypertension, OSA, severe tricuspid regurgitation, permanent atrial fibrillation with slow ventricular response, single-chamber pacemaker in situ, and hypertension.  He was noted that his decompensation was related to medical noncompliance with Lasix which she was not taking at home.  The patient was diuresed 8 L, he was transitioned from IV diuretics to p.o. Lasix 80 mg in a.m. and 40 mg in the p.m.  He had frequent desaturations while ambulating in the hospital and was required to remain on CPAP, discussion for home oxygen institution was had during admission.  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 [name].  EP has planned for pacemaker upgrade to BiV.  This  is scheduled for a BiV upgrade on July 17 through EP.  He is feeling better but continues some fatigue. He is now O2 dependent. He is medically compliant and weighing daily. His daughter is a Marine scientist and fills his medication dispenser for him.  His weight has been stable.   Past Medical History:  Diagnosis Date  . Arthritis    "feet" (09/30/2017)  . CHF (congestive heart failure) ([place])   . Chronic bronchitis ([place])   . CKD (chronic kidney disease), stage III ([place])    [name] 09/30/2017  . Coronary artery disease   . Diverticulitis   . GERD (gastroesophageal reflux disease)   . Gout     . History of blood transfusion    "blood loss" (09/30/2017)  . Hyperlipidemia   . Hypertension   . MI (myocardial infarction) ([place]) ~ 2000   "light one"  . Nonischemic cardiomyopathy (HCC)    mild  . OSA on CPAP   . Permanent atrial fibrillation (HCC)    on Eliquis  . Presence of permanent cardiac pacemaker   . Pulmonary hypertension ([place])   . Restless legs     Past Surgical History:  Procedure Laterality Date  . APPENDECTOMY  12/2000   [name] 12/22/2010  . CARDIAC CATHETERIZATION  11/07/2008   nonischemic cardiomyopathy,pulmonary hypertension  . CATARACT EXTRACTION W/ INTRAOCULAR LENS  IMPLANT, BILATERAL Bilateral   . CIRCUMCISION  12/2005   [name] 12/22/2010  . COLOSTOMY  12/2000   [name] 12/22/2010  . COLOSTOMY REVERSAL  07/2001   [name] 12/22/2010  . CORONARY ANGIOPLASTY  06/01/1999   successful to ostium of the first diagonal  . ESOPHAGOGASTRODUODENOSCOPY N/A 08/22/2013   Procedure: ESOPHAGOGASTRODUODENOSCOPY (EGD);  Surgeon: [name], MD;  Location: [facility] ENDOSCOPY;  Service: Endoscopy;  Laterality: N/A;  h/p in file cabinet, jackie  . ESOPHAGOGASTRODUODENOSCOPY N/A 11/05/2014   Procedure: ESOPHAGOGASTRODUODENOSCOPY (EGD);  Surgeon: Clarene Essex, MD;  Location[facility] END[facility]COPY;  Service: Endoscopy;  Laterality: N/A;  . ESOPHAGOGASTRODUODENOSCOPY N/A 11/08/2016   Procedure: ESOPHAGOGASTRODUODENOSCOPY (EGD);  Surgeon: [name], MD;  Location: [facility] ENDOSCOPY;  Service: Endoscopy;  Laterality: N/A;  . ESOPHAGOGASTRODUODENOSCOPY (EGD) WITH PROPOFOL Left 11/05/2017   Procedure: ESOPHAGOGASTRODUODENOSCOPY (EGD) WITH  PROPOFOL;  Surgeon: [name], MD;  Location: [facility] ENDOSCOPY;  Service: Endoscopy;  Laterality: Left;  . HOT HEMOSTASIS N/A 11/05/2014   Procedure: HOT HEMOSTASIS (ARGON PLASMA COAGULATION/BICAP);  Surgeon: Clarene Essex, MD;  Location: [facility] ENDOSCOPY;  Service: Endoscopy;  Laterality: N/A;  . INSERT / REPLACE / REMOVE PACEMAKER    . JOINT REPLACEMENT    . MASS EXCISION  Left    hand w/ulnar artery reconstruction/notes 12/22/2010  . NM MYOCAR PERF WALL MOTION  11/24/2007   normal  . PERMANENT PACEMAKER INSERTION  10/04/2012   [employer]  . PERMANENT PACEMAKER INSERTION N/A 10/12/2011   Procedure: PERMANENT PACEMAKER INSERTION;  Surgeon: [name], MD;  Location: [facility] CATH LAB;  Service: Cardiovascular;  Laterality: N/A;  . REPLACEMENT TOTAL KNEE Bilateral 11/2006   right-left/notes 12/22/2010  . ROTATOR CUFF REPAIR Right 09/2004   [name] 12/22/2010  . SAVORY DILATION N/A 08/22/2013   Procedure: SAVORY DILATION;  Surgeon: [name], MD;  Location: [facility] ENDOSCOPY;  Service: Endoscopy;  Laterality: N/A;  . SHOULDER SURGERY Right    "fell off house; messed up 3 things in my arm"  . TONSILLECTOMY    . US ECHOCARDIOGRAPHY  02/01/2011   LA is mod-severely dilated,AOV & root sclerotic,ca+ AOV leaflets     Current Outpatient Medications  Medication Sig Dispense Refill  . acetaminophen (TYLENOL) 650 MG CR tablet Take 650 mg by mouth every 8 (eight) hours as needed for pain.    [name] allopurinol (ZYLOPRIM) 100 MG tablet Take 2 tablets (200 mg total) by mouth daily. 30 tablet 1  . Amino Acids-Protein Hydrolys (FEEDING SUPPLEMENT, PRO-STAT SUGAR FREE 64,) LIQD Take 30 mLs by mouth 2 (two) times daily. 900 mL 0  . colchicine 0.6 MG tablet Take 0.6 mg by mouth daily as needed (gout flare). Colcrys    . dutasteride (AVODART) 0.5 MG capsule Take 0.5 mg by mouth daily.    [name] escitalopram (LEXAPRO) 5 MG tablet Take 1 tablet (5 mg total) by mouth at bedtime. 30 tablet 0  . ferrous sulfate 325 (65 FE) MG tablet Take 1 tablet (325 mg total) by mouth 2 (two) times daily with a meal. 180 tablet 3  . furosemide (LASIX) 80 MG tablet Take 1 tablet (80 mg total) by mouth 2 (two) times daily. 180 tablet 1  . guaiFENesin (MUCINEX) 600 MG 12 hr tablet Take 1 tablet (600 mg total) by mouth 2 (two) times daily as needed for cough. 30 tablet 0  . hydrOXYzine (ATARAX/VISTARIL) 25 MG  tablet TAKE 1 TABLET BY MOUTH EVERY 6 HOURS AS NEEDED FOR  ITCHING 90 tablet 2  . mirtazapine (REMERON) 15 MG tablet Take 15 mg by mouth at bedtime.    . Multiple Vitamin (MULTIVITAMIN WITH MINERALS) TABS tablet Take 1 tablet by mouth daily.    . Multiple Vitamins-Minerals (PRESERVISION AREDS 2) CAPS Take 1 capsule by mouth daily.    . pantoprazole (PROTONIX) 40 MG tablet Take 1 tablet (40 mg total) by mouth daily. (Patient taking differently: Take 40 mg by mouth 2 (two) times daily. ) 60 tablet 0  . potassium chloride (K-DUR) 10 MEQ tablet Take 1 tablet (10 mEq total) by mouth daily. 90 tablet 3  . rOPINIRole (REQUIP) 3 MG tablet Take 3 mg by mouth at bedtime.     . Tamsulosin HCl (FLOMAX) 0.4 MG CAPS Take 0.4 mg by mouth daily after breakfast.    . traMADol (ULTRAM) 50 MG tablet Take 100 mg by mouth 2 (two) times  daily. For pain      No current facility-administered medications for this visit.     Allergies:   Vicodin [hydrocodone-acetaminophen]    Social History:  The patient  reports that he has quit smoking. He has never used smokeless tobacco. He reports that he does not drink alcohol or use drugs.   Family History:  The patient's family history includes Cancer in his mother; Diabetes in his brother; Heart attack in his father.    ROS: All other systems are reviewed and negative. Unless otherwise mentioned in H&P    PHYSICAL EXAM: VS:  BP 116/60   Pulse 71   Ht 5\' 7"  (1.702 m)   Wt 188 lb 3.2 oz (85.4 kg)   SpO2 97%   BMI 29.48 kg/m  , BMI Body mass index is 29.48 kg/m. GEN: Well nourished, well developed, in no acute distress  HEENT: normal  Neck: no JVD, carotid bruits, or masses Cardiac: IRRR;distant heart sounds, soft systolic murmurs, rubs, or gallops,no edema  Respiratory:  clear to auscultation bilaterally, normal work of breathing, wearing O2 via [place] GI: soft, nontender, nondistended, + BS MS: no deformity or atrophy Significant kyphosis is noted.  Skin: warm and  dry, no rash Neuro:  Strength and sensation are intact are diminished.  Psych: euthymic mood, full affect   EKG:  Not completed this office visit.   Recent Labs: 11/03/2017: TSH 3.099 01/12/2018: B Natriuretic Peptide 1,360.9; Magnesium 2.2 01/14/2018: ALT 104 01/17/2018: BUN 29; Creatinine, Ser 1.03; Hemoglobin 8.4; Platelets 174; Potassium 4.0; Sodium 141    Lipid Panel    Component Value Date/Time   CHOL 117 11/04/2017 0458   TRIG 61 11/04/2017 0458   HDL 41 11/04/2017 0458   CHOLHDL 2.9 11/04/2017 0458   VLDL 12 11/04/2017 0458   LDLCALC 64 11/04/2017 0458      Wt Readings from Last 3 Encounters:  01/25/18 188 lb 3.2 oz (85.4 kg)  01/18/18 196 lb 3.2 oz (89 kg)  11/14/17 186 lb (84.4 kg)      Other studies Reviewed: Echocardiogram January 26, 2018  Left ventricle: The cavity size was normal. Systolic function was   severely reduced. The estimated ejection fraction was in the   range of 25% to 30%. There is akinesis of the   mid-apicalanteroseptal and inferoseptal myocardium. The study is   not technically sufficient to allow evaluation of LV diastolic   function. - Aortic valve: Trileaflet; moderately thickened, moderately   calcified leaflets. Transvalvular velocity was minimally   increased. There was no stenosis. There was mild regurgitation.   Peak velocity (S): 233 cm/s. Valve area (VTI): 1.49 cm^2. Valve   area (Vmax): 1.6 cm^2. Valve area (Vmean): 1.44 cm^2. - Mitral valve: There was mild regurgitation. - Left atrium: The atrium was severely dilated. - Right ventricle: The cavity size was moderately dilated. Wall   thickness was normal. Pacer wire or catheter noted in right   ventricle. - Right atrium: The atrium was severely dilated. - Tricuspid valve: There was moderate regurgitation. - Pulmonary arteries: Systolic pressure was moderately increased.   PA peak pressure: 53 mm Hg (S).  ASSESSMENT AND PLAN:  1.  HFrEF: Echocardiogram revealing EF of 25%.  The  patient appears euvolemic today he is weighing himself daily, taking his medications as directed, and avoiding salted foods.  His daughter who is a Marine scientist is very attentive and checks on him daily.  His weight has been stable.  He denies any new symptoms.  I have reinforced  the low-sodium diet and daily weights and to take extra Lasix for 2 to 3 pound weight gain.  The patient is to call us if he is having to take extra doses of Lasix consistently.  At which time we may need to increase his dose of diuretic.  2.  Single-chamber pacemaker in situ: Patient is scheduled for Bi- V pacemaker implantation July 2019.  He is already aware of this scheduled appointment and procedure.  3.  Chronic kidney disease stage III: Creatinine dated 01/17/2018 1.03.  Pre-pacemaker insertion labs will be completed at the discretion of electrophysiology.  4.  History of atrial fibrillation: Currently not on anticoagulation therapy.  5.  COPD-oxygen dependent: The patient was followed by primary care.  Current medicines are reviewed at length with the patient today.    Labs/ tests ordered today include: none Phill Myron. [place] Pugh, ANP, AACC   01/25/2018 11:50 AM    [facility]. [address] [place], [place] [address] Phone: [phone]; Fax: [phone]

## 2018-01-25 NOTE — Progress Notes (Signed)
Thank you, Rico Sheehan

## 2018-01-31 DIAGNOSIS — I5022 Chronic systolic (congestive) heart failure: Secondary | ICD-10-CM | POA: Diagnosis not present

## 2018-01-31 DIAGNOSIS — I2510 Atherosclerotic heart disease of native coronary artery without angina pectoris: Secondary | ICD-10-CM | POA: Diagnosis not present

## 2018-01-31 DIAGNOSIS — I482 Chronic atrial fibrillation: Secondary | ICD-10-CM | POA: Diagnosis not present

## 2018-01-31 DIAGNOSIS — N183 Chronic kidney disease, stage 3 (moderate): Secondary | ICD-10-CM | POA: Diagnosis not present

## 2018-01-31 DIAGNOSIS — I252 Old myocardial infarction: Secondary | ICD-10-CM | POA: Diagnosis not present

## 2018-01-31 DIAGNOSIS — I130 Hypertensive heart and chronic kidney disease with heart failure and stage 1 through stage 4 chronic kidney disease, or unspecified chronic kidney disease: Secondary | ICD-10-CM | POA: Diagnosis not present

## 2018-02-04 DIAGNOSIS — I5022 Chronic systolic (congestive) heart failure: Secondary | ICD-10-CM | POA: Diagnosis not present

## 2018-02-04 DIAGNOSIS — I252 Old myocardial infarction: Secondary | ICD-10-CM | POA: Diagnosis not present

## 2018-02-04 DIAGNOSIS — I482 Chronic atrial fibrillation: Secondary | ICD-10-CM | POA: Diagnosis not present

## 2018-02-04 DIAGNOSIS — I130 Hypertensive heart and chronic kidney disease with heart failure and stage 1 through stage 4 chronic kidney disease, or unspecified chronic kidney disease: Secondary | ICD-10-CM | POA: Diagnosis not present

## 2018-02-04 DIAGNOSIS — I2510 Atherosclerotic heart disease of native coronary artery without angina pectoris: Secondary | ICD-10-CM | POA: Diagnosis not present

## 2018-02-04 DIAGNOSIS — N183 Chronic kidney disease, stage 3 (moderate): Secondary | ICD-10-CM | POA: Diagnosis not present

## 2018-02-16 ENCOUNTER — Telehealth: Payer: Self-pay

## 2018-02-16 NOTE — Telephone Encounter (Signed)
Attempted to call pt's home and cell number several times. There is no VM set up on cell number and home number was busy.   LVM with pt's wife stating his procedure arrival time has been changed on July 17th from 0630 to 0830.

## 2018-02-21 ENCOUNTER — Other Ambulatory Visit: Payer: Medicare Other

## 2018-02-21 DIAGNOSIS — I428 Other cardiomyopathies: Secondary | ICD-10-CM

## 2018-02-21 LAB — BASIC METABOLIC PANEL WITH GFR
BUN/Creatinine Ratio: 35 — ABNORMAL HIGH (ref 10–24)
BUN: 54 mg/dL — ABNORMAL HIGH (ref 8–27)
CO2: 28 mmol/L (ref 20–29)
Calcium: 8.8 mg/dL (ref 8.6–10.2)
Chloride: 96 mmol/L (ref 96–106)
Creatinine, Ser: 1.56 mg/dL — ABNORMAL HIGH (ref 0.76–1.27)
GFR calc Af Amer: 46 mL/min/1.73 — ABNORMAL LOW
GFR calc non Af Amer: 40 mL/min/1.73 — ABNORMAL LOW
Glucose: 85 mg/dL (ref 65–99)
Potassium: 4.3 mmol/L (ref 3.5–5.2)
Sodium: 139 mmol/L (ref 134–144)

## 2018-02-21 LAB — CBC WITH DIFFERENTIAL/PLATELET
Basophils Absolute: 0 10*3/uL (ref 0.0–0.2)
Basos: 0 %
EOS (ABSOLUTE): 0.1 10*3/uL (ref 0.0–0.4)
EOS: 3 %
HEMATOCRIT: 26.9 % — AB (ref 37.5–51.0)
HEMOGLOBIN: 9 g/dL — AB (ref 13.0–17.7)
Immature Grans (Abs): 0 10*3/uL (ref 0.0–0.1)
Immature Granulocytes: 0 %
LYMPHS: 35 %
Lymphocytes Absolute: 1.5 10*3/uL (ref 0.7–3.1)
MCH: 35.7 pg — ABNORMAL HIGH (ref 26.6–33.0)
MCHC: 33.5 g/dL (ref 31.5–35.7)
MCV: 107 fL — AB (ref 79–97)
MONOS ABS: 0.5 10*3/uL (ref 0.1–0.9)
Monocytes: 11 %
NEUTROS PCT: 51 %
Neutrophils Absolute: 2.2 10*3/uL (ref 1.4–7.0)
Platelets: 147 10*3/uL — ABNORMAL LOW (ref 150–450)
RBC: 2.52 x10E6/uL — AB (ref 4.14–5.80)
RDW: 15.4 % (ref 12.3–15.4)
WBC: 4.3 10*3/uL (ref 3.4–10.8)

## 2018-02-22 ENCOUNTER — Ambulatory Visit (HOSPITAL_COMMUNITY)
Admission: RE | Disposition: A | Discharge status: Home / Self Care | Payer: Self-pay | Source: Ambulatory Visit | Attending: Internal Medicine

## 2018-02-22 ENCOUNTER — Ambulatory Visit (HOSPITAL_COMMUNITY)
Admission: RE | Admit: 2018-02-22 | Discharge: 2018-02-22 | Disposition: A | Discharge status: Home / Self Care | Payer: Medicare Other | Source: Ambulatory Visit | Attending: Internal Medicine | Admitting: Internal Medicine

## 2018-02-22 DIAGNOSIS — I2510 Atherosclerotic heart disease of native coronary artery without angina pectoris: Secondary | ICD-10-CM | POA: Insufficient documentation

## 2018-02-22 DIAGNOSIS — I428 Other cardiomyopathies: Secondary | ICD-10-CM | POA: Insufficient documentation

## 2018-02-22 DIAGNOSIS — E785 Hyperlipidemia, unspecified: Secondary | ICD-10-CM | POA: Insufficient documentation

## 2018-02-22 DIAGNOSIS — I482 Chronic atrial fibrillation: Secondary | ICD-10-CM | POA: Insufficient documentation

## 2018-02-22 DIAGNOSIS — Z87891 Personal history of nicotine dependence: Secondary | ICD-10-CM | POA: Insufficient documentation

## 2018-02-22 DIAGNOSIS — I5022 Chronic systolic (congestive) heart failure: Secondary | ICD-10-CM | POA: Diagnosis not present

## 2018-02-22 DIAGNOSIS — Z950 Presence of cardiac pacemaker: Secondary | ICD-10-CM | POA: Diagnosis not present

## 2018-02-22 DIAGNOSIS — Z885 Allergy status to narcotic agent status: Secondary | ICD-10-CM | POA: Diagnosis not present

## 2018-02-22 DIAGNOSIS — I252 Old myocardial infarction: Secondary | ICD-10-CM | POA: Diagnosis not present

## 2018-02-22 DIAGNOSIS — G4733 Obstructive sleep apnea (adult) (pediatric): Secondary | ICD-10-CM | POA: Insufficient documentation

## 2018-02-22 DIAGNOSIS — Z7901 Long term (current) use of anticoagulants: Secondary | ICD-10-CM | POA: Diagnosis not present

## 2018-02-22 DIAGNOSIS — Z539 Procedure and treatment not carried out, unspecified reason: Secondary | ICD-10-CM | POA: Insufficient documentation

## 2018-02-22 DIAGNOSIS — D61818 Other pancytopenia: Secondary | ICD-10-CM | POA: Diagnosis not present

## 2018-02-22 DIAGNOSIS — J42 Unspecified chronic bronchitis: Secondary | ICD-10-CM | POA: Insufficient documentation

## 2018-02-22 DIAGNOSIS — G2581 Restless legs syndrome: Secondary | ICD-10-CM | POA: Insufficient documentation

## 2018-02-22 DIAGNOSIS — K219 Gastro-esophageal reflux disease without esophagitis: Secondary | ICD-10-CM | POA: Insufficient documentation

## 2018-02-22 DIAGNOSIS — M1990 Unspecified osteoarthritis, unspecified site: Secondary | ICD-10-CM | POA: Diagnosis not present

## 2018-02-22 DIAGNOSIS — M109 Gout, unspecified: Secondary | ICD-10-CM | POA: Diagnosis not present

## 2018-02-22 DIAGNOSIS — N183 Chronic kidney disease, stage 3 (moderate): Secondary | ICD-10-CM | POA: Insufficient documentation

## 2018-02-22 DIAGNOSIS — I272 Pulmonary hypertension, unspecified: Secondary | ICD-10-CM | POA: Insufficient documentation

## 2018-02-22 DIAGNOSIS — I130 Hypertensive heart and chronic kidney disease with heart failure and stage 1 through stage 4 chronic kidney disease, or unspecified chronic kidney disease: Secondary | ICD-10-CM | POA: Diagnosis not present

## 2018-02-22 HISTORY — PX: BIV UPGRADE: EP1202

## 2018-02-22 LAB — SURGICAL PCR SCREEN
MRSA, PCR: NEGATIVE
Staphylococcus aureus: NEGATIVE

## 2018-02-22 SURGERY — BIV UPGRADE

## 2018-02-22 MED ORDER — CHLORHEXIDINE GLUCONATE 4 % EX LIQD: 60.0000 mL | Freq: Once | CUTANEOUS | Status: DC

## 2018-02-22 MED ORDER — SODIUM CHLORIDE 0.9 % IV SOLN
INTRAVENOUS | Status: AC
  Filled 2018-02-22: qty 2

## 2018-02-22 MED ORDER — SODIUM CHLORIDE 0.9 % IV SOLN
INTRAVENOUS | Status: DC
  Administered 2018-02-22: 10:00:00 via INTRAVENOUS

## 2018-02-22 MED ORDER — MUPIROCIN 2 % EX OINT
TOPICAL_OINTMENT | CUTANEOUS | Status: AC
  Filled 2018-02-22: qty 22

## 2018-02-22 MED ORDER — IOPAMIDOL (ISOVUE-370) INJECTION 76%
INTRAVENOUS | Status: AC
  Filled 2018-02-22: qty 50

## 2018-02-22 MED ORDER — CEFAZOLIN SODIUM-DEXTROSE 2-4 GM/100ML-% IV SOLN
2.0000 g | INTRAVENOUS | Status: AC
  Administered 2018-02-22: 2 g via INTRAVENOUS
  Filled 2018-02-22: qty 100

## 2018-02-22 MED ORDER — MUPIROCIN 2 % EX OINT
1.0000 "application " | TOPICAL_OINTMENT | Freq: Once | CUTANEOUS | Status: AC
  Administered 2018-02-22: 1 via TOPICAL
  Filled 2018-02-22: qty 22

## 2018-02-22 MED ORDER — LIDOCAINE HCL (PF) 1 % IJ SOLN
INTRAMUSCULAR | Status: AC
  Filled 2018-02-22: qty 60

## 2018-02-22 MED ORDER — GENTAMICIN SULFATE 40 MG/ML IJ SOLN: 80.0000 mg | INTRAMUSCULAR | Status: DC

## 2018-02-22 MED ORDER — CEFAZOLIN SODIUM-DEXTROSE 2-4 GM/100ML-% IV SOLN
INTRAVENOUS | Status: AC
  Filled 2018-02-22: qty 100

## 2018-02-22 SURGICAL SUPPLY — 5 items
ATTRACTOMAT 16X20 MAGNETIC DRP (DRAPES) ×2 IMPLANT
CABLE SURGICAL S-101-97-12 (CABLE) ×3 IMPLANT
PAD DEFIB LIFELINK (PAD) ×2 IMPLANT
SHEATH CLASSIC 9.5F (SHEATH) ×2 IMPLANT
TRAY PACEMAKER INSERTION (PACKS) ×3 IMPLANT

## 2018-02-22 NOTE — H&P (Signed)
Patient Care Team: [name], [name] Him, MD as PCP - General (Internal Medicine) Croitoru, Dani Gobble, MD as PCP - Cardiology (Cardiology) Croitoru, Dani Gobble, MD as Consulting Physician (Cardiology)   HPI  [name] is a 84 y.o. male Admitted for CRT-P upgrade  Nonischemic cardiomyopathy w rapidly declining EF and hospitalizations for CHF Vpacing percentage about 70%   TEST EF   2010 LHC  Normal CAs   2/16 Echo  55-60%   3/18 Echo   55-60 % Severe LAE  2/19 Echo   40-45 %   6/19 Echo  20-25%    Currently not anticoagulated 2/2 on gong bleeding *  Date Cr K Hgb  6/19 1.13 3.6 8.4  7/19 1.56 4.3 9.0    He has pancytopenia and angiodysplastic lesions in prox GI track  Moderate DOE-- can mitigate if walks slowly , 2 pillow orthopnea alleviated by CPAP Some edema  No chest pain   Records and Results Reviewed   Past Medical History:  Diagnosis Date  . Arthritis    "feet" (09/30/2017)  . CHF (congestive heart failure) ([place])   . Chronic bronchitis ([place])   . CKD (chronic kidney disease), stage III ([place])    [name] 09/30/2017  . Coronary artery disease   . Diverticulitis   . GERD (gastroesophageal reflux disease)   . Gout   . History of blood transfusion    "blood loss" (09/30/2017)  . Hyperlipidemia   . Hypertension   . MI (myocardial infarction) ([place]) ~ 2000   "light one"  . Nonischemic cardiomyopathy (HCC)    mild  . OSA on CPAP   . Permanent atrial fibrillation (HCC)    on Eliquis  . Presence of permanent cardiac pacemaker   . Pulmonary hypertension ([place])   . Restless legs     Past Surgical History:  Procedure Laterality Date  . APPENDECTOMY  12/2000   [name] 12/22/2010  . CARDIAC CATHETERIZATION  11/07/2008   nonischemic cardiomyopathy,pulmonary hypertension  . CATARACT EXTRACTION W/ INTRAOCULAR LENS  IMPLANT, BILATERAL Bilateral   . CIRCUMCISION  12/2005   [name] 12/22/2010  . COLOSTOMY  12/2000   [name] 12/22/2010  . COLOSTOMY REVERSAL  07/2001     [name] 12/22/2010  . CORONARY ANGIOPLASTY  06/01/1999   successful to ostium of the first diagonal  . ESOPHAGOGASTRODUODENOSCOPY N/A 08/22/2013   Procedure: ESOPHAGOGASTRODUODENOSCOPY (EGD);  Surgeon: [name], MD;  Location: [facility] ENDOSCOPY;  Service: Endoscopy;  Laterality: N/A;  h/p in file cabinet, jackie  . ESOPHAGOGASTRODUODENOSCOPY N/A 11/05/2014   Procedure: ESOPHAGOGASTRODUODENOSCOPY (EGD);  Surgeon: Clarene Essex, MD;  Location: [facility] ENDOSCOPY;  Service: Endoscopy;  Laterality: N/A;  . ESOPHAGOGASTRODUODENOSCOPY N/A 11/08/2016   Procedure: ESOPHAGOGASTRODUODENOSCOPY (EGD);  Surgeon: [name], MD;  Location: [facility] ENDOSCOPY;  Service: Endoscopy;  Laterality: N/A;  . ESOPHAGOGASTRODUODENOSCOPY (EGD) WITH PROPOFOL Left 11/05/2017   Procedure: ESOPHAGOGASTRODUODENOSCOPY (EGD) WITH PROPOFOL;  Surgeon: [name], MD;  Location: [facility];  Service: Endoscopy;  Laterality: Left;  . HOT HEMOSTASIS N/A 11/05/2014   Procedure: HOT HEMOSTASIS (ARGON PLASMA COAGULATION/BICAP);  Surgeon: Clarene Essex, MD;  Location: [facility] ENDOSCOPY;  Service: Endoscopy;  Laterality: N/A;  . INSERT / REPLACE / REMOVE PACEMAKER    . JOINT REPLACEMENT    . MASS EXCISION Left    hand w/ulnar artery reconstruction/notes 12/22/2010  . NM MYOCAR PERF WALL MOTION  11/24/2007   normal  . PERMANENT PACEMAKER INSERTION  10/04/2012   [employer]  . PERMANENT PACEMAKER INSERTION N/A 10/12/2011  Procedure: PERMANENT PACEMAKER INSERTION;  Surgeon: [name], MD;  Location: [facility] CATH LAB;  Service: Cardiovascular;  Laterality: N/A;  . REPLACEMENT TOTAL KNEE Bilateral 11/2006   right-left/notes 12/22/2010  . ROTATOR CUFF REPAIR Right 09/2004   [name] 12/22/2010  . SAVORY DILATION N/A 08/22/2013   Procedure: SAVORY DILATION;  Surgeon: [name], MD;  Location: [facility] ENDOSCOPY;  Service: Endoscopy;  Laterality: N/A;  . SHOULDER SURGERY Right    "fell off house; messed up 3 things in my arm"  . TONSILLECTOMY    . US  ECHOCARDIOGRAPHY  02/01/2011   LA is mod-severely dilated,AOV & root sclerotic,ca+ AOV leaflets    Current Facility-Administered Medications  Medication Dose Route Frequency Provider Last Rate Last Dose  . mupirocin ointment (BACTROBAN) 2 %             Allergies  Allergen Reactions  . Vicodin [Hydrocodone-Acetaminophen] Itching and Nausea Only      Social History   Tobacco Use  . Smoking status: Former Research scientist (life sciences)  . Smokeless tobacco: Never Used  . Tobacco comment: 09/30/2017 "it's been over 68yr since I smoked anything"  Substance Use Topics  . Alcohol use: No  . Drug use: No     Family History  Problem Relation Age of Onset  . Cancer Mother   . Heart attack Father   . Diabetes Brother      Current Meds  Medication Sig  . acetaminophen (TYLENOL) 650 MG CR tablet Take 650 mg by mouth every 8 (eight) hours as needed for pain.  [name] allopurinol (ZYLOPRIM) 100 MG tablet Take 2 tablets (200 mg total) by mouth daily.  . Amino Acids-Protein Hydrolys (FEEDING SUPPLEMENT, PRO-STAT SUGAR FREE 64,) LIQD Take 30 mLs by mouth 2 (two) times daily.  . Bisacodyl (LAXATIVE PO) Take 2 tablets by mouth 2 (two) times daily as needed (constipation).  . cetirizine (ZYRTEC) 10 MG tablet Take 10 mg by mouth every evening.  . colchicine 0.6 MG tablet Take 0.6 mg by mouth daily as needed (gout flare). Colcrys  . dutasteride (AVODART) 0.5 MG capsule Take 0.5 mg by mouth daily.  [name] escitalopram (LEXAPRO) 5 MG tablet Take 1 tablet (5 mg total) by mouth at bedtime.  . Ferrous Gluconate (IRON 27 PO) Take 1 tablet by mouth every evening.  . ferrous sulfate 325 (65 FE) MG tablet Take 1 tablet (325 mg total) by mouth 2 (two) times daily with a meal.  . furosemide (LASIX) 80 MG tablet Take 1 tablet (80 mg total) by mouth 2 (two) times daily.  [name] guaiFENesin (MUCINEX) 600 MG 12 hr tablet Take 1 tablet (600 mg total) by mouth 2 (two) times daily as needed for cough.  . hydrOXYzine (ATARAX/VISTARIL) 25 MG tablet TAKE  1 TABLET BY MOUTH EVERY 6 HOURS AS NEEDED FOR  ITCHING  . loratadine (CLARITIN) 10 MG tablet Take 10 mg by mouth daily.  . mirtazapine (REMERON) 15 MG tablet Take 15 mg by mouth at bedtime.  . Multiple Vitamin (MULTIVITAMIN WITH MINERALS) TABS tablet Take 1 tablet by mouth daily.  . Multiple Vitamins-Minerals (PRESERVISION AREDS 2) CAPS Take 1 capsule by mouth daily.  . OXYGEN Inhale 2 L into the lungs daily.  . pantoprazole (PROTONIX) 40 MG tablet Take 1 tablet (40 mg total) by mouth daily. (Patient taking differently: Take 40 mg by mouth 2 (two) times daily. )  . potassium chloride (K-DUR) 10 MEQ tablet Take 1 tablet (10 mEq total) by mouth daily.  [name] rOPINIRole (REQUIP) 3  MG tablet Take 3 mg by mouth at bedtime.   . Tamsulosin HCl (FLOMAX) 0.4 MG CAPS Take 0.4 mg by mouth daily after breakfast.  . traMADol (ULTRAM) 50 MG tablet Take 100 mg by mouth 2 (two) times daily. For pain      Review of Systems negative except from HPI and PMH  Physical Exam BP (!) (P) 111/55   Pulse (P) 60   Temp (P) 98.7 F (37.1 C) (Oral)   Resp (P) 18   Ht (P) 5\' 7"  (1.702 m)   Wt (P) 180 lb (81.6 kg)   SpO2 (P) 99%   BMI (P) 28.19 kg/m  Well developed and well nourished in no acute distress HENT normal E scleral and icterus clear Neck Supple JVP unable to discern as cant turn neck  carotids brisk and full Diffuse crackles clear mostly with cough No collaterals visible on chest  Regular rate and rhythm, 2/6 sys murmurs  S2  Preserved  gallops or rub Soft with active bowel sounds No clubbing cyanosis  Edema Alert and oriented, grossly normal motor and sensory function Skin Warm and Dry  ECG personally reviewed 01/12/18 afib with V pacing   Assessment and  Plan  Atrial fibrillation-permanent  Bradycardia   Pacemaker-[employer] with V pacing percentage greater than 70  Cardiomyopathy-progressive presumed nonischemic  Congestive heart failure-chronic-systolic grade  0F-0X  Pancytopenia   Renal Insufficiency g 3   Falls-recurrent  Here for CRT upgrade for presumed Pacemaker induced cardiomyopathy   The benefits and risks were reviewed including but not limited to death,  perforation, infection, lead dislodgement and device malfunction.  The patient understands agrees and is willing to proceed.  Will minimize contrast 2/2 acute renal insufficiency

## 2018-02-22 NOTE — Progress Notes (Signed)
Dr [name] notified of lab results and no new orders noted

## 2018-02-22 NOTE — Progress Notes (Signed)
The patient arrived in the lab and could not be made comfortable 2/2 restless legs    He is also not able to lie flat despite CPAP  Given the elective nature, think more safely done using anesthesia support   Pt voiced understanding

## 2018-02-22 NOTE — Progress Notes (Signed)
See previous note

## 2018-02-23 ENCOUNTER — Encounter (HOSPITAL_COMMUNITY): Payer: Self-pay | Admitting: Internal Medicine

## 2018-02-23 DIAGNOSIS — I252 Old myocardial infarction: Secondary | ICD-10-CM | POA: Diagnosis not present

## 2018-02-23 DIAGNOSIS — I5022 Chronic systolic (congestive) heart failure: Secondary | ICD-10-CM | POA: Diagnosis not present

## 2018-02-23 DIAGNOSIS — I2510 Atherosclerotic heart disease of native coronary artery without angina pectoris: Secondary | ICD-10-CM | POA: Diagnosis not present

## 2018-02-23 DIAGNOSIS — I130 Hypertensive heart and chronic kidney disease with heart failure and stage 1 through stage 4 chronic kidney disease, or unspecified chronic kidney disease: Secondary | ICD-10-CM | POA: Diagnosis not present

## 2018-02-23 DIAGNOSIS — I482 Chronic atrial fibrillation: Secondary | ICD-10-CM | POA: Diagnosis not present

## 2018-02-23 DIAGNOSIS — N183 Chronic kidney disease, stage 3 (moderate): Secondary | ICD-10-CM | POA: Diagnosis not present

## 2018-02-23 MED FILL — Gentamicin in Saline Inj 0.8 MG/ML: INTRAVENOUS | Qty: 100 | Status: AC

## 2018-02-23 MED FILL — Lidocaine HCl Local Preservative Free (PF) Inj 1%: INTRAMUSCULAR | Qty: 2 | Status: AC

## 2018-03-01 DIAGNOSIS — I2510 Atherosclerotic heart disease of native coronary artery without angina pectoris: Secondary | ICD-10-CM | POA: Diagnosis not present

## 2018-03-01 DIAGNOSIS — I130 Hypertensive heart and chronic kidney disease with heart failure and stage 1 through stage 4 chronic kidney disease, or unspecified chronic kidney disease: Secondary | ICD-10-CM | POA: Diagnosis not present

## 2018-03-01 DIAGNOSIS — I482 Chronic atrial fibrillation: Secondary | ICD-10-CM | POA: Diagnosis not present

## 2018-03-01 DIAGNOSIS — N183 Chronic kidney disease, stage 3 (moderate): Secondary | ICD-10-CM | POA: Diagnosis not present

## 2018-03-01 DIAGNOSIS — I5022 Chronic systolic (congestive) heart failure: Secondary | ICD-10-CM | POA: Diagnosis not present

## 2018-03-01 DIAGNOSIS — I252 Old myocardial infarction: Secondary | ICD-10-CM | POA: Diagnosis not present

## 2018-03-06 ENCOUNTER — Ambulatory Visit: Payer: Self-pay

## 2018-03-07 NOTE — Progress Notes (Signed)
Cardiology Office Note   Date:  03/08/2018   ID:  [name], DOB 1932/04/25, MRN [id number]  PCP:  [name], MD  Cardiologist: Dr. [name] Chief Complaint  Patient presents with  . Follow-up    procedure completed. denies chest pains, mentions some SOB and slight swelling     History of Present Illness: [name] is a 84 y.o. male who presents for ongoing assessment and management of atrial fib with slow ventricular response, nonischemic cardiomyopathy with rapidly declining EF and frequent hospitalizations for CHF.  Patient had recent admission on 02/22/2018 for CRT-P upgrade by Dr. [name], however, this was cancelled due to inability to control restless legs. He was to have follow up appointment with PCP for this and to also meet with anesthesia. He continues to await these appointments.   Other history includes pulmonary hypertension, OSA, severe tricuspid regurgitation,  Hypertension, medical noncompliance with Lasix.  The patient is oxygen dependent.  He continues to be weak and tired. He feel last evening he feel and hit his head. His daughter is a Marine scientist, who dressed his head laceration with steri-strips. He has no active bleeding.   Past Medical History:  Diagnosis Date  . Arthritis    "feet" (09/30/2017)  . CHF (congestive heart failure) ([place])   . Chronic bronchitis ([place])   . CKD (chronic kidney disease), stage III ([place])    [name] 09/30/2017  . Coronary artery disease   . Diverticulitis   . GERD (gastroesophageal reflux disease)   . Gout   . History of blood transfusion    "blood loss" (09/30/2017)  . Hyperlipidemia   . Hypertension   . MI (myocardial infarction) ([place]) ~ 2000   "light one"  . Nonischemic cardiomyopathy (HCC)    mild  . OSA on CPAP   . Permanent atrial fibrillation (HCC)    on Eliquis  . Presence of permanent cardiac pacemaker   . Pulmonary hypertension ([place])   . Restless legs     Past Surgical History:  Procedure Laterality  Date  . APPENDECTOMY  12/2000   [name] 12/22/2010  . BIV UPGRADE N/A 02/22/2018   Procedure: BIV UPGRADE;  Surgeon: [name], MD;  Location: [place] [facility];  Service: Cardiovascular;  Laterality: N/A;  . CARDIAC CATHETERIZATION  11/07/2008   nonischemic cardiomyopathy,pulmonary hypertension  . CATARACT EXTRACTION W/ INTRAOCULAR LENS  IMPLANT, BILATERAL Bilateral   . CIRCUMCISION  12/2005   [name] 12/22/2010  . COLOSTOMY  12/2000   [name] 12/22/2010  . COLOSTOMY REVERSAL  07/2001   [name] 12/22/2010  . CORONARY ANGIOPLASTY  06/01/1999   successful to ostium of the first diagonal  . ESOPHAGOGASTRODUODENOSCOPY N/A 08/22/2013   Procedure: ESOPHAGOGASTRODUODENOSCOPY (EGD);  Surgeon: [name], MD;  Location: [facility] ENDOSCOPY;  Service: Endoscopy;  Laterality: N/A;  h/p in file cabinet, jackie  . ESOPHAGOGASTRODUODENOSCOPY N/A 11/05/2014   Procedure: ESOPHAGOGASTRODUODENOSCOPY (EGD);  Surgeon: Clarene Essex, MD;  Location: [facility] ENDOSCOPY;  Service: Endoscopy;  Laterality: N/A;  . ESOPHAGOGASTRODUODENOSCOPY N/A 11/08/2016   Procedure: ESOPHAGOGASTRODUODENOSCOPY (EGD);  Surgeon: [name], MD;  Location: [facility] ENDOSCOPY;  Service: Endoscopy;  Laterality: N/A;  . ESOPHAGOGASTRODUODENOSCOPY (EGD) WITH PROPOFOL Left 11/05/2017   Procedure: ESOPHAGOGASTRODUODENOSCOPY (EGD) WITH PROPOFOL;  Surgeon: [name], MD;  Location: [place];  Service: Endoscopy;  Laterality: Left;  . HOT HEMOSTASIS N/A 11/05/2014   Procedure: HOT HEMOSTASIS (ARGON PLASMA COAGULATION/BICAP);  Surgeon: Clarene Essex, MD;  Location: [facility] ENDOSCOPY;  Service: Endoscopy;  Laterality: N/A;  .  INSERT / REPLACE / REMOVE PACEMAKER    . JOINT REPLACEMENT    . MASS EXCISION Left    hand w/ulnar artery reconstruction/notes 12/22/2010  . NM MYOCAR PERF WALL MOTION  11/24/2007   normal  . PERMANENT PACEMAKER INSERTION  10/04/2012   [employer]  . PERMANENT PACEMAKER INSERTION N/A 10/12/2011   Procedure: PERMANENT PACEMAKER  INSERTION;  Surgeon: [name], MD;  Location: [place] CATH LAB;  Service: Cardiovascular;  Laterality: N/A;  . REPLACEMENT TOTAL KNEE Bilateral 11/2006   right-left/notes 12/22/2010  . ROTATOR CUFF REPAIR Right 09/2004   [name] 12/22/2010  . SAVORY DILATION N/A 08/22/2013   Procedure: SAVORY DILATION;  Surgeon: [name], MD;  Location: [facility] ENDOSCOPY;  Service: Endoscopy;  Laterality: N/A;  . SHOULDER SURGERY Right    "fell off house; messed up 3 things in my arm"  . TONSILLECTOMY    . US ECHOCARDIOGRAPHY  02/01/2011   LA is mod-severely dilated,AOV & root sclerotic,ca+ AOV leaflets     Current Outpatient Medications  Medication Sig Dispense Refill  . acetaminophen (TYLENOL) 650 MG CR tablet Take 650 mg by mouth every 8 (eight) hours as needed for pain.    [name] allopurinol (ZYLOPRIM) 100 MG tablet Take 2 tablets (200 mg total) by mouth daily. 30 tablet 1  . Amino Acids-Protein Hydrolys (FEEDING SUPPLEMENT, PRO-STAT SUGAR FREE 64,) LIQD Take 30 mLs by mouth 2 (two) times daily. 900 mL 0  . Bisacodyl (LAXATIVE PO) Take 2 tablets by mouth 2 (two) times daily as needed (constipation).    . cetirizine (ZYRTEC) 10 MG tablet Take 10 mg by mouth every evening.    . colchicine 0.6 MG tablet Take 0.6 mg by mouth daily as needed (gout flare). Colcrys    . dutasteride (AVODART) 0.5 MG capsule Take 0.5 mg by mouth daily.    [name] escitalopram (LEXAPRO) 5 MG tablet Take 1 tablet (5 mg total) by mouth at bedtime. 30 tablet 0  . Ferrous Gluconate (IRON 27 PO) Take 1 tablet by mouth every evening.    . ferrous sulfate 325 (65 FE) MG tablet Take 1 tablet (325 mg total) by mouth 2 (two) times daily with a meal. 180 tablet 3  . furosemide (LASIX) 80 MG tablet Take 1 tablet (80 mg total) by mouth 2 (two) times daily. 180 tablet 1  . guaiFENesin (MUCINEX) 600 MG 12 hr tablet Take 1 tablet (600 mg total) by mouth 2 (two) times daily as needed for cough. 30 tablet 0  . hydrOXYzine (ATARAX/VISTARIL) 25 MG tablet TAKE 1  TABLET BY MOUTH EVERY 6 HOURS AS NEEDED FOR  ITCHING 90 tablet 2  . loratadine (CLARITIN) 10 MG tablet Take 10 mg by mouth daily.    . mirtazapine (REMERON) 15 MG tablet Take 15 mg by mouth at bedtime.    . Multiple Vitamin (MULTIVITAMIN WITH MINERALS) TABS tablet Take 1 tablet by mouth daily.    . Multiple Vitamins-Minerals (PRESERVISION AREDS 2) CAPS Take 1 capsule by mouth daily.    . OXYGEN Inhale 2 L into the lungs daily.    . pantoprazole (PROTONIX) 40 MG tablet Take 1 tablet (40 mg total) by mouth daily. (Patient taking differently: Take 40 mg by mouth 2 (two) times daily. ) 60 tablet 0  . potassium chloride (K-DUR) 10 MEQ tablet Take 1 tablet (10 mEq total) by mouth daily. 90 tablet 3  . rOPINIRole (REQUIP) 3 MG tablet Take 3 mg by mouth at bedtime.     [name]  Tamsulosin HCl (FLOMAX) 0.4 MG CAPS Take 0.4 mg by mouth daily after breakfast.    . traMADol (ULTRAM) 50 MG tablet Take 100 mg by mouth 2 (two) times daily. For pain     . Magnesium 200 MG TABS Take 1 tablet (200 mg total) by mouth daily. 30 each    No current facility-administered medications for this visit.     Allergies:   Vicodin [hydrocodone-acetaminophen]    Social History:  The patient  reports that he has quit smoking. He has never used smokeless tobacco. He reports that he does not drink alcohol or use drugs.   Family History:  The patient's family history includes Cancer in his mother; Diabetes in his brother; Heart attack in his father.    ROS: All other systems are reviewed and negative. Unless otherwise mentioned in H&P    PHYSICAL EXAM: VS:  BP (!) 128/58 (BP Location: Left Arm, Patient Position: Sitting)   Pulse 60   Ht 5\' 7"  (1.702 m)   Wt 185 lb 3.2 oz (84 kg)   SpO2 95%   BMI 29.01 kg/m  , BMI Body mass index is 29.01 kg/m. GEN: Well nourished, well developed, in no acute distress  HEENT: normal  Neck: no JVD, carotid bruits, or masses Cardiac: RRR; 2/6 systolic murmur, rubs, or gallops,no edema    Respiratory:  clear to auscultation bilaterally, normal work of breathing GI: soft, nontender, nondistended, + BS MS: no deformity or atrophy  Skin: warm and dry, no rash Neuro:  Strength and sensation are intact. Legs are moving and restless as he is sitting.  Psych: euthymic mood, full affect   EKG: Not completed on this office visit.   Recent Labs: 11/03/2017: TSH 3.099 01/12/2018: B Natriuretic Peptide 1,360.9; Magnesium 2.2 01/14/2018: ALT 104 02/21/2018: BUN 54; Creatinine, Ser 1.56; Hemoglobin 9.0; Platelets 147; Potassium 4.3; Sodium 139    Lipid Panel    Component Value Date/Time   CHOL 117 11/04/2017 0458   TRIG 61 11/04/2017 0458   HDL 41 11/04/2017 0458   CHOLHDL 2.9 11/04/2017 0458   VLDL 12 11/04/2017 0458   LDLCALC 64 11/04/2017 0458      Wt Readings from Last 3 Encounters:  03/08/18 185 lb 3.2 oz (84 kg)  02/22/18 (P) 180 lb (81.6 kg)  01/25/18 188 lb 3.2 oz (85.4 kg)      Other studies Reviewed: Echocardiogram 19-Jan-2018 Left ventricle: The cavity size was normal. Systolic function was   severely reduced. The estimated ejection fraction was in the   range of 25% to 30%. There is akinesis of the   mid-apicalanteroseptal and inferoseptal myocardium. The study is   not technically sufficient to allow evaluation of LV diastolic   function. - Aortic valve: Trileaflet; moderately thickened, moderately   calcified leaflets. Transvalvular velocity was minimally   increased. There was no stenosis. There was mild regurgitation.   Peak velocity (S): 233 cm/s. Valve area (VTI): 1.49 cm^2. Valve   area (Vmax): 1.6 cm^2. Valve area (Vmean): 1.44 cm^2. - Mitral valve: There was mild regurgitation. - Left atrium: The atrium was severely dilated. - Right ventricle: The cavity size was moderately dilated. Wall   thickness was normal. Pacer wire or catheter noted in right   ventricle. - Right atrium: The atrium was severely dilated. - Tricuspid valve: There was moderate  regurgitation. - Pulmonary arteries: Systolic pressure was moderately increased.   PA peak pressure: 53 mm Hg (S).  ASSESSMENT AND PLAN:  1.  NICM; EF  of 25%-30%. He was due for ICD upgrade per Dr. [name] but was cancelled due to RLS. He continues to await rescheduling. We have sent a message to Dr. Olin Pia nurse concerning this. In the interim he will continue his current regimen.   2. Chronic Systolic CHF: He does not have evidence of fluid overload at this time. He will continue lasix. He is not on BB due to COPD.   3. RLS: We have called his PCP for follow up appointment concerning this. I have added Magnesium 200 mg daily.   4. Deconditioning: He is to use a walker when ambulating and have someone with him to assist him while getting into bed to prevent him sitting too long and falling asleep, and falling.    Current medicines are reviewed at length with the patient today.    Labs/ tests ordered today include:  Phill Myron. [place], ANP, AACC   03/08/2018 2:44 PM    [facility] [address], [place], [place] [address] Phone: [phone]; Fax: [phone]

## 2018-03-08 ENCOUNTER — Encounter: Payer: Self-pay | Admitting: Adult Health

## 2018-03-08 ENCOUNTER — Ambulatory Visit (INDEPENDENT_AMBULATORY_CARE_PROVIDER_SITE_OTHER): Payer: Medicare Other | Admitting: Adult Health

## 2018-03-08 ENCOUNTER — Telehealth: Payer: Self-pay

## 2018-03-08 VITALS — BP 128/58 | HR 60 | Ht 67.0 in | Wt 185.2 lb

## 2018-03-08 DIAGNOSIS — I2510 Atherosclerotic heart disease of native coronary artery without angina pectoris: Secondary | ICD-10-CM | POA: Diagnosis not present

## 2018-03-08 DIAGNOSIS — I10 Essential (primary) hypertension: Secondary | ICD-10-CM

## 2018-03-08 DIAGNOSIS — I358 Other nonrheumatic aortic valve disorders: Secondary | ICD-10-CM

## 2018-03-08 DIAGNOSIS — Z950 Presence of cardiac pacemaker: Secondary | ICD-10-CM | POA: Diagnosis not present

## 2018-03-08 DIAGNOSIS — I43 Cardiomyopathy in diseases classified elsewhere: Secondary | ICD-10-CM

## 2018-03-08 DIAGNOSIS — I429 Cardiomyopathy, unspecified: Secondary | ICD-10-CM

## 2018-03-08 MED ORDER — MAGNESIUM 200 MG PO TABS: 200.0000 mg | ORAL_TABLET | Freq: Every day | ORAL | Status: DC

## 2018-03-08 NOTE — Telephone Encounter (Signed)
Pt is scheduled with anesthesia, per Dr Olin Pia hospital note, for August 21. Pt's wife aware they are to arrive at short stay at 6:30am that morning. Pt will be by the office on 8/12 for pre procedural labs. Instruction letter sent to pt via [employer].

## 2018-03-08 NOTE — Telephone Encounter (Signed)
-----   Message from Waylan Rocher, LPN sent at 1/44/8185  2:33 PM EDT ----- Regarding: RE-SCHEDULE APPT PT IS HERE FOR F/U APPT TODAY, PLEASE CALL PT TO RE-SCHEDULE PACEMAKER INSERTION. HIS WAS CANCELLED D/T RESTLESS LEG SYNDROME AND HE NEVER RECEIVED A CALL TO RE-SCHEDULE. [place]

## 2018-03-08 NOTE — Addendum Note (Signed)
Addended by: Dollene Primrose on: 03/08/2018 04:16 PM   Modules accepted: Orders

## 2018-03-08 NOTE — Patient Instructions (Signed)
Medication Instructions:  START MAGNESIUM 200MG  DAILY THIS IS OVER-THE-COUNTER  If you need a refill on your cardiac medications before your next appointment, please call your pharmacy.  Special Instructions: DR [name]'S OFFICE SHOULD BE CALLING TO SCHEDULE APPT-HER NAME IS CHARLENE.  DR Addison Lank OFFICE SHOULD BE CALLING TO SCHEDULE APPOINEMENT  Follow-Up: Your physician wants you to follow-up in: AFTER PACEMAKER PLACEMENT, CALL HERE AFTER SCHEDULING THE PACEMAKER INSERTION APPOINTMENT.   Thank you for choosing [facility]!!

## 2018-03-09 DIAGNOSIS — I2510 Atherosclerotic heart disease of native coronary artery without angina pectoris: Secondary | ICD-10-CM | POA: Diagnosis not present

## 2018-03-09 DIAGNOSIS — I130 Hypertensive heart and chronic kidney disease with heart failure and stage 1 through stage 4 chronic kidney disease, or unspecified chronic kidney disease: Secondary | ICD-10-CM | POA: Diagnosis not present

## 2018-03-09 DIAGNOSIS — N183 Chronic kidney disease, stage 3 (moderate): Secondary | ICD-10-CM | POA: Diagnosis not present

## 2018-03-09 DIAGNOSIS — I252 Old myocardial infarction: Secondary | ICD-10-CM | POA: Diagnosis not present

## 2018-03-09 DIAGNOSIS — I482 Chronic atrial fibrillation: Secondary | ICD-10-CM | POA: Diagnosis not present

## 2018-03-09 DIAGNOSIS — I5022 Chronic systolic (congestive) heart failure: Secondary | ICD-10-CM | POA: Diagnosis not present

## 2018-03-10 ENCOUNTER — Telehealth: Payer: Self-pay

## 2018-03-10 DIAGNOSIS — Z9981 Dependence on supplemental oxygen: Secondary | ICD-10-CM | POA: Diagnosis not present

## 2018-03-10 DIAGNOSIS — I480 Paroxysmal atrial fibrillation: Secondary | ICD-10-CM | POA: Diagnosis not present

## 2018-03-10 DIAGNOSIS — E7800 Pure hypercholesterolemia, unspecified: Secondary | ICD-10-CM | POA: Diagnosis not present

## 2018-03-10 DIAGNOSIS — R7301 Impaired fasting glucose: Secondary | ICD-10-CM | POA: Diagnosis not present

## 2018-03-10 DIAGNOSIS — Z950 Presence of cardiac pacemaker: Secondary | ICD-10-CM | POA: Diagnosis not present

## 2018-03-10 DIAGNOSIS — D508 Other iron deficiency anemias: Secondary | ICD-10-CM | POA: Diagnosis not present

## 2018-03-10 DIAGNOSIS — G4733 Obstructive sleep apnea (adult) (pediatric): Secondary | ICD-10-CM | POA: Diagnosis not present

## 2018-03-10 DIAGNOSIS — Z6829 Body mass index (BMI) 29.0-29.9, adult: Secondary | ICD-10-CM | POA: Diagnosis not present

## 2018-03-10 DIAGNOSIS — I10 Essential (primary) hypertension: Secondary | ICD-10-CM | POA: Diagnosis not present

## 2018-03-10 DIAGNOSIS — I208 Other forms of angina pectoris: Secondary | ICD-10-CM | POA: Diagnosis not present

## 2018-03-10 DIAGNOSIS — M109 Gout, unspecified: Secondary | ICD-10-CM | POA: Diagnosis not present

## 2018-03-10 DIAGNOSIS — D509 Iron deficiency anemia, unspecified: Secondary | ICD-10-CM | POA: Diagnosis not present

## 2018-03-10 DIAGNOSIS — G2581 Restless legs syndrome: Secondary | ICD-10-CM | POA: Diagnosis not present

## 2018-03-10 NOTE — Telephone Encounter (Signed)
LVM with rescheduled appointments for Wound Check and 91 day follow up with Dr [name]. Pt is to call back if he has any questions or needs to reschedule.

## 2018-03-13 ENCOUNTER — Other Ambulatory Visit: Payer: Self-pay

## 2018-03-15 DIAGNOSIS — N183 Chronic kidney disease, stage 3 (moderate): Secondary | ICD-10-CM | POA: Diagnosis not present

## 2018-03-15 DIAGNOSIS — I130 Hypertensive heart and chronic kidney disease with heart failure and stage 1 through stage 4 chronic kidney disease, or unspecified chronic kidney disease: Secondary | ICD-10-CM | POA: Diagnosis not present

## 2018-03-15 DIAGNOSIS — I2510 Atherosclerotic heart disease of native coronary artery without angina pectoris: Secondary | ICD-10-CM | POA: Diagnosis not present

## 2018-03-15 DIAGNOSIS — I252 Old myocardial infarction: Secondary | ICD-10-CM | POA: Diagnosis not present

## 2018-03-15 DIAGNOSIS — I5022 Chronic systolic (congestive) heart failure: Secondary | ICD-10-CM | POA: Diagnosis not present

## 2018-03-15 DIAGNOSIS — I482 Chronic atrial fibrillation: Secondary | ICD-10-CM | POA: Diagnosis not present

## 2018-03-20 ENCOUNTER — Other Ambulatory Visit: Payer: Self-pay | Admitting: *Deleted

## 2018-03-22 ENCOUNTER — Other Ambulatory Visit: Payer: Medicare Other | Admitting: *Deleted

## 2018-03-22 ENCOUNTER — Ambulatory Visit (INDEPENDENT_AMBULATORY_CARE_PROVIDER_SITE_OTHER): Payer: Medicare Other | Admitting: Podiatry

## 2018-03-22 DIAGNOSIS — I429 Cardiomyopathy, unspecified: Secondary | ICD-10-CM | POA: Diagnosis not present

## 2018-03-22 DIAGNOSIS — M79676 Pain in unspecified toe(s): Secondary | ICD-10-CM

## 2018-03-22 DIAGNOSIS — L989 Disorder of the skin and subcutaneous tissue, unspecified: Secondary | ICD-10-CM | POA: Diagnosis not present

## 2018-03-22 DIAGNOSIS — B351 Tinea unguium: Secondary | ICD-10-CM

## 2018-03-23 LAB — CBC
Hematocrit: 27.9 % — ABNORMAL LOW (ref 37.5–51.0)
Hemoglobin: 9.2 g/dL — ABNORMAL LOW (ref 13.0–17.7)
MCH: 36.4 pg — ABNORMAL HIGH (ref 26.6–33.0)
MCHC: 33 g/dL (ref 31.5–35.7)
MCV: 110 fL — AB (ref 79–97)
PLATELETS: 144 10*3/uL — AB (ref 150–450)
RBC: 2.53 x10E6/uL — AB (ref 4.14–5.80)
RDW: 15.8 % — ABNORMAL HIGH (ref 12.3–15.4)
WBC: 3.8 10*3/uL (ref 3.4–10.8)

## 2018-03-23 LAB — BASIC METABOLIC PANEL
BUN/Creatinine Ratio: 36 — ABNORMAL HIGH (ref 10–24)
BUN: 43 mg/dL — ABNORMAL HIGH (ref 8–27)
CO2: 30 mmol/L — AB (ref 20–29)
CREATININE: 1.18 mg/dL (ref 0.76–1.27)
Calcium: 9 mg/dL (ref 8.6–10.2)
Chloride: 99 mmol/L (ref 96–106)
GFR calc Af Amer: 65 mL/min/{1.73_m2} (ref >59)
GFR, EST NON AFRICAN AMERICAN: 56 mL/min/{1.73_m2} — AB (ref >59)
Glucose: 78 mg/dL (ref 65–99)
POTASSIUM: 4.3 mmol/L (ref 3.5–5.2)
SODIUM: 142 mmol/L (ref 134–144)

## 2018-03-24 NOTE — Progress Notes (Signed)
    Subjective: Patient is a 84 y.o. male presenting to the office today with a chief complaint of painful callus lesions to the bilateral feet that have been present for the past several weeks. Walking and bearing weight increases the pain. He has not done anything to for treatment at home.  Patient also complains of elongated, thickened nails that cause pain while ambulating in shoes. He is unable to trim his own nails. Patient presents today for further treatment and evaluation.  Past Medical History:  Diagnosis Date  . Arthritis    "feet" (09/30/2017)  . CHF (congestive heart failure) ([place])   . Chronic bronchitis ([place])   . CKD (chronic kidney disease), stage III ([place])    [name] 09/30/2017  . Coronary artery disease   . Diverticulitis   . GERD (gastroesophageal reflux disease)   . Gout   . History of blood transfusion    "blood loss" (09/30/2017)  . Hyperlipidemia   . Hypertension   . MI (myocardial infarction) ([place]) ~ 2000   "light one"  . Nonischemic cardiomyopathy (HCC)    mild  . OSA on CPAP   . Permanent atrial fibrillation (HCC)    on Eliquis  . Presence of permanent cardiac pacemaker   . Pulmonary hypertension ([place])   . Restless legs     Objective:  Physical Exam General: Alert and oriented x3 in no acute distress  Dermatology: Hyperkeratotic lesions present on the bilateral feet x 2. Pain on palpation with a central nucleated core noted. Skin is warm, dry and supple bilateral lower extremities. Negative for open lesions or macerations. Nails are tender, long, thickened and dystrophic with subungual debris, consistent with onychomycosis, 1-5 bilateral. No signs of infection noted.  Vascular: Palpable pedal pulses bilaterally. No edema or erythema noted. Capillary refill within normal limits.  Neurological: Epicritic and protective threshold grossly intact bilaterally.   Musculoskeletal Exam: Pain on palpation at the keratotic lesion noted. Range of motion within  normal limits bilateral. Muscle strength 5/5 in all groups bilateral.  Assessment: 1. Onychodystrophic nails 1-5 bilateral with hyperkeratosis of nails.  2. Onychomycosis of nail due to dermatophyte bilateral 3. Pre-ulcerative callus lesions x 2 noted to the bilateral feet   Plan of Care:  1. Patient evaluated. 2. Excisional debridement of keratoic lesion using a chisel blade was performed without incident.  3. Dressed with light dressing. 4. Mechanical debridement of nails 1-5 bilaterally performed using a nail nipper. Filed with dremel without incident.  5. Patient is to return to the clinic in 3 months.   Edrick Kins, DPM [facility]  Dr. Edrick Kins, [address]                                        [place], [place] [address]                Office [phone]  Fax [phone]

## 2018-03-29 ENCOUNTER — Encounter (HOSPITAL_COMMUNITY)
Admission: RE | Disposition: A | Discharge status: Home / Self Care | Payer: Self-pay | Source: Ambulatory Visit | Attending: Internal Medicine

## 2018-03-29 ENCOUNTER — Ambulatory Visit (HOSPITAL_COMMUNITY): Payer: Medicare Other | Admitting: Certified Registered"

## 2018-03-29 ENCOUNTER — Encounter (HOSPITAL_COMMUNITY): Payer: Self-pay | Admitting: Certified Registered"

## 2018-03-29 ENCOUNTER — Other Ambulatory Visit: Payer: Self-pay

## 2018-03-29 ENCOUNTER — Ambulatory Visit (HOSPITAL_COMMUNITY)
Admission: RE | Admit: 2018-03-29 | Discharge: 2018-03-30 | Disposition: A | Discharge status: Home / Self Care | Payer: Medicare Other | Source: Ambulatory Visit | Attending: Internal Medicine | Admitting: Internal Medicine

## 2018-03-29 DIAGNOSIS — I252 Old myocardial infarction: Secondary | ICD-10-CM | POA: Insufficient documentation

## 2018-03-29 DIAGNOSIS — I2510 Atherosclerotic heart disease of native coronary artery without angina pectoris: Secondary | ICD-10-CM | POA: Insufficient documentation

## 2018-03-29 DIAGNOSIS — Z885 Allergy status to narcotic agent status: Secondary | ICD-10-CM | POA: Insufficient documentation

## 2018-03-29 DIAGNOSIS — Z933 Colostomy status: Secondary | ICD-10-CM | POA: Diagnosis not present

## 2018-03-29 DIAGNOSIS — I429 Cardiomyopathy, unspecified: Secondary | ICD-10-CM | POA: Insufficient documentation

## 2018-03-29 DIAGNOSIS — I130 Hypertensive heart and chronic kidney disease with heart failure and stage 1 through stage 4 chronic kidney disease, or unspecified chronic kidney disease: Secondary | ICD-10-CM | POA: Diagnosis not present

## 2018-03-29 DIAGNOSIS — E785 Hyperlipidemia, unspecified: Secondary | ICD-10-CM | POA: Diagnosis not present

## 2018-03-29 DIAGNOSIS — J449 Chronic obstructive pulmonary disease, unspecified: Secondary | ICD-10-CM | POA: Diagnosis not present

## 2018-03-29 DIAGNOSIS — M109 Gout, unspecified: Secondary | ICD-10-CM | POA: Insufficient documentation

## 2018-03-29 DIAGNOSIS — I5022 Chronic systolic (congestive) heart failure: Secondary | ICD-10-CM | POA: Diagnosis present

## 2018-03-29 DIAGNOSIS — Z9889 Other specified postprocedural states: Secondary | ICD-10-CM | POA: Insufficient documentation

## 2018-03-29 DIAGNOSIS — I482 Chronic atrial fibrillation, unspecified: Secondary | ICD-10-CM | POA: Diagnosis present

## 2018-03-29 DIAGNOSIS — N400 Enlarged prostate without lower urinary tract symptoms: Secondary | ICD-10-CM | POA: Diagnosis not present

## 2018-03-29 DIAGNOSIS — I428 Other cardiomyopathies: Secondary | ICD-10-CM

## 2018-03-29 DIAGNOSIS — R001 Bradycardia, unspecified: Secondary | ICD-10-CM | POA: Insufficient documentation

## 2018-03-29 DIAGNOSIS — Z9981 Dependence on supplemental oxygen: Secondary | ICD-10-CM | POA: Insufficient documentation

## 2018-03-29 DIAGNOSIS — G4730 Sleep apnea, unspecified: Secondary | ICD-10-CM | POA: Diagnosis not present

## 2018-03-29 DIAGNOSIS — G2581 Restless legs syndrome: Secondary | ICD-10-CM | POA: Insufficient documentation

## 2018-03-29 DIAGNOSIS — N183 Chronic kidney disease, stage 3 unspecified: Secondary | ICD-10-CM | POA: Diagnosis present

## 2018-03-29 DIAGNOSIS — K219 Gastro-esophageal reflux disease without esophagitis: Secondary | ICD-10-CM | POA: Diagnosis not present

## 2018-03-29 DIAGNOSIS — Z9841 Cataract extraction status, right eye: Secondary | ICD-10-CM | POA: Insufficient documentation

## 2018-03-29 DIAGNOSIS — Z9842 Cataract extraction status, left eye: Secondary | ICD-10-CM | POA: Insufficient documentation

## 2018-03-29 DIAGNOSIS — G4733 Obstructive sleep apnea (adult) (pediatric): Secondary | ICD-10-CM | POA: Insufficient documentation

## 2018-03-29 DIAGNOSIS — R0609 Other forms of dyspnea: Secondary | ICD-10-CM

## 2018-03-29 DIAGNOSIS — Z959 Presence of cardiac and vascular implant and graft, unspecified: Secondary | ICD-10-CM

## 2018-03-29 DIAGNOSIS — E669 Obesity, unspecified: Secondary | ICD-10-CM | POA: Diagnosis not present

## 2018-03-29 DIAGNOSIS — D649 Anemia, unspecified: Secondary | ICD-10-CM | POA: Diagnosis not present

## 2018-03-29 DIAGNOSIS — Z79899 Other long term (current) drug therapy: Secondary | ICD-10-CM | POA: Insufficient documentation

## 2018-03-29 DIAGNOSIS — Z8249 Family history of ischemic heart disease and other diseases of the circulatory system: Secondary | ICD-10-CM | POA: Insufficient documentation

## 2018-03-29 DIAGNOSIS — Z950 Presence of cardiac pacemaker: Secondary | ICD-10-CM

## 2018-03-29 DIAGNOSIS — I509 Heart failure, unspecified: Secondary | ICD-10-CM | POA: Diagnosis not present

## 2018-03-29 DIAGNOSIS — I10 Essential (primary) hypertension: Secondary | ICD-10-CM | POA: Diagnosis not present

## 2018-03-29 DIAGNOSIS — Z87891 Personal history of nicotine dependence: Secondary | ICD-10-CM | POA: Insufficient documentation

## 2018-03-29 DIAGNOSIS — Z955 Presence of coronary angioplasty implant and graft: Secondary | ICD-10-CM | POA: Insufficient documentation

## 2018-03-29 HISTORY — PX: BIV UPGRADE: EP1202

## 2018-03-29 HISTORY — DX: Presence of cardiac pacemaker: Z95.0

## 2018-03-29 LAB — GLUCOSE, CAPILLARY: GLUCOSE-CAPILLARY: 111 mg/dL — AB (ref 70–99)

## 2018-03-29 LAB — SURGICAL PCR SCREEN
MRSA, PCR: NEGATIVE
Staphylococcus aureus: POSITIVE — AB

## 2018-03-29 SURGERY — BIV UPGRADE
Anesthesia: General

## 2018-03-29 MED ORDER — HYDROMORPHONE HCL 1 MG/ML IJ SOLN: 0.2500 mg | INTRAMUSCULAR | Status: DC | PRN

## 2018-03-29 MED ORDER — IOPAMIDOL (ISOVUE-370) INJECTION 76%
INTRAVENOUS | Status: DC | PRN
  Administered 2018-03-29: 10 mL via INTRAVENOUS

## 2018-03-29 MED ORDER — FUROSEMIDE 80 MG PO TABS
80.0000 mg | ORAL_TABLET | Freq: Two times a day (BID) | ORAL | Status: DC
  Administered 2018-03-29 – 2018-03-30 (×2): 80 mg via ORAL
  Filled 2018-03-29 (×2): qty 1

## 2018-03-29 MED ORDER — PANTOPRAZOLE SODIUM 40 MG PO TBEC
40.0000 mg | DELAYED_RELEASE_TABLET | Freq: Every day | ORAL | Status: DC
  Administered 2018-03-30: 40 mg via ORAL
  Filled 2018-03-29: qty 1

## 2018-03-29 MED ORDER — LIDOCAINE HCL 1 % IJ SOLN
INTRAMUSCULAR | Status: AC
  Filled 2018-03-29: qty 60

## 2018-03-29 MED ORDER — SODIUM CHLORIDE 0.9 % IV SOLN
INTRAVENOUS | Status: DC
  Administered 2018-03-29: 08:00:00 via INTRAVENOUS

## 2018-03-29 MED ORDER — FENTANYL CITRATE (PF) 100 MCG/2ML IJ SOLN
INTRAMUSCULAR | Status: DC | PRN
  Administered 2018-03-29 (×2): 50 ug via INTRAVENOUS

## 2018-03-29 MED ORDER — COLCHICINE 0.6 MG PO TABS: 0.6000 mg | ORAL_TABLET | Freq: Every day | ORAL | Status: DC | PRN

## 2018-03-29 MED ORDER — HEPARIN (PORCINE) IN NACL 1000-0.9 UT/500ML-% IV SOLN
INTRAVENOUS | Status: AC
  Filled 2018-03-29: qty 500

## 2018-03-29 MED ORDER — ADULT MULTIVITAMIN W/MINERALS CH
1.0000 | ORAL_TABLET | Freq: Every day | ORAL | Status: DC
  Administered 2018-03-30: 1 via ORAL
  Filled 2018-03-29: qty 1

## 2018-03-29 MED ORDER — ONDANSETRON HCL 4 MG/2ML IJ SOLN: 4.0000 mg | Freq: Four times a day (QID) | INTRAMUSCULAR | Status: DC | PRN

## 2018-03-29 MED ORDER — ALLOPURINOL 100 MG PO TABS
200.0000 mg | ORAL_TABLET | Freq: Every day | ORAL | Status: DC
  Administered 2018-03-30: 200 mg via ORAL
  Filled 2018-03-29: qty 2

## 2018-03-29 MED ORDER — ROPINIROLE HCL 1 MG PO TABS
3.0000 mg | ORAL_TABLET | Freq: Every day | ORAL | Status: DC
  Administered 2018-03-29: 3 mg via ORAL
  Filled 2018-03-29: qty 3

## 2018-03-29 MED ORDER — MEPERIDINE HCL 25 MG/ML IJ SOLN: 6.2500 mg | INTRAMUSCULAR | Status: DC | PRN

## 2018-03-29 MED ORDER — SODIUM CHLORIDE 0.9 % IV SOLN
INTRAVENOUS | Status: AC
  Administered 2018-03-29: 16:00:00 via INTRAVENOUS

## 2018-03-29 MED ORDER — MIRTAZAPINE 15 MG PO TABS
15.0000 mg | ORAL_TABLET | Freq: Every day | ORAL | Status: DC
  Administered 2018-03-29: 15 mg via ORAL
  Filled 2018-03-29: qty 1

## 2018-03-29 MED ORDER — FUROSEMIDE 80 MG PO TABS: 80.0000 mg | ORAL_TABLET | Freq: Two times a day (BID) | ORAL | Status: DC

## 2018-03-29 MED ORDER — LIDOCAINE 2% (20 MG/ML) 5 ML SYRINGE
INTRAMUSCULAR | Status: DC | PRN
  Administered 2018-03-29: 100 mg via INTRAVENOUS

## 2018-03-29 MED ORDER — MUPIROCIN 2 % EX OINT
TOPICAL_OINTMENT | Freq: Once | CUTANEOUS | Status: DC
  Filled 2018-03-29: qty 22

## 2018-03-29 MED ORDER — IOPAMIDOL (ISOVUE-370) INJECTION 76%
INTRAVENOUS | Status: AC
  Filled 2018-03-29: qty 50

## 2018-03-29 MED ORDER — GUAIFENESIN ER 600 MG PO TB12: 600.0000 mg | ORAL_TABLET | Freq: Two times a day (BID) | ORAL | Status: DC | PRN

## 2018-03-29 MED ORDER — SODIUM CHLORIDE 0.9 % IV SOLN
80.0000 mg | INTRAVENOUS | Status: AC
  Administered 2018-03-29: 80 mg

## 2018-03-29 MED ORDER — CHLORHEXIDINE GLUCONATE 4 % EX LIQD: 60.0000 mL | Freq: Once | CUTANEOUS | Status: DC

## 2018-03-29 MED ORDER — ESCITALOPRAM OXALATE 5 MG PO TABS
5.0000 mg | ORAL_TABLET | Freq: Every day | ORAL | Status: DC
  Administered 2018-03-29: 5 mg via ORAL
  Filled 2018-03-29: qty 1

## 2018-03-29 MED ORDER — LORATADINE 10 MG PO TABS
10.0000 mg | ORAL_TABLET | Freq: Every day | ORAL | Status: DC
  Administered 2018-03-30: 10 mg via ORAL
  Filled 2018-03-29: qty 1

## 2018-03-29 MED ORDER — POTASSIUM CHLORIDE CRYS ER 20 MEQ PO TBCR
20.0000 meq | EXTENDED_RELEASE_TABLET | Freq: Every day | ORAL | Status: DC
  Administered 2018-03-30: 20 meq via ORAL
  Filled 2018-03-29: qty 1

## 2018-03-29 MED ORDER — SUGAMMADEX SODIUM 200 MG/2ML IV SOLN
INTRAVENOUS | Status: DC | PRN
  Administered 2018-03-29: 100 mg via INTRAVENOUS

## 2018-03-29 MED ORDER — ONDANSETRON HCL 4 MG/2ML IJ SOLN: 4.0000 mg | Freq: Once | INTRAMUSCULAR | Status: DC | PRN

## 2018-03-29 MED ORDER — CEFAZOLIN SODIUM-DEXTROSE 1-4 GM/50ML-% IV SOLN
1.0000 g | Freq: Four times a day (QID) | INTRAVENOUS | Status: AC
  Administered 2018-03-29 – 2018-03-30 (×3): 1 g via INTRAVENOUS
  Filled 2018-03-29 (×3): qty 50

## 2018-03-29 MED ORDER — CEFAZOLIN SODIUM-DEXTROSE 2-4 GM/100ML-% IV SOLN
INTRAVENOUS | Status: AC
  Filled 2018-03-29: qty 100

## 2018-03-29 MED ORDER — DUTASTERIDE 0.5 MG PO CAPS
0.5000 mg | ORAL_CAPSULE | Freq: Every day | ORAL | Status: DC
  Administered 2018-03-30: 0.5 mg via ORAL
  Filled 2018-03-29: qty 1

## 2018-03-29 MED ORDER — MAGNESIUM OXIDE 400 (241.3 MG) MG PO TABS
200.0000 mg | ORAL_TABLET | Freq: Every day | ORAL | Status: DC
  Administered 2018-03-30: 200 mg via ORAL
  Filled 2018-03-29 (×2): qty 1

## 2018-03-29 MED ORDER — TRAMADOL HCL 50 MG PO TABS
100.0000 mg | ORAL_TABLET | Freq: Two times a day (BID) | ORAL | Status: DC | PRN
  Administered 2018-03-30: 100 mg via ORAL
  Filled 2018-03-29: qty 2

## 2018-03-29 MED ORDER — TAMSULOSIN HCL 0.4 MG PO CAPS
0.4000 mg | ORAL_CAPSULE | Freq: Every day | ORAL | Status: DC
  Administered 2018-03-30: 0.4 mg via ORAL
  Filled 2018-03-29: qty 1

## 2018-03-29 MED ORDER — ACETAMINOPHEN 325 MG PO TABS: 325.0000 mg | ORAL_TABLET | ORAL | Status: DC | PRN

## 2018-03-29 MED ORDER — PROSIGHT PO TABS
1.0000 | ORAL_TABLET | Freq: Every day | ORAL | Status: DC
  Administered 2018-03-30: 1 via ORAL
  Filled 2018-03-29: qty 1

## 2018-03-29 MED ORDER — ONDANSETRON HCL 4 MG/2ML IJ SOLN
INTRAMUSCULAR | Status: DC | PRN
  Administered 2018-03-29: 4 mg via INTRAVENOUS

## 2018-03-29 MED ORDER — HEPARIN (PORCINE) IN NACL 2000-0.9 UNIT/L-% IV SOLN
INTRAVENOUS | Status: DC | PRN
  Administered 2018-03-29: 500 mL

## 2018-03-29 MED ORDER — LIDOCAINE HCL (PF) 1 % IJ SOLN
INTRAMUSCULAR | Status: DC | PRN
  Administered 2018-03-29: 45 mL

## 2018-03-29 MED ORDER — PROPOFOL 10 MG/ML IV BOLUS
INTRAVENOUS | Status: DC | PRN
  Administered 2018-03-29: 80 mg via INTRAVENOUS

## 2018-03-29 MED ORDER — SODIUM CHLORIDE 0.9 % IV SOLN
INTRAVENOUS | Status: AC
  Filled 2018-03-29: qty 2

## 2018-03-29 MED ORDER — MIDAZOLAM HCL 5 MG/5ML IJ SOLN
INTRAMUSCULAR | Status: DC | PRN
  Administered 2018-03-29 (×2): 1 mg via INTRAVENOUS

## 2018-03-29 MED ORDER — ROCURONIUM BROMIDE 10 MG/ML (PF) SYRINGE
PREFILLED_SYRINGE | INTRAVENOUS | Status: DC | PRN
  Administered 2018-03-29: 50 mg via INTRAVENOUS

## 2018-03-29 MED ORDER — DEXAMETHASONE SODIUM PHOSPHATE 10 MG/ML IJ SOLN
INTRAMUSCULAR | Status: DC | PRN
  Administered 2018-03-29: 10 mg via INTRAVENOUS

## 2018-03-29 MED ORDER — CEFAZOLIN SODIUM-DEXTROSE 2-4 GM/100ML-% IV SOLN
2.0000 g | INTRAVENOUS | Status: AC
  Administered 2018-03-29: 2 g via INTRAVENOUS

## 2018-03-29 SURGICAL SUPPLY — 14 items
BALLN COR SINUS VENO 6F 80CM (BALLOONS) ×1
BALLN COR SINUS VENO 6FR 80 (BALLOONS) ×2
BALLOON COR SINUS VENO 6FR 80 (BALLOONS) IMPLANT
CABLE SURGICAL S-101-97-12 (CABLE) ×3 IMPLANT
CATH CPS DIRECT 135 DS2C020 (CATHETERS) ×2 IMPLANT
HEMOSTAT SURGICEL 2X4 FIBR (HEMOSTASIS) ×2 IMPLANT
LEAD ATTAIN ABILITY 4396-88CM (Lead) ×2 IMPLANT
PACEMAKER ASSURITY DR-RF (Pacemaker) ×2 IMPLANT
PAD PRO RADIOLUCENT 2001M-C (PAD) ×3 IMPLANT
SHEATH CLASSIC 8F (SHEATH) ×2 IMPLANT
SHEATH CLASSIC 9.5F (SHEATH) ×2 IMPLANT
TRAY PACEMAKER INSERTION (PACKS) ×3 IMPLANT
WIRE ACUITY WHISPER EDS 4648 (WIRE) ×2 IMPLANT
WIRE HI TORQ VERSACORE-J 145CM (WIRE) ×2 IMPLANT

## 2018-03-29 NOTE — H&P (Signed)
[name] is a 84 y.o. male Admitted for CRT-P upgrade  Nonischemic cardiomyopathy w rapidly declining EF and hospitalizations for CHF Vpacing percentage about 70%  O2 dependent COPD     TEST EF   2010 LHC  Normal CAs  2/16 Echo 55-60%   3/18 Echo 55-60% Severe LAE  2/19 Echo 40-45%   6/19 Echo 20-25%   8/19      Currently not anticoagulated 2/2 ongoing bleeding  He has pancytopenia and angiodysplastic lesions in prox GI track   Date Cr K Hgb  6/19 1.13 3.6 8.4  7/19 1.56 4.3 9.0  8/19 1.18 4.3 9.2    Stable sob and edema; no chest pain   Records and Results Reviewed       Past Medical History:  Diagnosis Date  . Arthritis    "feet" (09/30/2017)  . CHF (congestive heart failure) ([place])   . Chronic bronchitis ([place])   . CKD (chronic kidney disease), stage III ([place])    [name] 09/30/2017  . Coronary artery disease   . Diverticulitis   . GERD (gastroesophageal reflux disease)   . Gout   . History of blood transfusion    "blood loss" (09/30/2017)  . Hyperlipidemia   . Hypertension   . MI (myocardial infarction) ([place]) ~ 2000   "light one"  . Nonischemic cardiomyopathy (HCC)    mild  . OSA on CPAP   . Permanent atrial fibrillation (HCC)    on Eliquis  . Presence of permanent cardiac pacemaker   . Pulmonary hypertension ([place])   . Restless legs          Past Surgical History:  Procedure Laterality Date  . APPENDECTOMY  12/2000   [name] 12/22/2010  . CARDIAC CATHETERIZATION  11/07/2008   nonischemic cardiomyopathy,pulmonary hypertension  . CATARACT EXTRACTION W/ INTRAOCULAR LENS  IMPLANT, BILATERAL Bilateral   . CIRCUMCISION  12/2005   [name] 12/22/2010  . COLOSTOMY  12/2000   [name] 12/22/2010  . COLOSTOMY REVERSAL  07/2001   [name] 12/22/2010  . CORONARY ANGIOPLASTY  06/01/1999   successful to ostium of the first diagonal  . ESOPHAGOGASTRODUODENOSCOPY N/A 08/22/2013   Procedure:  ESOPHAGOGASTRODUODENOSCOPY (EGD);  Surgeon: [name], MD;  Location: [facility] ENDOSCOPY;  Service: Endoscopy;  Laterality: N/A;  h/p in file cabinet, jackie  . ESOPHAGOGASTRODUODENOSCOPY N/A 11/05/2014   Procedure: ESOPHAGOGASTRODUODENOSCOPY (EGD);  Surgeon: Clarene Essex, MD;  Location: [facility] ENDOSCOPY;  Service: Endoscopy;  Laterality: N/A;  . ESOPHAGOGASTRODUODENOSCOPY N/A 11/08/2016   Procedure: ESOPHAGOGASTRODUODENOSCOPY (EGD);  Surgeon: [name], MD;  Location: [facility] ENDOSCOPY;  Service: Endoscopy;  Laterality: N/A;  . ESOPHAGOGASTRODUODENOSCOPY (EGD) WITH PROPOFOL Left 11/05/2017   Procedure: ESOPHAGOGASTRODUODENOSCOPY (EGD) WITH PROPOFOL;  Surgeon: [name], MD;  Location: [place];  Service: Endoscopy;  Laterality: Left;  . HOT HEMOSTASIS N/A 11/05/2014   Procedure: HOT HEMOSTASIS (ARGON PLASMA COAGULATION/BICAP);  Surgeon: Clarene Essex, MD;  Location: [facility] ENDOSCOPY;  Service: Endoscopy;  Laterality: N/A;  . INSERT / REPLACE / REMOVE PACEMAKER    . JOINT REPLACEMENT    . MASS EXCISION Left    hand w/ulnar artery reconstruction/notes 12/22/2010  . NM MYOCAR PERF WALL MOTION  11/24/2007   normal  . PERMANENT PACEMAKER INSERTION  10/04/2012   [employer]  . PERMANENT PACEMAKER INSERTION N/A 10/12/2011   Procedure: PERMANENT PACEMAKER INSERTION;  Surgeon: Sanda , MD;  Location: [place] CATH LAB;  Service: Cardiovascular;  Laterality: N/A;  . REPLACEMENT TOTAL KNEE Bilateral 11/2006   right-left/notes 12/22/2010  . ROTATOR  CUFF REPAIR Right 09/2004   [name] 12/22/2010  . SAVORY DILATION N/A 08/22/2013   Procedure: SAVORY DILATION;  Surgeon: [name], MD;  Location: [facility] ENDOSCOPY;  Service: Endoscopy;  Laterality: N/A;  . SHOULDER SURGERY Right    "fell off house; messed up 3 things in my arm"  . TONSILLECTOMY    . US ECHOCARDIOGRAPHY  02/01/2011   LA is mod-severely dilated,AOV & root sclerotic,ca+ AOV leaflets             Current Facility-Administered  Medications  Medication Dose Route Frequency Provider Last Rate Last Dose  . mupirocin ointment (BACTROBAN) 2 %                 Allergies  Allergen Reactions  . Vicodin [Hydrocodone-Acetaminophen] Itching and Nausea Only      Social History       Tobacco Use  . Smoking status: Former Research scientist (life sciences)  . Smokeless tobacco: Never Used  . Tobacco comment: 09/30/2017 "it's been over 39yr since I smoked anything"  Substance Use Topics  . Alcohol use: No  . Drug use: No          Family History  Problem Relation Age of Onset  . Cancer Mother   . Heart attack Father   . Diabetes Brother      ActiveMedications      Current Meds  Medication Sig  . acetaminophen (TYLENOL) 650 MG CR tablet Take 650 mg by mouth every 8 (eight) hours as needed for pain.  [name] allopurinol (ZYLOPRIM) 100 MG tablet Take 2 tablets (200 mg total) by mouth daily.  . Amino Acids-Protein Hydrolys (FEEDING SUPPLEMENT, PRO-STAT SUGAR FREE 64,) LIQD Take 30 mLs by mouth 2 (two) times daily.  . Bisacodyl (LAXATIVE PO) Take 2 tablets by mouth 2 (two) times daily as needed (constipation).  . cetirizine (ZYRTEC) 10 MG tablet Take 10 mg by mouth every evening.  . colchicine 0.6 MG tablet Take 0.6 mg by mouth daily as needed (gout flare). Colcrys  . dutasteride (AVODART) 0.5 MG capsule Take 0.5 mg by mouth daily.  [name] escitalopram (LEXAPRO) 5 MG tablet Take 1 tablet (5 mg total) by mouth at bedtime.  . Ferrous Gluconate (IRON 27 PO) Take 1 tablet by mouth every evening.  . ferrous sulfate 325 (65 FE) MG tablet Take 1 tablet (325 mg total) by mouth 2 (two) times daily with a meal.  . furosemide (LASIX) 80 MG tablet Take 1 tablet (80 mg total) by mouth 2 (two) times daily.  [name] guaiFENesin (MUCINEX) 600 MG 12 hr tablet Take 1 tablet (600 mg total) by mouth 2 (two) times daily as needed for cough.  . hydrOXYzine (ATARAX/VISTARIL) 25 MG tablet TAKE 1 TABLET BY MOUTH EVERY 6 HOURS AS NEEDED FOR  ITCHING  . loratadine  (CLARITIN) 10 MG tablet Take 10 mg by mouth daily.  . mirtazapine (REMERON) 15 MG tablet Take 15 mg by mouth at bedtime.  . Multiple Vitamin (MULTIVITAMIN WITH MINERALS) TABS tablet Take 1 tablet by mouth daily.  . Multiple Vitamins-Minerals (PRESERVISION AREDS 2) CAPS Take 1 capsule by mouth daily.  . OXYGEN Inhale 2 L into the lungs daily.  . pantoprazole (PROTONIX) 40 MG tablet Take 1 tablet (40 mg total) by mouth daily. (Patient taking differently: Take 40 mg by mouth 2 (two) times daily. )  . potassium chloride (K-DUR) 10 MEQ tablet Take 1 tablet (10 mEq total) by mouth daily.  [name] rOPINIRole (REQUIP) 3 MG tablet Take 3 mg by  mouth at bedtime.   . Tamsulosin HCl (FLOMAX) 0.4 MG CAPS Take 0.4 mg by mouth daily after breakfast.  . traMADol (ULTRAM) 50 MG tablet Take 100 mg by mouth 2 (two) times daily. For pain        Review of Systems negative except from HPI and PMH  Physical Exam Pulse (!) 59   Temp 98.4 F (36.9 C) (Oral)   Resp 18   Ht 5\' 7"  (1.702 m)   Wt 81.6 kg   SpO2 100%   BMI 28.19 kg/m  Well developed and nourished in no acute distress wearing O2 HENT normal Neck supple with JVP-flat Clear but decreased  kyphotic Regular rate and rhythm, 2/6 sys m Abd-soft with active BS No Clubbing cyanosis tr edema Skin-warm and dry A & Oriented  Grossly normal sensory and motor function      Assessment and  Plan  Atrial fibrillation-permanent  Bradycardia   Pacemaker-[employer] with V pacing percentage greater than 70  Cardiomyopathy-progressive presumed nonischemic  Congestive heart failure-chronic-systolic grade 0R-6J  Pancytopenia   Renal Insufficiency g 3   Falls-recurrent    Renal function somewhat improved  We have reviewed the benefits and risks of generator replacement.  These include but are not limited to lead fracture and infection.  The patient understands, agrees and is willing to proceed.    Anesthesia 2/2 restless legs--  previously not able to stay comfortably on the table

## 2018-03-29 NOTE — Progress Notes (Signed)
RT NOTE:  RT came by to put patient on CPAP. Pt stated earlier he does wear CPAP @ home. Pt was asleep when RT arrived. Pt woke up and refused CPAP stating he would wear [place] until he was ready and he would put CPAP later if he felt like he wanted to wear. Pt understands RT is available if needed.

## 2018-03-29 NOTE — Anesthesia Procedure Notes (Signed)
Procedure Name: Intubation Date/Time: 03/29/2018 9:12 AM Performed by: Moshe Salisbury, CRNA Pre-anesthesia Checklist: Patient identified, Emergency Drugs available, Suction available and Patient being monitored Patient Re-evaluated:Patient Re-evaluated prior to induction Oxygen Delivery Method: Circle System Utilized Preoxygenation: Pre-oxygenation with 100% oxygen Induction Type: IV induction Ventilation: Mask ventilation without difficulty Laryngoscope Size: Mac and 4 Grade View: Grade I Tube type: Oral Tube size: 8.0 mm Number of attempts: 1 Airway Equipment and Method: Stylet Placement Confirmation: ETT inserted through vocal cords under direct vision,  positive ETCO2 and breath sounds checked- equal and bilateral Secured at: 22 cm Tube secured with: Tape Dental Injury: Teeth and Oropharynx as per pre-operative assessment

## 2018-03-29 NOTE — Transfer of Care (Signed)
Immediate Anesthesia Transfer of Care Note  Patient: [name]  Procedure(s) Performed: BIV PPM UPGRADE (N/A )  Patient Location: Cath Lab  Anesthesia Type:General  Level of Consciousness: awake  Airway & Oxygen Therapy: Patient Spontanous Breathing and Patient connected to nasal cannula oxygen  Post-op Assessment: Report given to RN, Post -op Vital signs reviewed and stable and Patient moving all extremities  Post vital signs: Reviewed and stable  Last Vitals:  Vitals Value Taken Time  BP 112/57 03/29/2018 11:20 AM  Temp 36.4 C 03/29/2018 11:11 AM  Pulse 73 03/29/2018 11:23 AM  Resp 17 03/29/2018 11:23 AM  SpO2 95 % 03/29/2018 11:23 AM  Vitals shown include unvalidated device data.  Last Pain:  Vitals:   03/29/18 1111  TempSrc: Tympanic  PainSc: 0-No pain         Complications: No apparent anesthesia complications

## 2018-03-29 NOTE — Anesthesia Postprocedure Evaluation (Signed)
Anesthesia Post Note  Patient: [name]  Procedure(s) Performed: BIV PPM UPGRADE (N/A )     Patient location during evaluation: PACU Anesthesia Type: General Level of consciousness: awake and alert Pain management: pain level controlled Vital Signs Assessment: post-procedure vital signs reviewed and stable Respiratory status: spontaneous breathing, nonlabored ventilation, respiratory function stable and patient connected to nasal cannula oxygen Cardiovascular status: blood pressure returned to baseline and stable Postop Assessment: no apparent nausea or vomiting Anesthetic complications: no    Last Vitals:  Vitals:   03/29/18 1432 03/29/18 1502  BP: 123/66 108/62  Pulse: 73 70  Resp:    Temp:    SpO2: 100% 100%    Last Pain:  Vitals:   03/29/18 1350  TempSrc: Oral  PainSc:                  Jermery Caratachea DAVID

## 2018-03-29 NOTE — Progress Notes (Signed)
Relieved prior nurse of care duties. Patient began snoring, CO2 increased to 62, O2 sats waxing and waning into mid 80s to 90s. Adjusted patient in bed, increased O2 to 4L. SPO2 increased to 95%. Patient's wife states he usually sleeps with a CPAP every night at home. Notified respiratory to bring CPAP. They're in room applying CPAP at this time. Vitals are stable.

## 2018-03-29 NOTE — Progress Notes (Signed)
Patient got here from cath lab very deeply asleep. He is not arousable, o2 saturation was in the 80's on roon air, respiratory called and orders are put in for a cpap. Pt still sleeping with 100% o2 sat on the cpap. Will continue to monitor.

## 2018-03-29 NOTE — Discharge Instructions (Signed)
° ° °  Supplemental Discharge Instructions for  Pacemaker/Defibrillator Patients  Activity No heavy lifting or vigorous activity with your left/right arm for 6 to 8 weeks.  Do not raise your left/right arm above your head for one week.  Gradually raise your affected arm as drawn below.           __      04/02/18                      04/03/18                     04/04/18                   04/05/18  NO DRIVING for 1 week    ; you may begin driving on  08/12/99   .  WOUND CARE - Keep the wound area clean and dry.   - No bandage is needed on the site.  DO  NOT apply any creams, oils, or ointments to the wound area. - If you notice any drainage or discharge from the wound, any swelling or bruising at the site, or you develop a fever > 101? F after you are discharged home, call the office at once.  Special Instructions - You are still able to use cellular telephones; use the ear opposite the side where you have your pacemaker/defibrillator.  Avoid carrying your cellular phone near your device. - When traveling through airports, show security personnel your identification card to avoid being screened in the metal detectors.  Ask the security personnel to use the hand wand. - Avoid arc welding equipment, TENS units (transcutaneous nerve stimulators).  Call the office for questions about other devices. - Avoid electrical appliances that are in poor condition or are not properly grounded. - Microwave ovens are safe to be near or to operate.

## 2018-03-29 NOTE — Progress Notes (Signed)
Patient arrived to cath holding c/o needing to void, unable, extremely agitated. In and Out cath performed with assistance and >1030mL out of bladder. Patient resting comfortably at this time without complaints.

## 2018-03-29 NOTE — Anesthesia Preprocedure Evaluation (Addendum)
Anesthesia Evaluation  Patient identified by MRN, date of birth, ID band Patient awake    Reviewed: Allergy & Precautions, NPO status , Patient's Chart, lab work & pertinent test results  Airway Mallampati: III  TM Distance: >3 FB Neck ROM: Limited    Dental  (+) Edentulous Upper, Edentulous Lower, Dental Advisory Given   Pulmonary sleep apnea , former smoker,    Pulmonary exam normal        Cardiovascular hypertension, Pt. on medications + Past MI  Normal cardiovascular exam+ pacemaker      Neuro/Psych Depression    GI/Hepatic GERD  Medicated and Controlled,  Endo/Other    Renal/GU Renal InsufficiencyRenal disease     Musculoskeletal   Abdominal   Peds  Hematology   Anesthesia Other Findings   Reproductive/Obstetrics                            Anesthesia Physical Anesthesia Plan  ASA: III  Anesthesia Plan: MAC and General   Post-op Pain Management:    Induction: Intravenous  PONV Risk Score and Plan: 1  Airway Management Planned: Oral ETT  Additional Equipment: None  Intra-op Plan:   Post-operative Plan: Extubation in OR  Informed Consent: I have reviewed the patients History and Physical, chart, labs and discussed the procedure including the risks, benefits and alternatives for the proposed anesthesia with the patient or authorized representative who has indicated his/her understanding and acceptance.   Dental advisory given  Plan Discussed with: CRNA, Surgeon and Anesthesiologist  Anesthesia Plan Comments:        Anesthesia Quick Evaluation

## 2018-03-29 NOTE — Discharge Summary (Signed)
ELECTROPHYSIOLOGY PROCEDURE DISCHARGE SUMMARY    Patient ID: [name],  MRN: [id number], DOB/AGE: 82-Apr-1933 82 y.o.  Admit date: 03/29/2018 Discharge date: 03/30/2018  Primary Care Physician: [name], MD Primary Cardiologist: [facility] Electrophysiologist: [name]  Primary Discharge Diagnosis:  NICM and symptomatic bradycardia status post CRTP upgrade this admission  Secondary Discharge Diagnosis:  1.  Permanent atrial fibrillation 2.  OSA 3.  Hypertension 4.  Obesity 5.  Gout 6.  BPH 7.  Anemia  Allergies  Allergen Reactions  . Vicodin [Hydrocodone-Acetaminophen] Itching and Nausea Only     Procedures This Admission:  1.  Upgrade of a previously implanted BSX single chamber pacemaker to CRTP on 03/29/18 by Dr [name].  See op note for full details.  There were no immediate post procedure complications. 2.  CXR on 03/30/18 demonstrated no pneumothorax status post device implantation.   Brief HPI: [name] is a 84 y.o. male with the above past medical history. He has developed worsening LV function in the setting of increased RV pacing requirements.  Risks, benefits, and alternatives to CRTP upgrade were reviewed with the patient who wished to proceed.   Hospital Course:  The patient was admitted and underwent CRTP upgrade with details as outlined above.  He  was monitored on telemetry overnight which demonstrated AF with V pacing.  Left chest was without hematoma or ecchymosis.  The device was interrogated and found to be functioning normally.  CXR was obtained and demonstrated no pneumothorax status post device implantation.  Wound care, arm mobility, and restrictions were reviewed with the patient.  The patient was examined and considered stable for discharge to home.    Physical Exam: Vitals:   03/29/18 1502 03/29/18 2106 03/30/18 0115 03/30/18 0552  BP: 108/62 104/70 108/69 115/64  Pulse: 70 79 75 72  Resp:  18 18 18   Temp:  97.9 F (36.6 C)  97.7 F (36.5 C) 97.8 F (36.6 C)  TempSrc:  Oral Oral Oral  SpO2: 100% 99% 99% 100%  Weight:    81.1 kg  Height:        GEN- The patient is elderly and chronically ill appearing, alert and oriented x 3 today.   HEENT: normocephalic, atraumatic; sclera clear, conjunctiva pink; hearing intact; oropharynx clear; neck supple  Lungs- Clear to ausculation bilaterally, normal work of breathing.  No wheezes, rales, rhonchi Heart- Regular rate and rhythm (paced) GI- soft, non-tender, non-distended, bowel sounds present  Extremities- no clubbing, cyanosis, or edema  MS- no significant deformity or atrophy Skin- warm and dry, no rash or lesion, left chest without hematoma/ecchymosis Psych- euthymic mood, full affect Neuro- strength and sensation are intact   Labs:   Lab Results  Component Value Date   WBC 3.8 03/22/2018   HGB 9.2 (L) 03/22/2018   HCT 27.9 (L) 03/22/2018   MCV 110 (H) 03/22/2018   PLT 144 (L) 03/22/2018    No results for input(s): NA, K, CL, CO2, BUN, CREATININE, CALCIUM, PROT, BILITOT, ALKPHOS, ALT, AST, GLUCOSE in the last 168 hours.  Invalid input(s): LABALBU  Discharge Medications:  Allergies as of 03/30/2018      Reactions   Vicodin [hydrocodone-acetaminophen] Itching, Nausea Only      Medication List    STOP taking these medications   feeding supplement (PRO-STAT SUGAR FREE 64) Liqd   ferrous sulfate 325 (65 FE) MG tablet   potassium chloride 10 MEQ tablet Commonly known as:  K-DUR     TAKE these  medications   allopurinol 100 MG tablet Commonly known as:  ZYLOPRIM Take 2 tablets (200 mg total) by mouth daily.   colchicine 0.6 MG tablet Take 0.6 mg by mouth daily as needed (gout flare).   dutasteride 0.5 MG capsule Commonly known as:  AVODART Take 0.5 mg by mouth daily.   escitalopram 5 MG tablet Commonly known as:  LEXAPRO Take 1 tablet (5 mg total) by mouth at bedtime.   furosemide 80 MG tablet Commonly known as:  LASIX Take 1 tablet  (80 mg total) by mouth 2 (two) times daily.   guaiFENesin 600 MG 12 hr tablet Commonly known as:  MUCINEX Take 1 tablet (600 mg total) by mouth 2 (two) times daily as needed for cough.   hydrOXYzine 25 MG tablet Commonly known as:  ATARAX/VISTARIL TAKE 1 TABLET BY MOUTH EVERY 6 HOURS AS NEEDED FOR  ITCHING What changed:  See the new instructions.   IRON 27 PO Take 27 mg by mouth 2 (two) times daily.   KLOR-CON M20 20 MEQ tablet Generic drug:  potassium chloride SA Take 20 mEq by mouth daily.   LAXATIVE PO Take 2 tablets by mouth 2 (two) times daily as needed (constipation).   loratadine 10 MG tablet Commonly known as:  CLARITIN Take 10 mg by mouth daily.   Magnesium 200 MG Tabs Take 1 tablet (200 mg total) by mouth daily.   mirtazapine 15 MG tablet Commonly known as:  REMERON Take 15 mg by mouth at bedtime.   multivitamin with minerals Tabs tablet Take 1 tablet by mouth daily.   OXYGEN Inhale 2 L into the lungs daily.   pantoprazole 40 MG tablet Commonly known as:  PROTONIX Take 1 tablet (40 mg total) by mouth daily. What changed:  when to take this   PRESERVISION AREDS 2 Caps Take 1 capsule by mouth daily.   rOPINIRole 3 MG tablet Commonly known as:  REQUIP Take 3 mg by mouth at bedtime.   tamsulosin 0.4 MG Caps capsule Commonly known as:  FLOMAX Take 0.4 mg by mouth daily after breakfast.   traMADol 50 MG tablet Commonly known as:  ULTRAM Take 100 mg by mouth 2 (two) times daily as needed for moderate pain or severe pain.       Disposition:  Discharge Instructions    Diet - low sodium heart healthy   Complete by:  As directed    Increase activity slowly   Complete by:  As directed      Follow-up Information    [facility] Follow up on 04/12/2018.   Specialty:  Cardiology Why:  at 12noon Contact information: [address] [place]       [name], MD Follow up  on 06/29/2018.   Specialty:  Cardiology Why:  at 1:45PM  Contact information: [address] [address] [phone]           Duration of Discharge Encounter: Greater than 30 minutes including physician time.  Signed, Chanetta Marshall, NP 03/30/2018 8:42 AM

## 2018-03-29 NOTE — Progress Notes (Signed)
RT called to patient's room due to desat on nasal cannula.  Pt uses CPAP at home and used CPAP in the cath lab recovery.  RT placed pt back on CPAP machine. Pt's sat improved to 100% with 4 L oxygen bled in.  Pt tolerating well at this time. RT will continue to monitor.

## 2018-03-30 ENCOUNTER — Ambulatory Visit (HOSPITAL_COMMUNITY): Payer: Medicare Other

## 2018-03-30 DIAGNOSIS — I5022 Chronic systolic (congestive) heart failure: Secondary | ICD-10-CM | POA: Diagnosis not present

## 2018-03-30 DIAGNOSIS — G2581 Restless legs syndrome: Secondary | ICD-10-CM | POA: Diagnosis not present

## 2018-03-30 DIAGNOSIS — I429 Cardiomyopathy, unspecified: Secondary | ICD-10-CM | POA: Diagnosis not present

## 2018-03-30 DIAGNOSIS — E669 Obesity, unspecified: Secondary | ICD-10-CM | POA: Diagnosis not present

## 2018-03-30 DIAGNOSIS — J449 Chronic obstructive pulmonary disease, unspecified: Secondary | ICD-10-CM | POA: Diagnosis not present

## 2018-03-30 DIAGNOSIS — K219 Gastro-esophageal reflux disease without esophagitis: Secondary | ICD-10-CM | POA: Diagnosis not present

## 2018-03-30 DIAGNOSIS — I130 Hypertensive heart and chronic kidney disease with heart failure and stage 1 through stage 4 chronic kidney disease, or unspecified chronic kidney disease: Secondary | ICD-10-CM | POA: Diagnosis not present

## 2018-03-30 DIAGNOSIS — E785 Hyperlipidemia, unspecified: Secondary | ICD-10-CM | POA: Diagnosis not present

## 2018-03-30 DIAGNOSIS — R0989 Other specified symptoms and signs involving the circulatory and respiratory systems: Secondary | ICD-10-CM | POA: Diagnosis not present

## 2018-03-30 DIAGNOSIS — I482 Chronic atrial fibrillation: Secondary | ICD-10-CM | POA: Diagnosis not present

## 2018-03-30 DIAGNOSIS — D649 Anemia, unspecified: Secondary | ICD-10-CM | POA: Diagnosis not present

## 2018-03-30 DIAGNOSIS — N183 Chronic kidney disease, stage 3 (moderate): Secondary | ICD-10-CM | POA: Diagnosis not present

## 2018-03-30 DIAGNOSIS — G4733 Obstructive sleep apnea (adult) (pediatric): Secondary | ICD-10-CM | POA: Diagnosis not present

## 2018-03-30 MED ORDER — ORAL CARE MOUTH RINSE: 15.0000 mL | Freq: Two times a day (BID) | OROMUCOSAL | Status: DC

## 2018-03-30 MED FILL — Lidocaine HCl Local Inj 1%: INTRAMUSCULAR | Qty: 60 | Status: AC

## 2018-03-30 MED FILL — Gentamicin Sulfate Inj 40 MG/ML: INTRAMUSCULAR | Qty: 80 | Status: AC

## 2018-03-30 MED FILL — Cefazolin Sodium-Dextrose IV Solution 2 GM/100ML-4%: INTRAVENOUS | Qty: 100 | Status: AC

## 2018-03-30 NOTE — Plan of Care (Signed)
  Problem: Pain Managment: Goal: General experience of comfort will improve Outcome: Adequate for Discharge   

## 2018-03-30 NOTE — Progress Notes (Signed)
Patient's wife has been called for discharge pick up.

## 2018-03-30 NOTE — Plan of Care (Signed)
  Problem: Education: Goal: Knowledge of cardiac device and self-care will improve Outcome: Progressing   Problem: Cardiac: Goal: Ability to achieve and maintain adequate cardiopulmonary perfusion will improve Outcome: Progressing   

## 2018-03-30 NOTE — Consult Note (Signed)
  01/27/18 Spoke with patient declines  [facility] CM Inpatient Consult   03/30/2018  [name] 1931-10-31 [id number]   Patient screened for potential [place] [facility] services. Patient is in the [place] of the [place] [facility] services under patient's Medicare plan. Met with the patient and he states he feels a little shaky today.  States, "I think it must have been some medication I was given for the procedure.  I am sitting here trying to get myself back together."  Patient is sitting up in the recliner chair.  He denies any pain and his nurse had just left the room. He states he doesn't like phone calls but that his wife handles that anyway.  He did accept a brochure and 24 hour nurse line with information on EMMI follow up calls. Patient politely declined states, "We are still traveling and going on a cruise next month." For questions contact:   Natividad Brood, RN BSN [facility] Liaison  [phone] business mobile phone Toll free office [phone]

## 2018-03-30 NOTE — Progress Notes (Signed)
IV's removed and cardiac monitoring D/C'd. Patient waiting for family to bring O2 tank for discharge.

## 2018-04-12 ENCOUNTER — Ambulatory Visit (INDEPENDENT_AMBULATORY_CARE_PROVIDER_SITE_OTHER): Payer: Medicare Other | Admitting: *Deleted

## 2018-04-12 DIAGNOSIS — Z950 Presence of cardiac pacemaker: Secondary | ICD-10-CM | POA: Diagnosis not present

## 2018-04-12 DIAGNOSIS — I482 Chronic atrial fibrillation: Secondary | ICD-10-CM

## 2018-04-12 DIAGNOSIS — I5022 Chronic systolic (congestive) heart failure: Secondary | ICD-10-CM

## 2018-04-12 DIAGNOSIS — I4821 Permanent atrial fibrillation: Secondary | ICD-10-CM

## 2018-04-12 LAB — CUP PACEART INCLINIC DEVICE CHECK
Battery Remaining Longevity: 39 mo
Brady Statistic RV Percent Paced: 99.26 %
Date Time Interrogation Session: 20190904143531
Implantable Lead Implant Date: 20130305
Implantable Lead Location: 753858
Implantable Lead Location: 753860
Implantable Lead Model: 4137
Implantable Lead Model: 4396
Implantable Pulse Generator Implant Date: 20190821
Lead Channel Impedance Value: 562.5 Ohm
Lead Channel Pacing Threshold Amplitude: 1 V
Lead Channel Pacing Threshold Amplitude: 2.5 V
Lead Channel Sensing Intrinsic Amplitude: 12 mV
Lead Channel Setting Pacing Amplitude: 4 V
Lead Channel Setting Pacing Pulse Width: 0.5 ms
MDC IDC LEAD IMPLANT DT: 20190821
MDC IDC LEAD SERIAL: 29020819
MDC IDC MSMT BATTERY VOLTAGE: 3.01 V
MDC IDC MSMT LEADCHNL RA IMPEDANCE VALUE: 400 Ohm
MDC IDC MSMT LEADCHNL RA PACING THRESHOLD PULSEWIDTH: 1 ms
MDC IDC MSMT LEADCHNL RV PACING THRESHOLD PULSEWIDTH: 0.5 ms
MDC IDC SET LEADCHNL RV PACING AMPLITUDE: 2.5 V
MDC IDC SET LEADCHNL RV SENSING SENSITIVITY: 5 mV
MDC IDC STAT BRADY RA PERCENT PACED: 99 %
Pulse Gen Model: 2272
Pulse Gen Serial Number: 9051195

## 2018-04-12 NOTE — Progress Notes (Signed)
Wound check appointment. Steri-strips removed. Wound without redness or edema. Incision edges approximated, wound well healed. Normal device function, LV lead in RA port. Thresholds, sensing, and impedances consistent with implant measurements. LV output fixed at 4.0V @ 1.16ms (unipolar) for extra safety margin until 91 day f/u, RV output fixed at chronic settings. Histogram distribution appropriate for patient and level of activity. Permanent AF, no OAC due to bleeding issues per patient and notes. No high ventricular rates noted. Patient educated about wound care, arm mobility, lifting restrictions, and Merlin monitor. ROV with SK on 06/29/18.

## 2018-04-25 DIAGNOSIS — I420 Dilated cardiomyopathy: Secondary | ICD-10-CM | POA: Diagnosis not present

## 2018-04-25 DIAGNOSIS — I509 Heart failure, unspecified: Secondary | ICD-10-CM | POA: Diagnosis not present

## 2018-05-01 DIAGNOSIS — I2721 Secondary pulmonary arterial hypertension: Secondary | ICD-10-CM | POA: Diagnosis not present

## 2018-05-01 DIAGNOSIS — Z9981 Dependence on supplemental oxygen: Secondary | ICD-10-CM | POA: Diagnosis not present

## 2018-05-01 DIAGNOSIS — R21 Rash and other nonspecific skin eruption: Secondary | ICD-10-CM | POA: Diagnosis not present

## 2018-05-01 DIAGNOSIS — Z6830 Body mass index (BMI) 30.0-30.9, adult: Secondary | ICD-10-CM | POA: Diagnosis not present

## 2018-05-01 DIAGNOSIS — R262 Difficulty in walking, not elsewhere classified: Secondary | ICD-10-CM | POA: Diagnosis not present

## 2018-05-01 DIAGNOSIS — I509 Heart failure, unspecified: Secondary | ICD-10-CM | POA: Diagnosis not present

## 2018-05-03 ENCOUNTER — Emergency Department (HOSPITAL_COMMUNITY): Payer: Medicare Other

## 2018-05-03 ENCOUNTER — Encounter (HOSPITAL_COMMUNITY): Payer: Self-pay | Admitting: Emergency Medicine

## 2018-05-03 ENCOUNTER — Inpatient Hospital Stay (HOSPITAL_COMMUNITY)
Admission: EM | Admit: 2018-05-03 | Discharge: 2018-05-11 | DRG: 987 | Disposition: A | Discharge status: Skilled Nursing Facility | Payer: Medicare Other | Attending: Internal Medicine | Admitting: Internal Medicine

## 2018-05-03 ENCOUNTER — Other Ambulatory Visit: Payer: Self-pay

## 2018-05-03 DIAGNOSIS — N183 Chronic kidney disease, stage 3 unspecified: Secondary | ICD-10-CM | POA: Diagnosis present

## 2018-05-03 DIAGNOSIS — M19071 Primary osteoarthritis, right ankle and foot: Secondary | ICD-10-CM | POA: Diagnosis present

## 2018-05-03 DIAGNOSIS — Z9981 Dependence on supplemental oxygen: Secondary | ICD-10-CM

## 2018-05-03 DIAGNOSIS — I482 Chronic atrial fibrillation, unspecified: Secondary | ICD-10-CM | POA: Diagnosis not present

## 2018-05-03 DIAGNOSIS — M19072 Primary osteoarthritis, left ankle and foot: Secondary | ICD-10-CM | POA: Diagnosis present

## 2018-05-03 DIAGNOSIS — R1312 Dysphagia, oropharyngeal phase: Secondary | ICD-10-CM | POA: Diagnosis not present

## 2018-05-03 DIAGNOSIS — Z7901 Long term (current) use of anticoagulants: Secondary | ICD-10-CM | POA: Diagnosis not present

## 2018-05-03 DIAGNOSIS — Z9989 Dependence on other enabling machines and devices: Secondary | ICD-10-CM

## 2018-05-03 DIAGNOSIS — G4733 Obstructive sleep apnea (adult) (pediatric): Secondary | ICD-10-CM

## 2018-05-03 DIAGNOSIS — M6281 Muscle weakness (generalized): Secondary | ICD-10-CM | POA: Diagnosis not present

## 2018-05-03 DIAGNOSIS — D649 Anemia, unspecified: Secondary | ICD-10-CM | POA: Diagnosis not present

## 2018-05-03 DIAGNOSIS — E785 Hyperlipidemia, unspecified: Secondary | ICD-10-CM | POA: Diagnosis present

## 2018-05-03 DIAGNOSIS — Z950 Presence of cardiac pacemaker: Secondary | ICD-10-CM | POA: Diagnosis not present

## 2018-05-03 DIAGNOSIS — G9341 Metabolic encephalopathy: Secondary | ICD-10-CM | POA: Diagnosis present

## 2018-05-03 DIAGNOSIS — Z515 Encounter for palliative care: Secondary | ICD-10-CM | POA: Diagnosis not present

## 2018-05-03 DIAGNOSIS — I509 Heart failure, unspecified: Secondary | ICD-10-CM | POA: Diagnosis not present

## 2018-05-03 DIAGNOSIS — I959 Hypotension, unspecified: Secondary | ICD-10-CM | POA: Diagnosis present

## 2018-05-03 DIAGNOSIS — I4821 Permanent atrial fibrillation: Secondary | ICD-10-CM | POA: Diagnosis present

## 2018-05-03 DIAGNOSIS — M1990 Unspecified osteoarthritis, unspecified site: Secondary | ICD-10-CM | POA: Diagnosis not present

## 2018-05-03 DIAGNOSIS — I5023 Acute on chronic systolic (congestive) heart failure: Secondary | ICD-10-CM

## 2018-05-03 DIAGNOSIS — J9622 Acute and chronic respiratory failure with hypercapnia: Secondary | ICD-10-CM | POA: Diagnosis present

## 2018-05-03 DIAGNOSIS — Z9842 Cataract extraction status, left eye: Secondary | ICD-10-CM

## 2018-05-03 DIAGNOSIS — I130 Hypertensive heart and chronic kidney disease with heart failure and stage 1 through stage 4 chronic kidney disease, or unspecified chronic kidney disease: Principal | ICD-10-CM | POA: Diagnosis present

## 2018-05-03 DIAGNOSIS — D469 Myelodysplastic syndrome, unspecified: Secondary | ICD-10-CM | POA: Diagnosis present

## 2018-05-03 DIAGNOSIS — R233 Spontaneous ecchymoses: Secondary | ICD-10-CM | POA: Diagnosis present

## 2018-05-03 DIAGNOSIS — E669 Obesity, unspecified: Secondary | ICD-10-CM | POA: Diagnosis present

## 2018-05-03 DIAGNOSIS — I5043 Acute on chronic combined systolic (congestive) and diastolic (congestive) heart failure: Secondary | ICD-10-CM | POA: Diagnosis present

## 2018-05-03 DIAGNOSIS — I272 Pulmonary hypertension, unspecified: Secondary | ICD-10-CM | POA: Diagnosis present

## 2018-05-03 DIAGNOSIS — N171 Acute kidney failure with acute cortical necrosis: Secondary | ICD-10-CM

## 2018-05-03 DIAGNOSIS — Z87891 Personal history of nicotine dependence: Secondary | ICD-10-CM | POA: Diagnosis not present

## 2018-05-03 DIAGNOSIS — M2550 Pain in unspecified joint: Secondary | ICD-10-CM | POA: Diagnosis not present

## 2018-05-03 DIAGNOSIS — Z23 Encounter for immunization: Secondary | ICD-10-CM

## 2018-05-03 DIAGNOSIS — Z961 Presence of intraocular lens: Secondary | ICD-10-CM | POA: Diagnosis present

## 2018-05-03 DIAGNOSIS — E876 Hypokalemia: Secondary | ICD-10-CM | POA: Diagnosis present

## 2018-05-03 DIAGNOSIS — I071 Rheumatic tricuspid insufficiency: Secondary | ICD-10-CM | POA: Diagnosis present

## 2018-05-03 DIAGNOSIS — J9621 Acute and chronic respiratory failure with hypoxia: Secondary | ICD-10-CM | POA: Diagnosis not present

## 2018-05-03 DIAGNOSIS — Z7189 Other specified counseling: Secondary | ICD-10-CM | POA: Diagnosis not present

## 2018-05-03 DIAGNOSIS — M109 Gout, unspecified: Secondary | ICD-10-CM | POA: Diagnosis present

## 2018-05-03 DIAGNOSIS — D61818 Other pancytopenia: Secondary | ICD-10-CM | POA: Diagnosis not present

## 2018-05-03 DIAGNOSIS — M19011 Primary osteoarthritis, right shoulder: Secondary | ICD-10-CM | POA: Diagnosis not present

## 2018-05-03 DIAGNOSIS — I2510 Atherosclerotic heart disease of native coronary artery without angina pectoris: Secondary | ICD-10-CM | POA: Diagnosis present

## 2018-05-03 DIAGNOSIS — R21 Rash and other nonspecific skin eruption: Secondary | ICD-10-CM | POA: Diagnosis not present

## 2018-05-03 DIAGNOSIS — I252 Old myocardial infarction: Secondary | ICD-10-CM

## 2018-05-03 DIAGNOSIS — I428 Other cardiomyopathies: Secondary | ICD-10-CM | POA: Diagnosis present

## 2018-05-03 DIAGNOSIS — R52 Pain, unspecified: Secondary | ICD-10-CM

## 2018-05-03 DIAGNOSIS — Z8249 Family history of ischemic heart disease and other diseases of the circulatory system: Secondary | ICD-10-CM

## 2018-05-03 DIAGNOSIS — J9601 Acute respiratory failure with hypoxia: Secondary | ICD-10-CM | POA: Diagnosis not present

## 2018-05-03 DIAGNOSIS — Z79891 Long term (current) use of opiate analgesic: Secondary | ICD-10-CM

## 2018-05-03 DIAGNOSIS — G2581 Restless legs syndrome: Secondary | ICD-10-CM | POA: Diagnosis present

## 2018-05-03 DIAGNOSIS — D4620 Refractory anemia with excess of blasts, unspecified: Secondary | ICD-10-CM

## 2018-05-03 DIAGNOSIS — I110 Hypertensive heart disease with heart failure: Secondary | ICD-10-CM | POA: Diagnosis not present

## 2018-05-03 DIAGNOSIS — Z9049 Acquired absence of other specified parts of digestive tract: Secondary | ICD-10-CM

## 2018-05-03 DIAGNOSIS — N179 Acute kidney failure, unspecified: Secondary | ICD-10-CM | POA: Diagnosis present

## 2018-05-03 DIAGNOSIS — E611 Iron deficiency: Secondary | ICD-10-CM | POA: Diagnosis not present

## 2018-05-03 DIAGNOSIS — J449 Chronic obstructive pulmonary disease, unspecified: Secondary | ICD-10-CM | POA: Diagnosis present

## 2018-05-03 DIAGNOSIS — Z96653 Presence of artificial knee joint, bilateral: Secondary | ICD-10-CM | POA: Diagnosis present

## 2018-05-03 DIAGNOSIS — Z9861 Coronary angioplasty status: Secondary | ICD-10-CM

## 2018-05-03 DIAGNOSIS — D7589 Other specified diseases of blood and blood-forming organs: Secondary | ICD-10-CM | POA: Diagnosis not present

## 2018-05-03 DIAGNOSIS — Z66 Do not resuscitate: Secondary | ICD-10-CM | POA: Diagnosis present

## 2018-05-03 DIAGNOSIS — N400 Enlarged prostate without lower urinary tract symptoms: Secondary | ICD-10-CM | POA: Diagnosis present

## 2018-05-03 DIAGNOSIS — Z7401 Bed confinement status: Secondary | ICD-10-CM | POA: Diagnosis not present

## 2018-05-03 DIAGNOSIS — Z79899 Other long term (current) drug therapy: Secondary | ICD-10-CM

## 2018-05-03 DIAGNOSIS — K219 Gastro-esophageal reflux disease without esophagitis: Secondary | ICD-10-CM | POA: Diagnosis present

## 2018-05-03 DIAGNOSIS — R0602 Shortness of breath: Secondary | ICD-10-CM | POA: Diagnosis not present

## 2018-05-03 DIAGNOSIS — N5089 Other specified disorders of the male genital organs: Secondary | ICD-10-CM | POA: Diagnosis present

## 2018-05-03 DIAGNOSIS — N281 Cyst of kidney, acquired: Secondary | ICD-10-CM | POA: Diagnosis not present

## 2018-05-03 DIAGNOSIS — L299 Pruritus, unspecified: Secondary | ICD-10-CM | POA: Diagnosis present

## 2018-05-03 DIAGNOSIS — R262 Difficulty in walking, not elsewhere classified: Secondary | ICD-10-CM | POA: Diagnosis not present

## 2018-05-03 DIAGNOSIS — Z9841 Cataract extraction status, right eye: Secondary | ICD-10-CM

## 2018-05-03 DIAGNOSIS — D631 Anemia in chronic kidney disease: Secondary | ICD-10-CM | POA: Diagnosis not present

## 2018-05-03 DIAGNOSIS — Z885 Allergy status to narcotic agent status: Secondary | ICD-10-CM

## 2018-05-03 DIAGNOSIS — R0989 Other specified symptoms and signs involving the circulatory and respiratory systems: Secondary | ICD-10-CM | POA: Diagnosis not present

## 2018-05-03 DIAGNOSIS — Z6830 Body mass index (BMI) 30.0-30.9, adult: Secondary | ICD-10-CM

## 2018-05-03 HISTORY — DX: Dyspnea, unspecified: R06.00

## 2018-05-03 LAB — CBC
HCT: 27.1 % — ABNORMAL LOW (ref 39.0–52.0)
Hemoglobin: 8.7 g/dL — ABNORMAL LOW (ref 13.0–17.0)
MCH: 36.7 pg — ABNORMAL HIGH (ref 26.0–34.0)
MCHC: 32.1 g/dL (ref 30.0–36.0)
MCV: 114.3 fL — AB (ref 78.0–100.0)
PLATELETS: 118 10*3/uL — AB (ref 150–400)
RBC: 2.37 MIL/uL — AB (ref 4.22–5.81)
RDW: 17.4 % — ABNORMAL HIGH (ref 11.5–15.5)
WBC: 3.2 10*3/uL — AB (ref 4.0–10.5)

## 2018-05-03 LAB — BASIC METABOLIC PANEL
Anion gap: 11 (ref 5–15)
BUN: 70 mg/dL — ABNORMAL HIGH (ref 8–23)
CHLORIDE: 96 mmol/L — AB (ref 98–111)
CO2: 32 mmol/L (ref 22–32)
CREATININE: 1.94 mg/dL — AB (ref 0.61–1.24)
Calcium: 9 mg/dL (ref 8.9–10.3)
GFR, EST AFRICAN AMERICAN: 35 mL/min — AB (ref >60)
GFR, EST NON AFRICAN AMERICAN: 30 mL/min — AB (ref >60)
Glucose, Bld: 97 mg/dL (ref 70–99)
Potassium: 4 mmol/L (ref 3.5–5.1)
SODIUM: 139 mmol/L (ref 135–145)

## 2018-05-03 LAB — I-STAT TROPONIN, ED: Troponin i, poc: 0.03 ng/mL (ref 0.00–0.08)

## 2018-05-03 LAB — BRAIN NATRIURETIC PEPTIDE: B Natriuretic Peptide: 1865 pg/mL — ABNORMAL HIGH (ref 0.0–100.0)

## 2018-05-03 MED ORDER — TAMSULOSIN HCL 0.4 MG PO CAPS
0.4000 mg | ORAL_CAPSULE | Freq: Every day | ORAL | Status: DC
  Administered 2018-05-03 – 2018-05-11 (×9): 0.4 mg via ORAL
  Filled 2018-05-03 (×9): qty 1

## 2018-05-03 MED ORDER — ONDANSETRON HCL 4 MG/2ML IJ SOLN: 4.0000 mg | Freq: Four times a day (QID) | INTRAMUSCULAR | Status: DC | PRN

## 2018-05-03 MED ORDER — HYDROXYZINE HCL 25 MG PO TABS
25.0000 mg | ORAL_TABLET | Freq: Once | ORAL | Status: AC
  Administered 2018-05-03: 25 mg via ORAL

## 2018-05-03 MED ORDER — MIRTAZAPINE 15 MG PO TABS
15.0000 mg | ORAL_TABLET | Freq: Every day | ORAL | Status: DC
Start: 2018-05-03 — End: 2018-05-04
  Administered 2018-05-03: 15 mg via ORAL
  Filled 2018-05-03: qty 1

## 2018-05-03 MED ORDER — SODIUM CHLORIDE 0.9 % IV SOLN
250.0000 mL | INTRAVENOUS | Status: DC | PRN
  Administered 2018-05-09: 250 mL via INTRAVENOUS

## 2018-05-03 MED ORDER — DUTASTERIDE 0.5 MG PO CAPS
0.5000 mg | ORAL_CAPSULE | Freq: Every day | ORAL | Status: DC
  Administered 2018-05-03 – 2018-05-11 (×9): 0.5 mg via ORAL
  Filled 2018-05-03 (×10): qty 1

## 2018-05-03 MED ORDER — INFLUENZA VAC SPLIT HIGH-DOSE 0.5 ML IM SUSY
0.5000 mL | PREFILLED_SYRINGE | INTRAMUSCULAR | Status: AC
  Administered 2018-05-05: 0.5 mL via INTRAMUSCULAR
  Filled 2018-05-03 (×2): qty 0.5

## 2018-05-03 MED ORDER — HYDROXYZINE HCL 10 MG PO TABS
10.0000 mg | ORAL_TABLET | Freq: Once | ORAL | Status: DC
  Filled 2018-05-03: qty 1

## 2018-05-03 MED ORDER — SODIUM CHLORIDE 0.9% FLUSH: 3.0000 mL | INTRAVENOUS | Status: DC | PRN

## 2018-05-03 MED ORDER — TRAMADOL HCL 50 MG PO TABS
100.0000 mg | ORAL_TABLET | Freq: Two times a day (BID) | ORAL | Status: DC
  Administered 2018-05-03 – 2018-05-04 (×2): 100 mg via ORAL
  Filled 2018-05-03 (×3): qty 2

## 2018-05-03 MED ORDER — ROPINIROLE HCL 1 MG PO TABS
4.0000 mg | ORAL_TABLET | Freq: Every day | ORAL | Status: DC
  Administered 2018-05-03 – 2018-05-11 (×9): 4 mg via ORAL
  Filled 2018-05-03 (×10): qty 4

## 2018-05-03 MED ORDER — FUROSEMIDE 10 MG/ML IJ SOLN
80.0000 mg | Freq: Two times a day (BID) | INTRAMUSCULAR | Status: DC
  Administered 2018-05-03 – 2018-05-04 (×2): 80 mg via INTRAVENOUS
  Filled 2018-05-03 (×2): qty 8

## 2018-05-03 MED ORDER — HYDROXYZINE HCL 25 MG PO TABS
25.0000 mg | ORAL_TABLET | Freq: Four times a day (QID) | ORAL | Status: DC | PRN
  Administered 2018-05-03 (×2): 25 mg via ORAL
  Filled 2018-05-03 (×2): qty 1

## 2018-05-03 MED ORDER — PANTOPRAZOLE SODIUM 40 MG PO TBEC
40.0000 mg | DELAYED_RELEASE_TABLET | Freq: Every day | ORAL | Status: DC
  Administered 2018-05-03 – 2018-05-11 (×9): 40 mg via ORAL
  Filled 2018-05-03 (×9): qty 1

## 2018-05-03 MED ORDER — ENOXAPARIN SODIUM 30 MG/0.3ML ~~LOC~~ SOLN
30.0000 mg | SUBCUTANEOUS | Status: DC
  Administered 2018-05-03 – 2018-05-05 (×3): 30 mg via SUBCUTANEOUS
  Filled 2018-05-03 (×5): qty 0.3

## 2018-05-03 MED ORDER — OCUVITE-LUTEIN PO CAPS
1.0000 | ORAL_CAPSULE | Freq: Every day | ORAL | Status: DC
  Filled 2018-05-03 (×4): qty 1

## 2018-05-03 MED ORDER — SODIUM CHLORIDE 0.9% FLUSH
3.0000 mL | Freq: Two times a day (BID) | INTRAVENOUS | Status: DC
  Administered 2018-05-03 – 2018-05-11 (×15): 3 mL via INTRAVENOUS

## 2018-05-03 MED ORDER — ALLOPURINOL 100 MG PO TABS
200.0000 mg | ORAL_TABLET | Freq: Every day | ORAL | Status: DC
  Administered 2018-05-03 – 2018-05-11 (×9): 200 mg via ORAL
  Filled 2018-05-03 (×11): qty 2

## 2018-05-03 MED ORDER — ESCITALOPRAM OXALATE 10 MG PO TABS
5.0000 mg | ORAL_TABLET | Freq: Every day | ORAL | Status: DC
  Administered 2018-05-03 – 2018-05-11 (×9): 5 mg via ORAL
  Filled 2018-05-03 (×9): qty 1

## 2018-05-03 MED ORDER — ACETAMINOPHEN 325 MG PO TABS
650.0000 mg | ORAL_TABLET | ORAL | Status: DC | PRN
  Administered 2018-05-03 – 2018-05-09 (×3): 650 mg via ORAL
  Filled 2018-05-03 (×3): qty 2

## 2018-05-03 MED ORDER — ASPIRIN EC 81 MG PO TBEC
81.0000 mg | DELAYED_RELEASE_TABLET | Freq: Every day | ORAL | Status: DC
  Administered 2018-05-03 – 2018-05-11 (×9): 81 mg via ORAL
  Filled 2018-05-03 (×9): qty 1

## 2018-05-03 MED ORDER — FUROSEMIDE 10 MG/ML IJ SOLN
80.0000 mg | Freq: Once | INTRAMUSCULAR | Status: AC
  Administered 2018-05-03: 80 mg via INTRAVENOUS
  Filled 2018-05-03: qty 8

## 2018-05-03 NOTE — H&P (Signed)
History and Physical    [name] QMV:[id number] DOB: 10-25-1931 DOA: 05/03/2018  PCP: [name], MD Consultants:  [facility] - cardiology Patient coming from:  Home - lives with wife; NOK: Wife, [phone]  Chief Complaint: SOB  HPI: Anthony BECKSTRAND Sr. is a 84 y.o. male with medical history significant of RLS; pulmonary HTN; pacemaker placement; afib on Eliquis; OSA on CPAP; HTN; HLD; CAD; stage 3 CKD; and CHF (EF 25-30% in 6/19) presenting with SOB.  He has had symptoms for a couple of days.  He has been breaking out in a rash and itching all over, as well.  The rash has been present for a couple of days.   +LE edema.  Weight increased from 179-191.  No orthopnea.  He wears 2L home O2, but this AM his sats were 84% on 2L.   ED Course:  CHF exacerbation.  SOB, edema, weight gain. He had an LV lead placed and symptoms since.  He went on a cruise and got symptoms there, received Lasix.  On 2L home O2, sats in 70s today so increased O2.  Bedside ultrasound without obvious pericardial effusion.  Petechiae on LE, new x days - platelets 118.  He was started on Doxy 3 days ago for ?MRSA rash - ?related to doxy.  Review of Systems: As per HPI; otherwise review of systems reviewed and negative.   Ambulatory Status:  Ambulates without assistance  Past Medical History:  Diagnosis Date  . Arthritis    "feet" (09/30/2017)  . CHF (congestive heart failure) ([place])   . Chronic bronchitis ([place])   . CKD (chronic kidney disease), stage III ([place])    [name] 09/30/2017  . Coronary artery disease   . Diverticulitis   . GERD (gastroesophageal reflux disease)   . Gout   . History of blood transfusion    "blood loss" (09/30/2017)  . Hyperlipidemia   . Hypertension   . MI (myocardial infarction) ([place]) ~ 2000   "light one"  . Nonischemic cardiomyopathy (HCC)    mild  . OSA on CPAP   . Pacemaker single chamber, [place], 2013 10/13/2011  . Permanent atrial fibrillation  (HCC)    on Eliquis  . Presence of permanent cardiac pacemaker   . Pulmonary hypertension ([place])   . Restless legs     Past Surgical History:  Procedure Laterality Date  . APPENDECTOMY  12/2000   [name] 12/22/2010  . BIV UPGRADE N/A 02/22/2018   Procedure: BIV UPGRADE;  Surgeon: [name], MD;  Location: [facility] [facility];  Service: Cardiovascular;  Laterality: N/A;  . BIV UPGRADE N/A 03/29/2018   Procedure: BIV PPM UPGRADE;  Surgeon: [name], MD;  Location: [facility];  Service: Cardiovascular;  Laterality: N/A;  . CARDIAC CATHETERIZATION  11/07/2008   nonischemic cardiomyopathy,pulmonary hypertension  . CATARACT EXTRACTION W/ INTRAOCULAR LENS  IMPLANT, BILATERAL Bilateral   . CIRCUMCISION  12/2005   [name] 12/22/2010  . COLOSTOMY  12/2000   [name] 12/22/2010  . COLOSTOMY REVERSAL  07/2001   [name] 12/22/2010  . CORONARY ANGIOPLASTY  06/01/1999   successful to ostium of the first diagonal  . ESOPHAGOGASTRODUODENOSCOPY N/A 08/22/2013   Procedure: ESOPHAGOGASTRODUODENOSCOPY (EGD);  Surgeon: [name], MD;  Location[facility] ENDOSCOPY;  Service: Endoscopy;  Laterality: N/A;  h/p in file cabinet, jackie  . ESOPHAGOGASTRODUODENOSCOPY N/A 11/05/2014   Procedure: ESOPHAGOGASTRODUODENOSCOPY (EGD);  Surgeon: Clarene Essex, MD;  Location: [facility] ENDOSCOPY;  Service: Endoscopy;  Laterality: N/A;  .  ESOPHAGOGASTRODUODENOSCOPY N/A 11/08/2016   Procedure: ESOPHAGOGASTRODUODENOSCOPY (EGD);  Surgeon: [name], MD;  Location: [facility] ENDOSCOPY;  Service: Endoscopy;  Laterality: N/A;  . ESOPHAGOGASTRODUODENOSCOPY (EGD) WITH PROPOFOL Left 11/05/2017   Procedure: ESOPHAGOGASTRODUODENOSCOPY (EGD) WITH PROPOFOL;  Surgeon: [name], MD;  Location: [place];  Service: Endoscopy;  Laterality: Left;  . HOT HEMOSTASIS N/A 11/05/2014   Procedure: HOT HEMOSTASIS (ARGON PLASMA COAGULATION/BICAP);  Surgeon: Clarene Essex, MD;  Location: [facility] ENDOSCOPY;  Service: Endoscopy;  Laterality: N/A;  . INSERT /  REPLACE / REMOVE PACEMAKER    . JOINT REPLACEMENT    . MASS EXCISION Left    hand w/ulnar artery reconstruction/notes 12/22/2010  . NM MYOCAR PERF WALL MOTION  11/24/2007   normal  . PERMANENT PACEMAKER INSERTION  10/04/2012   [employer]  . PERMANENT PACEMAKER INSERTION N/A 10/12/2011   Procedure: PERMANENT PACEMAKER INSERTION;  Surgeon: [name], MD;  Location: [place] CATH LAB;  Service: Cardiovascular;  Laterality: N/A;  . REPLACEMENT TOTAL KNEE Bilateral 11/2006   right-left/notes 12/22/2010  . ROTATOR CUFF REPAIR Right 09/2004   [name] 12/22/2010  . SAVORY DILATION N/A 08/22/2013   Procedure: SAVORY DILATION;  Surgeon: [name], MD;  Location: [facility] ENDOSCOPY;  Service: Endoscopy;  Laterality: N/A;  . SHOULDER SURGERY Right    "fell off house; messed up 3 things in my arm"  . TONSILLECTOMY    . US ECHOCARDIOGRAPHY  02/01/2011   LA is mod-severely dilated,AOV & root sclerotic,ca+ AOV leaflets    Social History   Socioeconomic History  . Marital status: Married    Spouse name: Not on file  . Number of children: Not on file  . Years of education: Not on file  . Highest education level: Not on file  Occupational History  . Occupation: retired  Scientific laboratory technician  . Financial resource strain: Not on file  . Food insecurity:    Worry: Not on file    Inability: Not on file  . Transportation needs:    Medical: Not on file    Non-medical: Not on file  Tobacco Use  . Smoking status: Former Research scientist (life sciences)  . Smokeless tobacco: Never Used  . Tobacco comment: 09/30/2017 "it's been over 43yr since I smoked anything"  Substance and Sexual Activity  . Alcohol use: No  . Drug use: No  . Sexual activity: Never  Lifestyle  . Physical activity:    Days per week: Not on file    Minutes per session: Not on file  . Stress: Not on file  Relationships  . Social connections:    Talks on phone: Not on file    Gets together: Not on file    Attends religious service: Not on file    Active member  of club or organization: Not on file    Attends meetings of clubs or organizations: Not on file    Relationship status: Not on file  . Intimate partner violence:    Fear of current or ex partner: Not on file    Emotionally abused: Not on file    Physically abused: Not on file    Forced sexual activity: Not on file  Other Topics Concern  . Not on file  Social History Narrative  . Not on file    Allergies  Allergen Reactions  . Vicodin [Hydrocodone-Acetaminophen] Itching and Nausea Only    Family History  Problem Relation Age of Onset  . Cancer Mother   . Heart attack Father   . Diabetes Brother  Prior to Admission medications   Medication Sig Start Date End Date Taking? Authorizing Provider  allopurinol (ZYLOPRIM) 100 MG tablet Take 2 tablets (200 mg total) by mouth daily. 11/09/16  Yes Theodis Blaze, MD  doxycycline (VIBRA-TABS) 100 MG tablet Take 100 mg by mouth 2 (two) times daily. for 10 days 05/01/18  Yes [provider]  dutasteride (AVODART) 0.5 MG capsule Take 0.5 mg by mouth daily.   Yes [provider]  escitalopram (LEXAPRO) 5 MG tablet Take 1 tablet (5 mg total) by mouth at bedtime. 11/09/16  Yes Theodis Blaze, MD  furosemide (LASIX) 80 MG tablet Take 1 tablet (80 mg total) by mouth 2 (two) times daily. 01/25/18  Yes Lendon Colonel, NP  hydrOXYzine (ATARAX/VISTARIL) 25 MG tablet TAKE 1 TABLET BY MOUTH EVERY 6 HOURS AS NEEDED FOR  ITCHING Patient taking differently: Take 25 mg by mouth every 6 (six) hours as needed for itching.  01/11/18  Yes Croitoru, Mihai, MD  mirtazapine (REMERON) 15 MG tablet Take 15 mg by mouth at bedtime.   Yes [provider]  Multiple Vitamin (MULTIVITAMIN WITH MINERALS) TABS tablet Take 1 tablet by mouth daily.   Yes [provider]  Multiple Vitamins-Minerals (PRESERVISION AREDS 2) CAPS Take 1 capsule by mouth daily.   Yes [provider]  mupirocin ointment (BACTROBAN) 2 % Apply 1 application  topically 2 (two) times daily. To toe 05/01/18  Yes [provider]  pantoprazole (PROTONIX) 40 MG tablet Take 1 tablet (40 mg total) by mouth daily. 11/08/17  Yes Nita Sells, MD  potassium chloride (K-DUR) 10 MEQ tablet Take 10 mEq by mouth 2 (two) times daily.   Yes [provider]  rOPINIRole (REQUIP) 4 MG tablet Take 4 mg by mouth at bedtime.   Yes [provider]  Tamsulosin HCl (FLOMAX) 0.4 MG CAPS Take 0.4 mg by mouth daily after breakfast.   Yes [provider]  traMADol (ULTRAM) 50 MG tablet Take 100 mg by mouth 2 (two) times daily.    Yes [provider]  triamcinolone cream (KENALOG) 0.1 % Apply 1 application topically 2 (two) times daily.  05/01/18  Yes [provider]  guaiFENesin (MUCINEX) 600 MG 12 hr tablet Take 1 tablet (600 mg total) by mouth 2 (two) times daily as needed for cough. Patient not taking: Reported on 05/03/2018 01/17/18   Rai, Vernelle Emerald, MD  Magnesium 200 MG TABS Take 1 tablet (200 mg total) by mouth daily. Patient not taking: Reported on 05/03/2018 03/08/18   Lendon Colonel, NP  OXYGEN Inhale 2 L into the lungs daily.    [provider]    Physical Exam: Vitals:   05/03/18 0515 05/03/18 0545 05/03/18 0630 05/03/18 0700  BP: 115/80 109/72 110/70 113/73  Pulse: 75 75 76 70  Resp: (!) 21 (!) 21 17 18   Temp:      TempSrc:      SpO2: 97% 97% 99% 98%  Weight:      Height:         General:  Appears somewhat lethargic, although he is able to wake up and answers questions appropriately Eyes:  PERRL, EOMI, normal lids, iris ENT:  grossly normal hearing, lips & tongue, mmm; edentulous Neck:  no LAD, masses or thyromegaly; no carotid bruits Cardiovascular:  RRR, no r/g, 7-6/7 systolic murmur. No LE edema - but marked scrotal edema. Respiratory:   CTA bilaterally with no wheezes/rales/rhonchi.  Normal respiratory effort. Abdomen:  soft, NT,  ND, NABS, multiple surgical scars Skin:  Petechial  rash on legs > abdomen, chest Musculoskeletal:  grossly normal tone BUE/BLE, good ROM, no bony abnormality Lower extremity:  No LE edema.  Limited foot exam with no ulcerations.  2+ distal pulses. Psychiatric: lethargic but able to answer questions appropriately, speech fluent and appropriate, AOx3 Neurologic:  CN 2-12 grossly intact, moves all extremities in coordinated fashion, sensation intact    Radiological Exams on Admission: Dg Chest 2 View  Result Date: 05/03/2018 CLINICAL DATA:  84 year old male with CHF. EXAM: CHEST - 2 VIEW COMPARISON:  Chest radiograph dated 03/30/2018 FINDINGS: There is cardiomegaly with mild vascular congestion and probable mild edema. No focal consolidation, pleural effusion, or pneumothorax. There is atherosclerotic calcification of the aorta. The aorta is tortuous. Left pectoral pacemaker device. Osteopenia with degenerative changes of the spine. No acute osseous pathology. IMPRESSION: Cardiomegaly with mild vascular congestion and probable mild interstitial edema. Electronically Signed   By: Anner Crete M.D.   On: 05/03/2018 04:53    EKG: Independently reviewed.  Ventricularly paced with rate 71   Labs on Admission: I have personally reviewed the available labs and imaging studies at the time of the admission.  Pertinent labs:   BUN 70/Creatinine 1.94/GFR 30; 43/1.18/56 on 8/14 BNP 1865.0; 1360.9 on 01/12/18 Troponin 0.03 WBC 3.2 - stable Hgb 8.7 - stable Plt 118 - chronic   Assessment/Plan Principal Problem:   Acute on chronic respiratory failure with hypoxia (HCC) Active Problems:   OSA on CPAP   BPH (benign prostatic hyperplasia)   Chronic atrial fibrillation (HCC)   Acute renal failure superimposed on stage 3 chronic kidney disease (HCC)   Biventricular cardiac pacemaker  [employer] [place]  dual chamber with LV in atrial port    Acute on chronic systolic heart failure (HCC)   Petechial rash   Acute on chronic respiratory failure with hypoxia  resulting from acute on chronic CHF exacerbation -Patient presenting with worsening SOB and hypoxia; he wears home O2 but had decrease in O2 sats to 70-80% despite home O2 this AM -CXR consistent with pulmonary edema -Elevated BNP, worse than in 6/19 -With elevated BNP and abnl CXR, recurrent CHF seems probable as diagnosis -Prior echocardiogram on 6/7 showed EF 35-40% with myocardial akinesis -Will admit with telemetry based on need for increased O2, IV medications, and concomitant heart failure and renal failure -Will request cardiology/CHF team evaluation; consider repeat Echo -Will start ASA -No ACE due to renal insufficiency -No beta blocker due to borderline hypotension on presentation -CHF order set utilized -Was given Lasix 80 mg x 1 in ER and will repeat with 80 mg IV BID -Continue  O2 for now -Repeat EKG in AM  Acute on chronic renal failure -Likely due to decreased renal perfusion in the setting of CHF -Anticipate improvement with diuresis -Follow up renal function by BMP -Avoid ACEI and NSAIDs  Afib, not on [facility] -He is rate controlled with his pacer -Eliquis was stopped remotely due to fall risk and h/o GI bleeding  Biventricular pacer placement with recent lead change -Patient was admitted to the cardiology team in 8/19 -He underwent CRTP upgrade during that admission -Bedside ultrasound was performed by Dr. Leonette Monarch in the ER to ensure that there was no evidence of pericardial effusion related to recent lead change  Rash -Patient was recently started on Doxy for rash - unclear if that was for current petechial rash or if the current rash developed subsequent to starting doxy -At this time, there  is no obvious infectious source and so will d/c doxy and monitor -He does have chronic thrombocytopenia and they are slightly lower than his platelets have been in a while although certainly not critically low -Sulfa/Lasix allergy is also a consideration -For now, watchful  waiting off medications -Hydralazine prn itching  BPH -Patient with limited UOP despite Lasix while in the ER -Bedside bladder scan showed 400+ residual -I/O cath performed -Scrotal edema may be contributing to retention -May need to consider foley upon arrival on floor -Continue Avodart and Flomax  OSA on CPAP -Continue CPAP    DVT prophylaxis:  Lovenox  Code Status:  Full - confirmed with patient Family Communication: None present Disposition Plan:  Home once clinically improved Consults called: Cardiology; CM/PT  Admission status: Admit - It is my clinical opinion that admission to [place] is reasonable and necessary because of the expectation that this patient will require hospital care that crosses at least 2 midnights to treat this condition based on the medical complexity of the problems presented.  Given the aforementioned information, the predictability of an adverse outcome is felt to be significant.    Karmen Bongo MD [facility]  If note is complete, please contact covering daytime or nighttime physician. [email or web address] Password [email or web address]  05/03/2018, 11:15 AM

## 2018-05-03 NOTE — ED Provider Notes (Addendum)
[facility] Provider Note  CSN: [id number] Arrival date & time: 05/03/18 1660  Chief Complaint(s) Congestive Heart Failure and Groin Swelling  HPI Anthony CLOE Sr. is a 84 y.o. male with extensive past medical history listed below including CHF on 2 L nasal cannula at home with a last EF of 25 to 35% seen June of this year who presents to the emergency department with 3 weeks of gradually worsening peripheral edema and dyspnea on exertion.  Patient and family report that while on a cruise ship earlier this month the patient had a CHF exacerbation requiring IV Lasix.  This improved his symptoms however over the past week they have noted increased abdominal distention and scrotal swelling with lower extremity edema consistent with his previous CHF exacerbations.  They also report that the patient had shortness of breath earlier today noted that his saturations were in the 70s on his 2 L nasal cannula.  He bumped himself up to 3 L at that time and had improved shortness of breath.  He also reports that since his recent ventricular lead placement for his ICD, the patient has had 16 pounds of weight gain.  He has been compliant with his Lasix at home but has had decreased urine output.    He denies any associated chest pain.  He has had a dry cough.  No fevers or chills.  No nausea or vomiting.  No abdominal pain.  Additionally the patient has had petechia to the lower extremities over the past 2 days.  He has also had what they believed to be a MRSA skin rash and was placed on doxycycline 3 days ago.  HPI   Past Medical History Past Medical History:  Diagnosis Date  . Arthritis    "feet" (09/30/2017)  . CHF (congestive heart failure) ([place])   . Chronic bronchitis ([place])   . CKD (chronic kidney disease), stage III ([place])    [name] 09/30/2017  . Coronary artery disease   . Diverticulitis   . GERD (gastroesophageal reflux disease)   . Gout   . History of  blood transfusion    "blood loss" (09/30/2017)  . Hyperlipidemia   . Hypertension   . MI (myocardial infarction) ([place]) ~ 2000   "light one"  . Nonischemic cardiomyopathy (HCC)    mild  . OSA on CPAP   . Pacemaker single chamber, [place], 2013 10/13/2011  . Permanent atrial fibrillation (HCC)    on Eliquis  . Presence of permanent cardiac pacemaker   . Pulmonary hypertension ([place])   . Restless legs    Patient Active Problem List   Diagnosis Date Noted  . Biventricular cardiac pacemaker  [place]  dual chamber with LV in atrial port  03/29/2018  . Congestive heart failure, NYHA class 3, chronic, systolic ([place]) 63/08/6008  . CHF exacerbation ([place]) 01/12/2018  . GI bleed 11/03/2017  . Chest pain 11/03/2017  . CHF (congestive heart failure) ([place]) 09/30/2017  . Pancytopenia ([place]) 09/26/2017  . CKD (chronic kidney disease), stage III ([place]) 09/26/2017  . Chronic pain 09/26/2017  . Right shoulder injury, initial encounter 04/08/2017  . Chronic atrial fibrillation ([place]) 11/23/2016  . Secondary hypercoagulable state ([place])   . Transaminitis   . PAH (pulmonary artery hypertension) ([place])   . Chronic systolic heart failure ([place]) 09/09/2015  . Poor short term memory 11/05/2013  . Fatigue 10/13/2011  . Dyspnea on exertion 10/13/2011  . Restless leg syndrome 10/13/2011  . Chronic anticoagulation 10/13/2011  .  Nonischemic cardiomyopathy - coronaries by angiography 2010 10/13/2011  . OSA on CPAP 10/13/2011  . Tremor, hereditary, benign 10/13/2011  . Depression 10/13/2011  . Other secondary pulmonary hypertension ([place]) 10/13/2011  . MRSA (methicillin resistant Staphylococcus aureus) carrier 10/13/2011  . BPH (benign prostatic hyperplasia) 10/13/2011   Home Medication(s) Prior to Admission medications   Medication Sig Start Date End Date Taking? Authorizing Provider  allopurinol (ZYLOPRIM) 100 MG tablet Take 2 tablets (200 mg total) by mouth daily. 11/09/16  Yes Theodis Blaze, MD  doxycycline (VIBRA-TABS) 100 MG tablet Take 100 mg by mouth 2 (two) times daily. for 10 days 05/01/18  Yes [provider]  dutasteride (AVODART) 0.5 MG capsule Take 0.5 mg by mouth daily.   Yes [provider]  escitalopram (LEXAPRO) 5 MG tablet Take 1 tablet (5 mg total) by mouth at bedtime. 11/09/16  Yes Theodis Blaze, MD  furosemide (LASIX) 80 MG tablet Take 1 tablet (80 mg total) by mouth 2 (two) times daily. 01/25/18  Yes Lendon Colonel, NP  hydrOXYzine (ATARAX/VISTARIL) 25 MG tablet TAKE 1 TABLET BY MOUTH EVERY 6 HOURS AS NEEDED FOR  ITCHING Patient taking differently: Take 25 mg by mouth every 6 (six) hours as needed for itching.  01/11/18  Yes Croitoru, Mihai, MD  mirtazapine (REMERON) 15 MG tablet Take 15 mg by mouth at bedtime.   Yes [provider]  Multiple Vitamin (MULTIVITAMIN WITH MINERALS) TABS tablet Take 1 tablet by mouth daily.   Yes [provider]  Multiple Vitamins-Minerals (PRESERVISION AREDS 2) CAPS Take 1 capsule by mouth daily.   Yes [provider]  mupirocin ointment (BACTROBAN) 2 % Apply 1 application topically 2 (two) times daily. To toe 05/01/18  Yes [provider]  pantoprazole (PROTONIX) 40 MG tablet Take 1 tablet (40 mg total) by mouth daily. 11/08/17  Yes Nita Sells, MD  potassium chloride (K-DUR) 10 MEQ tablet Take 10 mEq by mouth 2 (two) times daily.   Yes [provider]  rOPINIRole (REQUIP) 4 MG tablet Take 4 mg by mouth at bedtime.   Yes [provider]  Tamsulosin HCl (FLOMAX) 0.4 MG CAPS Take 0.4 mg by mouth daily after breakfast.   Yes [provider]  traMADol (ULTRAM) 50 MG tablet Take 100 mg by mouth 2 (two) times daily.    Yes [provider]  triamcinolone cream (KENALOG) 0.1 % Apply 1 application topically 2 (two) times daily.  05/01/18  Yes [provider]  guaiFENesin (MUCINEX) 600 MG 12 hr tablet Take 1 tablet (600 mg total) by mouth 2  (two) times daily as needed for cough. Patient not taking: Reported on 05/03/2018 01/17/18   Rai, Vernelle Emerald, MD  Magnesium 200 MG TABS Take 1 tablet (200 mg total) by mouth daily. Patient not taking: Reported on 05/03/2018 03/08/18   Lendon Colonel, NP  OXYGEN Inhale 2 L into the lungs daily.    [provider]  Past Surgical History Past Surgical History:  Procedure Laterality Date  . APPENDECTOMY  12/2000   [name] 12/22/2010  . BIV UPGRADE N/A 02/22/2018   Procedure: BIV UPGRADE;  Surgeon: [name], MD;  Location: [facility];  Service: Cardiovascular;  Laterality: N/A;  . BIV UPGRADE N/A 03/29/2018   Procedure: BIV PPM UPGRADE;  Surgeon: [name], MD;  Location: [facility];  Service: Cardiovascular;  Laterality: N/A;  . CARDIAC CATHETERIZATION  11/07/2008   nonischemic cardiomyopathy,pulmonary hypertension  . CATARACT EXTRACTION W/ INTRAOCULAR LENS  IMPLANT, BILATERAL Bilateral   . CIRCUMCISION  12/2005   [name] 12/22/2010  . COLOSTOMY  12/2000   [name] 12/22/2010  . COLOSTOMY REVERSAL  07/2001   [name] 12/22/2010  . CORONARY ANGIOPLASTY  06/01/1999   successful to ostium of the first diagonal  . ESOPHAGOGASTRODUODENOSCOPY N/A 08/22/2013   Procedure: ESOPHAGOGASTRODUODENOSCOPY (EGD);  Surgeon: [name], MD;  Location: [facility] ENDOSCOPY;  Service: Endoscopy;  Laterality: N/A;  h/p in file cabinet, jackie  . ESOPHAGOGASTRODUODENOSCOPY N/A 11/05/2014   Procedure: ESOPHAGOGASTRODUODENOSCOPY (EGD);  Surgeon: Clarene Essex, MD;  Location: [facility] END[facility]COPY;  Service: Endoscopy;  Laterality: N/A;  . ESOPHAGOGASTRODUODENOSCOPY N/A 11/08/2016   Procedure: ESOPHAGOGASTRODUODENOSCOPY (EGD);  Surgeon: [name], MD;  Location: [facility] ENDOSCOPY;  Service: Endoscopy;  Laterality: N/A;  . ESOPHAGOGASTRODUODENOSCOPY (EGD) WITH PROPOFOL Left 11/05/2017     Procedure: ESOPHAGOGASTRODUODENOSCOPY (EGD) WITH PROPOFOL;  Surgeon: [name], MD;  Location: [facility];  Service: Endoscopy;  Laterality: Left;  . HOT HEMOSTASIS N/A 11/05/2014   Procedure: HOT HEMOSTASIS (ARGON PLASMA COAGULATION/BICAP);  Surgeon: Clarene Essex, MD;  Location: [facility] ENDOSCOPY;  Service: Endoscopy;  Laterality: N/A;  . INSERT / REPLACE / REMOVE PACEMAKER    . JOINT REPLACEMENT    . MASS EXCISION Left    hand w/ulnar artery reconstruction/notes 12/22/2010  . NM MYOCAR PERF WALL MOTION  11/24/2007   normal  . PERMANENT PACEMAKER INSERTION  10/04/2012   [employer]  . PERMANENT PACEMAKER INSERTION N/A 10/12/2011   Procedure: PERMANENT PACEMAKER INSERTION;  Surgeon: [name], MD;  Location: [facility] CATH LAB;  Service: Cardiovascular;  Laterality: N/A;  . REPLACEMENT TOTAL KNEE Bilateral 11/2006   right-left/notes 12/22/2010  . ROTATOR CUFF REPAIR Right 09/2004   [name] 12/22/2010  . SAVORY DILATION N/A 08/22/2013   Procedure: SAVORY DILATION;  Surgeon: [name], MD;  Location: [facility] ENDOSCOPY;  Service: Endoscopy;  Laterality: N/A;  . SHOULDER SURGERY Right    "fell off house; messed up 3 things in my arm"  . TONSILLECTOMY    . US ECHOCARDIOGRAPHY  02/01/2011   LA is mod-severely dilated,AOV & root sclerotic,ca+ AOV leaflets   Family History Family History  Problem Relation Age of Onset  . Cancer Mother   . Heart attack Father   . Diabetes Brother     Social History Social History   Tobacco Use  . Smoking status: Former Research scientist (life sciences)  . Smokeless tobacco: Never Used  . Tobacco comment: 09/30/2017 "it's been over 70yr since I smoked anything"  Substance Use Topics  . Alcohol use: No  . Drug use: No   Allergies Vicodin [hydrocodone-acetaminophen]  Review of Systems Review of Systems All other systems are reviewed and are negative for acute change except as noted in the HPI  Physical Exam Vital Signs  I have reviewed the triage vital signs BP 115/80    Pulse 75   Temp 97.9 F (36.6 C) (Oral)   Resp (!) 21  Ht 5\' 6"  (1.676 m)   Wt 88.9 kg   SpO2 97%   BMI 31.64 kg/m   Physical Exam  Constitutional: He is oriented to person, place, and time. He appears well-developed and well-nourished. No distress.  HENT:  Head: Normocephalic and atraumatic.  Nose: Nose normal.  Eyes: Pupils are equal, round, and reactive to light. Conjunctivae and EOM are normal. Right eye exhibits no discharge. Left eye exhibits no discharge. No scleral icterus.  Neck: Normal range of motion. Neck supple.  Cardiovascular: Normal rate and regular rhythm. Exam reveals no gallop and no friction rub.  No murmur heard. Pulmonary/Chest: Effort normal. No stridor. No respiratory distress. He has rales ( Fine bibasilar rales).  Abdominal: Soft. He exhibits distension. There is no tenderness.  Genitourinary:  Genitourinary Comments: Scrotal edema   Musculoskeletal: He exhibits no edema or tenderness.  1+ bilateral lower extremity pitting edema  Neurological: He is alert and oriented to person, place, and time.  Skin: Skin is warm and dry. Petechiae ( To bilateral lower extremities up to mid shin) noted. No rash noted. He is not diaphoretic. No erythema.  Dispersed dry macular rashes throughout the torso  Psychiatric: He has a normal mood and affect.  Vitals reviewed.   ED Results and Treatments Labs (all labs ordered are listed, but only abnormal results are displayed) Labs Reviewed  BASIC METABOLIC PANEL - Abnormal; Notable for the following components:      Result Value   Chloride 96 (*)    BUN 70 (*)    Creatinine, Ser 1.94 (*)    GFR calc non Af Amer 30 (*)    GFR calc Af Amer 35 (*)    All other components within normal limits  CBC - Abnormal; Notable for the following components:   WBC 3.2 (*)    RBC 2.37 (*)    Hemoglobin 8.7 (*)    HCT 27.1 (*)    MCV 114.3 (*)    MCH 36.7 (*)    RDW 17.4 (*)    Platelets 118 (*)    All other components within  normal limits  BRAIN NATRIURETIC PEPTIDE - Abnormal; Notable for the following components:   B Natriuretic Peptide 1,865.0 (*)    All other components within normal limits  I-STAT TROPONIN, ED                                                                                                                         EKG  EKG Interpretation  Date/Time:  Wednesday May 03 2018 03:53:06 EDT Ventricular Rate:  71 PR Interval:    QRS Duration: 162 QT Interval:  464 QTC Calculation: 504 R Axis:   154 Text Interpretation:  ** Poor data quality, interpretation may be adversely affected ** Suspect unspecified pacemaker failure Ventricular-paced rhythm Abnormal ECG Confirmed by Addison Lank [phone]) on 05/03/2018 5:35:14 AM      Radiology Dg Chest 2 View  Result Date: 05/03/2018 CLINICAL DATA:  84 year old male  with CHF. EXAM: CHEST - 2 VIEW COMPARISON:  Chest radiograph dated 03/30/2018 FINDINGS: There is cardiomegaly with mild vascular congestion and probable mild edema. No focal consolidation, pleural effusion, or pneumothorax. There is atherosclerotic calcification of the aorta. The aorta is tortuous. Left pectoral pacemaker device. Osteopenia with degenerative changes of the spine. No acute osseous pathology. IMPRESSION: Cardiomegaly with mild vascular congestion and probable mild interstitial edema. Electronically Signed   By: Anner Crete M.D.   On: 05/03/2018 04:53   Pertinent labs & imaging results that were available during my care of the patient were reviewed by me and considered in my medical decision making (see chart for details).  Medications Ordered in ED Medications  furosemide (LASIX) injection 80 mg (80 mg Intravenous Given 05/03/18 0640)  hydrOXYzine (ATARAX/VISTARIL) tablet 25 mg (25 mg Oral Given 05/03/18 7035)                                                                                                                                    Procedures Procedures    EMERGENCY DEPARTMENT [place] CARDIAC EXAM "Study: Limited Ultrasound of the Heart and Pericardium"  INDICATIONS:Dyspnea Multiple views of the heart and pericardium were obtained in real-time with a multi-frequency probe.  PERFORMED KK:XFGHWE IMAGES ARCHIVED?: Yes LIMITATIONS:  Body habitus VIEWS USED: Parasternal long axis, Parasternal short axis and Apical 4 chamber  INTERPRETATION: Cardiac activity present, Pericardial effusioin absent and Decreased contractility   (including critical care time)   CRITICAL CARE Performed by: Grayce Sessions Ragen Laver Total critical care time: 30 minutes Critical care time was exclusive of separately billable procedures and treating other patients. Critical care was necessary to treat or prevent imminent or life-threatening deterioration. Critical care was time spent personally by me on the following activities: development of treatment plan with patient and/or surrogate as well as nursing, discussions with consultants, evaluation of patient's response to treatment, examination of patient, obtaining history from patient or surrogate, ordering and performing treatments and interventions, ordering and review of laboratory studies, ordering and review of radiographic studies, pulse oximetry and re-evaluation of patient's condition.   Medical Decision Making / ED Course I have reviewed the nursing notes for this encounter and the patient's prior records (if available in EHR or on provided paperwork).    Work-up is consistent with CHF exacerbation.  Chest x-ray with evidence of pulmonary vascular congestion and pulmonary edema.  Patient satting in the high 80s on 2 L nasal cannula.  Improved with increased to 3L.  EKG showed paced rhythm.  Troponin negative.  BNP elevated greater than previous.  Worsening renal function.   Given recent instrumentation and left ventricular lead placement, bedside echo was performed and did not reveal evidence of a pericardial  effusion.   Dose of IV Lasix was placed.  Work-up also notable for decreased platelet count from 140s to 118.  Hemoglobin close to patient's baseline.  Will need admission for continued diuresing.  Final Clinical Impression(s) / ED Diagnoses Final diagnoses:  Acute on chronic systolic congestive heart failure ([employer])      This chart was dictated using voice recognition software.  Despite best efforts to proofread,  errors can occur which can change the documentation meaning.   Fatima Blank, MD 05/03/18 0347    Fatima Blank, MD 05/12/18 [phone]

## 2018-05-03 NOTE — ED Notes (Signed)
Patient is resting comfortably. 

## 2018-05-03 NOTE — ED Triage Notes (Signed)
Pt has hx of CHF. Pt reports pain to feet and legs from swelling. Denies sob. Pt wears 2L [place] baseline but had to be bumped up to 3L. Pt has wounds over body and has been on doxycycline for possible mrsa infection. Pt has a pacemaker.

## 2018-05-03 NOTE — Progress Notes (Signed)
RT brought pt CPAP machine, pt stated he wasn't ready to go on it at this time. RT explained to pt that when he is ready to let the nurse know and they will call us. RT will continue to monitor as needed.

## 2018-05-03 NOTE — ED Notes (Signed)
Pt resting and appears comfortable. Family at bedside tired. RN encouraged them to get some rest.

## 2018-05-03 NOTE — Consult Note (Addendum)
Cardiology Consultation:   Patient ID: Anthony MACGREGOR Sr.; [id number]; Feb 16, 1932   Admit date: 05/03/2018 Date of Consult: 05/03/2018  Primary Care Provider: [name], MD Primary Cardiologist: Dr. [name]  Primary Electrophysiologist: Dr. Virl Axe   Patient Profile:   Anthony MITTMAN Sr. is a 84 y.o. male with a hx of chronic systolic heart failure with an EF of 25%, nonischemic cardiomyopathy (per cath 2010), pulmonary hypertension, RLS, OSA on CPAP, severe tricuspid regurgitation, permanent atrial fibrillation with slow ventricular response not on anticoagulation, single-chamber pacemaker in situ with recent upgrade to CRT-P on 03/29/2018 per Dr. [name] and hypertension who is being seen today for the evaluation of acute CHF exacerbation at the request of Dr. Lorin Mercy.   History of Present Illness:   Mr. Beeks is an 84 year old male with a history as stated above who presented to [facility] on 05/03/2018 with complaints of scrotal edema for several days as well as increased SOB and abdominal edema. He was recently on an 8 day cruise with his family and had multiple diet indiscretions over that period. His weight was noted to have increased from 179lb to 191lb since last hospital discharge on 03/30/18. At baseline, he wears home supplemental oxygen at 2 L for COPD. HPI difficult to obtain given increased somnolence secondary to medication administration with hydroxyzine for RLS. He denies chest pain, palpitations, LE swelling however endorses increased abdominal swelling. Denies orthopnea, PND more than his baseline, dizziness, syncope or presyncopal episodes.   In the ED, CXR revealed cadrdiomegaly with mild vascular congestion and probable mild interstitial edema.  Markedly elevated BNP at 1865.  I-STAT troponin noted to be mildly elevated at 0.03 however he has had no anginal symptoms. Creatinine markedly elevated at 1.94, baseline appears to be in the 1.4 range however there is evidence  of acute kidney injury dating back to 2018 with creatinine levels of greater than 3.0.  Pt recently underwent BiV pacer upgrade on 03/29/18 secondary to worsening LV function in the setting of increased RV pacing requirements. Last echocardiogram EF noted to be 25-30% with moderate TR and increased pulmonary hypertension with PA pressures of 22mmHg on 01/13/18. Bedside ultrasound completed without evidence of pericardial effusion in the ED.  Cardiology has been asked to evaluate secondary to history of LV dysfunction and recent BiV upgrade.   Past Medical History:  Diagnosis Date  . Arthritis    "feet" (09/30/2017)  . CHF (congestive heart failure) ([place])   . Chronic bronchitis ([place])   . CKD (chronic kidney disease), stage III ([place])    [name] 09/30/2017  . Coronary artery disease   . Diverticulitis   . Dyspnea   . GERD (gastroesophageal reflux disease)   . Gout   . History of blood transfusion    "blood loss" (09/30/2017)  . Hyperlipidemia   . Hypertension   . MI (myocardial infarction) ([place]) ~ 2000   "light one"  . Nonischemic cardiomyopathy (HCC)    mild  . OSA on CPAP   . Pacemaker single chamber, [place], 2013 10/13/2011  . Permanent atrial fibrillation (HCC)    on Eliquis  . Presence of permanent cardiac pacemaker   . Pulmonary hypertension ([place])   . Restless legs     Past Surgical History:  Procedure Laterality Date  . APPENDECTOMY  12/2000   [name] 12/22/2010  . BIV UPGRADE N/A 02/22/2018   Procedure: BIV UPGRADE;  Surgeon: [name], MD;  Location: [place];  Service:  Cardiovascular;  Laterality: N/A;  . BIV UPGRADE N/A 03/29/2018   Procedure: BIV PPM UPGRADE;  Surgeon: [name], MD;  Location: [place] [facility];  Service: Cardiovascular;  Laterality: N/A;  . CARDIAC CATHETERIZATION  11/07/2008   nonischemic cardiomyopathy,pulmonary hypertension  . CATARACT EXTRACTION W/ INTRAOCULAR LENS  IMPLANT, BILATERAL Bilateral   .  CIRCUMCISION  12/2005   [name] 12/22/2010  . COLOSTOMY  12/2000   [name] 12/22/2010  . COLOSTOMY REVERSAL  07/2001   [name] 12/22/2010  . CORONARY ANGIOPLASTY  06/01/1999   successful to ostium of the first diagonal  . ESOPHAGOGASTRODUODENOSCOPY N/A 08/22/2013   Procedure: ESOPHAGOGASTRODUODENOSCOPY (EGD);  Surgeon: [name], MD;  Location: [facility] ENDOSCOPY;  Service: Endoscopy;  Laterality: N/A;  h/p in file cabinet, jackie  . ESOPHAGOGASTRODUODENOSCOPY N/A 11/05/2014   Procedure: ESOPHAGOGASTRODUODENOSCOPY (EGD);  Surgeon: Clarene Essex, MD;  Location[facility] ENDOSCOPY;  Service: Endoscopy;  Laterality: N/A;  . ESOPHAGOGASTRODUODENOSCOPY N/A 11/08/2016   Procedure: ESOPHAGOGASTRODUODENOSCOPY (EGD);  Surgeon: [name], MD;  Location: [facility] ENDOSCOPY;  Service: Endoscopy;  Laterality: N/A;  . ESOPHAGOGASTRODUODENOSCOPY (EGD) WITH PROPOFOL Left 11/05/2017   Procedure: ESOPHAGOGASTRODUODENOSCOPY (EGD) WITH PROPOFOL;  Surgeon: [name], MD;  Location: [facility];  Service: Endoscopy;  Laterality: Left;  . HOT HEMOSTASIS N/A 11/05/2014   Procedure: HOT HEMOSTASIS (ARGON PLASMA COAGULATION/BICAP);  Surgeon: Clarene Essex, MD;  Location: [facility] ENDOSCOPY;  Service: Endoscopy;  Laterality: N/A;  . INSERT / REPLACE / REMOVE PACEMAKER    . JOINT REPLACEMENT    . MASS EXCISION Left    hand w/ulnar artery reconstruction/notes 12/22/2010  . NM MYOCAR PERF WALL MOTION  11/24/2007   normal  . PERMANENT PACEMAKER INSERTION  10/04/2012   [employer]  . PERMANENT PACEMAKER INSERTION N/A 10/12/2011   Procedure: PERMANENT PACEMAKER INSERTION;  Surgeon: [name], MD;  Location: [facility] CATH LAB;  Service: Cardiovascular;  Laterality: N/A;  . REPLACEMENT TOTAL KNEE Bilateral 11/2006   right-left/notes 12/22/2010  . ROTATOR CUFF REPAIR Right 09/2004   [name] 12/22/2010  . SAVORY DILATION N/A 08/22/2013   Procedure: SAVORY DILATION;  Surgeon: [name], MD;  Location: [facility] ENDOSCOPY;  Service: Endoscopy;   Laterality: N/A;  . SHOULDER SURGERY Right    "fell off house; messed up 3 things in my arm"  . TONSILLECTOMY    . US ECHOCARDIOGRAPHY  02/01/2011   LA is mod-severely dilated,AOV & root sclerotic,ca+ AOV leaflets     Prior to Admission medications   Medication Sig Start Date End Date Taking? Authorizing Provider  allopurinol (ZYLOPRIM) 100 MG tablet Take 2 tablets (200 mg total) by mouth daily. 11/09/16  Yes Theodis Blaze, MD  dutasteride (AVODART) 0.5 MG capsule Take 0.5 mg by mouth daily.   Yes [provider]  escitalopram (LEXAPRO) 5 MG tablet Take 1 tablet (5 mg total) by mouth at bedtime. 11/09/16  Yes Theodis Blaze, MD  furosemide (LASIX) 80 MG tablet Take 1 tablet (80 mg total) by mouth 2 (two) times daily. 01/25/18  Yes Lendon Colonel, NP  hydrOXYzine (ATARAX/VISTARIL) 25 MG tablet TAKE 1 TABLET BY MOUTH EVERY 6 HOURS AS NEEDED FOR  ITCHING Patient taking differently: Take 25 mg by mouth every 6 (six) hours as needed for itching.  01/11/18  Yes Croitoru, Mihai, MD  mirtazapine (REMERON) 15 MG tablet Take 15 mg by mouth at bedtime.   Yes [provider]  Multiple Vitamin (MULTIVITAMIN WITH MINERALS) TABS tablet Take 1 tablet by mouth daily.   Yes [provider]  Multiple Vitamins-Minerals (PRESERVISION AREDS 2) CAPS Take 1 capsule by mouth daily.   Yes [provider]  mupirocin ointment (BACTROBAN) 2 % Apply 1 application topically 2 (two) times daily. To toe 05/01/18  Yes [provider]  pantoprazole (PROTONIX) 40 MG tablet Take 1 tablet (40 mg total) by mouth daily. 11/08/17  Yes Nita Sells, MD  potassium chloride (K-DUR) 10 MEQ tablet Take 10 mEq by mouth 2 (two) times daily.   Yes [provider]  rOPINIRole (REQUIP) 4 MG tablet Take 4 mg by mouth at bedtime.   Yes [provider]  Tamsulosin HCl (FLOMAX) 0.4 MG CAPS Take 0.4 mg by mouth daily after breakfast.   Yes [provider]  traMADol (ULTRAM)  50 MG tablet Take 100 mg by mouth 2 (two) times daily.    Yes [provider]  triamcinolone cream (KENALOG) 0.1 % Apply 1 application topically 2 (two) times daily.  05/01/18  Yes [provider]  OXYGEN Inhale 2 L into the lungs daily.    [provider]    Inpatient Medications: Scheduled Meds: . allopurinol  200 mg Oral Daily  . aspirin EC  81 mg Oral Daily  . dutasteride  0.5 mg Oral Daily  . enoxaparin (LOVENOX) injection  30 mg Subcutaneous Q24H  . escitalopram  5 mg Oral QHS  . furosemide  80 mg Intravenous Q12H  . [START ON 05/04/2018] Influenza vac split quadrivalent PF  0.5 mL Intramuscular Tomorrow-1000  . mirtazapine  15 mg Oral QHS  . multivitamin-lutein  1 capsule Oral Daily  . pantoprazole  40 mg Oral Daily  . rOPINIRole  4 mg Oral QHS  . sodium chloride flush  3 mL Intravenous Q12H  . tamsulosin  0.4 mg Oral QPC breakfast  . traMADol  100 mg Oral BID   Continuous Infusions: . sodium chloride     PRN Meds: sodium chloride, acetaminophen, hydrOXYzine, ondansetron (ZOFRAN) IV, sodium chloride flush  Allergies:    Allergies  Allergen Reactions  . Vicodin [Hydrocodone-Acetaminophen] Itching and Nausea Only    Social History:   Social History   Socioeconomic History  . Marital status: Married    Spouse name: Not on file  . Number of children: Not on file  . Years of education: Not on file  . Highest education level: Not on file  Occupational History  . Occupation: retired  Scientific laboratory technician  . Financial resource strain: Not on file  . Food insecurity:    Worry: Not on file    Inability: Not on file  . Transportation needs:    Medical: Not on file    Non-medical: Not on file  Tobacco Use  . Smoking status: Former Research scientist (life sciences)  . Smokeless tobacco: Never Used  . Tobacco comment: 09/30/2017 "it's been over 63yr since I smoked anything"  Substance and Sexual Activity  . Alcohol use: No  . Drug use: No  . Sexual activity: Not Currently    Lifestyle  . Physical activity:    Days per week: Not on file    Minutes per session: Not on file  . Stress: Not on file  Relationships  . Social connections:    Talks on phone: Not on file    Gets together: Not on file    Attends religious service: Not on file    Active member of club or organization: Not on file    Attends meetings of clubs or organizations: Not on file  Relationship status: Not on file  . Intimate partner violence:    Fear of current or ex partner: Not on file    Emotionally abused: Not on file    Physically abused: Not on file    Forced sexual activity: Not on file  Other Topics Concern  . Not on file  Social History Narrative  . Not on file    Family History:   Family History  Problem Relation Age of Onset  . Cancer Mother   . Heart attack Father   . Diabetes Brother    Family Status:  Family Status  Relation Name Status  . Mother  Deceased  . Father  Deceased  . Brother  Alive  . Brother  Alive  . Brother  (Not Specified)    ROS:  Please see the history of present illness.  All other ROS reviewed and negative.     Physical Exam/Data:   Vitals:   05/03/18 1130 05/03/18 1200 05/03/18 1230 05/03/18 1318  BP: 116/68 117/68 117/73 112/71  Pulse: 69 70 69 69  Resp: 16 20 (!) 21 18  Temp:    (!) 97.4 F (36.3 C)  TempSrc:    Oral  SpO2: 96% 98% 100% 99%  Weight:    87.8 kg  Height:    5\' 7"  (1.702 m)    Intake/Output Summary (Last 24 hours) at 05/03/2018 1618 Last data filed at 05/03/2018 1340 Gross per 24 hour  Intake 240 ml  Output 900 ml  Net -660 ml   Filed Weights   05/03/18 0349 05/03/18 1318  Weight: 88.9 kg 87.8 kg   Body mass index is 30.31 kg/m.   General: Elderly,  NAD Skin: Warm, dry, intact  Head: Normocephalic, atraumatic, clear, moist mucus membranes. Neck: Negative for carotid bruits. No JVD Lungs: Bilateral crackles in upper and lower lobes.  No wheezes. Breathing is unlabored on [place] 2L Cardiovascular:  Irregularly irregular with S1 S2. No murmurs, rubs, gallops, or LV heave appreciated. Abdomen: Soft, non-tender, non-distended with normoactive bowel sounds. No obvious abdominal masses. MSK: Strength and tone appear normal for age. 5/5 in all extremities Extremities: No edema. No clubbing or cyanosis. DP/PT pulses 1+ bilaterally Neuro: Somnolent but oriented. No focal deficits. No facial asymmetry. MAE spontaneously. Psych: Responds to questions somewhat appropriately with normal affect.    EKG:  The EKG was personally reviewed and demonstrates: NSR with HR 71, poor quality.  See tracing Telemetry:  Telemetry was personally reviewed and demonstrates: 05/03/18 V-paced   Relevant CV Studies:  ECHO: 01/13/2018: Study Conclusions  - Left ventricle: The cavity size was normal. Systolic function was   severely reduced. The estimated ejection fraction was in the   range of 25% to 30%. There is akinesis of the   mid-apicalanteroseptal and inferoseptal myocardium. The study is   not technically sufficient to allow evaluation of LV diastolic   function. - Aortic valve: Trileaflet; moderately thickened, moderately   calcified leaflets. Transvalvular velocity was minimally   increased. There was no stenosis. There was mild regurgitation.   Peak velocity (S): 233 cm/s. Valve area (VTI): 1.49 cm^2. Valve   area (Vmax): 1.6 cm^2. Valve area (Vmean): 1.44 cm^2. - Mitral valve: There was mild regurgitation. - Left atrium: The atrium was severely dilated. - Right ventricle: The cavity size was moderately dilated. Wall   thickness was normal. Pacer wire or catheter noted in right   ventricle. - Right atrium: The atrium was severely dilated. - Tricuspid valve: There was  moderate regurgitation. - Pulmonary arteries: Systolic pressure was moderately increased.   PA peak pressure: 53 mm Hg (S).  CATH: None  Laboratory Data:  Chemistry Recent Labs  Lab 05/03/18 0406  NA 139  K 4.0  CL 96*  CO2  32  GLUCOSE 97  BUN 70*  CREATININE 1.94*  CALCIUM 9.0  GFRNONAA 30*  GFRAA 35*  ANIONGAP 11    Total Protein  Date Value Ref Range Status  01/14/2018 6.4 (L) 6.5 - 8.1 g/dL Final   Albumin  Date Value Ref Range Status  01/14/2018 2.9 (L) 3.5 - 5.0 g/dL Final   AST  Date Value Ref Range Status  01/14/2018 75 (H) 15 - 41 U/L Final   ALT  Date Value Ref Range Status  01/14/2018 104 (H) 17 - 63 U/L Final   Alkaline Phosphatase  Date Value Ref Range Status  01/14/2018 90 38 - 126 U/L Final   Total Bilirubin  Date Value Ref Range Status  01/14/2018 0.8 0.3 - 1.2 mg/dL Final   Hematology Recent Labs  Lab 05/03/18 0406  WBC 3.2*  RBC 2.37*  HGB 8.7*  HCT 27.1*  MCV 114.3*  MCH 36.7*  MCHC 32.1  RDW 17.4*  PLT 118*   Cardiac EnzymesNo results for input(s): TROPONINI in the last 168 hours.  Recent Labs  Lab 05/03/18 0412  TROPIPOC 0.03    BNP Recent Labs  Lab 05/03/18 0406  BNP 1,865.0*    DDimer No results for input(s): DDIMER in the last 168 hours. TSH:  Lab Results  Component Value Date   TSH 3.099 11/03/2017   Lipids: Lab Results  Component Value Date   CHOL 117 11/04/2017   HDL 41 11/04/2017   LDLCALC 64 11/04/2017   TRIG 61 11/04/2017   CHOLHDL 2.9 11/04/2017   HgbA1c: Lab Results  Component Value Date   HGBA1C 5.2 01/12/2018    Radiology/Studies:  Dg Chest 2 View  Result Date: 05/03/2018 CLINICAL DATA:  84 year old male with CHF. EXAM: CHEST - 2 VIEW COMPARISON:  Chest radiograph dated 03/30/2018 FINDINGS: There is cardiomegaly with mild vascular congestion and probable mild edema. No focal consolidation, pleural effusion, or pneumothorax. There is atherosclerotic calcification of the aorta. The aorta is tortuous. Left pectoral pacemaker device. Osteopenia with degenerative changes of the spine. No acute osseous pathology. IMPRESSION: Cardiomegaly with mild vascular congestion and probable mild interstitial edema. Electronically Signed    By: Anner Crete M.D.   On: 05/03/2018 04:53   Assessment and Plan:   1.  Acute on chronic tori failure with hypoxia in the setting of acute on chronic CHF exacerbation: -Patient with a known nonischemic cardiomyopathy with LVEF of 25 to 30% with akinesis of mid-apicalanteroseptal and inferoseptal myocardium performed 01/13/2018 with recent BiV pacemaker upgrade on 03/30/2018 per Dr. [name] (initially scheduled 02/2018 however was canceled secondary to inability to control restless leg syndrome). Post CXR on 03/30/18 demonstrated no pneumothorax s/p implantation. Bedside US in ED with no pericardial effusion noted.   -Patient presented with worsening scrotal edema, shortness of breath, abdominal edema and weight gain over the course of the last several weeks, more prominent after returning from 8 day cruise on Sunday [place] in the setting multiple diet indiscretions -Weight has increased from 178lb to 193lb since last hospital discharge on 03/30/2018 -CXR on arrival with evidence of mild vascular congestion and interstitial edema with BNP of 1865 -Troponin, 0.03 likely in the setting of fluid volume overload without anginal symptoms>>continue to trend  -  Patient given IV Lasix 80 mg x1 in ER with repeat of 80 mg IV twice daily for fluid volume management -I&O, net -660 mL -Continue strict I&O, daily weights -Continue ASA, IV Lasix 80 mg twice daily and monitor renal function closely  2. Chronic kidney disease stage III: -Creatinine, 1.94 today -Baseline appears to be in the 1.4 range -Need to avoid nephrotoxic medications -Follow with daily BMET   3. Permanent atrial fibrillation: -With slow ventricular response, typically in the 40s to 50s with pacemaker interrogation and initially showed ventricular pacing occurring 73% of the time.  Given increased ventricular demand and further decrease in LV function, patient upgraded to BiV pacer by 03/2018 -No anticoagulation secondary to history of  bleeding issues -Last device check 04/12/2018, normal function   4.  Nonischemic cardiomyopathy rapidly declining LVEF requiring frequent hospitalizations: -Known LVEF of 25 to 30% with recent ICD upgrade for Dr. [name] -Last cardiac catheterization 11/07/2008 with nonischemic cardiomyopathy with LVEF measured at 40 to 45% with pulmonary hypertension with pulmonary artery pressure of 43/21, chronic atrial fibrillation and moderate to severe obstructive sleep apnea with uncontrollable leg movement. -Continue ASA  5.  History of COPD:  -On home oxygen supplementation -No beta-blocker secondary to above -Per primary team  For questions or updates, please contact [place] Please consult [email or web address] for contact info under Cardiology/STEMI.   Lyndel Safe NP-C [facility] Pager: [phone] 05/03/2018 4:18 PM  Personally seen and examined. Agree with above.  84 year old with chronic systolic heart failure EF 25%, recent CRT P upgrade on 03/29/2018, Dr. [name], recent cruise to the [place] here with acute systolic heart failure.  Laying comfortably in bed currently, trying to sleep.  Restless legs, scratching.  GEN: Well nourished, well developed, in no acute distress, elderly  HEENT: normal O2 Neck: no JVD, carotid bruits, or masses Cardiac: RRR; no murmurs, rubs, or gallops, trivial edema, ICD Respiratory:  clear to auscultation bilaterally, normal work of breathing GI: soft, nontender, nondistended, + BS MS: no deformity or atrophy  Skin: warm and dry, no rash Neuro:  Alert and Oriented x 3, Strength and sensation are intact Psych: euthymic mood, full affect  Echo EF 25 to 30%  Assessment and plan:  Acute on chronic systolic heart failure, respiratory failure - Weight up from 1 78-1 93 - Continue with Lasix 80 mg IV twice a day, increasing if necessary.  He is not currently on beta-blocker because of prior hypotension.  He is not on ACE inhibitor because of chronic  kidney disease. -Nonischemic cardiomyopathy  Chronic kidney disease stage III -Creatinine 1.94.  Baseline in the 1.4 range.  Permanent atrial fibrillation - Pacing.  Doing well.  No anticoagulation because of prior bleeding issues.  COPD - Supplemental oxygen, no beta-blocker  We will continue to monitor.  Main suggestion is continue with IV Lasix.  Candee Furbish, MD

## 2018-05-04 ENCOUNTER — Inpatient Hospital Stay (HOSPITAL_COMMUNITY): Payer: Medicare Other

## 2018-05-04 DIAGNOSIS — J9621 Acute and chronic respiratory failure with hypoxia: Secondary | ICD-10-CM

## 2018-05-04 LAB — BLOOD GAS, ARTERIAL
Acid-Base Excess: 7.7 mmol/L — ABNORMAL HIGH (ref 0.0–2.0)
Acid-Base Excess: 7.2 mmol/L — ABNORMAL HIGH (ref 0.0–2.0)
BICARBONATE: 33.5 mmol/L — AB (ref 20.0–28.0)
Bicarbonate: 34.4 mmol/L — ABNORMAL HIGH (ref 20.0–28.0)
DRAWN BY: 331001
Delivery systems: POSITIVE
Drawn by: 331001
Expiratory PAP: 6
FIO2: 30
Inspiratory PAP: 14
O2 Content: 3 L/min
O2 SAT: 94.4 %
O2 Saturation: 93.9 %
PATIENT TEMPERATURE: 97.6
PCO2 ART: 74 mmHg — AB (ref 32.0–48.0)
PO2 ART: 78 mmHg — AB (ref 83.0–108.0)
Patient temperature: 97.6
pCO2 arterial: 68.7 mmHg (ref 32.0–48.0)
pH, Arterial: 7.285 — ABNORMAL LOW (ref 7.350–7.450)
pH, Arterial: 7.305 — ABNORMAL LOW (ref 7.350–7.450)
pO2, Arterial: 76.1 mmHg — ABNORMAL LOW (ref 83.0–108.0)

## 2018-05-04 LAB — BASIC METABOLIC PANEL
ANION GAP: 9 (ref 5–15)
BUN: 71 mg/dL — ABNORMAL HIGH (ref 8–23)
CALCIUM: 8.8 mg/dL — AB (ref 8.9–10.3)
CHLORIDE: 100 mmol/L (ref 98–111)
CO2: 31 mmol/L (ref 22–32)
Creatinine, Ser: 1.95 mg/dL — ABNORMAL HIGH (ref 0.61–1.24)
GFR calc Af Amer: 35 mL/min — ABNORMAL LOW (ref >60)
GFR, EST NON AFRICAN AMERICAN: 30 mL/min — AB (ref >60)
GLUCOSE: 109 mg/dL — AB (ref 70–99)
Potassium: 4.2 mmol/L (ref 3.5–5.1)
Sodium: 140 mmol/L (ref 135–145)

## 2018-05-04 LAB — CBC WITH DIFFERENTIAL/PLATELET
BASOS PCT: 1 %
Basophils Absolute: 0 10*3/uL (ref 0.0–0.1)
EOS PCT: 2 %
Eosinophils Absolute: 0.1 10*3/uL (ref 0.0–0.7)
HEMATOCRIT: 28.4 % — AB (ref 39.0–52.0)
HEMOGLOBIN: 8.7 g/dL — AB (ref 13.0–17.0)
LYMPHS PCT: 17 %
Lymphs Abs: 0.6 10*3/uL — ABNORMAL LOW (ref 0.7–4.0)
MCH: 35.7 pg — AB (ref 26.0–34.0)
MCHC: 30.6 g/dL (ref 30.0–36.0)
MCV: 116.4 fL — AB (ref 78.0–100.0)
MONO ABS: 0.4 10*3/uL (ref 0.1–1.0)
MONOS PCT: 13 %
NEUTROS PCT: 67 %
Neutro Abs: 2.3 10*3/uL (ref 1.7–7.7)
Platelets: 113 10*3/uL — ABNORMAL LOW (ref 150–400)
RBC: 2.44 MIL/uL — AB (ref 4.22–5.81)
RDW: 17.5 % — ABNORMAL HIGH (ref 11.5–15.5)
WBC: 3.4 10*3/uL — ABNORMAL LOW (ref 4.0–10.5)

## 2018-05-04 LAB — IRON AND TIBC
IRON: 39 ug/dL — AB (ref 45–182)
Saturation Ratios: 10 % — ABNORMAL LOW (ref 17.9–39.5)
TIBC: 377 ug/dL (ref 250–450)
UIBC: 338 ug/dL

## 2018-05-04 LAB — MRSA PCR SCREENING: MRSA by PCR: NEGATIVE

## 2018-05-04 LAB — AMMONIA: Ammonia: 42 umol/L — ABNORMAL HIGH (ref 9–35)

## 2018-05-04 LAB — GLUCOSE, CAPILLARY: Glucose-Capillary: 139 mg/dL — ABNORMAL HIGH (ref 70–99)

## 2018-05-04 LAB — LACTATE DEHYDROGENASE: LDH: 313 U/L — ABNORMAL HIGH (ref 98–192)

## 2018-05-04 LAB — VITAMIN B12: VITAMIN B 12: 861 pg/mL (ref 180–914)

## 2018-05-04 LAB — FERRITIN: Ferritin: 36 ng/mL (ref 24–336)

## 2018-05-04 MED ORDER — FUROSEMIDE 10 MG/ML IJ SOLN
80.0000 mg | Freq: Three times a day (TID) | INTRAMUSCULAR | Status: DC
  Administered 2018-05-04 – 2018-05-06 (×7): 80 mg via INTRAVENOUS
  Filled 2018-05-04 (×7): qty 8

## 2018-05-04 MED ORDER — LACTULOSE 10 GM/15ML PO SOLN
30.0000 g | Freq: Every day | ORAL | Status: DC
  Administered 2018-05-04: 30 g via ORAL
  Filled 2018-05-04: qty 45

## 2018-05-04 MED ORDER — PROSIGHT PO TABS
1.0000 | ORAL_TABLET | Freq: Every day | ORAL | Status: DC
  Administered 2018-05-04 – 2018-05-11 (×8): 1 via ORAL
  Filled 2018-05-04 (×8): qty 1

## 2018-05-04 MED ORDER — LACTULOSE 10 GM/15ML PO SOLN
30.0000 g | Freq: Two times a day (BID) | ORAL | Status: DC
  Administered 2018-05-04 – 2018-05-05 (×2): 30 g via ORAL
  Filled 2018-05-04 (×2): qty 45

## 2018-05-04 MED ORDER — METOLAZONE 5 MG PO TABS
2.5000 mg | ORAL_TABLET | Freq: Every day | ORAL | Status: DC
  Administered 2018-05-04 – 2018-05-08 (×5): 2.5 mg via ORAL
  Filled 2018-05-04 (×6): qty 1

## 2018-05-04 MED ORDER — DIPHENHYDRAMINE-ZINC ACETATE 2-0.1 % EX CREA
TOPICAL_CREAM | Freq: Two times a day (BID) | CUTANEOUS | Status: DC | PRN
  Administered 2018-05-05 – 2018-05-06 (×3): via TOPICAL
  Administered 2018-05-06: 1 via TOPICAL
  Administered 2018-05-07: 10:00:00 via TOPICAL
  Filled 2018-05-04: qty 28

## 2018-05-04 NOTE — Consult Note (Signed)
NAME:  Anthony Pritchard., MRN:  [id number], DOB:  10-Feb-1932, LOS: 1 ADMISSION DATE:  05/03/2018, CONSULTATION DATE:  9/26 REFERRING MD:  Dr. Tyrell Antonio, CHIEF COMPLAINT:  SOB   Brief History   84 year old male with cardiac history and EF 25-30% admitted 9/25 with shortness of breath presumably due to CHF exacerbation. He was treated with IV diuresis and did not improve as expected. Dyspnea worsened on 9/26 causing him to require BiPAP. He was transferred to SDU and PCCM was consulted.   Past Medical History  NICM with HFrEF 25-30%, COPD on home O2, OSA on CPAP, CKD III, HTN, and permanent atrial fib.  Significant Hospital Events   9/25 admit 9/26 transfer to SDU on BiPAP for hypercarbia.   Consults: date of consult/date signed off & final recs:  Cardiology  Procedures (surgical and bedside):    Significant Diagnostic Tests:  6/7 Echo LVEF 25-30%, areas of akinesis, LA dilation, RV dilation, RA dilation, mod TR, Pa peak pressure 63.   Micro Data:    Antimicrobials:    Subjective:  Feeling better since BiPAP started.   Objective   Blood pressure 111/69, pulse 70, temperature 97.8 F (36.6 C), temperature source Oral, resp. rate 20, height 5\' 7"  (1.702 m), weight 88.1 kg, SpO2 99 %.        Intake/Output Summary (Last 24 hours) at 05/04/2018 1538 Last data filed at 05/04/2018 1443 Gross per 24 hour  Intake 480 ml  Output 900 ml  Net -420 ml   Filed Weights   05/03/18 0349 05/03/18 1318 05/04/18 0443  Weight: 88.9 kg 87.8 kg 88.1 kg    Examination: General: Elderly male on BiPAP HENT: [place]/AT, PERRL, mild JVP elevation Lungs: Coarse throuhgout Cardiovascular: RRR, no MRG. Paced on monitor Abdomen: Soft, non-tender, non-distended Extremities: No acute deformity or ROM limitation. Trace edema.  Neuro: Spontaneously awake, alert. Seems oriented.  GU: Scrotal edema.   Resolved Hospital Problem list     Assessment & Plan:   Acute on chronic hypoxemic and  hypercarbic respiratory failure: On 2 L [place] at home. Presumably this is due to pulmonary edema in the setting of decompensated CHF. Quit smoking 40-50 years ago.  - SpO2 goal 88-92% - BiPAP mandatory at QHS and PRN for lethargy and hypoxia. - Continue diuresis under the direction of the cardiology consultants.  - ABG if becomes lethargic.   Acute metabolic encephalopathy: improved. Secondary to hypercarbia.  - BiPAP PRN as above.   Acute on chronic HFrEF (LVEF 25-30% with pulmonary hypertension and severe TR) - Telemetry monitoring - Cardiology following  OSA on PAP - BiPAP QHS until acute episode resolves, than can resume home CPAP  CKD III - Per primary   Disposition / Summary of Today's Plan 05/04/18   Monitor in SDU. Looks OK to come off BiPAP for now. Resume if worsens. Needs continued volume removal, hopefully able to achieve this with medication alone.     Diet: Heart Pain/Anxiety/Delirium protocol (if indicated): n/a VAP protocol (if indicated): n/a DVT prophylaxis: enoxaparin  GI prophylaxis: N/a Hyperglycemia protocol: n/a Mobility: up with assist Code Status: Full Family Communication: patient and wife updated at bedside.  Labs   CBC: Recent Labs  Lab 05/03/18 0406 05/04/18 0011  WBC 3.2* 3.4*  NEUTROABS  --  2.3  HGB 8.7* 8.7*  HCT 27.1* 28.4*  MCV 114.3* 116.4*  PLT 118* 113*    Basic Metabolic Panel: Recent Labs  Lab 05/03/18 0406 05/04/18 0011  NA 139  140  K 4.0 4.2  CL 96* 100  CO2 32 31  GLUCOSE 97 109*  BUN 70* 71*  CREATININE 1.94* 1.95*  CALCIUM 9.0 8.8*   GFR: Estimated Creatinine Clearance: 29.9 mL/min (A) (by C-G formula based on SCr of 1.95 mg/dL (H)). Recent Labs  Lab 05/03/18 0406 05/04/18 0011  WBC 3.2* 3.4*    Liver Function Tests: No results for input(s): AST, ALT, ALKPHOS, BILITOT, PROT, ALBUMIN in the last 168 hours. No results for input(s): LIPASE, AMYLASE in the last 168 hours. Recent Labs  Lab 05/04/18 1255    AMMONIA 42*    ABG    Component Value Date/Time   PHART 7.285 (L) 05/04/2018 1340   PCO2ART 74.0 (HH) 05/04/2018 1340   PO2ART 78.0 (L) 05/04/2018 1340   HCO3 34.4 (H) 05/04/2018 1340   TCO2 28 01/12/2018 0935   ACIDBASEDEF 1.0 01/12/2018 0935   O2SAT 94.4 05/04/2018 1340     Coagulation Profile: No results for input(s): INR, PROTIME in the last 168 hours.  Cardiac Enzymes: No results for input(s): CKTOTAL, CKMB, CKMBINDEX, TROPONINI in the last 168 hours.  HbA1C: Hgb A1c MFr Bld  Date/Time Value Ref Range Status  01/12/2018 09:31 AM 5.2 4.8 - 5.6 % Final    Comment:    (NOTE) Pre diabetes:          5.7%-6.4% Diabetes:              >6.4% Glycemic control for   <7.0% adults with diabetes   11/03/2017 04:07 PM 4.5 (L) 4.8 - 5.6 % Final    Comment:    (NOTE) Pre diabetes:          5.7%-6.4% Diabetes:              >6.4% Glycemic control for   <7.0% adults with diabetes     CBG: Recent Labs  Lab 05/04/18 1207  GLUCAP 139*    Admitting History of Present Illness.    84 year old male with PMH as below, which is significant for NICM with HFrEF 25-30%, COPD on home O2, OSA on CPAP, CKD III, HTN, and permanent atrial fib. He was admitted recently in August of this year and had his pacemaker upgraded. Overall he has been admitted 3 times in the past 6 months for reasons related to heart failure  He presented [facility] to [facility] ED on 9/25 with complaints of scrotal edema and shortness of breath. His weight was found to be up 12 pound from his prior discharge 8/22. CXR demonstrated vascular congestion and he was admitted to the to the hospitalists for management. Cardiology was consulted and managed diuresis, which was the bulk of his therapy. Despite a negative fluid balance, his respiratory status worsened on 9/26 requiring him to be placed on BiPAP. He was transferred to SDU and PCCM was asked to evaluate.   Review of Systems:   Bolds are positive  Constitutional:  weight loss, gain, night sweats, Fevers, chills, fatigue .  HEENT: headaches, Sore throat, sneezing, nasal congestion, post nasal drip, Difficulty swallowing, Tooth/dental problems, visual complaints visual changes, ear ache CV:  chest pain, radiates:,Orthopnea, PND, swelling in lower extremities, dizziness, palpitations, syncope.  GI  heartburn, indigestion, abdominal pain, nausea, vomiting, diarrhea, change in bowel habits, loss of appetite, bloody stools.  Resp: cough, productive: , hemoptysis, dyspnea, chest pain, pleuritic.  Skin: rash or itching or icterus GU: dysuria, change in color of urine, urgency or frequency. flank pain, hematuria  MS: joint pain  or swelling. decreased range of motion  Psych: change in mood or affect. depression or anxiety.  Neuro: difficulty with speech, weakness, numbness, ataxia    Past Medical History  He,  has a past medical history of Arthritis, CHF (congestive heart failure) ([place]), Chronic bronchitis ([place]), CKD (chronic kidney disease), stage III ([place]), Coronary artery disease, Diverticulitis, Dyspnea, GERD (gastroesophageal reflux disease), Gout, History of blood transfusion, Hyperlipidemia, Hypertension, MI (myocardial infarction) ([place]) (~ 2000), Nonischemic cardiomyopathy ([place]), OSA on CPAP, Pacemaker single chamber, [place], 2013 (10/13/2011), Permanent atrial fibrillation ([place]), Presence of permanent cardiac pacemaker, Pulmonary hypertension ([place]), and Restless legs.   Surgical History    Past Surgical History:  Procedure Laterality Date  . APPENDECTOMY  12/2000   [name] 12/22/2010  . BIV UPGRADE N/A 02/22/2018   Procedure: BIV UPGRADE;  Surgeon: [name], MD;  Location: [place] CV LAB;  Service: Cardiovascular;  Laterality: N/A;  . BIV UPGRADE N/A 03/29/2018   Procedure: BIV PPM UPGRADE;  Surgeon: [name], MD;  Location: [place] CV LAB;  Service: Cardiovascular;  Laterality: N/A;  . CARDIAC CATHETERIZATION   11/07/2008   nonischemic cardiomyopathy,pulmonary hypertension  . CATARACT EXTRACTION W/ INTRAOCULAR LENS  IMPLANT, BILATERAL Bilateral   . CIRCUMCISION  12/2005   [name] 12/22/2010  . COLOSTOMY  12/2000   [name] 12/22/2010  . COLOSTOMY REVERSAL  07/2001   [name] 12/22/2010  . CORONARY ANGIOPLASTY  06/01/1999   successful to ostium of the first diagonal  . ESOPHAGOGASTRODUODENOSCOPY N/A 08/22/2013   Procedure: ESOPHAGOGASTRODUODENOSCOPY (EGD);  Surgeon: [name], MD;  Location[facility] ENDOSCOPY;  Service: Endoscopy;  Laterality: N/A;  h/p in file cabinet, jackie  . ESOPHAGOGASTRODUODENOSCOPY N/A 11/05/2014   Procedure: ESOPHAGOGASTRODUODENOSCOPY (EGD);  Surgeon: Clarene Essex, MD;  Location: [facility] ENDOSCOPY;  Service: Endoscopy;  Laterality: N/A;  . ESOPHAGOGASTRODUODENOSCOPY N/A 11/08/2016   Procedure: ESOPHAGOGASTRODUODENOSCOPY (EGD);  Surgeon: [name], MD;  Location: [facility] ENDOSCOPY;  Service: Endoscopy;  Laterality: N/A;  . ESOPHAGOGASTRODUODENOSCOPY (EGD) WITH PROPOFOL Left 11/05/2017   Procedure: ESOPHAGOGASTRODUODENOSCOPY (EGD) WITH PROPOFOL;  Surgeon: [name], MD;  Location: [place];  Service: Endoscopy;  Laterality: Left;  . HOT HEMOSTASIS N/A 11/05/2014   Procedure: HOT HEMOSTASIS (ARGON PLASMA COAGULATION/BICAP);  Surgeon: Clarene Essex, MD;  Location: [facility] ENDOSCOPY;  Service: Endoscopy;  Laterality: N/A;  . INSERT / REPLACE / REMOVE PACEMAKER    . JOINT REPLACEMENT    . MASS EXCISION Left    hand w/ulnar artery reconstruction/notes 12/22/2010  . NM MYOCAR PERF WALL MOTION  11/24/2007   normal  . PERMANENT PACEMAKER INSERTION  10/04/2012   [employer]  . PERMANENT PACEMAKER INSERTION N/A 10/12/2011   Procedure: PERMANENT PACEMAKER INSERTION;  Surgeon: [name], MD;  Location: [place] CATH LAB;  Service: Cardiovascular;  Laterality: N/A;  . REPLACEMENT TOTAL KNEE Bilateral 11/2006   right-left/notes 12/22/2010  . ROTATOR CUFF REPAIR Right 09/2004   [name] 12/22/2010  .  SAVORY DILATION N/A 08/22/2013   Procedure: SAVORY DILATION;  Surgeon: [name], MD;  [facility] END[facility];  Service: Endoscopy;  Laterality: N/A;  . SHOULDER SURGERY Right    "fell off house; messed up 3 things in my arm"  . TONSILLECTOMY    . US ECHOCARDIOGRAPHY  02/01/2011   LA is mod-severely dilated,AOV & root sclerotic,ca+ AOV leaflets     Social History   Social History   Socioeconomic History  . Marital status: Married    Spouse name: Not on file  . Number of children:  Not on file  . Years of education: Not on file  . Highest education level: Not on file  Occupational History  . Occupation: retired  Scientific laboratory technician  . Financial resource strain: Not on file  . Food insecurity:    Worry: Not on file    Inability: Not on file  . Transportation needs:    Medical: Not on file    Non-medical: Not on file  Tobacco Use  . Smoking status: Former Research scientist (life sciences)  . Smokeless tobacco: Never Used  . Tobacco comment: 09/30/2017 "it's been over 30yr since I smoked anything"  Substance and Sexual Activity  . Alcohol use: No  . Drug use: No  . Sexual activity: Not Currently  Lifestyle  . Physical activity:    Days per week: Not on file    Minutes per session: Not on file  . Stress: Not on file  Relationships  . Social connections:    Talks on phone: Not on file    Gets together: Not on file    Attends religious service: Not on file    Active member of club or organization: Not on file    Attends meetings of clubs or organizations: Not on file    Relationship status: Not on file  . Intimate partner violence:    Fear of current or ex partner: Not on file    Emotionally abused: Not on file    Physically abused: Not on file    Forced sexual activity: Not on file  Other Topics Concern  . Not on file  Social History Narrative  . Not on file  ,  reports that he has quit smoking. He has never used smokeless tobacco. He reports that he does not drink alcohol or use drugs.   Family  History   His family history includes Cancer in his mother; Diabetes in his brother; Heart attack in his father.   Allergies Allergies  Allergen Reactions  . Vicodin [Hydrocodone-Acetaminophen] Itching and Nausea Only     Home Medications  Prior to Admission medications   Medication Sig Start Date End Date Taking? Authorizing Provider  allopurinol (ZYLOPRIM) 100 MG tablet Take 2 tablets (200 mg total) by mouth daily. 11/09/16  Yes Theodis Blaze, MD  dutasteride (AVODART) 0.5 MG capsule Take 0.5 mg by mouth daily.   Yes [provider]  escitalopram (LEXAPRO) 5 MG tablet Take 1 tablet (5 mg total) by mouth at bedtime. 11/09/16  Yes Theodis Blaze, MD  furosemide (LASIX) 80 MG tablet Take 1 tablet (80 mg total) by mouth 2 (two) times daily. 01/25/18  Yes Lendon Colonel, NP  hydrOXYzine (ATARAX/VISTARIL) 25 MG tablet TAKE 1 TABLET BY MOUTH EVERY 6 HOURS AS NEEDED FOR  ITCHING Patient taking differently: Take 25 mg by mouth every 6 (six) hours as needed for itching.  01/11/18  Yes Croitoru, Mihai, MD  mirtazapine (REMERON) 15 MG tablet Take 15 mg by mouth at bedtime.   Yes [provider]  Multiple Vitamin (MULTIVITAMIN WITH MINERALS) TABS tablet Take 1 tablet by mouth daily.   Yes [provider]  Multiple Vitamins-Minerals (PRESERVISION AREDS 2) CAPS Take 1 capsule by mouth daily.   Yes [provider]  mupirocin ointment (BACTROBAN) 2 % Apply 1 application topically 2 (two) times daily. To toe 05/01/18  Yes [provider]  pantoprazole (PROTONIX) 40 MG tablet Take 1 tablet (40 mg total) by mouth daily. 11/08/17  Yes Nita Sells, MD  potassium chloride (K-DUR) 10 MEQ  tablet Take 10 mEq by mouth 2 (two) times daily.   Yes [provider]  rOPINIRole (REQUIP) 4 MG tablet Take 4 mg by mouth at bedtime.   Yes [provider]  Tamsulosin HCl (FLOMAX) 0.4 MG CAPS Take 0.4 mg by mouth daily after breakfast.   Yes [provider]  traMADol (ULTRAM) 50 MG tablet Take 100 mg by mouth 2 (two) times daily.    Yes [provider]  triamcinolone cream (KENALOG) 0.1 % Apply 1 application topically 2 (two) times daily.  05/01/18  Yes [provider]  OXYGEN Inhale 2 L into the lungs daily.    [provider]      Georgann Housekeeper, AGACNP-BC [place] Pager [phone] or [phone]  05/04/2018 3:38 PM

## 2018-05-04 NOTE — Progress Notes (Signed)
Pt. returned to bed, placed on bipap per MD by respiratory therapist. Wife at the bedside, MD at bedside explained to pt and wife the plan of care at this point. Awaiting stepdown bed.

## 2018-05-04 NOTE — Progress Notes (Signed)
Pt. Resting comfortably bipap in place family at the bedside attempted to call report.

## 2018-05-04 NOTE — Progress Notes (Signed)
Transported patient on BIPAP to 4H53 without complications.

## 2018-05-04 NOTE — Progress Notes (Signed)
Progress Note  Patient Name: Anthony BUN Sr. Date of Encounter: 05/04/2018  Primary Cardiologist: [name], MD   Subjective   Sitting up in his chair, eating lunch.  Overall mildly reduced shortness of breath but still not there.  His wife came in in her motorized scooter and introduced herself.  Inpatient Medications    Scheduled Meds: . allopurinol  200 mg Oral Daily  . aspirin EC  81 mg Oral Daily  . dutasteride  0.5 mg Oral Daily  . enoxaparin (LOVENOX) injection  30 mg Subcutaneous Q24H  . escitalopram  5 mg Oral QHS  . furosemide  80 mg Intravenous Q12H  . Influenza vac split quadrivalent PF  0.5 mL Intramuscular Tomorrow-1000  . lactulose  30 g Oral Daily  . mirtazapine  15 mg Oral QHS  . multivitamin  1 tablet Oral Daily  . pantoprazole  40 mg Oral Daily  . rOPINIRole  4 mg Oral QHS  . sodium chloride flush  3 mL Intravenous Q12H  . tamsulosin  0.4 mg Oral QPC breakfast   Continuous Infusions: . sodium chloride     PRN Meds: sodium chloride, acetaminophen, ondansetron (ZOFRAN) IV, sodium chloride flush   Vital Signs    Vitals:   05/04/18 0443 05/04/18 0837 05/04/18 0855 05/04/18 1342  BP: 113/66   103/62  Pulse: 73 74  71  Resp: 18   16  Temp: 97.6 F (36.4 C)   97.8 F (36.6 C)  TempSrc: Oral   Oral  SpO2: 92% (!) 86% 94% 93%  Weight: 88.1 kg     Height:        Intake/Output Summary (Last 24 hours) at 05/04/2018 1402 Last data filed at 05/04/2018 1025 Gross per 24 hour  Intake 240 ml  Output 900 ml  Net -660 ml   Filed Weights   05/03/18 0349 05/03/18 1318 05/04/18 0443  Weight: 88.9 kg 87.8 kg 88.1 kg    Telemetry    No adverse arrhythmias- Personally Reviewed  ECG    No new- Personally Reviewed  Physical Exam   GEN: No acute distress.  Elderly Neck: No JVD Cardiac: RRR, no murmurs, rubs, or gallops.  Respiratory:  Fine crackles at bases bilaterally GI: Soft, nontender, non-distended  MS: No edema; No deformity. Neuro:   Nonfocal  Psych: Normal affect   Labs    Chemistry Recent Labs  Lab 05/03/18 0406 05/04/18 0011  NA 139 140  K 4.0 4.2  CL 96* 100  CO2 32 31  GLUCOSE 97 109*  BUN 70* 71*  CREATININE 1.94* 1.95*  CALCIUM 9.0 8.8*  GFRNONAA 30* 30*  GFRAA 35* 35*  ANIONGAP 11 9     Hematology Recent Labs  Lab 05/03/18 0406 05/04/18 0011  WBC 3.2* 3.4*  RBC 2.37* 2.44*  HGB 8.7* 8.7*  HCT 27.1* 28.4*  MCV 114.3* 116.4*  MCH 36.7* 35.7*  MCHC 32.1 30.6  RDW 17.4* 17.5*  PLT 118* 113*    Cardiac EnzymesNo results for input(s): TROPONINI in the last 168 hours.  Recent Labs  Lab 05/03/18 0412  TROPIPOC 0.03     BNP Recent Labs  Lab 05/03/18 0406  BNP 1,865.0*     DDimer No results for input(s): DDIMER in the last 168 hours.   Radiology    Dg Chest 2 View  Result Date: 05/03/2018 CLINICAL DATA:  84 year old male with CHF. EXAM: CHEST - 2 VIEW COMPARISON:  Chest radiograph dated 03/30/2018 FINDINGS: There is cardiomegaly with mild  vascular congestion and probable mild edema. No focal consolidation, pleural effusion, or pneumothorax. There is atherosclerotic calcification of the aorta. The aorta is tortuous. Left pectoral pacemaker device. Osteopenia with degenerative changes of the spine. No acute osseous pathology. IMPRESSION: Cardiomegaly with mild vascular congestion and probable mild interstitial edema. Electronically Signed   By: Anner Crete M.D.   On: 05/03/2018 04:53    Cardiac Studies   EF 25 to 30%  Patient Profile     84 y.o. male   Assessment & Plan    Acute on chronic systolic heart failure, respiratory failure  - I will increase Lasix to 80 mg 3 times a day and add metolazone 2.5 mg.   He is not currently on beta-blocker because of prior hypotension.  He is not on ACE inhibitor because of chronic kidney disease. -Nonischemic cardiomyopathy  Chronic kidney disease stage III -Creatinine 1.95.  Baseline in the 1.4 range.  Will be very careful  with metolazone.  Permanent atrial fibrillation - Pacing.  Doing well.  No anticoagulation because of prior bleeding issues.  No changes made.  CRT-P upgrade recently.  COPD - Supplemental oxygen, no beta-blocker, no changes       For questions or updates, please contact [place] Please consult [email or web address] for contact info under        Signed, Candee Furbish, MD  05/04/2018, 2:02 PM

## 2018-05-04 NOTE — Progress Notes (Signed)
CRITICAL VALUE ALERT  Critical Value:  PH 7.28 CO2 74.0 PO2 BICARB 34.4   Date & Time Notied:  05/04/18 1445  Provider Notified: Dr. Frederic Jericho  Orders Received/Actions taken: bipap

## 2018-05-04 NOTE — Progress Notes (Signed)
Bladder scan complete <35 .

## 2018-05-04 NOTE — Progress Notes (Signed)
Placed patient on Bipap for the night.  

## 2018-05-04 NOTE — Progress Notes (Signed)
Came to room to draw ABG, patient in bathroom and RN stated patient might be a while to come back for ABG.

## 2018-05-04 NOTE — Consult Note (Signed)
Reason for Consult: AKI Referring Physician:  Tyrell Antonio, MD  Anthony Agar Sr. is an 84 y.o. male.  HPI: [name] is an 84 yo WM with a PMH significant for HTN, CAD, chronic A fib (off of eliquis due to AVM's), OSA on CPAP, pulmonary HTN, nonischemic CMP (EF 25-30%) s/p pacemaker, obesity, gout, and BPH who presented to [facility] ED on 05/03/18 with worsening SOB and lower extremity edema.  His wife reports that they were on an 8 day cruise and over that time he gained 20lbs due to dietary discretion.   He was admitted for acute on chronic CHF with hypoxia and hypercapnic respiratory failure.  He was started on IV lasix and we were consulted due to the development of AKI/CKD stage 3.  He had a similar presentation last year when his Cr peaked at 4 (he was also taking an ARB at that time but has been off of them since).  The trend in Scr is seen below.    Despite diuresis, his SOB persisted and he is currently on BiPap due to hypercarbia and is somewhat somnolent but arousable.  He was transferred to SDU for further monitoring.  He denies any N/V/D, no chest pain, SOB, dysuria, pyuria, hematuria, urgency, frequency, or retention (although he did require an I&O foley yesterday for retention).  He also denies any NSAID or COX-II Inhibitor use.  Trend in Creatinine: Creatinine, Ser  Date/Time Value Ref Range Status  05/04/2018 12:11 AM 1.95 (H) 0.61 - 1.24 mg/dL Final  05/03/2018 04:06 AM 1.94 (H) 0.61 - 1.24 mg/dL Final  03/22/2018 12:57 PM 1.18 0.76 - 1.27 mg/dL Final  02/21/2018 09:28 AM 1.56 (H) 0.76 - 1.27 mg/dL Final  01/17/2018 04:54 AM 1.03 0.61 - 1.24 mg/dL Final  01/14/2018 07:56 AM 1.10 0.61 - 1.24 mg/dL Final  01/13/2018 05:23 AM 1.13 0.61 - 1.24 mg/dL Final  01/12/2018 03:06 AM 1.54 (H) 0.61 - 1.24 mg/dL Final  11/08/2017 05:00 AM 1.11 0.61 - 1.24 mg/dL Final  11/07/2017 05:07 AM 1.19 0.61 - 1.24 mg/dL Final  11/06/2017 04:16 AM 1.38 (H) 0.61 - 1.24 mg/dL Final  11/05/2017 03:54 AM 2.03 (H)  0.61 - 1.24 mg/dL Final  11/04/2017 11:39 AM 2.18 (H) 0.61 - 1.24 mg/dL Final  11/03/2017 11:53 AM 3.68 (H) 0.61 - 1.24 mg/dL Final  10/02/2017 04:15 AM 1.47 (H) 0.61 - 1.24 mg/dL Final  10/01/2017 04:21 AM 1.33 (H) 0.61 - 1.24 mg/dL Final  09/30/2017 03:42 PM 1.37 (H) 0.61 - 1.24 mg/dL Final  09/27/2017 02:10 AM 1.58 (H) 0.61 - 1.24 mg/dL Final  09/26/2017 01:19 AM 1.79 (H) 0.61 - 1.24 mg/dL Final  03/09/2017 10:19 AM 1.44 (H) 0.76 - 1.27 mg/dL Final  11/10/2016 05:47 AM 1.84 (H) 0.61 - 1.24 mg/dL Final  11/09/2016 03:38 AM 1.64 (H) 0.61 - 1.24 mg/dL Final  11/08/2016 06:09 AM 1.94 (H) 0.61 - 1.24 mg/dL Final  11/07/2016 02:09 PM 2.55 (H) 0.61 - 1.24 mg/dL Final  11/07/2016 06:08 AM 2.83 (H) 0.61 - 1.24 mg/dL Final  11/06/2016 12:46 PM 3.50 (H) 0.61 - 1.24 mg/dL Final  11/06/2016 02:09 AM 4.01 (H) 0.61 - 1.24 mg/dL Final  11/05/2016 12:06 PM 3.80 (H) 0.61 - 1.24 mg/dL Final  11/05/2016 02:36 AM 3.53 (H) 0.61 - 1.24 mg/dL Final  11/04/2016 12:33 PM 3.39 (H) 0.61 - 1.24 mg/dL Final  11/04/2016 09:53 AM 3.28 (H) 0.61 - 1.24 mg/dL Final  11/04/2016 03:13 AM 3.35 (H) 0.61 - 1.24 mg/dL Final  11/03/2016 10:39 PM  3.22 (H) 0.61 - 1.24 mg/dL Final  11/03/2016 05:27 PM 3.30 (H) 0.61 - 1.24 mg/dL Final  11/03/2016 03:20 PM 3.36 (H) 0.61 - 1.24 mg/dL Final  11/03/2016 01:27 PM 3.35 (H) 0.61 - 1.24 mg/dL Final  08/19/2014 11:40 PM 1.30 0.50 - 1.35 mg/dL Final  11/08/2008 04:13 AM 1.04 0.4 - 1.5 mg/dL Final  11/07/2008 02:20 AM 1.22 0.4 - 1.5 mg/dL Final  11/06/2008 05:22 AM 1.00 0.4 - 1.5 mg/dL Final  11/05/2008 02:25 AM 0.87 0.4 - 1.5 mg/dL Final  11/04/2008 06:57 PM 0.94 0.4 - 1.5 mg/dL Final  01/23/2007 03:07 PM 0.63  Final    PMH:   Past Medical History:  Diagnosis Date  . Arthritis    "feet" (09/30/2017)  . CHF (congestive heart failure) ([place])   . Chronic bronchitis ([place])   . CKD (chronic kidney disease), stage III ([place])    [name] 09/30/2017  . Coronary artery disease   .  Diverticulitis   . Dyspnea   . GERD (gastroesophageal reflux disease)   . Gout   . History of blood transfusion    "blood loss" (09/30/2017)  . Hyperlipidemia   . Hypertension   . MI (myocardial infarction) ([place]) ~ 2000   "light one"  . Nonischemic cardiomyopathy (HCC)    mild  . OSA on CPAP   . Pacemaker single chamber, [place], 2013 10/13/2011  . Permanent atrial fibrillation (HCC)    on Eliquis  . Presence of permanent cardiac pacemaker   . Pulmonary hypertension ([place])   . Restless legs     PSH:   Past Surgical History:  Procedure Laterality Date  . APPENDECTOMY  12/2000   [name] 12/22/2010  . BIV UPGRADE N/A 02/22/2018   Procedure: BIV UPGRADE;  Surgeon: [name], MD;  Location: [place] CV LAB;  Service: Cardiovascular;  Laterality: N/A;  . BIV UPGRADE N/A 03/29/2018   Procedure: BIV PPM UPGRADE;  Surgeon: [name], MD;  Location: [place] CV LAB;  Service: Cardiovascular;  Laterality: N/A;  . CARDIAC CATHETERIZATION  11/07/2008   nonischemic cardiomyopathy,pulmonary hypertension  . CATARACT EXTRACTION W/ INTRAOCULAR LENS  IMPLANT, BILATERAL Bilateral   . CIRCUMCISION  12/2005   [name] 12/22/2010  . COLOSTOMY  12/2000   [name] 12/22/2010  . COLOSTOMY REVERSAL  07/2001   [name] 12/22/2010  . CORONARY ANGIOPLASTY  06/01/1999   successful to ostium of the first diagonal  . ESOPHAGOGASTRODUODENOSCOPY N/A 08/22/2013   Procedure: ESOPHAGOGASTRODUODENOSCOPY (EGD);  Surgeon: [name], MD;  Location: [facility] ENDOSCOPY;  Service: Endoscopy;  Laterality: N/A;  h/p in file cabinet, jackie  . ESOPHAGOGASTRODUODENOSCOPY N/A 11/05/2014   Procedure: ESOPHAGOGASTRODUODENOSCOPY (EGD);  Surgeon: Clarene Essex, MD;  Location[facility] ENDOSCOPY;  Service: Endoscopy;  Laterality: N/A;  . ESOPHAGOGASTRODUODENOSCOPY N/A 11/08/2016   Procedure: ESOPHAGOGASTRODUODENOSCOPY (EGD);  Surgeon: [name], MD;  Location: [facility] ENDOSCOPY;  Service: Endoscopy;  Laterality: N/A;  .  ESOPHAGOGASTRODUODENOSCOPY (EGD) WITH PROPOFOL Left 11/05/2017   Procedure: ESOPHAGOGASTRODUODENOSCOPY (EGD) WITH PROPOFOL;  Surgeon: [name], MD;  Location: [place];  Service: Endoscopy;  Laterality: Left;  . HOT HEMOSTASIS N/A 11/05/2014   Procedure: HOT HEMOSTASIS (ARGON PLASMA COAGULATION/BICAP);  Surgeon: Clarene Essex, MD;  Location: [facility] ENDOSCOPY;  Service: Endoscopy;  Laterality: N/A;  . INSERT / REPLACE / REMOVE PACEMAKER    . JOINT REPLACEMENT    . MASS EXCISION Left    hand w/ulnar artery reconstruction/notes 12/22/2010  . NM MYOCAR PERF WALL MOTION  11/24/2007   normal  .  PERMANENT PACEMAKER INSERTION  10/04/2012   [employer]  . PERMANENT PACEMAKER INSERTION N/A 10/12/2011   Procedure: PERMANENT PACEMAKER INSERTION;  Surgeon: [name], MD;  Location: [place] CATH LAB;  Service: Cardiovascular;  Laterality: N/A;  . REPLACEMENT TOTAL KNEE Bilateral 11/2006   right-left/notes 12/22/2010  . ROTATOR CUFF REPAIR Right 09/2004   [name] 12/22/2010  . SAVORY DILATION N/A 08/22/2013   Procedure: SAVORY DILATION;  Surgeon: [name], MD;  Location: [facility] ENDOSCOPY;  Service: Endoscopy;  Laterality: N/A;  . SHOULDER SURGERY Right    "fell off house; messed up 3 things in my arm"  . TONSILLECTOMY    . US ECHOCARDIOGRAPHY  02/01/2011   LA is mod-severely dilated,AOV & root sclerotic,ca+ AOV leaflets    Allergies:  Allergies  Allergen Reactions  . Vicodin [Hydrocodone-Acetaminophen] Itching and Nausea Only    Medications:   Prior to Admission medications   Medication Sig Start Date End Date Taking? Authorizing Provider  allopurinol (ZYLOPRIM) 100 MG tablet Take 2 tablets (200 mg total) by mouth daily. 11/09/16  Yes Theodis Blaze, MD  dutasteride (AVODART) 0.5 MG capsule Take 0.5 mg by mouth daily.   Yes [provider]  escitalopram (LEXAPRO) 5 MG tablet Take 1 tablet (5 mg total) by mouth at bedtime. 11/09/16  Yes Theodis Blaze, MD  furosemide (LASIX) 80 MG tablet  Take 1 tablet (80 mg total) by mouth 2 (two) times daily. 01/25/18  Yes Lendon Colonel, NP  hydrOXYzine (ATARAX/VISTARIL) 25 MG tablet TAKE 1 TABLET BY MOUTH EVERY 6 HOURS AS NEEDED FOR  ITCHING Patient taking differently: Take 25 mg by mouth every 6 (six) hours as needed for itching.  01/11/18  Yes Croitoru, Mihai, MD  mirtazapine (REMERON) 15 MG tablet Take 15 mg by mouth at bedtime.   Yes [provider]  Multiple Vitamin (MULTIVITAMIN WITH MINERALS) TABS tablet Take 1 tablet by mouth daily.   Yes [provider]  Multiple Vitamins-Minerals (PRESERVISION AREDS 2) CAPS Take 1 capsule by mouth daily.   Yes [provider]  mupirocin ointment (BACTROBAN) 2 % Apply 1 application topically 2 (two) times daily. To toe 05/01/18  Yes [provider]  pantoprazole (PROTONIX) 40 MG tablet Take 1 tablet (40 mg total) by mouth daily. 11/08/17  Yes Nita Sells, MD  potassium chloride (K-DUR) 10 MEQ tablet Take 10 mEq by mouth 2 (two) times daily.   Yes [provider]  rOPINIRole (REQUIP) 4 MG tablet Take 4 mg by mouth at bedtime.   Yes [provider]  Tamsulosin HCl (FLOMAX) 0.4 MG CAPS Take 0.4 mg by mouth daily after breakfast.   Yes [provider]  traMADol (ULTRAM) 50 MG tablet Take 100 mg by mouth 2 (two) times daily.    Yes [provider]  triamcinolone cream (KENALOG) 0.1 % Apply 1 application topically 2 (two) times daily.  05/01/18  Yes [provider]  OXYGEN Inhale 2 L into the lungs daily.    [provider]    Inpatient medications: . allopurinol  200 mg Oral Daily  . aspirin EC  81 mg Oral Daily  . dutasteride  0.5 mg Oral Daily  . enoxaparin (LOVENOX) injection  30 mg Subcutaneous Q24H  . escitalopram  5 mg Oral QHS  . furosemide  80 mg Intravenous Q8H  . Influenza vac split quadrivalent PF  0.5 mL Intramuscular Tomorrow-1000  . lactulose  30 g Oral BID  . metolazone  2.5 mg Oral Daily   .  multivitamin  1 tablet Oral Daily  . pantoprazole  40 mg Oral Daily  . rOPINIRole  4 mg Oral QHS  . sodium chloride flush  3 mL Intravenous Q12H  . tamsulosin  0.4 mg Oral QPC breakfast    Discontinued Meds:   Medications Discontinued During This Encounter  Medication Reason  . rOPINIRole (REQUIP) 3 MG tablet Change in therapy  . potassium chloride SA (KLOR-CON M20) 20 MEQ tablet Change in therapy  . loratadine (CLARITIN) 10 MG tablet Discontinued by provider  . Ferrous Gluconate (IRON 27 PO) Discontinued by provider  . colchicine 0.6 MG tablet No longer needed (for PRN medications)  . Bisacodyl (LAXATIVE PO) No longer needed (for PRN medications)  . hydrOXYzine (ATARAX/VISTARIL) tablet 10 mg   . guaiFENesin (MUCINEX) 600 MG 12 hr tablet   . Magnesium 200 MG TABS   . doxycycline (VIBRA-TABS) 100 MG tablet   . multivitamin-lutein (OCUVITE-LUTEIN) capsule 1 capsule P&T Policy: Therapeutic Substitute  . traMADol (ULTRAM) tablet 100 mg   . hydrOXYzine (ATARAX/VISTARIL) tablet 25 mg   . furosemide (LASIX) injection 80 mg   . mirtazapine (REMERON) tablet 15 mg   . lactulose (CHRONULAC) 10 GM/15ML solution 30 g     Social History:  reports that he has quit smoking. He has never used smokeless tobacco. He reports that he does not drink alcohol or use drugs.  Family History:   Family History  Problem Relation Age of Onset  . Cancer Mother   . Heart attack Father   . Diabetes Brother     Pertinent items are noted in HPI. Weight change: -1.134 kg  Intake/Output Summary (Last 24 hours) at 05/04/2018 1556 Last data filed at 05/04/2018 1443 Gross per 24 hour  Intake 480 ml  Output 900 ml  Net -420 ml   BP 111/69   Pulse 70   Temp 97.8 F (36.6 C) (Oral)   Resp 20   Ht 5\' 7"  (1.702 m)   Wt 88.1 kg Comment: scale c  SpO2 99%   BMI 30.42 kg/m  Vitals:   05/04/18 0855 05/04/18 1342 05/04/18 1505 05/04/18 1513  BP:  103/62  111/69  Pulse:  71 70 70  Resp:  16 (!) 28 20   Temp:  97.8 F (36.6 C)    TempSrc:  Oral    SpO2: 94% 93% 100% 99%  Weight:      Height:         General appearance: fatigued, slowed mentation and wearing BiPap Head: Normocephalic, without obvious abnormality, atraumatic Eyes: negative findings: lids and lashes normal, conjunctivae and sclerae normal and corneas clear Resp: clear to auscultation bilaterally Cardio: regular rate and rhythm, S1, S2 normal, no murmur, click, rub or gallop GI: soft, non-tender; bowel sounds normal; no masses,  no organomegaly Extremities: extremities normal, atraumatic, no cyanosis or edema  Labs: Basic Metabolic Panel: Recent Labs  Lab 05/03/18 0406 05/04/18 0011  NA 139 140  K 4.0 4.2  CL 96* 100  CO2 32 31  GLUCOSE 97 109*  BUN 70* 71*  CREATININE 1.94* 1.95*  CALCIUM 9.0 8.8*   Liver Function Tests: No results for input(s): AST, ALT, ALKPHOS, BILITOT, PROT, ALBUMIN in the last 168 hours. No results for input(s): LIPASE, AMYLASE in the last 168 hours. Recent Labs  Lab 05/04/18 1255  AMMONIA 42*   CBC: Recent Labs  Lab 05/03/18 0406 05/04/18 0011  WBC 3.2* 3.4*  NEUTROABS  --  2.3  HGB 8.7* 8.7*  HCT  27.1* 28.4*  MCV 114.3* 116.4*  PLT 118* 113*   PT/INR: @LABRCNTIP (inr:5) Cardiac Enzymes: )No results for input(s): CKTOTAL, CKMB, CKMBINDEX, TROPONINI in the last 168 hours. CBG: Recent Labs  Lab 05/04/18 1207  GLUCAP 139*    Iron Studies: No results for input(s): IRON, TIBC, TRANSFERRIN, FERRITIN in the last 168 hours.  Xrays/Other Studies: Dg Chest 2 View  Result Date: 05/03/2018 CLINICAL DATA:  84 year old male with CHF. EXAM: CHEST - 2 VIEW COMPARISON:  Chest radiograph dated 03/30/2018 FINDINGS: There is cardiomegaly with mild vascular congestion and probable mild edema. No focal consolidation, pleural effusion, or pneumothorax. There is atherosclerotic calcification of the aorta. The aorta is tortuous. Left pectoral pacemaker device. Osteopenia with  degenerative changes of the spine. No acute osseous pathology. IMPRESSION: Cardiomegaly with mild vascular congestion and probable mild interstitial edema. Electronically Signed   By: Anner Crete M.D.   On: 05/03/2018 04:53     Assessment/Plan: 1.  AKI/CKD stage 3 in setting of decompensated CHF with hypoxemia and hypercarbic respiratory failure.  He diuresed 1.5 liters overnight and Cr has remained stable at 1.96.  No indication for dialysis at this time and will continue to follow along now that metolazone has been added.  I discussed the pathophysiology of cardiorenal syndrome with he and his wife who understand and are familiar with this due to a similar episode in March 2018.   2. Acute on chronic CHF- diuresing but still with hypoxia and hypercarbia.  Agree with BiPap and transfer to SDU 3. Acute on chronic hypoxic and hypercapnic respiratory failure- currently on BiPap and PCCM following. 4. Anemia with increased MCV- will check B12 and folate levels as well as iron.  Appears to be chronic.  He did have some GI bleeding from angiodysplastic lesions in his stomach and duodenum seen on EGD in March 2019.  Off of blood thinners per his wife. 5. Rash- on legs, started doxycycline 3 days pta. 6. Thrombocytopenia  7. Permanent A fib- s/p bi-v pacemaker 8. COPD- currently on BiPap 9. Pancytopenia- will check SPEP/UPEP 10. BPH on avodart and flomax 11.    Governor Rooks Cora Stetson 05/04/2018, 3:56 PM

## 2018-05-04 NOTE — Evaluation (Signed)
Physical Therapy Evaluation Patient Details Name: Anthony WEIDNER Sr. MRN: [id number] DOB: 1931/11/01 Today's Date: 05/04/2018   History of Present Illness  84 y.o. male admitted with CHF exacerbation. PMHx: CHF with EF of 25%, NICM, pulmonary HTN, RLS, OSA on CPAP, severe tricuspid regurgitation, permanent Afib with slow ventricular response, pacemaker  Clinical Impression  Pt aroused by RN on arrival but required at least 3 attempts to fully arouse and maintain attention to task. Once eOB pt slightly irratic with movement varied between anterior and posterior lean and reaching for items. PT states he doesn't really use AD at home but definitely benefits from RW this session. Pt with need for several standing rests with gait and demonstrates decreased awareness of safety and deficits. Pt with decreased cognition, balance, strength, safety and function who will benefit from acute therapy to maximize mobility, safety and function. 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 Pt may require SNF if cognition, safety and balance do not improve prior to D/C.   86% on 2L, 94% on 4L    Follow Up Recommendations SNF;Supervision/Assistance - 24 hour;Home health PT(pending activity progression and cognition)    Equipment Recommendations  None recommended by PT    Recommendations for Other Services OT consult     Precautions / Restrictions Precautions Precautions: Fall      Mobility  Bed Mobility Overal bed mobility: Modified Independent             General bed mobility comments: HOB 45 degrees, increased time, cues to initiate  Transfers Overall transfer level: Needs assistance   Transfers: Sit to/from Stand Sit to Stand: Min guard         General transfer comment: cues for safety and RW use  Ambulation/Gait Ambulation/Gait assistance: Min guard Gait Distance (Feet): 300 Feet Assistive device: Rolling walker (2 wheeled) Gait Pattern/deviations:  Step-through pattern;Decreased stride length;Trunk flexed   Gait velocity interpretation: 1.31 - 2.62 ft/sec, indicative of limited community ambulator General Gait Details: flexed trunk, 3 standing rests with assist for balance and safety  Stairs            Wheelchair Mobility    Modified Rankin (Stroke Patients Only)       Balance Overall balance assessment: Needs assistance   Sitting balance-Leahy Scale: Fair       Standing balance-Leahy Scale: Poor                               Pertinent Vitals/Pain Pain Assessment: No/denies pain    Home Living Family/patient expects to be discharged to:: Private residence Living Arrangements: Spouse/significant other Available Help at Discharge: Family;Available PRN/intermittently Type of Home: House Home Access: Ramped entrance     Home Layout: One level Home Equipment: Walker - 2 wheels;Cane - single point;Electric scooter;Crutches;Tub bench;Bedside commode Additional Comments: 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 Children/grandchildren live nearby and assist PRN; children help care for pt's wife as well    Prior Function Level of Independence: Independent with assistive device(s)         Comments: Intermittent use of SPC or RW. Pt still drives. Assists wife with ADLs. Pt endorses multiple falls at home     Hand Dominance   Dominant Hand: Right    Extremity/Trunk Assessment   Upper Extremity Assessment Upper Extremity Assessment: Generalized weakness    Lower Extremity Assessment Lower Extremity Assessment: Generalized weakness    Cervical / Trunk Assessment Cervical / Trunk  Assessment: Kyphotic  Communication   Communication: No difficulties  Cognition Arousal/Alertness: Lethargic Behavior During Therapy: Flat affect;Impulsive Overall Cognitive Status: Impaired/Different from baseline                                 General Comments: slow processing, initially drifting to sleep  frequently with cues to maintain arousal, once EOB pt impulsive      General Comments      Exercises     Assessment/Plan    PT Assessment Patient needs continued PT services  PT Problem List Decreased strength;Decreased mobility;Decreased safety awareness;Decreased activity tolerance;Decreased balance;Decreased knowledge of use of DME;Decreased cognition;Cardiopulmonary status limiting activity;Decreased knowledge of precautions       PT Treatment Interventions Gait training;Therapeutic activities;Neuromuscular re-education;Stair training;Therapeutic exercise;Cognitive remediation;DME instruction;Functional mobility training;Balance training    PT Goals (Current goals can be found in the Care Plan section)  Acute Rehab PT Goals Patient Stated Goal: return home PT Goal Formulation: With patient Time For Goal Achievement: 05/18/18 Potential to Achieve Goals: Fair    Frequency Min 3X/week   Barriers to discharge Decreased caregiver support      Co-evaluation               AM-PAC PT "6 Clicks" Daily Activity  Outcome Measure Difficulty turning over in bed (including adjusting bedclothes, sheets and blankets)?: A Lot Difficulty moving from lying on back to sitting on the side of the bed? : A Lot Difficulty sitting down on and standing up from a chair with arms (e.g., wheelchair, bedside commode, etc,.)?: A Little Help needed moving to and from a bed to chair (including a wheelchair)?: A Little Help needed walking in hospital room?: A Little Help needed climbing 3-5 steps with a railing? : A Lot 6 Click Score: 15    End of Session Equipment Utilized During Treatment: Gait belt Activity Tolerance: Patient limited by lethargy Patient left: in chair;with call bell/phone within reach;with chair alarm set Nurse Communication: Mobility status PT Visit Diagnosis: Other abnormalities of gait and mobility (R26.89);Muscle weakness (generalized) (M62.81);History of falling  (Z91.81);Unsteadiness on feet (R26.81)    Time: 0932-6712 PT Time Calculation (min) (ACUTE ONLY): 30 min   Charges:   PT Evaluation $PT Eval Moderate Complexity: 1 Mod PT Treatments $Gait Training: 8-22 mins        Shontavia Mickel Pam Drown, PT Acute Rehabilitation Services Pager: [phone] Office: [place] 05/04/2018, 9:10 AM

## 2018-05-04 NOTE — Progress Notes (Signed)
PROGRESS NOTE    [name]  ZOX:[id number] DOB: 1932-05-13 DOA: 05/03/2018 PCP: [name], MD   Brief Narrative: Anthony Agar Sr. is a 84 y.o. male with medical history significant of RLS; pulmonary HTN; pacemaker placement; afib on Eliquis; OSA on CPAP; HTN; HLD; CAD; stage 3 CKD; and CHF (EF 25-30% in 6/19) presenting with SOB. He has had symptoms for a couple of days. He has been breaking out in a rash and itching all over, as well.  The rash has been present for a couple of days. +LE edema.  Weight increased from 179-191.  No orthopnea.  He wears 2L home O2, but this AM his sats were 84% on 2L.  ED Course:  CHF exacerbation.  SOB, edema, weight gain. He had an LV lead placed and symptoms since.  He went on a cruise and got symptoms there, received Lasix.  On 2L home O2, sats in 70s today so increased O2.  Bedside ultrasound without obvious pericardial effusion.  Petechiae on LE, new x days - platelets 118.  He was started on Doxy 3 days ago for ?MRSA rash - ?related to doxy.  Assessment & Plan:   Principal Problem:   Acute on chronic respiratory failure with hypoxia (HCC) Active Problems:   OSA on CPAP   BPH (benign prostatic hyperplasia)   Chronic atrial fibrillation (HCC)   Acute renal failure superimposed on stage 3 chronic kidney disease (HCC)   Biventricular cardiac pacemaker  st Judes  dual chamber with LV in atrial port    Acute on chronic systolic heart failure (HCC)   Petechial rash  Acute on chronic hypoxic and hypercapnic respiratory failure, secondary to CHF exacerbation.  Presents with hypoxemia, BNP 1865. AKI.  Started on IV lasix. He will received a dose of metolazone today.  Patient notice to be more lethargic, confuse. Blood gas showed PH 7.2 PCO2 74, po2 ; 78 Patient will be transfer to step down unit, will consult CCM.    Acute on chronic systolic heart failure exacerbation;  Presents with hypoxemia, BNP elevated, chest x ray;  cardiomegaly, mild vascular congestion , interstitial edema.  Continue with IV lasix  , metolazone started by cardiology   Acute on chronic renal failure stage II  Suspect related to heart failure.  Continue with IV lasix.  Repeat renal function in am.  Nephrology consulted per wife request.  Urine out put 1.8 L yesterday.  Bladder scan now, if retention will need foley   Acute metabolic encephalopathy;  Notice to be more lethargic and confuse.  ABG PCO at 74, Ph 7.2.  Start BIPAP. Repeat blood gas in one hour.  Discontinue schedule tramadol.  Hold Remeron.  Ammonia mildly elevated at 42. Started on lactulose.   A fib; not on anticoagulation due to GI bleed history. Biventricular pacer placement  Pancytopenia;  3 months ago he had low WBC, and low hb. Platelet count also mildly low.  Check b 12 level. Peripheral smear. LDH   Rash; -Patient was recently started on Doxy for rash - unclear if that was for current petechial rash or if the current rash developed subsequent to starting doxy Holding doxy.  Monitor.   BPH; Continue Avodart and Flomax  OSA on CPAP   DVT prophylaxis: lovenox Code Status: full code.  Family Communication: wife at bedside.  Disposition Plan: transfer to step down unit, place on BIPAP./ continue with diuresis. Patient is critically ill.    Consultants:   Cardiology  Pulmonology  Nephrology    Procedures:   Renal US   Antimicrobials:   none   Subjective: Patient kept eyes close, answer questions, confuse.  Denies worsening dyspnea. Denies chest pain. Didn't use CPAP last night.  More lethargic today   Objective: Vitals:   05/03/18 1230 05/03/18 1318 05/03/18 1917 05/04/18 0443  BP: 117/73 112/71 121/63 113/66  Pulse: 69 69 83 73  Resp: (!) 21 18 16 18   Temp:  (!) 97.4 F (36.3 C) 97.8 F (36.6 C) 97.6 F (36.4 C)  TempSrc:  Oral Oral Oral  SpO2: 100% 99% 96% 92%  Weight:  87.8 kg  88.1 kg  Height:  5\' 7"  (1.702 m)       Intake/Output Summary (Last 24 hours) at 05/04/2018 0729 Last data filed at 05/04/2018 7169 Gross per 24 hour  Intake 240 ml  Output 1800 ml  Net -1560 ml   Filed Weights   05/03/18 0349 05/03/18 1318 05/04/18 0443  Weight: 88.9 kg 87.8 kg 88.1 kg    Examination:  General exam: lethargic.  Respiratory system: Bilateral crackles.  Cardiovascular system: S 1, S 2 RRR Gastrointestinal system; BS present, soft, nt Central nervous system: lethargic, keep eyes close. Follow some command, answer questions.  Extremities:  edema     Data Reviewed: I have personally reviewed following labs and imaging studies  CBC: Recent Labs  Lab 05/03/18 0406 05/04/18 0011  WBC 3.2* 3.4*  NEUTROABS  --  2.3  HGB 8.7* 8.7*  HCT 27.1* 28.4*  MCV 114.3* 116.4*  PLT 118* 678*   Basic Metabolic Panel: Recent Labs  Lab 05/03/18 0406 05/04/18 0011  NA 139 140  K 4.0 4.2  CL 96* 100  CO2 32 31  GLUCOSE 97 109*  BUN 70* 71*  CREATININE 1.94* 1.95*  CALCIUM 9.0 8.8*   GFR: Estimated Creatinine Clearance: 29.9 mL/min (A) (by C-G formula based on SCr of 1.95 mg/dL (H)). Liver Function Tests: No results for input(s): AST, ALT, ALKPHOS, BILITOT, PROT, ALBUMIN in the last 168 hours. No results for input(s): LIPASE, AMYLASE in the last 168 hours. No results for input(s): AMMONIA in the last 168 hours. Coagulation Profile: No results for input(s): INR, PROTIME in the last 168 hours. Cardiac Enzymes: No results for input(s): CKTOTAL, CKMB, CKMBINDEX, TROPONINI in the last 168 hours. BNP (last 3 results) No results for input(s): PROBNP in the last 8760 hours. HbA1C: No results for input(s): HGBA1C in the last 72 hours. CBG: No results for input(s): GLUCAP in the last 168 hours. Lipid Profile: No results for input(s): CHOL, HDL, LDLCALC, TRIG, CHOLHDL, LDLDIRECT in the last 72 hours. Thyroid Function Tests: No results for input(s): TSH, T4TOTAL, FREET4, T3FREE, THYROIDAB in the last 72  hours. Anemia Panel: No results for input(s): VITAMINB12, FOLATE, FERRITIN, TIBC, IRON, RETICCTPCT in the last 72 hours. Sepsis Labs: No results for input(s): PROCALCITON, LATICACIDVEN in the last 168 hours.  No results found for this or any previous visit (from the past 240 hour(s)).       Radiology Studies: Dg Chest 2 View  Result Date: 05/03/2018 CLINICAL DATA:  84 year old male with CHF. EXAM: CHEST - 2 VIEW COMPARISON:  Chest radiograph dated 03/30/2018 FINDINGS: There is cardiomegaly with mild vascular congestion and probable mild edema. No focal consolidation, pleural effusion, or pneumothorax. There is atherosclerotic calcification of the aorta. The aorta is tortuous. Left pectoral pacemaker device. Osteopenia with degenerative changes of the spine. No acute osseous pathology. IMPRESSION: Cardiomegaly with mild vascular  congestion and probable mild interstitial edema. Electronically Signed   By: Anner Crete M.D.   On: 05/03/2018 04:53        Scheduled Meds: . allopurinol  200 mg Oral Daily  . aspirin EC  81 mg Oral Daily  . dutasteride  0.5 mg Oral Daily  . enoxaparin (LOVENOX) injection  30 mg Subcutaneous Q24H  . escitalopram  5 mg Oral QHS  . furosemide  80 mg Intravenous Q12H  . Influenza vac split quadrivalent PF  0.5 mL Intramuscular Tomorrow-1000  . mirtazapine  15 mg Oral QHS  . multivitamin  1 tablet Oral Daily  . pantoprazole  40 mg Oral Daily  . rOPINIRole  4 mg Oral QHS  . sodium chloride flush  3 mL Intravenous Q12H  . tamsulosin  0.4 mg Oral QPC breakfast  . traMADol  100 mg Oral BID   Continuous Infusions: . sodium chloride       LOS: 1 day    Time spent: 35 minutes.     Elmarie Shiley, MD [facility] Pager [phone]  If 7PM-7AM, please contact night-coverage [email or web address] Password [facility] 05/04/2018, 7:29 AM

## 2018-05-05 DIAGNOSIS — I482 Chronic atrial fibrillation: Secondary | ICD-10-CM

## 2018-05-05 DIAGNOSIS — N183 Chronic kidney disease, stage 3 (moderate): Secondary | ICD-10-CM

## 2018-05-05 DIAGNOSIS — Z87891 Personal history of nicotine dependence: Secondary | ICD-10-CM

## 2018-05-05 DIAGNOSIS — J9601 Acute respiratory failure with hypoxia: Secondary | ICD-10-CM

## 2018-05-05 DIAGNOSIS — D61818 Other pancytopenia: Secondary | ICD-10-CM

## 2018-05-05 DIAGNOSIS — I509 Heart failure, unspecified: Secondary | ICD-10-CM

## 2018-05-05 DIAGNOSIS — I130 Hypertensive heart and chronic kidney disease with heart failure and stage 1 through stage 4 chronic kidney disease, or unspecified chronic kidney disease: Principal | ICD-10-CM

## 2018-05-05 LAB — HEPATIC FUNCTION PANEL
ALBUMIN: 2.9 g/dL — AB (ref 3.5–5.0)
ALT: 35 U/L (ref 0–44)
AST: 27 U/L (ref 15–41)
Alkaline Phosphatase: 74 U/L (ref 38–126)
BILIRUBIN DIRECT: 0.6 mg/dL — AB (ref 0.0–0.2)
BILIRUBIN INDIRECT: 0.7 mg/dL (ref 0.3–0.9)
BILIRUBIN TOTAL: 1.3 mg/dL — AB (ref 0.3–1.2)
Total Protein: 6.4 g/dL — ABNORMAL LOW (ref 6.5–8.1)

## 2018-05-05 LAB — RENAL FUNCTION PANEL
Albumin: 3 g/dL — ABNORMAL LOW (ref 3.5–5.0)
Anion gap: 8 (ref 5–15)
BUN: 74 mg/dL — AB (ref 8–23)
CALCIUM: 8.8 mg/dL — AB (ref 8.9–10.3)
CO2: 34 mmol/L — AB (ref 22–32)
CREATININE: 1.95 mg/dL — AB (ref 0.61–1.24)
Chloride: 98 mmol/L (ref 98–111)
GFR calc non Af Amer: 30 mL/min — ABNORMAL LOW (ref >60)
GFR, EST AFRICAN AMERICAN: 35 mL/min — AB (ref >60)
GLUCOSE: 88 mg/dL (ref 70–99)
Phosphorus: 4.4 mg/dL (ref 2.5–4.6)
Potassium: 3.6 mmol/L (ref 3.5–5.1)
SODIUM: 140 mmol/L (ref 135–145)

## 2018-05-05 LAB — KAPPA/LAMBDA LIGHT CHAINS
KAPPA FREE LGHT CHN: 111.7 mg/L — AB (ref 3.3–19.4)
KAPPA, LAMDA LIGHT CHAIN RATIO: 1.39 (ref 0.26–1.65)
Lambda free light chains: 80.4 mg/L — ABNORMAL HIGH (ref 5.7–26.3)

## 2018-05-05 LAB — CBC
HEMATOCRIT: 26 % — AB (ref 39.0–52.0)
Hemoglobin: 8.1 g/dL — ABNORMAL LOW (ref 13.0–17.0)
MCH: 36.7 pg — AB (ref 26.0–34.0)
MCHC: 31.2 g/dL (ref 30.0–36.0)
MCV: 117.6 fL — AB (ref 78.0–100.0)
PLATELETS: 106 10*3/uL — AB (ref 150–400)
RBC: 2.21 MIL/uL — ABNORMAL LOW (ref 4.22–5.81)
RDW: 17.5 % — ABNORMAL HIGH (ref 11.5–15.5)
WBC: 3.8 10*3/uL — ABNORMAL LOW (ref 4.0–10.5)

## 2018-05-05 LAB — URINALYSIS, COMPLETE (UACMP) WITH MICROSCOPIC
BILIRUBIN URINE: NEGATIVE
Bacteria, UA: NONE SEEN
Glucose, UA: NEGATIVE mg/dL
Hgb urine dipstick: NEGATIVE
KETONES UR: NEGATIVE mg/dL
Leukocytes, UA: NEGATIVE
Nitrite: NEGATIVE
Protein, ur: NEGATIVE mg/dL
SPECIFIC GRAVITY, URINE: 1.008 (ref 1.005–1.030)
pH: 5 (ref 5.0–8.0)

## 2018-05-05 LAB — PROTEIN / CREATININE RATIO, URINE
CREATININE, URINE: 32.02 mg/dL
PROTEIN CREATININE RATIO: 0.25 mg/mg{creat} — AB (ref 0.00–0.15)
TOTAL PROTEIN, URINE: 8 mg/dL

## 2018-05-05 LAB — IRON AND TIBC
IRON: 45 ug/dL (ref 45–182)
Saturation Ratios: 12 % — ABNORMAL LOW (ref 17.9–39.5)
TIBC: 378 ug/dL (ref 250–450)
UIBC: 333 ug/dL

## 2018-05-05 LAB — SODIUM, URINE, RANDOM: Sodium, Ur: 83 mmol/L

## 2018-05-05 LAB — AMMONIA: AMMONIA: 19 umol/L (ref 9–35)

## 2018-05-05 LAB — CREATININE, URINE, RANDOM: Creatinine, Urine: 32.03 mg/dL

## 2018-05-05 MED ORDER — SODIUM CHLORIDE 0.9 % IV SOLN
510.0000 mg | Freq: Once | INTRAVENOUS | Status: AC
  Administered 2018-05-06: 510 mg via INTRAVENOUS
  Filled 2018-05-05: qty 17

## 2018-05-05 MED ORDER — LACTULOSE 10 GM/15ML PO SOLN
20.0000 g | Freq: Every day | ORAL | Status: DC
  Administered 2018-05-06 – 2018-05-11 (×6): 20 g via ORAL
  Filled 2018-05-05 (×6): qty 30

## 2018-05-05 NOTE — Progress Notes (Addendum)
Pt c/o of pain voiding. Pt voided 100 cc of urine. Md aware. Pt's bed soaked and bladder scan obtained showing no urine.  Pt cleaned up. Pt currently resting in bed and wants to sleep.  Will cont to monitor pt.

## 2018-05-05 NOTE — Progress Notes (Signed)
Pt taken off BiPAP and was put on nasal cannula. 02 sat at 97% on 2L. Will cont to monitor pt.

## 2018-05-05 NOTE — Progress Notes (Signed)
NAME:  Anthony Cassedy., MRN:  [id number], DOB:  [birth year], LOS: 2 ADMISSION DATE:  05/03/2018, CONSULTATION DATE:  9/26 REFERRING MD:  Dr. Tyrell Antonio, CHIEF COMPLAINT:  SOB   Brief History   84 year old male with cardiac history and EF 25-30% admitted 9/25 with shortness of breath presumably due to CHF exacerbation. He was treated with IV diuresis and did not improve as expected. Dyspnea worsened on 9/26 causing him to require BiPAP. He was transferred to SDU and PCCM was consulted.   Past Medical History  NICM with HFrEF 25-30%, COPD on home O2, OSA on CPAP, CKD III, HTN, and permanent atrial fib.  Significant Hospital Events   9/25 admit 9/26 transfer to SDU on BiPAP for hypercarbia in the setting of pulmonary edema and decompensated OSA 9/27 much more awake. Off BiPAP all morning. On now for nap only.   Consults: date of consult/date signed off & final recs:  Cardiology  Procedures (surgical and bedside):    Significant Diagnostic Tests:  6/7 Echo LVEF 25-30%, areas of akinesis, LA dilation, RV dilation, RA dilation, mod TR, Pa peak pressure 63.   Micro Data:    Antimicrobials:    Subjective:  Feeling better today. Been off BiPAP all morning and tolerating well.   Objective   Blood pressure (!) 118/55, pulse 82, temperature 98 F (36.7 C), temperature source Oral, resp. rate (!) 21, height 5\' 7"  (1.702 m), weight 88.1 kg, SpO2 99 %.        Intake/Output Summary (Last 24 hours) at 05/05/2018 1427 Last data filed at 05/05/2018 1304 Gross per 24 hour  Intake 480 ml  Output 2425 ml  Net -1945 ml   Filed Weights   05/03/18 1318 05/04/18 0443 05/05/18 0440  Weight: 87.8 kg 88.1 kg 88.1 kg    Examination: General: Elderly male on BiPAP HENT: [place]/AT, PERRL, BiPAP mask in place. Lungs: More clear today. Unlabored Cardiovascular: RRR, no MRG Abdomen: Soft, non-tender, non-distended Extremities: No acute deformity or ROM limitation.  Neuro: Sleeping, but easily  arouses to verbal. Then alert, oriented.  GU: Scrotal edema improved.   Resolved Hospital Problem list     Assessment & Plan:   Acute on chronic hypoxemic and hypercarbic respiratory failure: On 2 L [place] at home. Presumably this is due to pulmonary edema in the setting of decompensated CHF. Quit smoking 40-50 years ago. Much improved after starting BiPAP - Supplemental O2 to keep SpO2 88-95% - BiPAP when sleeping, once acute illness improving cant transition back to CPAP - Diuresis per cardiology  Acute metabolic encephalopathy: improved. Secondary to hypercarbia.  - BiPAP PRN as above.   Acute on chronic HFrEF (LVEF 25-30% with pulmonary hypertension and severe TR) - Telemetry monitoring - Cardiology following  OSA on CPAP - BiPAP QHS until acute episode resolves, than can resume home CPAP - Will need outpatient sleep f/u with Dr. Claiborne Billings    CKD III - Per primary   Disposition / Summary of Today's Plan 05/05/18   Much more awake and comfortable today. BiPAP when sleeping, can be transitioned back to CPAP once acute episode resolving. PCCM will sign off.     Diet: Heart Pain/Anxiety/Delirium protocol (if indicated): n/a VAP protocol (if indicated): n/a DVT prophylaxis: enoxaparin  GI prophylaxis: N/a Hyperglycemia protocol: n/a Mobility: up with assist Code Status: Full Family Communication: patient and wife updated at bedside.  Labs   CBC: Recent Labs  Lab 05/03/18 0406 05/04/18 0011 05/05/18 0416  WBC 3.2* 3.4*  3.8*  NEUTROABS  --  2.3  --   HGB 8.7* 8.7* 8.1*  HCT 27.1* 28.4* 26.0*  MCV 114.3* 116.4* 117.6*  PLT 118* 113* 106*    Basic Metabolic Panel: Recent Labs  Lab 05/03/18 0406 05/04/18 0011 05/05/18 0416  NA 139 140 140  K 4.0 4.2 3.6  CL 96* 100 98  CO2 32 31 34*  GLUCOSE 97 109* 88  BUN 70* 71* 74*  CREATININE 1.94* 1.95* 1.95*  CALCIUM 9.0 8.8* 8.8*  PHOS  --   --  4.4   GFR: Estimated Creatinine Clearance: 29.9 mL/min (A) (by C-G  formula based on SCr of 1.95 mg/dL (H)). Recent Labs  Lab 05/03/18 0406 05/04/18 0011 05/05/18 0416  WBC 3.2* 3.4* 3.8*    Liver Function Tests: Recent Labs  Lab 05/05/18 0416 05/05/18 0749  AST  --  27  ALT  --  35  ALKPHOS  --  74  BILITOT  --  1.3*  PROT  --  6.4*  ALBUMIN 3.0* 2.9*   No results for input(s): LIPASE, AMYLASE in the last 168 hours. Recent Labs  Lab 05/04/18 1255 05/05/18 1110  AMMONIA 42* 19    ABG    Component Value Date/Time   PHART 7.305 (L) 05/04/2018 1605   PCO2ART 68.7 (HH) 05/04/2018 1605   PO2ART 76.1 (L) 05/04/2018 1605   HCO3 33.5 (H) 05/04/2018 1605   TCO2 28 01/12/2018 0935   ACIDBASEDEF 1.0 01/12/2018 0935   O2SAT 93.9 05/04/2018 1605     Coagulation Profile: No results for input(s): INR, PROTIME in the last 168 hours.  Cardiac Enzymes: No results for input(s): CKTOTAL, CKMB, CKMBINDEX, TROPONINI in the last 168 hours.  HbA1C: Hgb A1c MFr Bld  Date/Time Value Ref Range Status  01/12/2018 09:31 AM 5.2 4.8 - 5.6 % Final    Comment:    (NOTE) Pre diabetes:          5.7%-6.4% Diabetes:              >6.4% Glycemic control for   <7.0% adults with diabetes   11/03/2017 04:07 PM 4.5 (L) 4.8 - 5.6 % Final    Comment:    (NOTE) Pre diabetes:          5.7%-6.4% Diabetes:              >6.4% Glycemic control for   <7.0% adults with diabetes     CBG: Recent Labs  Lab 05/04/18 1207  GLUCAP 139*    Admitting History of Present Illness.    84 year old male with PMH as below, which is significant for NICM with HFrEF 25-30%, COPD on home O2, OSA on CPAP, CKD III, HTN, and permanent atrial fib. He was admitted recently in August of this year and had his pacemaker upgraded. Overall he has been admitted 3 times in the past 6 months for reasons related to heart failure  He presented again to [facility] ED on 9/25 with complaints of scrotal edema and shortness of breath. His weight was found to be up 12 pound from his prior  discharge 8/22. CXR demonstrated vascular congestion and he was admitted to the to the hospitalists for management. Cardiology was consulted and managed diuresis, which was the bulk of his therapy. Despite a negative fluid balance, his respiratory status worsened on 9/26 requiring him to be placed on BiPAP. He was transferred to SDU and PCCM was asked to evaluate.     [name], AGACNP-BC [place]  Pulmonary/Critical Care Pager [phone] or [phone]  05/05/2018 2:27 PM

## 2018-05-05 NOTE — Consult Note (Signed)
Referral MD  Reason for Referral: Pancytopenia with marked elevation of MCV; renal failure, as of heart failure, chronic A. fib.  Chief Complaint  Patient presents with  . Congestive Heart Failure  . Groin Swelling  : I just got weak.  HPI: [name] is a very nice 84 year old white male.  He is actually a church member of mine.  I found out that he and his wife go to the same church that I do in [place].  They have not been there in a while.  He has multiple health problems.  He has had a history of GI bleeding.  It sounds like he has AV malformations.  He was admitted couple days ago.  He had a lot of swelling in the scrotal area.  When he was admitted on 25 September, his BUN was 70 creatinine 1.94.  His white cell count was 3.2.  Hemoglobin 8.7.  Platelet count 118,000.  MCV is 114.  He had a relatively normal white cell differential.  So far, there has not been much of a work-up for this.  His iron studies done.  His ferritin was 36 with an iron saturation of 10% in September 2016.  Going back to the records, this pancytopenia was not new.  Back in March, his white cell count was 3.9.  Hemoglobin 8.5.  Platelet count 79,000.  He is never seen a hematologist.  He has had bleeding before.  He has had GI bleeding.  Looks like he has AV malformations from what I can tell.  He is not eating all that well.  He does not smoke.  He does not drink.  He has had no obvious occupational exposures.  It is hard to tell if he has lost weight.  Currently, his performance status is ECOG 3.     Past Medical History:  Diagnosis Date  . Arthritis    "feet" (09/30/2017)  . CHF (congestive heart failure) ([place])   . Chronic bronchitis ([place])   . CKD (chronic kidney disease), stage III ([place])    [name] 09/30/2017  . Coronary artery disease   . Diverticulitis   . Dyspnea   . GERD (gastroesophageal reflux disease)   . Gout   . History of blood transfusion    "blood loss" (09/30/2017)  .  Hyperlipidemia   . Hypertension   . MI (myocardial infarction) ([place]) ~ 2000   "light one"  . Nonischemic cardiomyopathy (HCC)    mild  . OSA on CPAP   . Pacemaker single chamber, [place], 2013 10/13/2011  . Permanent atrial fibrillation (HCC)    on Eliquis  . Presence of permanent cardiac pacemaker   . Pulmonary hypertension ([place])   . Restless legs   :  Past Surgical History:  Procedure Laterality Date  . APPENDECTOMY  12/2000   [name] 12/22/2010  . BIV UPGRADE N/A 02/22/2018   Procedure: BIV UPGRADE;  Surgeon: [name], MD;  Location: [place] CV LAB;  Service: Cardiovascular;  Laterality: N/A;  . BIV UPGRADE N/A 03/29/2018   Procedure: BIV PPM UPGRADE;  Surgeon: [name], MD;  Location: [facility] CV LAB;  Service: Cardiovascular;  Laterality: N/A;  . CARDIAC CATHETERIZATION  11/07/2008   nonischemic cardiomyopathy,pulmonary hypertension  . CATARACT EXTRACTION W/ INTRAOCULAR LENS  IMPLANT, BILATERAL Bilateral   . CIRCUMCISION  12/2005   [name] 12/22/2010  . COLOSTOMY  12/2000   [name] 12/22/2010  . COLOSTOMY REVERSAL  07/2001   [name] 12/22/2010  .  CORONARY ANGIOPLASTY  06/01/1999   successful to ostium of the first diagonal  . ESOPHAGOGASTRODUODENOSCOPY N/A 08/22/2013   Procedure: ESOPHAGOGASTRODUODENOSCOPY (EGD);  Surgeon: [name], MD;  Location: [facility] ENDOSCOPY;  Service: Endoscopy;  Laterality: N/A;  h/p in file cabinet, jackie  . ESOPHAGOGASTRODUODENOSCOPY N/A 11/05/2014   Procedure: ESOPHAGOGASTRODUODENOSCOPY (EGD);  Surgeon: Clarene Essex, MD;  Location: [facility] ENDOSCOPY;  Service: Endoscopy;  Laterality: N/A;  . ESOPHAGOGASTRODUODENOSCOPY N/A 11/08/2016   Procedure: ESOPHAGOGASTRODUODENOSCOPY (EGD);  Surgeon: [name], MD;  Location: [facility] ENDOSCOPY;  Service: Endoscopy;  Laterality: N/A;  . ESOPHAGOGASTRODUODENOSCOPY (EGD) WITH PROPOFOL Left 11/05/2017   Procedure: ESOPHAGOGASTRODUODENOSCOPY (EGD) WITH PROPOFOL;  Surgeon: [name], MD;   Location: [facility];  Service: Endoscopy;  Laterality: Left;  . HOT HEMOSTASIS N/A 11/05/2014   Procedure: HOT HEMOSTASIS (ARGON PLASMA COAGULATION/BICAP);  Surgeon: Clarene Essex, MD;  Location: [facility] ENDOSCOPY;  Service: Endoscopy;  Laterality: N/A;  . INSERT / REPLACE / REMOVE PACEMAKER    . JOINT REPLACEMENT    . MASS EXCISION Left    hand w/ulnar artery reconstruction/notes 12/22/2010  . NM MYOCAR PERF WALL MOTION  11/24/2007   normal  . PERMANENT PACEMAKER INSERTION  10/04/2012   [employer]  . PERMANENT PACEMAKER INSERTION N/A 10/12/2011   Procedure: PERMANENT PACEMAKER INSERTION;  Surgeon: [name], MD;  Location: [place] CATH LAB;  Service: Cardiovascular;  Laterality: N/A;  . REPLACEMENT TOTAL KNEE Bilateral 11/2006   right-left/notes 12/22/2010  . ROTATOR CUFF REPAIR Right 09/2004   [name] 12/22/2010  . SAVORY DILATION N/A 08/22/2013   Procedure: SAVORY DILATION;  Surgeon: [name], MD;  Location[facility] ENDOSCOPY;  Service: Endoscopy;  Laterality: N/A;  . SHOULDER SURGERY Right    "fell off house; messed up 3 things in my arm"  . TONSILLECTOMY    . US ECHOCARDIOGRAPHY  02/01/2011   LA is mod-severely dilated,AOV & root sclerotic,ca+ AOV leaflets  :   Current Facility-Administered Medications:  .  0.9 %  sodium chloride infusion, 250 mL, Intravenous, PRN, Regalado, Belkys A, MD .  acetaminophen (TYLENOL) tablet 650 mg, 650 mg, Oral, Q4H PRN, Regalado, Belkys A, MD, 650 mg at 05/05/18 1308 .  allopurinol (ZYLOPRIM) tablet 200 mg, 200 mg, Oral, Daily, Regalado, Belkys A, MD, 200 mg at 05/05/18 1030 .  aspirin EC tablet 81 mg, 81 mg, Oral, Daily, Regalado, Belkys A, MD, 81 mg at 05/05/18 1030 .  diphenhydrAMINE-zinc acetate (BENADRYL) 2-0.1 % cream, , Topical, BID PRN, Regalado, Belkys A, MD .  dutasteride (AVODART) capsule 0.5 mg, 0.5 mg, Oral, Daily, Regalado, Belkys A, MD, 0.5 mg at 05/05/18 1029 .  enoxaparin (LOVENOX) injection 30 mg, 30 mg, Subcutaneous, Q24H, Regalado,  Belkys A, MD, 30 mg at 05/05/18 1029 .  escitalopram (LEXAPRO) tablet 5 mg, 5 mg, Oral, QHS, Regalado, Belkys A, MD, 5 mg at 05/04/18 2234 .  furosemide (LASIX) injection 80 mg, 80 mg, Intravenous, Q8H, Regalado, Belkys A, MD, 80 mg at 05/05/18 1317 .  Influenza vac split quadrivalent PF (FLUZONE HIGH-DOSE) injection 0.5 mL, 0.5 mL, Intramuscular, Tomorrow-1000, Regalado, Belkys A, MD .  Derrill Memo ON 05/06/2018] lactulose (CHRONULAC) 10 GM/15ML solution 20 g, 20 g, Oral, Daily, Regalado, Belkys A, MD .  metolazone (ZAROXOLYN) tablet 2.5 mg, 2.5 mg, Oral, Daily, Regalado, Belkys A, MD, 2.5 mg at 05/05/18 1048 .  multivitamin (PROSIGHT) tablet 1 tablet, 1 tablet, Oral, Daily, Regalado, Belkys A, MD, 1 tablet at 05/05/18 1030 .  ondansetron (ZOFRAN) injection 4 mg, 4 mg, Intravenous, Q6H PRN,  Regalado, Belkys A, MD .  pantoprazole (PROTONIX) EC tablet 40 mg, 40 mg, Oral, Daily, Regalado, Belkys A, MD, 40 mg at 05/05/18 1030 .  rOPINIRole (REQUIP) tablet 4 mg, 4 mg, Oral, QHS, Regalado, Belkys A, MD, 4 mg at 05/04/18 2233 .  sodium chloride flush (NS) 0.9 % injection 3 mL, 3 mL, Intravenous, Q12H, Regalado, Belkys A, MD, 3 mL at 05/05/18 1031 .  sodium chloride flush (NS) 0.9 % injection 3 mL, 3 mL, Intravenous, PRN, Regalado, Belkys A, MD .  tamsulosin (FLOMAX) capsule 0.4 mg, 0.4 mg, Oral, QPC breakfast, Regalado, Belkys A, MD, 0.4 mg at 05/05/18 1032:  . allopurinol  200 mg Oral Daily  . aspirin EC  81 mg Oral Daily  . dutasteride  0.5 mg Oral Daily  . enoxaparin (LOVENOX) injection  30 mg Subcutaneous Q24H  . escitalopram  5 mg Oral QHS  . furosemide  80 mg Intravenous Q8H  . Influenza vac split quadrivalent PF  0.5 mL Intramuscular Tomorrow-1000  . [START ON 05/06/2018] lactulose  20 g Oral Daily  . metolazone  2.5 mg Oral Daily  . multivitamin  1 tablet Oral Daily  . pantoprazole  40 mg Oral Daily  . rOPINIRole  4 mg Oral QHS  . sodium chloride flush  3 mL Intravenous Q12H  . tamsulosin  0.4  mg Oral QPC breakfast  :  Allergies  Allergen Reactions  . Vicodin [Hydrocodone-Acetaminophen] Itching and Nausea Only  :  Family History  Problem Relation Age of Onset  . Cancer Mother   . Heart attack Father   . Diabetes Brother   :  Social History   Socioeconomic History  . Marital status: Married    Spouse name: Not on file  . Number of children: Not on file  . Years of education: Not on file  . Highest education level: Not on file  Occupational History  . Occupation: retired  Scientific laboratory technician  . Financial resource strain: Not on file  . Food insecurity:    Worry: Not on file    Inability: Not on file  . Transportation needs:    Medical: Not on file    Non-medical: Not on file  Tobacco Use  . Smoking status: Former Research scientist (life sciences)  . Smokeless tobacco: Never Used  . Tobacco comment: 09/30/2017 "it's been over 36yrsince I smoked anything"  Substance and Sexual Activity  . Alcohol use: No  . Drug use: No  . Sexual activity: Not Currently  Lifestyle  . Physical activity:    Days per week: Not on file    Minutes per session: Not on file  . Stress: Not on file  Relationships  . Social connections:    Talks on phone: Not on file    Gets together: Not on file    Attends religious service: Not on file    Active member of club or organization: Not on file    Attends meetings of clubs or organizations: Not on file    Relationship status: Not on file  . Intimate partner violence:    Fear of current or ex partner: Not on file    Emotionally abused: Not on file    Physically abused: Not on file    Forced sexual activity: Not on file  Other Topics Concern  . Not on file  Social History Narrative  . Not on file  :  Pertinent items are noted in HPI.  Exam: See above Patient Vitals for the past 24 hrs:  BP Temp Temp src Pulse Resp SpO2 Weight  05/05/18 1549 96/60 - Oral 73 - 94 % -  05/05/18 1300 - 98 F (36.7 C) Oral - - - -  05/05/18 1243 (!) 118/55 - - 82 - 99 % -   05/05/18 0943 (!) 103/59 - - 71 (!) 21 98 % -  05/05/18 0810 - 98 F (36.7 C) Axillary - - - -  05/05/18 0743 112/62 - - 74 (!) 23 96 % -  05/05/18 0440 127/73 98.2 F (36.8 C) Axillary 97 (!) 25 94 % 194 lb 3.6 oz (88.1 kg)  05/05/18 0025 108/64 97.9 F (36.6 C) Axillary 74 15 94 % -  05/04/18 2247 - - - 71 19 96 % -  05/04/18 2000 99/61 97.9 F (36.6 C) Oral 73 18 94 % -  05/04/18 1733 - - - - - 95 % -     Recent Labs    05/04/18 0011 05/05/18 0416  WBC 3.4* 3.8*  HGB 8.7* 8.1*  HCT 28.4* 26.0*  PLT 113* 106*   Recent Labs    05/04/18 0011 05/05/18 0416  NA 140 140  K 4.2 3.6  CL 100 98  CO2 31 34*  GLUCOSE 109* 88  BUN 71* 74*  CREATININE 1.95* 1.95*  CALCIUM 8.8* 8.8*    Blood smear review: None  Pathology: None    Assessment and Plan: Mr. Hillis is a 84 year old white male.  He has this pancytopenia.  I suspect that he has a multifactorial issue.  He is iron deficient.  I am surprised given that his MCV is so high.  He does not have vitamin B12 deficiency given his normal B12 level that was drawn a couple days ago.  I would have to suspect that his renal insufficiency is a big factor with the anemia.  I suspect his erythropoietin level will be very low.  I suspect that he probably is going to need to be transfused.  However, I will defer this to his primary care team.  Ultimately, he is probably going to need a bone marrow biopsy as he clearly is at risk for myelodysplasia.  Given the markedly elevated MCV in the normal B12 level, myelodysplasia would be the most likely etiology for the leukopenia and thrombocytopenia.  I spoke to he and his wife at length.  They are both very very nice.  Because of health issues, they have not been able to go to church for a couple years.  We will see if radiology can do the bone marrow biopsy for [place] next week.  If he does have significant myelodysplasia, he is not a candidate for any intervention outside of supportive  care.  We will see with the erythropoietin level is.  We will see if he qualifies for Aranesp or Procrit.  Again we will give him iron.  He might need a couple doses.  Oral iron will not work for him.  It was wonderful talking to he and his wife.  I really enjoyed having good fellowship with him.  We will follow along.  Again, I think is probably going to need to be transfused.  I will leave this to the primary care team.  [name], MD  Jeneen Rinks 1:5-7

## 2018-05-05 NOTE — Evaluation (Addendum)
Occupational Therapy Evaluation Patient Details Name: Anthony HOAK Sr. MRN: [id number] DOB: 25-Mar-1932 Today's Date: 05/05/2018    History of Present Illness 84 y.o. male admitted with CHF exacerbation. PMHx: CHF with EF of 25%, NICM, pulmonary HTN, RLS, OSA on CPAP, severe tricuspid regurgitation, permanent Afib with slow ventricular response, pacemaker   Clinical Impression   This 84 yo male admitted with above presents to acute OT with decreased activity endurance and decreased balance thus affecting his safety and independence with basic ADLs. He will benefit from acute OT with follow up at SNF being his best option to build up his strength and endurance for activity since he is main caregiver wife. However if pt declines SNF then [facility] and [facility] are recommended.  Pt sats on 3 liters at rest 95% with ambulation dropped as low as 83% (turned pt up to 4 liters with sats up to 90%)    Follow Up Recommendations  SNF;Supervision/Assistance - 24 hour;Other (comment)(pt reluctant for SNF (if does not agree then [facility], [facility]))    Equipment Recommendations  None recommended by OT       Precautions / Restrictions Precautions Precautions: Fall Precaution Comments: monitor O2 sats Restrictions Weight Bearing Restrictions: No      Mobility Bed Mobility Overal bed mobility: Modified Independent             General bed mobility comments: HOB up and increased time  Transfers Overall transfer level: Needs assistance Equipment used: Rolling walker (2 wheeled) Transfers: Sit to/from Stand Sit to Stand: Min assist         General transfer comment: cues for safety and RW use (however pt did say on one occassion, "I need to get in this walker better"). Pt ambulated 100 feet with stopping x 2 due to decreased sats and to purse lip breath with cues--had to turn pt up to 4 liters to get sats to stay above 88%    Balance Overall balance assessment: Needs assistance Sitting-balance  support: No upper extremity supported;Feet supported Sitting balance-Leahy Scale: Good     Standing balance support: Single extremity supported Standing balance-Leahy Scale: Poor                             ADL either performed or assessed with clinical judgement   ADL Overall ADL's : Needs assistance/impaired Eating/Feeding: Independent;Sitting   Grooming: Set up;Sitting   Upper Body Bathing: Set up;Sitting   Lower Body Bathing: Minimal assistance;Sit to/from stand   Upper Body Dressing : Set up;Sitting   Lower Body Dressing: Minimal assistance;Sit to/from stand   Toilet Transfer: Minimal assistance;Ambulation;Regular Toilet;Grab bars;RW Armed forces technical officer Details (indicate cue type and reason): O2 3 liters Toileting- Clothing Manipulation and Hygiene: Minimal assistance;Sit to/from stand         General ADL Comments: pt needs extra time for all activities due to DOE/SOB      Vision Patient Visual Report: No change from baseline              Pertinent Vitals/Pain Pain Assessment: No/denies pain     Hand Dominance Right   Extremity/Trunk Assessment Upper Extremity Assessment Upper Extremity Assessment: RUE deficits/detail RUE Deficits / Details: Decreased AROM shoulder (pre-exsisting) RUE Coordination: decreased gross motor           Communication Communication Communication: No difficulties   Cognition Arousal/Alertness: (Lethargic intially, but then once up more alert) Behavior During Therapy: Flat affect Overall Cognitive Status: Within Functional Limits  for tasks assessed                                                Home Living Family/patient expects to be discharged to:: Skilled nursing facility Living Arrangements: Spouse/significant other Available Help at Discharge: Family;Available PRN/intermittently Type of Home: House Home Access: Ramped entrance     Home Layout: One level     Bathroom Shower/Tub:  Occupational psychologist: Handicapped height     Home Equipment: Environmental consultant - 2 wheels;Cane - single point;Electric scooter;Crutches;Tub bench;Bedside commode   Additional Comments: 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 Children/grandchildren live nearby and assist PRN; children help care for pt's wife as well      Prior Functioning/Environment Level of Independence: Independent with assistive device(s)        Comments: Intermittent use of SPC or RW. Pt still drives. Assists wife with ADLs. Pt endorses multiple falls at home        OT Problem List: Cardiopulmonary status limiting activity;Impaired balance (sitting and/or standing);Decreased activity tolerance      OT Treatment/Interventions: Self-care/ADL training;Energy conservation;DME and/or AE instruction;Patient/family education;Balance training    OT Goals(Current goals can be found in the care plan section) Acute Rehab OT Goals Patient Stated Goal: return home OT Goal Formulation: With patient Time For Goal Achievement: 05/19/18 Potential to Achieve Goals: Good  OT Frequency: Min 2X/week   Barriers to D/C: Decreased caregiver support             AM-PAC PT "6 Clicks" Daily Activity     Outcome Measure Help from another person eating meals?: None Help from another person taking care of personal grooming?: A Little Help from another person toileting, which includes using toliet, bedpan, or urinal?: A Little Help from another person bathing (including washing, rinsing, drying)?: A Little Help from another person to put on and taking off regular upper body clothing?: A Little Help from another person to put on and taking off regular lower body clothing?: A Little 6 Click Score: 19   End of Session Equipment Utilized During Treatment: Gait belt;Rolling walker(3 liters of O2 up to 4 liters with ambulation) Nurse Communication: (pt's family to call when he takes a nap so RN can put BIpap back on pt)  Activity Tolerance:  Patient limited by fatigue Patient left: in bed;with call bell/phone within reach;with bed alarm set;with family/visitor present  OT Visit Diagnosis: Unsteadiness on feet (R26.81);Other abnormalities of gait and mobility (R26.89)                Time: 1352-1440 OT Time Calculation (min): 48 min Charges:  OT General Charges $OT Visit: 1 Visit OT Evaluation $OT Eval Moderate Complexity: 1 Mod OT Treatments $Self Care/Home Management : 23-37 mins  [address], OTR/L Acute [employer] Pager [phone] Office [phone]

## 2018-05-05 NOTE — Progress Notes (Addendum)
Progress Note  Patient Name: Anthony YONO Sr. Date of Encounter: 05/05/2018  Primary Cardiologist: [name], MD   Subjective   Pt feeling better today. Breathing improved. Pain improved after catheter removal>>urinating better.  Inpatient Medications    Scheduled Meds: . allopurinol  200 mg Oral Daily  . aspirin EC  81 mg Oral Daily  . dutasteride  0.5 mg Oral Daily  . enoxaparin (LOVENOX) injection  30 mg Subcutaneous Q24H  . escitalopram  5 mg Oral QHS  . furosemide  80 mg Intravenous Q8H  . Influenza vac split quadrivalent PF  0.5 mL Intramuscular Tomorrow-1000  . lactulose  30 g Oral BID  . metolazone  2.5 mg Oral Daily  . multivitamin  1 tablet Oral Daily  . pantoprazole  40 mg Oral Daily  . rOPINIRole  4 mg Oral QHS  . sodium chloride flush  3 mL Intravenous Q12H  . tamsulosin  0.4 mg Oral QPC breakfast   Continuous Infusions: . sodium chloride     PRN Meds: sodium chloride, acetaminophen, diphenhydrAMINE-zinc acetate, ondansetron (ZOFRAN) IV, sodium chloride flush   Vital Signs    Vitals:   05/05/18 0025 05/05/18 0440 05/05/18 0743 05/05/18 0810  BP: 108/64 127/73 112/62   Pulse: 74 97 74   Resp: 15 (!) 25 (!) 23   Temp: 97.9 F (36.6 C) 98.2 F (36.8 C)  98 F (36.7 C)  TempSrc: Axillary Axillary  Axillary  SpO2: 94% 94% 96%   Weight:  88.1 kg    Height:        Intake/Output Summary (Last 24 hours) at 05/05/2018 0848 Last data filed at 05/05/2018 0809 Gross per 24 hour  Intake 480 ml  Output 1100 ml  Net -620 ml   Filed Weights   05/03/18 1318 05/04/18 0443 05/05/18 0440  Weight: 87.8 kg 88.1 kg 88.1 kg    Physical Exam   General: Elderly, NAD Skin: Warm, dry, intact  Head: Normocephalic, atraumatic, clear, moist mucus membranes. Neck: Negative for carotid bruits. No JVD Lungs: Left upper and lower lobe crackles. No wheezes. Breathing is unlabored. Cardiovascular: RRR with S1 S2. No murmurs, rubs, gallops, or LV heave  appreciated. Abdomen: Soft, non-tender, non-distended with normoactive bowel sounds. No obvious abdominal masses. MSK: Strength and tone appear normal for age. 5/5 in all extremities Extremities: No edema. No clubbing or cyanosis. DP/PT pulses 2+ bilaterally Neuro: Alert and oriented. No focal deficits. No facial asymmetry. MAE spontaneously. Psych: Responds to questions appropriately with normal affect.    Labs    Chemistry Recent Labs  Lab 05/03/18 0406 05/04/18 0011 05/05/18 0416 05/05/18 0749  NA 139 140 140  --   K 4.0 4.2 3.6  --   CL 96* 100 98  --   CO2 32 31 34*  --   GLUCOSE 97 109* 88  --   BUN 70* 71* 74*  --   CREATININE 1.94* 1.95* 1.95*  --   CALCIUM 9.0 8.8* 8.8*  --   PROT  --   --   --  6.4*  ALBUMIN  --   --  3.0* 2.9*  AST  --   --   --  27  ALT  --   --   --  35  ALKPHOS  --   --   --  74  BILITOT  --   --   --  1.3*  GFRNONAA 30* 30* 30*  --   GFRAA 35* 35* 35*  --  ANIONGAP 11 9 8   --      Hematology Recent Labs  Lab 05/03/18 0406 05/04/18 0011 05/05/18 0416  WBC 3.2* 3.4* 3.8*  RBC 2.37* 2.44* 2.21*  HGB 8.7* 8.7* 8.1*  HCT 27.1* 28.4* 26.0*  MCV 114.3* 116.4* 117.6*  MCH 36.7* 35.7* 36.7*  MCHC 32.1 30.6 31.2  RDW 17.4* 17.5* 17.5*  PLT 118* 113* 106*   Cardiac EnzymesNo results for input(s): TROPONINI in the last 168 hours.  Recent Labs  Lab 05/03/18 0412  TROPIPOC 0.03     BNP Recent Labs  Lab 05/03/18 0406  BNP 1,865.0*     DDimer No results for input(s): DDIMER in the last 168 hours.   Radiology    US Renal  Result Date: 05/05/2018 CLINICAL DATA:  Acute renal injury EXAM: RENAL / URINARY TRACT ULTRASOUND COMPLETE COMPARISON:  01/10/2018 FINDINGS: Right Kidney: Length: 12.6 cm. Multiple cysts are identified. The largest of these measures 3.9 cm in greatest dimension and stable from the prior exam. No hydronephrosis is noted. Left Kidney: Length: 13.2 cm. Multiple cysts are noted. The largest of these measures 6.8 cm  in greatest dimension also stable from the previous exam. No hydronephrosis is noted. Bladder: Decompressed Small pleural effusions are noted bilaterally. Minimal ascites is seen as well. IMPRESSION: Bilateral renal cysts stable from the prior CT examination. Bilateral pleural effusions and mild ascites. Electronically Signed   By: Inez Catalina M.D.   On: 05/05/2018 06:12   Telemetry    05/05/18 V-paced - Personally Reviewed  ECG    No new tracings as of 05/05/18 - Personally Reviewed  Cardiac Studies   ECHO: 01/13/2018: Study Conclusions  - Left ventricle: The cavity size was normal. Systolic function was severely reduced. The estimated ejection fraction was in the range of 25% to 30%. There is akinesis of the mid-apicalanteroseptal and inferoseptal myocardium. The study is not technically sufficient to allow evaluation of LV diastolic function. - Aortic valve: Trileaflet; moderately thickened, moderately calcified leaflets. Transvalvular velocity was minimally increased. There was no stenosis. There was mild regurgitation. Peak velocity (S): 233 cm/s. Valve area (VTI): 1.49 cm^2. Valve area (Vmax): 1.6 cm^2. Valve area (Vmean): 1.44 cm^2. - Mitral valve: There was mild regurgitation. - Left atrium: The atrium was severely dilated. - Right ventricle: The cavity size was moderately dilated. Wall thickness was normal. Pacer wire or catheter noted in right ventricle. - Right atrium: The atrium was severely dilated. - Tricuspid valve: There was moderate regurgitation. - Pulmonary arteries: Systolic pressure was moderately increased. PA peak pressure: 53 mm Hg (S).  CATH: None  Patient Profile     84 y.o. male with a hx of chronic systolic heart failure with an EF of 25%, nonischemic cardiomyopathy (per cath 2010), pulmonary hypertension, RLS, OSA on CPAP, severe tricuspid regurgitation, permanent atrial fibrillation with slow ventricular response not on  anticoagulation, single-chamber pacemaker in situ with recent upgrade to CRT-P on 03/29/2018 per Dr. [name] and hypertension who is being seen today for the evaluation of acute CHF exacerbation at the request of Dr. Lorin Mercy.   Assessment & Plan    1. Acute on chronic systolic heart failure/nonischemic cardiomyopathy: -Patient with a known nonischemic cardiomyopathy with LVEF of 25 to 30% with akinesis of mid-apicalanteroseptal and inferoseptal myocardium performed 01/13/2018 with recent BiV pacemaker upgrade on 03/30/2018 per Dr. [name] (initially scheduled 02/2018 however was canceled secondary to inability to control restless leg syndrome).  -Weight, 194lb today>>196lb on admission   -I&O, net negative 2.8L since  admission   -Lasix increased to TID and Metolazone added on 05/04/18 -No BB secondary to prior hypotension and no ACE given CKD  2. Acute on chronic kidney disease stage III -Creatinine, 1.94 today>stable -Baseline appears to be in the 1.4 range -Need to avoid nephrotoxic medications -Follow with daily BMET given the addition of Metolazone  -Nephrology consulted and following   3. Permanent atrial fibrillation -Paced rhythm today with recent CRT-P upgrade per Dr. [name] -No anticoagulation secondary to history of bleeding issues -Last device check 04/12/2018, normal function   4. COPD -On supplemental oxygen>>>PCCM consulted secondary to acute on chronic hypercapnia with improvement after the addition of Bipap overnight  -No beta-blocker secondary to above -Per primary team  Signed, Kathyrn Drown NP-C [place] Pager: [phone] 05/05/2018, 8:48 AM     For questions or updates, please contact   Please consult [email or web address] for contact info under Cardiology/STEMI.  Personally seen and examined. Agree with above.  Had episode of increased shortness of breath, somnolence overnight.  BiPAP transiently.  Nephrology also consulted.  Breathing a little bit better.  Overall  reasonable output today. General elderly in no acute distress.  Mildly increased respiratory effort currently.  Abdomen soft, no significant edema Lab work personally reviewed.  Creatinine remained stable.  BUN slightly increased.  Acute on chronic systolic heart failure - Continue with aggressive IV Lasix 80 3 times a day with low-dose metolazone 2.5 once a day.  Watch electro lites closely. Appreciate renal involvement. -Respiratory failure improved.  Try to continue with diuresis.  Also has underlying COPD.  Avoiding beta-blocker.   Candee Furbish, MD

## 2018-05-05 NOTE — Progress Notes (Signed)
Pt had a bladder scan that showed 900 cc of urine. Dr. Tyrell Antonio ordered a foley. Pt then voided 700 cc and then 500 cc. Md on call made aware. Holding foley for now. Will cont to monitor pt.

## 2018-05-05 NOTE — Progress Notes (Signed)
PROGRESS NOTE    [name]  JJK:[id number] DOB: 1932-07-15 DOA: 05/03/2018 PCP: [name], MD   Brief Narrative: Anthony Agar Sr. is a 84 y.o. male with medical history significant of RLS; pulmonary HTN; pacemaker placement; afib on Eliquis; OSA on CPAP; HTN; HLD; CAD; stage 3 CKD; and CHF (EF 25-30% in 6/19) presenting with SOB. He has had symptoms for a couple of days. He has been breaking out in a rash and itching all over, as well.  The rash has been present for a couple of days. +LE edema.  Weight increased from 179-191.  No orthopnea.  He wears 2L home O2, but this AM his sats were 84% on 2L.  ED Course:  CHF exacerbation.  SOB, edema, weight gain. He had an LV lead placed and symptoms since.  He went on a cruise and got symptoms there, received Lasix.  On 2L home O2, sats in 70s today so increased O2.  Bedside ultrasound without obvious pericardial effusion.  Petechiae on LE, new x days - platelets 118.  He was started on Doxy 3 days ago for ?MRSA rash - ?related to doxy.  Assessment & Plan:   Principal Problem:   Acute on chronic respiratory failure with hypoxia (HCC) Active Problems:   OSA on CPAP   BPH (benign prostatic hyperplasia)   Chronic atrial fibrillation (HCC)   Acute renal failure superimposed on stage 3 chronic kidney disease (HCC)   Biventricular cardiac pacemaker  st Judes  dual chamber with LV in atrial port    Acute on chronic systolic heart failure (HCC)   Petechial rash  Acute on chronic hypoxic and hypercapnic respiratory failure, secondary to CHF exacerbation.  Presents with hypoxemia, BNP 1865. AKI.  Patient notice to be more lethargic, confuse on 9-26. Blood gas showed PH 7.2 PCO2 74, po2 ; 78 Patient was  transfer to step down unit, CCM consulted. Patient placed on BIPAP. Improved. He is more alert, less confuse.  Continue with IV lasix and metolazone.     Acute on chronic systolic heart failure exacerbation;  Presents with  hypoxemia, BNP elevated, chest x ray; cardiomegaly, mild vascular congestion , interstitial edema.  Continue with IV lasix  , metolazone.  Appreciate cardiology assistance.   Acute on chronic renal failure stage II  Suspect related to heart failure.  Continue with IV lasix.  Nephrology consulted per wife request.  Urine out put 1.8 today  He was retaining urine this am, subsequently he was able to urinate 1200. Asked staff to monitor urine out put and perform bladder scan.  Protein electrophoresis ordered   Acute metabolic encephalopathy;  Notice to be more lethargic and confuse 9-26.  ABG PCO at 74, Ph 7.2.  Started BIPAP. Improved on BIPAP> Discontinue schedule tramadol.  Hold Remeron.  Ammonia mildly elevated at 42. Started on lactulose. Ammonia normalized.   A fib; not on anticoagulation due to GI bleed history. Biventricular pacer placement  Pancytopenia;  3 months ago he had low WBC, and low hb. Platelet count also mildly low.  B12 level; 861. Peripheral smear ordered. LDH 313 Dr Marin Olp consulted.   Rash; -Petechial rash  Patient was recently started on Doxy for rash - unclear if that was for current petechial rash or if the current rash developed subsequent to starting doxy Holding doxy.  Monitor.   BPH; Continue Avodart and Flomax  OSA on CPAP   DVT prophylaxis: lovenox Code Status: full code.  Family Communication: wife at  bedside 9-26.  Disposition Plan: remain in the step down, monitor urine out put, continue with IV lasix, pancytopenia evaluation.    Consultants:   Cardiology  Pulmonology  Nephrology    Procedures:   Renal US   Antimicrobials:   none   Subjective: He is more alert. He was complaining of supra-pubic pain. Unable to urinate.  Bladder scan more than 900. Subsequently he was able to urinate.   Objective: Vitals:   05/05/18 0025 05/05/18 0440 05/05/18 0743 05/05/18 0810  BP: 108/64 127/73 112/62   Pulse: 74 97 74   Resp:  15 (!) 25 (!) 23   Temp: 97.9 F (36.6 C) 98.2 F (36.8 C)  98 F (36.7 C)  TempSrc: Axillary Axillary  Axillary  SpO2: 94% 94% 96%   Weight:  88.1 kg    Height:        Intake/Output Summary (Last 24 hours) at 05/05/2018 0841 Last data filed at 05/05/2018 0809 Gross per 24 hour  Intake 480 ml  Output 1100 ml  Net -620 ml   Filed Weights   05/03/18 1318 05/04/18 0443 05/05/18 0440  Weight: 87.8 kg 88.1 kg 88.1 kg    Examination:  General exam: Alert on BIPAP Respiratory system: Bilateral air movement, bilateral crackles Cardiovascular system: S 1, S 2 RRR Gastrointestinal system; supra-pubic tenderness, no rigidity , soft, BS present.  Central nervous system: alert, oriented to person , place, complaining of supra-pubic pain  Extremities: petechial rash     Data Reviewed: I have personally reviewed following labs and imaging studies  CBC: Recent Labs  Lab 05/03/18 0406 05/04/18 0011 05/05/18 0416  WBC 3.2* 3.4* 3.8*  NEUTROABS  --  2.3  --   HGB 8.7* 8.7* 8.1*  HCT 27.1* 28.4* 26.0*  MCV 114.3* 116.4* 117.6*  PLT 118* 113* 706*   Basic Metabolic Panel: Recent Labs  Lab 05/03/18 0406 05/04/18 0011 05/05/18 0416  NA 139 140 140  K 4.0 4.2 3.6  CL 96* 100 98  CO2 32 31 34*  GLUCOSE 97 109* 88  BUN 70* 71* 74*  CREATININE 1.94* 1.95* 1.95*  CALCIUM 9.0 8.8* 8.8*  PHOS  --   --  4.4   GFR: Estimated Creatinine Clearance: 29.9 mL/min (A) (by C-G formula based on SCr of 1.95 mg/dL (H)). Liver Function Tests: Recent Labs  Lab 05/05/18 0416 05/05/18 0749  AST  --  27  ALT  --  35  ALKPHOS  --  74  BILITOT  --  1.3*  PROT  --  6.4*  ALBUMIN 3.0* 2.9*   No results for input(s): LIPASE, AMYLASE in the last 168 hours. Recent Labs  Lab 05/04/18 1255  AMMONIA 42*   Coagulation Profile: No results for input(s): INR, PROTIME in the last 168 hours. Cardiac Enzymes: No results for input(s): CKTOTAL, CKMB, CKMBINDEX, TROPONINI in the last 168  hours. BNP (last 3 results) No results for input(s): PROBNP in the last 8760 hours. HbA1C: No results for input(s): HGBA1C in the last 72 hours. CBG: Recent Labs  Lab 05/04/18 1207  GLUCAP 139*   Lipid Profile: No results for input(s): CHOL, HDL, LDLCALC, TRIG, CHOLHDL, LDLDIRECT in the last 72 hours. Thyroid Function Tests: No results for input(s): TSH, T4TOTAL, FREET4, T3FREE, THYROIDAB in the last 72 hours. Anemia Panel: Recent Labs    05/04/18 1255 05/04/18 1655  VITAMINB12 861  --   FERRITIN  --  36  TIBC  --  377  IRON  --  39*   Sepsis Labs: No results for input(s): PROCALCITON, LATICACIDVEN in the last 168 hours.  Recent Results (from the past 240 hour(s))  MRSA PCR Screening     Status: None   Collection Time: 05/04/18  4:20 PM  Result Value Ref Range Status   MRSA by PCR NEGATIVE NEGATIVE Final    Comment:        The GeneXpert MRSA Assay (FDA approved for NASAL specimens only), is one component of a comprehensive MRSA colonization surveillance program. It is not intended to diagnose MRSA infection nor to guide or monitor treatment for MRSA infections. Performed at [facility], [place] [address], [place], [place] [address]          Radiology Studies: US Renal  Result Date: 05/05/2018 CLINICAL DATA:  Acute renal injury EXAM: RENAL / URINARY TRACT ULTRASOUND COMPLETE COMPARISON:  01/10/2018 FINDINGS: Right Kidney: Length: 12.6 cm. Multiple cysts are identified. The largest of these measures 3.9 cm in greatest dimension and stable from the prior exam. No hydronephrosis is noted. Left Kidney: Length: 13.2 cm. Multiple cysts are noted. The largest of these measures 6.8 cm in greatest dimension also stable from the previous exam. No hydronephrosis is noted. Bladder: Decompressed Small pleural effusions are noted bilaterally. Minimal ascites is seen as well. IMPRESSION: Bilateral renal cysts stable from the prior CT examination. Bilateral pleural effusions  and mild ascites. Electronically Signed   By: Inez Catalina M.D.   On: 05/05/2018 06:12        Scheduled Meds: . allopurinol  200 mg Oral Daily  . aspirin EC  81 mg Oral Daily  . dutasteride  0.5 mg Oral Daily  . enoxaparin (LOVENOX) injection  30 mg Subcutaneous Q24H  . escitalopram  5 mg Oral QHS  . furosemide  80 mg Intravenous Q8H  . Influenza vac split quadrivalent PF  0.5 mL Intramuscular Tomorrow-1000  . lactulose  30 g Oral BID  . metolazone  2.5 mg Oral Daily  . multivitamin  1 tablet Oral Daily  . pantoprazole  40 mg Oral Daily  . rOPINIRole  4 mg Oral QHS  . sodium chloride flush  3 mL Intravenous Q12H  . tamsulosin  0.4 mg Oral QPC breakfast   Continuous Infusions: . sodium chloride       LOS: 2 days    Time spent: 35 minutes.     Elmarie Shiley, MD [facility] Pager [phone]  If 7PM-7AM, please contact night-coverage [email or web address] Password [facility] 05/05/2018, 8:41 AM

## 2018-05-05 NOTE — Progress Notes (Signed)
[facility] ROUNDING NOTE   Subjective:   84 year old gentleman with's chronic systolic heart failure nonischemic cardiomyopathy per cath 2010 EF 25% pulmonary hypertension obstructive sleep apnea on CPAP severe tricuspid regurgitation permanent atrial fibrillation not on anticoagulation single pacemaker in situ 03/29/2018 Dr. [name] history of hypertension  Blood pressure 103/59 pulse 71 irregular O2 sats 97% feeling better today.  Urine output 2.3 L.  Chest x-ray cardiomegaly with mild interstitial edema  Sodium 140 potassium 3.6 chloride 98 CO2 34 BUN 74 creatinine 1.95 phosphorus 4.4 Albumin 3.0 calcium 8.8.  WBC 3.8 hemoglobin 8.1 platelets 106.   Objective:  Vital signs in last 24 hours:  Temp:  [97.6 F (36.4 C)-98.2 F (36.8 C)] 98 F (36.7 C) (09/27 0810) Pulse Rate:  [69-97] 71 (09/27 0943) Resp:  [15-28] 21 (09/27 0943) BP: (99-127)/(59-73) 103/59 (09/27 0943) SpO2:  [93 %-100 %] 98 % (09/27 0943) Weight:  [88.1 kg] 88.1 kg (09/27 0440)  Weight change: 0.329 kg Filed Weights   05/03/18 1318 05/04/18 0443 05/05/18 0440  Weight: 87.8 kg 88.1 kg 88.1 kg    Intake/Output: I/O last 3 completed shifts: In: 480 [P.O.:480] Out: 1450 [Urine:1450]   Intake/Output this shift:  Total I/O In: -  Out: 1775 [Urine:1775]  CVS- RRR JVP not elevated RS- CTA bilateral crackles at bases  ABD- BS present soft non-distended EXT- no edema   Basic Metabolic Panel: Recent Labs  Lab 05/03/18 0406 05/04/18 0011 05/05/18 0416  NA 139 140 140  K 4.0 4.2 3.6  CL 96* 100 98  CO2 32 31 34*  GLUCOSE 97 109* 88  BUN 70* 71* 74*  CREATININE 1.94* 1.95* 1.95*  CALCIUM 9.0 8.8* 8.8*  PHOS  --   --  4.4    Liver Function Tests: Recent Labs  Lab 05/05/18 0416 05/05/18 0749  AST  --  27  ALT  --  35  ALKPHOS  --  74  BILITOT  --  1.3*  PROT  --  6.4*  ALBUMIN 3.0* 2.9*   No results for input(s): LIPASE, AMYLASE in the last 168 hours. Recent Labs  Lab  05/04/18 1255  AMMONIA 42*    CBC: Recent Labs  Lab 05/03/18 0406 05/04/18 0011 05/05/18 0416  WBC 3.2* 3.4* 3.8*  NEUTROABS  --  2.3  --   HGB 8.7* 8.7* 8.1*  HCT 27.1* 28.4* 26.0*  MCV 114.3* 116.4* 117.6*  PLT 118* 113* 106*    Cardiac Enzymes: No results for input(s): CKTOTAL, CKMB, CKMBINDEX, TROPONINI in the last 168 hours.  BNP: Invalid input(s): POCBNP  CBG: Recent Labs  Lab 05/04/18 1207  [place]*    Microbiology: Results for orders placed or performed during the hospital encounter of 05/03/18  MRSA PCR Screening     Status: None   Collection Time: 05/04/18  4:20 PM  Result Value Ref Range Status   MRSA by PCR NEGATIVE NEGATIVE Final    Comment:        The GeneXpert MRSA Assay (FDA approved for NASAL specimens only), is one component of a comprehensive MRSA colonization surveillance program. It is not intended to diagnose MRSA infection nor to guide or monitor treatment for MRSA infections. Performed at [facility], [place] [address], [place], [place] [address]     Coagulation Studies: No results for input(s): LABPROT, INR in the last 72 hours.  Urinalysis: No results for input(s): COLORURINE, LABSPEC, PHURINE, GLUCOSEU, HGBUR, BILIRUBINUR, KETONESUR, PROTEINUR, UROBILINOGEN, NITRITE, LEUKOCYTESUR in the last 72  hours.  Invalid input(s): APPERANCEUR    Imaging: US Renal  Result Date: 05/05/2018 CLINICAL DATA:  Acute renal injury EXAM: RENAL / URINARY TRACT ULTRASOUND COMPLETE COMPARISON:  01/10/2018 FINDINGS: Right Kidney: Length: 12.6 cm. Multiple cysts are identified. The largest of these measures 3.9 cm in greatest dimension and stable from the prior exam. No hydronephrosis is noted. Left Kidney: Length: 13.2 cm. Multiple cysts are noted. The largest of these measures 6.8 cm in greatest dimension also stable from the previous exam. No hydronephrosis is noted. Bladder: Decompressed Small pleural effusions are noted bilaterally. Minimal  ascites is seen as well. IMPRESSION: Bilateral renal cysts stable from the prior CT examination. Bilateral pleural effusions and mild ascites. Electronically Signed   By: Inez Catalina M.D.   On: 05/05/2018 06:12     Medications:   . sodium chloride     . allopurinol  200 mg Oral Daily  . aspirin EC  81 mg Oral Daily  . dutasteride  0.5 mg Oral Daily  . enoxaparin (LOVENOX) injection  30 mg Subcutaneous Q24H  . escitalopram  5 mg Oral QHS  . furosemide  80 mg Intravenous Q8H  . Influenza vac split quadrivalent PF  0.5 mL Intramuscular Tomorrow-1000  . [START ON 05/06/2018] lactulose  20 g Oral Daily  . metolazone  2.5 mg Oral Daily  . multivitamin  1 tablet Oral Daily  . pantoprazole  40 mg Oral Daily  . rOPINIRole  4 mg Oral QHS  . sodium chloride flush  3 mL Intravenous Q12H  . tamsulosin  0.4 mg Oral QPC breakfast   sodium chloride, acetaminophen, diphenhydrAMINE-zinc acetate, ondansetron (ZOFRAN) IV, sodium chloride flush  Assessment/ Plan:   Acute on chronic kidney disease stage III (although baseline serum creatinine appears to be about 1.2 ) with decompensated heart failure hypoxia and hypercarbia.  Appears to have diuresed well creatinine is remained relatively stable.  Renal ultrasound and urinalysis pending  Hypertension/volume appears to be diuresing well on combination of IV Lasix 80 mg every 8 hours and metolazone 2.5 mg daily we will need to watch for changes in electrolytes.  Congestive heart failure with systolic dysfunction EF 48%  BPH continues on Flomax  Restless leg syndrome takes Requip 4 mg at bedtime  Anemia we will check iron studies and evaluate for ESA treatment  Bones will check PTH he has an SPEP and UPEP pending.  Avoid use of nonsteroidal inflammatories Cox 2 inhibitors ACE inhibitors and ARB's.  No IV contrast   LOS: 2 Sherril Croon @TODAY @11 :23 AM

## 2018-05-06 DIAGNOSIS — N179 Acute kidney failure, unspecified: Secondary | ICD-10-CM

## 2018-05-06 LAB — RENAL FUNCTION PANEL
ALBUMIN: 3.2 g/dL — AB (ref 3.5–5.0)
ANION GAP: 9 (ref 5–15)
BUN: 72 mg/dL — AB (ref 8–23)
CHLORIDE: 93 mmol/L — AB (ref 98–111)
CO2: 38 mmol/L — ABNORMAL HIGH (ref 22–32)
Calcium: 8.9 mg/dL (ref 8.9–10.3)
Creatinine, Ser: 1.61 mg/dL — ABNORMAL HIGH (ref 0.61–1.24)
GFR, EST AFRICAN AMERICAN: 44 mL/min — AB (ref >60)
GFR, EST NON AFRICAN AMERICAN: 38 mL/min — AB (ref >60)
GLUCOSE: 93 mg/dL (ref 70–99)
PHOSPHORUS: 3.3 mg/dL (ref 2.5–4.6)
POTASSIUM: 3.2 mmol/L — AB (ref 3.5–5.1)
Sodium: 140 mmol/L (ref 135–145)

## 2018-05-06 LAB — CBC WITH DIFFERENTIAL/PLATELET
BASOS PCT: 1 %
Basophils Absolute: 0 10*3/uL (ref 0.0–0.1)
EOS ABS: 0.1 10*3/uL (ref 0.0–0.7)
Eosinophils Relative: 3 %
HCT: 28.7 % — ABNORMAL LOW (ref 39.0–52.0)
Hemoglobin: 9 g/dL — ABNORMAL LOW (ref 13.0–17.0)
LYMPHS ABS: 0.4 10*3/uL — AB (ref 0.7–4.0)
Lymphocytes Relative: 11 %
MCH: 36.1 pg — AB (ref 26.0–34.0)
MCHC: 31.4 g/dL (ref 30.0–36.0)
MCV: 115.3 fL — ABNORMAL HIGH (ref 78.0–100.0)
MONO ABS: 0.4 10*3/uL (ref 0.1–1.0)
Monocytes Relative: 11 %
NEUTROS PCT: 74 %
Neutro Abs: 2.9 10*3/uL (ref 1.7–7.7)
PLATELETS: 98 10*3/uL — AB (ref 150–400)
RBC: 2.49 MIL/uL — ABNORMAL LOW (ref 4.22–5.81)
RDW: 16.9 % — AB (ref 11.5–15.5)
WBC: 3.8 10*3/uL — ABNORMAL LOW (ref 4.0–10.5)

## 2018-05-06 LAB — PARATHYROID HORMONE, INTACT (NO CA): PTH: 57 pg/mL (ref 15–65)

## 2018-05-06 LAB — RETICULOCYTES
RBC.: 2.49 MIL/uL — ABNORMAL LOW (ref 4.22–5.81)
RETIC CT PCT: 2.7 % (ref 0.4–3.1)
Retic Count, Absolute: 67.2 10*3/uL (ref 19.0–186.0)

## 2018-05-06 LAB — SAVE SMEAR

## 2018-05-06 MED ORDER — POTASSIUM CHLORIDE 20 MEQ PO PACK
40.0000 meq | PACK | Freq: Two times a day (BID) | ORAL | Status: DC
  Filled 2018-05-06: qty 2

## 2018-05-06 MED ORDER — POTASSIUM CHLORIDE CRYS ER 20 MEQ PO TBCR
20.0000 meq | EXTENDED_RELEASE_TABLET | Freq: Once | ORAL | Status: AC
  Administered 2018-05-06: 20 meq via ORAL
  Filled 2018-05-06: qty 1

## 2018-05-06 MED ORDER — SODIUM CHLORIDE 0.9 % IV SOLN
125.0000 mg | Freq: Every day | INTRAVENOUS | Status: DC
  Administered 2018-05-06 – 2018-05-11 (×7): 125 mg via INTRAVENOUS
  Filled 2018-05-06 (×9): qty 10

## 2018-05-06 MED ORDER — IPRATROPIUM-ALBUTEROL 0.5-2.5 (3) MG/3ML IN SOLN
3.0000 mL | Freq: Four times a day (QID) | RESPIRATORY_TRACT | Status: DC
  Administered 2018-05-06: 3 mL via RESPIRATORY_TRACT
  Filled 2018-05-06 (×2): qty 3

## 2018-05-06 MED ORDER — IPRATROPIUM-ALBUTEROL 0.5-2.5 (3) MG/3ML IN SOLN: 3.0000 mL | RESPIRATORY_TRACT | Status: DC | PRN

## 2018-05-06 MED ORDER — POTASSIUM CHLORIDE CRYS ER 20 MEQ PO TBCR
40.0000 meq | EXTENDED_RELEASE_TABLET | Freq: Two times a day (BID) | ORAL | Status: DC
  Administered 2018-05-06 – 2018-05-11 (×12): 40 meq via ORAL
  Filled 2018-05-06 (×11): qty 2
  Filled 2018-05-06: qty 4

## 2018-05-06 NOTE — Progress Notes (Addendum)
Progress Note  Patient Name: Anthony PROCHNOW Sr. Date of Encounter: 05/06/2018  Primary Cardiologist: [name], MD   Subjective   Breathing is improved although still not back to baseline.  Inpatient Medications    Scheduled Meds: . allopurinol  200 mg Oral Daily  . aspirin EC  81 mg Oral Daily  . dutasteride  0.5 mg Oral Daily  . escitalopram  5 mg Oral QHS  . furosemide  80 mg Intravenous Q8H  . ipratropium-albuterol  3 mL Nebulization Q6H  . lactulose  20 g Oral Daily  . metolazone  2.5 mg Oral Daily  . multivitamin  1 tablet Oral Daily  . pantoprazole  40 mg Oral Daily  . potassium chloride  40 mEq Oral BID  . rOPINIRole  4 mg Oral QHS  . sodium chloride flush  3 mL Intravenous Q12H  . tamsulosin  0.4 mg Oral QPC breakfast   Continuous Infusions: . sodium chloride    . ferric gluconate (FERRLECIT/NULECIT) IV     PRN Meds: sodium chloride, acetaminophen, diphenhydrAMINE-zinc acetate, ondansetron (ZOFRAN) IV, sodium chloride flush   Vital Signs    Vitals:   05/05/18 2048 05/06/18 0200 05/06/18 0328 05/06/18 0415  BP: 98/61  121/60   Pulse: 73 78 70 82  Resp:  20  (!) 22  Temp: 98.2 F (36.8 C)     TempSrc: Oral     SpO2: 94% 94%  94%  Weight:   88 kg   Height:        Intake/Output Summary (Last 24 hours) at 05/06/2018 1225 Last data filed at 05/06/2018 1115 Gross per 24 hour  Intake 240 ml  Output 2825 ml  Net -2585 ml   Filed Weights   05/04/18 0443 05/05/18 0440 05/06/18 0328  Weight: 88.1 kg 88.1 kg 88 kg    Telemetry   Ventricular pacing with underlying atrial fib- Personally Reviewed  ECG    No new tracing- Personally Reviewed  Physical Exam  Frail-appearing, multiple ecchymoses. GEN: No acute distress.   Neck: No JVD Cardiac: RRR, no murmurs, rubs, or gallops.  Respiratory: Clear to auscultation bilaterally. GI: Soft, nontender, non-distended  MS: No edema; No deformity. Neuro:  Nonfocal  Psych: Normal affect   Labs      Chemistry Recent Labs  Lab 05/04/18 0011 05/05/18 0416 05/05/18 0749 05/06/18 0554  NA 140 140  --  140  K 4.2 3.6  --  3.2*  CL 100 98  --  93*  CO2 31 34*  --  38*  GLUCOSE 109* 88  --  93  BUN 71* 74*  --  72*  CREATININE 1.95* 1.95*  --  1.61*  CALCIUM 8.8* 8.8*  --  8.9  PROT  --   --  6.4*  --   ALBUMIN  --  3.0* 2.9* 3.2*  AST  --   --  27  --   ALT  --   --  35  --   ALKPHOS  --   --  74  --   BILITOT  --   --  1.3*  --   GFRNONAA 30* 30*  --  38*  GFRAA 35* 35*  --  44*  ANIONGAP 9 8  --  9     Hematology Recent Labs  Lab 05/04/18 0011 05/05/18 0416 05/06/18 0554  WBC 3.4* 3.8* 3.8*  RBC 2.44* 2.21* 2.49*  2.49*  HGB 8.7* 8.1* 9.0*  HCT 28.4* 26.0* 28.7*  MCV  116.4* 117.6* 115.3*  MCH 35.7* 36.7* 36.1*  MCHC 30.6 31.2 31.4  RDW 17.5* 17.5* 16.9*  PLT 113* 106* 98*    Cardiac EnzymesNo results for input(s): TROPONINI in the last 168 hours.  Recent Labs  Lab 05/03/18 0412  TROPIPOC 0.03     BNP Recent Labs  Lab 05/03/18 0406  BNP 1,865.0*     DDimer No results for input(s): DDIMER in the last 168 hours.   Radiology    US Renal  Result Date: 05/05/2018 CLINICAL DATA:  Acute renal injury EXAM: RENAL / URINARY TRACT ULTRASOUND COMPLETE COMPARISON:  01/10/2018 FINDINGS: Right Kidney: Length: 12.6 cm. Multiple cysts are identified. The largest of these measures 3.9 cm in greatest dimension and stable from the prior exam. No hydronephrosis is noted. Left Kidney: Length: 13.2 cm. Multiple cysts are noted. The largest of these measures 6.8 cm in greatest dimension also stable from the previous exam. No hydronephrosis is noted. Bladder: Decompressed Small pleural effusions are noted bilaterally. Minimal ascites is seen as well. IMPRESSION: Bilateral renal cysts stable from the prior CT examination. Bilateral pleural effusions and mild ascites. Electronically Signed   By: Inez Catalina M.D.   On: 05/05/2018 06:12    Cardiac Studies   No new cardiac  data  Patient Profile     84 y.o. male chronic systolic heart failurewith an EF of 25%, nonischemic cardiomyopathy(per cath 2010), pulmonary hypertension,RLS,OSA on CPAP, severe tricuspid regurgitation, permanent atrial fibrillation with slow ventricular responsenot on anticoagulation, single-chamber pacemakerin situ withrecent upgrade to CRT-P on 03/29/2018 per Dr. [name] and hypertensionwho is being seen today for the evaluation ofacuteCHF exacerbationat the request of Dr. Lorin Mercy.  Assessment & Plan    1. Acute on chronic combined systolic heart failure/nonischemic CM: -3 L yesterday and already 1 L negative today.  Acute exacerbation is improving..  Continue aggressive diuresis. 2. Permanent atrial fibrillation: Adequate rate control. 3. Stage III/IV CKD kidney function improved today with 4 L diuresis. 4. COPD: Continue O2 therapy, but no CO2 retention. 5. Pancytopenia: We will need bone marrow by Dr. Marin Olp at some point.  Overall multiple comorbidities.  Decompensated systolic heart failure and CKD improving with diuresis.  Once the patient has had adequate diuresis, he will need optimization of medical therapy for systolic dysfunction including beta-blocker and renin angiotensin system if possible given COPD and CKD.     For questions or updates, please contact [place] Please consult [email or web address] for contact info under        Signed, Sinclair Grooms, MD  05/06/2018, 12:25 PM

## 2018-05-06 NOTE — Progress Notes (Signed)
RT placed patient back on BIPAP. Patient was complaining of some SOB. RT will continue to monitor.

## 2018-05-06 NOTE — Progress Notes (Signed)
Family is currently at the bedside talking to patient and spending time. Pt states will notify RN once ready for bed so can use NIV. Pt is stable at this time.

## 2018-05-06 NOTE — Progress Notes (Signed)
[facility] ROUNDING NOTE   Subjective:   84 year old gentleman with's chronic systolic heart failure nonischemic cardiomyopathy per cath 2010 EF 25% pulmonary hypertension obstructive sleep apnea on CPAP severe tricuspid regurgitation permanent atrial fibrillation not on anticoagulation single pacemaker in situ 03/29/2018 Dr. [name] history of hypertension he has pancytopenia and is being evaluated by Dr. Burney Gauze.  Scheduled for bone marrow biopsy on Monday, 05/08/2018  Renal ultrasound showed bilateral renal cyst with bilateral pleural effusion mild ascites 05/05/2018  Sodium 140 potassium 3.2 chloride 93 CO2 38 BUN 72 creatinine 1.6 phosphorus 3.3 albumin 3.2 WBC 3.8 hemoglobin 9.0 platelets 98    Objective:  Vital signs in last 24 hours:  Temp:  [98 F (36.7 C)-98.2 F (36.8 C)] 98.2 F (36.8 C) (09/27 2048) Pulse Rate:  [70-82] 82 (09/28 0415) Resp:  [20-22] 22 (09/28 0415) BP: (96-121)/(55-61) 121/60 (09/28 0328) SpO2:  [94 %-99 %] 94 % (09/28 0415) Weight:  [88 kg] 88 kg (09/28 0328)  Weight change: -0.1 kg Filed Weights   05/04/18 0443 05/05/18 0440 05/06/18 0328  Weight: 88.1 kg 88.1 kg 88 kg    Intake/Output: I/O last 3 completed shifts: In: 240 [P.O.:240] Out: 4100 [Urine:4100]   Intake/Output this shift:  Total I/O In: 0  Out: 1050 [Urine:1050]  CVS- RRR JVP not elevated RS- CTA bilateral crackles at bases  ABD- BS present soft non-distended EXT- no edema   Basic Metabolic Panel: Recent Labs  Lab 05/03/18 0406 05/04/18 0011 05/05/18 0416 05/06/18 0554  NA 139 140 140 140  K 4.0 4.2 3.6 3.2*  CL 96* 100 98 93*  CO2 32 31 34* 38*  GLUCOSE 97 109* 88 93  BUN 70* 71* 74* 72*  CREATININE 1.94* 1.95* 1.95* 1.61*  CALCIUM 9.0 8.8* 8.8* 8.9  PHOS  --   --  4.4 3.3    Liver Function Tests: Recent Labs  Lab 05/05/18 0416 05/05/18 0749 05/06/18 0554  AST  --  27  --   ALT  --  35  --   ALKPHOS  --  74  --   BILITOT  --  1.3*   --   PROT  --  6.4*  --   ALBUMIN 3.0* 2.9* 3.2*   No results for input(s): LIPASE, AMYLASE in the last 168 hours. Recent Labs  Lab 05/04/18 1255 05/05/18 1110  AMMONIA 42* 19    CBC: Recent Labs  Lab 05/03/18 0406 05/04/18 0011 05/05/18 0416 05/06/18 0554  WBC 3.2* 3.4* 3.8* 3.8*  NEUTROABS  --  2.3  --  2.9  HGB 8.7* 8.7* 8.1* 9.0*  HCT 27.1* 28.4* 26.0* 28.7*  MCV 114.3* 116.4* 117.6* 115.3*  PLT 118* 113* 106* 98*    Cardiac Enzymes: No results for input(s): CKTOTAL, CKMB, CKMBINDEX, TROPONINI in the last 168 hours.  BNP: Invalid input(s): POCBNP  CBG: Recent Labs  Lab 05/04/18 1207  Andrew*    Microbiology: Results for orders placed or performed during the hospital encounter of 05/03/18  MRSA PCR Screening     Status: None   Collection Time: 05/04/18  4:20 PM  Result Value Ref Range Status   MRSA by PCR NEGATIVE NEGATIVE Final    Comment:        The GeneXpert MRSA Assay (FDA approved for NASAL specimens only), is one component of a comprehensive MRSA colonization surveillance program. It is not intended to diagnose MRSA infection nor to guide or monitor treatment for MRSA infections. Performed at [facility]  Lab, [address], [place], [place] [address]     Coagulation Studies: No results for input(s): LABPROT, INR in the last 72 hours.  Urinalysis: Recent Labs    05/05/18 2247  COLORURINE YELLOW  LABSPEC 1.008  PHURINE 5.0  GLUCOSEU NEGATIVE  HGBUR NEGATIVE  BILIRUBINUR NEGATIVE  KETONESUR NEGATIVE  PROTEINUR NEGATIVE  NITRITE NEGATIVE  LEUKOCYTESUR NEGATIVE      Imaging: US Renal  Result Date: 05/05/2018 CLINICAL DATA:  Acute renal injury EXAM: RENAL / URINARY TRACT ULTRASOUND COMPLETE COMPARISON:  01/10/2018 FINDINGS: Right Kidney: Length: 12.6 cm. Multiple cysts are identified. The largest of these measures 3.9 cm in greatest dimension and stable from the prior exam. No hydronephrosis is noted. Left Kidney: Length:  13.2 cm. Multiple cysts are noted. The largest of these measures 6.8 cm in greatest dimension also stable from the previous exam. No hydronephrosis is noted. Bladder: Decompressed Small pleural effusions are noted bilaterally. Minimal ascites is seen as well. IMPRESSION: Bilateral renal cysts stable from the prior CT examination. Bilateral pleural effusions and mild ascites. Electronically Signed   By: Inez Catalina M.D.   On: 05/05/2018 06:12     Medications:   . sodium chloride     . allopurinol  200 mg Oral Daily  . aspirin EC  81 mg Oral Daily  . dutasteride  0.5 mg Oral Daily  . escitalopram  5 mg Oral QHS  . furosemide  80 mg Intravenous Q8H  . ipratropium-albuterol  3 mL Nebulization Q6H  . lactulose  20 g Oral Daily  . metolazone  2.5 mg Oral Daily  . multivitamin  1 tablet Oral Daily  . pantoprazole  40 mg Oral Daily  . rOPINIRole  4 mg Oral QHS  . sodium chloride flush  3 mL Intravenous Q12H  . tamsulosin  0.4 mg Oral QPC breakfast   sodium chloride, acetaminophen, diphenhydrAMINE-zinc acetate, ondansetron (ZOFRAN) IV, sodium chloride flush  Assessment/ Plan:   Acute on chronic kidney disease stage III (although baseline serum creatinine appears to be about 1.2 ) with decompensated heart failure hypoxia and hypercarbia.  Appears to have diuresed well creatinine is remained relatively stable.  Renal ultrasound appears to have no evidence of hydronephrosis  Hypokalemia will replete with potassium supplementation  Hypertension/volume appears to be diuresing well on combination of IV Lasix 80 mg IV every 8 hours and metolazone 2.5 mg daily we will need to watch for changes in electrolytes.  Tremendous diuresis 2 to 3 L/day  Congestive heart failure with systolic dysfunction EF 69%  BPH continues on Flomax  Restless leg syndrome takes Requip 4 mg at bedtime  Anemia low iron we will start Venofer 05/06/2018  Bones PTH 57      SPEP and UPEP pending.  Avoid use of  nonsteroidal inflammatories Cox 2 inhibitors ACE inhibitors and ARB's.  No IV contrast   LOS: 3 Sherril Croon _0 _1 :10 PM

## 2018-05-06 NOTE — Progress Notes (Signed)
PROGRESS NOTE    Bertie Simien  IRS:[id number] DOB: 17-Feb-1932 DOA: 05/03/2018 PCP: [name], MD   Brief Narrative: [name] is a 84 y.o. male with medical history significant of RLS; pulmonary HTN; pacemaker placement; afib on Eliquis; OSA on CPAP; HTN; HLD; CAD; stage 3 CKD; and CHF (EF 25-30% in 6/19) presenting with SOB. He has had symptoms for a couple of days. He has been breaking out in a rash and itching all over, as well.  The rash has been present for a couple of days. +LE edema.  Weight increased from 179-191.  No orthopnea.  He wears 2L home O2, but this AM his sats were 84% on 2L.  ED Course:  CHF exacerbation.  SOB, edema, weight gain. He had an LV lead placed and symptoms since.  He went on a cruise and got symptoms there, received Lasix.  On 2L home O2, sats in 70s today so increased O2.  Bedside ultrasound without obvious pericardial effusion.  Petechiae on LE, new x days - platelets 118.  He was started on Doxy 3 days ago for ?MRSA rash - ?related to doxy.  Assessment & Plan:   Principal Problem:   Acute on chronic respiratory failure with hypoxia (HCC) Active Problems:   OSA on CPAP   BPH (benign prostatic hyperplasia)   Chronic atrial fibrillation (HCC)   Acute renal failure superimposed on stage 3 chronic kidney disease (HCC)   Biventricular cardiac pacemaker  st Judes  dual chamber with LV in atrial port    Acute on chronic systolic heart failure (HCC)   Petechial rash  Acute on chronic hypoxic and hypercapnic respiratory failure, secondary to CHF exacerbation.  COPD on 3 L oxygen  Presents with hypoxemia, BNP 1865. AKI.  Patient notice to be more lethargic, confuse on 9-26. Blood gas showed PH 7.2 PCO2 74, po2 ; 78 Patient was  transfer to step down unit, CCM consulted. Patient placed on BIPAP. Improved. He is more alert, less confuse.  Continue with IV lasix and metolazone.   He is 5 L, sat 100 %, I have decrease oxygen to 4 L. Will  need to continue to taper oxygen down as possible to home use of 3 L.  Start nebulizer, feels tight   Acute on chronic systolic heart failure exacerbation;  Presents with hypoxemia, BNP elevated, chest x ray; cardiomegaly, mild vascular congestion , interstitial edema.  Continue with IV lasix  , metolazone.  Appreciate cardiology and nephrology  assistance.  Continue with IV lasix and metolazone. Cr trending down.   Acute on chronic renal failure stage II  Suspect related to heart failure.  Continue with IV lasix.  Nephrology consulted per wife request.  Urine out put 3.5 l yesterday, cr trending down.  He was retaining urine this am, subsequently he was able to urinate 1200. Asked staff to monitor urine out put and perform bladder scan.  Protein electrophoresis ordered elevated light and kappa . Oncology following.    Acute metabolic encephalopathy;  Notice to be more lethargic and confuse 9-26.  ABG PCO at 74, Ph 7.2.  Started BIPAP. Improved on BIPAP> Discontinue schedule tramadol.  Hold Remeron.  Ammonia mildly elevated at 42. Started on lactulose. Ammonia normalized.  Improved. Alert and oriented.   A fib; not on anticoagulation due to GI bleed history. Biventricular pacer placement  Pancytopenia;  3 months ago he had low WBC, and low hb. Platelet count also mildly low.  B12 level;  861. Peripheral smear ordered. L[facility] 313 Dr Marin Olp consulted. Plan for bone marrow biopsy on Monday.  erythropoietin ordered.  Stable.   Rash; -Petechial rash  Patient was recently started on Doxy for rash - unclear if that was for current petechial rash or if the current rash developed subsequent to starting doxy Holding doxy.  Monitor.   BPH; Continue Avodart and Flomax  OSA on CPAP Need to make sure patient use BIPAP at night. Discussed with nursing staff,   Hypokalemia replaced.   DVT prophylaxis; hold Lovenox, platelet continue to decreased. Start SCD.  Code Status: full code.    Family Communication: wife at bedside 9-26. Care discussed with patient 9-28 Disposition Plan: remain step down, IV lasix, BIPAP PRN/   Consultants:   Cardiology  Pulmonology  Nephrology    Procedures:   Renal US   Antimicrobials:   none   Subjective: He is alert, oriented to place and person. He remember conversation with Dr Marin Olp.  He is breathing better, but not at baseline. Needs nasal spray   Objective: Vitals:   05/05/18 2048 05/06/18 0200 05/06/18 0328 05/06/18 0415  BP: 98/61  121/60   Pulse: 73 78 70 82  Resp:  20  (!) 22  Temp: 98.2 F (36.8 C)     TempSrc: Oral     SpO2: 94% 94%  94%  Weight:   88 kg   Height:        Intake/Output Summary (Last 24 hours) at 05/06/2018 0851 Last data filed at 05/06/2018 0840 Gross per 24 hour  Intake 240 ml  Output 2700 ml  Net -2460 ml   Filed Weights   05/04/18 0443 05/05/18 0440 05/06/18 0328  Weight: 88.1 kg 88.1 kg 88 kg    Examination:  General exam: Alert, in no distress.  Respiratory system: bilateral crackles.  Cardiovascular system: S 1, S 2 RRR Gastrointestinal system; Soft, NT, ND Central nervous system: alert, no distress, follows command.  Extremities: petechia rash     Data Reviewed: I have personally reviewed following labs and imaging studies  CBC: Recent Labs  Lab 05/03/18 0406 05/04/18 0011 05/05/18 0416 05/06/18 0554  WBC 3.2* 3.4* 3.8* 3.8*  NEUTROABS  --  2.3  --  2.9  HGB 8.7* 8.7* 8.1* 9.0*  HCT 27.1* 28.4* 26.0* 28.7*  MCV 114.3* 116.4* 117.6* 115.3*  PLT 118* 113* 106* 98*   Basic Metabolic Panel: Recent Labs  Lab 05/03/18 0406 05/04/18 0011 05/05/18 0416 05/06/18 0554  NA 139 140 140 140  K 4.0 4.2 3.6 3.2*  CL 96* 100 98 93*  CO2 32 31 34* 38*  GLUCOSE 97 109* 88 93  BUN 70* 71* 74* 72*  CREATININE 1.94* 1.95* 1.95* 1.61*  CALCIUM 9.0 8.8* 8.8* 8.9  PHOS  --   --  4.4 3.3   GFR: Estimated Creatinine Clearance: 36.2 mL/min (A) (by C-G formula based  on SCr of 1.61 mg/dL (H)). Liver Function Tests: Recent Labs  Lab 05/05/18 0416 05/05/18 0749 05/06/18 0554  AST  --  27  --   ALT  --  35  --   ALKPHOS  --  74  --   BILITOT  --  1.3*  --   PROT  --  6.4*  --   ALBUMIN 3.0* 2.9* 3.2*   No results for input(s): LIPASE, AMYLASE in the last 168 hours. Recent Labs  Lab 05/04/18 1255 05/05/18 1110  AMMONIA 42* 19   Coagulation Profile: No results for input(s):  INR, PROTIME in the last 168 hours. Cardiac Enzymes: No results for input(s): CKTOTAL, CKMB, CKMBINDEX, TROPONINI in the last 168 hours. BNP (last 3 results) No results for input(s): PROBNP in the last 8760 hours. HbA1C: No results for input(s): HGBA1C in the last 72 hours. CBG: Recent Labs  Lab 05/04/18 1207  GLUCAP 139*   Lipid Profile: No results for input(s): CHOL, HDL, LDLCALC, TRIG, CHOLHDL, LDLDIRECT in the last 72 hours. Thyroid Function Tests: No results for input(s): TSH, T4TOTAL, FREET4, T3FREE, THYROIDAB in the last 72 hours. Anemia Panel: Recent Labs    05/04/18 1255 05/04/18 1655 05/05/18 1221 05/06/18 0554  VITAMINB12 861  --   --   --   FERRITIN  --  36  --   --   TIBC  --  377 378  --   IRON  --  39* 45  --   RETICCTPCT  --   --   --  2.7   Sepsis Labs: No results for input(s): PROCALCITON, LATICACIDVEN in the last 168 hours.  Recent Results (from the past 240 hour(s))  MRSA PCR Screening     Status: None   Collection Time: 05/04/18  4:20 PM  Result Value Ref Range Status   MRSA by PCR NEGATIVE NEGATIVE Final    Comment:        The GeneXpert MRSA Assay (FDA approved for NASAL specimens only), is one component of a comprehensive MRSA colonization surveillance program. It is not intended to diagnose MRSA infection nor to guide or monitor treatment for MRSA infections. Performed at [facility], [place] [address] [place], [place] [address]          Radiology Studies: US Renal  Result Date: 05/05/2018 CLINICAL DATA:   Acute renal injury EXAM: RENAL / URINARY TRACT ULTRASOUND COMPLETE COMPARISON:  01/10/2018 FINDINGS: Right Kidney: Length: 12.6 cm. Multiple cysts are identified. The largest of these measures 3.9 cm in greatest dimension and stable from the prior exam. No hydronephrosis is noted. Left Kidney: Length: 13.2 cm. Multiple cysts are noted. The largest of these measures 6.8 cm in greatest dimension also stable from the previous exam. No hydronephrosis is noted. Bladder: Decompressed Small pleural effusions are noted bilaterally. Minimal ascites is seen as well. IMPRESSION: Bilateral renal cysts stable from the prior CT examination. Bilateral pleural effusions and mild ascites. Electronically Signed   By: Inez Catalina M.D.   On: 05/05/2018 06:12        Scheduled Meds: . allopurinol  200 mg Oral Daily  . aspirin EC  81 mg Oral Daily  . dutasteride  0.5 mg Oral Daily  . enoxaparin (LOVENOX) injection  30 mg Subcutaneous Q24H  . escitalopram  5 mg Oral QHS  . furosemide  80 mg Intravenous Q8H  . ipratropium-albuterol  3 mL Nebulization Q6H  . lactulose  20 g Oral Daily  . metolazone  2.5 mg Oral Daily  . multivitamin  1 tablet Oral Daily  . pantoprazole  40 mg Oral Daily  . potassium chloride  20 mEq Oral Once  . rOPINIRole  4 mg Oral QHS  . sodium chloride flush  3 mL Intravenous Q12H  . tamsulosin  0.4 mg Oral QPC breakfast   Continuous Infusions: . sodium chloride       LOS: 3 days    Time spent: 35 minutes.     Elmarie Shiley, MD [facility] Pager [phone]  If 7PM-7AM, please contact night-coverage [email or web address] Password [facility] 05/06/2018, 8:51 AM

## 2018-05-07 LAB — CBC WITH DIFFERENTIAL/PLATELET
Basophils Absolute: 0 10*3/uL (ref 0.0–0.1)
Basophils Relative: 1 %
EOS PCT: 3 %
Eosinophils Absolute: 0.1 10*3/uL (ref 0.0–0.7)
HEMATOCRIT: 25.8 % — AB (ref 39.0–52.0)
Hemoglobin: 8.3 g/dL — ABNORMAL LOW (ref 13.0–17.0)
Lymphocytes Relative: 23 %
Lymphs Abs: 0.6 10*3/uL — ABNORMAL LOW (ref 0.7–4.0)
MCH: 36.6 pg — ABNORMAL HIGH (ref 26.0–34.0)
MCHC: 32.2 g/dL (ref 30.0–36.0)
MCV: 113.7 fL — AB (ref 78.0–100.0)
MONOS PCT: 12 %
Monocytes Absolute: 0.3 10*3/uL (ref 0.1–1.0)
NEUTROS PCT: 61 %
Neutro Abs: 1.6 10*3/uL — ABNORMAL LOW (ref 1.7–7.7)
PLATELETS: 91 10*3/uL — AB (ref 150–400)
RBC: 2.27 MIL/uL — AB (ref 4.22–5.81)
RDW: 17.1 % — ABNORMAL HIGH (ref 11.5–15.5)
WBC: 2.6 10*3/uL — AB (ref 4.0–10.5)

## 2018-05-07 LAB — RENAL FUNCTION PANEL
Albumin: 3 g/dL — ABNORMAL LOW (ref 3.5–5.0)
Anion gap: 6 (ref 5–15)
BUN: 69 mg/dL — ABNORMAL HIGH (ref 8–23)
CHLORIDE: 93 mmol/L — AB (ref 98–111)
CO2: 43 mmol/L — ABNORMAL HIGH (ref 22–32)
Calcium: 8.9 mg/dL (ref 8.9–10.3)
Creatinine, Ser: 1.47 mg/dL — ABNORMAL HIGH (ref 0.61–1.24)
GFR, EST AFRICAN AMERICAN: 49 mL/min — AB (ref >60)
GFR, EST NON AFRICAN AMERICAN: 42 mL/min — AB (ref >60)
Glucose, Bld: 94 mg/dL (ref 70–99)
POTASSIUM: 3.6 mmol/L (ref 3.5–5.1)
Phosphorus: 2.8 mg/dL (ref 2.5–4.6)
Sodium: 142 mmol/L (ref 135–145)

## 2018-05-07 LAB — ERYTHROPOIETIN: Erythropoietin: 130.4 m[IU]/mL — ABNORMAL HIGH (ref 2.6–18.5)

## 2018-05-07 MED ORDER — IPRATROPIUM-ALBUTEROL 0.5-2.5 (3) MG/3ML IN SOLN
3.0000 mL | Freq: Three times a day (TID) | RESPIRATORY_TRACT | Status: DC
  Administered 2018-05-07 – 2018-05-10 (×9): 3 mL via RESPIRATORY_TRACT
  Filled 2018-05-07 (×10): qty 3

## 2018-05-07 MED ORDER — HYDROXYZINE HCL 10 MG PO TABS
10.0000 mg | ORAL_TABLET | Freq: Three times a day (TID) | ORAL | Status: DC | PRN
  Administered 2018-05-07 – 2018-05-08 (×2): 10 mg via ORAL
  Filled 2018-05-07 (×2): qty 1

## 2018-05-07 MED ORDER — FUROSEMIDE 80 MG PO TABS
80.0000 mg | ORAL_TABLET | Freq: Two times a day (BID) | ORAL | Status: DC
  Administered 2018-05-07 – 2018-05-11 (×9): 80 mg via ORAL
  Filled 2018-05-07 (×9): qty 1

## 2018-05-07 NOTE — Progress Notes (Signed)
Progress Note  Patient Name: Anthony KAZLAUSKAS Sr. Date of Encounter: 05/07/2018  Primary Cardiologist: [name], MD   Subjective   When asked how he is doing he replied "you tell me".  He is not complaining of any specific problems.  He did sleep last night he was able to lie in the bed.  Inpatient Medications    Scheduled Meds: . allopurinol  200 mg Oral Daily  . aspirin EC  81 mg Oral Daily  . dutasteride  0.5 mg Oral Daily  . escitalopram  5 mg Oral QHS  . furosemide  80 mg Oral BID  . ipratropium-albuterol  3 mL Nebulization TID  . lactulose  20 g Oral Daily  . metolazone  2.5 mg Oral Daily  . multivitamin  1 tablet Oral Daily  . pantoprazole  40 mg Oral Daily  . potassium chloride  40 mEq Oral BID  . rOPINIRole  4 mg Oral QHS  . sodium chloride flush  3 mL Intravenous Q12H  . tamsulosin  0.4 mg Oral QPC breakfast   Continuous Infusions: . sodium chloride    . ferric gluconate (FERRLECIT/NULECIT) IV 125 mg (05/07/18 1026)   PRN Meds: sodium chloride, acetaminophen, diphenhydrAMINE-zinc acetate, hydrOXYzine, ipratropium-albuterol, ondansetron (ZOFRAN) IV, sodium chloride flush   Vital Signs    Vitals:   05/07/18 0647 05/07/18 0753 05/07/18 0803 05/07/18 1100  BP:   107/63 (!) 95/45  Pulse: 76 70 70 69  Resp: 20 20  15   Temp:   98 F (36.7 C) 98.1 F (36.7 C)  TempSrc:   Oral Oral  SpO2: 96% 94% 97% 91%  Weight:      Height:        Intake/Output Summary (Last 24 hours) at 05/07/2018 1210 Last data filed at 05/07/2018 1010 Gross per 24 hour  Intake 332 ml  Output 2595 ml  Net -2263 ml   Filed Weights   05/04/18 0443 05/05/18 0440 05/06/18 0328  Weight: 88.1 kg 88.1 kg 88 kg    Telemetry   A. fib with paced ventricular rhythm - personally Reviewed  ECG    No new data mostly recently performed 05/04/2018 revealing A. fib and V paced rhythm - Personally Reviewed  Physical Exam  Older than stated age in appearance.  Frail with arm ecchymoses  noted. GEN: No acute distress.   Neck: Unable to visualize neck veins Cardiac: RRR, no murmurs, rubs, or gallops.  Respiratory:  Decreased breath sounds at both bases GI: Soft, nontender, non-distended  MS:  No peripheral edema. Neuro:  Nonfocal  Psych: Normal affect   Labs    Chemistry Recent Labs  Lab 05/05/18 0416 05/05/18 0749 05/06/18 0554 05/07/18 0400  NA 140  --  140 142  K 3.6  --  3.2* 3.6  CL 98  --  93* 93*  CO2 34*  --  38* 43*  GLUCOSE 88  --  93 94  BUN 74*  --  72* 69*  CREATININE 1.95*  --  1.61* 1.47*  CALCIUM 8.8*  --  8.9 8.9  PROT  --  6.4*  --   --   ALBUMIN 3.0* 2.9* 3.2* 3.0*  AST  --  27  --   --   ALT  --  35  --   --   ALKPHOS  --  74  --   --   BILITOT  --  1.3*  --   --   GFRNONAA 30*  --  38*  [place] 8  --  9 6     Hematology Recent Labs  Lab 05/05/18 0416 05/06/18 0554 05/07/18 0400  WBC 3.8* 3.8* 2.6*  RBC 2.21* 2.49*  2.49* 2.27*  HGB 8.1* 9.0* 8.3*  HCT 26.0* 28.7* 25.8*  MCV 117.6* 115.3* 113.7*  MCH 36.7* 36.1* 36.6*  MCHC 31.2 31.4 32.2  RDW 17.5* 16.9* 17.1*  PLT 106* 98* 91*    Cardiac EnzymesNo results for input(s): TROPONINI in the last 168 hours.  Recent Labs  Lab 05/03/18 0412  TROPIPOC 0.03     BNP Recent Labs  Lab 05/03/18 0406  BNP 1,865.0*     DDimer No results for input(s): DDIMER in the last 168 hours.   Radiology    No results found.  Cardiac Studies   No new cardiac data  Patient Profile     84 y.o. male chronic systolic heart failurewith an EF of 25%, nonischemic cardiomyopathy(per cath 2010), pulmonary hypertension,RLS,OSA on CPAP, severe tricuspid regurgitation, permanent atrial fibrillation with slow ventricular responsenot on anticoagulation, single-chamber pacemakerin situ withrecent upgrade to CRT-P on 03/29/2018 per Dr. [name] and hypertensionwho is being seen today for the evaluation ofacuteCHF exacerbationat the request of Dr.  Lorin Mercy.  Assessment & Plan    1. Acute on chronic combined systolic heart failure/nonischemic CM: -Weights appear to be inaccurate.  If INO parameters are correct he is -7 L since admission.  Relatively low blood pressure will limit diuresis. 2. Permanent atrial fibrillation: Paced rhythm 3. Stage III/IV CKD: Kidney function remains stable 4. COPD: On supplemental oxygen therapy 5. Pancytopenia: Etiology requires further investigation.  Overall multiple comorbidities.  Overall prognosis seems poor..  Continue to follow I/O on current diuretic regimen.  No further recommendations.     For questions or updates, please contact [place] Please consult [email or web address] for contact info under        Signed, Sinclair Grooms, MD  05/07/2018, 12:10 PM

## 2018-05-07 NOTE — Progress Notes (Signed)
PROGRESS NOTE    [name]  FOY:[id number] DOB: 1931-10-11 DOA: 05/03/2018 PCP: [name], MD   Brief Narrative: Anthony Agar Sr. is a 84 y.o. male with medical history significant of RLS; pulmonary HTN; pacemaker placement; afib on Eliquis; OSA on CPAP; HTN; HLD; CAD; stage 3 CKD; and CHF (EF 25-30% in 6/19) presenting with SOB. He has had symptoms for a couple of days. He has been breaking out in a rash and itching all over, as well.  The rash has been present for a couple of days. +LE edema.  Weight increased from 179-191.  No orthopnea.  He wears 2L home O2, but this AM his sats were 84% on 2L.  ED Course:  CHF exacerbation.  SOB, edema, weight gain. He had an LV lead placed and symptoms since.  He went on a cruise and got symptoms there, received Lasix.  On 2L home O2, sats in 70s today so increased O2.  Bedside ultrasound without obvious pericardial effusion.  Petechiae on LE, new x days - platelets 118.  He was started on Doxy 3 days ago for ?MRSA rash - ?related to doxy.  Assessment & Plan:   Principal Problem:   Acute on chronic respiratory failure with hypoxia (HCC) Active Problems:   OSA on CPAP   BPH (benign prostatic hyperplasia)   Chronic atrial fibrillation (HCC)   Acute renal failure superimposed on stage 3 chronic kidney disease (HCC)   Biventricular cardiac pacemaker  st Judes  dual chamber with LV in atrial port    Acute on chronic systolic heart failure (HCC)   Petechial rash   AKI (acute kidney injury) ([place])  Acute on chronic hypoxic and hypercapnic respiratory failure, secondary to CHF exacerbation.  COPD on 3 L oxygen  Presents with hypoxemia, BNP 1865. AKI.  Patient notice to be more lethargic, confuse on 9-26. Blood gas showed PH 7.2 PCO2 74, po2 ; 78 Patient was  transfer to step down unit, CCM consulted. Improved on BIPAP. Needs to use BIPAP at HS.  Continue with IV lasix and metolazone.   On 4 L oxygen. Continue to complaint of SOB.  Continue with nebulizer.   Acute on chronic systolic heart failure exacerbation;  Presents with hypoxemia, BNP elevated, chest x ray; cardiomegaly, mild vascular congestion , interstitial edema.  Continue with IV lasix  , metolazone.  Appreciate cardiology and nephrology  assistance.  Continue with IV lasix and metolazone. Cr trending down.   Acute on chronic renal failure stage II  Suspect related to heart failure. Cr baseline 1.2.  Continue with IV lasix.  Nephrology consulted per wife request.  Urine out put 3.2  l yesterday, cr trending down.  Protein electrophoresis ordered elevated light and kappa . Oncology following.  Cr today 1.4---peak to 1.9 this admission.   Acute metabolic encephalopathy;  Notice to be more lethargic and confuse 9-26.  ABG PCO at 74, Ph 7.2.  Started BIPAP. Improved on BIPAP> Discontinue schedule tramadol.  Hold Remeron.  Ammonia mildly elevated at 42. Started on lactulose. Ammonia normalized.  Resolved.   A fib; not on anticoagulation due to GI bleed history. Biventricular pacer placement  Pancytopenia;  3 months ago he had low WBC, and low hb. Platelet count also mildly low.  B12 level; 861. Peripheral smear ordered. LDH 313 Dr Marin Olp consulted. Plan for bone marrow biopsy on Monday.  erythropoietin ordered.  Count continue to drop. Repeat hb in am to determine need for blood transfusion. [name]  Rash; -Petechial rash  Patient was recently started on Doxy for rash - unclear if that was for current petechial rash or if the current rash developed subsequent to starting doxy Holding doxy.  Monitor.   BPH; Continue Avodart and Flomax  OSA on CPAP Need to make sure patient use BIPAP at night. Discussed with nursing staff,   Hypokalemia resolved. On oral supplement. Monitor.   DVT prophylaxis; hold Lovenox, platelet continue to decreased. Start SCD.  Code Status: full code.  Family Communication: wife at bedside 9-26. Care discussed with patient  9-28 Disposition Plan: remain step down, IV lasix, BIPAP PRN/   Consultants:   Cardiology  Pulmonology  Nephrology    Procedures:   Renal US   Antimicrobials:   none   Subjective: He is still SOB, not at baseline. Complaining of itching.  He was grateful that I give him update regarding his kidney function and plan for bone marrow.    Objective: Vitals:   05/07/18 0305 05/07/18 0647 05/07/18 0753 05/07/18 0803  BP:    107/63  Pulse:  76 70 70  Resp:  20 20   Temp:    98 F (36.7 C)  TempSrc:    Oral  SpO2: 93% 96% 94% 97%  Weight:      Height:        Intake/Output Summary (Last 24 hours) at 05/07/2018 0806 Last data filed at 05/07/2018 0600 Gross per 24 hour  Intake 332 ml  Output 3245 ml  Net -2913 ml   Filed Weights   05/04/18 0443 05/05/18 0440 05/06/18 0328  Weight: 88.1 kg 88.1 kg 88 kg    Examination:  General exam; Alert, no acute distress.  Respiratory system: bilateral air movement, crackles.  Cardiovascular system: S 1, S 2  Gastrointestinal system; Soft, nt, nd Central nervous system: alert, non focal.  Extremities: petechia rash     Data Reviewed: I have personally reviewed following labs and imaging studies  CBC: Recent Labs  Lab 05/03/18 0406 05/04/18 0011 05/05/18 0416 05/06/18 0554 05/07/18 0400  WBC 3.2* 3.4* 3.8* 3.8* 2.6*  NEUTROABS  --  2.3  --  2.9 1.6*  HGB 8.7* 8.7* 8.1* 9.0* 8.3*  HCT 27.1* 28.4* 26.0* 28.7* 25.8*  MCV 114.3* 116.4* 117.6* 115.3* 113.7*  PLT 118* 113* 106* 98* 91*   Basic Metabolic Panel: Recent Labs  Lab 05/03/18 0406 05/04/18 0011 05/05/18 0416 05/06/18 0554 05/07/18 0400  NA 139 140 140 140 142  K 4.0 4.2 3.6 3.2* 3.6  CL 96* 100 98 93* 93*  CO2 32 31 34* 38* 43*  GLUCOSE 97 109* 88 93 94  BUN 70* 71* 74* 72* 69*  CREATININE 1.94* 1.95* 1.95* 1.61* 1.47*  CALCIUM 9.0 8.8* 8.8* 8.9 8.9  PHOS  --   --  4.4 3.3 2.8   GFR: Estimated Creatinine Clearance: 39.6 mL/min (A) (by C-G  formula based on SCr of 1.47 mg/dL (H)). Liver Function Tests: Recent Labs  Lab 05/05/18 0416 05/05/18 0749 05/06/18 0554 05/07/18 0400  AST  --  27  --   --   ALT  --  35  --   --   ALKPHOS  --  74  --   --   BILITOT  --  1.3*  --   --   PROT  --  6.4*  --   --   ALBUMIN 3.0* 2.9* 3.2* 3.0*   No results for input(s): LIPASE, AMYLASE in the last 168 hours. Recent Labs  Lab 05/04/18 1255 05/05/18 1110  AMMONIA 42* 19   Coagulation Profile: No results for input(s): INR, PROTIME in the last 168 hours. Cardiac Enzymes: No results for input(s): CKTOTAL, CKMB, CKMBINDEX, TROPONINI in the last 168 hours. BNP (last 3 results) No results for input(s): PROBNP in the last 8760 hours. HbA1C: No results for input(s): HGBA1C in the last 72 hours. CBG: Recent Labs  Lab 05/04/18 1207  GLUCAP 139*   Lipid Profile: No results for input(s): CHOL, HDL, LDLCALC, TRIG, CHOLHDL, LDLDIRECT in the last 72 hours. Thyroid Function Tests: No results for input(s): TSH, T4TOTAL, FREET4, T3FREE, THYROIDAB in the last 72 hours. Anemia Panel: Recent Labs    05/04/18 1255 05/04/18 1655 05/05/18 1221 05/06/18 0554  VITAMINB12 861  --   --   --   FERRITIN  --  36  --   --   TIBC  --  377 378  --   IRON  --  39* 45  --   RETICCTPCT  --   --   --  2.7   Sepsis Labs: No results for input(s): PROCALCITON, LATICACIDVEN in the last 168 hours.  Recent Results (from the past 240 hour(s))  MRSA PCR Screening     Status: None   Collection Time: 05/04/18  4:20 PM  Result Value Ref Range Status   MRSA by PCR NEGATIVE NEGATIVE Final    Comment:        The GeneXpert MRSA Assay (FDA approved for NASAL specimens only), is one component of a comprehensive MRSA colonization surveillance program. It is not intended to diagnose MRSA infection nor to guide or monitor treatment for MRSA infections. Performed at [facility], [place] [address] [place], [place] [address]          Radiology  Studies: No results found.      Scheduled Meds: . allopurinol  200 mg Oral Daily  . aspirin EC  81 mg Oral Daily  . dutasteride  0.5 mg Oral Daily  . escitalopram  5 mg Oral QHS  . furosemide  80 mg Intravenous Q8H  . ipratropium-albuterol  3 mL Nebulization TID  . lactulose  20 g Oral Daily  . metolazone  2.5 mg Oral Daily  . multivitamin  1 tablet Oral Daily  . pantoprazole  40 mg Oral Daily  . potassium chloride  40 mEq Oral BID  . rOPINIRole  4 mg Oral QHS  . sodium chloride flush  3 mL Intravenous Q12H  . tamsulosin  0.4 mg Oral QPC breakfast   Continuous Infusions: . sodium chloride    . ferric gluconate (FERRLECIT/NULECIT) IV 125 mg (05/06/18 1336)     LOS: 4 days    Time spent: 35 minutes.     Elmarie Shiley, MD [facility] Pager [phone]  If 7PM-7AM, please contact night-coverage [email or web address] Password [email or web address] 05/07/2018, 8:06 AM

## 2018-05-07 NOTE — Progress Notes (Signed)
Pt placed on BIPAP for the night- tolerating well.  Family at bedside.  No issues noted at time of placement.  Oxygen at bedside for patient use.

## 2018-05-07 NOTE — Progress Notes (Signed)
Patient is on NIV at this time, tolerating it well. No distress or complications noted. Family at bedside.

## 2018-05-07 NOTE — Progress Notes (Signed)
[facility] ROUNDING NOTE   Subjective:   84 year old gentleman with's chronic systolic heart failure nonischemic cardiomyopathy per cath 2010 EF 25% pulmonary hypertension obstructive sleep apnea on CPAP severe tricuspid regurgitation permanent atrial fibrillation not on anticoagulation single pacemaker in situ 03/29/2018 Dr. [name] history of hypertension he has pancytopenia and is being evaluated by Dr. Burney Gauze.  Scheduled for bone marrow biopsy on Monday, 05/08/2018  Renal ultrasound showed bilateral renal cyst with bilateral pleural effusion mild ascites 05/05/2018  Patient appears to be well this morning he is eating breakfast has no complaints and hoping to go home soon  Blood pressure 107/63 pulse of 70 temperature 98 O2 sats 97% 4 L nasal cannula Sodium 142 potassium 3.6 chloride 93 CO2 43 BUN 69 creatinine 1.47 calcium 8.9 phosphorus 2.8 albumin 3.0   WBC 2.6 hemoglobin 8.3 platelets 91   Objective:  Vital signs in last 24 hours:  Temp:  [97.5 F (36.4 C)-98.2 F (36.8 C)] 98 F (36.7 C) (09/29 0803) Pulse Rate:  [70-82] 70 (09/29 0803) Resp:  [14-20] 20 (09/29 0753) BP: (96-121)/(52-69) 107/63 (09/29 0803) SpO2:  [9 %-100 %] 97 % (09/29 0803) FiO2 (%):  [30 %] 30 % (09/29 0305)  Weight change:  Filed Weights   05/04/18 0443 05/05/18 0440 05/06/18 0328  Weight: 88.1 kg 88.1 kg 88 kg    Intake/Output: I/O last 3 completed shifts: In: 332 [P.O.:222; IV Piggyback:110] Out: 5701 [Urine:4920]   Intake/Output this shift:  Total I/O In: 0  Out: 400 [Urine:400]  CVS- RRR JVP not elevated RS- CTA bilateral crackles at bases  ABD- BS present soft non-distended EXT- no edema   Basic Metabolic Panel: Recent Labs  Lab 05/03/18 0406 05/04/18 0011 05/05/18 0416 05/06/18 0554 05/07/18 0400  NA 139 140 140 140 142  K 4.0 4.2 3.6 3.2* 3.6  CL 96* 100 98 93* 93*  CO2 32 31 34* 38* 43*  GLUCOSE 97 109* 88 93 94  BUN 70* 71* 74* 72* 69*  CREATININE  1.94* 1.95* 1.95* 1.61* 1.47*  CALCIUM 9.0 8.8* 8.8* 8.9 8.9  PHOS  --   --  4.4 3.3 2.8    Liver Function Tests: Recent Labs  Lab 05/05/18 0416 05/05/18 0749 05/06/18 0554 05/07/18 0400  AST  --  27  --   --   ALT  --  35  --   --   ALKPHOS  --  74  --   --   BILITOT  --  1.3*  --   --   PROT  --  6.4*  --   --   ALBUMIN 3.0* 2.9* 3.2* 3.0*   No results for input(s): LIPASE, AMYLASE in the last 168 hours. Recent Labs  Lab 05/04/18 1255 05/05/18 1110  AMMONIA 42* 19    CBC: Recent Labs  Lab 05/03/18 0406 05/04/18 0011 05/05/18 0416 05/06/18 0554 05/07/18 0400  WBC 3.2* 3.4* 3.8* 3.8* 2.6*  NEUTROABS  --  2.3  --  2.9 1.6*  HGB 8.7* 8.7* 8.1* 9.0* 8.3*  HCT 27.1* 28.4* 26.0* 28.7* 25.8*  MCV 114.3* 116.4* 117.6* 115.3* 113.7*  PLT 118* 113* 106* 98* 91*    Cardiac Enzymes: No results for input(s): CKTOTAL, CKMB, CKMBINDEX, TROPONINI in the last 168 hours.  BNP: Invalid input(s): POCBNP  CBG: Recent Labs  Lab 05/04/18 1207  Vincent*    Microbiology: Results for orders placed or performed during the hospital encounter of 05/03/18  MRSA PCR Screening  Status: None   Collection Time: 05/04/18  4:20 PM  Result Value Ref Range Status   MRSA by PCR NEGATIVE NEGATIVE Final    Comment:        The GeneXpert MRSA Assay (FDA approved for NASAL specimens only), is one component of a comprehensive MRSA colonization surveillance program. It is not intended to diagnose MRSA infection nor to guide or monitor treatment for MRSA infections. Performed at [facility], [place] [address], [place],  [address]     Coagulation Studies: No results for input(s): LABPROT, INR in the last 72 hours.  Urinalysis: Recent Labs    05/05/18 2247  COLORURINE YELLOW  LABSPEC 1.008  PHURINE 5.0  GLUCOSEU NEGATIVE  HGBUR NEGATIVE  BILIRUBINUR NEGATIVE  KETONESUR NEGATIVE  PROTEINUR NEGATIVE  NITRITE NEGATIVE  LEUKOCYTESUR NEGATIVE       Imaging: No results found.   Medications:   . sodium chloride    . ferric gluconate (FERRLECIT/NULECIT) IV 125 mg (05/07/18 1026)   . allopurinol  200 mg Oral Daily  . aspirin EC  81 mg Oral Daily  . dutasteride  0.5 mg Oral Daily  . escitalopram  5 mg Oral QHS  . furosemide  80 mg Intravenous Q8H  . ipratropium-albuterol  3 mL Nebulization TID  . lactulose  20 g Oral Daily  . metolazone  2.5 mg Oral Daily  . multivitamin  1 tablet Oral Daily  . pantoprazole  40 mg Oral Daily  . potassium chloride  40 mEq Oral BID  . rOPINIRole  4 mg Oral QHS  . sodium chloride flush  3 mL Intravenous Q12H  . tamsulosin  0.4 mg Oral QPC breakfast   sodium chloride, acetaminophen, diphenhydrAMINE-zinc acetate, hydrOXYzine, ipratropium-albuterol, ondansetron (ZOFRAN) IV, sodium chloride flush  Assessment/ Plan:   Acute on chronic kidney disease stage III (although baseline serum creatinine appears to be about 1.2 ) with decompensated heart failure hypoxia and hypercarbia.  Appears to have diuresed well creatinine is remained relatively stable.  Renal ultrasound appears to have no evidence of hydronephrosis  Hypokalemia will replete with potassium supplementation  Hypertension/volume appears to be diuresing well on combination of IV Lasix 80 mg IV every 8 hours and metolazone 2.5 mg daily we will need to watch for changes in electrolytes.  Tremendous diuresis 4 L.  Will transition patient to oral Lasix 80 mg twice daily which was his home dose  Congestive heart failure with systolic dysfunction EF 96%  BPH continues on Flomax  Restless leg syndrome takes Requip 4 mg at bedtime  Anemia low iron we will start Venofer 05/06/2018  Bones PTH 57     electrophoresis patents pending  lambda light chains ratio 1.39 she is in the normal range  Avoid use of nonsteroidal inflammatories Cox 2 inhibitors ACE inhibitors and ARB's.  No IV contrast  Will sign off from patient today please do not  hesitate to reconsult as needed.  She can follow-up at [facility] within the next 2 to 4 weeks   LOS: [place] _0 _1 :15 AM

## 2018-05-07 NOTE — Consult Note (Signed)
Chief Complaint: Patient was seen in consultation today for pancytopenia  Referring Physician(s): Dr. Marin Olp  Supervising Physician: Marybelle Killings  Patient Status: [facility] - In-pt  History of Present Illness: Anthony PERROTT Sr. is a 84 y.o. male with past medical history of CHF, CKD, GERD, HTN, a fib who was admitted with weakness and shortness of breath.  Found to have pancytopenia. Hematology/Oncology consulted; Dr. Marin Olp notes concern for possible MDS.  Request made for bone marrow biopsy.  Past Medical History:  Diagnosis Date  . Arthritis    "feet" (09/30/2017)  . CHF (congestive heart failure) ([place])   . Chronic bronchitis ([place])   . CKD (chronic kidney disease), stage III ([place])    [name] 09/30/2017  . Coronary artery disease   . Diverticulitis   . Dyspnea   . GERD (gastroesophageal reflux disease)   . Gout   . History of blood transfusion    "blood loss" (09/30/2017)  . Hyperlipidemia   . Hypertension   . MI (myocardial infarction) ([place]) ~ 2000   "light one"  . Nonischemic cardiomyopathy (HCC)    mild  . OSA on CPAP   . Pacemaker single chamber, [place], 2013 10/13/2011  . Permanent atrial fibrillation (HCC)    on Eliquis  . Presence of permanent cardiac pacemaker   . Pulmonary hypertension ([place])   . Restless legs     Past Surgical History:  Procedure Laterality Date  . APPENDECTOMY  12/2000   [name] 12/22/2010  . BIV UPGRADE N/A 02/22/2018   Procedure: BIV UPGRADE;  Surgeon: [name], MD;  Location: [place] CV LAB;  Service: Cardiovascular;  Laterality: N/A;  . BIV UPGRADE N/A 03/29/2018   Procedure: BIV PPM UPGRADE;  Surgeon: [name], MD;  Location: [place] CV LAB;  Service: Cardiovascular;  Laterality: N/A;  . CARDIAC CATHETERIZATION  11/07/2008   nonischemic cardiomyopathy,pulmonary hypertension  . CATARACT EXTRACTION W/ INTRAOCULAR LENS  IMPLANT, BILATERAL Bilateral   . CIRCUMCISION  12/2005   [name] 12/22/2010  .  COLOSTOMY  12/2000   [name] 12/22/2010  . COLOSTOMY REVERSAL  07/2001   [name] 12/22/2010  . CORONARY ANGIOPLASTY  06/01/1999   successful to ostium of the first diagonal  . ESOPHAGOGASTRODUODENOSCOPY N/A 08/22/2013   Procedure: ESOPHAGOGASTRODUODENOSCOPY (EGD);  Surgeon: [name], MD;  Location[facility] ENDOSCOPY;  Service: Endoscopy;  Laterality: N/A;  h/p in file cabinet, jackie  . ESOPHAGOGASTRODUODENOSCOPY N/A 11/05/2014   Procedure: ESOPHAGOGASTRODUODENOSCOPY (EGD);  Surgeon: Clarene Essex, MD;  Location: [facility] ENDOSCOPY;  Service: Endoscopy;  Laterality: N/A;  . ESOPHAGOGASTRODUODENOSCOPY N/A 11/08/2016   Procedure: ESOPHAGOGASTRODUODENOSCOPY (EGD);  Surgeon: [name], MD;  Location: [facility] ENDOSCOPY;  Service: Endoscopy;  Laterality: N/A;  . ESOPHAGOGASTRODUODENOSCOPY (EGD) WITH PROPOFOL Left 11/05/2017   Procedure: ESOPHAGOGASTRODUODENOSCOPY (EGD) WITH PROPOFOL;  Surgeon: [name], MD;  Location: [place];  Service: Endoscopy;  Laterality: Left;  . HOT HEMOSTASIS N/A 11/05/2014   Procedure: HOT HEMOSTASIS (ARGON PLASMA COAGULATION/BICAP);  Surgeon: Clarene Essex, MD;  Location: [facility] ENDOSCOPY;  Service: Endoscopy;  Laterality: N/A;  . INSERT / REPLACE / REMOVE PACEMAKER    . JOINT REPLACEMENT    . MASS EXCISION Left    hand w/ulnar artery reconstruction/notes 12/22/2010  . NM MYOCAR PERF WALL MOTION  11/24/2007   normal  . PERMANENT PACEMAKER INSERTION  10/04/2012   [employer]  . PERMANENT PACEMAKER INSERTION N/A 10/12/2011   Procedure: PERMANENT PACEMAKER INSERTION;  Surgeon: [name], MD;  Location: [place] CATH LAB;  Service: Cardiovascular;  Laterality: N/A;  . REPLACEMENT TOTAL KNEE Bilateral 11/2006   right-left/notes 12/22/2010  . ROTATOR CUFF REPAIR Right 09/2004   [name] 12/22/2010  . SAVORY DILATION N/A 08/22/2013   Procedure: SAVORY DILATION;  Surgeon: [name], MD;  Location: [facility] ENDOSCOPY;  Service: Endoscopy;  Laterality: N/A;  . SHOULDER SURGERY Right    "fell  off house; messed up 3 things in my arm"  . TONSILLECTOMY    . US ECHOCARDIOGRAPHY  02/01/2011   LA is mod-severely dilated,AOV & root sclerotic,ca+ AOV leaflets    Allergies: Vicodin [hydrocodone-acetaminophen]  Medications: Prior to Admission medications   Medication Sig Start Date End Date Taking? Authorizing Provider  allopurinol (ZYLOPRIM) 100 MG tablet Take 2 tablets (200 mg total) by mouth daily. 11/09/16  Yes Theodis Blaze, MD  dutasteride (AVODART) 0.5 MG capsule Take 0.5 mg by mouth daily.   Yes [provider]  escitalopram (LEXAPRO) 5 MG tablet Take 1 tablet (5 mg total) by mouth at bedtime. 11/09/16  Yes Theodis Blaze, MD  furosemide (LASIX) 80 MG tablet Take 1 tablet (80 mg total) by mouth 2 (two) times daily. 01/25/18  Yes Lendon Colonel, NP  hydrOXYzine (ATARAX/VISTARIL) 25 MG tablet TAKE 1 TABLET BY MOUTH EVERY 6 HOURS AS NEEDED FOR  ITCHING Patient taking differently: Take 25 mg by mouth every 6 (six) hours as needed for itching.  01/11/18  Yes Croitoru, Mihai, MD  mirtazapine (REMERON) 15 MG tablet Take 15 mg by mouth at bedtime.   Yes [provider]  Multiple Vitamin (MULTIVITAMIN WITH MINERALS) TABS tablet Take 1 tablet by mouth daily.   Yes [provider]  Multiple Vitamins-Minerals (PRESERVISION AREDS 2) CAPS Take 1 capsule by mouth daily.   Yes [provider]  mupirocin ointment (BACTROBAN) 2 % Apply 1 application topically 2 (two) times daily. To toe 05/01/18  Yes [provider]  pantoprazole (PROTONIX) 40 MG tablet Take 1 tablet (40 mg total) by mouth daily. 11/08/17  Yes Nita Sells, MD  potassium chloride (K-DUR) 10 MEQ tablet Take 10 mEq by mouth 2 (two) times daily.   Yes [provider]  rOPINIRole (REQUIP) 4 MG tablet Take 4 mg by mouth at bedtime.   Yes [provider]  Tamsulosin HCl (FLOMAX) 0.4 MG CAPS Take 0.4 mg by mouth daily after breakfast.   Yes [provider]    traMADol (ULTRAM) 50 MG tablet Take 100 mg by mouth 2 (two) times daily.    Yes [provider]  triamcinolone cream (KENALOG) 0.1 % Apply 1 application topically 2 (two) times daily.  05/01/18  Yes [provider]  OXYGEN Inhale 2 L into the lungs daily.    [provider]     Family History  Problem Relation Age of Onset  . Cancer Mother   . Heart attack Father   . Diabetes Brother     Social History   Socioeconomic History  . Marital status: Married    Spouse name: Not on file  . Number of children: Not on file  . Years of education: Not on file  . Highest education level: Not on file  Occupational History  . Occupation: retired  Scientific laboratory technician  . Financial resource strain: Not on file  . Food insecurity:    Worry: Not on file    Inability: Not on file  . Transportation needs:    Medical: Not on file    Non-medical: Not on file  Tobacco Use  . Smoking status: Former Research scientist (life sciences)  . Smokeless tobacco: Never Used  . Tobacco comment: 09/30/2017 "it's been over 71yrsince I smoked anything"  Substance and Sexual Activity  . Alcohol use: No  . Drug use: No  . Sexual activity: Not Currently  Lifestyle  . Physical activity:    Days per week: Not on file    Minutes per session: Not on file  . Stress: Not on file  Relationships  . Social connections:    Talks on phone: Not on file    Gets together: Not on file    Attends religious service: Not on file    Active member of club or organization: Not on file    Attends meetings of clubs or organizations: Not on file    Relationship status: Not on file  Other Topics Concern  . Not on file  Social History Narrative  . Not on file     Review of Systems: A 12 point ROS discussed and pertinent positives are indicated in the HPI above.  All other systems are negative.  Review of Systems  Constitutional: Positive for activity change and fatigue. Negative for fever.  Respiratory: Positive for shortness  of breath. Negative for cough.   Cardiovascular: Negative for chest pain.  Gastrointestinal: Negative for abdominal pain, nausea and vomiting.  Musculoskeletal: Negative for back pain.  Psychiatric/Behavioral: Negative for behavioral problems and confusion.    Vital Signs: BP (!) 95/45 (BP Location: Left Arm)   Pulse 75   Temp 98.1 F (36.7 C) (Oral)   Resp 18   Ht _0  (1.702 m)   Wt 194 lb 0.1 oz (88 kg)   SpO2 95%   BMI 30.39 kg/m   Physical Exam  Constitutional: He is oriented to person, place, and time. He appears well-developed. No distress.  Cardiovascular: Normal rate, regular rhythm and normal heart sounds. Exam reveals no gallop and no friction rub.  No murmur heard. Pulmonary/Chest: Effort normal and breath sounds normal. No respiratory distress.  Abdominal: Soft. He exhibits no distension.  Neurological: He is alert and oriented to person, place, and time.  Skin: Skin is warm and dry. He is not diaphoretic.  Psychiatric: He has a normal mood and affect. His behavior is normal. Judgment and thought content normal.  Nursing note and vitals reviewed.    MD Evaluation Airway: WNL Heart: WNL Abdomen: WNL Chest/ Lungs: WNL ASA  Classification: 3 Mallampati/Airway Score: Two   Imaging: Dg Chest 2 View  Result Date: 05/03/2018 CLINICAL DATA:  84year old male with CHF. EXAM: CHEST - 2 VIEW COMPARISON:  Chest radiograph dated 03/30/2018 FINDINGS: There is cardiomegaly with mild vascular congestion and probable mild edema. No focal consolidation, pleural effusion, or pneumothorax. There is atherosclerotic calcification of the aorta. The aorta is tortuous. Left pectoral pacemaker device. Osteopenia with degenerative changes of the spine. No acute osseous pathology. IMPRESSION: Cardiomegaly with mild vascular congestion and probable mild interstitial edema. Electronically Signed   By: AAnner CreteM.D.   On: 05/03/2018 04:53   [place]al  Result Date:  05/05/2018 CLINICAL DATA:  Acute renal injury EXAM: RENAL / URINARY TRACT ULTRASOUND COMPLETE COMPARISON:  01/10/2018 FINDINGS: Right Kidney: Length: 12.6 cm. Multiple cysts are identified. The largest of these measures 3.9 cm in greatest dimension and stable from the prior exam. No hydronephrosis is noted. Left Kidney: Length: 13.2 cm. Multiple cysts are noted. The largest of these measures 6.8 cm in greatest dimension also stable from the previous  exam. No hydronephrosis is noted. Bladder: Decompressed Small pleural effusions are noted bilaterally. Minimal ascites is seen as well. IMPRESSION: Bilateral renal cysts stable from the prior CT examination. Bilateral pleural effusions and mild ascites. Electronically Signed   By: Inez Catalina M.D.   On: 05/05/2018 06:12    Labs:  CBC: Recent Labs    05/04/18 0011 05/05/18 0416 05/06/18 0554 05/07/18 0400  WBC 3.4* 3.8* 3.8* 2.6*  HGB 8.7* 8.1* 9.0* 8.3*  HCT 28.4* 26.0* 28.7* 25.8*  PLT 113* 106* 98* 91*    COAGS: Recent Labs    09/26/17 0229  INR 1.57    BMP: Recent Labs    05/04/18 0011 05/05/18 0416 05/06/18 0554 05/07/18 0400  NA 140 140 140 142  K 4.2 3.6 3.2* 3.6  CL 100 98 93* 93*  CO2 31 34* 38* 43*  GLUCOSE 109* 88 93 94  BUN 71* 74* 72* 69*  CALCIUM 8.8* 8.8* 8.9 8.9  CREATININE 1.95* 1.95* 1.61* 1.47*  GFRNONAA 30* 30* 38* 42*  GFRAA 35* 35* 44* 49*    LIVER FUNCTION TESTS: Recent Labs    11/06/17 0416  01/13/18 0523 01/14/18 0756 05/05/18 0416 05/05/18 0749 05/06/18 0554 05/07/18 0400  BILITOT 1.7*  --  0.8 0.8  --  1.3*  --   --   AST 35  --  104* 75*  --  27  --   --   ALT 19  --  117* 104*  --  35  --   --   ALKPHOS 46  --  91 90  --  74  --   --   PROT 5.3*  --  6.7 6.4*  --  6.4*  --   --   ALBUMIN 2.8*   < > 3.1* 2.9* 3.0* 2.9* 3.2* 3.0*   < > = values in this interval not displayed.    TUMOR MARKERS: No results for input(s): AFPTM, CEA, CA199, CHROMGRNA in the last 8760  hours.  Assessment and Plan: Pancytopenia Present for the past 3 months.  Admitted with CHF, shortness of breath, edema.  Request for bone marrow biopsy at the request of Dr. Marin Olp.   Patient has been seen and consented for possible inpatient biopsy, however will need to coordinate with cytology once available.  NPO after midnight.  If patient to discharge prior to biopsy performed, an outpatient order can be placed for outpatient biopsy with IR.   Risks and benefits discussed with the patient including, but not limited to bleeding, infection, damage to adjacent structures or low yield requiring additional tests.  All of the patient's questions were answered, patient is agreeable to proceed. Consent signed and in chart.  Thank you for this interesting consult.  I greatly enjoyed meeting Anthony BIFULCO Sr. and look forward to participating in their care.  A copy of this report was sent to the requesting provider on this date.  Electronically Signed: Docia Barrier, PA 05/07/2018, 1:34 PM   I spent a total of 40 Minutes    in face to face in clinical consultation, greater than 50% of which was counseling/coordinating care for pancytopenia.

## 2018-05-08 ENCOUNTER — Inpatient Hospital Stay (HOSPITAL_COMMUNITY): Payer: Medicare Other

## 2018-05-08 ENCOUNTER — Telehealth: Payer: Self-pay | Admitting: Physician Assistant

## 2018-05-08 DIAGNOSIS — E611 Iron deficiency: Secondary | ICD-10-CM

## 2018-05-08 DIAGNOSIS — D469 Myelodysplastic syndrome, unspecified: Secondary | ICD-10-CM

## 2018-05-08 LAB — CBC WITH DIFFERENTIAL/PLATELET
BASOS PCT: 1 %
Basophils Absolute: 0 10*3/uL (ref 0.0–0.1)
EOS ABS: 0.1 10*3/uL (ref 0.0–0.7)
EOS PCT: 2 %
HCT: 27 % — ABNORMAL LOW (ref 39.0–52.0)
Hemoglobin: 8.5 g/dL — ABNORMAL LOW (ref 13.0–17.0)
LYMPHS PCT: 22 %
Lymphs Abs: 0.7 10*3/uL (ref 0.7–4.0)
MCH: 36.5 pg — AB (ref 26.0–34.0)
MCHC: 31.5 g/dL (ref 30.0–36.0)
MCV: 115.9 fL — AB (ref 78.0–100.0)
Monocytes Absolute: 0.5 10*3/uL (ref 0.1–1.0)
Monocytes Relative: 14 %
Neutro Abs: 2 10*3/uL (ref 1.7–7.7)
Neutrophils Relative %: 61 %
PLATELETS: 109 10*3/uL — AB (ref 150–400)
RBC: 2.33 MIL/uL — ABNORMAL LOW (ref 4.22–5.81)
RDW: 17.4 % — AB (ref 11.5–15.5)
WBC: 3.3 10*3/uL — ABNORMAL LOW (ref 4.0–10.5)

## 2018-05-08 LAB — PROTEIN ELECTROPHORESIS, SERUM
A/G RATIO SPE: 1.1 (ref 0.7–1.7)
ALBUMIN ELP: 3.4 g/dL (ref 2.9–4.4)
Alpha-1-Globulin: 0.2 g/dL (ref 0.0–0.4)
Alpha-2-Globulin: 0.5 g/dL (ref 0.4–1.0)
BETA GLOBULIN: 1.1 g/dL (ref 0.7–1.3)
Gamma Globulin: 1.3 g/dL (ref 0.4–1.8)
Globulin, Total: 3.2 g/dL (ref 2.2–3.9)
Total Protein ELP: 6.6 g/dL (ref 6.0–8.5)

## 2018-05-08 LAB — RENAL FUNCTION PANEL
ANION GAP: 7 (ref 5–15)
Albumin: 3.1 g/dL — ABNORMAL LOW (ref 3.5–5.0)
BUN: 63 mg/dL — ABNORMAL HIGH (ref 8–23)
CALCIUM: 9.4 mg/dL (ref 8.9–10.3)
CO2: 44 mmol/L — AB (ref 22–32)
CREATININE: 1.32 mg/dL — AB (ref 0.61–1.24)
Chloride: 92 mmol/L — ABNORMAL LOW (ref 98–111)
GFR calc Af Amer: 55 mL/min — ABNORMAL LOW (ref >60)
GFR calc non Af Amer: 48 mL/min — ABNORMAL LOW (ref >60)
GLUCOSE: 97 mg/dL (ref 70–99)
Phosphorus: 2.9 mg/dL (ref 2.5–4.6)
Potassium: 4.2 mmol/L (ref 3.5–5.1)
SODIUM: 143 mmol/L (ref 135–145)

## 2018-05-08 LAB — PROTIME-INR
INR: 1.31
Prothrombin Time: 16.1 seconds — ABNORMAL HIGH (ref 11.4–15.2)

## 2018-05-08 LAB — IMMUNOFIXATION, URINE

## 2018-05-08 LAB — PATHOLOGIST SMEAR REVIEW

## 2018-05-08 MED ORDER — MIDAZOLAM HCL 2 MG/2ML IJ SOLN
INTRAMUSCULAR | Status: AC
  Filled 2018-05-08: qty 2

## 2018-05-08 MED ORDER — MIDAZOLAM HCL 2 MG/2ML IJ SOLN
INTRAMUSCULAR | Status: AC | PRN
  Administered 2018-05-08: 1 mg via INTRAVENOUS

## 2018-05-08 MED ORDER — LIDOCAINE HCL 1 % IJ SOLN
INTRAMUSCULAR | Status: AC
  Filled 2018-05-08: qty 20

## 2018-05-08 MED ORDER — FENTANYL CITRATE (PF) 100 MCG/2ML IJ SOLN
INTRAMUSCULAR | Status: AC | PRN
  Administered 2018-05-08: 50 ug via INTRAVENOUS

## 2018-05-08 MED ORDER — FENTANYL CITRATE (PF) 100 MCG/2ML IJ SOLN
INTRAMUSCULAR | Status: AC
  Filled 2018-05-08: qty 2

## 2018-05-08 NOTE — Progress Notes (Signed)
PROGRESS NOTE    Abyan Cadman  OHY:[id number] DOB: March 09, 1932 DOA: 05/03/2018 PCP: [name], MD   Brief Narrative: Anthony Agar Sr. is a 84 y.o. male with medical history significant of RLS; pulmonary HTN; pacemaker placement; afib on Eliquis; OSA on CPAP; HTN; HLD; CAD; stage 3 CKD; and CHF (EF 25-30% in 6/19) presenting with SOB. He has had symptoms for a couple of days. He has been breaking out in a rash and itching all over, as well.  The rash has been present for a couple of days. +LE edema.  Weight increased from 179-191.  No orthopnea.  He wears 2L home O2, but this AM his sats were 84% on 2L.  ED Course:  CHF exacerbation.  SOB, edema, weight gain. He had an LV lead placed and symptoms since.  He went on a cruise and got symptoms there, received Lasix.  On 2L home O2, sats in 70s today so increased O2.  Bedside ultrasound without obvious pericardial effusion.  Petechiae on LE, new x days - platelets 118.  He was started on Doxy 3 days ago for ?MRSA rash - ?related to doxy.  Assessment & Plan:   Principal Problem:   Acute on chronic respiratory failure with hypoxia (HCC) Active Problems:   OSA on CPAP   BPH (benign prostatic hyperplasia)   Chronic atrial fibrillation (HCC)   Acute renal failure superimposed on stage 3 chronic kidney disease (HCC)   Biventricular cardiac pacemaker  st [place]  dual chamber with LV in atrial port    Acute on chronic systolic congestive heart failure (HCC)   Petechial rash   AKI (acute kidney injury) ([place])  Acute on chronic hypoxic and hypercapnic respiratory failure, secondary to CHF exacerbation.  COPD on 3 L oxygen  Presents with hypoxemia, BNP 1865. AKI.  Patient notice to be more lethargic, confuse on 9-26. Blood gas showed PH 7.2 PCO2 74, po2 ; 78 Patient was  transfer to step down unit, CCM consulted. Improved on BIPAP. Needs to use BIPAP at HS.  On oral lasix. BIPAP at HS.   Acute on chronic systolic heart failure  exacerbation;  Presents with hypoxemia, BNP elevated, chest x ray; cardiomegaly, mild vascular congestion , interstitial edema.  Continue with IV lasix  , metolazone.  Appreciate cardiology and nephrology  assistance.  Treated with IV lasix and metolazone. Cr trending down.  He was transition to oral lasix. On metolazone.   Acute on chronic renal failure stage II  Suspect related to heart failure. Cr baseline 1.2.  Continue with IV lasix.  Nephrology consulted per wife request.  Urine out put 3.2  l yesterday, cr trending down.  Protein electrophoresis ordered elevated light and kappa . Oncology following.  Cr today 1.4---peak to 1.9 this admission.  Cr trending down. Nephrology sign off.   Acute metabolic encephalopathy;  Notice to be more lethargic and confuse 9-26.  ABG PCO at 74, Ph 7.2.  Started BIPAP. Improved on BIPAP> Discontinue schedule tramadol.  Hold Remeron.  Ammonia mildly elevated at 42. Started on lactulose. Ammonia normalized.  Resolved. He was alert and conversant this morning.   A fib; not on anticoagulation due to GI bleed history. Biventricular pacer placement  Pancytopenia;  3 months ago he had low WBC, and low hb. Platelet count also mildly low.  B12 level; 861. Peripheral smear ordered. LDH 313 Dr Marin Olp consulted. Plan for bone marrow biopsy today.  erythropoietin ordered.  Count continue to drop. Repeat hb  in am to determine need for blood transfusion. .   Rash; -Petechial rash  Patient was recently started on Doxy for rash - unclear if that was for current petechial rash or if the current rash developed subsequent to starting doxy Holding doxy.  Monitor.   BPH; Continue Avodart and Flomax  OSA on CPAP Need to make sure patient use BIPAP at night. Discussed with nursing staff,   Hypokalemia resolved. On oral supplement. Monitor.   DVT prophylaxis; hold Lovenox, platelet continue to decreased. Start SCD.  Code Status: full code.  Family  Communication: wife at bedside 9-26. Care discussed with patient 9-30 Disposition Plan: transition   Consultants:   Cardiology  Pulmonology  Nephrology    Procedures:   Renal US   Antimicrobials:   none   Subjective: He was alert this morning. Answering questions. Breathing better   Objective: Vitals:   05/08/18 1052 05/08/18 1059 05/08/18 1340 05/08/18 1500  BP:  (!) 90/44 114/69 136/61  Pulse: 73 74 73 100  Resp: _0 (!) 41  Temp:      TempSrc:      SpO2: 99% 98% 96% 100%  Weight:      Height:        Intake/Output Summary (Last 24 hours) at 05/08/2018 1608 Last data filed at 05/08/2018 1432 Gross per 24 hour  Intake 110 ml  Output 2520 ml  Net -2410 ml   Filed Weights   05/05/18 0440 05/06/18 0328 05/08/18 0347  Weight: 88.1 kg 88 kg 87.5 kg    Examination:  General exam; NAD Respiratory system: Crackles bases.  Cardiovascular system: S 1, S 2 RRR Gastrointestinal system; BS present, soft, nt Central nervous system: alert, sitting bed, following command.  Extremities: petechia rash     Data Reviewed: I have personally reviewed following labs and imaging studies  CBC: Recent Labs  Lab 05/04/18 0011 05/05/18 0416 05/06/18 0554 05/07/18 0400 05/08/18 0409  WBC 3.4* 3.8* 3.8* 2.6* 3.3*  NEUTROABS 2.3  --  2.9 1.6* 2.0  HGB 8.7* 8.1* 9.0* 8.3* 8.5*  HCT 28.4* 26.0* 28.7* 25.8* 27.0*  MCV 116.4* 117.6* 115.3* 113.7* 115.9*  PLT 113* 106* 98* 91* 163*   Basic Metabolic Panel: Recent Labs  Lab 05/04/18 0011 05/05/18 0416 05/06/18 0554 05/07/18 0400 05/08/18 0409  NA 140 140 140 142 143  K 4.2 3.6 3.2* 3.6 4.2  CL 100 98 93* 93* 92*  CO2 31 34* 38* 43* 44*  GLUCOSE 109* 88 93 94 97  BUN 71* 74* 72* 69* 63*  CREATININE 1.95* 1.95* 1.61* 1.47* 1.32*  CALCIUM 8.8* 8.8* 8.9 8.9 9.4  PHOS  --  4.4 3.3 2.8 2.9   GFR: Estimated Creatinine Clearance: 44 mL/min (A) (by C-G formula based on SCr of 1.32 mg/dL (H)). Liver Function  Tests: Recent Labs  Lab 05/05/18 0416 05/05/18 0749 05/06/18 0554 05/07/18 0400 05/08/18 0409  AST  --  27  --   --   --   ALT  --  35  --   --   --   ALKPHOS  --  74  --   --   --   BILITOT  --  1.3*  --   --   --   PROT  --  6.4*  --   --   --   ALBUMIN 3.0* 2.9* 3.2* 3.0* 3.1*   No results for input(s): LIPASE, AMYLASE in the last 168 hours. Recent Labs  Lab 05/04/18 1255 05/05/18  1110  AMMONIA 42* 19   Coagulation Profile: Recent Labs  Lab 05/08/18 0409  INR 1.31   Cardiac Enzymes: No results for input(s): CKTOTAL, CKMB, CKMBINDEX, TROPONINI in the last 168 hours. BNP (last 3 results) No results for input(s): PROBNP in the last 8760 hours. HbA1C: No results for input(s): HGBA1C in the last 72 hours. CBG: Recent Labs  Lab 05/04/18 1207  GLUCAP 139*   Lipid Profile: No results for input(s): CHOL, HDL, LDLCALC, TRIG, CHOLHDL, LDLDIRECT in the last 72 hours. Thyroid Function Tests: No results for input(s): TSH, T4TOTAL, FREET4, T3FREE, THYROIDAB in the last 72 hours. Anemia Panel: Recent Labs    05/06/18 0554  RETICCTPCT 2.7   Sepsis Labs: No results for input(s): PROCALCITON, LATICACIDVEN in the last 168 hours.  Recent Results (from the past 240 hour(s))  MRSA PCR Screening     Status: None   Collection Time: 05/04/18  4:20 PM  Result Value Ref Range Status   MRSA by PCR NEGATIVE NEGATIVE Final    Comment:        The GeneXpert MRSA Assay (FDA approved for NASAL specimens only), is one component of a comprehensive MRSA colonization surveillance program. It is not intended to diagnose MRSA infection nor to guide or monitor treatment for MRSA infections. Performed at [facility], [place] [address] [place], [place] [address]          Radiology Studies: Ct Bone Marrow Biopsy & Aspiration  Result Date: 05/08/2018 INDICATION: 84 year old male with a history of anemia EXAM: CT BONE MARROW BIOPSY AND ASPIRATION MEDICATIONS: None.  ANESTHESIA/SEDATION: Moderate (conscious) sedation was employed during this procedure. A total of Versed 1.0 mg and Fentanyl 50 mcg was administered intravenously. Moderate Sedation Time: 10 minutes. The patient's level of consciousness and vital signs were monitored continuously by radiology nursing throughout the procedure under my direct supervision. FLUOROSCOPY TIME:  CT COMPLICATIONS: None PROCEDURE: The procedure risks, benefits, and alternatives were explained to the patient. Questions regarding the procedure were encouraged and answered. The patient understands and consents to the procedure. Scout CT of the pelvis was performed for surgical planning purposes. The posterior pelvis was prepped with Chlorhexidine in a sterile fashion, and a sterile drape was applied covering the operative field. A sterile gown and sterile gloves were used for the procedure. Local anesthesia was provided with 1% Lidocaine. Posterior iliac bone was targeted for biopsy. The skin and subcutaneous tissues were infiltrated with 1% lidocaine without epinephrine. A small stab incision was made with an 11 blade scalpel, and an 11 gauge Murphy needle was advanced with CT guidance to the posterior cortex. Manual forced was used to advance the needle through the posterior cortex and the stylet was removed. A bone marrow aspirate was retrieved and passed to a cytotechnologist in the room. The Murphy needle was then advanced without the stylet for a core biopsy. The core biopsy was retrieved and also passed to a cytotechnologist. Manual pressure was used for hemostasis and a sterile dressing was placed. No complications were encountered no significant blood loss was encountered. Patient tolerated the procedure well and remained hemodynamically stable throughout. IMPRESSION: Status post CT-guided bone marrow biopsy, with tissue specimen sent to pathology for complete histopathologic analysis Signed, Dulcy Fanny. Earleen Newport, DO [facility] Radiology Electronically Signed   By: Corrie Mckusick D.O.   On: 05/08/2018 11:16        Scheduled Meds: . allopurinol  200 mg Oral Daily  . aspirin  EC  81 mg Oral Daily  . dutasteride  0.5 mg Oral Daily  . escitalopram  5 mg Oral QHS  . fentaNYL      . furosemide  80 mg Oral BID  . ipratropium-albuterol  3 mL Nebulization TID  . lactulose  20 g Oral Daily  . lidocaine      . metolazone  2.5 mg Oral Daily  . midazolam      . multivitamin  1 tablet Oral Daily  . pantoprazole  40 mg Oral Daily  . potassium chloride  40 mEq Oral BID  . rOPINIRole  4 mg Oral QHS  . sodium chloride flush  3 mL Intravenous Q12H  . tamsulosin  0.4 mg Oral QPC breakfast   Continuous Infusions: . sodium chloride    . ferric gluconate (FERRLECIT/NULECIT) IV 125 mg (05/08/18 1055)     LOS: 5 days    Time spent: 35 minutes.     Elmarie Shiley, MD [facility] Pager [phone]  If 7PM-7AM, please contact night-coverage [email or web address] Password [email or web address] 05/08/2018, 4:08 PM

## 2018-05-08 NOTE — Progress Notes (Signed)
[name] is going for his bone marrow biopsy today.  He is iron deficient.  He is getting daily IV iron.  His erythropoietin level is actually a bit higher than I thought it would be.  The Epo level is 130.  He says he is eating fairly well.  He is not having any obvious diarrhea.  His hemoglobin is 8.5 today.  He still has a markedly elevated MCV of 116.  I really have to believe that this is going to be myelodysplasia with him being iron deficient but yet his MCV is so high.  His reticulocyte count, when corrected, is on the low side.  If the bone marrow biopsy is done today, then I would think that his results should be out on Wednesday.  He probably would not be a bad idea to give him Aranesp.  However, I would wait until after he has the biopsy done.  There is no change in his physical exam.  His blood pressure is 123/80.  Pulse is 70.  Temperature 98.6.  Again, we will have to see what the bone marrow test shows.  [name], MD  1Peter 4:8

## 2018-05-08 NOTE — Plan of Care (Signed)
  Problem: Education: Goal: Knowledge of General Education information will improve Description Including pain rating scale, medication(s)/side effects and non-pharmacologic comfort measures Outcome: Progressing   Problem: Health Behavior/Discharge Planning: Goal: Ability to manage health-related needs will improve Outcome: Progressing   Problem: Clinical Measurements: Goal: Ability to maintain clinical measurements within normal limits will improve Outcome: Progressing Goal: Will remain free from infection Outcome: Progressing Goal: Diagnostic test results will improve Outcome: Progressing Goal: Respiratory complications will improve Outcome: Progressing Goal: Cardiovascular complication will be avoided Outcome: Progressing   Problem: Activity: Goal: Risk for activity intolerance will decrease Outcome: Progressing   Problem: Nutrition: Goal: Adequate nutrition will be maintained Outcome: Progressing   Problem: Coping: Goal: Level of anxiety will decrease Outcome: Progressing   Problem: Elimination: Goal: Will not experience complications related to bowel motility Outcome: Progressing Goal: Will not experience complications related to urinary retention Outcome: Progressing   Problem: Pain Managment: Goal: General experience of comfort will improve Outcome: Progressing   Problem: Skin Integrity: Goal: Risk for impaired skin integrity will decrease Outcome: Progressing   Problem: Education: Goal: Ability to demonstrate management of disease process will improve Outcome: Progressing Goal: Ability to verbalize understanding of medication therapies will improve Outcome: Progressing Goal: Individualized Educational Video(s) Outcome: Progressing   Problem: Activity: Goal: Capacity to carry out activities will improve Outcome: Progressing   Problem: Cardiac: Goal: Ability to achieve and maintain adequate cardiopulmonary perfusion will improve Outcome:  Progressing   

## 2018-05-08 NOTE — Progress Notes (Signed)
OT Cancellation Note  Patient Details Name: Anthony SCHMID Sr. MRN: [id number] DOB: 11-04-1931   Cancelled Treatment:    Reason Eval/Treat Not Completed: Patient at procedure or test/ unavailable(bone marrow biopsy)  Vonita Moss, OTR/L  Acute Rehabilitation Services Pager: [phone] Office: [phone] .  05/08/2018, 10:05 AM

## 2018-05-08 NOTE — Telephone Encounter (Signed)
New message  Per Daleen Snook, patient is not being discharged today. TOC Appointment scheduled 05/19/2018 @10 :00 am with Almyra Deforest.

## 2018-05-08 NOTE — Progress Notes (Signed)
PT Cancellation Note  Patient Details Name: Anthony DEBOARD Sr. MRN: [id number] DOB: 1932/01/01   Cancelled Treatment:    Reason Eval/Treat Not Completed: Patient at procedure or test/unavailable Per nursing, off unit for bone marrow biopsy. Will follow-up as time allows.   Lanney Gins, PT, DPT Supplemental Physical Therapist 05/08/18 9:04 AM Pager: [phone] Office: [phone]

## 2018-05-08 NOTE — Procedures (Signed)
Interventional Radiology Procedure Note  Procedure: CT guided aspirate and core biopsy of left iliac bone Complications: None Recommendations: - Bedrest supine x 1 hrs - OTC's PRN  Pain - Follow biopsy results  Signed,  Akua Blethen S. Arzell Mcgeehan, DO      

## 2018-05-08 NOTE — Progress Notes (Addendum)
Progress Note  Patient Name: Anthony MARGRAF Sr. Date of Encounter: 05/08/2018  Primary Cardiologist: [name], MD   Subjective   Patient is somnolent. When asked if he was having chest pain or difficulty breathing, he states "I dont know yet". Does not answer any further questions.   Inpatient Medications    Scheduled Meds: . allopurinol  200 mg Oral Daily  . aspirin EC  81 mg Oral Daily  . dutasteride  0.5 mg Oral Daily  . escitalopram  5 mg Oral QHS  . fentaNYL      . furosemide  80 mg Oral BID  . ipratropium-albuterol  3 mL Nebulization TID  . lactulose  20 g Oral Daily  . lidocaine      . metolazone  2.5 mg Oral Daily  . midazolam      . multivitamin  1 tablet Oral Daily  . pantoprazole  40 mg Oral Daily  . potassium chloride  40 mEq Oral BID  . rOPINIRole  4 mg Oral QHS  . sodium chloride flush  3 mL Intravenous Q12H  . tamsulosin  0.4 mg Oral QPC breakfast   Continuous Infusions: . sodium chloride    . ferric gluconate (FERRLECIT/NULECIT) IV 125 mg (05/08/18 1055)   PRN Meds: sodium chloride, acetaminophen, diphenhydrAMINE-zinc acetate, hydrOXYzine, ipratropium-albuterol, ondansetron (ZOFRAN) IV, sodium chloride flush   Vital Signs    Vitals:   05/08/18 1042 05/08/18 1046 05/08/18 1052 05/08/18 1059  BP: (!) 97/50   (!) 90/44  Pulse: 71  73 74  Resp: '16  20 14  ' Temp: 98.8 F (37.1 C)     TempSrc: Oral     SpO2: (!) 87% (!) 77% 99% 98%  Weight:      Height:        Intake/Output Summary (Last 24 hours) at 05/08/2018 1113 Last data filed at 05/08/2018 0700 Gross per 24 hour  Intake 110 ml  Output 1820 ml  Net -1710 ml   Filed Weights   05/05/18 0440 05/06/18 0328 05/08/18 0347  Weight: 88.1 kg 88 kg 87.5 kg    Telemetry    Paced rhythm - Personally Reviewed  Physical Exam   GEN: Laying on his side comfortably in no acute distress.   Neck: Unable to assess JVD due to patient positioning and lack of cooperation Cardiac: RRR, no  murmurs, rubs, or gallops.  Respiratory: Clear to auscultation bilaterally, no wheezes/ rales/ rhonchi GI: NABS, Soft, nontender, non-distended  MS: No edema; No deformity. Neuro:  Nonfocal, moving all extremities spontaneously Psych: Normal affect   Labs    Chemistry Recent Labs  Lab 05/05/18 0749 05/06/18 0554 05/07/18 0400 05/08/18 0409  NA  --  140 142 143  K  --  3.2* 3.6 4.2  CL  --  93* 93* 92*  CO2  --  38* 43* 44*  GLUCOSE  --  93 94 97  BUN  --  72* 69* 63*  CREATININE  --  1.61* 1.47* 1.32*  CALCIUM  --  8.9 8.9 9.4  PROT 6.4*  --   --   --   ALBUMIN 2.9* 3.2* 3.0* 3.1*  AST 27  --   --   --   ALT 35  --   --   --   ALKPHOS 74  --   --   --   BILITOT 1.3*  --   --   --   GFRNONAA  --  38* 42* 48*  GFRAA  --  44* 49* 55*  ANIONGAP  --  '9 6 7     ' Hematology Recent Labs  Lab 05/06/18 0554 05/07/18 0400 05/08/18 0409  WBC 3.8* 2.6* 3.3*  RBC 2.49*  2.49* 2.27* 2.33*  HGB 9.0* 8.3* 8.5*  HCT 28.7* 25.8* 27.0*  MCV 115.3* 113.7* 115.9*  MCH 36.1* 36.6* 36.5*  MCHC 31.4 32.2 31.5  RDW 16.9* 17.1* 17.4*  PLT 98* 91* 109*    Cardiac EnzymesNo results for input(s): TROPONINI in the last 168 hours.  Recent Labs  Lab 05/03/18 0412  TROPIPOC 0.03     BNP Recent Labs  Lab 05/03/18 0406  BNP 1,865.0*     DDimer No results for input(s): DDIMER in the last 168 hours.   Radiology    No results found.  Cardiac Studies   Echocardiogram 01/2018: Study Conclusions  - Left ventricle: The cavity size was normal. Systolic function was   severely reduced. The estimated ejection fraction was in the   range of 25% to 30%. There is akinesis of the   mid-apicalanteroseptal and inferoseptal myocardium. The study is   not technically sufficient to allow evaluation of LV diastolic   function. - Aortic valve: Trileaflet; moderately thickened, moderately   calcified leaflets. Transvalvular velocity was minimally   increased. There was no stenosis. There  was mild regurgitation.   Peak velocity (S): 233 cm/s. Valve area (VTI): 1.49 cm^2. Valve   area (Vmax): 1.6 cm^2. Valve area (Vmean): 1.44 cm^2. - Mitral valve: There was mild regurgitation. - Left atrium: The atrium was severely dilated. - Right ventricle: The cavity size was moderately dilated. Wall   thickness was normal. Pacer wire or catheter noted in right   ventricle. - Right atrium: The atrium was severely dilated. - Tricuspid valve: There was moderate regurgitation. - Pulmonary arteries: Systolic pressure was moderately increased.   PA peak pressure: 53 mm Hg (S).   Patient Profile     84 y.o. male chronic systolic heart failurewith an EF of 25%, nonischemic cardiomyopathy(per cath 2010), pulmonary hypertension,RLS,OSA on CPAP, severe tricuspid regurgitation, permanent atrial fibrillation with slow ventricular responsenot on anticoagulation, single-chamber pacemakerin situ withrecent upgrade to CRT-P on 03/29/2018 per Dr. [name] and hypertensionwho is being followed by cardiology for the evaluation ofacuteCHF exacerbation.  Assessment & Plan    1. Acute on chronic combined CHF/non-ischemic cardiomyopathy: patient presented with SOB, LE edema, and hypoxia. He was diuresed with IV lasix and metolazone. He was transitioned from IV to home po lasix yesterday. Continues to maintain a net negative fluid balance with -2.1L in the past 24 hours and -9.9L this admission. Weight 196lbs on admission to 192.9lbs today. Still with some hypotension.  - Continue po lasix 43m BID - Could consider discontinuation of metolazone vs decreasing dose to TIW. Will defer to Dr. NJohnsie Cancel - Continue to monitor strict I&Os and daily weights  2. Permanent atrial fibrillation: paced rhythm. Not on anticoagulation given history of bleeding issues. - Continue routine outpt monitoring of PPM function  3. CKD stage 3: Cr stable at 1.3 today. - Continue close monitoring with ongoing diuresis  4.  COPD: on home O2 - Continue management per primary team  5. Pancytopenia and petechial rash: oncology following. Planning for bone marrow biopsy today.  - Continue management per primary team/oncology    For questions or updates, please contact [place] consult [email or web address] for contact info under Cardiology/STEMI.      Signed, KAbigail Butts PA-C  05/08/2018, 11:13 AM   [phone]  Patient examined chart reviewed CHF improved rhythm pacing  Has azotemia with elevated BUN Will stop zaroxyln  He is not very conversive this am but first time I have met   Jenkins Rouge

## 2018-05-09 ENCOUNTER — Inpatient Hospital Stay (HOSPITAL_COMMUNITY): Payer: Medicare Other

## 2018-05-09 LAB — RENAL FUNCTION PANEL
Albumin: 2.9 g/dL — ABNORMAL LOW (ref 3.5–5.0)
Anion gap: 5 (ref 5–15)
BUN: 60 mg/dL — ABNORMAL HIGH (ref 8–23)
CALCIUM: 9 mg/dL (ref 8.9–10.3)
CHLORIDE: 89 mmol/L — AB (ref 98–111)
CO2: 44 mmol/L — ABNORMAL HIGH (ref 22–32)
CREATININE: 1.34 mg/dL — AB (ref 0.61–1.24)
GFR, EST AFRICAN AMERICAN: 54 mL/min — AB (ref >60)
GFR, EST NON AFRICAN AMERICAN: 47 mL/min — AB (ref >60)
Glucose, Bld: 98 mg/dL (ref 70–99)
Phosphorus: 3.5 mg/dL (ref 2.5–4.6)
Potassium: 4.5 mmol/L (ref 3.5–5.1)
SODIUM: 138 mmol/L (ref 135–145)

## 2018-05-09 LAB — CBC WITH DIFFERENTIAL/PLATELET
Basophils Absolute: 0 10*3/uL (ref 0.0–0.1)
Basophils Relative: 1 %
EOS ABS: 0.1 10*3/uL (ref 0.0–0.7)
Eosinophils Relative: 3 %
HCT: 26.8 % — ABNORMAL LOW (ref 39.0–52.0)
Hemoglobin: 8.4 g/dL — ABNORMAL LOW (ref 13.0–17.0)
Lymphocytes Relative: 28 %
Lymphs Abs: 0.8 10*3/uL (ref 0.7–4.0)
MCH: 36.1 pg — ABNORMAL HIGH (ref 26.0–34.0)
MCHC: 31.3 g/dL (ref 30.0–36.0)
MCV: 115 fL — ABNORMAL HIGH (ref 78.0–100.0)
MONO ABS: 0.4 10*3/uL (ref 0.1–1.0)
Monocytes Relative: 15 %
NEUTROS PCT: 53 %
Neutro Abs: 1.4 10*3/uL — ABNORMAL LOW (ref 1.7–7.7)
PLATELETS: 96 10*3/uL — AB (ref 150–400)
RBC: 2.33 MIL/uL — AB (ref 4.22–5.81)
RDW: 17.5 % — ABNORMAL HIGH (ref 11.5–15.5)
WBC: 2.7 10*3/uL — AB (ref 4.0–10.5)

## 2018-05-09 NOTE — Clinical Social Work Note (Signed)
Clinical Social Work Assessment  Patient Details  Name: Anthony DEGRAZIA Sr. MRN: [id number] Date of Birth: 1932-06-16  Date of referral:  05/09/18               Reason for consult:  Discharge Planning                Permission sought to share information with:  Family Supports Permission granted to share information::  Yes, Verbal Permission Granted  Name::     [name]  Agency::  SNF  Relationship::  Spouse  Contact Information:  [phone]  Housing/Transportation Living arrangements for the past 2 months:  [place] of Information:  Patient Patient Interpreter Needed:  None Criminal Activity/Legal Involvement Pertinent to Current Situation/Hospitalization:  No - Comment as needed Significant Relationships:  Spouse Lives with:  Spouse Do you feel safe going back to the place where you live?  Yes Need for family participation in patient care:  Yes (Comment)  Care giving concerns:  No family at bedside. Patient lives at home with spouse. According to spouse patient has [place] from adult son   Facilities manager / plan:  CSW and intern met patient at bedside to offer support and discuss discharge plans. CSW spoke with patient about going to a skilled nursing facility for rehab and patient was agreeable. CSW will fax out and follow up with patient once bed offers are available.   Employment status:  Retired Forensic scientist:  Medicare PT Recommendations:  [place] / Referral to community resources:  [place]  Patient/Family's Response to care: Patient aware of his medical needs and is agreeable to SNF.    Patient/Family's Understanding of and Emotional Response to Diagnosis, Current Treatment, and Prognosis:  Patient and family agreeable with discharge plan to skilled nursing facility.   Emotional Assessment Appearance:  Appears stated age Attitude/Demeanor/Rapport:  Engaged Affect (typically  observed):  Appropriate, Calm Orientation:  Oriented to Self, Oriented to Situation, Oriented to Place, Oriented to  Time Alcohol / Substance use:  Never Used Psych involvement (Current and /or in the community):  No (Comment)  Discharge Needs  Concerns to be addressed:  Care Coordination Readmission within the last 30 days:  No Current discharge risk:  Dependent with Mobility Barriers to Discharge:  No Barriers Identified, Continued Medical Work up   [employer], LCSW 05/09/2018, 3:55 PM

## 2018-05-09 NOTE — Progress Notes (Signed)
Patient refuse NIV for the night, no distress or complications noted at this time.

## 2018-05-09 NOTE — Telephone Encounter (Signed)
CURRENTLY ADMITTED TO HOSPITAL

## 2018-05-09 NOTE — Progress Notes (Signed)
Pt states that his shoulder hurts and he wants an xray

## 2018-05-09 NOTE — Progress Notes (Addendum)
PROGRESS NOTE    [name]  PXT:[id number] DOB: 11-Oct-1931 DOA: 05/03/2018 PCP: [name], MD   Brief Narrative: Anthony Agar Sr. is a 84 y.o. male with medical history significant of RLS; pulmonary HTN; pacemaker placement; afib on Eliquis; OSA on CPAP; HTN; HLD; CAD; stage 3 CKD; and CHF (EF 25-30% in 6/19) presenting with SOB. He has had symptoms for a couple of days. He has been breaking out in a rash and itching all over, as well.  The rash has been present for a couple of days. +LE edema.  Weight increased from 179-191.  No orthopnea.  He wears 2L home O2, but this AM his sats were 84% on 2L.  ED Course:  CHF exacerbation.  SOB, edema, weight gain. He had an LV lead placed and symptoms since.  He went on a cruise and got symptoms there, received Lasix.  On 2L home O2, sats in 70s today so increased O2.  Bedside ultrasound without obvious pericardial effusion.  Petechiae on LE, new x days - platelets 118.  He was started on Doxy 3 days ago for ?MRSA rash - ?related to doxy.  Patient admitted with acute on chronic heart failure exacerbation and acute o chronic renal failure. He was treated with IV lasix and metolazone. Cardiology and nephrologist were assisting with his care. patient became more lethargic on 9-26, blood gas showed hypercapnic respiratory failure. Patient was place on BIPAP. CCM consulted. Patient subseqlty improved and became more alert.   He was also notice to have pancytopenia. Dr Marin Olp was consulted, who recommend Bone Marrow Bx. At this pint we are waiting for [facility] results   Assessment & Plan:   Principal Problem:   Acute on chronic respiratory failure with hypoxia (HCC) Active Problems:   OSA on CPAP   BPH (benign prostatic hyperplasia)   Chronic atrial fibrillation   Acute renal failure superimposed on stage 3 chronic kidney disease (HCC)   Biventricular cardiac pacemaker  st Judes  dual chamber with LV in atrial port    Acute on chronic  systolic congestive heart failure (HCC)   Petechial rash   AKI (acute kidney injury) ([place])  Acute on chronic hypoxic and hypercapnic respiratory failure, secondary to CHF exacerbation.  COPD on 3 L oxygen  Presents with hypoxemia, BNP 1865. AKI.  Patient notice to be more lethargic, confuse on 9-26. Blood gas showed PH 7.2 PCO2 74, po2 ; 78 Patient was  transfer to step down unit, CCM consulted. Improved on BIPAP. Needs to use BIPAP at HS.  On oral lasix. BIPAP at HS.  Needs to use BIPAP at HS, discussed with patient.   Acute on chronic systolic heart failure exacerbation;  Presents with hypoxemia, BNP elevated, chest x ray; cardiomegaly, mild vascular congestion , interstitial edema.  Continue with IV lasix  , metolazone.  Appreciate cardiology and nephrology  assistance.  Treated with IV lasix and metolazone. Cr trending down.  He was transition to oral lasix. On metolazone.   Acute on chronic renal failure stage II  Suspect related to heart failure. Cr baseline 1.2.  Continue with IV lasix.  Nephrology consulted per wife request.  Urine out put 3.2  l yesterday, cr trending down.  Protein electrophoresis ordered elevated light and kappa . Oncology following.  Cr today 1.4---peak to 1.9 this admission.  Cr trending down. Nephrology sign off.   Acute metabolic encephalopathy;  Notice to be more lethargic and confuse 9-26.  ABG PCO at 74,  Ph 7.2.  Started BIPAP. Improved on BIPAP> Discontinue schedule tramadol.  Hold Remeron.  Ammonia mildly elevated at 42. Started on lactulose. Ammonia normalized.  Alert, conversant.   A fib; not on anticoagulation due to GI bleed history. Biventricular pacer placement  Pancytopenia;  3 months ago he had low WBC, and low hb. Platelet count also mildly low.  B12 level; 861. Peripheral smear ordered. LDH 313 Dr Marin Olp consulted. Underwent  bone marrow biopsy 9-30 erythropoietin ordered.  Trending down, await Bone marrow results and follow  Dr Marin Olp recommendations.   Rash; -Petechial rash  Patient was recently started on Doxy for rash - unclear if that was for current petechial rash or if the current rash developed subsequent to starting doxy Holding doxy.  Monitor.   BPH; Continue Avodart and Flomax  OSA on CPAP Need to make sure patient use BIPAP at night. Discussed with nursing staff,   Hypokalemia resolved. On oral supplement. Monitor.   DVT prophylaxis; hold Lovenox, platelet continue to decreased. Start SCD.  Code Status: full code.  Family Communication: wife at bedside 9-26. Care discussed with patient 9-30 Disposition Plan: transition   Consultants:   Cardiology  Pulmonology  Nephrology    Procedures:   Renal US   Antimicrobials:   none   Subjective: Alert, complaining of shoulder pain.  Breathing better. I counsel him regarding using BIPAP   Objective: Vitals:   05/08/18 1500 05/08/18 2013 05/08/18 2355 05/09/18 0649  BP: 136/61 109/82 98/62 (!) 96/59  Pulse: 100 84 70 72  Resp: (!) 41 16  15  Temp:  98.1 F (36.7 C) 97.9 F (36.6 C) 98.1 F (36.7 C)  TempSrc:  Oral Oral Oral  SpO2: 100% 99% 96% 90%  Weight:    80 kg  Height:        Intake/Output Summary (Last 24 hours) at 05/09/2018 0813 Last data filed at 05/09/2018 0651 Gross per 24 hour  Intake 120 ml  Output 2000 ml  Net -1880 ml   Filed Weights   05/06/18 0328 05/08/18 0347 05/09/18 0649  Weight: 88 kg 87.5 kg 80 kg    Examination:  General exam; NAD Respiratory system: CTA Cardiovascular system: S 1, S 2 RRR Gastrointestinal system; BS present, soft, nt Central nervous system: alert, sitting in bed, non focal.  Extremities: petechia rash     Data Reviewed: I have personally reviewed following labs and imaging studies  CBC: Recent Labs  Lab 05/04/18 0011 05/05/18 0416 05/06/18 0554 05/07/18 0400 05/08/18 0409 05/09/18 0350  WBC 3.4* 3.8* 3.8* 2.6* 3.3* 2.7*  NEUTROABS 2.3  --  2.9 1.6* 2.0  1.4*  HGB 8.7* 8.1* 9.0* 8.3* 8.5* 8.4*  HCT 28.4* 26.0* 28.7* 25.8* 27.0* 26.8*  MCV 116.4* 117.6* 115.3* 113.7* 115.9* 115.0*  PLT 113* 106* 98* 91* 109* 96*   Basic Metabolic Panel: Recent Labs  Lab 05/05/18 0416 05/06/18 0554 05/07/18 0400 05/08/18 0409 05/09/18 0350  NA 140 140 142 143 138  K 3.6 3.2* 3.6 4.2 4.5  CL 98 93* 93* 92* 89*  CO2 34* 38* 43* 44* 44*  GLUCOSE 88 93 94 97 98  BUN 74* 72* 69* 63* 60*  CREATININE 1.95* 1.61* 1.47* 1.32* 1.34*  CALCIUM 8.8* 8.9 8.9 9.4 9.0  PHOS 4.4 3.3 2.8 2.9 3.5   GFR: Estimated Creatinine Clearance: 41.6 mL/min (A) (by C-G formula based on SCr of 1.34 mg/dL (H)). Liver Function Tests: Recent Labs  Lab 05/05/18 0749 05/06/18 0554 05/07/18 0400 05/08/18 0409  05/09/18 0350  AST 27  --   --   --   --   ALT 35  --   --   --   --   ALKPHOS 74  --   --   --   --   BILITOT 1.3*  --   --   --   --   PROT 6.4*  --   --   --   --   ALBUMIN 2.9* 3.2* 3.0* 3.1* 2.9*   No results for input(s): LIPASE, AMYLASE in the last 168 hours. Recent Labs  Lab 05/04/18 1255 05/05/18 1110  AMMONIA 42* 19   Coagulation Profile: Recent Labs  Lab 05/08/18 0409  INR 1.31   Cardiac Enzymes: No results for input(s): CKTOTAL, CKMB, CKMBINDEX, TROPONINI in the last 168 hours. BNP (last 3 results) No results for input(s): PROBNP in the last 8760 hours. HbA1C: No results for input(s): HGBA1C in the last 72 hours. CBG: Recent Labs  Lab 05/04/18 1207  GLUCAP 139*   Lipid Profile: No results for input(s): CHOL, HDL, LDLCALC, TRIG, CHOLHDL, LDLDIRECT in the last 72 hours. Thyroid Function Tests: No results for input(s): TSH, T4TOTAL, FREET4, T3FREE, THYROIDAB in the last 72 hours. Anemia Panel: No results for input(s): VITAMINB12, FOLATE, FERRITIN, TIBC, IRON, RETICCTPCT in the last 72 hours. Sepsis Labs: No results for input(s): PROCALCITON, LATICACIDVEN in the last 168 hours.  Recent Results (from the past 240 hour(s))  MRSA PCR  Screening     Status: None   Collection Time: 05/04/18  4:20 PM  Result Value Ref Range Status   MRSA by PCR NEGATIVE NEGATIVE Final    Comment:        The GeneXpert MRSA Assay (FDA approved for NASAL specimens only), is one component of a comprehensive MRSA colonization surveillance program. It is not intended to diagnose MRSA infection nor to guide or monitor treatment for MRSA infections. Performed at [facility], [place] [address], [place], [place] [address]          Radiology Studies: Ct Bone Marrow Biopsy & Aspiration  Result Date: 05/08/2018 INDICATION: 84 year old male with a history of anemia EXAM: CT BONE MARROW BIOPSY AND ASPIRATION MEDICATIONS: None. ANESTHESIA/SEDATION: Moderate (conscious) sedation was employed during this procedure. A total of Versed 1.0 mg and Fentanyl 50 mcg was administered intravenously. Moderate Sedation Time: 10 minutes. The patient's level of consciousness and vital signs were monitored continuously by radiology nursing throughout the procedure under my direct supervision. FLUOROSCOPY TIME:  CT COMPLICATIONS: None PROCEDURE: The procedure risks, benefits, and alternatives were explained to the patient. Questions regarding the procedure were encouraged and answered. The patient understands and consents to the procedure. Scout CT of the pelvis was performed for surgical planning purposes. The posterior pelvis was prepped with Chlorhexidine in a sterile fashion, and a sterile drape was applied covering the operative field. A sterile gown and sterile gloves were used for the procedure. Local anesthesia was provided with 1% Lidocaine. Posterior iliac bone was targeted for biopsy. The skin and subcutaneous tissues were infiltrated with 1% lidocaine without epinephrine. A small stab incision was made with an 11 blade scalpel, and an 11 gauge Murphy needle was advanced with CT guidance to the posterior cortex. Manual forced was used to advance the needle  through the posterior cortex and the stylet was removed. A bone marrow aspirate was retrieved and passed to a cytotechnologist in the room. The Murphy needle was then advanced without the stylet for a  core biopsy. The core biopsy was retrieved and also passed to a cytotechnologist. Manual pressure was used for hemostasis and a sterile dressing was placed. No complications were encountered no significant blood loss was encountered. Patient tolerated the procedure well and remained hemodynamically stable throughout. IMPRESSION: Status post CT-guided bone marrow biopsy, with tissue specimen sent to pathology for complete histopathologic analysis Signed, Dulcy Fanny. Earleen Newport, DO [facility] Radiology Electronically Signed   By: Corrie Mckusick D.O.   On: 05/08/2018 11:16        Scheduled Meds: . allopurinol  200 mg Oral Daily  . aspirin EC  81 mg Oral Daily  . dutasteride  0.5 mg Oral Daily  . escitalopram  5 mg Oral QHS  . furosemide  80 mg Oral BID  . ipratropium-albuterol  3 mL Nebulization TID  . lactulose  20 g Oral Daily  . metolazone  2.5 mg Oral Daily  . multivitamin  1 tablet Oral Daily  . pantoprazole  40 mg Oral Daily  . potassium chloride  40 mEq Oral BID  . rOPINIRole  4 mg Oral QHS  . sodium chloride flush  3 mL Intravenous Q12H  . tamsulosin  0.4 mg Oral QPC breakfast   Continuous Infusions: . sodium chloride    . ferric gluconate (FERRLECIT/NULECIT) IV 125 mg (05/08/18 1055)     LOS: 6 days    Time spent: 35 minutes.     Elmarie Shiley, MD [facility] Pager [phone]  If 7PM-7AM, please contact night-coverage [email or web address] Password [email or web address] 05/09/2018, 8:13 AM

## 2018-05-09 NOTE — Progress Notes (Addendum)
Progress Note  Patient Name: Anthony ALLINSON Sr. Date of Encounter: 05/09/2018  Primary Cardiologist: [name], MD   Subjective   In a much better mood than yesterday. He reports feeling improvement in his breathing today. Denies chest pain.   Inpatient Medications    Scheduled Meds: . allopurinol  200 mg Oral Daily  . aspirin EC  81 mg Oral Daily  . dutasteride  0.5 mg Oral Daily  . escitalopram  5 mg Oral QHS  . furosemide  80 mg Oral BID  . ipratropium-albuterol  3 mL Nebulization TID  . lactulose  20 g Oral Daily  . metolazone  2.5 mg Oral Daily  . multivitamin  1 tablet Oral Daily  . pantoprazole  40 mg Oral Daily  . potassium chloride  40 mEq Oral BID  . rOPINIRole  4 mg Oral QHS  . sodium chloride flush  3 mL Intravenous Q12H  . tamsulosin  0.4 mg Oral QPC breakfast   Continuous Infusions: . sodium chloride    . ferric gluconate (FERRLECIT/NULECIT) IV 125 mg (05/08/18 1055)   PRN Meds: sodium chloride, acetaminophen, diphenhydrAMINE-zinc acetate, hydrOXYzine, ipratropium-albuterol, ondansetron (ZOFRAN) IV, sodium chloride flush   Vital Signs    Vitals:   05/08/18 2355 05/09/18 0649 05/09/18 0817 05/09/18 0830  BP: 98/62 (!) 96/59 104/67 104/67  Pulse: 70 72 72 82  Resp:  15 (!) 21 (!) 23  Temp: 97.9 F (36.6 C) 98.1 F (36.7 C) 98.3 F (36.8 C)   TempSrc: Oral Oral Oral   SpO2: 96% 90% 93% 92%  Weight:  80 kg    Height:        Intake/Output Summary (Last 24 hours) at 05/09/2018 0913 Last data filed at 05/09/2018 0651 Gross per 24 hour  Intake 120 ml  Output 2000 ml  Net -1880 ml   Filed Weights   05/06/18 0328 05/08/18 0347 05/09/18 0649  Weight: 88 kg 87.5 kg 80 kg    Telemetry    Paced rhythm - Personally Reviewed  Physical Exam   GEN: No acute distress.   Neck: No JVD, no carotid bruits Cardiac: RRR, +murmur, no rubs or gallops.  Respiratory: Clear to auscultation bilaterally, no wheezes/ rales/ rhonchi GI: NABS, Soft,  nontender, non-distended  MS: No edema; No deformity. Neuro:  Nonfocal, moving all extremities spontaneously Psych: Normal affect   Labs    Chemistry Recent Labs  Lab 05/05/18 0749  05/07/18 0400 05/08/18 0409 05/09/18 0350  NA  --    < > 142 143 138  K  --    < > 3.6 4.2 4.5  CL  --    < > 93* 92* 89*  CO2  --    < > 43* 44* 44*  GLUCOSE  --    < > 94 97 98  BUN  --    < > 69* 63* 60*  CREATININE  --    < > 1.47* 1.32* 1.34*  CALCIUM  --    < > 8.9 9.4 9.0  PROT 6.4*  --   --   --   --   ALBUMIN 2.9*   < > 3.0* 3.1* 2.9*  AST 27  --   --   --   --   ALT 35  --   --   --   --   ALKPHOS 74  --   --   --   --   BILITOT 1.3*  --   --   --   --  GFRNONAA  --    < > 42* 48* 47*  GFRAA  --    < > 49* 55* 54*  ANIONGAP  --    < > _0 < > = values in this interval not displayed.     Hematology Recent Labs  Lab 05/07/18 0400 05/08/18 0409 05/09/18 0350  WBC 2.6* 3.3* 2.7*  RBC 2.27* 2.33* 2.33*  HGB 8.3* 8.5* 8.4*  HCT 25.8* 27.0* 26.8*  MCV 113.7* 115.9* 115.0*  MCH 36.6* 36.5* 36.1*  MCHC 32.2 31.5 31.3  RDW 17.1* 17.4* 17.5*  PLT 91* 109* 96*    Cardiac EnzymesNo results for input(s): TROPONINI in the last 168 hours.  Recent Labs  Lab 05/03/18 0412  TROPIPOC 0.03     BNP Recent Labs  Lab 05/03/18 0406  BNP 1,865.0*     DDimer No results for input(s): DDIMER in the last 168 hours.   Radiology    Ct Bone Marrow Biopsy & Aspiration  Result Date: 05/08/2018 INDICATION: 84 year old male with a history of anemia EXAM: CT BONE MARROW BIOPSY AND ASPIRATION MEDICATIONS: None. ANESTHESIA/SEDATION: Moderate (conscious) sedation was employed during this procedure. A total of Versed 1.0 mg and Fentanyl 50 mcg was administered intravenously. Moderate Sedation Time: 10 minutes. The patient's level of consciousness and vital signs were monitored continuously by radiology nursing throughout the procedure under my direct supervision. FLUOROSCOPY TIME:  CT  COMPLICATIONS: None PROCEDURE: The procedure risks, benefits, and alternatives were explained to the patient. Questions regarding the procedure were encouraged and answered. The patient understands and consents to the procedure. Scout CT of the pelvis was performed for surgical planning purposes. The posterior pelvis was prepped with Chlorhexidine in a sterile fashion, and a sterile drape was applied covering the operative field. A sterile gown and sterile gloves were used for the procedure. Local anesthesia was provided with 1% Lidocaine. Posterior iliac bone was targeted for biopsy. The skin and subcutaneous tissues were infiltrated with 1% lidocaine without epinephrine. A small stab incision was made with an 11 blade scalpel, and an 11 gauge Murphy needle was advanced with CT guidance to the posterior cortex. Manual forced was used to advance the needle through the posterior cortex and the stylet was removed. A bone marrow aspirate was retrieved and passed to a cytotechnologist in the room. The Murphy needle was then advanced without the stylet for a core biopsy. The core biopsy was retrieved and also passed to a cytotechnologist. Manual pressure was used for hemostasis and a sterile dressing was placed. No complications were encountered no significant blood loss was encountered. Patient tolerated the procedure well and remained hemodynamically stable throughout. IMPRESSION: Status post CT-guided bone marrow biopsy, with tissue specimen sent to pathology for complete histopathologic analysis Signed, Dulcy Fanny. Earleen Newport, DO [facility] Electronically Signed   By: Corrie Mckusick D.O.   On: 05/08/2018 11:16    Cardiac Studies   Echocardiogram 01/2018: Study Conclusions  - Left ventricle: The cavity size was normal. Systolic function was severely reduced. The estimated ejection fraction was in the range of 25% to 30%. There is akinesis of  the mid-apicalanteroseptal and inferoseptal myocardium. The study is not technically sufficient to allow evaluation of LV diastolic function. - Aortic valve: Trileaflet; moderately thickened, moderately calcified leaflets. Transvalvular velocity was minimally increased. There was no stenosis. There was mild regurgitation. Peak velocity (S): 233 cm/s. Valve area (VTI): 1.49 cm^2. Valve area (Vmax): 1.6 cm^2. Valve area (Vmean):  1.44 cm^2. - Mitral valve: There was mild regurgitation. - Left atrium: The atrium was severely dilated. - Right ventricle: The cavity size was moderately dilated. Wall thickness was normal. Pacer wire or catheter noted in right ventricle. - Right atrium: The atrium was severely dilated. - Tricuspid valve: There was moderate regurgitation. - Pulmonary arteries: Systolic pressure was moderately increased. PA peak pressure: 53 mm Hg (S  Patient Profile     84 y.o.malechronic systolic heart failurewith an EF of 25%, nonischemic cardiomyopathy(per cath 2010), pulmonary hypertension,RLS,OSA on CPAP, severe tricuspid regurgitation, permanent atrial fibrillation with slow ventricular responsenot on anticoagulation, single-chamber pacemakerin situ withrecent upgrade to CRT-P on 03/29/2018 per Dr. [name] and hypertensionwho is being followed by cardiology for the evaluation ofacuteCHF exacerbation.  Assessment & Plan    1. Acute on chronic combined CHF/non-ischemic cardiomyopathy: patient presented with SOB, LE edema, and hypoxia. He was diuresed with IV lasix and metolazone. He was transitioned from IV to home po lasix 05/07/18. Continues to maintain a net negative fluid balance with -1.8L in the past 24 hours and -11.8L this admission. Weight likely not accurate today as reported 16.5lb drop from yesterday. Still with some hypotension but overall stable.  - Continue po lasix 45m BID - Will stop metolazone at this time. - Continue to monitor  strict I&Os and daily weights  2. Permanent atrial fibrillation: paced rhythm. Not on anticoagulation given history of bleeding issues. - Continue routine outpt monitoring of PPM function  3. CKD stage 3: Cr stable at 1.34 today. - Continue close monitoring with ongoing diuresis  4. COPD: on home O2 - Continue management per primary team  5. Pancytopenia and petechial rash: oncology following. Underwent bone marrow biopsy yesterday - anticipate results Wednesday - Continue management per primary team/oncology   [facility] will sign off.   Medication Recommendations:  Lasix 859mBID Other recommendations (labs, testing, etc):  BMET at follow-up visit to monitor Cr. Follow up as an outpatient:  HaAlmyra DeforestPA-C 05/19/18 at 10:00am. AVS updated  For questions or updates, please contact CHMarthalease consult [email or web address] for contact info under Cardiology/STEMI.      Signed, KrAbigail ButtsPA-C  05/09/2018, 9:13 AM   [phone] examined chart reviewed CHF improved on oral lasix agree with d/c zaroxyln lungs clear with COPD Cr Stable on current dose Outpatient f/u arranged will sign off  PeJenkins Rouge

## 2018-05-09 NOTE — Progress Notes (Signed)
Occupational Therapy Treatment Patient Details Name: Anthony WECKWERTH Sr. MRN: [id number] DOB: Feb 11, 1932 Today's Date: 05/09/2018    History of present illness 84 y.o. male admitted with CHF exacerbation. PMHx: CHF with EF of 25%, NICM, pulmonary HTN, RLS, OSA on CPAP, severe tricuspid regurgitation, permanent Afib with slow ventricular response, pacemaker   OT comments  Patient was seen for skilled OT to increase I and safety with ADLs and mobility. Patient was Mod I with supine to sit. Patient was Min A with sit to stand from bed and stand pivot to commode. Patient was S with grooming tasks. Patient was min A with LE dressing sitting EIB then standing. Patient refused to sit up in dhair and requested to lay back in bed.   Follow Up Recommendations       Equipment Recommendations       Recommendations for Other Services      Precautions / Restrictions Precautions Precautions: Fall Precaution Comments: monitor O2 sats Restrictions Weight Bearing Restrictions: No       Mobility Bed Mobility Overal bed mobility: Modified Independent                Transfers       Sit to Stand: Min assist         General transfer comment: cues for safety    Balance                                           ADL either performed or assessed with clinical judgement   ADL       Grooming: Wash/dry hands;Wash/dry face;Supervision/safety;Set up;Standing               Lower Body Dressing: Minimal assistance;Sit to/from stand   Toilet Transfer: Minimal Patent examiner Details (indicate cue type and reason): Min guard assist Toileting- Clothing Manipulation and Hygiene: Minimal assistance         General ADL Comments: Pt. requires extra time.     Vision       Perception     Praxis      Cognition Arousal/Alertness: Awake/alert Behavior During Therapy: WFL for tasks assessed/performed Overall Cognitive Status: Within Functional  Limits for tasks assessed                                          Exercises     Shoulder Instructions       General Comments      Pertinent Vitals/ Pain       Pain Assessment: 0-10 Pain Score: 3  Pain Location: b legs, patient states it is from RLS Pain Descriptors / Indicators: Aching Pain Intervention(s): Limited activity within patient's tolerance;Monitored during session;Premedicated before session  Home Living                                          Prior Functioning/Environment              Frequency           Progress Toward Goals  OT Goals(current goals can now be found in the care plan section)  Progress towards OT goals: Progressing toward goals  Acute Rehab OT Goals Patient  Stated Goal: go home  Plan      Co-evaluation                 AM-PAC PT "6 Clicks" Daily Activity     Outcome Measure   Help from another person eating meals?: None Help from another person taking care of personal grooming?: A Little Help from another person toileting, which includes using toliet, bedpan, or urinal?: A Little Help from another person bathing (including washing, rinsing, drying)?: A Little Help from another person to put on and taking off regular upper body clothing?: A Little Help from another person to put on and taking off regular lower body clothing?: A Little 6 Click Score: 19    End of Session Equipment Utilized During Treatment: Gait belt;Rolling walker      Activity Tolerance Patient tolerated treatment well   Patient Left in bed;with call bell/phone within reach;with bed alarm set(Pt. did not want to sit in the chair.)   Nurse Communication (ok therapy)        Time: 1000-1034 OT Time Calculation (min): 34 min  Charges: OT General Charges $OT Visit: 1 Visit OT Treatments $Self Care/Home Management : 13-14 mins  6 clicks  [name] 05/09/2018, 10:35 AM

## 2018-05-09 NOTE — Evaluation (Signed)
Physical Therapy Evaluation Patient Details Name: Anthony TOOMEY Sr. MRN: [id number] DOB: [birth year] Today's Date: 05/09/2018   History of Present Illness  84 y.o. male admitted with CHF exacerbation. PMHx: CHF with EF of 25%, NICM, pulmonary HTN, RLS, OSA on CPAP, severe tricuspid regurgitation, permanent Afib with slow ventricular response, pacemaker  Clinical Impression  PTA pt lived with and was primary caregiver for wife in single story home with ramped entrance. Pt was independent in limited community ambulation with RW or cane, driving and independent in iADLs. Pt currently is limited in safe mobility by oxygen desaturation (see General Comments), as well as decreased strength and endurance. Pt requires min-mod A for transfers and min A for ambulation of 18 feet. PT recommends SNF level rehab at discharge. PT spoke with daughter on phone during session and she is interested in [facility] facility. PT will continue to follow acutely.     Follow Up Recommendations SNF    Equipment Recommendations  None recommended by PT    Recommendations for Other Services       Precautions / Restrictions Precautions Precautions: Fall Precaution Comments: monitor O2 sats Restrictions Weight Bearing Restrictions: No      Mobility  Bed Mobility Overal bed mobility: Modified Independent                Transfers Overall transfer level: Needs assistance Equipment used: Rolling walker (2 wheeled) Transfers: Sit to/from Stand Sit to Stand: Min assist;Mod assist         General transfer comment: min A for power up and steadying from bed,  vc for hand placement for power up mod A for powerup from low commode in bathroom   Ambulation/Gait Ambulation/Gait assistance: Min assist Gait Distance (Feet): 18 Feet Assistive device: Rolling walker (2 wheeled) Gait Pattern/deviations: Step-through pattern;Decreased stride length;Trunk flexed Gait velocity: slowed Gait velocity  interpretation: <1.31 ft/sec, indicative of household ambulator General Gait Details: minA for steadying in RW, vc for upright posture and proximity to RW,         Balance Overall balance assessment: Needs assistance Sitting-balance support: No upper extremity supported;Feet supported Sitting balance-Leahy Scale: Good     Standing balance support: Single extremity supported Standing balance-Leahy Scale: Poor                               Pertinent Vitals/Pain Pain Assessment: 0-10 Pain Score: 4  Pain Location: B [place] Descriptors / Indicators: Anton Ruiz expects to be discharged to:: Private residence Living Arrangements: Spouse/significant other Available Help at Discharge: Family;Available PRN/intermittently Type of Home: House Home Access: Ramped entrance     Home Layout: One level Home Equipment: Walker - 2 wheels;Cane - single point;Electric scooter;Crutches;Tub bench;Bedside commode Additional Comments: 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 Children/grandchildren live nearby and assist PRN; children help care for pt's wife as well    Prior Function Level of Independence: Independent with assistive device(s)         Comments: primary care giver for wife, use of RW to reduce falls     Hand Dominance   Dominant Hand: Right    Extremity/Trunk Assessment   Upper Extremity Assessment Upper Extremity Assessment: Defer to OT evaluation    Lower Extremity Assessment Lower Extremity Assessment: Generalized weakness       Communication   Communication: No difficulties  Cognition Arousal/Alertness: Awake/alert Behavior During Therapy: WFL for tasks assessed/performed Overall Cognitive Status: Within  Functional Limits for tasks assessed                                        General Comments General comments (skin integrity, edema, etc.): Pt on 4L O2 via [place], SaO2 at rest 96%O2 with ambulation SaO2 dropped to  85%O2, with sitting and vc for pursed lipped breathing SaO2 rebounded to 90%O2, VSS        Assessment/Plan    PT Assessment Patient needs continued PT services  PT Problem List Decreased strength;Decreased mobility;Decreased safety awareness;Decreased activity tolerance;Decreased balance;Decreased knowledge of use of DME;Decreased cognition;Cardiopulmonary status limiting activity;Decreased knowledge of precautions       PT Treatment Interventions DME instruction;Gait training;Functional mobility training;Therapeutic activities;Therapeutic exercise;Balance training;Cognitive remediation;Patient/family education    PT Goals (Current goals can be found in the Care Plan section)  Acute Rehab PT Goals Patient Stated Goal: go home PT Goal Formulation: With patient Time For Goal Achievement: 05/23/18 Potential to Achieve Goals: Fair    Frequency Min 2X/week   Barriers to discharge Decreased caregiver support         AM-PAC PT "6 Clicks" Daily Activity  Outcome Measure Difficulty turning over in bed (including adjusting bedclothes, sheets and blankets)?: A Little Difficulty moving from lying on back to sitting on the side of the bed? : Unable Difficulty sitting down on and standing up from a chair with arms (e.g., wheelchair, bedside commode, etc,.)?: Unable Help needed moving to and from a bed to chair (including a wheelchair)?: A Little Help needed walking in hospital room?: A Little Help needed climbing 3-5 steps with a railing? : A Lot 6 Click Score: 13    End of Session Equipment Utilized During Treatment: Gait belt Activity Tolerance: Patient limited by fatigue Patient left: in chair;with call bell/phone within reach;with chair alarm set Nurse Communication: Mobility status PT Visit Diagnosis: Other abnormalities of gait and mobility (R26.89);Muscle weakness (generalized) (M62.81);History of falling (Z91.81);Unsteadiness on feet (R26.81)    Time: 1345-1415 PT Time  Calculation (min) (ACUTE ONLY): 30 min   Charges:   PT Evaluation $PT Eval Moderate Complexity: 1 Mod PT Treatments $Gait Training: 8-22 mins        Silvina Hackleman B. Migdalia Dk PT, DPT Acute Rehabilitation Services Pager [phone] Office [phone]   [place] 05/09/2018, 3:38 PM

## 2018-05-10 DIAGNOSIS — R233 Spontaneous ecchymoses: Secondary | ICD-10-CM

## 2018-05-10 DIAGNOSIS — N171 Acute kidney failure with acute cortical necrosis: Secondary | ICD-10-CM

## 2018-05-10 DIAGNOSIS — G4733 Obstructive sleep apnea (adult) (pediatric): Secondary | ICD-10-CM

## 2018-05-10 DIAGNOSIS — Z9989 Dependence on other enabling machines and devices: Secondary | ICD-10-CM

## 2018-05-10 DIAGNOSIS — I482 Chronic atrial fibrillation, unspecified: Secondary | ICD-10-CM

## 2018-05-10 LAB — RENAL FUNCTION PANEL
ANION GAP: 5 (ref 5–15)
Albumin: 2.9 g/dL — ABNORMAL LOW (ref 3.5–5.0)
BUN: 57 mg/dL — ABNORMAL HIGH (ref 8–23)
CHLORIDE: 90 mmol/L — AB (ref 98–111)
CO2: 43 mmol/L — AB (ref 22–32)
Calcium: 9 mg/dL (ref 8.9–10.3)
Creatinine, Ser: 1.38 mg/dL — ABNORMAL HIGH (ref 0.61–1.24)
GFR calc non Af Amer: 45 mL/min — ABNORMAL LOW (ref >60)
GFR, EST AFRICAN AMERICAN: 53 mL/min — AB (ref >60)
GLUCOSE: 110 mg/dL — AB (ref 70–99)
Phosphorus: 4.4 mg/dL (ref 2.5–4.6)
Potassium: 4.4 mmol/L (ref 3.5–5.1)
SODIUM: 138 mmol/L (ref 135–145)

## 2018-05-10 LAB — PREPARE RBC (CROSSMATCH)

## 2018-05-10 MED ORDER — FUROSEMIDE 10 MG/ML IJ SOLN
20.0000 mg | Freq: Once | INTRAMUSCULAR | Status: AC
  Administered 2018-05-10: 20 mg via INTRAVENOUS
  Filled 2018-05-10: qty 2

## 2018-05-10 MED ORDER — SODIUM CHLORIDE 0.9% IV SOLUTION
Freq: Once | INTRAVENOUS | Status: AC
  Administered 2018-05-10: 12:00:00 via INTRAVENOUS

## 2018-05-10 MED ORDER — FUROSEMIDE 10 MG/ML IJ SOLN: 20.0000 mg | Freq: Once | INTRAMUSCULAR | Status: DC

## 2018-05-10 MED ORDER — IPRATROPIUM-ALBUTEROL 0.5-2.5 (3) MG/3ML IN SOLN
3.0000 mL | Freq: Two times a day (BID) | RESPIRATORY_TRACT | Status: DC
  Administered 2018-05-10 – 2018-05-11 (×2): 3 mL via RESPIRATORY_TRACT
  Filled 2018-05-10 (×2): qty 3

## 2018-05-10 MED ORDER — DARBEPOETIN ALFA 300 MCG/0.6ML IJ SOSY
300.0000 ug | PREFILLED_SYRINGE | Freq: Once | INTRAMUSCULAR | Status: AC
  Administered 2018-05-10: 300 ug via SUBCUTANEOUS
  Filled 2018-05-10: qty 0.6

## 2018-05-10 NOTE — Plan of Care (Signed)
  Problem: Education: Goal: Knowledge of General Education information will improve Description Including pain rating scale, medication(s)/side effects and non-pharmacologic comfort measures Outcome: Progressing Note:  POC reviewed with pt.   

## 2018-05-10 NOTE — Progress Notes (Signed)
Mr. Polinsky is about the same.  The bone marrow report came back.  It is very consistent with myelodysplasia.  This would go along with the markedly elevated MCV.  I do not have back the cytogenetics.  He will need a blood transfusion.  I do not think that he is going to be able to make blood on himself all that well.  His hemoglobin is 8.4.  I think given his age and other health issues, blood will help.  He also will get some Aranesp.  His erythropoietin level is 130.  As if he would qualify for Aranesp.  He has gotten IV iron.  As such, I would like to think that the iron will help make the Aranesp work better.  It is hard to say how much she really understands about what is going on.  He is not a candidate for any systemic therapy for this myelodysplasia.  He has a poor performance status and as such, I think our best chances for him is to try to maintain his blood counts with transfusions and with kinetic factors.  He does have an element of renal insufficiency so I would like to think that the Aranesp would be appropriate for him.  He has had no fever.  His vital signs look okay.  His blood pressure is little bit low at 95/61.  There is been no emesis.  He has had no diarrhea.  Again, I think that myelodysplasia clearly explains the pancytopenia.  I know that there is iron deficiency.  However, I think that the fact that he has myelodysplasia is going to be a chronic issue for him.  Everything will be driven by his performance status.  I would have to say that his performance status is ECOG 3.  It is hard to say when he will go home.  However, he clearly needs to be transfused today.  This will help his heart and this will help oxygen delivery.  [name], MD  [facility] 6:9

## 2018-05-10 NOTE — Consult Note (Addendum)
   [facility] CM Inpatient Consult   05/10/2018  Anthony MAFFEI Sr. 12-01-1931 [id number]    Patient screened for potential [facility] Care Management services due to unplanned readmission risk score of 23% (high) and multiple hospitalizations.   Chart reviewed. Noted discharge plan may be SNF. Noted palliative care consult pending.   Will continue to follow along for disposition plans and progression and engage for [facility] if appropriate.   Marthenia Rolling, MSN-Ed, RN,BSN [facility] Liaison [phone]

## 2018-05-10 NOTE — Progress Notes (Signed)
No charge note:   Palliative consult received- [place] meeting arranged for tomorrow 10/3 at 10 am.   Mariana Kaufman, AGNP-C Palliative Medicine  Please call Palliative Medicine team phone with any questions [phone]. For individual providers please see AMION.

## 2018-05-10 NOTE — Progress Notes (Signed)
PROGRESS NOTE    [name]  IRJ:[id number] DOB: Apr 24, 1932 DOA: 05/03/2018 PCP: [name], MD   Brief Narrative:  Anthony Agar Sr. is a 84 y.o. male with medical history significant of RLS; pulmonary HTN; pacemaker placement; afib on Eliquis; OSA on CPAP; HTN; HLD; CAD; stage 3 CKD; and CHF (EF 25-30% in 6/19) presenting with SOB. He has had symptoms for a couple of days. He has been breaking out in a rash and itching all over, as well.  The rash has been present for a couple of days. +LE edema.  Weight increased from 179-191.  No orthopnea.  He wears 2L home O2. In the ED, sats noted to be in the 70s. Pt admitted for acute on chronic heart failure exacerbation and acute on chronic renal failure. He was treated with IV lasix and metolazone. Cardiology and nephrologist were assisting with his care. Patient became more lethargic on 9-26, blood gas showed hypercapnic respiratory failure. Patient was place on BIPAP. CCM consulted. Patient subseqlty improved and became more alert. Pt also noted to have pancytopenia. Dr Marin Olp was consulted.   Today, pt denies any new complaints, reports feeling better, but still noted to be itching all over. Denies any chest pain, worsening SOB, cough, fever/chills.  Assessment & Plan:   Principal Problem:   Acute on chronic respiratory failure with hypoxia (HCC) Active Problems:   OSA on CPAP   BPH (benign prostatic hyperplasia)   Chronic atrial fibrillation   Acute renal failure superimposed on stage 3 chronic kidney disease (HCC)   Biventricular cardiac pacemaker  st Judes  dual chamber with LV in atrial port    Acute on chronic systolic congestive heart failure (HCC)   Petechial rash   AKI (acute kidney injury) ([place])  Acute on chronic hypoxic and hypercapnic respiratory failure, secondary to CHF/pulm edema exacerbation on 3 L oxygen  Stable Presents with hypoxemia, BNP 1865 Patient noted to be more lethargic, confused on 9-26. Blood  gas showed PH 7.2 PCO2 74, po2 ; 78. Patient was transfered to SDU, CCM consulted. Improved on BIPAP, needs to use BIPAP at HS.  On oral lasix as noted below  Acute on chronic systolic heart failure exacerbation  Presents with hypoxemia, BNP elevated, chest x ray; cardiomegaly, mild vascular congestion , interstitial edema ECHO on 01/2018: EF of 25-30%, PulmHTN 53 mm Hg Continue with lasix, metolazone  Appreciate cardiology and nephrology  assistance  Strict I & O, daily weight  Acute on chronic renal failure stage II  Improving Suspect related to heart failure. Cr baseline 1.2- 1.5 Nephrology on board, signed off Avoid nephrotoxics  Permanent A fib Biventricular pacer placement Not on anticoagulation due to GI bleed history   Pancytopenia likely 2/2 myelodysplastic syndrome 3 months ago he had low WBC, and low hb. Platelet count also mildly low.  B12 level; 861. Peripheral smear ordered. LDH 313 Dr Marin Olp consulted, underwent  bone marrow biopsy 9-30, result suspicious for MDS. Dr Marin Olp ordered 2U of PRBC for transfusion today, aranesp Daily CBC  Rash Petechial rash  ??Doxy, continue to hold Benadryl, hydroxyzine prn  BPH Continue Avodart and Flomax  OSA on CPAP BIPAP at night  [facility] Palliative team consulted for [place] discussions, meeting scheduled 05/11/18 at 10 am    DVT prophylaxis: SCD Code Status: Full code  Family Communication: None at bedside Disposition Plan: SNF  Consultants:   Cardiology  Pulmonology  Nephrology    Procedures: None   Antimicrobials: None  Subjective: Today, pt denies any new complaints, reports feeling better, but still noted to be itching all over. Denies any chest pain, worsening SOB, cough, fever/chills.  Objective: Vitals:   05/10/18 0919 05/10/18 1111 05/10/18 1200 05/10/18 1440  BP: 96/62 (!) 96/57 (!) 106/55 100/60  Pulse: 76 72 73 70  Resp: 20  19   Temp:  97.8 F (36.6 C) 98 F (36.7 C) 97.6 F (36.4 C)    TempSrc:  Axillary Oral Oral  SpO2: 92% 94% 94% 90%  Weight:      Height:        Intake/Output Summary (Last 24 hours) at 05/10/2018 1618 Last data filed at 05/10/2018 1441 Gross per 24 hour  Intake 762 ml  Output 950 ml  Net -188 ml   Filed Weights   05/08/18 0347 05/09/18 0649 05/10/18 0400  Weight: 87.5 kg 80 kg 79.2 kg    Examination:  General exam; NAD, itching all over Respiratory system: CTAB Cardiovascular system: S1, S2 present Gastrointestinal system; Soft, NT, ND, BS present Extremities/Skin: Petechia rash     Data Reviewed: I have personally reviewed following labs and imaging studies  CBC: Recent Labs  Lab 05/04/18 0011 05/05/18 0416 05/06/18 0554 05/07/18 0400 05/08/18 0409 05/09/18 0350  WBC 3.4* 3.8* 3.8* 2.6* 3.3* 2.7*  NEUTROABS 2.3  --  2.9 1.6* 2.0 1.4*  HGB 8.7* 8.1* 9.0* 8.3* 8.5* 8.4*  HCT 28.4* 26.0* 28.7* 25.8* 27.0* 26.8*  MCV 116.4* 117.6* 115.3* 113.7* 115.9* 115.0*  PLT 113* 106* 98* 91* 109* 96*   Basic Metabolic Panel: Recent Labs  Lab 05/06/18 0554 05/07/18 0400 05/08/18 0409 05/09/18 0350 05/10/18 0408  NA 140 142 143 138 138  K 3.2* 3.6 4.2 4.5 4.4  CL 93* 93* 92* 89* 90*  CO2 38* 43* 44* 44* 43*  GLUCOSE 93 94 97 98 110*  BUN 72* 69* 63* 60* 57*  CREATININE 1.61* 1.47* 1.32* 1.34* 1.38*  CALCIUM 8.9 8.9 9.4 9.0 9.0  PHOS 3.3 2.8 2.9 3.5 4.4   GFR: Estimated Creatinine Clearance: 37.3 mL/min (A) (by C-G formula based on SCr of 1.38 mg/dL (H)). Liver Function Tests: Recent Labs  Lab 05/05/18 0749 05/06/18 0554 05/07/18 0400 05/08/18 0409 05/09/18 0350 05/10/18 0408  AST 27  --   --   --   --   --   ALT 35  --   --   --   --   --   ALKPHOS 74  --   --   --   --   --   BILITOT 1.3*  --   --   --   --   --   PROT 6.4*  --   --   --   --   --   ALBUMIN 2.9* 3.2* 3.0* 3.1* 2.9* 2.9*   No results for input(s): LIPASE, AMYLASE in the last 168 hours. Recent Labs  Lab 05/04/18 1255 05/05/18 1110  AMMONIA  42* 19   Coagulation Profile: Recent Labs  Lab 05/08/18 0409  INR 1.31   Cardiac Enzymes: No results for input(s): CKTOTAL, CKMB, CKMBINDEX, TROPONINI in the last 168 hours. BNP (last 3 results) No results for input(s): PROBNP in the last 8760 hours. HbA1C: No results for input(s): HGBA1C in the last 72 hours. CBG: Recent Labs  Lab 05/04/18 1207  GLUCAP 139*   Lipid Profile: No results for input(s): CHOL, HDL, LDLCALC, TRIG, CHOLHDL, LDLDIRECT in the last 72 hours. Thyroid Function Tests: No results for input(s):  TSH, T4TOTAL, FREET4, T3FREE, THYROIDAB in the last 72 hours. Anemia Panel: No results for input(s): VITAMINB12, FOLATE, FERRITIN, TIBC, IRON, RETICCTPCT in the last 72 hours. Sepsis Labs: No results for input(s): PROCALCITON, LATICACIDVEN in the last 168 hours.  Recent Results (from the past 240 hour(s))  MRSA PCR Screening     Status: None   Collection Time: 05/04/18  4:20 PM  Result Value Ref Range Status   MRSA by PCR NEGATIVE NEGATIVE Final    Comment:        The GeneXpert MRSA Assay (FDA approved for NASAL specimens only), is one component of a comprehensive MRSA colonization surveillance program. It is not intended to diagnose MRSA infection nor to guide or monitor treatment for MRSA infections. Performed at [facility], [place] [address] [place], [place] [address]          Radiology Studies: Dg Shoulder Right  Result Date: 05/09/2018 CLINICAL DATA:  Pain EXAM: RIGHT SHOULDER - 2+ VIEW COMPARISON:  04/05/2017 FINDINGS: Probable prior distal clavicle resection. Anchors noted in the humeral head. Advanced arthritic changes in the right glenohumeral joint with spurring, joint space loss and loss of subacromial space. No fracture, subluxation or dislocation. IMPRESSION: Advanced arthritic changes in the right shoulder, postoperative changes. No acute bony abnormality. Electronically Signed   By: Rolm Baptise M.D.   On: 05/09/2018 09:37         Scheduled Meds: . allopurinol  200 mg Oral Daily  . aspirin EC  81 mg Oral Daily  . dutasteride  0.5 mg Oral Daily  . escitalopram  5 mg Oral QHS  . furosemide  20 mg Intravenous Once  . furosemide  80 mg Oral BID  . ipratropium-albuterol  3 mL Nebulization BID  . lactulose  20 g Oral Daily  . multivitamin  1 tablet Oral Daily  . pantoprazole  40 mg Oral Daily  . potassium chloride  40 mEq Oral BID  . rOPINIRole  4 mg Oral QHS  . sodium chloride flush  3 mL Intravenous Q12H  . tamsulosin  0.4 mg Oral QPC breakfast   Continuous Infusions: . sodium chloride 10 mL/hr at 05/10/18 0852  . ferric gluconate (FERRLECIT/NULECIT) IV 125 mg (05/10/18 0854)     LOS: 7 days    Time spent: 35 minutes.     Alma Friendly, MD [facility]   If 7PM-7AM, please contact night-coverage [email or web address] 05/10/2018, 4:18 PM

## 2018-05-10 NOTE — Telephone Encounter (Signed)
currently admitted

## 2018-05-10 NOTE — Progress Notes (Signed)
Visit  Made to patients room to place on Bipap.  Pt states he dont want to wear at this time.

## 2018-05-11 ENCOUNTER — Other Ambulatory Visit: Payer: Self-pay | Admitting: Hematology & Oncology

## 2018-05-11 ENCOUNTER — Encounter: Payer: Self-pay | Admitting: Hematology & Oncology

## 2018-05-11 DIAGNOSIS — G2581 Restless legs syndrome: Secondary | ICD-10-CM | POA: Diagnosis not present

## 2018-05-11 DIAGNOSIS — N183 Chronic kidney disease, stage 3 (moderate): Secondary | ICD-10-CM | POA: Diagnosis not present

## 2018-05-11 DIAGNOSIS — N189 Chronic kidney disease, unspecified: Secondary | ICD-10-CM | POA: Diagnosis not present

## 2018-05-11 DIAGNOSIS — N179 Acute kidney failure, unspecified: Secondary | ICD-10-CM | POA: Diagnosis not present

## 2018-05-11 DIAGNOSIS — D46Z Other myelodysplastic syndromes: Secondary | ICD-10-CM | POA: Diagnosis not present

## 2018-05-11 DIAGNOSIS — D509 Iron deficiency anemia, unspecified: Secondary | ICD-10-CM | POA: Diagnosis not present

## 2018-05-11 DIAGNOSIS — Z7189 Other specified counseling: Secondary | ICD-10-CM

## 2018-05-11 DIAGNOSIS — Z950 Presence of cardiac pacemaker: Secondary | ICD-10-CM | POA: Diagnosis not present

## 2018-05-11 DIAGNOSIS — I5042 Chronic combined systolic (congestive) and diastolic (congestive) heart failure: Secondary | ICD-10-CM | POA: Diagnosis not present

## 2018-05-11 DIAGNOSIS — I5023 Acute on chronic systolic (congestive) heart failure: Secondary | ICD-10-CM | POA: Diagnosis not present

## 2018-05-11 DIAGNOSIS — R1312 Dysphagia, oropharyngeal phase: Secondary | ICD-10-CM | POA: Diagnosis not present

## 2018-05-11 DIAGNOSIS — I428 Other cardiomyopathies: Secondary | ICD-10-CM | POA: Diagnosis not present

## 2018-05-11 DIAGNOSIS — Z79899 Other long term (current) drug therapy: Secondary | ICD-10-CM | POA: Diagnosis not present

## 2018-05-11 DIAGNOSIS — Z7982 Long term (current) use of aspirin: Secondary | ICD-10-CM | POA: Diagnosis not present

## 2018-05-11 DIAGNOSIS — I2510 Atherosclerotic heart disease of native coronary artery without angina pectoris: Secondary | ICD-10-CM | POA: Diagnosis not present

## 2018-05-11 DIAGNOSIS — Z7401 Bed confinement status: Secondary | ICD-10-CM | POA: Diagnosis not present

## 2018-05-11 DIAGNOSIS — I480 Paroxysmal atrial fibrillation: Secondary | ICD-10-CM | POA: Diagnosis not present

## 2018-05-11 DIAGNOSIS — M6281 Muscle weakness (generalized): Secondary | ICD-10-CM | POA: Diagnosis not present

## 2018-05-11 DIAGNOSIS — E785 Hyperlipidemia, unspecified: Secondary | ICD-10-CM | POA: Diagnosis not present

## 2018-05-11 DIAGNOSIS — Z515 Encounter for palliative care: Secondary | ICD-10-CM

## 2018-05-11 DIAGNOSIS — I10 Essential (primary) hypertension: Secondary | ICD-10-CM | POA: Diagnosis not present

## 2018-05-11 DIAGNOSIS — F339 Major depressive disorder, recurrent, unspecified: Secondary | ICD-10-CM | POA: Diagnosis not present

## 2018-05-11 DIAGNOSIS — M1990 Unspecified osteoarthritis, unspecified site: Secondary | ICD-10-CM | POA: Diagnosis not present

## 2018-05-11 DIAGNOSIS — J9621 Acute and chronic respiratory failure with hypoxia: Secondary | ICD-10-CM | POA: Diagnosis not present

## 2018-05-11 DIAGNOSIS — Z7901 Long term (current) use of anticoagulants: Secondary | ICD-10-CM | POA: Diagnosis not present

## 2018-05-11 DIAGNOSIS — R262 Difficulty in walking, not elsewhere classified: Secondary | ICD-10-CM | POA: Diagnosis not present

## 2018-05-11 DIAGNOSIS — R5383 Other fatigue: Secondary | ICD-10-CM | POA: Diagnosis not present

## 2018-05-11 DIAGNOSIS — I482 Chronic atrial fibrillation, unspecified: Secondary | ICD-10-CM | POA: Diagnosis not present

## 2018-05-11 DIAGNOSIS — Z9989 Dependence on other enabling machines and devices: Secondary | ICD-10-CM | POA: Diagnosis not present

## 2018-05-11 DIAGNOSIS — Z23 Encounter for immunization: Secondary | ICD-10-CM | POA: Diagnosis not present

## 2018-05-11 DIAGNOSIS — I5022 Chronic systolic (congestive) heart failure: Secondary | ICD-10-CM | POA: Diagnosis not present

## 2018-05-11 DIAGNOSIS — M1A9XX0 Chronic gout, unspecified, without tophus (tophi): Secondary | ICD-10-CM | POA: Diagnosis not present

## 2018-05-11 DIAGNOSIS — I130 Hypertensive heart and chronic kidney disease with heart failure and stage 1 through stage 4 chronic kidney disease, or unspecified chronic kidney disease: Secondary | ICD-10-CM | POA: Diagnosis not present

## 2018-05-11 DIAGNOSIS — D469 Myelodysplastic syndrome, unspecified: Secondary | ICD-10-CM | POA: Diagnosis not present

## 2018-05-11 DIAGNOSIS — N400 Enlarged prostate without lower urinary tract symptoms: Secondary | ICD-10-CM | POA: Diagnosis not present

## 2018-05-11 DIAGNOSIS — Z8719 Personal history of other diseases of the digestive system: Secondary | ICD-10-CM | POA: Diagnosis not present

## 2018-05-11 DIAGNOSIS — R21 Rash and other nonspecific skin eruption: Secondary | ICD-10-CM | POA: Diagnosis not present

## 2018-05-11 DIAGNOSIS — Z9981 Dependence on supplemental oxygen: Secondary | ICD-10-CM | POA: Diagnosis not present

## 2018-05-11 DIAGNOSIS — J9622 Acute and chronic respiratory failure with hypercapnia: Secondary | ICD-10-CM | POA: Diagnosis not present

## 2018-05-11 DIAGNOSIS — J9611 Chronic respiratory failure with hypoxia: Secondary | ICD-10-CM | POA: Diagnosis not present

## 2018-05-11 DIAGNOSIS — J449 Chronic obstructive pulmonary disease, unspecified: Secondary | ICD-10-CM | POA: Diagnosis not present

## 2018-05-11 DIAGNOSIS — R233 Spontaneous ecchymoses: Secondary | ICD-10-CM | POA: Diagnosis not present

## 2018-05-11 DIAGNOSIS — M2550 Pain in unspecified joint: Secondary | ICD-10-CM | POA: Diagnosis not present

## 2018-05-11 DIAGNOSIS — G4733 Obstructive sleep apnea (adult) (pediatric): Secondary | ICD-10-CM | POA: Diagnosis not present

## 2018-05-11 HISTORY — DX: Other myelodysplastic syndromes: D46.Z

## 2018-05-11 LAB — RENAL FUNCTION PANEL
ALBUMIN: 3 g/dL — AB (ref 3.5–5.0)
Anion gap: 5 (ref 5–15)
BUN: 53 mg/dL — ABNORMAL HIGH (ref 8–23)
CALCIUM: 8.7 mg/dL — AB (ref 8.9–10.3)
CO2: 41 mmol/L — ABNORMAL HIGH (ref 22–32)
Chloride: 92 mmol/L — ABNORMAL LOW (ref 98–111)
Creatinine, Ser: 1.29 mg/dL — ABNORMAL HIGH (ref 0.61–1.24)
GFR calc Af Amer: 57 mL/min — ABNORMAL LOW (ref >60)
GFR, EST NON AFRICAN AMERICAN: 49 mL/min — AB (ref >60)
GLUCOSE: 94 mg/dL (ref 70–99)
Phosphorus: 4.1 mg/dL (ref 2.5–4.6)
Potassium: 4 mmol/L (ref 3.5–5.1)
SODIUM: 138 mmol/L (ref 135–145)

## 2018-05-11 LAB — BPAM RBC
BLOOD PRODUCT EXPIRATION DATE: 201910052359
Blood Product Expiration Date: 201910192359
ISSUE DATE / TIME: 201910021128
ISSUE DATE / TIME: 201910021749
UNIT TYPE AND RH: 600
Unit Type and Rh: 600

## 2018-05-11 LAB — CBC WITH DIFFERENTIAL/PLATELET
ABS IMMATURE GRANULOCYTES: 0 10*3/uL (ref 0.0–0.1)
BASOS PCT: 1 %
Basophils Absolute: 0 10*3/uL (ref 0.0–0.1)
Eosinophils Absolute: 0.1 10*3/uL (ref 0.0–0.7)
Eosinophils Relative: 3 %
HEMATOCRIT: 32.1 % — AB (ref 39.0–52.0)
Hemoglobin: 10.5 g/dL — ABNORMAL LOW (ref 13.0–17.0)
IMMATURE GRANULOCYTES: 0 %
LYMPHS ABS: 0.7 10*3/uL (ref 0.7–4.0)
Lymphocytes Relative: 25 %
MCH: 35.6 pg — AB (ref 26.0–34.0)
MCHC: 32.7 g/dL (ref 30.0–36.0)
MCV: 108.8 fL — AB (ref 78.0–100.0)
MONO ABS: 0.5 10*3/uL (ref 0.1–1.0)
MONOS PCT: 16 %
NEUTROS PCT: 55 %
Neutro Abs: 1.6 10*3/uL — ABNORMAL LOW (ref 1.7–7.7)
PLATELETS: 92 10*3/uL — AB (ref 150–400)
RBC: 2.95 MIL/uL — ABNORMAL LOW (ref 4.22–5.81)
RDW: 20.2 % — ABNORMAL HIGH (ref 11.5–15.5)
WBC: 2.9 10*3/uL — ABNORMAL LOW (ref 4.0–10.5)

## 2018-05-11 LAB — TYPE AND SCREEN
ABO/RH(D): A NEG
Antibody Screen: NEGATIVE
UNIT DIVISION: 0
Unit division: 0

## 2018-05-11 MED ORDER — ASPIRIN 81 MG PO TBEC: 81.0000 mg | DELAYED_RELEASE_TABLET | Freq: Every day | ORAL | Status: DC

## 2018-05-11 MED ORDER — IPRATROPIUM-ALBUTEROL 0.5-2.5 (3) MG/3ML IN SOLN: 3.0000 mL | RESPIRATORY_TRACT | Status: DC | PRN

## 2018-05-11 MED ORDER — HYDROXYZINE HCL 10 MG PO TABS: 10.0000 mg | ORAL_TABLET | Freq: Three times a day (TID) | ORAL | 0 refills | Status: DC | PRN

## 2018-05-11 MED ORDER — DIPHENHYDRAMINE-ZINC ACETATE 2-0.1 % EX CREA: TOPICAL_CREAM | Freq: Two times a day (BID) | CUTANEOUS | 0 refills | Status: AC | PRN

## 2018-05-11 NOTE — Progress Notes (Signed)
Physical Therapy Treatment Patient Details Name: Anthony CUMPIAN Sr. MRN: [id number] DOB: May 25, 1932 Today's Date: 05/11/2018    History of Present Illness 83 y.o. male admitted with CHF exacerbation. PMHx: CHF with EF of 25%, NICM, pulmonary HTN, RLS, OSA on CPAP, severe tricuspid regurgitation, permanent Afib with slow ventricular response, pacemaker    PT Comments    Mr. Calvin doing well - moving about room with nursing and family upon PT arrival. Able to progress mobility in hallway today with RW. Does require consistent verbal cueing for safety with device as well as posturing and obstacle navigation. Continue to recommend SNF at discharge.     Follow Up Recommendations  SNF     Equipment Recommendations  None recommended by PT    Recommendations for Other Services OT consult     Precautions / Restrictions Precautions Precautions: Fall Precaution Comments: monitor O2 sats Restrictions Weight Bearing Restrictions: No    Mobility  Bed Mobility               General bed mobility comments: up in restroom with nursing staff  Transfers Overall transfer level: Needs assistance Equipment used: Rolling walker (2 wheeled) Transfers: Sit to/from [facility] Sit to Stand: Min assist;Min guard Stand pivot transfers: Min assist;Min guard       General transfer comment: very light min A to min guard to stand at bedside primarily for safety and steadying.   Ambulation/Gait Ambulation/Gait assistance: Min guard Gait Distance (Feet): (hallway ambulation) Assistive device: Rolling walker (2 wheeled) Gait Pattern/deviations: Step-through pattern;Decreased stride length;Trunk flexed Gait velocity: decreased   General Gait Details: excessive trunk flexion with poor safety awareness with device; requires cueing for safety to remain close to device   Stairs             Wheelchair Mobility    Modified Rankin (Stroke Patients Only)        Balance Overall balance assessment: Needs assistance Sitting-balance support: No upper extremity supported;Feet supported Sitting balance-Leahy Scale: Good Sitting balance - Comments: prefers forward flexion   Standing balance support: Bilateral upper extremity supported;During functional activity Standing balance-Leahy Scale: Fair Standing balance comment: requires UE support with RW for mobility                            Cognition Arousal/Alertness: Awake/alert Behavior During Therapy: WFL for tasks assessed/performed Overall Cognitive Status: Within Functional Limits for tasks assessed                                 General Comments: appears grossly intact - appropriate conversation      Exercises      General Comments        Pertinent Vitals/Pain Pain Assessment: No/denies pain    Home Living                      Prior Function            PT Goals (current goals can now be found in the care plan section) Acute Rehab PT Goals Patient Stated Goal: go home PT Goal Formulation: With patient Time For Goal Achievement: 05/23/18 Potential to Achieve Goals: Fair Progress towards PT goals: Progressing toward goals    Frequency    Min 2X/week      PT Plan Current plan remains appropriate    Co-evaluation  AM-PAC PT "6 Clicks" Daily Activity  Outcome Measure  Difficulty turning over in bed (including adjusting bedclothes, sheets and blankets)?: A Little Difficulty moving from lying on back to sitting on the side of the bed? : A Lot Difficulty sitting down on and standing up from a chair with arms (e.g., wheelchair, bedside commode, etc,.)?: Unable Help needed moving to and from a bed to chair (including a wheelchair)?: A Little Help needed walking in hospital room?: A Little Help needed climbing 3-5 steps with a railing? : A Lot 6 Click Score: 14    End of Session Equipment Utilized During Treatment:  Gait belt;Oxygen Activity Tolerance: Patient tolerated treatment well Patient left: in chair;with call bell/phone within reach;with chair alarm set Nurse Communication: Mobility status PT Visit Diagnosis: Other abnormalities of gait and mobility (R26.89);Muscle weakness (generalized) (M62.81);History of falling (Z91.81);Unsteadiness on feet (R26.81)     Time: 0712-1975 PT Time Calculation (min) (ACUTE ONLY): 24 min  Charges:  $Gait Training: 8-22 mins $Therapeutic Activity: 8-22 mins                     Lanney Gins, PT, DPT Supplemental Physical Therapist 05/11/18 12:02 PM Pager: [phone] Office: [phone]

## 2018-05-11 NOTE — Progress Notes (Signed)
Occupational Therapy Treatment Patient Details Name: Anthony MACOMBER Sr. MRN: [id number] DOB: Jun 21, 1932 Today's Date: 05/11/2018    History of present illness 84 y.o. male admitted with CHF exacerbation. PMHx: CHF with EF of 25%, NICM, pulmonary HTN, RLS, OSA on CPAP, severe tricuspid regurgitation, permanent Afib with slow ventricular response, pacemaker   OT comments  Pt making slow progress with all adls.  When pt is sitting, supervision required to do adls just for safety as pt can be impulsive.  When standing, pt requires min guard and when walking, min assist.  If pt refuses SNF, he would benefit from HHOT/Aid although SNF may be best option at this point.   Follow Up Recommendations  SNF;Supervision/Assistance - 24 hour;Other (comment)    Equipment Recommendations  None recommended by OT    Recommendations for Other Services      Precautions / Restrictions Precautions Precautions: Fall Precaution Comments: monitor O2 sats Restrictions Weight Bearing Restrictions: No       Mobility Bed Mobility Overal bed mobility: Modified Independent             General bed mobility comments: HOB up and increased time  Transfers Overall transfer level: Needs assistance Equipment used: Rolling walker (2 wheeled) Transfers: Sit to/from [facility] Sit to Stand: Min assist Stand pivot transfers: Min assist       General transfer comment: Pt powered up well but require more assist when weight shifting with feet.    Balance Overall balance assessment: Needs assistance Sitting-balance support: No upper extremity supported;Feet supported Sitting balance-Leahy Scale: Good     Standing balance support: Single extremity supported Standing balance-Leahy Scale: Fair Standing balance comment: Pt could stand unsupported for very brief periods of time during adls but cannot be mobile without outside support.  Pt mildly impulsive (possibly personality) so is a safety  risk.                           ADL either performed or assessed with clinical judgement   ADL Overall ADL's : Needs assistance/impaired Eating/Feeding: Independent;Sitting   Grooming: Wash/dry hands;Wash/dry face;Supervision/safety;Set up;Standing Grooming Details (indicate cue type and reason): Pt walked to sink with hand held assist. Pt is unsteady on his feet when ambulating to sink.         Upper Body Dressing : Set up;Sitting   Lower Body Dressing: Min guard;Sit to/from stand;Cueing for safety Lower Body Dressing Details (indicate cue type and reason): Pt fully dressed self but does require min guard when up on his feet to fasten clothing . Toilet Transfer: Ambulation;Regular Toilet;Grab bars;Minimal Print production planner Details (indicate cue type and reason): min guard walking to bathroom and getting into sitting. Toileting- Water quality scientist and Hygiene: Min guard;Sit to/from stand Toileting - Clothing Manipulation Details (indicate cue type and reason): min guard only when standing.     Functional mobility during ADLs: Supervision/safety;Rolling walker General ADL Comments: Pt. requires extra time.     Vision   Vision Assessment?: No apparent visual deficits   Perception     Praxis      Cognition Arousal/Alertness: Awake/alert Behavior During Therapy: WFL for tasks assessed/performed Overall Cognitive Status: Within Functional Limits for tasks assessed                                 General Comments: appears grossly intact.  Once awake, pt's cognition was functional and  processing was normal.        Exercises     Shoulder Instructions       General Comments      Pertinent Vitals/ Pain       Pain Assessment: No/denies pain  Home Living                                          Prior Functioning/Environment              Frequency  Min 2X/week        Progress Toward Goals  OT  Goals(current goals can now be found in the care plan section)  Progress towards OT goals: Progressing toward goals  Acute Rehab OT Goals Patient Stated Goal: go home OT Goal Formulation: With patient Time For Goal Achievement: 05/19/18 Potential to Achieve Goals: Good ADL Goals Pt Will Perform Grooming: with set-up;with supervision;sitting;standing Pt Will Perform Lower Body Bathing: with set-up;with supervision;sit to/from stand Pt Will Perform Lower Body Dressing: with set-up;with supervision;sit to/from stand Pt Will Transfer to Toilet: with supervision;ambulating Pt Will Perform Toileting - Clothing Manipulation and hygiene: with supervision;sit to/from stand Additional ADL Goal #1: Pt will use purse lip breathing prn during activiites without cues  Plan Discharge plan remains appropriate    Co-evaluation                 AM-PAC PT "6 Clicks" Daily Activity     Outcome Measure   Help from another person eating meals?: None Help from another person taking care of personal grooming?: A Little Help from another person toileting, which includes using toliet, bedpan, or urinal?: A Little Help from another person bathing (including washing, rinsing, drying)?: A Little Help from another person to put on and taking off regular upper body clothing?: A Little Help from another person to put on and taking off regular lower body clothing?: A Little 6 Click Score: 19    End of Session Equipment Utilized During Treatment: Gait belt;Rolling walker  OT Visit Diagnosis: Unsteadiness on feet (R26.81);Other abnormalities of gait and mobility (R26.89)   Activity Tolerance Patient tolerated treatment well   Patient Left in chair;with call bell/phone within reach;with chair alarm set;with nursing/sitter in room   Nurse Communication Mobility status        Time: 3335-4562 OT Time Calculation (min): 18 min  Charges: OT General Charges $OT Visit: 1 Visit OT Treatments $Self  Care/Home Management : 8-22 mins  [facility] Anitria Andon,OTR/L [phone]   Glenford Peers 05/11/2018, 8:37 AM

## 2018-05-11 NOTE — Progress Notes (Signed)
Pt placed on CPAP full face mask patient states this is what he uses at home.  BIPAP V60 machine removed from room pt was not requiring it any longer as well as refusing to wear it.  Tolerating regular CPAP well.  RT will continue to monitor.

## 2018-05-11 NOTE — Progress Notes (Addendum)
Patient will discharge to [facility] Anticipated discharge date: 05/11/18 Family notified: Lavone Orn, daughter Transportation by: [facility]  Nurse to call report to [phone]. Patient will go to room 303 at the facility.   CSW signing off.  Estanislado Emms, [place]  Clinical Social Worker

## 2018-05-11 NOTE — Care Management Important Message (Signed)
Important Message  Patient Details  Name: Anthony EMPSON Sr. MRN: [id number] Date of Birth: 07/30/1932   Medicare Important Message Given:  Yes    Cherith Tewell P Keena Dinse 05/11/2018, 1:29 PM

## 2018-05-11 NOTE — Progress Notes (Signed)
Patient transport to [facility] via [facility] accompanied by family.
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
## 2018-05-11 NOTE — Progress Notes (Signed)
PROGRESS NOTE    [name]  ZRA:[id number] DOB: 1931-08-10 DOA: 05/03/2018 PCP: [name], MD   Brief Narrative:  Anthony Agar Sr. is a 84 y.o. male with medical history significant of RLS; pulmonary HTN; pacemaker placement; afib on Eliquis; OSA on CPAP; HTN; HLD; CAD; stage 3 CKD; and CHF (EF 25-30% in 6/19) presenting with SOB. He has had symptoms for a couple of days. He has been breaking out in a rash and itching all over, as well.  The rash has been present for a couple of days. +LE edema.  Weight increased from 179-191.  No orthopnea.  He wears 2L home O2. In the ED, sats noted to be in the 70s. Pt admitted for acute on chronic heart failure exacerbation and acute on chronic renal failure. He was treated with IV lasix and metolazone. Cardiology and nephrologist were assisting with his care. Patient became more lethargic on 9-26, blood gas showed hypercapnic respiratory failure. Patient was place on BIPAP. CCM consulted. Patient subseqlty improved and became more alert. Pt also noted to have pancytopenia. Dr Marin Olp was consulted.    Today, met pt eating breakfast, pt denies any new complaints. Reports feeling much better.  Assessment & Plan:   Principal Problem:   Acute on chronic respiratory failure with hypoxia (HCC) Active Problems:   OSA on CPAP   BPH (benign prostatic hyperplasia)   Chronic atrial fibrillation   Acute renal failure superimposed on stage 3 chronic kidney disease (HCC)   Biventricular cardiac pacemaker  st Judes  dual chamber with LV in atrial port    Acute on chronic systolic congestive heart failure (HCC)   Petechial rash   AKI (acute kidney injury) ([place])  Acute on chronic hypoxic and hypercapnic respiratory failure, 2/2 CHF/pulm edema exacerbation Vs PulmHTN Stable Presents with hypoxemia, BNP 1865 Patient noted to be more lethargic, confused on 9-26. Blood gas showed PH 7.2 PCO2 74, po2 ; 78. Patient was transfered to SDU, CCM  consulted. Improved on BIPAP, needs to use BIPAP at HS  On oral lasix as noted below Continue supplemental O2  Acute on chronic systolic heart failure exacerbation  Presents with hypoxemia, BNP elevated, chest x ray; cardiomegaly, mild vascular congestion , interstitial edema ECHO on 01/2018: EF of 25-30%, PulmHTN 53 mm Hg Continue with lasix, metolazone  Appreciate cardiology and nephrology  assistance  Strict I & O, daily weight  Acute on chronic renal failure stage II  Improving Suspect related to heart failure. Cr baseline 1.2- 1.5 Nephrology on board, signed off Avoid nephrotoxics  Permanent A fib Biventricular pacer placement Not on anticoagulation due to GI bleed history   Pancytopenia likely 2/2 myelodysplastic syndrome 3 months ago he had low WBC, and low hb. Platelet count also mildly low.  B12 level; 861. Peripheral smear ordered. LDH 313 Dr Marin Olp consulted, underwent  bone marrow biopsy 9-30, result suspicious for MDS. Dr Marin Olp ordered 2U of PRBC for transfusion on 05/10/18, started aranesp Daily CBC  Rash Petechial rash  ??Doxy, continue to hold Benadryl, hydroxyzine prn  BPH Continue Avodart and Flomax  OSA on CPAP BIPAP at night  [facility] Palliative team consulted for [name] discussions    DVT prophylaxis: SCD Code Status: Full code  Family Communication: None at bedside Disposition Plan: SNF  Consultants:   Cardiology  Pulmonology  Nephrology    Procedures: None   Antimicrobials: None   Subjective: Today, met pt eating breakfast, pt denies any new complaints. Reports feeling much  better.  Objective: Vitals:   05/11/18 0727 05/11/18 0736 05/11/18 0842 05/11/18 1227  BP: 102/66  104/64 111/60  Pulse: 70  73 76  Resp: 18   11  Temp:    98 F (36.7 C)  TempSrc:   Oral Oral  SpO2: 90% 95% 95%   Weight:      Height:        Intake/Output Summary (Last 24 hours) at 05/11/2018 1405 Last data filed at 05/11/2018 1227 Gross per 24 hour   Intake 1441.31 ml  Output 2375 ml  Net -933.69 ml   Filed Weights   05/09/18 0649 05/10/18 0400 05/11/18 0533  Weight: 80 kg 79.2 kg 79.1 kg    Examination:  General exam; NAD Respiratory system: Mild bilateral crackles noted Cardiovascular system: S1, S2 present Gastrointestinal system; Soft, NT, ND, BS present Extremities/Skin: Petechia rash     Data Reviewed: I have personally reviewed following labs and imaging studies  CBC: Recent Labs  Lab 05/06/18 0554 05/07/18 0400 05/08/18 0409 05/09/18 0350 05/11/18 0506  WBC 3.8* 2.6* 3.3* 2.7* 2.9*  NEUTROABS 2.9 1.6* 2.0 1.4* 1.6*  HGB 9.0* 8.3* 8.5* 8.4* 10.5*  HCT 28.7* 25.8* 27.0* 26.8* 32.1*  MCV 115.3* 113.7* 115.9* 115.0* 108.8*  PLT 98* 91* 109* 96* 92*   Basic Metabolic Panel: Recent Labs  Lab 05/07/18 0400 05/08/18 0409 05/09/18 0350 05/10/18 0408 05/11/18 0506  NA 142 143 138 138 138  K 3.6 4.2 4.5 4.4 4.0  CL 93* 92* 89* 90* 92*  CO2 43* 44* 44* 43* 41*  GLUCOSE 94 97 98 110* 94  BUN 69* 63* 60* 57* 53*  CREATININE 1.47* 1.32* 1.34* 1.38* 1.29*  CALCIUM 8.9 9.4 9.0 9.0 8.7*  PHOS 2.8 2.9 3.5 4.4 4.1   GFR: Estimated Creatinine Clearance: 39.9 mL/min (A) (by C-G formula based on SCr of 1.29 mg/dL (H)). Liver Function Tests: Recent Labs  Lab 05/05/18 0749  05/07/18 0400 05/08/18 0409 05/09/18 0350 05/10/18 0408 05/11/18 0506  AST 27  --   --   --   --   --   --   ALT 35  --   --   --   --   --   --   ALKPHOS 74  --   --   --   --   --   --   BILITOT 1.3*  --   --   --   --   --   --   PROT 6.4*  --   --   --   --   --   --   ALBUMIN 2.9*   < > 3.0* 3.1* 2.9* 2.9* 3.0*   < > = values in this interval not displayed.   No results for input(s): LIPASE, AMYLASE in the last 168 hours. Recent Labs  Lab 05/05/18 1110  AMMONIA 19   Coagulation Profile: Recent Labs  Lab 05/08/18 0409  INR 1.31   Cardiac Enzymes: No results for input(s): CKTOTAL, CKMB, CKMBINDEX, TROPONINI in the last  168 hours. BNP (last 3 results) No results for input(s): PROBNP in the last 8760 hours. HbA1C: No results for input(s): HGBA1C in the last 72 hours. CBG: No results for input(s): GLUCAP in the last 168 hours. Lipid Profile: No results for input(s): CHOL, HDL, LDLCALC, TRIG, CHOLHDL, LDLDIRECT in the last 72 hours. Thyroid Function Tests: No results for input(s): TSH, T4TOTAL, FREET4, T3FREE, THYROIDAB in the last 72 hours. Anemia Panel: No results for input(s): VITAMINB12,  FOLATE, FERRITIN, TIBC, IRON, RETICCTPCT in the last 72 hours. Sepsis Labs: No results for input(s): PROCALCITON, LATICACIDVEN in the last 168 hours.  Recent Results (from the past 240 hour(s))  MRSA PCR Screening     Status: None   Collection Time: 05/04/18  4:20 PM  Result Value Ref Range Status   MRSA by PCR NEGATIVE NEGATIVE Final    Comment:        The GeneXpert MRSA Assay (FDA approved for NASAL specimens only), is one component of a comprehensive MRSA colonization surveillance program. It is not intended to diagnose MRSA infection nor to guide or monitor treatment for MRSA infections. Performed at [facility], [place] [address], [place],  [address]          Radiology Studies: No results found.      Scheduled Meds: . allopurinol  200 mg Oral Daily  . aspirin EC  81 mg Oral Daily  . dutasteride  0.5 mg Oral Daily  . escitalopram  5 mg Oral QHS  . furosemide  20 mg Intravenous Once  . furosemide  80 mg Oral BID  . lactulose  20 g Oral Daily  . multivitamin  1 tablet Oral Daily  . pantoprazole  40 mg Oral Daily  . potassium chloride  40 mEq Oral BID  . rOPINIRole  4 mg Oral QHS  . sodium chloride flush  3 mL Intravenous Q12H  . tamsulosin  0.4 mg Oral QPC breakfast   Continuous Infusions: . sodium chloride Stopped (05/10/18 0854)  . ferric gluconate (FERRLECIT/NULECIT) IV 125 mg (05/11/18 1240)     LOS: 8 days     Alma Friendly, MD [facility]   If  7PM-7AM, please contact night-coverage [email or web address] 05/11/2018, 2:05 PM

## 2018-05-11 NOTE — Discharge Summary (Signed)
Discharge Summary  [name] Sr. [id number]:[id number] DOB: 23-Jan-1932  PCP: [name], MD  Admit date: 05/03/2018 Discharge date: 05/11/2018  Time spent: 35 mins  Recommendations for Outpatient Follow-up:  1. PCP 2. Cardiology 3. Oncology   Discharge Diagnoses:  Active Hospital Problems   Diagnosis Date Noted  . Acute on chronic respiratory failure with hypoxia ([place]) 09/26/2017  . AKI (acute kidney injury) ([place])   . Acute on chronic systolic congestive heart failure ([place]) 05/03/2018  . Petechial rash 05/03/2018  . Biventricular cardiac pacemaker  st Judes  dual chamber with LV in atrial port  03/29/2018  . Acute renal failure superimposed on stage 3 chronic kidney disease ([place]) 11/03/2017  . Chronic atrial fibrillation 11/23/2016  . BPH (benign prostatic hyperplasia) 10/13/2011  . OSA on CPAP 10/13/2011    Resolved Hospital Problems  No resolved problems to display.    Discharge Condition: Stable  Diet recommendation: Heart healthy  Vitals:   05/11/18 0842 05/11/18 1227  BP: 104/64 111/60  Pulse: 73 76  Resp:  11  Temp:  98 F (36.7 C)  SpO2: 95%     History of present illness:  Anthony Johnsonis a 84 y.o.malewith medical history significant ofRLS; pulmonary HTN; pacemaker placement; afib on Eliquis; OSA on CPAP; HTN; HLD; CAD; stage 3 CKD; and CHF (EF 25-30% in 6/19) presenting withSOB. He has had symptoms for a couple of days. He has been breaking out in a rash and itching all over, as well. The rash has been present for a couple of days. +LE edema. Weight increased from 179-191. No orthopnea. He wears 2L home O2. In the ED, sats noted to be in the 70s. Pt admitted for acute on chronic heart failure exacerbation and acute on chronic renal failure. He was treated with IV lasix and metolazone. Cardiology and nephrologist were assisting with his care. Patient became more lethargic on 9-26, blood gas showed hypercapnic respiratory failure. Patient was  place on BIPAP. CCM consulted. Patient subseqlty improved and became more alert. Pt also noted to have pancytopenia. Dr Marin Olp was consulted.    Today, met pt eating breakfast, pt denies any new complaints. Reports feeling much better. Palliative met with pt and family, continue present management, including full code.   Hospital Course:  Principal Problem:   Acute on chronic respiratory failure with hypoxia (HCC) Active Problems:   OSA on CPAP   BPH (benign prostatic hyperplasia)   Chronic atrial fibrillation   Acute renal failure superimposed on stage 3 chronic kidney disease (HCC)   Biventricular cardiac pacemaker  st Judes  dual chamber with LV in atrial port    Acute on chronic systolic congestive heart failure (HCC)   Petechial rash   AKI (acute kidney injury) ([place])  Acute on chronic hypoxic and hypercapnic respiratory failure, 2/2 CHF/pulm edema exacerbation Vs PulmHTN Stable Presents with hypoxemia, BNP 1865 Improved on BIPAP, needs to use BIPAP at HS, pt currently refusing, but agreeing to use CPAP Continue supplemental O2  Acute on chronic systolic heart failure exacerbation  Presents with hypoxemia, BNP elevated, chest x ray; cardiomegaly, mild vascular congestion , interstitial edema ECHO on 01/2018: EF of 25-30%, PulmHTN 53 mm Hg Continue with lasix, metolazone  Appreciate cardiology and nephrology  assistance  Strict I & O, daily weight  Acute on chronic renal failure stage II  Stable Suspect related to heart failure. Cr baseline 1.2- 1.5 Nephrology on board, signed off Avoid nephrotoxics  Permanent A fib Biventricular pacer  placement Not on anticoagulation due to GI bleed history   Pancytopenia likely 2/2 myelodysplastic syndrome 3 months ago he had low WBC, and low hb. Platelet count also mildly low.  B12 level; 861. Peripheral smear ordered. LDH 313 Dr Marin Olp consulted, underwent  bone marrow biopsy 9-30, result suspicious for MDS. Dr Marin Olp  ordered 2U of PRBC for transfusion on 05/10/18, started aranesp Follow up with Dr Marin Olp as an outpt, no further recs for now  Rash Petechial rash, unknown etiology  Benadryl, hydroxyzine prn  BPH Continue Avodart and Flomax  OSA on CPAP Refusing BIPAP at night, ok for CPAP  [facility] Palliative team consulted for [place] discussions, family wants to continue present management    Procedures:  None  Consultations:  Cardiology  Pulmonology  Nephrology  Discharge Exam: BP 111/60 (BP Location: Right Arm)   Pulse 76   Temp 98 F (36.7 C) (Oral)   Resp 11   Ht 5' 7" (1.702 m)   Wt 79.1 kg   SpO2 95%   BMI 27.30 kg/m   General: NAD Cardiovascular: S1, S2 present Respiratory: Mild bibasilar crackles noted  Discharge Instructions You were cared for by a hospitalist during your hospital stay. If you have any questions about your discharge medications or the care you received while you were in the hospital after you are discharged, you can call the unit and asked to speak with the hospitalist on call if the hospitalist that took care of you is not available. Once you are discharged, your primary care physician will handle any further medical issues. Please note that NO REFILLS for any discharge medications will be authorized once you are discharged, as it is imperative that you return to your primary care physician (or establish a relationship with a primary care physician if you do not have one) for your aftercare needs so that they can reassess your need for medications and monitor your lab values.   Allergies as of 05/11/2018      Reactions   Vicodin [hydrocodone-acetaminophen] Itching, Nausea Only      Medication List    STOP taking these medications   mupirocin ointment 2 % Commonly known as:  BACTROBAN   traMADol 50 MG tablet Commonly known as:  ULTRAM     TAKE these medications   allopurinol 100 MG tablet Commonly known as:  ZYLOPRIM Take 2 tablets (200 mg  total) by mouth daily.   aspirin 81 MG EC tablet Take 1 tablet (81 mg total) by mouth daily. Start taking on:  05/12/2018   diphenhydrAMINE-zinc acetate cream Commonly known as:  BENADRYL Apply topically 2 (two) times daily as needed for itching.   dutasteride 0.5 MG capsule Commonly known as:  AVODART Take 0.5 mg by mouth daily.   escitalopram 5 MG tablet Commonly known as:  LEXAPRO Take 1 tablet (5 mg total) by mouth at bedtime.   furosemide 80 MG tablet Commonly known as:  LASIX Take 1 tablet (80 mg total) by mouth 2 (two) times daily.   hydrOXYzine 10 MG tablet Commonly known as:  ATARAX/VISTARIL Take 1 tablet (10 mg total) by mouth 3 (three) times daily as needed for itching. What changed:    medication strength  See the new instructions.   ipratropium-albuterol 0.5-2.5 (3) MG/3ML Soln Commonly known as:  DUONEB Take 3 mLs by nebulization every 4 (four) hours as needed.   mirtazapine 15 MG tablet Commonly known as:  REMERON Take 15 mg by mouth at bedtime.   multivitamin with  minerals Tabs tablet Take 1 tablet by mouth daily.   OXYGEN Inhale 2 L into the lungs daily.   pantoprazole 40 MG tablet Commonly known as:  PROTONIX Take 1 tablet (40 mg total) by mouth daily.   potassium chloride 10 MEQ tablet Commonly known as:  K-DUR Take 10 mEq by mouth 2 (two) times daily.   PRESERVISION AREDS 2 Caps Take 1 capsule by mouth daily.   rOPINIRole 4 MG tablet Commonly known as:  REQUIP Take 4 mg by mouth at bedtime.   tamsulosin 0.4 MG Caps capsule Commonly known as:  FLOMAX Take 0.4 mg by mouth daily after breakfast.   triamcinolone cream 0.1 % Commonly known as:  KENALOG Apply 1 application topically 2 (two) times daily.      Allergies  Allergen Reactions  . Vicodin [Hydrocodone-Acetaminophen] Itching and Nausea Only   Follow-up Information    Almyra Deforest, [place] Follow up on 05/19/2018.   Specialties:  Cardiology, Radiology Why:  Please arrive 15  minutes early for your 10:00am post-hospital cardiology follow-up appointment Contact information: [address] [place] [address] [phone]        [name], MD Follow up on 07/03/2018.   Specialty:  Cardiology Why:  Please arrive 15 minutes early for your 2:45pm appointment Contact information: [address] [place] [address] [phone]        [name], [name] Him, MD. Schedule an appointment as soon as possible for a visit in 1 week(s).   Specialty:  Internal Medicine Contact information: [place] [address] [phone]        [name], MD .   Specialty:  Cardiology Contact information: [address] [place] [address] [phone]        Volanda Napoleon, MD. Schedule an appointment as soon as possible for a visit in 4 week(s).   Specialty:  Oncology Contact information: [place] [address] [phone]            The results of significant diagnostics from this hospitalization (including imaging, microbiology, ancillary and laboratory) are listed below for reference.    Significant Diagnostic Studies: Dg Chest 2 View  Result Date: 05/03/2018 CLINICAL DATA:  84 year old male with CHF. EXAM: CHEST - 2 VIEW COMPARISON:  Chest radiograph dated 03/30/2018 FINDINGS: There is cardiomegaly with mild vascular congestion and probable mild edema. No focal consolidation, pleural effusion, or pneumothorax. There is atherosclerotic calcification of the aorta. The aorta is tortuous. Left pectoral pacemaker device. Osteopenia with degenerative changes of the spine. No acute osseous pathology. IMPRESSION: Cardiomegaly with mild vascular congestion and probable mild interstitial edema. Electronically Signed   By: Anner Crete M.D.   On: 05/03/2018 04:53   Dg Shoulder Right  Result Date: 05/09/2018 CLINICAL DATA:  Pain EXAM: RIGHT SHOULDER -  2+ VIEW COMPARISON:  04/05/2017 FINDINGS: Probable prior distal clavicle resection. Anchors noted in the humeral head. Advanced arthritic changes in the right glenohumeral joint with spurring, joint space loss and loss of subacromial space. No fracture, subluxation or dislocation. IMPRESSION: Advanced arthritic changes in the right shoulder, postoperative changes. No acute bony abnormality. Electronically Signed   By: Rolm Baptise M.D.   On: 05/09/2018 09:37   US Renal  Result Date: 05/05/2018 CLINICAL DATA:  Acute renal injury EXAM: RENAL / URINARY TRACT ULTRASOUND COMPLETE COMPARISON:  01/10/2018 FINDINGS: Right Kidney: Length: 12.6 cm. Multiple cysts are identified. The largest of these measures  3.9 cm in greatest dimension and stable from the prior exam. No hydronephrosis is noted. Left Kidney: Length: 13.2 cm. Multiple cysts are noted. The largest of these measures 6.8 cm in greatest dimension also stable from the previous exam. No hydronephrosis is noted. Bladder: Decompressed Small pleural effusions are noted bilaterally. Minimal ascites is seen as well. IMPRESSION: Bilateral renal cysts stable from the prior CT examination. Bilateral pleural effusions and mild ascites. Electronically Signed   By: Inez Catalina M.D.   On: 05/05/2018 06:12   Ct Bone Marrow Biopsy & Aspiration  Result Date: 05/08/2018 INDICATION: 84 year old male with a history of anemia EXAM: CT BONE MARROW BIOPSY AND ASPIRATION MEDICATIONS: None. ANESTHESIA/SEDATION: Moderate (conscious) sedation was employed during this procedure. A total of Versed 1.0 mg and Fentanyl 50 mcg was administered intravenously. Moderate Sedation Time: 10 minutes. The patient's level of consciousness and vital signs were monitored continuously by radiology nursing throughout the procedure under my direct supervision. FLUOROSCOPY TIME:  CT COMPLICATIONS: None PROCEDURE: The procedure risks, benefits, and alternatives were explained to the patient. Questions  regarding the procedure were encouraged and answered. The patient understands and consents to the procedure. Scout CT of the pelvis was performed for surgical planning purposes. The posterior pelvis was prepped with Chlorhexidine in a sterile fashion, and a sterile drape was applied covering the operative field. A sterile gown and sterile gloves were used for the procedure. Local anesthesia was provided with 1% Lidocaine. Posterior iliac bone was targeted for biopsy. The skin and subcutaneous tissues were infiltrated with 1% lidocaine without epinephrine. A small stab incision was made with an 11 blade scalpel, and an 11 gauge Murphy needle was advanced with CT guidance to the posterior cortex. Manual forced was used to advance the needle through the posterior cortex and the stylet was removed. A bone marrow aspirate was retrieved and passed to a cytotechnologist in the room. The Murphy needle was then advanced without the stylet for a core biopsy. The core biopsy was retrieved and also passed to a cytotechnologist. Manual pressure was used for hemostasis and a sterile dressing was placed. No complications were encountered no significant blood loss was encountered. Patient tolerated the procedure well and remained hemodynamically stable throughout. IMPRESSION: Status post CT-guided bone marrow biopsy, with tissue specimen sent to pathology for complete histopathologic analysis Signed, Dulcy Fanny. Earleen Newport, DO [facility] Electronically Signed   By: Corrie Mckusick D.O.   On: 05/08/2018 11:16    Microbiology: Recent Results (from the past 240 hour(s))  MRSA PCR Screening     Status: None   Collection Time: 05/04/18  4:20 PM  Result Value Ref Range Status   MRSA by PCR NEGATIVE NEGATIVE Final    Comment:        The GeneXpert MRSA Assay (FDA approved for NASAL specimens only), is one component of a comprehensive MRSA colonization surveillance program. It  is not intended to diagnose MRSA infection nor to guide or monitor treatment for MRSA infections. Performed at [facility], [place] [address], [place], [place] [address]      Labs: Basic Metabolic Panel: Recent Labs  Lab 05/07/18 0400 05/08/18 0409 05/09/18 0350 05/10/18 0408 05/11/18 0506  NA 142 143 138 138 138  K 3.6 4.2 4.5 4.4 4.0  CL 93* 92* 89* 90* 92*  CO2 43* 44* 44* 43* 41*  GLUCOSE 94 97 98 110* 94  BUN 69* 63* 60* 57* 53*  CREATININE 1.47* 1.32* 1.34*  1.38* 1.29*  CALCIUM 8.9 9.4 9.0 9.0 8.7*  PHOS 2.8 2.9 3.5 4.4 4.1   Liver Function Tests: Recent Labs  Lab 05/05/18 0749  05/07/18 0400 05/08/18 0409 05/09/18 0350 05/10/18 0408 05/11/18 0506  AST 27  --   --   --   --   --   --   ALT 35  --   --   --   --   --   --   ALKPHOS 74  --   --   --   --   --   --   BILITOT 1.3*  --   --   --   --   --   --   PROT 6.4*  --   --   --   --   --   --   ALBUMIN 2.9*   < > 3.0* 3.1* 2.9* 2.9* 3.0*   < > = values in this interval not displayed.   No results for input(s): LIPASE, AMYLASE in the last 168 hours. Recent Labs  Lab 05/05/18 1110  AMMONIA 19   CBC: Recent Labs  Lab 05/06/18 0554 05/07/18 0400 05/08/18 0409 05/09/18 0350 05/11/18 0506  WBC 3.8* 2.6* 3.3* 2.7* 2.9*  NEUTROABS 2.9 1.6* 2.0 1.4* 1.6*  HGB 9.0* 8.3* 8.5* 8.4* 10.5*  HCT 28.7* 25.8* 27.0* 26.8* 32.1*  MCV 115.3* 113.7* 115.9* 115.0* 108.8*  PLT 98* 91* 109* 96* 92*   Cardiac Enzymes: No results for input(s): CKTOTAL, CKMB, CKMBINDEX, TROPONINI in the last 168 hours. BNP: BNP (last 3 results) Recent Labs    11/03/17 1344 01/12/18 0306 05/03/18 0406  BNP 1,728.6* 1,360.9* 1,865.0*    ProBNP (last 3 results) No results for input(s): PROBNP in the last 8760 hours.  CBG: No results for input(s): GLUCAP in the last 168 hours.     Signed:  Alma Friendly, MD [facility] 05/11/2018, 3:54 PM

## 2018-05-11 NOTE — Progress Notes (Signed)
CSW met with patient and several family members at bedside, including patient's spouse and adult children. CSW discussed pallaitive meeting outcomes with patient and family and elicited patient and family's choice for disposition plan. Patient and family agreeable to SNF for rehab, and then would like for patient to return home.   CSW provided SNF bed offers. Patient's daughter also requesting referral to [facility], which was not previously sent. CSW sent referral to [facility] and left message for their admissions office; awaiting bed offer. Advised family to choose backup SNF option in case [facility] not available.  CSW to follow. Awaiting family choice for SNF.  Estanislado Emms, [place]

## 2018-05-11 NOTE — [place] FL2 (Signed)
[place] MEDICAID FL2 LEVEL OF CARE SCREENING TOOL     IDENTIFICATION  Patient Name: Anthony RUSSEY Sr. Birthdate: [birth year] Sex: male Admission Date (Current Location): 05/03/2018  [facility] and [place] Number:  Herbalist and Address:  [facility], [place] [address], [place], [place] [address]      Provider Number: [id number]  Attending Physician Name and Address:  Alma Friendly, MD  Relative Name and Phone Number:       Current Level of Care: Hospital Recommended Level of Care: [place] Prior Approval Number:    Date Approved/Denied:   PASRR Number: [id number]  Discharge Plan: SNF    Current Diagnoses: Patient Active Problem List   Diagnosis Date Noted  . AKI (acute kidney injury) ([place])   . Acute on chronic systolic congestive heart failure ([place]) 05/03/2018  . Petechial rash 05/03/2018  . Biventricular cardiac pacemaker  st [place]es  dual chamber with LV in atrial port  03/29/2018  . Congestive heart failure, NYHA class 3, chronic, systolic ([place]) 88/06/314  . GI bleed 11/03/2017  . Acute renal failure superimposed on stage 3 chronic kidney disease ([place]) 11/03/2017  . Chest pain 11/03/2017  . Pancytopenia ([place]) 09/26/2017  . CKD (chronic kidney disease), stage III ([place]) 09/26/2017  . Chronic pain 09/26/2017  . Acute on chronic respiratory failure with hypoxia ([place]) 09/26/2017  . Right shoulder injury, initial encounter 04/08/2017  . Chronic atrial fibrillation 11/23/2016  . Secondary hypercoagulable state ([place])   . Transaminitis   . PAH (pulmonary artery hypertension) ([place])   . Chronic systolic heart failure ([place]) 09/09/2015  . Poor short term memory 11/05/2013  . Fatigue 10/13/2011  . Dyspnea on exertion 10/13/2011  . Restless leg syndrome 10/13/2011  . Chronic anticoagulation 10/13/2011  . Nonischemic cardiomyopathy - coronaries by angiography 2010 10/13/2011  . OSA on CPAP 10/13/2011  . Tremor,  hereditary, benign 10/13/2011  . Depression 10/13/2011  . Other secondary pulmonary hypertension ([place]) 10/13/2011  . MRSA (methicillin resistant Staphylococcus aureus) carrier 10/13/2011  . BPH (benign prostatic hyperplasia) 10/13/2011    Orientation RESPIRATION BLADDER Height & Weight     Self, Time, Situation, Place  O2(Nasal canula 2L) Continent Weight: 174 lb 4.8 oz (79.1 kg) Height:  5\' 7"  (170.2 cm)  BEHAVIORAL SYMPTOMS/MOOD NEUROLOGICAL BOWEL NUTRITION STATUS      Continent Diet  AMBULATORY STATUS COMMUNICATION OF NEEDS Skin   Limited Assist Verbally Surgical wounds(Closed incision on back)                       Personal Care Assistance Level of Assistance  Bathing, Feeding, Dressing Bathing Assistance: Limited assistance Feeding assistance: Independent Dressing Assistance: Limited assistance     Functional Limitations Info  Sight, Hearing, Speech Sight Info: Adequate Hearing Info: Adequate Speech Info: Adequate    SPECIAL CARE FACTORS FREQUENCY  PT (By licensed PT), OT (By licensed OT)     PT Frequency: 5x/week OT Frequency: 5x/week            Contractures Contractures Info: Not present    Additional Factors Info  Psychotropic, Code Status, Allergies Code Status Info: Full Allergies Info: Vicodin (Hydrocodone-acetaminophen)  Psychotropic Info: Lexapro 5mg  daily         Current Medications (05/11/2018):  This is the current hospital active medication list Current Facility-Administered Medications  Medication Dose Route Frequency Provider Last Rate Last Dose  . 0.9 %  sodium chloride infusion  250 mL Intravenous  PRN Elmarie Shiley, MD   Stopped at 05/10/18 [phone]  . acetaminophen (TYLENOL) tablet 650 mg  650 mg Oral Q4H PRN Regalado, Belkys A, MD   650 mg at 05/09/18 0128  . allopurinol (ZYLOPRIM) tablet 200 mg  200 mg Oral Daily Regalado, Belkys A, MD   200 mg at 05/10/18 0838  . aspirin EC tablet 81 mg  81 mg Oral Daily Regalado, Belkys A, MD    81 mg at 05/10/18 0835  . diphenhydrAMINE-zinc acetate (BENADRYL) 2-0.1 % cream   Topical BID PRN Regalado, Belkys A, MD      . dutasteride (AVODART) capsule 0.5 mg  0.5 mg Oral Daily Regalado, Belkys A, MD   0.5 mg at 05/10/18 0834  . escitalopram (LEXAPRO) tablet 5 mg  5 mg Oral QHS Regalado, Belkys A, MD   5 mg at 05/10/18 2139  . ferric gluconate (NULECIT) 125 mg in sodium chloride 0.9 % 100 mL IVPB  125 mg Intravenous Daily Edrick Oh, MD 110 mL/hr at 05/10/18 0854 125 mg at 05/10/18 0854  . furosemide (LASIX) injection 20 mg  20 mg Intravenous Once Volanda Napoleon, MD      . furosemide (LASIX) tablet 80 mg  80 mg Oral BID Edrick Oh, MD   80 mg at 05/11/18 0819  . hydrOXYzine (ATARAX/VISTARIL) tablet 10 mg  10 mg Oral TID PRN Regalado, Belkys A, MD   10 mg at 05/08/18 1906  . ipratropium-albuterol (DUONEB) 0.5-2.5 (3) MG/3ML nebulizer solution 3 mL  3 mL Nebulization Q4H PRN Alma Friendly, MD      . lactulose (CHRONULAC) 10 GM/15ML solution 20 g  20 g Oral Daily Regalado, Belkys A, MD   20 g at 05/10/18 0843  . multivitamin (PROSIGHT) tablet 1 tablet  1 tablet Oral Daily Regalado, Belkys A, MD   1 tablet at 05/10/18 0833  . ondansetron (ZOFRAN) injection 4 mg  4 mg Intravenous Q6H PRN Regalado, Belkys A, MD      . pantoprazole (PROTONIX) EC tablet 40 mg  40 mg Oral Daily Regalado, Belkys A, MD   40 mg at 05/10/18 0836  . potassium chloride SA (K-DUR,KLOR-CON) CR tablet 40 mEq  40 mEq Oral BID Susa Raring, RPH   40 mEq at 05/10/18 2138  . rOPINIRole (REQUIP) tablet 4 mg  4 mg Oral QHS Regalado, Belkys A, MD   4 mg at 05/10/18 2138  . sodium chloride flush (NS) 0.9 % injection 3 mL  3 mL Intravenous Q12H Regalado, Belkys A, MD   3 mL at 05/10/18 2140  . sodium chloride flush (NS) 0.9 % injection 3 mL  3 mL Intravenous PRN Regalado, Belkys A, MD      . tamsulosin (FLOMAX) capsule 0.4 mg  0.4 mg Oral QPC breakfast Regalado, Belkys A, MD   0.4 mg at 05/10/18 1638     Discharge  Medications: Please see discharge summary for a list of discharge medications.  Relevant Imaging Results:  Relevant Lab Results:   Additional Information SSN: [id number]  Estanislado Emms, LCSW

## 2018-05-11 NOTE — Care Management Note (Signed)
Case Management Note  Patient Details  Name: Anthony KUNZLER Sr. MRN: [id number] Date of Birth: Jan 27, 1932  Subjective/Objective:  Pt presented for Acute on Chronic Respiratory Failure- PT recommendations for SNF. CSW following for disposition needs.                   Action/Plan: No further needs from CM @ this time.   Expected Discharge Date:                  Expected Discharge Plan:  Home/Self Care  In-House Referral:  Clinical Social Work  Discharge planning Services  CM Consult  Post Acute Care Choice:  NA Choice offered to:  NA  DME Arranged:  N/A DME Agency:  NA  HH Arranged:  NA HH Agency:  NA  Status of Service:  Completed, signed off  If discussed at [place] of Stay Meetings, dates discussed:    Additional Comments:  Bethena Roys, RN 05/11/2018, 3:21 PM

## 2018-05-11 NOTE — Progress Notes (Signed)
Report called to [facility].  Report given to Chanetta Marshall, RN.

## 2018-05-11 NOTE — Clinical Social Work Placement (Signed)
   CLINICAL SOCIAL WORK PLACEMENT  NOTE  Date:  05/11/2018  Patient Details  Name: Anthony COUSE Sr. MRN: [id number] Date of Birth: Jun 07, 1932  Clinical Social Work is seeking post-discharge placement for this patient at the [place] level of care (*CSW will initial, date and re-position this form in  chart as items are completed):  Yes   Patient/family provided with [place] Work Department's list of facilities offering this level of care within the geographic area requested by the patient (or if unable, by the patient's family).  Yes   Patient/family informed of their freedom to choose among providers that offer the needed level of care, that participate in Medicare, Medicaid or managed care program needed by the patient, have an available bed and are willing to accept the patient.  Yes   Patient/family informed of [facility]'s ownership interest in [facility] and [facility], as well as of the fact that they are under no obligation to receive care at these facilities.  PASRR submitted to EDS on 05/11/18     PASRR number received on 05/11/18     Existing PASRR number confirmed on       FL2 transmitted to all facilities in geographic area requested by pt/family on 05/11/18     FL2 transmitted to all facilities within larger geographic area on       Patient informed that his/her managed care company has contracts with or will negotiate with certain facilities, including the following:  [facility]     Yes   Patient/family informed of bed offers received.  Patient chooses bed at [facility] recommends and patient chooses bed at      Patient to be transferred to [facility] on 05/11/18.  Patient to be transferred to facility by [facility]     Patient family notified on 05/11/18 of transfer.  Name of family member notified:  Lavone Orn, daughter     PHYSICIAN Please prepare  priority discharge summary, including medications, Please prepare prescriptions     Additional Comment:    _______________________________________________ Estanislado Emms, LCSW 05/11/2018, 4:07 PM

## 2018-05-11 NOTE — Telephone Encounter (Signed)
Currently admitted.

## 2018-05-12 ENCOUNTER — Encounter: Payer: Self-pay | Admitting: Adult Health

## 2018-05-12 ENCOUNTER — Non-Acute Institutional Stay (SKILLED_NURSING_FACILITY): Payer: Medicare Other | Admitting: Adult Health

## 2018-05-12 DIAGNOSIS — G4733 Obstructive sleep apnea (adult) (pediatric): Secondary | ICD-10-CM | POA: Diagnosis not present

## 2018-05-12 DIAGNOSIS — G2581 Restless legs syndrome: Secondary | ICD-10-CM | POA: Diagnosis not present

## 2018-05-12 DIAGNOSIS — N179 Acute kidney failure, unspecified: Secondary | ICD-10-CM

## 2018-05-12 DIAGNOSIS — J9622 Acute and chronic respiratory failure with hypercapnia: Secondary | ICD-10-CM

## 2018-05-12 DIAGNOSIS — J9621 Acute and chronic respiratory failure with hypoxia: Secondary | ICD-10-CM

## 2018-05-12 DIAGNOSIS — F339 Major depressive disorder, recurrent, unspecified: Secondary | ICD-10-CM

## 2018-05-12 DIAGNOSIS — I5023 Acute on chronic systolic (congestive) heart failure: Secondary | ICD-10-CM | POA: Diagnosis not present

## 2018-05-12 DIAGNOSIS — Z8719 Personal history of other diseases of the digestive system: Secondary | ICD-10-CM

## 2018-05-12 DIAGNOSIS — I480 Paroxysmal atrial fibrillation: Secondary | ICD-10-CM | POA: Diagnosis not present

## 2018-05-12 DIAGNOSIS — D46Z Other myelodysplastic syndromes: Secondary | ICD-10-CM

## 2018-05-12 DIAGNOSIS — N400 Enlarged prostate without lower urinary tract symptoms: Secondary | ICD-10-CM | POA: Diagnosis not present

## 2018-05-12 NOTE — Progress Notes (Signed)
Location:  [place] Room Number: 540-G Place of Service:  SNF (31) Provider:  Durenda Age, NP  Patient Care Team: [name], [name] Him, MD as PCP - General (Internal Medicine) Croitoru, Dani Gobble, MD as PCP - Cardiology (Cardiology) [name], MD as Consulting Physician (Cardiology)  Extended Emergency Contact Information Primary Emergency Contact: Kana,Shelia Address: [address]          [place], [place] [address] Johnnette Litter of [place] Phone: [phone] Mobile Phone: [phone] Relation: Spouse Secondary Emergency Contact: [place] of [place] Mobile Phone: [phone] Relation: Daughter  Code Status:  Full Code  Goals of care: Advanced Directive information Advanced Directives 05/03/2018  Does Patient Have a Medical Advance Directive? No  Type of Advance Directive -  Does patient want to make changes to medical advance directive? -  Copy of [place] in Chart? -  Would patient like information on creating a medical advance directive? No - Patient declined  Pre-existing out of facility DNR order (yellow form or pink MOST form) -     Chief Complaint  Patient presents with  . Acute Visit    Hospital followup, status post hospitalization at [facility] 9/25-10/3/19 for acute on chronic respiratory failure with hypoxia    HPI:  Pt is an 84 y.o. male seen today for hospital followup.  He was admitted to [facility] on 05/11/18 for short-term rehabilitation following an admission at [facility] 9/25-10/3/19 for chronic respiratory failure with hypoxia.  He has a PMH of RLS, pulmonary hypertension, atrial fibrillation, OSA, stage 3 CKD, CAD, and CHF. He was admitted for acute on chronic heart failure exacerbation and acute on chronic renal failure he was having shortness of breath for a couple of days and broke out in a rash.  O2 sat was in the 70s he was treated with IV Lasix and  metolazone.  Cardiology and nephrology assisted in the care of the patient.  He became lethargic on 9/26 and blood gas showed hypercapnic respiratory failure.  He was placed on BiPAP.  He then improved and became more alert.  He was noted to have pancytopenia and Dr. Marin Olp was consulted.  He underwent bone marrow biopsy on 9/30 and result was suspicious for MDS.  Dr. Marin Olp ordered 2 units of packed RBC for transfusion on 05/10/2018 and was started on Aranesp.     Past Medical History:  Diagnosis Date  . Arthritis    "feet" (09/30/2017)  . CHF (congestive heart failure) ([place])   . Chronic bronchitis ([place])   . CKD (chronic kidney disease), stage III ([place])    [name] 09/30/2017  . Coronary artery disease   . Diverticulitis   . Dyspnea   . GERD (gastroesophageal reflux disease)   . Gout   . History of blood transfusion    "blood loss" (09/30/2017)  . Hyperlipidemia   . Hypertension   . MDS (myelodysplastic syndrome), high grade ([place]) 05/11/2018  . MI (myocardial infarction) ([place]) ~ 2000   "light one"  . Nonischemic cardiomyopathy (HCC)    mild  . OSA on CPAP   . Pacemaker single chamber, [place], 2013 10/13/2011  . Permanent atrial fibrillation    on Eliquis  . Presence of permanent cardiac pacemaker   . Pulmonary hypertension ([place])   . Restless legs    Past Surgical History:  Procedure Laterality Date  . APPENDECTOMY  12/2000   [name] 12/22/2010  . BIV UPGRADE N/A 02/22/2018   Procedure: BIV  UPGRADE;  Surgeon: [name], MD;  Location: [place] CV LAB;  Service: Cardiovascular;  Laterality: N/A;  . BIV UPGRADE N/A 03/29/2018   Procedure: BIV PPM UPGRADE;  Surgeon: [name], MD;  Location: [place] CV LAB;  Service: Cardiovascular;  Laterality: N/A;  . CARDIAC CATHETERIZATION  11/07/2008   nonischemic cardiomyopathy,pulmonary hypertension  . CATARACT EXTRACTION W/ INTRAOCULAR LENS  IMPLANT, BILATERAL Bilateral   . CIRCUMCISION  12/2005   [name]  12/22/2010  . COLOSTOMY  12/2000   [name] 12/22/2010  . COLOSTOMY REVERSAL  07/2001   [name] 12/22/2010  . CORONARY ANGIOPLASTY  06/01/1999   successful to ostium of the first diagonal  . ESOPHAGOGASTRODUODENOSCOPY N/A 08/22/2013   Procedure: ESOPHAGOGASTRODUODENOSCOPY (EGD);  Surgeon: [name], MD;  Location: [facility] ENDOSCOPY;  Service: Endoscopy;  Laterality: N/A;  h/p in file cabinet, jackie  . ESOPHAGOGASTRODUODENOSCOPY N/A 11/05/2014   Procedure: ESOPHAGOGASTRODUODENOSCOPY (EGD);  Surgeon: Clarene Essex, MD;  Location: [facility] ENDOSCOPY;  Service: Endoscopy;  Laterality: N/A;  . ESOPHAGOGASTRODUODENOSCOPY N/A 11/08/2016   Procedure: ESOPHAGOGASTRODUODENOSCOPY (EGD);  Surgeon: [name], MD;  Location: [facility] ENDOSCOPY;  Service: Endoscopy;  Laterality: N/A;  . ESOPHAGOGASTRODUODENOSCOPY (EGD) WITH PROPOFOL Left 11/05/2017   Procedure: ESOPHAGOGASTRODUODENOSCOPY (EGD) WITH PROPOFOL;  Surgeon: [name], MD;  Location: [place];  Service: Endoscopy;  Laterality: Left;  . HOT HEMOSTASIS N/A 11/05/2014   Procedure: HOT HEMOSTASIS (ARGON PLASMA COAGULATION/BICAP);  Surgeon: Clarene Essex, MD;  Location: [facility] ENDOSCOPY;  Service: Endoscopy;  Laterality: N/A;  . INSERT / REPLACE / REMOVE PACEMAKER    . JOINT REPLACEMENT    . MASS EXCISION Left    hand w/ulnar artery reconstruction/notes 12/22/2010  . NM MYOCAR PERF WALL MOTION  11/24/2007   normal  . PERMANENT PACEMAKER INSERTION  10/04/2012   [employer]  . PERMANENT PACEMAKER INSERTION N/A 10/12/2011   Procedure: PERMANENT PACEMAKER INSERTION;  Surgeon: [name], MD;  Location: [place] CATH LAB;  Service: Cardiovascular;  Laterality: N/A;  . REPLACEMENT TOTAL KNEE Bilateral 11/2006   right-left/notes 12/22/2010  . ROTATOR CUFF REPAIR Right 09/2004   [name] 12/22/2010  . SAVORY DILATION N/A 08/22/2013   Procedure: SAVORY DILATION;  Surgeon: [name], MD;  Location: [facility] ENDOSCOPY;  Service: Endoscopy;  Laterality: N/A;  . SHOULDER SURGERY  Right    "fell off house; messed up 3 things in my arm"  . TONSILLECTOMY    . US ECHOCARDIOGRAPHY  02/01/2011   LA is mod-severely dilated,AOV & root sclerotic,ca+ AOV leaflets    Allergies  Allergen Reactions  . Vicodin [Hydrocodone-Acetaminophen] Itching and Nausea Only    Outpatient Encounter Medications as of 05/12/2018  Medication Sig  . allopurinol (ZYLOPRIM) 100 MG tablet Take 2 tablets (200 mg total) by mouth daily.  [name] aspirin EC 81 MG EC tablet Take 1 tablet (81 mg total) by mouth daily.  . diphenhydrAMINE-zinc acetate (BENADRYL) cream Apply topically 2 (two) times daily as needed for itching.  . dutasteride (AVODART) 0.5 MG capsule Take 0.5 mg by mouth daily.  [name] escitalopram (LEXAPRO) 5 MG tablet Take 1 tablet (5 mg total) by mouth at bedtime.  . furosemide (LASIX) 80 MG tablet Take 1 tablet (80 mg total) by mouth 2 (two) times daily.  . hydrOXYzine (ATARAX/VISTARIL) 10 MG tablet Take 1 tablet (10 mg total) by mouth 3 (three) times daily as needed for itching.  [name] ipratropium-albuterol (DUONEB) 0.5-2.5 (3) MG/3ML SOLN Take 3 mLs by nebulization every 4 (four) hours as needed.  . mirtazapine (REMERON)  15 MG tablet Take 15 mg by mouth at bedtime.  . Multiple Vitamin (MULTIVITAMIN WITH MINERALS) TABS tablet Take 1 tablet by mouth daily.  . Multiple Vitamins-Minerals (PRESERVISION AREDS 2) CAPS Take 1 capsule by mouth daily.  . OXYGEN Inhale 2 L into the lungs daily.  . pantoprazole (PROTONIX) 40 MG tablet Take 1 tablet (40 mg total) by mouth daily.  . potassium chloride (K-DUR) 10 MEQ tablet Take 10 mEq by mouth 2 (two) times daily.  [name] rOPINIRole (REQUIP) 4 MG tablet Take 4 mg by mouth at bedtime.  . Tamsulosin HCl (FLOMAX) 0.4 MG CAPS Take 0.4 mg by mouth daily after breakfast.  . triamcinolone cream (KENALOG) 0.1 % Apply 1 application topically 2 (two) times daily.    No facility-administered encounter medications on file as of 05/12/2018.     Review of Systems  GENERAL: No  change in appetite, no fatigue, no weight changes, no fever, chills  MOUTH and THROAT: Denies oral discomfort, gingival pain or bleeding RESPIRATORY: no cough, SOB, DOE, wheezing, hemoptysis CARDIAC: No chest pain, edema or palpitations GI: No abdominal pain, diarrhea, constipation, heart burn, nausea or vomiting GU: Denies dysuria, frequency, hematuria, incontinence, or discharge PSYCHIATRIC: Denies feelings of depression or anxiety. No report of hallucinations, insomnia, paranoia, or agitation   Immunization History  Administered Date(s) Administered  . Influenza, High Dose Seasonal PF 05/05/2018  . Tdap 10/15/2012, 01/04/2017   Pertinent  Health Maintenance Due  Topic Date Due  . FOOT EXAM  10/07/1941  . OPHTHALMOLOGY EXAM  10/07/1941  . PNA vac Low Risk Adult (1 of 2 - PCV13) 10/06/1998  . HEMOGLOBIN A1C  07/14/2018  . URINE MICROALBUMIN  01/10/2019  . INFLUENZA VACCINE  Completed    Vitals:   05/12/18 1547  BP: 119/64  Pulse: 70  Resp: 17  Temp: (!) 97.5 F (36.4 C)  TempSrc: Oral  SpO2: 96%  Weight: 175 lb 9.6 oz (79.7 kg)  Height: '5\' 7"'  (1.702 m)   Body mass index is 27.5 kg/m.  Physical Exam  GENERAL APPEARANCE: Well nourished. In no acute distress. Normal body habitus SKIN:  Skin is warm and dry.  MOUTH and THROAT: Lips are without lesions. Oral mucosa is moist and without lesions.  RESPIRATORY: Breathing is even & unlabored, BS CTAB, O2 '@2L' /min via [place] CARDIAC: RRR, no murmur, no extra heart sounds, no edema, left chest pacemaker GI: Abdomen soft, normal BS, no masses, no tenderness EXTREMITIES: Able to move X 4 extremities NEUROLOGICAL: There is no tremor. Speech is clear PSYCHIATRIC: Alert to place and self, disoriented to time. Affect and behavior are appropriate   Labs reviewed: Recent Labs    09/30/17 1542  11/03/17 1344  01/12/18 0931  05/09/18 0350 05/10/18 0408 05/11/18 0506  NA 141   < >  --    < >  --    < > 138 138 138  K 3.6   < >  --     < >  --    < > 4.5 4.4 4.0  CL 97*   < >  --    < >  --    < > 89* 90* 92*  CO2 30   < >  --    < >  --    < > 44* 43* 41*  GLUCOSE 76   < >  --    < >  --    < > 98 110* 94  BUN 53*   < >  --    < >  --    < >  60* 57* 53*  CREATININE 1.37*   < >  --    < >  --    < > 1.34* 1.38* 1.29*  CALCIUM 8.9   < >  --    < >  --    < > 9.0 9.0 8.7*  MG 2.1  --  2.2  --  2.2  --   --   --   --   PHOS  --   --   --    < >  --    < > 3.5 4.4 4.1   < > = values in this interval not displayed.   Recent Labs    01/13/18 0523 01/14/18 0756  05/05/18 0749  05/09/18 0350 05/10/18 0408 05/11/18 0506  AST 104* 75*  --  27  --   --   --   --   ALT 117* 104*  --  35  --   --   --   --   ALKPHOS 91 90  --  74  --   --   --   --   BILITOT 0.8 0.8  --  1.3*  --   --   --   --   PROT 6.7 6.4*  --  6.4*  --   --   --   --   ALBUMIN 3.1* 2.9*   < > 2.9*   < > 2.9* 2.9* 3.0*   < > = values in this interval not displayed.   Recent Labs    05/08/18 0409 05/09/18 0350 05/11/18 0506  WBC 3.3* 2.7* 2.9*  NEUTROABS 2.0 1.4* 1.6*  HGB 8.5* 8.4* 10.5*  HCT 27.0* 26.8* 32.1*  MCV 115.9* 115.0* 108.8*  PLT 109* 96* 92*   Lab Results  Component Value Date   TSH 3.099 11/03/2017   Lab Results  Component Value Date   HGBA1C 5.2 01/12/2018   Lab Results  Component Value Date   CHOL 117 11/04/2017   HDL 41 11/04/2017   LDLCALC 64 11/04/2017   TRIG 61 11/04/2017   CHOLHDL 2.9 11/04/2017    Significant Diagnostic Results in last 30 days:  Dg Chest 2 View  Result Date: 05/03/2018 CLINICAL DATA:  84 year old male with CHF. EXAM: CHEST - 2 VIEW COMPARISON:  Chest radiograph dated 03/30/2018 FINDINGS: There is cardiomegaly with mild vascular congestion and probable mild edema. No focal consolidation, pleural effusion, or pneumothorax. There is atherosclerotic calcification of the aorta. The aorta is tortuous. Left pectoral pacemaker device. Osteopenia with degenerative changes of the spine. No acute  osseous pathology. IMPRESSION: Cardiomegaly with mild vascular congestion and probable mild interstitial edema. Electronically Signed   By: Anner Crete M.D.   On: 05/03/2018 04:53   Dg Shoulder Right  Result Date: 05/09/2018 CLINICAL DATA:  Pain EXAM: RIGHT SHOULDER - 2+ VIEW COMPARISON:  04/05/2017 FINDINGS: Probable prior distal clavicle resection. Anchors noted in the humeral head. Advanced arthritic changes in the right glenohumeral joint with spurring, joint space loss and loss of subacromial space. No fracture, subluxation or dislocation. IMPRESSION: Advanced arthritic changes in the right shoulder, postoperative changes. No acute bony abnormality. Electronically Signed   By: Rolm Baptise M.D.   On: 05/09/2018 09:37   US Renal  Result Date: 05/05/2018 CLINICAL DATA:  Acute renal injury EXAM: RENAL / URINARY TRACT ULTRASOUND COMPLETE COMPARISON:  01/10/2018 FINDINGS: Right Kidney: Length: 12.6 cm. Multiple cysts are identified. The largest of these measures 3.9 cm in greatest dimension and stable from  the prior exam. No hydronephrosis is noted. Left Kidney: Length: 13.2 cm. Multiple cysts are noted. The largest of these measures 6.8 cm in greatest dimension also stable from the previous exam. No hydronephrosis is noted. Bladder: Decompressed Small pleural effusions are noted bilaterally. Minimal ascites is seen as well. IMPRESSION: Bilateral renal cysts stable from the prior CT examination. Bilateral pleural effusions and mild ascites. Electronically Signed   By: Inez Catalina M.D.   On: 05/05/2018 06:12   Ct Bone Marrow Biopsy & Aspiration  Result Date: 05/08/2018 INDICATION: 84 year old male with a history of anemia EXAM: CT BONE MARROW BIOPSY AND ASPIRATION MEDICATIONS: None. ANESTHESIA/SEDATION: Moderate (conscious) sedation was employed during this procedure. A total of Versed 1.0 mg and Fentanyl 50 mcg was administered intravenously. Moderate Sedation Time: 10 minutes. The patient's level  of consciousness and vital signs were monitored continuously by radiology nursing throughout the procedure under my direct supervision. FLUOROSCOPY TIME:  CT COMPLICATIONS: None PROCEDURE: The procedure risks, benefits, and alternatives were explained to the patient. Questions regarding the procedure were encouraged and answered. The patient understands and consents to the procedure. Scout CT of the pelvis was performed for surgical planning purposes. The posterior pelvis was prepped with Chlorhexidine in a sterile fashion, and a sterile drape was applied covering the operative field. A sterile gown and sterile gloves were used for the procedure. Local anesthesia was provided with 1% Lidocaine. Posterior iliac bone was targeted for biopsy. The skin and subcutaneous tissues were infiltrated with 1% lidocaine without epinephrine. A small stab incision was made with an 11 blade scalpel, and an 11 gauge Murphy needle was advanced with CT guidance to the posterior cortex. Manual forced was used to advance the needle through the posterior cortex and the stylet was removed. A bone marrow aspirate was retrieved and passed to a cytotechnologist in the room. The Murphy needle was then advanced without the stylet for a core biopsy. The core biopsy was retrieved and also passed to a cytotechnologist. Manual pressure was used for hemostasis and a sterile dressing was placed. No complications were encountered no significant blood loss was encountered. Patient tolerated the procedure well and remained hemodynamically stable throughout. IMPRESSION: Status post CT-guided bone marrow biopsy, with tissue specimen sent to pathology for complete histopathologic analysis Signed, Dulcy Fanny. Earleen Newport, DO [facility] Electronically Signed   By: Corrie Mckusick D.O.   On: 05/08/2018 11:16    Assessment/Plan  1. Acute on chronic respiratory failure with hypoxia and hypercapnia (HCC) -   Secondary to acute chronic systolic CHF and OSA, was treated with IV Lasix and Metolazone, agreed to use CPAP, continue O2 @ 2L/min via [place]   2. Acute on chronic systolic congestive heart failure ([place]) - was treated with IV Lasix and Metolazone, EF of 25-30%, continue Lasix 80 mg twice a day, weigh daily, notify provider for weight gain 3 lbs in a day/week, follow-up with cardiology on 05/19/2018   3. AKI (acute kidney injury) ([place]) - thought to be related to heart failure and nephrology was consulted, will re-check BMP Lab Results  Component Value Date   CREATININE 1.29 (H) 05/11/2018    4. PAF (paroxysmal atrial fibrillation) (HCC) - rate-controlled, has biventricular pacemaker, not on anticoagulation due to GI bleed history   5. MDS (myelodysplastic syndrome), high grade (HCC) -Dr. Marin Olp was consulted and underwent bone marrow biopsy on 9/30 with results suspicious for MDS, was ordered to have 2 units packed RBC transfusion on 05/10/2018,  not on Aranesp according to discharge summary, will re-check CBC, follow-up with Dr. Marin Olp, in 4 weeks   6. Benign prostatic hyperplasia, unspecified whether lower urinary tract symptoms present -continue Flomax 0.4 mg 1 capsule daily and Avodart 0.5 mg 1 soft gel daily   7. OSA (obstructive sleep apnea) - agreed to use CPAP   8. Recurrent Depression -continue Lexapro 5 mg 1 tab nightly and Remeron 15 mg 1 tab nightly   9. RLS -continue Requip XL 4 mg 1 tab nightly   10. History of GI Bleed -continue Protonix 40 mg 1 tab daily     Family/ staff Communication: Discussed plan of care with patient.  Labs/tests ordered:  CBC, BMP  Goals of care:   Short-term rehabilitation.   Durenda Age, NP [facility] [phone] (Monday-Friday 8:00 a.m. - 5:00 p.m.) [phone] (after hours)

## 2018-05-12 NOTE — Telephone Encounter (Signed)
Pt is in nursing home right now for therapy. Will be transferred to [facility]. Wife will make sure that he come to scheduled appt

## 2018-05-15 LAB — BASIC METABOLIC PANEL
BUN: 47 — AB (ref 4–21)
CREATININE: 1.2 (ref 0.6–1.3)
Glucose: 82
Potassium: 4.4 (ref 3.4–5.3)
Sodium: 145 (ref 137–147)

## 2018-05-15 LAB — CBC AND DIFFERENTIAL
HCT: 33 — AB (ref 41–53)
Hemoglobin: 11.2 — AB (ref 13.5–17.5)
Neutrophils Absolute: 2
PLATELETS: 92 — AB (ref 150–399)
WBC: 3.3

## 2018-05-16 ENCOUNTER — Non-Acute Institutional Stay (SKILLED_NURSING_FACILITY): Payer: Medicare Other | Admitting: Internal Medicine

## 2018-05-16 ENCOUNTER — Encounter: Payer: Self-pay | Admitting: Internal Medicine

## 2018-05-16 DIAGNOSIS — I5023 Acute on chronic systolic (congestive) heart failure: Secondary | ICD-10-CM | POA: Diagnosis not present

## 2018-05-16 DIAGNOSIS — R5383 Other fatigue: Secondary | ICD-10-CM

## 2018-05-16 DIAGNOSIS — J9621 Acute and chronic respiratory failure with hypoxia: Secondary | ICD-10-CM

## 2018-05-16 NOTE — Assessment & Plan Note (Signed)
Clinically stable w/o decompensation
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
## 2018-05-16 NOTE — Patient Instructions (Signed)
See assessment and plan under each diagnosis in the problem list and acutely for this visit 

## 2018-05-16 NOTE — Assessment & Plan Note (Addendum)
D/C hydroxyzine Loratadine 10 mg daily as needed for itching Update TSH

## 2018-05-16 NOTE — Assessment & Plan Note (Signed)
Clinically compensated O2 sats good on supplemental oxygen

## 2018-05-17 LAB — TSH: TSH: 2.95 (ref 0.41–5.90)

## 2018-05-18 ENCOUNTER — Encounter (HOSPITAL_COMMUNITY): Payer: Self-pay | Admitting: Hematology & Oncology

## 2018-05-19 ENCOUNTER — Ambulatory Visit (INDEPENDENT_AMBULATORY_CARE_PROVIDER_SITE_OTHER): Payer: Medicare Other | Admitting: Physician Assistant

## 2018-05-19 ENCOUNTER — Encounter: Payer: Self-pay | Admitting: Physician Assistant

## 2018-05-19 VITALS — BP 98/50 | HR 69 | Ht 67.0 in | Wt 183.4 lb

## 2018-05-19 DIAGNOSIS — I10 Essential (primary) hypertension: Secondary | ICD-10-CM | POA: Diagnosis not present

## 2018-05-19 DIAGNOSIS — E785 Hyperlipidemia, unspecified: Secondary | ICD-10-CM

## 2018-05-19 DIAGNOSIS — J449 Chronic obstructive pulmonary disease, unspecified: Secondary | ICD-10-CM | POA: Diagnosis not present

## 2018-05-19 DIAGNOSIS — G2581 Restless legs syndrome: Secondary | ICD-10-CM

## 2018-05-19 DIAGNOSIS — I480 Paroxysmal atrial fibrillation: Secondary | ICD-10-CM

## 2018-05-19 DIAGNOSIS — R5381 Other malaise: Secondary | ICD-10-CM

## 2018-05-19 DIAGNOSIS — N183 Chronic kidney disease, stage 3 unspecified: Secondary | ICD-10-CM

## 2018-05-19 DIAGNOSIS — D469 Myelodysplastic syndrome, unspecified: Secondary | ICD-10-CM

## 2018-05-19 DIAGNOSIS — I5042 Chronic combined systolic (congestive) and diastolic (congestive) heart failure: Secondary | ICD-10-CM | POA: Diagnosis not present

## 2018-05-19 DIAGNOSIS — Z950 Presence of cardiac pacemaker: Secondary | ICD-10-CM

## 2018-05-19 DIAGNOSIS — I2510 Atherosclerotic heart disease of native coronary artery without angina pectoris: Secondary | ICD-10-CM

## 2018-05-19 DIAGNOSIS — I428 Other cardiomyopathies: Secondary | ICD-10-CM | POA: Diagnosis not present

## 2018-05-19 DIAGNOSIS — J9611 Chronic respiratory failure with hypoxia: Secondary | ICD-10-CM | POA: Diagnosis not present

## 2018-05-19 DIAGNOSIS — Z9981 Dependence on supplemental oxygen: Secondary | ICD-10-CM

## 2018-05-19 DIAGNOSIS — G4733 Obstructive sleep apnea (adult) (pediatric): Secondary | ICD-10-CM

## 2018-05-19 MED ORDER — FUROSEMIDE 40 MG PO TABS: 40.0000 mg | ORAL_TABLET | Freq: Every day | ORAL | 1 refills | Status: DC

## 2018-05-19 NOTE — Progress Notes (Signed)
Thank you MCr 

## 2018-05-19 NOTE — Progress Notes (Signed)
Cardiology Office Note    Date:  05/19/2018   ID:  Anthony Agar Sr., DOB [birth year], MRN [id number]  PCP:  [name], MD  Cardiologist:  Dr. [name]  Primary electrophysiologist: Dr. [name]  Chief Complaint  Patient presents with  . Follow-up    seen for Dr. [name].     History of Present Illness:  Anthony FRIX Sr. is a 84 y.o. male with PMH of CKD stage III, afib, HTN, HLD, MDS, NICM, symptomatic bradycardia s/p [employer] CRT-P, COPD on home O2, OSA, RLS and pulmonary HTN. Patient had a long-standing history of nonischemic cardiomyopathy, EF was 40 to 45% in 2010.  She underwent cardiac catheterization which showed normal coronaries with right dominated system.  Patient has a history of atrial fibrillation with slow ventricular rate. She had a single chamber maker placed in March 2013.  Echocardiogram in both 2016 and 2018 shows normal ejection fraction.  In February 2019, patient was admitted for acute on chronic systolic heart failure.  Echocardiogram obtained on 09/21/2017 showed EF down to 25 to 30%, mild MR, peak PA pressure of 111 mmHg.  Patient was admitted in March 2019 for symptomatic anemia.  He required blood transfusion.  EGD showed angiodysplastic lesion in the gastric antrum and duodenum.  His Eliquis was finally discontinued in June 2019 after it was felt he is too high a risk for bleeding.  Repeat echocardiogram obtained on 01/14/2015 showed EF continue to be down at 25 to 30%, akinesis of the mid apical anteroseptal and inferoseptal myocardium, mild aortic regurgitation, mild MR, severe LAE and RAE, peak PA pressure 53 mmHg.  In Aug 2019, patient underwent CRT-P upgrade by Dr. [name].    More recently, patient presented to the hospital on 05/03/2018 for scrotal edema as well as shortness of breath and abdominal edema.  He apparently went on a day cruise along with his family.  His weight increased from 179 pounds to 191 pounds.  BNP was elevated at 1865.  Troponin  was borderline elevated.  Creatinine also trended up from a baseline of 1.4 up to 1.94.  Patient was placed on IV diuresis.  Patient was also seen by Dr. Marin Olp during the hospitalization for pancytopenia, this was felt to be secondary to myelodysplastic syndrome.  His discharge weight was 179 pounds.  Patient presents today for cardiology office visit.  His weight is 183 pounds.  However his breathing is okay.  There is been some mild crackle in the base of the lung, this is likely atelectasis.  He appears to be euvolemic on physical exam.  Family states that he has gained 10 pounds in the last week, however I do not think he is significantly volume overloaded.  I recommend that his current weight is the dry weight.  If his weight increased by more than 3 pounds overnight or 5 pounds in single week, I recommend him to take extra 40 mg of Lasix.   Past Medical History:  Diagnosis Date  . Arthritis    "feet" (09/30/2017)  . CHF (congestive heart failure) ([place])   . Chronic bronchitis ([place])   . CKD (chronic kidney disease), stage III ([place])    [name] 09/30/2017  . Coronary artery disease   . Diverticulitis   . Dyspnea   . GERD (gastroesophageal reflux disease)   . Gout   . History of blood transfusion    "blood loss" (09/30/2017)  . Hyperlipidemia   . Hypertension   . MDS (myelodysplastic syndrome),  high grade ([place]) 05/11/2018  . MI (myocardial infarction) ([place]) ~ 2000   "light one"  . Nonischemic cardiomyopathy ([facility])    mild  . OSA on CPAP   . Pacemaker single chamber, [name], 2013 10/13/2011  . Permanent atrial fibrillation    on Eliquis  . Pulmonary hypertension ([place])   . Restless legs     Past Surgical History:  Procedure Laterality Date  . APPENDECTOMY  12/2000   [name] 12/22/2010  . BIV UPGRADE N/A 02/22/2018   Procedure: BIV UPGRADE;  Surgeon: [name], MD;  Location: [place] [facility];  Service: Cardiovascular;  Laterality: N/A;  . BIV UPGRADE N/A  03/29/2018   Procedure: BIV PPM UPGRADE;  Surgeon: [name], MD;  Location: [place] [facility];  Service: Cardiovascular;  Laterality: N/A;  . CARDIAC CATHETERIZATION  11/07/2008   nonischemic cardiomyopathy,pulmonary hypertension  . CATARACT EXTRACTION W/ INTRAOCULAR LENS  IMPLANT, BILATERAL Bilateral   . CIRCUMCISION  12/2005   [name] 12/22/2010  . COLOSTOMY  12/2000   [name] 12/22/2010  . COLOSTOMY REVERSAL  07/2001   [name] 12/22/2010  . CORONARY ANGIOPLASTY  06/01/1999   successful to ostium of the first diagonal  . ESOPHAGOGASTRODUODENOSCOPY N/A 08/22/2013   Procedure: ESOPHAGOGASTRODUODENOSCOPY (EGD);  Surgeon: [name], MD;  Location: [facility] ENDOSCOPY;  Service: Endoscopy;  Laterality: N/A;  h/p in file cabinet, jackie  . ESOPHAGOGASTRODUODENOSCOPY N/A 11/05/2014   Procedure: ESOPHAGOGASTRODUODENOSCOPY (EGD);  Surgeon: Clarene Essex, MD;  Location: [facility] ENDOSCOPY;  Service: Endoscopy;  Laterality: N/A;  . ESOPHAGOGASTRODUODENOSCOPY N/A 11/08/2016   Procedure: ESOPHAGOGASTRODUODENOSCOPY (EGD);  Surgeon: [name], MD;  Location: [facility] ENDOSCOPY;  Service: Endoscopy;  Laterality: N/A;  . ESOPHAGOGASTRODUODENOSCOPY (EGD) WITH PROPOFOL Left 11/05/2017   Procedure: ESOPHAGOGASTRODUODENOSCOPY (EGD) WITH PROPOFOL;  Surgeon: [name], MD;  Location: [place];  Service: Endoscopy;  Laterality: Left;  . HOT HEMOSTASIS N/A 11/05/2014   Procedure: HOT HEMOSTASIS (ARGON PLASMA COAGULATION/BICAP);  Surgeon: Clarene Essex, MD;  Location: [facility] ENDOSCOPY;  Service: Endoscopy;  Laterality: N/A;  . INSERT / REPLACE / REMOVE PACEMAKER    . JOINT REPLACEMENT    . MASS EXCISION Left    hand w/ulnar artery reconstruction/notes 12/22/2010  . NM MYOCAR PERF WALL MOTION  11/24/2007   normal  . PERMANENT PACEMAKER INSERTION  10/04/2012   [employer]  . PERMANENT PACEMAKER INSERTION N/A 10/12/2011   Procedure: PERMANENT PACEMAKER INSERTION;  Surgeon: [name], MD;  Location: [place] CATH LAB;  Service:  Cardiovascular;  Laterality: N/A;  . REPLACEMENT TOTAL KNEE Bilateral 11/2006   right-left/notes 12/22/2010  . ROTATOR CUFF REPAIR Right 09/2004   [name] 12/22/2010  . SAVORY DILATION N/A 08/22/2013   Procedure: SAVORY DILATION;  Surgeon: [name], MD;  [facility] ENDOSCOPY;  Service: Endoscopy;  Laterality: N/A;  . SHOULDER SURGERY Right    "fell off house; messed up 3 things in my arm"  . TONSILLECTOMY    . US ECHOCARDIOGRAPHY  02/01/2011   LA is mod-severely dilated,AOV & root sclerotic,ca+ AOV leaflets    Current Medications: Outpatient Medications Prior to Visit  Medication Sig Dispense Refill  . allopurinol (ZYLOPRIM) 100 MG tablet Take 2 tablets (200 mg total) by mouth daily. 30 tablet 1  . aspirin EC 81 MG EC tablet Take 1 tablet (81 mg total) by mouth daily.    . diphenhydrAMINE-zinc acetate (BENADRYL) cream Apply topically 2 (two) times daily as needed for itching. 28.4 g 0  . dutasteride (AVODART) 0.5 MG capsule Take  0.5 mg by mouth daily.    [name] escitalopram (LEXAPRO) 5 MG tablet Take 1 tablet (5 mg total) by mouth at bedtime. 30 tablet 0  . furosemide (LASIX) 80 MG tablet Take 1 tablet (80 mg total) by mouth 2 (two) times daily. 180 tablet 1  . hydrOXYzine (ATARAX/VISTARIL) 10 MG tablet Take 1 tablet (10 mg total) by mouth 3 (three) times daily as needed for itching. 30 tablet 0  . ipratropium-albuterol (DUONEB) 0.5-2.5 (3) MG/3ML SOLN Take 3 mLs by nebulization every 4 (four) hours as needed. 360 mL   . loratadine (CLARITIN) 10 MG tablet Take 10 mg by mouth daily as needed for allergies or itching.    . mirtazapine (REMERON) 15 MG tablet Take 15 mg by mouth at bedtime.    . Multiple Vitamin (MULTIVITAMIN WITH MINERALS) TABS tablet Take 1 tablet by mouth daily.    . Multiple Vitamins-Minerals (PRESERVISION AREDS 2) CAPS Take 1 capsule by mouth daily.    . OXYGEN Inhale 2 L into the lungs daily.    . pantoprazole (PROTONIX) 40 MG tablet Take 1 tablet (40 mg total) by mouth  daily. 60 tablet 0  . potassium chloride (K-DUR) 10 MEQ tablet Take 10 mEq by mouth 2 (two) times daily.    [name] rOPINIRole (REQUIP) 4 MG tablet Take 4 mg by mouth at bedtime.    . Tamsulosin HCl (FLOMAX) 0.4 MG CAPS Take 0.4 mg by mouth daily after breakfast.    . triamcinolone cream (KENALOG) 0.1 % Apply 1 application topically 2 (two) times daily.   0   No facility-administered medications prior to visit.      Allergies:   Vicodin [hydrocodone-acetaminophen]   Social History   Socioeconomic History  . Marital status: Married    Spouse name: Not on file  . Number of children: Not on file  . Years of education: Not on file  . Highest education level: Not on file  Occupational History  . Occupation: retired  Scientific laboratory technician  . Financial resource strain: Not on file  . Food insecurity:    Worry: Not on file    Inability: Not on file  . Transportation needs:    Medical: Not on file    Non-medical: Not on file  Tobacco Use  . Smoking status: Former Research scientist (life sciences)  . Smokeless tobacco: Never Used  . Tobacco comment: 09/30/2017 "it's been over 6yr since I smoked anything"  Substance and Sexual Activity  . Alcohol use: No  . Drug use: No  . Sexual activity: Not Currently  Lifestyle  . Physical activity:    Days per week: Not on file    Minutes per session: Not on file  . Stress: Not on file  Relationships  . Social connections:    Talks on phone: Not on file    Gets together: Not on file    Attends religious service: Not on file    Active member of club or organization: Not on file    Attends meetings of clubs or organizations: Not on file    Relationship status: Not on file  Other Topics Concern  . Not on file  Social History Narrative  . Not on file     Family History:  The patient's family history includes Cancer in his mother; Diabetes in his brother; Heart attack in his father.   ROS:   Please see the history of present illness.    ROS All other systems reviewed and are  negative.   PHYSICAL  EXAM:   VS:  BP (!) 98/50   Pulse 69   Ht 5\' 7"  (1.702 m)   Wt 183 lb 6.4 oz (83.2 kg)   BMI 28.72 kg/m    GEN: chronically ill appearing, sitting in wheelchair HEENT: normal  Neck: no JVD, carotid bruits, or masses Cardiac: RRR; no murmurs, rubs, or gallops,no edema  Respiratory:  clear to auscultation bilaterally, normal work of breathing GI: soft, nontender, nondistended, + BS MS: no deformity or atrophy  Skin: warm and dry, no rash Neuro:  Alert and Oriented x 3, Strength and sensation are intact Psych: euthymic mood, full affect  Wt Readings from Last 3 Encounters:  05/19/18 183 lb 6.4 oz (83.2 kg)  05/16/18 181 lb 9.6 oz (82.4 kg)  05/12/18 175 lb 9.6 oz (79.7 kg)      Studies/Labs Reviewed:   EKG:  EKG is not ordered today.   Recent Labs: 11/03/2017: TSH 3.099 01/12/2018: Magnesium 2.2 05/03/2018: B Natriuretic Peptide 1,865.0 05/05/2018: ALT 35 05/15/2018: BUN 47; Creatinine 1.2; Hemoglobin 11.2; Platelets 92; Potassium 4.4; Sodium 145   Lipid Panel    Component Value Date/Time   CHOL 117 11/04/2017 0458   TRIG 61 11/04/2017 0458   HDL 41 11/04/2017 0458   CHOLHDL 2.9 11/04/2017 0458   VLDL 12 11/04/2017 0458   LDLCALC 64 11/04/2017 0458    Additional studies/ records that were reviewed today include:   Cath 11/07/2008 CORONARY ARTERIOGRAPHY:  1. Left main was normal and bifurcated.  2. Circumflex:  The circumflex was basically a hybrid system.  It gave      rise to a very large ongoing circumflex.  It gave rise to a      marginal vessel at the terminal portion of this.  There were 2      medium-sized OMs that came off very proximal.  These vessels were      free of disease.  3. LAD:  The LAD extended down to the apex of the heart.  It was free      of disease.  The first diagonal was free of disease.  4. Right coronary artery:  The right coronary was a large dominant      vessel.  There was no high-grade stenosis.  There was no  narrowing      in the right coronary artery.  The PDA was large, free of disease      as was posterolateral vessel.   HEMODYNAMIC MONITORING:  1. Right atrial pressure 11, right ventricular pressure 42/11,      pulmonary artery pressure 43/21, wedge 21, central aortic pressure      131/64, left ventricular pressure 128/13 with no significant valve      gradient noted at the time of pullback.  2. Oxygen saturation on 2 L 100% in the AO and 57% in the PA.   Cardiac output by thermodilution 4.1, Fick 3.6.  Cardiac index  thermodilution 1.8, Fick 1.6.   ASSESSMENT:  1. Nonischemic cardiomyopathy with ejection fraction of 40-45%.  2. Pulmonary hypertension with pulmonary artery pressure of 43/21.  3. Chronic atrial fibrillation.  4. Moderate-to-severe obstructive sleep apnea with uncontrollable leg      movement.   My plan is to restart him on Coumadin and consider Lovenox as a bridge.  If his insurance will allow and his family can administer this, he can  be discharged to home late today or tomorrow.  Otherwise, he will be  here until his INR is therapeutic.  Echo 01/13/2018 LV EF: 25% -   30% Study Conclusions  - Left ventricle: The cavity size was normal. Systolic function was   severely reduced. The estimated ejection fraction was in the   range of 25% to 30%. There is akinesis of the   mid-apicalanteroseptal and inferoseptal myocardium. The study is   not technically sufficient to allow evaluation of LV diastolic   function. - Aortic valve: Trileaflet; moderately thickened, moderately   calcified leaflets. Transvalvular velocity was minimally   increased. There was no stenosis. There was mild regurgitation.   Peak velocity (S): 233 cm/s. Valve area (VTI): 1.49 cm^2. Valve   area (Vmax): 1.6 cm^2. Valve area (Vmean): 1.44 cm^2. - Mitral valve: There was mild regurgitation. - Left atrium: The atrium was severely dilated. - Right ventricle: The cavity size was moderately  dilated. Wall   thickness was normal. Pacer wire or catheter noted in right   ventricle. - Right atrium: The atrium was severely dilated. - Tricuspid valve: There was moderate regurgitation. - Pulmonary arteries: Systolic pressure was moderately increased.   PA peak pressure: 53 mm Hg (S).   ASSESSMENT:    1. Chronic combined systolic and diastolic heart failure ([place])   2. CKD (chronic kidney disease), stage III ([place])   3. PAF (paroxysmal atrial fibrillation) ([place])   4. NICM (nonischemic cardiomyopathy) ([place])   5. Essential hypertension   6. Hyperlipidemia, unspecified hyperlipidemia type   7. Myelodysplastic syndrome ([place])   8. Presence of cardiac resynchronization therapy pacemaker (CRT-P)   9. Chronic obstructive pulmonary disease, unspecified COPD type ([place])   10. Chronic respiratory failure with hypoxia, on home O2 therapy ([facility])   11. RLS (restless legs syndrome)   12. OSA (obstructive sleep apnea)   13. Physical deconditioning      PLAN:  In order of problems listed above:  1. Chronic combined systolic and diastolic heart failure: EF 25%, he appears to be euvolemic on physical exam even though his weight is 183 pounds.  His discharge weight was 179 pounds.  We will continue on current therapy, he has been instructed to take additional 40 mg of Lasix on a as needed basis if weight increased by more than 3 pounds overnight or 5 pounds in single week.  Otherwise, he is to maintain on 80 mg twice daily of Lasix.  Her recent lab work shows stable renal function and electrolyte.  Patient will start on home hospice soon.  Long-term prognosis fairly poor given multiple comorbidities.  2. PAF: His Eliquis was discontinued back in June due to significant bleeding risk.  He is no longer candidate for systemic anticoagulation therapy.  At this point, patient is not on any rate control therapy nor is he on any blood pressure medication  3. Hypertension: Blood pressure borderline low today,  however patient is currently not on any blood pressure medication.  We will continue on the current therapy.  4. Hyperlipidemia: Not on any statin.  5. Myelodysplastic syndrome: Was recently seen by Dr. Marin Olp in the hospital  6. COPD on home O2: Oxygen dependent 24/7 at this point  7. Restless leg syndrome: On Requip  8. Deconditioning: Although patient can still walk, he is largely sedentary at this point.  He is currently receiving physical therapy.  However long-term prognosis is poor.    Medication Adjustments/Labs and Tests Ordered: Current medicines are reviewed at length with the patient today.  Concerns regarding medicines are outlined above.  Medication changes, Labs and Tests ordered today are  listed in the Patient Instructions below. Patient Instructions  Medication Instructions:  Your physician recommends that you continue on your current medications as directed. Please refer to the Current Medication list given to you today.   If you need a refill on your cardiac medications before your next appointment, please call your pharmacy.   Lab work: None  If you have labs (blood work) drawn today and your tests are completely normal, you will receive your results only by: [name] MyChart Message (if you have MyChart) OR . A paper copy in the mail If you have any lab test that is abnormal or we need to change your treatment, we will call you to review the results.  Testing/Procedures: None   Follow-Up: At [facility], you and your health needs are our priority.  As part of our continuing mission to provide you with exceptional heart care, we have created designated Provider Care Teams.  These Care Teams include your primary Cardiologist (physician) and Advanced Practice Providers (APPs -  Physician Assistants and Nurse Practitioners) who all work together to provide you with the care you need, when you need it. Your POVIDER recommends that you schedule a follow-up appointment  in: 2-3 months with Dr [name].  Any Other Special Instructions Will Be Listed Below (If Applicable). YOUR NEW DRY WEIGHT IS 183 lbs in a day or 5lbs in a week IF YOU GAIN MORE THAN YOUR DRY WEIGHT THEN TAKE AN ADDITIONAL 40MG  OF Sheila Oats, [place]  05/19/2018 1:53 PM    [facility] [place], [place],   [address] Phone: [phone]; Fax: [phone]

## 2018-05-19 NOTE — Patient Instructions (Signed)
Medication Instructions:  Your physician recommends that you continue on your current medications as directed. Please refer to the Current Medication list given to you today.   If you need a refill on your cardiac medications before your next appointment, please call your pharmacy.   Lab work: None  If you have labs (blood work) drawn today and your tests are completely normal, you will receive your results only by: [name] MyChart Message (if you have MyChart) OR . A paper copy in the mail If you have any lab test that is abnormal or we need to change your treatment, we will call you to review the results.  Testing/Procedures: None   Follow-Up: At [facility], you and your health needs are our priority.  As part of our continuing mission to provide you with exceptional heart care, we have created designated Provider Care Teams.  These Care Teams include your primary Cardiologist (physician) and Advanced Practice Providers (APPs -  Physician Assistants and Nurse Practitioners) who all work together to provide you with the care you need, when you need it. Your POVIDER recommends that you schedule a follow-up appointment in: 2-3 months with Dr [name].  Any Other Special Instructions Will Be Listed Below (If Applicable). YOUR NEW DRY WEIGHT IS 183 lbs in a day or 5lbs in a week IF YOU GAIN MORE THAN YOUR DRY WEIGHT THEN TAKE AN ADDITIONAL 40MG  OF LASIX

## 2018-05-23 ENCOUNTER — Inpatient Hospital Stay: Payer: PRIVATE HEALTH INSURANCE

## 2018-05-23 ENCOUNTER — Other Ambulatory Visit: Payer: Self-pay | Admitting: Family

## 2018-05-23 ENCOUNTER — Other Ambulatory Visit: Payer: Self-pay

## 2018-05-23 ENCOUNTER — Inpatient Hospital Stay: Payer: PRIVATE HEALTH INSURANCE | Attending: Hematology & Oncology | Admitting: Hematology & Oncology

## 2018-05-23 VITALS — BP 94/54 | HR 76 | Temp 98.6°F | Resp 17 | Wt 187.4 lb

## 2018-05-23 DIAGNOSIS — Z79899 Other long term (current) drug therapy: Secondary | ICD-10-CM | POA: Insufficient documentation

## 2018-05-23 DIAGNOSIS — N189 Chronic kidney disease, unspecified: Secondary | ICD-10-CM | POA: Insufficient documentation

## 2018-05-23 DIAGNOSIS — J449 Chronic obstructive pulmonary disease, unspecified: Secondary | ICD-10-CM | POA: Insufficient documentation

## 2018-05-23 DIAGNOSIS — D46Z Other myelodysplastic syndromes: Secondary | ICD-10-CM

## 2018-05-23 DIAGNOSIS — Z9981 Dependence on supplemental oxygen: Secondary | ICD-10-CM | POA: Insufficient documentation

## 2018-05-23 DIAGNOSIS — D469 Myelodysplastic syndrome, unspecified: Secondary | ICD-10-CM | POA: Diagnosis not present

## 2018-05-23 DIAGNOSIS — Z7982 Long term (current) use of aspirin: Secondary | ICD-10-CM | POA: Diagnosis not present

## 2018-05-23 DIAGNOSIS — D509 Iron deficiency anemia, unspecified: Secondary | ICD-10-CM | POA: Diagnosis not present

## 2018-05-23 DIAGNOSIS — D649 Anemia, unspecified: Secondary | ICD-10-CM

## 2018-05-23 DIAGNOSIS — I2510 Atherosclerotic heart disease of native coronary artery without angina pectoris: Secondary | ICD-10-CM | POA: Diagnosis not present

## 2018-05-23 DIAGNOSIS — D61818 Other pancytopenia: Secondary | ICD-10-CM

## 2018-05-23 LAB — CBC WITH DIFFERENTIAL (CANCER CENTER ONLY)
ABS IMMATURE GRANULOCYTES: 0.01 10*3/uL (ref 0.00–0.07)
Basophils Absolute: 0 10*3/uL (ref 0.0–0.1)
Basophils Relative: 1 %
EOS PCT: 2 %
Eosinophils Absolute: 0.1 10*3/uL (ref 0.0–0.5)
HEMATOCRIT: 32.6 % — AB (ref 39.0–52.0)
HEMOGLOBIN: 9.9 g/dL — AB (ref 13.0–17.0)
Immature Granulocytes: 0 %
LYMPHS ABS: 0.9 10*3/uL (ref 0.7–4.0)
LYMPHS PCT: 32 %
MCH: 34.3 pg — AB (ref 26.0–34.0)
MCHC: 30.4 g/dL (ref 30.0–36.0)
MCV: 112.8 fL — ABNORMAL HIGH (ref 80.0–100.0)
MONO ABS: 0.4 10*3/uL (ref 0.1–1.0)
Monocytes Relative: 14 %
Neutro Abs: 1.5 10*3/uL — ABNORMAL LOW (ref 1.7–7.7)
Neutrophils Relative %: 51 %
Platelet Count: 108 10*3/uL — ABNORMAL LOW (ref 150–400)
RBC: 2.89 MIL/uL — ABNORMAL LOW (ref 4.22–5.81)
RDW: 18.6 % — ABNORMAL HIGH (ref 11.5–15.5)
WBC Count: 2.9 10*3/uL — ABNORMAL LOW (ref 4.0–10.5)
nRBC: 0 % (ref 0.0–0.2)

## 2018-05-23 LAB — CMP (CANCER CENTER ONLY)
ALBUMIN: 3.2 g/dL — AB (ref 3.5–5.0)
ALK PHOS: 81 U/L (ref 26–84)
ALT: 23 U/L (ref 10–47)
AST: 30 U/L (ref 11–38)
Anion gap: 2 — ABNORMAL LOW (ref 5–15)
BUN: 46 mg/dL — ABNORMAL HIGH (ref 7–22)
CHLORIDE: 105 mmol/L (ref 98–108)
CO2: 35 mmol/L — ABNORMAL HIGH (ref 18–33)
Calcium: 9.2 mg/dL (ref 8.0–10.3)
Creatinine: 1.3 mg/dL — ABNORMAL HIGH (ref 0.60–1.20)
Glucose, Bld: 102 mg/dL (ref 73–118)
Potassium: 4.1 mmol/L (ref 3.3–4.7)
SODIUM: 142 mmol/L (ref 128–145)
TOTAL PROTEIN: 7.1 g/dL (ref 6.4–8.1)
Total Bilirubin: 0.9 mg/dL (ref 0.2–1.6)

## 2018-05-23 LAB — SAVE SMEAR (SSMR)

## 2018-05-23 LAB — RETICULOCYTES
IMMATURE RETIC FRACT: 16.6 % — AB (ref 2.3–15.9)
RBC.: 2.89 MIL/uL — ABNORMAL LOW (ref 4.22–5.81)
Retic Count, Absolute: 53.2 10*3/uL (ref 19.0–186.0)
Retic Ct Pct: 1.8 % (ref 0.4–3.1)

## 2018-05-23 MED ORDER — LENALIDOMIDE 5 MG PO CAPS: ORAL_CAPSULE | ORAL | 5 refills | Status: DC

## 2018-05-23 MED ORDER — DARBEPOETIN ALFA 300 MCG/0.6ML IJ SOSY
300.0000 ug | PREFILLED_SYRINGE | Freq: Once | INTRAMUSCULAR | Status: AC
  Administered 2018-05-23: 300 ug via SUBCUTANEOUS

## 2018-05-23 NOTE — Progress Notes (Signed)
Hematology and Oncology Follow Up Visit  [name] [id number] 20-Dec-1931 84 y.o. 05/23/2018   Principle Diagnosis:   Myelodysplasia -- Low grade -- 5q-/+7 cytogenetics  (IPSS score = 0.5 --Intermediate-1 risk)  Current Therapy:    Aranesp 327mg sq q 3 week  Revlimid 5 mg po q day (21 day on/7 day off)     Interim History:  [name] back for his first office visit.  I saw him in the hospital in consultation.  When I saw him, I felt that he likely had myelodysplasia.  We went ahead and did a bone marrow biopsy on him.  This was done on 05/08/2018.  The pathology report (([id number] showed hypercellular bone marrow with dyspoietic changes consistent with myelodysplasia.  He had no blasts.  Cytogenetics showed that he has the 5q-abnormality and also a trisomy 7 abnormality.  His erythropoietin level was 130.  He did have an episode of GI bleeding.  He was on Eliquis which has been stopped.  In the hospital, he was given Aranesp.  He also has some iron deficiency with an iron saturation of 12%.  Given that he has the 5q-abnormality, I think that we should try him on Revlimid.  I think this would be reasonable.  I think a 5 mg dose would be appropriate for him.  His performance status is quite poor.  He has multiple other health problems.  Is on numerous medications.  I talked to he and his wife and grandson about this.  I explained why I thought that realm it would be a reasonable choice for him.  Given that it is a pill, it would be easier for him to take.  I think the 5 mg dose really should not cause problems for him.  He has not noted any obvious bleeding.  He has had no diarrhea.  His leg swelling seems to be a little bit better.  He is on chronic oxygen.  He does have underlying COPD.  Currently, he is at a rehab center.  He might be there for another week or so.  Currently, his performance status is ECOG 3 at best.    Medications:  Current Outpatient  Medications:  .  allopurinol (ZYLOPRIM) 100 MG tablet, Take 2 tablets (200 mg total) by mouth daily., Disp: 30 tablet, Rfl: 1 .  aspirin EC 81 MG EC tablet, Take 1 tablet (81 mg total) by mouth daily., Disp: , Rfl:  .  diphenhydrAMINE-zinc acetate (BENADRYL) cream, Apply topically 2 (two) times daily as needed for itching., Disp: 28.4 g, Rfl: 0 .  dutasteride (AVODART) 0.5 MG capsule, Take 0.5 mg by mouth daily., Disp: , Rfl:  .  escitalopram (LEXAPRO) 5 MG tablet, Take 1 tablet (5 mg total) by mouth at bedtime., Disp: 30 tablet, Rfl: 0 .  furosemide (LASIX) 40 MG tablet, Take 1 tablet (40 mg total) by mouth daily. Take 1 tablet as needed for 3lbs gain in a day or 5lbs gain in a week, Disp: 30 tablet, Rfl: 1 .  furosemide (LASIX) 80 MG tablet, Take 1 tablet (80 mg total) by mouth 2 (two) times daily., Disp: 180 tablet, Rfl: 1 .  hydrOXYzine (ATARAX/VISTARIL) 10 MG tablet, Take 1 tablet (10 mg total) by mouth 3 (three) times daily as needed for itching., Disp: 30 tablet, Rfl: 0 .  ipratropium-albuterol (DUONEB) 0.5-2.5 (3) MG/3ML SOLN, Take 3 mLs by nebulization every 4 (four) hours as needed., Disp: 360 mL, Rfl:  .  mirtazapine (REMERON)  15 MG tablet, Take 15 mg by mouth at bedtime., Disp: , Rfl:  .  Multiple Vitamin (MULTIVITAMIN WITH MINERALS) TABS tablet, Take 1 tablet by mouth daily., Disp: , Rfl:  .  Multiple Vitamins-Minerals (PRESERVISION AREDS 2) CAPS, Take 1 capsule by mouth daily., Disp: , Rfl:  .  OXYGEN, Inhale 2 L into the lungs daily., Disp: , Rfl:  .  pantoprazole (PROTONIX) 40 MG tablet, Take 1 tablet (40 mg total) by mouth daily., Disp: 60 tablet, Rfl: 0 .  potassium chloride (K-DUR) 10 MEQ tablet, Take 10 mEq by mouth 2 (two) times daily., Disp: , Rfl:  .  rOPINIRole (REQUIP) 4 MG tablet, Take 4 mg by mouth at bedtime., Disp: , Rfl:  .  Tamsulosin HCl (FLOMAX) 0.4 MG CAPS, Take 0.4 mg by mouth daily after breakfast., Disp: , Rfl:  .  triamcinolone cream (KENALOG) 0.1 %, Apply 1  application topically 2 (two) times daily. , Disp: , Rfl: 0  Allergies:  Allergies  Allergen Reactions  . Vicodin [Hydrocodone-Acetaminophen] Itching and Nausea Only    Past Medical History, Surgical history, Social history, and Family History were reviewed and updated.  Review of Systems: Review of Systems  Constitutional: Positive for appetite change and fatigue.  HENT:  Negative.   Eyes: Negative.   Respiratory: Positive for shortness of breath.   Cardiovascular: Positive for leg swelling and palpitations.  Gastrointestinal: Positive for constipation.  Endocrine: Negative.   Genitourinary: Positive for frequency.   Musculoskeletal: Positive for arthralgias.  Skin: Negative.   Neurological: Negative.   Hematological: Bruises/bleeds easily.  Psychiatric/Behavioral: Negative.     Physical Exam:  weight is 187 lb 6.4 oz (85 kg). His oral temperature is 98.6 F (37 C). His blood pressure is 94/54 (abnormal) and his pulse is 76. His respiration is 17 and oxygen saturation is 97%.   Wt Readings from Last 3 Encounters:  05/23/18 187 lb 6.4 oz (85 kg)  05/19/18 183 lb 6.4 oz (83.2 kg)  05/16/18 181 lb 9.6 oz (82.4 kg)    Physical Exam  Constitutional: He is oriented to person, place, and time.  HENT:  Head: Normocephalic and atraumatic.  Mouth/Throat: Oropharynx is clear and moist.  Eyes: Pupils are equal, round, and reactive to light. EOM are normal.  Neck: Normal range of motion.  Cardiovascular: Normal rate, regular rhythm and normal heart sounds.  Pulmonary/Chest: Effort normal and breath sounds normal.  Abdominal: Soft. Bowel sounds are normal.  Musculoskeletal: Normal range of motion. He exhibits no edema, tenderness or deformity.  Lymphadenopathy:    He has no cervical adenopathy.  Neurological: He is alert and oriented to person, place, and time.  Skin: Skin is warm and dry. No rash noted. No erythema.  Psychiatric: He has a normal mood and affect. His behavior  is normal. Judgment and thought content normal.  Vitals reviewed.    Lab Results  Component Value Date   WBC 2.9 (L) 05/23/2018   HGB 9.9 (L) 05/23/2018   HCT 32.6 (L) 05/23/2018   MCV 112.8 (H) 05/23/2018   PLT 108 (L) 05/23/2018     Chemistry      Component Value Date/Time   NA 142 05/23/2018 1511   NA 145 05/15/2018   K 4.1 05/23/2018 1511   CL 105 05/23/2018 1511   CO2 35 (H) 05/23/2018 1511   BUN 46 (H) 05/23/2018 1511   BUN 47 (A) 05/15/2018   CREATININE 1.30 (H) 05/23/2018 1511   GLU 82 05/15/2018  Component Value Date/Time   CALCIUM 9.2 05/23/2018 1511   ALKPHOS 81 05/23/2018 1511   AST 30 05/23/2018 1511   ALT 23 05/23/2018 1511   BILITOT 0.9 05/23/2018 1511      Impression and Plan: Mr. Hann is a 84 year old white male.  He has myelodysplasia.  I think he has low-grade myelodysplasia.  His IPSS score is 0.5 so this puts him in a intermediate 1 category.  This has a 5-year survival of 3.5 years.   We will see if he can respond to Revlimid.  Hopefully, a 5 mg dose will be appropriate for him.  Our goal really is to try to decrease his transfusion requirements.  We will go ahead and give him a dose of Aranesp today.  This is definitely going to be a long-term problem.  He will be a chronic problem that will take a while to correct if we can corrected.  Again I know he has multiple other health issues.  These will have to be taken into account when we consider treatment recommendations.  I would like to see him back in about 3 weeks.  Hopefully, we will see that his blood is at least stable hopefully if not better.  I am not sure if we will be able to get the Revlimid form but we will try.  His wife says that his daughter wants him to be on hospice.  I told them that hospice would be for reasons other than his blood.  As such I am not sure why he would be going on hospice or they would even take him as a patient.  I spent about 45 minutes with he and  his wife and grandson today.  All the time was spent face-to-face with them.  I help coordinate care and will set up additional appointments.  I answered all their questions.    Volanda Napoleon, MD 10/15/20195:15 PM

## 2018-05-23 NOTE — Patient Instructions (Signed)

## 2018-05-23 NOTE — Addendum Note (Signed)
Addended by: Burney Gauze R on: 05/23/2018 05:39 PM   Modules accepted: Orders

## 2018-05-24 LAB — LACTATE DEHYDROGENASE: LDH: 219 U/L — ABNORMAL HIGH (ref 98–192)

## 2018-05-24 LAB — IRON AND TIBC
IRON: 112 ug/dL (ref 42–163)
Saturation Ratios: 38 % — ABNORMAL LOW (ref 42–163)
TIBC: 296 ug/dL (ref 202–409)
UIBC: 184 ug/dL

## 2018-05-24 LAB — ERYTHROPOIETIN: Erythropoietin: 127.4 m[IU]/mL — ABNORMAL HIGH (ref 2.6–18.5)

## 2018-05-24 LAB — FERRITIN: FERRITIN: 308 ng/mL (ref 24–336)

## 2018-05-25 ENCOUNTER — Encounter: Payer: Self-pay | Admitting: Internal Medicine

## 2018-05-25 ENCOUNTER — Telehealth: Payer: Self-pay | Admitting: *Deleted

## 2018-05-25 NOTE — Telephone Encounter (Signed)
Called to speak to patient and family about new Revlimid prescription. Started to provide wife and daughter with education related to Revlimid and they stated Hospice was coming to the house Monday to admit patient. Informed family that Revlimid would not be available to the patient if they chose to admit to Hospice. They were unsure of which direction they wanted to take. Suggested to the wife and daughter that they talk to Hospice on Monday and decide which direction they wanted to take, and then call me back. They agreed.  Enrollment for Revlimid on hold until family returns call with their decision.

## 2018-05-26 ENCOUNTER — Telehealth: Payer: Self-pay

## 2018-05-26 NOTE — Telephone Encounter (Signed)
Received call from pt's daughter [name] with multiple questions. [name] is concerned that Dr Marin Olp is suggesting treatment with Revlimid while pt's other physician's are suggesting Hospice.  Informed [name] that Dr Marin Olp is out of the office today but I would attempt to answer questions based on his progress note. Per Dr Antonieta Pert note, pt's hematology status would not make him hospice eligible, however he does state that pt has multiple comorbid conditions that may/may not make him a candidate. [name] voices understanding about this. States "I felt that was probably the explanation they were given but my parents are resistant to hearing what Hospice has to offer."  Education given to [facility] related to potential adverse effects and benefits of Revlimid. Explained that one of the goals of treatment is to reduce transfusion dependency which could be significant based on how [name] is describing pt's cardiac function.  [name] states at this time pt wishes to proceed with therapy and is asking her to cancel Hospice evaluation. She requests we do wait until Monday to enroll should patient and family change their minds over the weekend. dph

## 2018-05-29 ENCOUNTER — Non-Acute Institutional Stay (SKILLED_NURSING_FACILITY): Payer: Medicare Other | Admitting: Adult Health

## 2018-05-29 ENCOUNTER — Encounter: Payer: Self-pay | Admitting: Adult Health

## 2018-05-29 DIAGNOSIS — I482 Chronic atrial fibrillation, unspecified: Secondary | ICD-10-CM

## 2018-05-29 DIAGNOSIS — Z8719 Personal history of other diseases of the digestive system: Secondary | ICD-10-CM

## 2018-05-29 DIAGNOSIS — I5022 Chronic systolic (congestive) heart failure: Secondary | ICD-10-CM

## 2018-05-29 DIAGNOSIS — D46Z Other myelodysplastic syndromes: Secondary | ICD-10-CM | POA: Diagnosis not present

## 2018-05-29 DIAGNOSIS — N400 Enlarged prostate without lower urinary tract symptoms: Secondary | ICD-10-CM

## 2018-05-29 DIAGNOSIS — M1A9XX0 Chronic gout, unspecified, without tophus (tophi): Secondary | ICD-10-CM | POA: Diagnosis not present

## 2018-05-29 DIAGNOSIS — J9611 Chronic respiratory failure with hypoxia: Secondary | ICD-10-CM | POA: Diagnosis not present

## 2018-05-29 DIAGNOSIS — G2581 Restless legs syndrome: Secondary | ICD-10-CM | POA: Diagnosis not present

## 2018-05-29 DIAGNOSIS — F339 Major depressive disorder, recurrent, unspecified: Secondary | ICD-10-CM | POA: Diagnosis not present

## 2018-05-29 MED ORDER — ROPINIROLE HCL 4 MG PO TABS: 4.0000 mg | ORAL_TABLET | Freq: Every day | ORAL | 0 refills | Status: AC

## 2018-05-29 MED ORDER — ALLOPURINOL 100 MG PO TABS: 200.0000 mg | ORAL_TABLET | Freq: Every day | ORAL | 0 refills | Status: AC

## 2018-05-29 MED ORDER — TRIAMCINOLONE ACETONIDE 0.1 % EX CREA: 1.0000 "application " | TOPICAL_CREAM | Freq: Two times a day (BID) | CUTANEOUS | 0 refills | Status: AC

## 2018-05-29 MED ORDER — TAMSULOSIN HCL 0.4 MG PO CAPS: 0.4000 mg | ORAL_CAPSULE | Freq: Every day | ORAL | 0 refills | Status: AC

## 2018-05-29 MED ORDER — DUTASTERIDE 0.5 MG PO CAPS: 0.5000 mg | ORAL_CAPSULE | Freq: Every day | ORAL | 0 refills | Status: DC

## 2018-05-29 MED ORDER — MIRTAZAPINE 15 MG PO TABS: 15.0000 mg | ORAL_TABLET | Freq: Every day | ORAL | 0 refills | Status: AC

## 2018-05-29 MED ORDER — ESCITALOPRAM OXALATE 5 MG PO TABS: 5.0000 mg | ORAL_TABLET | Freq: Every day | ORAL | 0 refills | Status: AC

## 2018-05-29 MED ORDER — PANTOPRAZOLE SODIUM 40 MG PO TBEC: 40.0000 mg | DELAYED_RELEASE_TABLET | Freq: Every day | ORAL | 0 refills | Status: AC

## 2018-05-29 MED ORDER — IPRATROPIUM-ALBUTEROL 0.5-2.5 (3) MG/3ML IN SOLN: 3.0000 mL | RESPIRATORY_TRACT | 0 refills | Status: AC | PRN

## 2018-05-29 MED ORDER — FUROSEMIDE 80 MG PO TABS: 80.0000 mg | ORAL_TABLET | Freq: Two times a day (BID) | ORAL | 0 refills | Status: DC

## 2018-05-29 MED ORDER — POTASSIUM CHLORIDE ER 10 MEQ PO TBCR: 10.0000 meq | EXTENDED_RELEASE_TABLET | Freq: Two times a day (BID) | ORAL | 0 refills | Status: DC

## 2018-05-29 NOTE — Progress Notes (Signed)
Location:  [place] Room Number: 417-E Place of Service:  SNF (31) Provider:  Durenda Age, NP  Patient Care Team: [name], [name] Him, MD as PCP - General (Internal Medicine) Croitoru, Dani Gobble, MD as PCP - Cardiology (Cardiology) [name], MD as Consulting Physician (Cardiology)  Extended Emergency Contact Information Primary Emergency Contact: Dungee,Shelia Address: [address]          [place], [place] [address] Johnnette Litter of [place] Phone: [phone] Mobile Phone: [phone] Relation: Spouse Secondary Emergency Contact: [place] of [place] Mobile Phone: [phone] Relation: Daughter  Code Status:  Full Code  Goals of care: Advanced Directive information Advanced Directives 05/23/2018  Does Patient Have a Medical Advance Directive? No  Type of Advance Directive -  Does patient want to make changes to medical advance directive? -  Copy of [place] in Chart? -  Would patient like information on creating a medical advance directive? No - Patient declined  Pre-existing out of facility DNR order (yellow form or pink MOST form) -     Chief Complaint  Patient presents with  . Discharge Note    Patient is seen for discharge home on 05/30/18.    HPI:  Pt is an 84 y.o. male seen today for a discharge visit.  He is to discharge on 05/30/18 to home with hospice care.    He was admitted to [facility] rehabilitation on 05/11/2018 for short-term rehabilitation following an admission at [facility] 9/25-10/10/2017 for acute on chronic heart failure exacerbation and acute on chronic renal failure.  He was having shortness of breath for a couple of days and broke out in a rash.  O2 sat was in the 70s.  He was treated with IV Lasix and metolazone.  Cardiology and nephrology assisted in the care of the patient.  He became lethargic on 9/26 and blood gas showed hypercapnic respiratory failure.  He was  placed on BiPAP.  He then improved and became more alert.  He was noted to have pancytopenia and Dr. Marin Olp was consulted.  He underwent bone marrow biopsy on 9/30 and pathology report showed hypercellular bone marrow with dyspoietic changes consistent with myelodysplasia.  He had no blasts. He has a PMH of RLS, pulmonary hypertension, atrial fibrillation, OSA, stage 3 CKD, CAD, and congestive heart failure.   He had infusion of Darbopoetin alfa on 05/23/18 at the [facility].  Patient was admitted to this facility for short-term rehabilitation after the patient's recent hospitalization. Patient has completed SNF rehabilitation and therapy has cleared the patient for discharge.   Past Medical History:  Diagnosis Date  . Arthritis    "feet" (09/30/2017)  . CHF (congestive heart failure) ([place])   . Chronic bronchitis ([place])   . CKD (chronic kidney disease), stage III ([place])    [name] 09/30/2017  . Coronary artery disease   . Diverticulitis   . Dyspnea   . GERD (gastroesophageal reflux disease)   . Gout   . History of blood transfusion    "blood loss" (09/30/2017)  . Hyperlipidemia   . Hypertension   . MDS (myelodysplastic syndrome), high grade ([place]) 05/11/2018  . MI (myocardial infarction) ([place]) ~ 2000   "light one"  . Nonischemic cardiomyopathy (HCC)    mild  . OSA on CPAP   . Pacemaker single chamber, [place], 2013 10/13/2011  . Permanent atrial fibrillation    on Eliquis  . Pulmonary hypertension ([place])   . Restless legs  Past Surgical History:  Procedure Laterality Date  . APPENDECTOMY  12/2000   [name] 12/22/2010  . BIV UPGRADE N/A 02/22/2018   Procedure: BIV UPGRADE;  Surgeon: [name], MD;  Location: [place] CV LAB;  Service: Cardiovascular;  Laterality: N/A;  . BIV UPGRADE N/A 03/29/2018   Procedure: BIV PPM UPGRADE;  Surgeon: [name], MD;  Location: [place] CV LAB;  Service: Cardiovascular;  Laterality: N/A;  . CARDIAC  CATHETERIZATION  11/07/2008   nonischemic cardiomyopathy,pulmonary hypertension  . CATARACT EXTRACTION W/ INTRAOCULAR LENS  IMPLANT, BILATERAL Bilateral   . CIRCUMCISION  12/2005   [name] 12/22/2010  . COLOSTOMY  12/2000   [name] 12/22/2010  . COLOSTOMY REVERSAL  07/2001   [name] 12/22/2010  . CORONARY ANGIOPLASTY  06/01/1999   successful to ostium of the first diagonal  . ESOPHAGOGASTRODUODENOSCOPY N/A 08/22/2013   Procedure: ESOPHAGOGASTRODUODENOSCOPY (EGD);  Surgeon: [name], MD;  Location: [facility] ENDOSCOPY;  Service: Endoscopy;  Laterality: N/A;  h/p in file cabinet, jackie  . ESOPHAGOGASTRODUODENOSCOPY N/A 11/05/2014   Procedure: ESOPHAGOGASTRODUODENOSCOPY (EGD);  Surgeon: Clarene Essex, MD;  Location: [facility] ENDOSCOPY;  Service: Endoscopy;  Laterality: N/A;  . ESOPHAGOGASTRODUODENOSCOPY N/A 11/08/2016   Procedure: ESOPHAGOGASTRODUODENOSCOPY (EGD);  Surgeon: [name], MD;  Location: [facility] ENDOSCOPY;  Service: Endoscopy;  Laterality: N/A;  . ESOPHAGOGASTRODUODENOSCOPY (EGD) WITH PROPOFOL Left 11/05/2017   Procedure: ESOPHAGOGASTRODUODENOSCOPY (EGD) WITH PROPOFOL;  Surgeon: [name], MD;  Location: [place];  Service: Endoscopy;  Laterality: Left;  . HOT HEMOSTASIS N/A 11/05/2014   Procedure: HOT HEMOSTASIS (ARGON PLASMA COAGULATION/BICAP);  Surgeon: Clarene Essex, MD;  Location[facility] ENDOSCOPY;  Service: Endoscopy;  Laterality: N/A;  . INSERT / REPLACE / REMOVE PACEMAKER    . JOINT REPLACEMENT    . MASS EXCISION Left    hand w/ulnar artery reconstruction/notes 12/22/2010  . NM MYOCAR PERF WALL MOTION  11/24/2007   normal  . PERMANENT PACEMAKER INSERTION  10/04/2012   [employer]  . PERMANENT PACEMAKER INSERTION N/A 10/12/2011   Procedure: PERMANENT PACEMAKER INSERTION;  Surgeon: [name], MD;  Location: [place] CATH LAB;  Service: Cardiovascular;  Laterality: N/A;  . REPLACEMENT TOTAL KNEE Bilateral 11/2006   right-left/notes 12/22/2010  . ROTATOR CUFF REPAIR Right 09/2004   [name]  12/22/2010  . SAVORY DILATION N/A 08/22/2013   Procedure: SAVORY DILATION;  Surgeon: [name], MD;  Location: [facility] ENDOSCOPY;  Service: Endoscopy;  Laterality: N/A;  . SHOULDER SURGERY Right    "fell off house; messed up 3 things in my arm"  . TONSILLECTOMY    . US ECHOCARDIOGRAPHY  02/01/2011   LA is mod-severely dilated,AOV & root sclerotic,ca+ AOV leaflets    Allergies  Allergen Reactions  . Vicodin [Hydrocodone-Acetaminophen] Itching and Nausea Only    Outpatient Encounter Medications as of 05/29/2018  Medication Sig  . allopurinol (ZYLOPRIM) 100 MG tablet Take 2 tablets (200 mg total) by mouth daily.  [name] aspirin EC 81 MG EC tablet Take 1 tablet (81 mg total) by mouth daily.  . diphenhydrAMINE-zinc acetate (BENADRYL) cream Apply topically 2 (two) times daily as needed for itching.  . dutasteride (AVODART) 0.5 MG capsule Take 0.5 mg by mouth daily.   [name] escitalopram (LEXAPRO) 5 MG tablet Take 1 tablet (5 mg total) by mouth at bedtime.  . furosemide (LASIX) 40 MG tablet Take 1 tablet (40 mg total) by mouth daily. Take 1 tablet as needed for 3lbs gain in a day or 5lbs gain in a week  . furosemide (LASIX) 80  MG tablet Take 1 tablet (80 mg total) by mouth 2 (two) times daily.  [name] ipratropium-albuterol (DUONEB) 0.5-2.5 (3) MG/3ML SOLN Take 3 mLs by nebulization every 4 (four) hours as needed.  . loratadine (CLARITIN) 10 MG tablet Take 10 mg by mouth daily as needed for allergies.  . mirtazapine (REMERON) 15 MG tablet Take 15 mg by mouth at bedtime.   . Multiple Vitamin (MULTIVITAMIN WITH MINERALS) TABS tablet Take 1 tablet by mouth daily.  . Multiple Vitamins-Minerals (PRESERVISION AREDS 2) CAPS Take 1 capsule by mouth daily.  . OXYGEN Inhale 2 L into the lungs daily.  . pantoprazole (PROTONIX) 40 MG tablet Take 1 tablet (40 mg total) by mouth daily.  . potassium chloride (K-DUR) 10 MEQ tablet Take 10 mEq by mouth 2 (two) times daily.  [name] rOPINIRole (REQUIP) 4 MG tablet Take 4 mg by mouth at  bedtime.  . Tamsulosin HCl (FLOMAX) 0.4 MG CAPS Take 0.4 mg by mouth daily after breakfast.  . triamcinolone cream (KENALOG) 0.1 % Apply 1 application topically 2 (two) times daily.   . [DISCONTINUED] hydrOXYzine (ATARAX/VISTARIL) 10 MG tablet Take 1 tablet (10 mg total) by mouth 3 (three) times daily as needed for itching.  . [DISCONTINUED] lenalidomide (REVLIMID) 5 MG capsule Take 1 capsule daily at bedtime for 21 days then 7 days off.   No facility-administered encounter medications on file as of 05/29/2018.     Review of Systems  GENERAL: No change in appetite, no fatigue, no weight changes, no fever, chills or weakness MOUTH and THROAT: Denies oral discomfort, gingival pain or bleeding, pain from teeth or hoarseness   RESPIRATORY: no cough, SOB, DOE, wheezing, hemoptysis CARDIAC: No chest pain, edema or palpitations GI: No abdominal pain, diarrhea, constipation, heart burn, nausea or vomiting GU: Denies dysuria, frequency, hematuria, incontinence, or discharge PSYCHIATRIC: Denies feelings of depression or anxiety. No report of hallucinations, insomnia, paranoia, or agitation   Immunization History  Administered Date(s) Administered  . Influenza, High Dose Seasonal PF 05/05/2018  . Tdap 10/15/2012, 01/04/2017   Pertinent  Health Maintenance Due  Topic Date Due  . FOOT EXAM  10/07/1941  . OPHTHALMOLOGY EXAM  10/07/1941  . PNA vac Low Risk Adult (1 of 2 - PCV13) 10/06/1998  . HEMOGLOBIN A1C  07/14/2018  . URINE MICROALBUMIN  01/10/2019  . INFLUENZA VACCINE  Completed      Vitals:   05/29/18 1012  BP: 136/71  Pulse: 86  Resp: 20  Temp: (!) 97.1 F (36.2 C)  TempSrc: Oral  SpO2: 96%  Weight: 186 lb (84.4 kg)  Height: '5\' 7"'  (1.702 m)   Body mass index is 29.13 kg/m.  Physical Exam  GENERAL APPEARANCE: Well nourished. In no acute distress. Normal body habitus SKIN:  Skin is warm and dry.  MOUTH and THROAT: Lips are without lesions. Oral mucosa is moist and without  lesions. Tongue is normal in shape, size, and color and without lesions RESPIRATORY: Breathing is even & unlabored, BS CTAB, O2 @ 2L/min CARDIAC: RRR, no murmur,no extra heart sounds, no edema, left chest pacemaker GI: Abdomen soft, normal BS, no masses, no tenderness EXTREMITIES:  Able to move X 4 extremities PSYCHIATRIC: Alert to self and place, disoriented to time. Affect and behavior are appropriate   Labs reviewed: Recent Labs    09/30/17 1542  11/03/17 1344  01/12/18 0931  05/09/18 0350 05/10/18 0408 05/11/18 0506 05/15/18 05/23/18 1511  NA 141   < >  --    < >  --    < >  138 138 138 145 142  K 3.6   < >  --    < >  --    < > 4.5 4.4 4.0 4.4 4.1  CL 97*   < >  --    < >  --    < > 89* 90* 92*  --  105  CO2 30   < >  --    < >  --    < > 44* 43* 41*  --  35*  GLUCOSE 76   < >  --    < >  --    < > 98 110* 94  --  102  BUN 53*   < >  --    < >  --    < > 60* 57* 53* 47* 46*  CREATININE 1.37*   < >  --    < >  --    < > 1.34* 1.38* 1.29* 1.2 1.30*  CALCIUM 8.9   < >  --    < >  --    < > 9.0 9.0 8.7*  --  9.2  MG 2.1  --  2.2  --  2.2  --   --   --   --   --   --   PHOS  --   --   --    < >  --    < > 3.5 4.4 4.1  --   --    < > = values in this interval not displayed.   Recent Labs    01/14/18 0756  05/05/18 0749  05/10/18 0408 05/11/18 0506 05/23/18 1511  AST 75*  --  27  --   --   --  30  ALT 104*  --  35  --   --   --  23  ALKPHOS 90  --  74  --   --   --  81  BILITOT 0.8  --  1.3*  --   --   --  0.9  PROT 6.4*  --  6.4*  --   --   --  7.1  ALBUMIN 2.9*   < > 2.9*   < > 2.9* 3.0* 3.2*   < > = values in this interval not displayed.   Recent Labs    05/09/18 0350 05/11/18 0506 05/15/18 05/23/18 1511  WBC 2.7* 2.9* 3.3 2.9*  NEUTROABS 1.4* 1.6* 2 1.5*  HGB 8.4* 10.5* 11.2* 9.9*  HCT 26.8* 32.1* 33* 32.6*  MCV 115.0* 108.8*  --  112.8*  PLT 96* 92* 92* 108*   Lab Results  Component Value Date   TSH 2.95 05/17/2018   Lab Results  Component Value Date    HGBA1C 5.2 01/12/2018   Lab Results  Component Value Date   CHOL 117 11/04/2017   HDL 41 11/04/2017   LDLCALC 64 11/04/2017   TRIG 61 11/04/2017   CHOLHDL 2.9 11/04/2017    Significant Diagnostic Results in last 30 days:  Dg Chest 2 View  Result Date: 05/03/2018 CLINICAL DATA:  84 year old male with CHF. EXAM: CHEST - 2 VIEW COMPARISON:  Chest radiograph dated 03/30/2018 FINDINGS: There is cardiomegaly with mild vascular congestion and probable mild edema. No focal consolidation, pleural effusion, or pneumothorax. There is atherosclerotic calcification of the aorta. The aorta is tortuous. Left pectoral pacemaker device. Osteopenia with degenerative changes of the spine. No acute osseous pathology. IMPRESSION: Cardiomegaly with mild vascular congestion and probable mild interstitial  edema. Electronically Signed   By: Anner Crete M.D.   On: 05/03/2018 04:53   Dg Shoulder Right  Result Date: 05/09/2018 CLINICAL DATA:  Pain EXAM: RIGHT SHOULDER - 2+ VIEW COMPARISON:  04/05/2017 FINDINGS: Probable prior distal clavicle resection. Anchors noted in the humeral head. Advanced arthritic changes in the right glenohumeral joint with spurring, joint space loss and loss of subacromial space. No fracture, subluxation or dislocation. IMPRESSION: Advanced arthritic changes in the right shoulder, postoperative changes. No acute bony abnormality. Electronically Signed   By: Rolm Baptise M.D.   On: 05/09/2018 09:37   US Renal  Result Date: 05/05/2018 CLINICAL DATA:  Acute renal injury EXAM: RENAL / URINARY TRACT ULTRASOUND COMPLETE COMPARISON:  01/10/2018 FINDINGS: Right Kidney: Length: 12.6 cm. Multiple cysts are identified. The largest of these measures 3.9 cm in greatest dimension and stable from the prior exam. No hydronephrosis is noted. Left Kidney: Length: 13.2 cm. Multiple cysts are noted. The largest of these measures 6.8 cm in greatest dimension also stable from the previous exam. No hydronephrosis  is noted. Bladder: Decompressed Small pleural effusions are noted bilaterally. Minimal ascites is seen as well. IMPRESSION: Bilateral renal cysts stable from the prior CT examination. Bilateral pleural effusions and mild ascites. Electronically Signed   By: Inez Catalina M.D.   On: 05/05/2018 06:12   Ct Bone Marrow Biopsy & Aspiration  Result Date: 05/08/2018 INDICATION: 84 year old male with a history of anemia EXAM: CT BONE MARROW BIOPSY AND ASPIRATION MEDICATIONS: None. ANESTHESIA/SEDATION: Moderate (conscious) sedation was employed during this procedure. A total of Versed 1.0 mg and Fentanyl 50 mcg was administered intravenously. Moderate Sedation Time: 10 minutes. The patient's level of consciousness and vital signs were monitored continuously by radiology nursing throughout the procedure under my direct supervision. FLUOROSCOPY TIME:  CT COMPLICATIONS: None PROCEDURE: The procedure risks, benefits, and alternatives were explained to the patient. Questions regarding the procedure were encouraged and answered. The patient understands and consents to the procedure. Scout CT of the pelvis was performed for surgical planning purposes. The posterior pelvis was prepped with Chlorhexidine in a sterile fashion, and a sterile drape was applied covering the operative field. A sterile gown and sterile gloves were used for the procedure. Local anesthesia was provided with 1% Lidocaine. Posterior iliac bone was targeted for biopsy. The skin and subcutaneous tissues were infiltrated with 1% lidocaine without epinephrine. A small stab incision was made with an 11 blade scalpel, and an 11 gauge Murphy needle was advanced with CT guidance to the posterior cortex. Manual forced was used to advance the needle through the posterior cortex and the stylet was removed. A bone marrow aspirate was retrieved and passed to a cytotechnologist in the room. The Murphy needle was then advanced without the stylet for a core biopsy. The  core biopsy was retrieved and also passed to a cytotechnologist. Manual pressure was used for hemostasis and a sterile dressing was placed. No complications were encountered no significant blood loss was encountered. Patient tolerated the procedure well and remained hemodynamically stable throughout. IMPRESSION: Status post CT-guided bone marrow biopsy, with tissue specimen sent to pathology for complete histopathologic analysis Signed, Dulcy Fanny. Earleen Newport, DO [facility] Radiology Electronically Signed   By: Corrie Mckusick D.O.   On: 05/08/2018 11:16    Assessment/Plan  1. MDS (myelodysplastic syndrome), high grade ([place]) -  Follows up with Dr. Marin Olp: [facility], recently had Aranesp infusion on 05/23/18, will continue Revlimid   2. Chronic  respiratory failure with hypoxia (HCC) -continue O2 at 2 L/minute via [place] continuously - ipratropium-albuterol (DUONEB) 0.5-2.5 (3) MG/3ML SOLN; Take 3 mLs by nebulization every 4 (four) hours as needed.  Dispense: 360 mL; Refill: 0  3. Congestive heart failure, NYHA class 3, chronic, systolic (HCC) - furosemide (LASIX) 80 MG tablet; Take 1 tablet (80 mg total) by mouth 2 (two) times daily.  Dispense: 60 tablet; Refill: 0 - potassium chloride (K-DUR) 10 MEQ tablet; Take 1 tablet (10 mEq total) by mouth 2 (two) times daily.  Dispense: 60 tablet; Refill: 0  4. History of GI bleed - pantoprazole (PROTONIX) 40 MG tablet; Take 1 tablet (40 mg total) by mouth daily.  Dispense: 30 tablet; Refill: 0  5. Restless leg syndrome - rOPINIRole (REQUIP) 4 MG tablet; Take 1 tablet (4 mg total) by mouth at bedtime.  Dispense: 30 tablet; Refill: 0  6. Chronic atrial fibrillation - rate controlled, not on any anticoagulant due to history of GI bleed   7. Depression, recurrent ([place]) - escitalopram (LEXAPRO) 5 MG tablet; Take 1 tablet (5 mg total) by mouth at bedtime.  Dispense: 30 tablet; Refill: 0 - mirtazapine  (REMERON) 15 MG tablet; Take 1 tablet (15 mg total) by mouth at bedtime.  Dispense: 30 tablet; Refill: 0  8. Chronic gout without tophus, unspecified cause, unspecified site - allopurinol (ZYLOPRIM) 100 MG tablet; Take 2 tablets (200 mg total) by mouth daily.  Dispense: 30 tablet; Refill: 0  9. Benign prostatic hyperplasia, unspecified whether lower urinary tract symptoms present - dutasteride (AVODART) 0.5 MG capsule; Take 1 capsule (0.5 mg total) by mouth daily.  Dispense: 30 capsule; Refill: 0 - tamsulosin (FLOMAX) 0.4 MG CAPS capsule; Take 1 capsule (0.4 mg total) by mouth daily after breakfast.  Dispense: 30 capsule; Refill: 0     I have filled out patient's discharge paperwork and written prescriptions.  Patient will receive home health PT, OT, and Nursing.  DME provided:  Hospital bed  Total discharge time: Greater/less than 30 minutes Greater than 50% was spent in counseling and coordination of care.  Discharge time involved coordination of the discharge process with social worker, nursing staff and therapy department. Medical justification for home health services/DME verified.   Durenda Age, NP [facility] [phone] (Monday-Friday 8:00 a.m. - 5:00 p.m.) [phone] (after hours)

## 2018-05-31 ENCOUNTER — Telehealth: Payer: Self-pay | Admitting: Pharmacy Technician

## 2018-05-31 ENCOUNTER — Telehealth: Payer: Self-pay | Admitting: Pharmacist

## 2018-05-31 DIAGNOSIS — I2510 Atherosclerotic heart disease of native coronary artery without angina pectoris: Secondary | ICD-10-CM | POA: Diagnosis not present

## 2018-05-31 DIAGNOSIS — J449 Chronic obstructive pulmonary disease, unspecified: Secondary | ICD-10-CM | POA: Diagnosis not present

## 2018-05-31 DIAGNOSIS — I428 Other cardiomyopathies: Secondary | ICD-10-CM | POA: Diagnosis not present

## 2018-05-31 DIAGNOSIS — G2581 Restless legs syndrome: Secondary | ICD-10-CM | POA: Diagnosis not present

## 2018-05-31 DIAGNOSIS — M1990 Unspecified osteoarthritis, unspecified site: Secondary | ICD-10-CM | POA: Diagnosis not present

## 2018-05-31 DIAGNOSIS — I272 Pulmonary hypertension, unspecified: Secondary | ICD-10-CM | POA: Diagnosis not present

## 2018-05-31 DIAGNOSIS — I252 Old myocardial infarction: Secondary | ICD-10-CM | POA: Diagnosis not present

## 2018-05-31 DIAGNOSIS — I5022 Chronic systolic (congestive) heart failure: Secondary | ICD-10-CM | POA: Diagnosis not present

## 2018-05-31 DIAGNOSIS — J9611 Chronic respiratory failure with hypoxia: Secondary | ICD-10-CM | POA: Diagnosis not present

## 2018-05-31 DIAGNOSIS — Z9981 Dependence on supplemental oxygen: Secondary | ICD-10-CM | POA: Diagnosis not present

## 2018-05-31 DIAGNOSIS — I4821 Permanent atrial fibrillation: Secondary | ICD-10-CM | POA: Diagnosis not present

## 2018-05-31 DIAGNOSIS — I130 Hypertensive heart and chronic kidney disease with heart failure and stage 1 through stage 4 chronic kidney disease, or unspecified chronic kidney disease: Secondary | ICD-10-CM | POA: Diagnosis not present

## 2018-05-31 DIAGNOSIS — F339 Major depressive disorder, recurrent, unspecified: Secondary | ICD-10-CM | POA: Diagnosis not present

## 2018-05-31 DIAGNOSIS — N183 Chronic kidney disease, stage 3 (moderate): Secondary | ICD-10-CM | POA: Diagnosis not present

## 2018-05-31 DIAGNOSIS — D469 Myelodysplastic syndrome, unspecified: Secondary | ICD-10-CM | POA: Diagnosis not present

## 2018-05-31 DIAGNOSIS — Z950 Presence of cardiac pacemaker: Secondary | ICD-10-CM | POA: Diagnosis not present

## 2018-05-31 DIAGNOSIS — Z96653 Presence of artificial knee joint, bilateral: Secondary | ICD-10-CM | POA: Diagnosis not present

## 2018-05-31 NOTE — Telephone Encounter (Signed)
Oral Oncology Patient Advocate Encounter  Received notification from [facility] that prior authorization for Revlimid is required.  PA submitted on CoverMyMeds Key [id number] Status is pending  Oral Oncology Clinic will continue to follow.  [place] Patient [place] Phone [phone] Fax [phone] 05/31/2018 3:56 PM

## 2018-05-31 NOTE — Telephone Encounter (Signed)
Oral Oncology Pharmacist Encounter  Received new prescription for Revlimid (lenalidomide) for the treatment of MDS, planned duration until disease progression or unacceptable drug toxicity.  CBC/CMP from 05/23/18 assessed, no relevant lab abnormalities. Prescription dose and frequency assessed.   Current medication list in Epic reviewed, no DDIs with Revlimid identified.  Prescription has been e-scribed to the [facility] for benefits analysis and approval.  Oral Oncology Clinic will continue to follow for insurance authorization, copayment issues, initial counseling and start date.  Darl Pikes, PharmD, BCPS, [facility] Hematology/Oncology Clinical Pharmacist [facility]/HP/AP Oral [place] [facility] [phone]  05/31/2018 3:57 PM

## 2018-06-01 DIAGNOSIS — I10 Essential (primary) hypertension: Secondary | ICD-10-CM | POA: Diagnosis not present

## 2018-06-01 DIAGNOSIS — I8310 Varicose veins of unspecified lower extremity with inflammation: Secondary | ICD-10-CM | POA: Diagnosis not present

## 2018-06-01 DIAGNOSIS — G4733 Obstructive sleep apnea (adult) (pediatric): Secondary | ICD-10-CM | POA: Diagnosis not present

## 2018-06-01 DIAGNOSIS — M109 Gout, unspecified: Secondary | ICD-10-CM | POA: Diagnosis not present

## 2018-06-01 DIAGNOSIS — D61818 Other pancytopenia: Secondary | ICD-10-CM | POA: Diagnosis not present

## 2018-06-01 DIAGNOSIS — Z950 Presence of cardiac pacemaker: Secondary | ICD-10-CM | POA: Diagnosis not present

## 2018-06-01 DIAGNOSIS — I5023 Acute on chronic systolic (congestive) heart failure: Secondary | ICD-10-CM | POA: Diagnosis not present

## 2018-06-01 DIAGNOSIS — D469 Myelodysplastic syndrome, unspecified: Secondary | ICD-10-CM | POA: Diagnosis not present

## 2018-06-01 DIAGNOSIS — D509 Iron deficiency anemia, unspecified: Secondary | ICD-10-CM | POA: Diagnosis not present

## 2018-06-01 DIAGNOSIS — Z9981 Dependence on supplemental oxygen: Secondary | ICD-10-CM | POA: Diagnosis not present

## 2018-06-01 DIAGNOSIS — I480 Paroxysmal atrial fibrillation: Secondary | ICD-10-CM | POA: Diagnosis not present

## 2018-06-01 DIAGNOSIS — N183 Chronic kidney disease, stage 3 (moderate): Secondary | ICD-10-CM | POA: Diagnosis not present

## 2018-06-02 DIAGNOSIS — I130 Hypertensive heart and chronic kidney disease with heart failure and stage 1 through stage 4 chronic kidney disease, or unspecified chronic kidney disease: Secondary | ICD-10-CM | POA: Diagnosis not present

## 2018-06-02 DIAGNOSIS — I5022 Chronic systolic (congestive) heart failure: Secondary | ICD-10-CM | POA: Diagnosis not present

## 2018-06-02 DIAGNOSIS — D469 Myelodysplastic syndrome, unspecified: Secondary | ICD-10-CM | POA: Diagnosis not present

## 2018-06-02 DIAGNOSIS — I252 Old myocardial infarction: Secondary | ICD-10-CM | POA: Diagnosis not present

## 2018-06-02 DIAGNOSIS — I2510 Atherosclerotic heart disease of native coronary artery without angina pectoris: Secondary | ICD-10-CM | POA: Diagnosis not present

## 2018-06-02 DIAGNOSIS — N183 Chronic kidney disease, stage 3 (moderate): Secondary | ICD-10-CM | POA: Diagnosis not present

## 2018-06-03 ENCOUNTER — Telehealth: Payer: Self-pay | Admitting: Internal Medicine

## 2018-06-03 NOTE — Telephone Encounter (Signed)
Cardiology Moonlighter Note  Received page from patient's wife. Patient was recently admitted to hospital for respiratory failure. Previously was using 2.5L of oxygen continuously. On discharge, patient was instructed to use 3L/min oxygen continuously. Has oxygen tanks at home as well as portable oxygen concentrator. Currently has plenty of oxygen to get him through the week. Patient requesting someone to contact his home oxygen service to make them aware of the increased dose. Thinks they need an order for the higher oxygen dose.   I advised patient that this would best be done by his outpatient team. Contact information is "[facility]; [phone]"  Patient and wife understand that they should call back or come to ED if they have issues with the oxygen concentrator or if they are getting low on their oxygen tank supply. A copy of this message will be sent to Dr. [name].  Marcie Mowers, MD Cardiology Fellow, PGY-6

## 2018-06-05 ENCOUNTER — Telehealth: Payer: Self-pay | Admitting: Hematology & Oncology

## 2018-06-05 NOTE — Telephone Encounter (Signed)
sw pt wife to confirm 06/19/18 appt at 12 pm per sch msg

## 2018-06-06 DIAGNOSIS — I130 Hypertensive heart and chronic kidney disease with heart failure and stage 1 through stage 4 chronic kidney disease, or unspecified chronic kidney disease: Secondary | ICD-10-CM | POA: Diagnosis not present

## 2018-06-06 DIAGNOSIS — I5022 Chronic systolic (congestive) heart failure: Secondary | ICD-10-CM | POA: Diagnosis not present

## 2018-06-06 DIAGNOSIS — I252 Old myocardial infarction: Secondary | ICD-10-CM | POA: Diagnosis not present

## 2018-06-06 DIAGNOSIS — N183 Chronic kidney disease, stage 3 (moderate): Secondary | ICD-10-CM | POA: Diagnosis not present

## 2018-06-06 DIAGNOSIS — D469 Myelodysplastic syndrome, unspecified: Secondary | ICD-10-CM | POA: Diagnosis not present

## 2018-06-06 DIAGNOSIS — I2510 Atherosclerotic heart disease of native coronary artery without angina pectoris: Secondary | ICD-10-CM | POA: Diagnosis not present

## 2018-06-06 NOTE — Telephone Encounter (Signed)
Can we send in the appropriate order please? MCr

## 2018-06-07 DIAGNOSIS — N183 Chronic kidney disease, stage 3 (moderate): Secondary | ICD-10-CM | POA: Diagnosis not present

## 2018-06-07 DIAGNOSIS — I2510 Atherosclerotic heart disease of native coronary artery without angina pectoris: Secondary | ICD-10-CM | POA: Diagnosis not present

## 2018-06-07 DIAGNOSIS — I130 Hypertensive heart and chronic kidney disease with heart failure and stage 1 through stage 4 chronic kidney disease, or unspecified chronic kidney disease: Secondary | ICD-10-CM | POA: Diagnosis not present

## 2018-06-07 DIAGNOSIS — D469 Myelodysplastic syndrome, unspecified: Secondary | ICD-10-CM | POA: Diagnosis not present

## 2018-06-07 DIAGNOSIS — I5022 Chronic systolic (congestive) heart failure: Secondary | ICD-10-CM | POA: Diagnosis not present

## 2018-06-07 DIAGNOSIS — I252 Old myocardial infarction: Secondary | ICD-10-CM | POA: Diagnosis not present

## 2018-06-08 ENCOUNTER — Telehealth: Payer: Self-pay | Admitting: Cardiovascular Disease

## 2018-06-08 DIAGNOSIS — N183 Chronic kidney disease, stage 3 (moderate): Secondary | ICD-10-CM | POA: Diagnosis not present

## 2018-06-08 DIAGNOSIS — D469 Myelodysplastic syndrome, unspecified: Secondary | ICD-10-CM | POA: Diagnosis not present

## 2018-06-08 DIAGNOSIS — I130 Hypertensive heart and chronic kidney disease with heart failure and stage 1 through stage 4 chronic kidney disease, or unspecified chronic kidney disease: Secondary | ICD-10-CM | POA: Diagnosis not present

## 2018-06-08 DIAGNOSIS — I2510 Atherosclerotic heart disease of native coronary artery without angina pectoris: Secondary | ICD-10-CM | POA: Diagnosis not present

## 2018-06-08 DIAGNOSIS — I252 Old myocardial infarction: Secondary | ICD-10-CM | POA: Diagnosis not present

## 2018-06-08 DIAGNOSIS — I5022 Chronic systolic (congestive) heart failure: Secondary | ICD-10-CM | POA: Diagnosis not present

## 2018-06-08 NOTE — Telephone Encounter (Signed)
Per Janan Ridge PA office note 05/19/18   HEMODYNAMIC MONITORING: 1. Right atrial pressure 11, right ventricular pressure 42/11, pulmonary artery pressure 43/21, wedge 21, central aortic pressure 131/64, left ventricular pressure 128/13 with no significant valve gradient noted at the time of pullback. 2. Oxygen saturation on 2 L 100% in the AO and 57% in the PA.  Left message for PT to call back to discuss further

## 2018-06-08 NOTE — Telephone Encounter (Signed)
New Message     [place] therapist is calling, states the pt says he is on oxygen and he is wanting to know the dosage. Also that the pt was breathing room air for physical therapy session and his lowest value was 92% with mild exertion. Please call

## 2018-06-09 DIAGNOSIS — I252 Old myocardial infarction: Secondary | ICD-10-CM | POA: Diagnosis not present

## 2018-06-09 DIAGNOSIS — D469 Myelodysplastic syndrome, unspecified: Secondary | ICD-10-CM | POA: Diagnosis not present

## 2018-06-09 DIAGNOSIS — I130 Hypertensive heart and chronic kidney disease with heart failure and stage 1 through stage 4 chronic kidney disease, or unspecified chronic kidney disease: Secondary | ICD-10-CM | POA: Diagnosis not present

## 2018-06-09 DIAGNOSIS — I5022 Chronic systolic (congestive) heart failure: Secondary | ICD-10-CM | POA: Diagnosis not present

## 2018-06-09 DIAGNOSIS — I2510 Atherosclerotic heart disease of native coronary artery without angina pectoris: Secondary | ICD-10-CM | POA: Diagnosis not present

## 2018-06-09 DIAGNOSIS — N183 Chronic kidney disease, stage 3 (moderate): Secondary | ICD-10-CM | POA: Diagnosis not present

## 2018-06-13 DIAGNOSIS — I5022 Chronic systolic (congestive) heart failure: Secondary | ICD-10-CM | POA: Diagnosis not present

## 2018-06-13 DIAGNOSIS — I2510 Atherosclerotic heart disease of native coronary artery without angina pectoris: Secondary | ICD-10-CM | POA: Diagnosis not present

## 2018-06-13 DIAGNOSIS — I252 Old myocardial infarction: Secondary | ICD-10-CM | POA: Diagnosis not present

## 2018-06-13 DIAGNOSIS — I130 Hypertensive heart and chronic kidney disease with heart failure and stage 1 through stage 4 chronic kidney disease, or unspecified chronic kidney disease: Secondary | ICD-10-CM | POA: Diagnosis not present

## 2018-06-13 DIAGNOSIS — N183 Chronic kidney disease, stage 3 (moderate): Secondary | ICD-10-CM | POA: Diagnosis not present

## 2018-06-13 DIAGNOSIS — D469 Myelodysplastic syndrome, unspecified: Secondary | ICD-10-CM | POA: Diagnosis not present

## 2018-06-13 NOTE — Telephone Encounter (Signed)
OK to use O2 prn 2L/min with exercise MCr

## 2018-06-13 NOTE — Telephone Encounter (Signed)
Therapist is wanting to know if pt may use O 2 prn if tolerates and oxygen  stays at or above 90% or does he need to be on continuous oxygen .Will forward to Dr [name] for review .Adonis Housekeeper

## 2018-06-14 ENCOUNTER — Telehealth: Payer: Self-pay | Admitting: Pharmacy Technician

## 2018-06-14 DIAGNOSIS — I5022 Chronic systolic (congestive) heart failure: Secondary | ICD-10-CM | POA: Diagnosis not present

## 2018-06-14 DIAGNOSIS — I130 Hypertensive heart and chronic kidney disease with heart failure and stage 1 through stage 4 chronic kidney disease, or unspecified chronic kidney disease: Secondary | ICD-10-CM | POA: Diagnosis not present

## 2018-06-14 DIAGNOSIS — D469 Myelodysplastic syndrome, unspecified: Secondary | ICD-10-CM | POA: Diagnosis not present

## 2018-06-14 DIAGNOSIS — I2510 Atherosclerotic heart disease of native coronary artery without angina pectoris: Secondary | ICD-10-CM | POA: Diagnosis not present

## 2018-06-14 DIAGNOSIS — I252 Old myocardial infarction: Secondary | ICD-10-CM | POA: Diagnosis not present

## 2018-06-14 DIAGNOSIS — N183 Chronic kidney disease, stage 3 (moderate): Secondary | ICD-10-CM | POA: Diagnosis not present

## 2018-06-14 NOTE — Telephone Encounter (Addendum)
Oral Oncology Patient Advocate Encounter  Prior Authorization for Revlimid has been approved.    PA# [id number] Effective dates: 06/02/18 through 09/02/18  Patients co-pay is $2117.95.  [facility] is filling this perscription.    Oral Oncology Clinic will continue to follow.   [place] Patient [place] Phone [phone] Fax [phone]

## 2018-06-14 NOTE — Telephone Encounter (Signed)
Oral Oncology Patient Advocate Encounter  Patients wife returned my call.  I obtained financial information and told her I would forward this information to [facility] for her.  Was successful in securing patient a $10000 grant from [employer] to provide copayment coverage for Revlimid.  This will keep the out of pocket expense at $0.     [employer] ID: [id number]  I have spoken with the patient..     The billing information is as follows and has been shared with [place].    RxBin: [id number] PCN: [id number] Member ID: [id number] Group ID: [id number] Dates of Eligibility: 05/15/18 through 05/15/19  [place] Patient [place] Phone [phone] Fax [phone] 06/14/2018 11:44 AM

## 2018-06-14 NOTE — Telephone Encounter (Signed)
lmtcb

## 2018-06-14 NOTE — Telephone Encounter (Signed)
LEFT DETAILED MESSAGE THAT PT MAY USE O 2 PRN . PER DR [name] .Adonis Housekeeper

## 2018-06-14 NOTE — Telephone Encounter (Signed)
Oral Oncology Patient Advocate Encounter  I spoke to patients wife on 10/30 and told her that copay assistance was available.  I told her that I could sign the patient up for assistance and forward the information to [facility].  She said that she would call [employer] and give them the information so they could get the assistance.    I called on 11/4 to check the status of his prescription and [employer] said that it was still pending patient assistance.  They spoke to the patients wife on 10/31 and she said she would call them back.  She had not called back so I tried to call and there was no answer.  I called the patient again today 11/6 and left a message. I called [employer] to check the status again.  They have not heard back from the patient and called them on 11/5 and will call again today.  I will update this encounter as information comes available.  [facility] [place] Phone [phone] Fax [phone] 06/14/2018 11:00 AM

## 2018-06-15 DIAGNOSIS — N183 Chronic kidney disease, stage 3 (moderate): Secondary | ICD-10-CM | POA: Diagnosis not present

## 2018-06-15 DIAGNOSIS — I130 Hypertensive heart and chronic kidney disease with heart failure and stage 1 through stage 4 chronic kidney disease, or unspecified chronic kidney disease: Secondary | ICD-10-CM | POA: Diagnosis not present

## 2018-06-15 DIAGNOSIS — I252 Old myocardial infarction: Secondary | ICD-10-CM | POA: Diagnosis not present

## 2018-06-15 DIAGNOSIS — D469 Myelodysplastic syndrome, unspecified: Secondary | ICD-10-CM | POA: Diagnosis not present

## 2018-06-15 DIAGNOSIS — I2510 Atherosclerotic heart disease of native coronary artery without angina pectoris: Secondary | ICD-10-CM | POA: Diagnosis not present

## 2018-06-15 DIAGNOSIS — I5022 Chronic systolic (congestive) heart failure: Secondary | ICD-10-CM | POA: Diagnosis not present

## 2018-06-16 DIAGNOSIS — I480 Paroxysmal atrial fibrillation: Secondary | ICD-10-CM | POA: Diagnosis not present

## 2018-06-16 DIAGNOSIS — D508 Other iron deficiency anemias: Secondary | ICD-10-CM | POA: Diagnosis not present

## 2018-06-16 DIAGNOSIS — R7301 Impaired fasting glucose: Secondary | ICD-10-CM | POA: Diagnosis not present

## 2018-06-16 DIAGNOSIS — R262 Difficulty in walking, not elsewhere classified: Secondary | ICD-10-CM | POA: Diagnosis not present

## 2018-06-16 DIAGNOSIS — Z950 Presence of cardiac pacemaker: Secondary | ICD-10-CM | POA: Diagnosis not present

## 2018-06-16 DIAGNOSIS — D469 Myelodysplastic syndrome, unspecified: Secondary | ICD-10-CM | POA: Diagnosis not present

## 2018-06-16 DIAGNOSIS — E7800 Pure hypercholesterolemia, unspecified: Secondary | ICD-10-CM | POA: Diagnosis not present

## 2018-06-16 DIAGNOSIS — G4733 Obstructive sleep apnea (adult) (pediatric): Secondary | ICD-10-CM | POA: Diagnosis not present

## 2018-06-16 DIAGNOSIS — Z6828 Body mass index (BMI) 28.0-28.9, adult: Secondary | ICD-10-CM | POA: Diagnosis not present

## 2018-06-16 DIAGNOSIS — I208 Other forms of angina pectoris: Secondary | ICD-10-CM | POA: Diagnosis not present

## 2018-06-16 DIAGNOSIS — I10 Essential (primary) hypertension: Secondary | ICD-10-CM | POA: Diagnosis not present

## 2018-06-19 ENCOUNTER — Encounter: Payer: Self-pay | Admitting: Hematology & Oncology

## 2018-06-19 ENCOUNTER — Inpatient Hospital Stay: Payer: Medicare Other

## 2018-06-19 ENCOUNTER — Inpatient Hospital Stay: Payer: Medicare Other | Attending: Hematology & Oncology | Admitting: Hematology & Oncology

## 2018-06-19 ENCOUNTER — Other Ambulatory Visit: Payer: Self-pay

## 2018-06-19 ENCOUNTER — Telehealth: Payer: Self-pay | Admitting: Family

## 2018-06-19 VITALS — BP 106/60 | HR 95 | Temp 98.2°F | Resp 19 | Wt 184.0 lb

## 2018-06-19 DIAGNOSIS — I2510 Atherosclerotic heart disease of native coronary artery without angina pectoris: Secondary | ICD-10-CM | POA: Insufficient documentation

## 2018-06-19 DIAGNOSIS — Z7982 Long term (current) use of aspirin: Secondary | ICD-10-CM | POA: Diagnosis not present

## 2018-06-19 DIAGNOSIS — D469 Myelodysplastic syndrome, unspecified: Secondary | ICD-10-CM | POA: Diagnosis not present

## 2018-06-19 DIAGNOSIS — Z9989 Dependence on other enabling machines and devices: Secondary | ICD-10-CM

## 2018-06-19 DIAGNOSIS — Z79899 Other long term (current) drug therapy: Secondary | ICD-10-CM | POA: Diagnosis not present

## 2018-06-19 DIAGNOSIS — D61818 Other pancytopenia: Secondary | ICD-10-CM

## 2018-06-19 DIAGNOSIS — R0609 Other forms of dyspnea: Secondary | ICD-10-CM

## 2018-06-19 DIAGNOSIS — D46Z Other myelodysplastic syndromes: Secondary | ICD-10-CM

## 2018-06-19 DIAGNOSIS — R062 Wheezing: Secondary | ICD-10-CM

## 2018-06-19 DIAGNOSIS — G4733 Obstructive sleep apnea (adult) (pediatric): Secondary | ICD-10-CM

## 2018-06-19 DIAGNOSIS — D46C Myelodysplastic syndrome with isolated del(5q) chromosomal abnormality: Secondary | ICD-10-CM | POA: Diagnosis not present

## 2018-06-19 LAB — CBC WITH DIFFERENTIAL (CANCER CENTER ONLY)
Abs Immature Granulocytes: 0.01 10*3/uL (ref 0.00–0.07)
BASOS PCT: 1 %
Basophils Absolute: 0 10*3/uL (ref 0.0–0.1)
EOS ABS: 0.1 10*3/uL (ref 0.0–0.5)
Eosinophils Relative: 2 %
HCT: 30.5 % — ABNORMAL LOW (ref 39.0–52.0)
Hemoglobin: 9.7 g/dL — ABNORMAL LOW (ref 13.0–17.0)
IMMATURE GRANULOCYTES: 0 %
LYMPHS ABS: 0.8 10*3/uL (ref 0.7–4.0)
Lymphocytes Relative: 24 %
MCH: 36.1 pg — ABNORMAL HIGH (ref 26.0–34.0)
MCHC: 31.8 g/dL (ref 30.0–36.0)
MCV: 113.4 fL — ABNORMAL HIGH (ref 80.0–100.0)
MONO ABS: 0.6 10*3/uL (ref 0.1–1.0)
Monocytes Relative: 17 %
Neutro Abs: 1.9 10*3/uL (ref 1.7–7.7)
Neutrophils Relative %: 56 %
PLATELETS: 110 10*3/uL — AB (ref 150–400)
RBC: 2.69 MIL/uL — ABNORMAL LOW (ref 4.22–5.81)
RDW: 18.4 % — AB (ref 11.5–15.5)
WBC Count: 3.5 10*3/uL — ABNORMAL LOW (ref 4.0–10.5)
nRBC: 0 % (ref 0.0–0.2)

## 2018-06-19 LAB — CMP (CANCER CENTER ONLY)
ALT: 22 U/L (ref 10–47)
AST: 31 U/L (ref 11–38)
Albumin: 3.4 g/dL — ABNORMAL LOW (ref 3.5–5.0)
Alkaline Phosphatase: 81 U/L (ref 26–84)
Anion gap: 8 (ref 5–15)
BUN: 42 mg/dL — AB (ref 7–22)
CALCIUM: 9.5 mg/dL (ref 8.0–10.3)
CO2: 29 mmol/L (ref 18–33)
CREATININE: 1.3 mg/dL — AB (ref 0.60–1.20)
Chloride: 102 mmol/L (ref 98–108)
GLUCOSE: 121 mg/dL — AB (ref 73–118)
Potassium: 4.2 mmol/L (ref 3.3–4.7)
SODIUM: 139 mmol/L (ref 128–145)
Total Bilirubin: 1 mg/dL (ref 0.2–1.6)
Total Protein: 7.3 g/dL (ref 6.4–8.1)

## 2018-06-19 LAB — RETICULOCYTES
Immature Retic Fract: 11.3 % (ref 2.3–15.9)
RBC.: 2.69 MIL/uL — ABNORMAL LOW (ref 4.22–5.81)
RETIC COUNT ABSOLUTE: 38.7 10*3/uL (ref 19.0–186.0)
Retic Ct Pct: 1.4 % (ref 0.4–3.1)

## 2018-06-19 MED ORDER — DARBEPOETIN ALFA 300 MCG/0.6ML IJ SOSY
300.0000 ug | PREFILLED_SYRINGE | Freq: Once | INTRAMUSCULAR | Status: AC
  Administered 2018-06-19: 300 ug via SUBCUTANEOUS

## 2018-06-19 MED ORDER — IPRATROPIUM-ALBUTEROL 0.5-2.5 (3) MG/3ML IN SOLN
3.0000 mL | Freq: Four times a day (QID) | RESPIRATORY_TRACT | Status: DC
  Administered 2018-06-19: 3 mL via RESPIRATORY_TRACT

## 2018-06-19 MED ORDER — IPRATROPIUM-ALBUTEROL 0.5-2.5 (3) MG/3ML IN SOLN
RESPIRATORY_TRACT | Status: AC
  Filled 2018-06-19: qty 3

## 2018-06-19 MED ORDER — DARBEPOETIN ALFA 300 MCG/0.6ML IJ SOSY
PREFILLED_SYRINGE | INTRAMUSCULAR | Status: AC
  Filled 2018-06-19: qty 0.6

## 2018-06-19 NOTE — Telephone Encounter (Signed)
I spoke with Freda Munro, patients wife, and medication is scheduled to be delivered tomorrow 06/20/18.

## 2018-06-19 NOTE — Telephone Encounter (Signed)
Appts scheduled lmvm with date time/ letter/calendar mailed per 11/11 los

## 2018-06-19 NOTE — Progress Notes (Signed)
Hematology and Oncology Follow Up Visit  [name] [id number] 12/28/1931 84 y.o. 06/19/2018   Principle Diagnosis:   Myelodysplasia -- Low grade -- 5q-/+7 cytogenetics  (IPSS score = 0.5 --Intermediate-1 risk)  Current Therapy:    Aranesp 325mcg sq q 3 week for Hgb < 10  Revlimid 5 mg po q day (21 day on/7 day off) --start on 06/20/2018     Interim History:  [name] is back for his follow-up.  He is yet to start the Revlimid.  He says that there is been a hard time trying to get paid for.  I definitely can understand this.  As such, we will have this start tomorrow.  He is maintaining his blood count.  We have not had to transfuse him for a while which is encouraging.  He has had no bleeding.  He is eating okay.  There is been no problems with nausea or vomiting.  There is been no fever.  He has had no problems with change in his leg swelling.   Currently, his performance status is ECOG 3.   Medications:  Current Outpatient Medications:  .  allopurinol (ZYLOPRIM) 100 MG tablet, Take 2 tablets (200 mg total) by mouth daily., Disp: 30 tablet, Rfl: 0 .  aspirin EC 81 MG EC tablet, Take 1 tablet (81 mg total) by mouth daily., Disp: , Rfl:  .  diphenhydrAMINE-zinc acetate (BENADRYL) cream, Apply topically 2 (two) times daily as needed for itching., Disp: 28.4 g, Rfl: 0 .  dutasteride (AVODART) 0.5 MG capsule, Take 1 capsule (0.5 mg total) by mouth daily., Disp: 30 capsule, Rfl: 0 .  escitalopram (LEXAPRO) 5 MG tablet, Take 1 tablet (5 mg total) by mouth at bedtime., Disp: 30 tablet, Rfl: 0 .  furosemide (LASIX) 80 MG tablet, Take 1 tablet (80 mg total) by mouth 2 (two) times daily., Disp: 60 tablet, Rfl: 0 .  ipratropium-albuterol (DUONEB) 0.5-2.5 (3) MG/3ML SOLN, Take 3 mLs by nebulization every 4 (four) hours as needed., Disp: 360 mL, Rfl: 0 .  loratadine (CLARITIN) 10 MG tablet, Take 10 mg by mouth daily as needed for allergies., Disp: , Rfl:  .  mirtazapine  (REMERON) 15 MG tablet, Take 1 tablet (15 mg total) by mouth at bedtime., Disp: 30 tablet, Rfl: 0 .  Multiple Vitamin (MULTIVITAMIN WITH MINERALS) TABS tablet, Take 1 tablet by mouth daily., Disp: , Rfl:  .  Multiple Vitamins-Minerals (PRESERVISION AREDS 2) CAPS, Take 1 capsule by mouth daily., Disp: , Rfl:  .  OXYGEN, Inhale 2 L into the lungs daily., Disp: , Rfl:  .  pantoprazole (PROTONIX) 40 MG tablet, Take 1 tablet (40 mg total) by mouth daily., Disp: 30 tablet, Rfl: 0 .  potassium chloride (K-DUR) 10 MEQ tablet, Take 1 tablet (10 mEq total) by mouth 2 (two) times daily., Disp: 60 tablet, Rfl: 0 .  rOPINIRole (REQUIP) 4 MG tablet, Take 1 tablet (4 mg total) by mouth at bedtime., Disp: 30 tablet, Rfl: 0 .  tamsulosin (FLOMAX) 0.4 MG CAPS capsule, Take 1 capsule (0.4 mg total) by mouth daily after breakfast., Disp: 30 capsule, Rfl: 0 .  triamcinolone cream (KENALOG) 0.1 %, Apply 1 application topically 2 (two) times daily., Disp: 45 g, Rfl: 0  Allergies:  Allergies  Allergen Reactions  . Vicodin [Hydrocodone-Acetaminophen] Itching and Nausea Only    Past Medical History, Surgical history, Social history, and Family History were reviewed and updated.  Review of Systems: Review of Systems  Constitutional: Positive for  appetite change and fatigue.  HENT:  Negative.   Eyes: Negative.   Respiratory: Positive for shortness of breath.   Cardiovascular: Positive for leg swelling and palpitations.  Gastrointestinal: Positive for constipation.  Endocrine: Negative.   Genitourinary: Positive for frequency.   Musculoskeletal: Positive for arthralgias.  Skin: Negative.   Neurological: Negative.   Hematological: Bruises/bleeds easily.  Psychiatric/Behavioral: Negative.     Physical Exam:  weight is 184 lb (83.5 kg). His oral temperature is 98.2 F (36.8 C). His blood pressure is 106/60 and his pulse is 95. His respiration is 19 and oxygen saturation is 95%.   Wt Readings from Last 3  Encounters:  06/19/18 184 lb (83.5 kg)  05/29/18 186 lb (84.4 kg)  05/23/18 187 lb 6.4 oz (85 kg)    Physical Exam  Constitutional: He is oriented to person, place, and time.  HENT:  Head: Normocephalic and atraumatic.  Mouth/Throat: Oropharynx is clear and moist.  Eyes: Pupils are equal, round, and reactive to light. EOM are normal.  Neck: Normal range of motion.  Cardiovascular: Normal rate, regular rhythm and normal heart sounds.  Pulmonary/Chest: Effort normal and breath sounds normal.  Abdominal: Soft. Bowel sounds are normal.  Musculoskeletal: Normal range of motion. He exhibits no edema, tenderness or deformity.  Lymphadenopathy:    He has no cervical adenopathy.  Neurological: He is alert and oriented to person, place, and time.  Skin: Skin is warm and dry. No rash noted. No erythema.  Psychiatric: He has a normal mood and affect. His behavior is normal. Judgment and thought content normal.  Vitals reviewed.    Lab Results  Component Value Date   WBC 3.5 (L) 06/19/2018   HGB 9.7 (L) 06/19/2018   HCT 30.5 (L) 06/19/2018   MCV 113.4 (H) 06/19/2018   PLT 110 (L) 06/19/2018     Chemistry      Component Value Date/Time   NA 139 06/19/2018 1151   NA 145 05/15/2018   K 4.2 06/19/2018 1151   CL 102 06/19/2018 1151   CO2 29 06/19/2018 1151   BUN 42 (H) 06/19/2018 1151   BUN 47 (A) 05/15/2018   CREATININE 1.30 (H) 06/19/2018 1151   GLU 82 05/15/2018      Component Value Date/Time   CALCIUM 9.5 06/19/2018 1151   ALKPHOS 81 06/19/2018 1151   AST 31 06/19/2018 1151   ALT 22 06/19/2018 1151   BILITOT 1.0 06/19/2018 1151      Impression and Plan: [name] is a 84 year old white male.  He has myelodysplasia.  I think he has low-grade myelodysplasia.  His IPSS score is 0.5 so this puts him in a intermediate 1 category.  This has a 5-year survival of 3.5 years.   We will go ahead and give him a dose of Aranesp today.  I also want to give him a nebulizer.  He has  some wheezing bilaterally.  Hopefully, his family doctor or cardiologist will manage this.  He will start the Revlimid tomorrow.  We will see how he does with that.  I told him to take 162 mg of aspirin daily.  I would like to see him back in 3 weeks.  I think we have to stay on top of Mr. Lubitz so that we can try to prevent any issues from arising that would hospitalize him.     Volanda Napoleon, MD 11/11/20191:43 PM

## 2018-06-20 ENCOUNTER — Telehealth: Payer: Self-pay | Admitting: Cardiovascular Disease

## 2018-06-20 DIAGNOSIS — I5022 Chronic systolic (congestive) heart failure: Secondary | ICD-10-CM | POA: Diagnosis not present

## 2018-06-20 DIAGNOSIS — I130 Hypertensive heart and chronic kidney disease with heart failure and stage 1 through stage 4 chronic kidney disease, or unspecified chronic kidney disease: Secondary | ICD-10-CM | POA: Diagnosis not present

## 2018-06-20 DIAGNOSIS — I2510 Atherosclerotic heart disease of native coronary artery without angina pectoris: Secondary | ICD-10-CM | POA: Diagnosis not present

## 2018-06-20 DIAGNOSIS — I252 Old myocardial infarction: Secondary | ICD-10-CM | POA: Diagnosis not present

## 2018-06-20 DIAGNOSIS — N183 Chronic kidney disease, stage 3 (moderate): Secondary | ICD-10-CM | POA: Diagnosis not present

## 2018-06-20 DIAGNOSIS — D469 Myelodysplastic syndrome, unspecified: Secondary | ICD-10-CM | POA: Diagnosis not present

## 2018-06-20 LAB — IRON AND TIBC
IRON: 103 ug/dL (ref 42–163)
Saturation Ratios: 43 % (ref 20–55)
TIBC: 238 ug/dL (ref 202–409)
UIBC: 134 ug/dL (ref 117–376)

## 2018-06-20 LAB — FERRITIN: FERRITIN: 208 ng/mL (ref 24–336)

## 2018-06-20 NOTE — Telephone Encounter (Signed)
[employer] will do it all right. He needs to stick to the low salt straight and narrow and call us if his weight does not go back down to baseline by Friday morning. MCr

## 2018-06-20 NOTE — Telephone Encounter (Signed)
Spoke with pt who states his weight yesterday was 178 and today he is 183. He states he went to [employer] yesterday and had grilled shrimp and feels it could be related to that. Pt denies any swelling or SOB.   Pt instructed to limit salt intake and monitor weight and advised will route to MD for any further recommendations. Pt verbalized understanding.

## 2018-06-20 NOTE — Telephone Encounter (Signed)
NewMessage:    Clair Gulling from [place] calling concerning patient weight. Yesterday 178 morning and today 183.75 1:30 pm his lungs sound clear and no pitting adema reports feeling fine. Patient went to [employer] and had grill shrimp just FYI no need to call. Per [facility] Nurse.

## 2018-06-21 ENCOUNTER — Ambulatory Visit: Payer: Medicare Other | Admitting: Podiatry

## 2018-06-21 DIAGNOSIS — I5022 Chronic systolic (congestive) heart failure: Secondary | ICD-10-CM | POA: Diagnosis not present

## 2018-06-21 DIAGNOSIS — I2510 Atherosclerotic heart disease of native coronary artery without angina pectoris: Secondary | ICD-10-CM | POA: Diagnosis not present

## 2018-06-21 DIAGNOSIS — D469 Myelodysplastic syndrome, unspecified: Secondary | ICD-10-CM | POA: Diagnosis not present

## 2018-06-21 DIAGNOSIS — I130 Hypertensive heart and chronic kidney disease with heart failure and stage 1 through stage 4 chronic kidney disease, or unspecified chronic kidney disease: Secondary | ICD-10-CM | POA: Diagnosis not present

## 2018-06-21 DIAGNOSIS — I252 Old myocardial infarction: Secondary | ICD-10-CM | POA: Diagnosis not present

## 2018-06-21 DIAGNOSIS — N183 Chronic kidney disease, stage 3 (moderate): Secondary | ICD-10-CM | POA: Diagnosis not present

## 2018-06-21 NOTE — Telephone Encounter (Signed)
Pt's wife updated with Dr. Lurline Del recommendation. Pt verbalized understanding.

## 2018-06-22 DIAGNOSIS — I252 Old myocardial infarction: Secondary | ICD-10-CM | POA: Diagnosis not present

## 2018-06-22 DIAGNOSIS — N183 Chronic kidney disease, stage 3 (moderate): Secondary | ICD-10-CM | POA: Diagnosis not present

## 2018-06-22 DIAGNOSIS — I2510 Atherosclerotic heart disease of native coronary artery without angina pectoris: Secondary | ICD-10-CM | POA: Diagnosis not present

## 2018-06-22 DIAGNOSIS — D469 Myelodysplastic syndrome, unspecified: Secondary | ICD-10-CM | POA: Diagnosis not present

## 2018-06-22 DIAGNOSIS — I130 Hypertensive heart and chronic kidney disease with heart failure and stage 1 through stage 4 chronic kidney disease, or unspecified chronic kidney disease: Secondary | ICD-10-CM | POA: Diagnosis not present

## 2018-06-22 DIAGNOSIS — I5022 Chronic systolic (congestive) heart failure: Secondary | ICD-10-CM | POA: Diagnosis not present

## 2018-06-27 ENCOUNTER — Encounter (HOSPITAL_COMMUNITY): Payer: Self-pay | Admitting: Emergency Medicine

## 2018-06-27 ENCOUNTER — Other Ambulatory Visit: Payer: Self-pay

## 2018-06-27 ENCOUNTER — Telehealth: Payer: Self-pay | Admitting: Cardiovascular Disease

## 2018-06-27 ENCOUNTER — Emergency Department (HOSPITAL_COMMUNITY): Payer: Medicare Other

## 2018-06-27 ENCOUNTER — Inpatient Hospital Stay (HOSPITAL_COMMUNITY)
Admission: EM | Admit: 2018-06-27 | Discharge: 2018-07-09 | DRG: 193 | Disposition: A | Discharge status: Hospice | Payer: Medicare Other | Attending: Family Medicine | Admitting: Family Medicine

## 2018-06-27 DIAGNOSIS — Z7189 Other specified counseling: Secondary | ICD-10-CM | POA: Diagnosis not present

## 2018-06-27 DIAGNOSIS — E875 Hyperkalemia: Secondary | ICD-10-CM | POA: Diagnosis present

## 2018-06-27 DIAGNOSIS — J189 Pneumonia, unspecified organism: Secondary | ICD-10-CM

## 2018-06-27 DIAGNOSIS — I130 Hypertensive heart and chronic kidney disease with heart failure and stage 1 through stage 4 chronic kidney disease, or unspecified chronic kidney disease: Secondary | ICD-10-CM | POA: Diagnosis present

## 2018-06-27 DIAGNOSIS — R7989 Other specified abnormal findings of blood chemistry: Secondary | ICD-10-CM | POA: Diagnosis not present

## 2018-06-27 DIAGNOSIS — Z452 Encounter for adjustment and management of vascular access device: Secondary | ICD-10-CM | POA: Diagnosis not present

## 2018-06-27 DIAGNOSIS — G4733 Obstructive sleep apnea (adult) (pediatric): Secondary | ICD-10-CM | POA: Diagnosis present

## 2018-06-27 DIAGNOSIS — Z515 Encounter for palliative care: Secondary | ICD-10-CM | POA: Diagnosis not present

## 2018-06-27 DIAGNOSIS — Z950 Presence of cardiac pacemaker: Secondary | ICD-10-CM | POA: Diagnosis not present

## 2018-06-27 DIAGNOSIS — Z96653 Presence of artificial knee joint, bilateral: Secondary | ICD-10-CM | POA: Diagnosis present

## 2018-06-27 DIAGNOSIS — I50813 Acute on chronic right heart failure: Secondary | ICD-10-CM | POA: Diagnosis not present

## 2018-06-27 DIAGNOSIS — Z6833 Body mass index (BMI) 33.0-33.9, adult: Secondary | ICD-10-CM

## 2018-06-27 DIAGNOSIS — N183 Chronic kidney disease, stage 3 unspecified: Secondary | ICD-10-CM | POA: Diagnosis present

## 2018-06-27 DIAGNOSIS — Z9981 Dependence on supplemental oxygen: Secondary | ICD-10-CM

## 2018-06-27 DIAGNOSIS — I272 Pulmonary hypertension, unspecified: Secondary | ICD-10-CM | POA: Diagnosis present

## 2018-06-27 DIAGNOSIS — D46Z Other myelodysplastic syndromes: Secondary | ICD-10-CM | POA: Diagnosis present

## 2018-06-27 DIAGNOSIS — D61818 Other pancytopenia: Secondary | ICD-10-CM

## 2018-06-27 DIAGNOSIS — J9622 Acute and chronic respiratory failure with hypercapnia: Secondary | ICD-10-CM | POA: Diagnosis not present

## 2018-06-27 DIAGNOSIS — N179 Acute kidney failure, unspecified: Secondary | ICD-10-CM | POA: Diagnosis not present

## 2018-06-27 DIAGNOSIS — R570 Cardiogenic shock: Secondary | ICD-10-CM | POA: Diagnosis not present

## 2018-06-27 DIAGNOSIS — I482 Chronic atrial fibrillation, unspecified: Secondary | ICD-10-CM | POA: Diagnosis present

## 2018-06-27 DIAGNOSIS — I2721 Secondary pulmonary arterial hypertension: Secondary | ICD-10-CM | POA: Diagnosis present

## 2018-06-27 DIAGNOSIS — R778 Other specified abnormalities of plasma proteins: Secondary | ICD-10-CM

## 2018-06-27 DIAGNOSIS — I428 Other cardiomyopathies: Secondary | ICD-10-CM | POA: Diagnosis not present

## 2018-06-27 DIAGNOSIS — I495 Sick sinus syndrome: Secondary | ICD-10-CM | POA: Diagnosis present

## 2018-06-27 DIAGNOSIS — R338 Other retention of urine: Secondary | ICD-10-CM | POA: Diagnosis present

## 2018-06-27 DIAGNOSIS — N401 Enlarged prostate with lower urinary tract symptoms: Secondary | ICD-10-CM | POA: Diagnosis present

## 2018-06-27 DIAGNOSIS — Z9841 Cataract extraction status, right eye: Secondary | ICD-10-CM

## 2018-06-27 DIAGNOSIS — I5022 Chronic systolic (congestive) heart failure: Secondary | ICD-10-CM | POA: Diagnosis not present

## 2018-06-27 DIAGNOSIS — Z9842 Cataract extraction status, left eye: Secondary | ICD-10-CM

## 2018-06-27 DIAGNOSIS — I2510 Atherosclerotic heart disease of native coronary artery without angina pectoris: Secondary | ICD-10-CM | POA: Diagnosis present

## 2018-06-27 DIAGNOSIS — Y95 Nosocomial condition: Secondary | ICD-10-CM | POA: Diagnosis present

## 2018-06-27 DIAGNOSIS — D469 Myelodysplastic syndrome, unspecified: Secondary | ICD-10-CM | POA: Diagnosis present

## 2018-06-27 DIAGNOSIS — N170 Acute kidney failure with tubular necrosis: Secondary | ICD-10-CM | POA: Diagnosis not present

## 2018-06-27 DIAGNOSIS — K921 Melena: Secondary | ICD-10-CM | POA: Diagnosis not present

## 2018-06-27 DIAGNOSIS — J181 Lobar pneumonia, unspecified organism: Secondary | ICD-10-CM | POA: Diagnosis present

## 2018-06-27 DIAGNOSIS — R0602 Shortness of breath: Secondary | ICD-10-CM | POA: Diagnosis not present

## 2018-06-27 DIAGNOSIS — J9690 Respiratory failure, unspecified, unspecified whether with hypoxia or hypercapnia: Secondary | ICD-10-CM | POA: Diagnosis not present

## 2018-06-27 DIAGNOSIS — I5023 Acute on chronic systolic (congestive) heart failure: Secondary | ICD-10-CM | POA: Diagnosis not present

## 2018-06-27 DIAGNOSIS — Z79899 Other long term (current) drug therapy: Secondary | ICD-10-CM

## 2018-06-27 DIAGNOSIS — I252 Old myocardial infarction: Secondary | ICD-10-CM

## 2018-06-27 DIAGNOSIS — Z8249 Family history of ischemic heart disease and other diseases of the circulatory system: Secondary | ICD-10-CM

## 2018-06-27 DIAGNOSIS — R34 Anuria and oliguria: Secondary | ICD-10-CM | POA: Diagnosis present

## 2018-06-27 DIAGNOSIS — J440 Chronic obstructive pulmonary disease with acute lower respiratory infection: Secondary | ICD-10-CM | POA: Diagnosis present

## 2018-06-27 DIAGNOSIS — M109 Gout, unspecified: Secondary | ICD-10-CM | POA: Diagnosis present

## 2018-06-27 DIAGNOSIS — I5043 Acute on chronic combined systolic (congestive) and diastolic (congestive) heart failure: Secondary | ICD-10-CM | POA: Diagnosis present

## 2018-06-27 DIAGNOSIS — Z885 Allergy status to narcotic agent status: Secondary | ICD-10-CM

## 2018-06-27 DIAGNOSIS — J90 Pleural effusion, not elsewhere classified: Secondary | ICD-10-CM | POA: Diagnosis not present

## 2018-06-27 DIAGNOSIS — E162 Hypoglycemia, unspecified: Secondary | ICD-10-CM | POA: Diagnosis present

## 2018-06-27 DIAGNOSIS — Z7982 Long term (current) use of aspirin: Secondary | ICD-10-CM

## 2018-06-27 DIAGNOSIS — J9621 Acute and chronic respiratory failure with hypoxia: Secondary | ICD-10-CM | POA: Diagnosis not present

## 2018-06-27 DIAGNOSIS — I4821 Permanent atrial fibrillation: Secondary | ICD-10-CM | POA: Diagnosis present

## 2018-06-27 DIAGNOSIS — Z66 Do not resuscitate: Secondary | ICD-10-CM

## 2018-06-27 DIAGNOSIS — Z961 Presence of intraocular lens: Secondary | ICD-10-CM | POA: Diagnosis present

## 2018-06-27 DIAGNOSIS — Z22322 Carrier or suspected carrier of Methicillin resistant Staphylococcus aureus: Secondary | ICD-10-CM

## 2018-06-27 DIAGNOSIS — J168 Pneumonia due to other specified infectious organisms: Secondary | ICD-10-CM | POA: Diagnosis not present

## 2018-06-27 DIAGNOSIS — R0600 Dyspnea, unspecified: Secondary | ICD-10-CM

## 2018-06-27 DIAGNOSIS — Z87891 Personal history of nicotine dependence: Secondary | ICD-10-CM

## 2018-06-27 DIAGNOSIS — E669 Obesity, unspecified: Secondary | ICD-10-CM | POA: Diagnosis present

## 2018-06-27 DIAGNOSIS — E876 Hypokalemia: Secondary | ICD-10-CM | POA: Diagnosis present

## 2018-06-27 DIAGNOSIS — E785 Hyperlipidemia, unspecified: Secondary | ICD-10-CM | POA: Diagnosis present

## 2018-06-27 DIAGNOSIS — N171 Acute kidney failure with acute cortical necrosis: Secondary | ICD-10-CM | POA: Diagnosis not present

## 2018-06-27 DIAGNOSIS — I5082 Biventricular heart failure: Secondary | ICD-10-CM | POA: Diagnosis present

## 2018-06-27 DIAGNOSIS — K219 Gastro-esophageal reflux disease without esophagitis: Secondary | ICD-10-CM | POA: Diagnosis present

## 2018-06-27 DIAGNOSIS — D539 Nutritional anemia, unspecified: Secondary | ICD-10-CM | POA: Diagnosis present

## 2018-06-27 DIAGNOSIS — Z9119 Patient's noncompliance with other medical treatment and regimen: Secondary | ICD-10-CM

## 2018-06-27 DIAGNOSIS — G2581 Restless legs syndrome: Secondary | ICD-10-CM | POA: Diagnosis present

## 2018-06-27 DIAGNOSIS — I340 Nonrheumatic mitral (valve) insufficiency: Secondary | ICD-10-CM | POA: Diagnosis not present

## 2018-06-27 LAB — BASIC METABOLIC PANEL
Anion gap: 7 (ref 5–15)
BUN: 58 mg/dL — ABNORMAL HIGH (ref 8–23)
CALCIUM: 8.6 mg/dL — AB (ref 8.9–10.3)
CO2: 33 mmol/L — ABNORMAL HIGH (ref 22–32)
Chloride: 99 mmol/L (ref 98–111)
Creatinine, Ser: 2.02 mg/dL — ABNORMAL HIGH (ref 0.61–1.24)
GFR calc Af Amer: 33 mL/min — ABNORMAL LOW (ref >60)
GFR, EST NON AFRICAN AMERICAN: 29 mL/min — AB (ref >60)
GLUCOSE: 91 mg/dL (ref 70–99)
Potassium: 4.5 mmol/L (ref 3.5–5.1)
Sodium: 139 mmol/L (ref 135–145)

## 2018-06-27 LAB — CBC
HEMATOCRIT: 28.5 % — AB (ref 39.0–52.0)
Hemoglobin: 8.9 g/dL — ABNORMAL LOW (ref 13.0–17.0)
MCH: 36.9 pg — ABNORMAL HIGH (ref 26.0–34.0)
MCHC: 31.2 g/dL (ref 30.0–36.0)
MCV: 118.3 fL — ABNORMAL HIGH (ref 80.0–100.0)
NRBC: 1 % — AB (ref 0.0–0.2)
Platelets: 107 10*3/uL — ABNORMAL LOW (ref 150–400)
RBC: 2.41 MIL/uL — AB (ref 4.22–5.81)
RDW: 19.4 % — AB (ref 11.5–15.5)
WBC: 2.9 10*3/uL — ABNORMAL LOW (ref 4.0–10.5)

## 2018-06-27 LAB — I-STAT TROPONIN, ED: Troponin i, poc: 0.1 ng/mL (ref 0.00–0.08)

## 2018-06-27 LAB — I-STAT CG4 LACTIC ACID, ED: Lactic Acid, Venous: 0.75 mmol/L (ref 0.5–1.9)

## 2018-06-27 LAB — MAGNESIUM: Magnesium: 2.6 mg/dL — ABNORMAL HIGH (ref 1.7–2.4)

## 2018-06-27 LAB — BRAIN NATRIURETIC PEPTIDE: B Natriuretic Peptide: 1678.1 pg/mL — ABNORMAL HIGH (ref 0.0–100.0)

## 2018-06-27 LAB — TROPONIN I: Troponin I: 0.14 ng/mL (ref <0.03)

## 2018-06-27 MED ORDER — SODIUM CHLORIDE 0.9% FLUSH: 3.0000 mL | INTRAVENOUS | Status: DC | PRN

## 2018-06-27 MED ORDER — VANCOMYCIN HCL 10 G IV SOLR
1500.0000 mg | Freq: Once | INTRAVENOUS | Status: AC
  Administered 2018-06-27: 1500 mg via INTRAVENOUS
  Filled 2018-06-27: qty 1500

## 2018-06-27 MED ORDER — PANTOPRAZOLE SODIUM 40 MG PO TBEC
40.0000 mg | DELAYED_RELEASE_TABLET | Freq: Every day | ORAL | Status: DC
  Administered 2018-06-28 – 2018-07-09 (×12): 40 mg via ORAL
  Filled 2018-06-27 (×12): qty 1

## 2018-06-27 MED ORDER — IPRATROPIUM-ALBUTEROL 0.5-2.5 (3) MG/3ML IN SOLN: 3.0000 mL | RESPIRATORY_TRACT | Status: DC | PRN

## 2018-06-27 MED ORDER — SODIUM CHLORIDE 0.9% FLUSH
3.0000 mL | Freq: Two times a day (BID) | INTRAVENOUS | Status: DC
  Administered 2018-06-27 – 2018-07-08 (×22): 3 mL via INTRAVENOUS
  Administered 2018-07-08: 10 mL via INTRAVENOUS
  Administered 2018-07-09: 3 mL via INTRAVENOUS

## 2018-06-27 MED ORDER — TAMSULOSIN HCL 0.4 MG PO CAPS
0.4000 mg | ORAL_CAPSULE | Freq: Every day | ORAL | Status: DC
  Administered 2018-06-28 – 2018-07-01 (×4): 0.4 mg via ORAL
  Filled 2018-06-27 (×4): qty 1

## 2018-06-27 MED ORDER — AZITHROMYCIN 250 MG PO TABS
500.0000 mg | ORAL_TABLET | Freq: Once | ORAL | Status: AC
  Administered 2018-06-27: 500 mg via ORAL
  Filled 2018-06-27: qty 2

## 2018-06-27 MED ORDER — ROPINIROLE HCL 1 MG PO TABS
4.0000 mg | ORAL_TABLET | Freq: Every day | ORAL | Status: DC
  Administered 2018-06-27 – 2018-07-08 (×11): 4 mg via ORAL
  Filled 2018-06-27: qty 8
  Filled 2018-06-27: qty 4
  Filled 2018-06-27: qty 8
  Filled 2018-06-27 (×8): qty 4
  Filled 2018-06-27: qty 8

## 2018-06-27 MED ORDER — SODIUM CHLORIDE 0.9 % IV SOLN
1.0000 g | INTRAVENOUS | Status: AC
  Administered 2018-06-28 – 2018-07-05 (×8): 1 g via INTRAVENOUS
  Filled 2018-06-27 (×8): qty 1

## 2018-06-27 MED ORDER — DUTASTERIDE 0.5 MG PO CAPS
0.5000 mg | ORAL_CAPSULE | Freq: Every day | ORAL | Status: DC
  Administered 2018-06-28 – 2018-06-30 (×3): 0.5 mg via ORAL
  Filled 2018-06-27 (×6): qty 1

## 2018-06-27 MED ORDER — ALBUTEROL SULFATE (2.5 MG/3ML) 0.083% IN NEBU: 2.5000 mg | INHALATION_SOLUTION | Freq: Four times a day (QID) | RESPIRATORY_TRACT | Status: DC | PRN

## 2018-06-27 MED ORDER — SODIUM CHLORIDE 0.9 % IV BOLUS
500.0000 mL | Freq: Once | INTRAVENOUS | Status: AC
  Administered 2018-06-27: 500 mL via INTRAVENOUS

## 2018-06-27 MED ORDER — FUROSEMIDE 80 MG PO TABS
80.0000 mg | ORAL_TABLET | Freq: Two times a day (BID) | ORAL | Status: DC
  Administered 2018-06-28 (×2): 80 mg via ORAL
  Filled 2018-06-27 (×3): qty 1

## 2018-06-27 MED ORDER — SODIUM CHLORIDE 0.9 % IV SOLN
250.0000 mL | INTRAVENOUS | Status: DC | PRN
  Administered 2018-06-28 – 2018-07-07 (×4): 250 mL via INTRAVENOUS

## 2018-06-27 MED ORDER — LORATADINE 10 MG PO TABS: 10.0000 mg | ORAL_TABLET | Freq: Every day | ORAL | Status: DC | PRN

## 2018-06-27 MED ORDER — MIRTAZAPINE 15 MG PO TABS
15.0000 mg | ORAL_TABLET | Freq: Every day | ORAL | Status: DC
  Administered 2018-06-27 – 2018-06-30 (×4): 15 mg via ORAL
  Filled 2018-06-27 (×4): qty 1

## 2018-06-27 MED ORDER — POTASSIUM CHLORIDE ER 10 MEQ PO TBCR
10.0000 meq | EXTENDED_RELEASE_TABLET | Freq: Two times a day (BID) | ORAL | Status: DC
  Administered 2018-06-27 – 2018-06-30 (×6): 10 meq via ORAL
  Filled 2018-06-27 (×12): qty 1

## 2018-06-27 MED ORDER — SODIUM CHLORIDE 0.9 % IV SOLN
1.0000 g | Freq: Once | INTRAVENOUS | Status: AC
  Administered 2018-06-27: 1 g via INTRAVENOUS
  Filled 2018-06-27: qty 10

## 2018-06-27 MED ORDER — ESCITALOPRAM OXALATE 10 MG PO TABS
5.0000 mg | ORAL_TABLET | Freq: Every day | ORAL | Status: DC
  Administered 2018-06-27 – 2018-06-30 (×4): 5 mg via ORAL
  Filled 2018-06-27 (×4): qty 1

## 2018-06-27 MED ORDER — VANCOMYCIN HCL IN DEXTROSE 1-5 GM/200ML-% IV SOLN
1000.0000 mg | INTRAVENOUS | Status: DC
  Administered 2018-06-28: 1000 mg via INTRAVENOUS
  Filled 2018-06-27: qty 200

## 2018-06-27 NOTE — ED Triage Notes (Addendum)
Pt arrives to ED from home with complaints of shortness of breath since this morning. Pt family reports that pt has CHF and has increased swelling in is stomach. Pt family stated that pt has been more lethargic today and and has gained weight. Pt placed in position of comfort with bed locked and lowered, call bell in reach.

## 2018-06-27 NOTE — Progress Notes (Signed)
Pharmacy Antibiotic Note  [name]. is a 84 y.o. male admitted on 06/27/2018 with pneumonia.  Pharmacy has been consulted for vancomycin dosing.  Currently, afebrile and low WBC 2.9 secondary to Revlimid treatment for MDS. Patient has history of CKD III with BL Scr 1.3 mg/dL; noted to have AKI with Scr 2.02 mg/dL.    Plan: Vancomycin 1500 mg IV once, then 1000 mg IV q24h Cefepime 1 gm IV q24h per MD Monitor renal function and vancomycin trough at steady-state F/u cultures and LOT   Height: 5\' 8"  (172.7 cm) Weight: 190 lb (86.2 kg) IBW/kg (Calculated) : 68.4  Temp (24hrs), Avg:99.4 F (37.4 C), Min:99.4 F (37.4 C), Max:99.4 F (37.4 C)  Recent Labs  Lab 06/27/18 1910 06/27/18 2049  WBC 2.9*  --   CREATININE 2.02*  --   LATICACIDVEN  --  0.75    Estimated Creatinine Clearance: 29.1 mL/min (A) (by C-G formula based on SCr of 2.02 mg/dL (H)).    Allergies  Allergen Reactions  . Vicodin [Hydrocodone-Acetaminophen] Itching and Nausea Only    Antimicrobials this admission: Azithromycin 11/19 x1 Ceftriaxone 11/19 x1 Cefepime 11/20>> Vancomycin 11/19>>  Microbiology results: 11/19 BCx: 11/19 Resp:  Thank you for allowing pharmacy to be a part of this patient's care.  Willia Craze, Pharmacy Student

## 2018-06-27 NOTE — ED Provider Notes (Signed)
[facility] EMERGENCY DEPARTMENT Provider Note   CSN: [id number] Arrival date & time: 06/27/18  1843     History   Chief Complaint Chief Complaint  Patient presents with  . Shortness of Breath    HPI [name]. is a 84 y.o. male.  HPI Patient presents to the emergency room for evaluation of increasing fatigue today associated with weight gain with a known history of congestive heart failure.  Patient requested that his wife provide most of the history.  Patient has been fatigued over the last few days.  They have noticed some increasing weight gain as well as swelling in his abdomen.  Patient has seemed somewhat more short of breath although the patient denied that himself.  He denies any trouble with headache.  No abdominal pain.  No chest pain.  No vomiting or diarrhea.  He was concerned about his symptoms so they called his cardiologist.  Family members are also in the medical field and they felt that he had some crackles in his lungs.  They called his cardiologist who recommended he come to the emergency room for evaluation. Past Medical History:  Diagnosis Date  . Arthritis    "feet" (09/30/2017)  . CHF (congestive heart failure) ([place])   . Chronic bronchitis ([place])   . CKD (chronic kidney disease), stage III ([place])    [name] 09/30/2017  . Coronary artery disease   . Diverticulitis   . Dyspnea   . GERD (gastroesophageal reflux disease)   . Gout   . History of blood transfusion    "blood loss" (09/30/2017)  . Hyperlipidemia   . Hypertension   . MDS (myelodysplastic syndrome), high grade ([place]) 05/11/2018  . MI (myocardial infarction) ([place]) ~ 2000   "light one"  . Nonischemic cardiomyopathy (HCC)    mild  . OSA on CPAP   . Pacemaker single chamber, [place], 2013 10/13/2011  . Permanent atrial fibrillation    on Eliquis  . Pulmonary hypertension ([place])   . Restless legs     Patient Active Problem List   Diagnosis Date Noted    . Lethargy 05/16/2018  . MDS (myelodysplastic syndrome), high grade ([place]) 05/11/2018  . Advanced care planning/counseling discussion   . Goals of care, counseling/discussion   . Palliative care by specialist   . AKI (acute kidney injury) ([place])   . Acute on chronic systolic congestive heart failure ([place]) 05/03/2018  . Petechial rash 05/03/2018  . Biventricular cardiac pacemaker  [place]  dual chamber with LV in atrial port  03/29/2018  . Congestive heart failure, NYHA class 3, chronic, systolic ([place]) 61/60/7371  . GI bleed 11/03/2017  . Acute renal failure superimposed on stage 3 chronic kidney disease ([place]) 11/03/2017  . Chest pain 11/03/2017  . Pancytopenia ([place]) 09/26/2017  . CKD (chronic kidney disease), stage III ([place]) 09/26/2017  . Chronic pain 09/26/2017  . Acute on chronic respiratory failure with hypoxia ([place]) 09/26/2017  . Right shoulder injury, initial encounter 04/08/2017  . Chronic atrial fibrillation 11/23/2016  . Secondary hypercoagulable state ([place])   . Transaminitis   . PAH (pulmonary artery hypertension) ([place])   . Chronic systolic heart failure ([place]) 09/09/2015  . Poor short term memory 11/05/2013  . Fatigue 10/13/2011  . Dyspnea on exertion 10/13/2011  . Restless leg syndrome 10/13/2011  . Chronic anticoagulation 10/13/2011  . Nonischemic cardiomyopathy - coronaries by angiography 2010 10/13/2011  . OSA on CPAP 10/13/2011  . Tremor, hereditary, benign 10/13/2011  .  Depression 10/13/2011  . Other secondary pulmonary hypertension ([place]) 10/13/2011  . MRSA (methicillin resistant Staphylococcus aureus) carrier 10/13/2011  . BPH (benign prostatic hyperplasia) 10/13/2011    Past Surgical History:  Procedure Laterality Date  . APPENDECTOMY  12/2000   [name] 12/22/2010  . BIV UPGRADE N/A 02/22/2018   Procedure: BIV UPGRADE;  Surgeon: [name], MD;  Location: [facility] CV LAB;  Service: Cardiovascular;  Laterality: N/A;  . BIV UPGRADE N/A 03/29/2018    Procedure: BIV PPM UPGRADE;  Surgeon: [name], MD;  Location: [facility] CV LAB;  Service: Cardiovascular;  Laterality: N/A;  . CARDIAC CATHETERIZATION  11/07/2008   nonischemic cardiomyopathy,pulmonary hypertension  . CATARACT EXTRACTION W/ INTRAOCULAR LENS  IMPLANT, BILATERAL Bilateral   . CIRCUMCISION  12/2005   [name] 12/22/2010  . COLOSTOMY  12/2000   [name] 12/22/2010  . COLOSTOMY REVERSAL  07/2001   [name] 12/22/2010  . CORONARY ANGIOPLASTY  06/01/1999   successful to ostium of the first diagonal  . ESOPHAGOGASTRODUODENOSCOPY N/A 08/22/2013   Procedure: ESOPHAGOGASTRODUODENOSCOPY (EGD);  Surgeon: [name], MD;  Location: [facility] ENDOSCOPY;  Service: Endoscopy;  Laterality: N/A;  h/p in file cabinet, jackie  . ESOPHAGOGASTRODUODENOSCOPY N/A 11/05/2014   Procedure: ESOPHAGOGASTRODUODENOSCOPY (EGD);  Surgeon: Clarene Essex, MD;  Location: [facility] ENDOSCOPY;  Service: Endoscopy;  Laterality: N/A;  . ESOPHAGOGASTRODUODENOSCOPY N/A 11/08/2016   Procedure: ESOPHAGOGASTRODUODENOSCOPY (EGD);  Surgeon: [name], MD;  Location: [facility] ENDOSCOPY;  Service: Endoscopy;  Laterality: N/A;  . ESOPHAGOGASTRODUODENOSCOPY (EGD) WITH PROPOFOL Left 11/05/2017   Procedure: ESOPHAGOGASTRODUODENOSCOPY (EGD) WITH PROPOFOL;  Surgeon: [name], MD;  Location: [facility];  Service: Endoscopy;  Laterality: Left;  . HOT HEMOSTASIS N/A 11/05/2014   Procedure: HOT HEMOSTASIS (ARGON PLASMA COAGULATION/BICAP);  Surgeon: Clarene Essex, MD;  Location: [facility] ENDOSCOPY;  Service: Endoscopy;  Laterality: N/A;  . INSERT / REPLACE / REMOVE PACEMAKER    . JOINT REPLACEMENT    . MASS EXCISION Left    hand w/ulnar artery reconstruction/notes 12/22/2010  . NM MYOCAR PERF WALL MOTION  11/24/2007   normal  . PERMANENT PACEMAKER INSERTION  10/04/2012   [employer]  . PERMANENT PACEMAKER INSERTION N/A 10/12/2011   Procedure: PERMANENT PACEMAKER INSERTION;  Surgeon: [name], MD;  Location: [facility] CATH LAB;  Service:  Cardiovascular;  Laterality: N/A;  . REPLACEMENT TOTAL KNEE Bilateral 11/2006   right-left/notes 12/22/2010  . ROTATOR CUFF REPAIR Right 09/2004   [name] 12/22/2010  . SAVORY DILATION N/A 08/22/2013   Procedure: SAVORY DILATION;  Surgeon: [name], MD;  Location: [facility] ENDOSCOPY;  Service: Endoscopy;  Laterality: N/A;  . SHOULDER SURGERY Right    "fell off house; messed up 3 things in my arm"  . TONSILLECTOMY    . US ECHOCARDIOGRAPHY  02/01/2011   LA is mod-severely dilated,AOV & root sclerotic,ca+ AOV leaflets        Home Medications    Prior to Admission medications   Medication Sig Start Date End Date Taking? Authorizing Provider  acetaminophen (TYLENOL) 650 MG CR tablet Take 1,300 mg by mouth every 8 (eight) hours as needed for pain.   Yes [provider]  allopurinol (ZYLOPRIM) 100 MG tablet Take 2 tablets (200 mg total) by mouth daily. Patient taking differently: Take 200 mg by mouth daily as needed (for gout flares).  05/29/18  Yes Medina-Vargas, Monina C, NP  aspirin EC 81 MG EC tablet Take 1 tablet (81 mg total) by mouth daily. 05/12/18  Yes Alma Friendly, MD  Dextran 70-Hypromellose (  ARTIFICIAL TEARS PF OP) Place 2 drops into both eyes 3 (three) times daily as needed (for dryness).   Yes [provider]  diphenhydrAMINE-zinc acetate (BENADRYL) cream Apply topically 2 (two) times daily as needed for itching. 05/11/18  Yes Alma Friendly, MD  dutasteride (AVODART) 0.5 MG capsule Take 1 capsule (0.5 mg total) by mouth daily. 05/29/18  Yes Medina-Vargas, Monina C, NP  escitalopram (LEXAPRO) 5 MG tablet Take 1 tablet (5 mg total) by mouth at bedtime. 05/29/18  Yes Medina-Vargas, Monina C, NP  furosemide (LASIX) 80 MG tablet Take 1 tablet (80 mg total) by mouth 2 (two) times daily. 05/29/18  Yes Medina-Vargas, Monina C, NP  ipratropium-albuterol (DUONEB) 0.5-2.5 (3) MG/3ML SOLN Take 3 mLs by nebulization every 4 (four) hours as needed. Patient taking  differently: Take 3 mLs by nebulization every 4 (four) hours as needed (for wheezing or shortness of breath).  05/29/18  Yes Medina-Vargas, Monina C, NP  loratadine (CLARITIN) 10 MG tablet Take 10 mg by mouth daily as needed for allergies or itching.    Yes [provider]  mirtazapine (REMERON) 15 MG tablet Take 1 tablet (15 mg total) by mouth at bedtime. 05/29/18  Yes Medina-Vargas, Monina C, NP  Multiple Vitamin (MULTIVITAMIN WITH MINERALS) TABS tablet Take 1 tablet by mouth daily.   Yes [provider]  Multiple Vitamins-Minerals (PRESERVISION AREDS 2) CAPS Take 1 capsule by mouth daily.   Yes [provider]  OXYGEN Inhale 3 L into the lungs as needed (for shortness of breath).    Yes [provider]  pantoprazole (PROTONIX) 40 MG tablet Take 1 tablet (40 mg total) by mouth daily. 05/29/18  Yes Medina-Vargas, Monina C, NP  potassium chloride (K-DUR) 10 MEQ tablet Take 1 tablet (10 mEq total) by mouth 2 (two) times daily. 05/29/18  Yes Medina-Vargas, Monina C, NP  REVLIMID 5 MG capsule Take 5 mg by mouth See admin instructions. Take 5 mg by mouth once a day for 21 days, then "off" for 7 days, then repeat 06/16/18  Yes [provider]  rOPINIRole (REQUIP) 4 MG tablet Take 1 tablet (4 mg total) by mouth at bedtime. 05/29/18  Yes Medina-Vargas, Monina C, NP  tamsulosin (FLOMAX) 0.4 MG CAPS capsule Take 1 capsule (0.4 mg total) by mouth daily after breakfast. 05/29/18  Yes Medina-Vargas, Monina C, NP  triamcinolone cream (KENALOG) 0.1 % Apply 1 application topically 2 (two) times daily. Patient taking differently: Apply 1 application topically See admin instructions. Apply to itchy areas two times a day as directed 05/29/18  Yes Medina-Vargas, Monina C, NP  VENTOLIN HFA 108 (90 Base) MCG/ACT inhaler Inhale 2 puffs into the lungs every 6 (six) hours as needed for wheezing or shortness of breath.  06/20/18  Yes [provider]    Family  History Family History  Problem Relation Age of Onset  . Cancer Mother   . Heart attack Father   . Diabetes Brother     Social History Social History   Tobacco Use  . Smoking status: Former Research scientist (life sciences)  . Smokeless tobacco: Never Used  . Tobacco comment: 09/30/2017 "it's been over 12yr since I smoked anything"  Substance Use Topics  . Alcohol use: No  . Drug use: No     Allergies   Vicodin [hydrocodone-acetaminophen]   Review of Systems Review of Systems  All other systems reviewed and are negative.    Physical Exam Updated Vital Signs BP 98/64   Pulse 72   Temp  99.4 F (37.4 C) (Oral)   Resp 19   Ht 1.727 m (5\' 8" )   Wt 86.2 kg   SpO2 98%   BMI 28.89 kg/m   Physical Exam  Constitutional:  Non-toxic appearance. No distress.  HENT:  Head: Normocephalic and atraumatic.  Right Ear: External ear normal.  Left Ear: External ear normal.  Eyes: Conjunctivae are normal. Right eye exhibits no discharge. Left eye exhibits no discharge. No scleral icterus.  Neck: Neck supple. No tracheal deviation present.  Cardiovascular: Normal rate, regular rhythm and intact distal pulses.  Pulmonary/Chest: Effort normal. No stridor. No respiratory distress. He has no wheezes. He has rales in the right lower field and the left lower field.  Abdominal: Soft. Bowel sounds are normal. He exhibits no distension. There is no tenderness. There is no rebound and no guarding.  Possible mild edema in the abdomen  Musculoskeletal: He exhibits no tenderness.       Right lower leg: He exhibits edema.       Left lower leg: He exhibits edema.  Neurological: He is alert. He has normal strength. No cranial nerve deficit (no facial droop, extraocular movements intact, no slurred speech) or sensory deficit. He exhibits normal muscle tone. He displays no seizure activity. Coordination normal.  Skin: Skin is warm and dry. No rash noted.  Psychiatric: He has a normal mood and affect.  Nursing note and  vitals reviewed.    ED Treatments / Results  Labs (all labs ordered are listed, but only abnormal results are displayed) Labs Reviewed  BASIC METABOLIC PANEL - Abnormal; Notable for the following components:      Result Value   CO2 33 (*)    BUN 58 (*)    Creatinine, Ser 2.02 (*)    Calcium 8.6 (*)    GFR calc non Af Amer 29 (*)    GFR calc Af Amer 33 (*)    All other components within normal limits  MAGNESIUM - Abnormal; Notable for the following components:   Magnesium 2.6 (*)    All other components within normal limits  CBC - Abnormal; Notable for the following components:   WBC 2.9 (*)    RBC 2.41 (*)    Hemoglobin 8.9 (*)    HCT 28.5 (*)    MCV 118.3 (*)    MCH 36.9 (*)    RDW 19.4 (*)    Platelets 107 (*)    nRBC 1.0 (*)    All other components within normal limits  I-STAT TROPONIN, ED - Abnormal; Notable for the following components:   Troponin i, poc 0.10 (*)    All other components within normal limits  BRAIN NATRIURETIC PEPTIDE    EKG EKG Interpretation  Date/Time:  Tuesday June 27 2018 18:56:50 EST Ventricular Rate:  70 PR Interval:    QRS Duration: 179 QT Interval:  466 QTC Calculation: 503 R Axis:   170 Text Interpretation:  Electronic ventricular pacemaker No significant change since last tracing Confirmed by Dorie Rank [phone]) on 06/27/2018 7:13:31 PM   Radiology Dg Chest Portable 1 View  Result Date: 06/27/2018 CLINICAL DATA:  Shortness of breath EXAM: PORTABLE CHEST 1 VIEW COMPARISON:  May 03, 2018 FINDINGS: There is consolidation in the left base with small left pleural effusion. There is mild right base atelectasis. There is cardiomegaly. Pacemaker leads are attached the right ventricle and coronary sinus. There is aortic atherosclerosis. No adenopathy. There is postoperative change in the right shoulder. IMPRESSION: Consolidation left base with  small left pleural effusion. Suspect pneumonia left base. There is mild atelectasis in  the right base. Cardiomegaly with pacemaker leads stable. There is aortic atherosclerosis. No evident adenopathy. Aortic Atherosclerosis (ICD10-I70.0). Electronically Signed   By: Lowella Grip III M.D.   On: 06/27/2018 19:35    Procedures .Critical Care Performed by: Dorie Rank, MD Authorized by: Dorie Rank, MD   Critical care provider statement:    Critical care time (minutes):  45   Critical care was time spent personally by me on the following activities:  Discussions with consultants, evaluation of patient's response to treatment, examination of patient, ordering and performing treatments and interventions, ordering and review of laboratory studies, ordering and review of radiographic studies, pulse oximetry, re-evaluation of patient's condition, obtaining history from patient or surrogate and review of old charts   (including critical care time)  Medications Ordered in ED Medications  cefTRIAXone (ROCEPHIN) 1 g in sodium chloride 0.9 % 100 mL IVPB (has no administration in time range)  azithromycin (ZITHROMAX) tablet 500 mg (has no administration in time range)  sodium chloride 0.9 % bolus 500 mL (has no administration in time range)     Initial Impression / Assessment and Plan / ED Course  I have reviewed the triage vital signs and the nursing notes.  Pertinent labs & imaging results that were available during my care of the patient were reviewed by me and considered in my medical decision making (see chart for details).  Clinical Course as of Jun 27 2022  Tue Jun 27, 2018  2021 Chest x-ray suggest possible pneumonia.  Labs notable for persistent pancytopenia.  He also has evidence of an acute kidney injury with elevated BUN and creatinine.   [JK]  2022 Blood pressures remain soft.  Will order a fluid bolus   [JK]    Clinical Course User Index [JK] Dorie Rank, MD   Patient presents to the emergency room for evaluation of fatigue and some shortness of breath.  He does  have history of congestive heart failure and has noted some weight gain.  However he has elevated BUN and creatinine.  Chest x-ray is suggesting the possibility of pneumonia.  There may be a component of congestive heart failure as well with his weight gain and elevated troponin.  BMP is still pending.  I will order a small fluid bolus considering his mild hypotension.  I have ordered IV antibiotics.  Patient is not have clear sepsis as he is not febrile, does not have an elevated white cell count but it is certainly possible with his immunocompromise state.  I will add on a lactic acid level.  I will consult with the medical service for admission. Final Clinical Impressions(s) / ED Diagnoses   Final diagnoses:  Pneumonia of left lower lobe due to infectious organism ([place])  Elevated troponin  AKI (acute kidney injury) ([place])  Pancytopenia (HCC)       Dorie Rank, MD 06/27/18 2024

## 2018-06-27 NOTE — Progress Notes (Signed)
CRITICAL VALUE ALERT  Critical Value:  Trop 0.14  Date & Time Notied:  06/27/18 @11 .19 PM  Provider Notified: Provider on-call Via Amion  Orders Received/Actions taken: Waiting for response

## 2018-06-27 NOTE — Telephone Encounter (Signed)
New Message   Pt c/o swelling: STAT is pt has developed SOB within 24 hours  1) How much weight have you gained and in what time span? 11/3 177, today 11/19 190 patient states that he has gain 13lb in 16 days   2) If swelling, where is the swelling located? Eyes and bottom of his bell  3) Are you currently taking a fluid pill? yes  4) Are you currently SOB? No   5) Do you have a log of your daily weights (if so, list)? no  6) Have you gained 3 pounds in a day or 5 pounds in a week? Unknown   7) Have you traveled recently? No

## 2018-06-27 NOTE — ED Notes (Signed)
ED Provider at bedside. 

## 2018-06-27 NOTE — Telephone Encounter (Signed)
Spoke with patients son who is EMT patient has crackles lower bases. He was sleeping when son got there and on 3L oxygen and CPAP his O2 Sats 85%. Patient has lower extremity edema but not pitting. Weight is up from 177 06/11/18 to 190 today. Per son patient is more lethargic than normal, unlike him to want to nap a couple hours after getting up. Son did give patient Lasix 40 mg now. Blood pressure 142/76 HR 70. Discussed with Janan Ridge PA who saw patient last and ED recommended. Advised patients son and wife, verbalized understanding. Trish made aware

## 2018-06-27 NOTE — H&P (Signed)
History and Physical    [name] JME:[id number] DOB: 1932-04-15 DOA: 06/27/2018  PCP: [name], MD  Patient coming from: Home  Chief Complaint: Shortness of breath  HPI: Anthony BARTOL Sr. is a 84 y.o. male with medical history significant of chronic respiratory failure on 3 L of oxygen at home, COPD, congestive heart failure, chronic kidney disease with baseline creatinine 1.3 comes in with several days of worsening weakness and shortness of breath.  He denies any chest pain or any abdominal pain.  He feels like his belly is getting bigger but denies any lower extremity edema or swelling.  Denies any fevers or chills.  Patient brought in by family members due to his fatigue and shortness of breath patient found to have pneumonia referred for admission for such.  Wife reports his O2 sats were in the 59s and 70s at home on his 3 L.  He is been having much more dyspnea with exertion than normal.  Denies any sick contacts.  Review of Systems: As per HPI otherwise 10 point review of systems negative.   Past Medical History:  Diagnosis Date  . Arthritis    "feet" (09/30/2017)  . CHF (congestive heart failure) ([place])   . Chronic bronchitis ([place])   . CKD (chronic kidney disease), stage III ([place])    [name] 09/30/2017  . Coronary artery disease   . Diverticulitis   . Dyspnea   . GERD (gastroesophageal reflux disease)   . Gout   . History of blood transfusion    "blood loss" (09/30/2017)  . Hyperlipidemia   . Hypertension   . MDS (myelodysplastic syndrome), high grade ([place]) 05/11/2018  . MI (myocardial infarction) ([place]) ~ 2000   "light one"  . Nonischemic cardiomyopathy (HCC)    mild  . OSA on CPAP   . Pacemaker single chamber, [place], 2013 10/13/2011  . Permanent atrial fibrillation    on Eliquis  . Pulmonary hypertension ([place])   . Restless legs     Past Surgical History:  Procedure Laterality Date  . APPENDECTOMY  12/2000   [name] 12/22/2010    . BIV UPGRADE N/A 02/22/2018   Procedure: BIV UPGRADE;  Surgeon: [name], MD;  Location: [facility];  Service: Cardiovascular;  Laterality: N/A;  . BIV UPGRADE N/A 03/29/2018   Procedure: BIV PPM UPGRADE;  Surgeon: [name], MD;  Location: [facility];  Service: Cardiovascular;  Laterality: N/A;  . CARDIAC CATHETERIZATION  11/07/2008   nonischemic cardiomyopathy,pulmonary hypertension  . CATARACT EXTRACTION W/ INTRAOCULAR LENS  IMPLANT, BILATERAL Bilateral   . CIRCUMCISION  12/2005   [name] 12/22/2010  . COLOSTOMY  12/2000   [name] 12/22/2010  . COLOSTOMY REVERSAL  07/2001   [name] 12/22/2010  . CORONARY ANGIOPLASTY  06/01/1999   successful to ostium of the first diagonal  . ESOPHAGOGASTRODUODENOSCOPY N/A 08/22/2013   Procedure: ESOPHAGOGASTRODUODENOSCOPY (EGD);  Surgeon: [name], MD;  Location: [facility] ENDOSCOPY;  Service: Endoscopy;  Laterality: N/A;  h/p in file cabinet, jackie  . ESOPHAGOGASTRODUODENOSCOPY N/A 11/05/2014   Procedure: ESOPHAGOGASTRODUODENOSCOPY (EGD);  Surgeon: Clarene Essex, MD;  Location: [facility] ENDOSCOPY;  Service: Endoscopy;  Laterality: N/A;  . ESOPHAGOGASTRODUODENOSCOPY N/A 11/08/2016   Procedure: ESOPHAGOGASTRODUODENOSCOPY (EGD);  Surgeon: [name], MD;  Location: [facility] ENDOSCOPY;  Service: Endoscopy;  Laterality: N/A;  . ESOPHAGOGASTRODUODENOSCOPY (EGD) WITH PROPOFOL Left 11/05/2017   Procedure: ESOPHAGOGASTRODUODENOSCOPY (EGD) WITH PROPOFOL;  Surgeon: [name], MD;  Location: [facility];  Service: Endoscopy;  Laterality: Left;  . HOT HEMOSTASIS N/A 11/05/2014   Procedure: HOT HEMOSTASIS (ARGON PLASMA COAGULATION/BICAP);  Surgeon: Clarene Essex, MD;  Location: [facility] ENDOSCOPY;  Service: Endoscopy;  Laterality: N/A;  . INSERT / REPLACE / REMOVE PACEMAKER    . JOINT REPLACEMENT    . MASS EXCISION Left    hand w/ulnar artery reconstruction/notes 12/22/2010  . NM MYOCAR PERF WALL MOTION  11/24/2007   normal  . PERMANENT PACEMAKER INSERTION   10/04/2012   [employer]  . PERMANENT PACEMAKER INSERTION N/A 10/12/2011   Procedure: PERMANENT PACEMAKER INSERTION;  Surgeon: [name], MD;  Location: [facility] C[facility] LAB;  Service: Cardiovascular;  Laterality: N/A;  . REPLACEMENT TOTAL KNEE Bilateral 11/2006   right-left/notes 12/22/2010  . ROTATOR CUFF REPAIR Right 09/2004   [name] 12/22/2010  . SAVORY DILATION N/A 08/22/2013   Procedure: SAVORY DILATION;  Surgeon: [name], MD;  Location: [facility] ENDOSCOPY;  Service: Endoscopy;  Laterality: N/A;  . SHOULDER SURGERY Right    "fell off house; messed up 3 things in my arm"  . TONSILLECTOMY    . US ECHOCARDIOGRAPHY  02/01/2011   LA is mod-severely dilated,AOV & root sclerotic,ca+ AOV leaflets     reports that he has quit smoking. He has never used smokeless tobacco. He reports that he does not drink alcohol or use drugs.  Allergies  Allergen Reactions  . Vicodin [Hydrocodone-Acetaminophen] Itching and Nausea Only    Family History  Problem Relation Age of Onset  . Cancer Mother   . Heart attack Father   . Diabetes Brother     Prior to Admission medications   Medication Sig Start Date End Date Taking? Authorizing Provider  acetaminophen (TYLENOL) 650 MG CR tablet Take 1,300 mg by mouth every 8 (eight) hours as needed for pain.   Yes [provider]  allopurinol (ZYLOPRIM) 100 MG tablet Take 2 tablets (200 mg total) by mouth daily. Patient taking differently: Take 200 mg by mouth daily as needed (for gout flares).  05/29/18  Yes Medina-Vargas, Monina C, NP  aspirin EC 81 MG EC tablet Take 1 tablet (81 mg total) by mouth daily. 05/12/18  Yes Alma Friendly, MD  Dextran 70-Hypromellose (ARTIFICIAL TEARS PF OP) Place 2 drops into both eyes 3 (three) times daily as needed (for dryness).   Yes [provider]  diphenhydrAMINE-zinc acetate (BENADRYL) cream Apply topically 2 (two) times daily as needed for itching. 05/11/18  Yes Alma Friendly, MD  dutasteride  (AVODART) 0.5 MG capsule Take 1 capsule (0.5 mg total) by mouth daily. 05/29/18  Yes Medina-Vargas, Monina C, NP  escitalopram (LEXAPRO) 5 MG tablet Take 1 tablet (5 mg total) by mouth at bedtime. 05/29/18  Yes Medina-Vargas, Monina C, NP  furosemide (LASIX) 80 MG tablet Take 1 tablet (80 mg total) by mouth 2 (two) times daily. 05/29/18  Yes Medina-Vargas, Monina C, NP  ipratropium-albuterol (DUONEB) 0.5-2.5 (3) MG/3ML SOLN Take 3 mLs by nebulization every 4 (four) hours as needed. Patient taking differently: Take 3 mLs by nebulization every 4 (four) hours as needed (for wheezing or shortness of breath).  05/29/18  Yes Medina-Vargas, Monina C, NP  loratadine (CLARITIN) 10 MG tablet Take 10 mg by mouth daily as needed for allergies or itching.    Yes [provider]  mirtazapine (REMERON) 15 MG tablet Take 1 tablet (15 mg total) by mouth at bedtime. 05/29/18  Yes Medina-Vargas, Monina C, NP  Multiple Vitamin (MULTIVITAMIN WITH MINERALS) TABS tablet Take 1 tablet by mouth  daily.   Yes [provider]  Multiple Vitamins-Minerals (PRESERVISION AREDS 2) CAPS Take 1 capsule by mouth daily.   Yes [provider]  OXYGEN Inhale 3 L into the lungs as needed (for shortness of breath).    Yes [provider]  pantoprazole (PROTONIX) 40 MG tablet Take 1 tablet (40 mg total) by mouth daily. 05/29/18  Yes Medina-Vargas, Monina C, NP  potassium chloride (K-DUR) 10 MEQ tablet Take 1 tablet (10 mEq total) by mouth 2 (two) times daily. 05/29/18  Yes Medina-Vargas, Monina C, NP  REVLIMID 5 MG capsule Take 5 mg by mouth See admin instructions. Take 5 mg by mouth once a day for 21 days, then "off" for 7 days, then repeat 06/16/18  Yes [provider]  rOPINIRole (REQUIP) 4 MG tablet Take 1 tablet (4 mg total) by mouth at bedtime. 05/29/18  Yes Medina-Vargas, Monina C, NP  tamsulosin (FLOMAX) 0.4 MG CAPS capsule Take 1 capsule (0.4 mg total) by mouth daily after breakfast.  05/29/18  Yes Medina-Vargas, Monina C, NP  triamcinolone cream (KENALOG) 0.1 % Apply 1 application topically 2 (two) times daily. Patient taking differently: Apply 1 application topically See admin instructions. Apply to itchy areas two times a day as directed 05/29/18  Yes Medina-Vargas, Monina C, NP  VENTOLIN HFA 108 (90 Base) MCG/ACT inhaler Inhale 2 puffs into the lungs every 6 (six) hours as needed for wheezing or shortness of breath.  06/20/18  Yes [provider]    Physical Exam: Vitals:   06/27/18 1901 06/27/18 1915 06/27/18 2000 06/27/18 2015  BP:  98/64 100/60 96/65  Pulse:  72 70 70  Resp:  19 18 19   Temp: 99.4 F (37.4 C)     TempSrc: Oral     SpO2:  98% 97% 99%  Weight:      Height:          Constitutional: NAD, calm, comfortable Vitals:   06/27/18 1901 06/27/18 1915 06/27/18 2000 06/27/18 2015  BP:  98/64 100/60 96/65  Pulse:  72 70 70  Resp:  19 18 19   Temp: 99.4 F (37.4 C)     TempSrc: Oral     SpO2:  98% 97% 99%  Weight:      Height:       Eyes: PERRL, lids and conjunctivae normal ENMT: Mucous membranes are moist. Posterior pharynx clear of any exudate or lesions.Normal dentition.  Neck: normal, supple, no masses, no thyromegaly Respiratory: clear to auscultation bilaterally, no wheezing, no crackles. Normal respiratory effort. No accessory muscle use.  Cardiovascular: Regular rate and rhythm, no murmurs / rubs / gallops. No extremity edema. 2+ pedal pulses. No carotid bruits.  Abdomen: no tenderness, no masses palpated. No hepatosplenomegaly. Bowel sounds positive.  Musculoskeletal: no clubbing / cyanosis. No joint deformity upper and lower extremities. Good ROM, no contractures. Normal muscle tone.  Skin: no rashes, lesions, ulcers. No induration Neurologic: CN 2-12 grossly intact. Sensation intact, DTR normal. Strength 5/5 in all 4.  Psychiatric: Normal judgment and insight. Alert and oriented x 3. Normal mood.    Labs on Admission: I have  personally reviewed following labs and imaging studies  CBC: Recent Labs  Lab 06/27/18 1910  WBC 2.9*  HGB 8.9*  HCT 28.5*  MCV 118.3*  PLT 016*   Basic Metabolic Panel: Recent Labs  Lab 06/27/18 1910  NA 139  K 4.5  CL 99  CO2 33*  GLUCOSE 91  BUN 58*  CREATININE 2.02*  CALCIUM  8.6*  MG 2.6*   GFR: Estimated Creatinine Clearance: 29.1 mL/min (A) (by C-G formula based on SCr of 2.02 mg/dL (H)). Liver Function Tests: No results for input(s): AST, ALT, ALKPHOS, BILITOT, PROT, ALBUMIN in the last 168 hours. No results for input(s): LIPASE, AMYLASE in the last 168 hours. No results for input(s): AMMONIA in the last 168 hours. Coagulation Profile: No results for input(s): INR, PROTIME in the last 168 hours. Cardiac Enzymes: No results for input(s): CKTOTAL, CKMB, CKMBINDEX, TROPONINI in the last 168 hours. BNP (last 3 results) No results for input(s): PROBNP in the last 8760 hours. HbA1C: No results for input(s): HGBA1C in the last 72 hours. CBG: No results for input(s): GLUCAP in the last 168 hours. Lipid Profile: No results for input(s): CHOL, HDL, LDLCALC, TRIG, CHOLHDL, LDLDIRECT in the last 72 hours. Thyroid Function Tests: No results for input(s): TSH, T4TOTAL, FREET4, T3FREE, THYROIDAB in the last 72 hours. Anemia Panel: No results for input(s): VITAMINB12, FOLATE, FERRITIN, TIBC, IRON, RETICCTPCT in the last 72 hours. Urine analysis:    Component Value Date/Time   COLORURINE YELLOW 05/05/2018 2247   APPEARANCEUR CLEAR 05/05/2018 2247   LABSPEC 1.008 05/05/2018 2247   PHURINE 5.0 05/05/2018 2247   GLUCOSEU NEGATIVE 05/05/2018 2247   HGBUR NEGATIVE 05/05/2018 2247   BILIRUBINUR NEGATIVE 05/05/2018 2247   KETONESUR NEGATIVE 05/05/2018 2247   PROTEINUR NEGATIVE 05/05/2018 2247   NITRITE NEGATIVE 05/05/2018 2247   LEUKOCYTESUR NEGATIVE 05/05/2018 2247   Sepsis Labs:  !!!!!!!!!!!!!!!!!!!!!!!!!!!!!!!!!!!!!!!!!!!! @LABRCNTIP (procalcitonin:4,lacticidven:4) )No results found for this or any previous visit (from the past 240 hour(s)).   Radiological Exams on Admission: Dg Chest Portable 1 View  Result Date: 06/27/2018 CLINICAL DATA:  Shortness of breath EXAM: PORTABLE CHEST 1 VIEW COMPARISON:  May 03, 2018 FINDINGS: There is consolidation in the left base with small left pleural effusion. There is mild right base atelectasis. There is cardiomegaly. Pacemaker leads are attached the right ventricle and coronary sinus. There is aortic atherosclerosis. No adenopathy. There is postoperative change in the right shoulder. IMPRESSION: Consolidation left base with small left pleural effusion. Suspect pneumonia left base. There is mild atelectasis in the right base. Cardiomegaly with pacemaker leads stable. There is aortic atherosclerosis. No evident adenopathy. Aortic Atherosclerosis (ICD10-I70.0). Electronically Signed   By: Lowella Grip III M.D.   On: 06/27/2018 19:35    EKG: Independently reviewed.  Paced rhythm Chest x-ray reviewed left lower lung infiltrate no edema Old chart reviewed Case discussed with Dr. Marye Round in the ED  Assessment/Plan 84 year old male with acute on chronic respiratory failure secondary to pneumonia Principal Problem:   PNA (pneumonia)-patient recently hospitalized therefore placed on cefepime and vancomycin.  Obtain blood cultures and sputum cultures.  Increase oxygen to maintain his O2 sats above 88%.  Lactic acid level is pending.  Placed on pneumonia pathway.  Currently in no respiratory distress.  Not septic.  Active Problems:   Nonischemic cardiomyopathy - coronaries by angiography 2010-noted continue his chronic Lasix dosing    Chronic systolic heart failure (HCC)-continue chronic Lasix dosing    Chronic atrial fibrillation-currently rate controlled and paced    Acute on chronic respiratory failure with hypoxia  (HCC)-family reports hypoxia at home.  Monitor O2 sats baseline on 3 L of oxygen at home.  Maintain O2 sats above 88%.    Acute renal failure superimposed on stage 3 chronic kidney disease (HCC)-creatinine little bumped from 1.3-2 today.  Monitor daily.  No IV fluids at this time due to his history of  CHF as to avoid volume overload.    Biventricular cardiac pacemaker  [employer]  dual chamber with LV in atrial port -noted      DVT prophylaxis: SCDs Code Status: Full Family Communication: Wife Disposition Plan: Several days Consults called: None Admission status: Admission   [name] A MD [facility]  If 7PM-7AM, please contact night-coverage [email or web address] Password [facility]  06/27/2018, 8:57 PM

## 2018-06-28 ENCOUNTER — Other Ambulatory Visit: Payer: Self-pay

## 2018-06-28 DIAGNOSIS — D61818 Other pancytopenia: Secondary | ICD-10-CM

## 2018-06-28 DIAGNOSIS — N171 Acute kidney failure with acute cortical necrosis: Secondary | ICD-10-CM

## 2018-06-28 LAB — CBC WITH DIFFERENTIAL/PLATELET
Abs Immature Granulocytes: 0.01 10*3/uL (ref 0.00–0.07)
Basophils Absolute: 0 10*3/uL (ref 0.0–0.1)
Basophils Relative: 0 %
EOS ABS: 0.1 10*3/uL (ref 0.0–0.5)
EOS PCT: 2 %
HCT: 26.7 % — ABNORMAL LOW (ref 39.0–52.0)
HEMOGLOBIN: 8.2 g/dL — AB (ref 13.0–17.0)
Immature Granulocytes: 0 %
LYMPHS PCT: 17 %
Lymphs Abs: 0.5 10*3/uL — ABNORMAL LOW (ref 0.7–4.0)
MCH: 36.1 pg — AB (ref 26.0–34.0)
MCHC: 30.7 g/dL (ref 30.0–36.0)
MCV: 117.6 fL — ABNORMAL HIGH (ref 80.0–100.0)
MONO ABS: 0.2 10*3/uL (ref 0.1–1.0)
Monocytes Relative: 6 %
Neutro Abs: 2.2 10*3/uL (ref 1.7–7.7)
Neutrophils Relative %: 75 %
Platelets: 95 10*3/uL — ABNORMAL LOW (ref 150–400)
RBC: 2.27 MIL/uL — AB (ref 4.22–5.81)
RDW: 19.3 % — AB (ref 11.5–15.5)
WBC: 3 10*3/uL — AB (ref 4.0–10.5)
nRBC: 1.3 % — ABNORMAL HIGH (ref 0.0–0.2)

## 2018-06-28 LAB — BASIC METABOLIC PANEL
Anion gap: 8 (ref 5–15)
BUN: 58 mg/dL — AB (ref 8–23)
CALCIUM: 8.1 mg/dL — AB (ref 8.9–10.3)
CHLORIDE: 99 mmol/L (ref 98–111)
CO2: 30 mmol/L (ref 22–32)
CREATININE: 1.84 mg/dL — AB (ref 0.61–1.24)
GFR, EST AFRICAN AMERICAN: 37 mL/min — AB (ref >60)
GFR, EST NON AFRICAN AMERICAN: 32 mL/min — AB (ref >60)
Glucose, Bld: 108 mg/dL — ABNORMAL HIGH (ref 70–99)
Potassium: 4.2 mmol/L (ref 3.5–5.1)
SODIUM: 137 mmol/L (ref 135–145)

## 2018-06-28 LAB — TROPONIN I
Troponin I: 0.1 ng/mL (ref <0.03)
Troponin I: 0.09 ng/mL (ref <0.03)

## 2018-06-28 LAB — MRSA PCR SCREENING: MRSA BY PCR: NEGATIVE

## 2018-06-28 MED ORDER — LENALIDOMIDE 5 MG PO CAPS
5.0000 mg | ORAL_CAPSULE | Freq: Every day | ORAL | Status: DC
  Administered 2018-06-28 – 2018-07-07 (×9): 5 mg via ORAL
  Filled 2018-06-28 (×13): qty 1

## 2018-06-28 NOTE — Progress Notes (Signed)
PROGRESS NOTE  [name] OIN:[id number] DOB: 1932-08-01 DOA: 06/27/2018 PCP: [name], MD   LOS: 1 day   Brief Narrative / Interim history: 84 year old male with history of COPD with chronic hypoxic respiratory failure on supplemental oxygen with 3 L at home, chronic systolic CHF with most recent EF 25-30% in June 2019, chronic kidney disease stage III with baseline creatinine around 1.3, pancytopenia due to MDS, admitted to the hospital on 11/19 after several days of progressive weakness and shortness of breath.  He denies any chest pain or abdominal pain.  Denies any fever or chills.  Reports a dry cough.  Family on admission reported that his O2 sats at home was in the 60-70s on home 3 L.  Chest x-ray on admission showed small left pleural effusion with consolidation, suspicious for infiltrate.  He was placed on fact cefepime and was admitted to the hospital with leading diagnosis of pneumonia.  Subjective: -Denies shortness of breath at rest, denies chest pain.  Still with significant weakness and significant dyspnea with minimal activity, a lot worse than his normal, and appears slightly tachypneic  Assessment & Plan: Principal Problem:   PNA (pneumonia) Active Problems:   Nonischemic cardiomyopathy - coronaries by angiography 7096   Chronic systolic heart failure (HCC)   Chronic atrial fibrillation   Acute on chronic respiratory failure with hypoxia (HCC)   Acute renal failure superimposed on stage 3 chronic kidney disease (HCC)   Biventricular cardiac pacemaker  st Judes  dual chamber with LV in atrial port    Acute on chronic hypoxic respiratory failure, multifactorial due to lobar pneumonia acute on chronic systolic CHF /nonischemic cardiomyopathy -Patient was placed on vancomycin and cefepime for infiltrate on chest x-ray, continue for 24 hours, if cultures remain negative will discontinue vancomycin tomorrow -He is immunosuppressed in the setting of his MDS,  continue to closely monitor fever curve and clinical symptoms -Evidence of fluid overload with lower extremity edema with bibasilar crackles, continue Lasix, avoid too aggressive diuresis due to intermittent hypotension  Chronic atrial fibrillation -With presence of biventricular pacer currently, not on anticoagulation due to prior GI bleed history -Rhythm is paced on telemetry  Acute kidney injury on chronic kidney disease stage III -Baseline creatinine around 1.3, 2.0 last night and admission and improved to 1.84 this morning, monitor urine output while getting Lasix.  He is slightly fluid overloaded and will not give IV fluids  Pancytopenia due to myelodysplastic syndrome -Labs overall stable, no need for transfusions, monitor daily  BPH -Continue home medications  OSA on CPAP -Continue nightly CPAP  Of note, palliative was consulted during his prior hospital stay remains full code and continue current management   Scheduled Meds: . dutasteride  0.5 mg Oral Daily  . escitalopram  5 mg Oral QHS  . furosemide  80 mg Oral BID  . mirtazapine  15 mg Oral QHS  . pantoprazole  40 mg Oral Daily  . potassium chloride  10 mEq Oral BID  . rOPINIRole  4 mg Oral QHS  . sodium chloride flush  3 mL Intravenous Q12H  . tamsulosin  0.4 mg Oral QPC breakfast   Continuous Infusions: . sodium chloride    . ceFEPime (MAXIPIME) IV 1 g (06/28/18 0146)  . vancomycin     PRN Meds:.sodium chloride, albuterol, ipratropium-albuterol, loratadine, sodium chloride flush  DVT prophylaxis: SCDs Code Status: Full code Family Communication: No family present at bedside Disposition Plan: Likely home versus SNF when ready  Consultants:  None  Procedures:   None   Antimicrobials:  Vancomycin 11/19 >>  Cefepime 11/19 >>  Objective: Vitals:   06/28/18 0114 06/28/18 0117 06/28/18 0252 06/28/18 0537  BP:  92/63  (!) 94/59  Pulse: 73 73  65  Resp: 20 20  20   Temp:  97.9 F (36.6 C)  97.6  F (36.4 C)  TempSrc:  Oral  Oral  SpO2: 93% 95%  (!) 85%  Weight:   79.2 kg   Height:   5\' 7"  (1.702 m)     Intake/Output Summary (Last 24 hours) at 06/28/2018 1007 Last data filed at 06/28/2018 0846 Gross per 24 hour  Intake 960.41 ml  Output 300 ml  Net 660.41 ml   Filed Weights   06/27/18 1856 06/28/18 0252  Weight: 86.2 kg 79.2 kg    Examination:  Constitutional: Appears tachypneic Eyes: PERRL, lids and conjunctivae normal, no scleral icterus ENMT: Mucous membranes are moist.  Neck: normal, supple Respiratory: Faint bibasilar crackles, no wheezing heard.  Increased respiratory effort Cardiovascular: Regular rate and rhythm, no murmurs appreciated.  1+ pitting LE edema. 2+ pedal pulses.  Abdomen: no tenderness. Bowel sounds positive.  Musculoskeletal: no clubbing / cyanosis. Skin: no rashes Neurologic: Nonfocal Psychiatric: Normal judgment and insight. Alert and oriented x 3. Normal mood.    Data Reviewed: I have independently reviewed following labs and imaging studies  Chest x-ray on admission -left lower infiltrate with small pleural effusion EKG-paced rhythm  CBC: Recent Labs  Lab 06/27/18 1910 06/28/18 0409  WBC 2.9* 3.0*  NEUTROABS  --  2.2  HGB 8.9* 8.2*  HCT 28.5* 26.7*  MCV 118.3* 117.6*  PLT 107* 95*   Basic Metabolic Panel: Recent Labs  Lab 06/27/18 1910 06/28/18 0409  NA 139 137  K 4.5 4.2  CL 99 99  CO2 33* 30  GLUCOSE 91 108*  BUN 58* 58*  CREATININE 2.02* 1.84*  CALCIUM 8.6* 8.1*  MG 2.6*  --    GFR: Estimated Creatinine Clearance: 27.9 mL/min (A) (by C-G formula based on SCr of 1.84 mg/dL (H)). Liver Function Tests: No results for input(s): AST, ALT, ALKPHOS, BILITOT, PROT, ALBUMIN in the last 168 hours. No results for input(s): LIPASE, AMYLASE in the last 168 hours. No results for input(s): AMMONIA in the last 168 hours. Coagulation Profile: No results for input(s): INR, PROTIME in the last 168 hours. Cardiac  Enzymes: Recent Labs  Lab 06/27/18 2146 06/28/18 0409 06/28/18 0811  TROPONINI 0.14* 0.10* 0.09*   BNP (last 3 results) No results for input(s): PROBNP in the last 8760 hours. HbA1C: No results for input(s): HGBA1C in the last 72 hours. CBG: No results for input(s): GLUCAP in the last 168 hours. Lipid Profile: No results for input(s): CHOL, HDL, LDLCALC, TRIG, CHOLHDL, LDLDIRECT in the last 72 hours. Thyroid Function Tests: No results for input(s): TSH, T4TOTAL, FREET4, T3FREE, THYROIDAB in the last 72 hours. Anemia Panel: No results for input(s): VITAMINB12, FOLATE, FERRITIN, TIBC, IRON, RETICCTPCT in the last 72 hours. Urine analysis:    Component Value Date/Time   COLORURINE YELLOW 05/05/2018 2247   APPEARANCEUR CLEAR 05/05/2018 2247   LABSPEC 1.008 05/05/2018 2247   PHURINE 5.0 05/05/2018 [place] 05/05/2018 2247   [place] NEGATIVE 05/05/2018 2247   [place] NEGATIVE 05/05/2018 [place] 05/05/2018 2247   PROTEINUR NEGATIVE 05/05/2018 2247   NITRITE NEGATIVE 05/05/2018 2247   LEUKOCYTESUR NEGATIVE 05/05/2018 2247   Sepsis Labs: Invalid input(s): PROCALCITONIN, LACTICIDVEN  Recent  Results (from the past 240 hour(s))  MRSA PCR Screening     Status: None   Collection Time: 06/27/18 11:10 PM  Result Value Ref Range Status   MRSA by PCR NEGATIVE NEGATIVE Final    Comment:        The GeneXpert MRSA Assay (FDA approved for NASAL specimens only), is one component of a comprehensive MRSA colonization surveillance program. It is not intended to diagnose MRSA infection nor to guide or monitor treatment for MRSA infections. Performed at [facility], [place] [address] [place], [place] [address]       Radiology Studies: Dg Chest Portable 1 View  Result Date: 06/27/2018 CLINICAL DATA:  Shortness of breath EXAM: PORTABLE CHEST 1 VIEW COMPARISON:  May 03, 2018 FINDINGS: There is consolidation in the left base with small left  pleural effusion. There is mild right base atelectasis. There is cardiomegaly. Pacemaker leads are attached the right ventricle and coronary sinus. There is aortic atherosclerosis. No adenopathy. There is postoperative change in the right shoulder. IMPRESSION: Consolidation left base with small left pleural effusion. Suspect pneumonia left base. There is mild atelectasis in the right base. Cardiomegaly with pacemaker leads stable. There is aortic atherosclerosis. No evident adenopathy. Aortic Atherosclerosis (ICD10-I70.0). Electronically Signed   By: Lowella Grip III M.D.   On: 06/27/2018 19:35     Marzetta Board, MD, PhD [facility] Pager [phone]  If 7PM-7AM, please contact night-coverage [email or web address] Password [email or web address] 06/28/2018, 10:07 AM

## 2018-06-28 NOTE — Progress Notes (Signed)
Patient could not remember settings on CPAP at home.  Put patient on titrate between minimum of 4 and max of 16.  Bled in 3L O2 because patient stated that he uses oxygen at home.

## 2018-06-28 NOTE — Plan of Care (Signed)
  Problem: Clinical Measurements: Goal: Ability to maintain clinical measurements within normal limits will improve Outcome: Progressing Goal: Respiratory complications will improve Outcome: Progressing   Problem: Activity: Goal: Risk for activity intolerance will decrease Outcome: Progressing   Problem: Coping: Goal: Level of anxiety will decrease Outcome: Progressing

## 2018-06-28 NOTE — Evaluation (Signed)
Physical Therapy Evaluation Patient Details Name: Anthony MAYBERRY Sr. MRN: [id number] DOB: Dec 12, 1931 Today's Date: 06/28/2018   History of Present Illness  Pt is an 84 y.o. male admitted 06/27/18 after progressive weakness and SOB. Worked up for acute on chronic hypoxic respiratory failure, multifactorial due to lobar PNA and CHF. PMH includes CKD III, a-fib, COPD (on 3L home O2), OSA on CPAP, CHF, CAD.    Clinical Impression  Pt presents with an overall decrease in functional mobility secondary to above. PTA, pt indep and is caregiver for wife; children live nearby and assist as well. Today, pt ambulatory with min guard for balance; stability improved with use of RW. SpO2 >95% on 3L O2 [place]. Orthostatic BP values below; pt asymptomatic throughout session. Pt anxious to return home to wife. Pt would benefit from continued acute PT services to maximize functional mobility and independence prior to d/c with HHPT services.   Orthostatic BPs  Supine 100/60  Sitting 94/69  Standing 99/79  Standing after 2 min 108/79  Post-ambulation 110/64      Follow Up Recommendations Home health PT;Supervision - Intermittent    Equipment Recommendations  None recommended by PT    Recommendations for Other Services       Precautions / Restrictions Precautions Precautions: Fall Restrictions Weight Bearing Restrictions: No      Mobility  Bed Mobility               General bed mobility comments: Received sitting in recliner  Transfers Overall transfer level: Modified independent Equipment used: Rolling walker (2 wheeled)             General transfer comment: Stood without DME, but reaching to RW for UE support upon standing  Ambulation/Gait Ambulation/Gait assistance: Supervision;Min guard Gait Distance (Feet): 350 Feet Assistive device: Rolling walker (2 wheeled);None Gait Pattern/deviations: Step-through pattern;Decreased stride length;Trunk flexed Gait velocity:  Decreased Gait velocity interpretation: 1.31 - 2.62 ft/sec, indicative of limited community ambulator General Gait Details: Supervision ambulating with RW, intermittent cues to maintain closer proximity. Min guard ambulating without DME; 1x standing rest break due to SOB (pt also talking the entire walk).  Stairs            Wheelchair Mobility    Modified Rankin (Stroke Patients Only)       Balance Overall balance assessment: Needs assistance   Sitting balance-Leahy Scale: Good       Standing balance-Leahy Scale: Fair                               Pertinent Vitals/Pain Pain Assessment: No/denies pain    Home Living Family/patient expects to be discharged to:: Private residence Living Arrangements: Spouse/significant other Available Help at Discharge: Family;Available PRN/intermittently Type of Home: House Home Access: Ramped entrance     Home Layout: One level Home Equipment: Walker - 2 wheels;Cane - single point;Electric scooter;Crutches;Tub bench;Bedside commode Additional Comments: 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 Children/grandchildren live nearby and assist PRN; children help care for pt's wife as well    Prior Function Level of Independence: Independent with assistive device(s)         Comments: primary care giver for wife. Typically ambulates without any device     Hand Dominance   Dominant Hand: Right    Extremity/Trunk Assessment   Upper Extremity Assessment Upper Extremity Assessment: Overall WFL for tasks assessed    Lower Extremity Assessment Lower Extremity Assessment: Overall WFL for tasks assessed  Cervical / Trunk Assessment Cervical / Trunk Assessment: Kyphotic  Communication   Communication: HOH  Cognition Arousal/Alertness: Awake/alert Behavior During Therapy: WFL for tasks assessed/performed Overall Cognitive Status: Impaired/Different from baseline Area of Impairment: Safety/judgement;Attention                    Current Attention Level: Selective     Safety/Judgement: Decreased awareness of safety;Decreased awareness of deficits     General Comments: Baseline cognition per son      General Comments General comments (skin integrity, edema, etc.): Son present at beginning of session    Exercises     Assessment/Plan    PT Assessment Patient needs continued PT services  PT Problem List Decreased strength;Decreased activity tolerance;Decreased balance;Decreased mobility;Decreased knowledge of use of DME;Cardiopulmonary status limiting activity       PT Treatment Interventions DME instruction;Gait training;Stair training;Functional mobility training;Therapeutic activities;Therapeutic exercise;Balance training;Patient/family education    PT Goals (Current goals can be found in the Care Plan section)  Acute Rehab PT Goals Patient Stated Goal: Return home to wife as soon as possible PT Goal Formulation: With patient Time For Goal Achievement: 07/12/18 Potential to Achieve Goals: Good    Frequency Min 3X/week   Barriers to discharge Decreased caregiver support      Co-evaluation               AM-PAC PT "6 Clicks" Daily Activity  Outcome Measure Difficulty turning over in bed (including adjusting bedclothes, sheets and blankets)?: None Difficulty moving from lying on back to sitting on the side of the bed? : None Difficulty sitting down on and standing up from a chair with arms (e.g., wheelchair, bedside commode, etc,.)?: None Help needed moving to and from a bed to chair (including a wheelchair)?: A Little Help needed walking in hospital room?: A Little Help needed climbing 3-5 steps with a railing? : A Little 6 Click Score: 21    End of Session Equipment Utilized During Treatment: Gait belt;Oxygen Activity Tolerance: Patient tolerated treatment well Patient left: in chair;with call bell/phone within reach;with chair alarm set Nurse Communication: Mobility status PT  Visit Diagnosis: Other abnormalities of gait and mobility (R26.89)    Time: 3403-7096 PT Time Calculation (min) (ACUTE ONLY): 25 min   Charges:   PT Evaluation $PT Eval Moderate Complexity: 1 Mod PT Treatments $Gait Training: 8-22 mins       Mabeline Caras, PT, DPT Acute Rehabilitation Services  Pager [phone] Office [place] 06/28/2018, 5:35 PM

## 2018-06-29 ENCOUNTER — Encounter: Payer: Self-pay | Admitting: Internal Medicine

## 2018-06-29 LAB — CBC
HCT: 30.4 % — ABNORMAL LOW (ref 39.0–52.0)
HEMOGLOBIN: 9.1 g/dL — AB (ref 13.0–17.0)
MCH: 36.1 pg — AB (ref 26.0–34.0)
MCHC: 29.9 g/dL — ABNORMAL LOW (ref 30.0–36.0)
MCV: 120.6 fL — ABNORMAL HIGH (ref 80.0–100.0)
NRBC: 0.7 % — AB (ref 0.0–0.2)
PLATELETS: 90 10*3/uL — AB (ref 150–400)
RBC: 2.52 MIL/uL — AB (ref 4.22–5.81)
RDW: 19.5 % — ABNORMAL HIGH (ref 11.5–15.5)
WBC: 4.2 10*3/uL (ref 4.0–10.5)

## 2018-06-29 LAB — BASIC METABOLIC PANEL
Anion gap: 6 (ref 5–15)
BUN: 60 mg/dL — ABNORMAL HIGH (ref 8–23)
CALCIUM: 8.6 mg/dL — AB (ref 8.9–10.3)
CO2: 33 mmol/L — AB (ref 22–32)
Chloride: 98 mmol/L (ref 98–111)
Creatinine, Ser: 1.9 mg/dL — ABNORMAL HIGH (ref 0.61–1.24)
GFR calc Af Amer: 36 mL/min — ABNORMAL LOW (ref >60)
GFR, EST NON AFRICAN AMERICAN: 31 mL/min — AB (ref >60)
GLUCOSE: 95 mg/dL (ref 70–99)
POTASSIUM: 4.9 mmol/L (ref 3.5–5.1)
SODIUM: 137 mmol/L (ref 135–145)

## 2018-06-29 NOTE — Progress Notes (Addendum)
Physical Therapy Treatment Patient Details Name: Anthony BYROM Sr. MRN: [id number] DOB: 03/15/1932 Today's Date: 06/29/2018    History of Present Illness Pt is an 84 y.o. male admitted 06/27/18 after progressive weakness and SOB. Worked up for acute on chronic hypoxic respiratory failure, multifactorial due to lobar PNA and CHF. PMH includes CKD III, a-fib, COPD (on 3L home O2), OSA on CPAP, CHF, CAD.    PT Comments    Patient progressing well towards PT goals. Attempted using SPC for ambulation today but pt still reaching for BUE support so transitioned back to RW and pt feels better with more support. Requires close Min guard with use of SPC and supervision when using RW. Pt with only 2/4 DOE- not able to obtain Sp02 reading due to poor perfusion. Pt asymptomatic throughout. Encouraged use of RW at home for safety and to decrease falls. Encouraged walking with nursing 1 few more times today to improve endurance and strength. Will follow.    Follow Up Recommendations  Home health PT;Supervision - Intermittent     Equipment Recommendations  None recommended by PT    Recommendations for Other Services       Precautions / Restrictions Precautions Precautions: Fall Restrictions Weight Bearing Restrictions: No    Mobility  Bed Mobility               General bed mobility comments: Received sitting in recliner  Transfers Overall transfer level: Needs assistance Equipment used: None Transfers: Sit to/from Stand Sit to Stand: Supervision         General transfer comment: Stood without DME, but reaching for bed for support upon standing. Supervision for safety.   Ambulation/Gait Ambulation/Gait assistance: Min guard;Supervision Gait Distance (Feet): 300 Feet Assistive device: Rolling walker (2 wheeled);Straight cane Gait Pattern/deviations: Step-through pattern;Decreased stride length;Trunk flexed Gait velocity: 1.7 ft/sec Gait velocity interpretation: 1.31 - 2.62  ft/sec, indicative of limited community ambulator General Gait Details: Initially using SPC and pushing 02 tank but balance not great, progressing to use of RW- balance much improved. 2 standing rest breaks. 2/4 DOE. not able to get 02 reading due to poor perfusion.   Stairs             Wheelchair Mobility    Modified Rankin (Stroke Patients Only)       Balance Overall balance assessment: Needs assistance Sitting-balance support: Feet supported;No upper extremity supported Sitting balance-Leahy Scale: Good     Standing balance support: During functional activity;Single extremity supported Standing balance-Leahy Scale: Fair Standing balance comment: Able to stand and urinate holding onto wall for support.                             Cognition Arousal/Alertness: Awake/alert Behavior During Therapy: WFL for tasks assessed/performed Overall Cognitive Status: Impaired/Different from baseline Area of Impairment: Safety/judgement;Attention                   Current Attention Level: Selective     Safety/Judgement: Decreased awareness of safety;Decreased awareness of deficits     General Comments: Baseline cognition       Exercises      General Comments        Pertinent Vitals/Pain Pain Assessment: No/denies pain    Home Living                      Prior Function            PT  Goals (current goals can now be found in the care plan section) Progress towards PT goals: Progressing toward goals    Frequency    Min 3X/week      PT Plan Current plan remains appropriate    Co-evaluation              AM-PAC PT "6 Clicks" Daily Activity  Outcome Measure  Difficulty turning over in bed (including adjusting bedclothes, sheets and blankets)?: None Difficulty moving from lying on back to sitting on the side of the bed? : None Difficulty sitting down on and standing up from a chair with arms (e.g., wheelchair, bedside  commode, etc,.)?: None Help needed moving to and from a bed to chair (including a wheelchair)?: A Little Help needed walking in hospital room?: A Little Help needed climbing 3-5 steps with a railing? : A Little 6 Click Score: 21    End of Session Equipment Utilized During Treatment: Gait belt;Oxygen Activity Tolerance: Patient tolerated treatment well Patient left: in chair;with call bell/phone within reach;with chair alarm set Nurse Communication: Mobility status PT Visit Diagnosis: Other abnormalities of gait and mobility (R26.89)     Time: 0174-9449 PT Time Calculation (min) (ACUTE ONLY): 24 min  Charges:  $Gait Training: 8-22 mins $Therapeutic Exercise: 8-22 mins                     Wray Kearns, PT, DPT Acute Rehabilitation Services Pager [phone] Office [place] 06/29/2018, 12:42 PM

## 2018-06-29 NOTE — Progress Notes (Signed)
Pt has weight discrepancy from previous day. RN and NT both weighed pt.

## 2018-06-29 NOTE — Progress Notes (Signed)
PROGRESS NOTE  [name] AYT:[id number] DOB: Aug 13, 1931 DOA: 06/27/2018 PCP: [name], MD   LOS: 2 days   Brief Narrative / Interim history: 84 year old male with history of COPD with chronic hypoxic respiratory failure on supplemental oxygen with 3 L at home, chronic systolic CHF with most recent EF 25-30% in June 2019, chronic kidney disease stage III with baseline creatinine around 1.3, pancytopenia due to MDS, admitted to the hospital on 11/19 after several days of progressive weakness and shortness of breath.  He denies any chest pain or abdominal pain.  Denies any fever or chills.  Reports a dry cough.  Family on admission reported that his O2 sats at home was in the 60-70s on home 3 L.  Chest x-ray on admission showed small left pleural effusion with consolidation, suspicious for infiltrate.  He was placed on fact cefepime and was admitted to the hospital with leading diagnosis of pneumonia.  Subjective: -Feels better today, able to work with physical therapy last night and ambulate, felt close to baseline.  Assessment & Plan: Principal Problem:   PNA (pneumonia) Active Problems:   Nonischemic cardiomyopathy - coronaries by angiography 3557   Chronic systolic heart failure (HCC)   Chronic atrial fibrillation   Acute on chronic respiratory failure with hypoxia (HCC)   Acute renal failure superimposed on stage 3 chronic kidney disease (HCC)   Biventricular cardiac pacemaker  st Judes  dual chamber with LV in atrial port    Acute on chronic hypoxic respiratory failure, multifactorial due to lobar pneumonia acute on chronic systolic CHF /nonischemic cardiomyopathy -Patient was placed on vancomycin and cefepime for infiltrate on chest x-ray, cultures remain negative, discontinue vancomycin today and continue cefepime alone.  May be able to be transition to p.o. antibiotics in 24 hours -He is immunosuppressed in the setting of his MDS, continue to closely monitor fever  curve and clinical symptoms -On admission there was some evidence of fluid overload with edema and bibasilar crackles, now resolved.  We will hold Lasix today given relative hypotension and slightly elevated creatinine  Chronic atrial fibrillation -With presence of biventricular pacer currently, not on anticoagulation due to prior GI bleed history -Rhythm is paced on telemetry  Acute kidney injury on chronic kidney disease stage III -Baseline creatinine around 1.3, 2.0 on admission and improved at 1.84 on 10/20.  His Lasix was initially continued for slight fluid overload on admission however I will hold this morning due to blood pressure in the 32K systolic and creatinine slightly higher at 1.90.  He is asymptomatic.  His respiratory status is close to baseline.  Pancytopenia due to myelodysplastic syndrome -Labs overall stable, no need for transfusions, monitor daily, CBC this morning with hemoglobin of 9.1 platelets of 90 which is close to his baseline  BPH -Continue home medications  OSA on CPAP -Continue nightly CPAP  Of note, palliative was consulted during his prior hospital stay remains full code and continue current management   Scheduled Meds: . dutasteride  0.5 mg Oral Daily  . escitalopram  5 mg Oral QHS  . lenalidomide  5 mg Oral QHS  . mirtazapine  15 mg Oral QHS  . pantoprazole  40 mg Oral Daily  . potassium chloride  10 mEq Oral BID  . rOPINIRole  4 mg Oral QHS  . sodium chloride flush  3 mL Intravenous Q12H  . tamsulosin  0.4 mg Oral QPC breakfast   Continuous Infusions: . sodium chloride Stopped (06/29/18 0045)  . ceFEPime (  MAXIPIME) IV Stopped (06/29/18 0045)   PRN Meds:.sodium chloride, albuterol, ipratropium-albuterol, loratadine, sodium chloride flush  DVT prophylaxis: SCDs Code Status: Full code Family Communication: No family present at bedside Disposition Plan: Home in 24 hours based on renal function  Consultants:   None  Procedures:   None     Antimicrobials:  Vancomycin 11/19 >> 11/21  Cefepime 11/19 >>  Objective: Vitals:   06/28/18 2344 06/29/18 0343 06/29/18 0701 06/29/18 0821  BP: 102/60 111/65  (!) 92/54  Pulse: 80 74  79  Resp: 18 20  17   Temp: (!) 97 F (36.1 C) (!) 97 F (36.1 C)  97.7 F (36.5 C)  TempSrc: Oral   Oral  SpO2: 91% 99%  100%  Weight:  90.7 kg 90.4 kg   Height:        Intake/Output Summary (Last 24 hours) at 06/29/2018 1012 Last data filed at 06/29/2018 0300 Gross per 24 hour  Intake 782.32 ml  Output 800 ml  Net -17.68 ml   Filed Weights   06/28/18 0252 06/29/18 0343 06/29/18 0701  Weight: 79.2 kg 90.7 kg 90.4 kg    Examination:  Constitutional: No distress, appears more comfortable this morning Eyes: No scleral icterus appreciated, lids and conjunctivae normal ENMT: Moist mucous membranes Respiratory: No crackles or wheezing heard.  Good air movement Cardiovascular: Regular rate and rhythm, no murmurs appreciated.  Trace lower extremity edema Abdomen: Soft, nontender, nondistended, positive bowel sounds Musculoskeletal: no clubbing / cyanosis. Skin: No rashes seen Neurologic: No focal findings Psychiatric: Normal judgment and insight. Alert and oriented x 3. Normal mood.    Data Reviewed: I have independently reviewed following labs and imaging studies  Chest x-ray on admission -left lower infiltrate with small pleural effusion EKG-paced rhythm  CBC: Recent Labs  Lab 06/27/18 1910 06/28/18 0409 06/29/18 0841  WBC 2.9* 3.0* 4.2  NEUTROABS  --  2.2  --   HGB 8.9* 8.2* 9.1*  HCT 28.5* 26.7* 30.4*  MCV 118.3* 117.6* 120.6*  PLT 107* 95* 90*   Basic Metabolic Panel: Recent Labs  Lab 06/27/18 1910 06/28/18 0409 06/29/18 0516  NA 139 137 137  K 4.5 4.2 4.9  CL 99 99 98  CO2 33* 30 33*  GLUCOSE 91 108* 95  BUN 58* 58* 60*  CREATININE 2.02* 1.84* 1.90*  CALCIUM 8.6* 8.1* 8.6*  MG 2.6*  --   --    GFR: Estimated Creatinine Clearance: 31 mL/min (A) (by C-G  formula based on SCr of 1.9 mg/dL (H)). Liver Function Tests: No results for input(s): AST, ALT, ALKPHOS, BILITOT, PROT, ALBUMIN in the last 168 hours. No results for input(s): LIPASE, AMYLASE in the last 168 hours. No results for input(s): AMMONIA in the last 168 hours. Coagulation Profile: No results for input(s): INR, PROTIME in the last 168 hours. Cardiac Enzymes: Recent Labs  Lab 06/27/18 2146 06/28/18 0409 06/28/18 0811  TROPONINI 0.14* 0.10* 0.09*   BNP (last 3 results) No results for input(s): PROBNP in the last 8760 hours. HbA1C: No results for input(s): HGBA1C in the last 72 hours. CBG: No results for input(s): GLUCAP in the last 168 hours. Lipid Profile: No results for input(s): CHOL, HDL, LDLCALC, TRIG, CHOLHDL, LDLDIRECT in the last 72 hours. Thyroid Function Tests: No results for input(s): TSH, T4TOTAL, FREET4, T3FREE, THYROIDAB in the last 72 hours. Anemia Panel: No results for input(s): VITAMINB12, FOLATE, FERRITIN, TIBC, IRON, RETICCTPCT in the last 72 hours. Urine analysis:    Component Value Date/Time  COLORURINE YELLOW 05/05/2018 2247   APPEARANCEUR CLEAR 05/05/2018 2247   LABSPEC 1.008 05/05/2018 2247   PHURINE 5.0 05/05/2018 [place] 05/05/2018 2247   [place] 05/05/2018 [place] NEGATIVE 05/05/2018 [place] 05/05/2018 2247   PROTEINUR NEGATIVE 05/05/2018 2247   NITRITE NEGATIVE 05/05/2018 2247   LEUKOCYTESUR NEGATIVE 05/05/2018 2247   Sepsis Labs: Invalid input(s): PROCALCITONIN, LACTICIDVEN  Recent Results (from the past 240 hour(s))  Culture, blood (routine x 2) Call MD if unable to obtain prior to antibiotics being given     Status: None (Preliminary result)   Collection Time: 06/27/18  9:50 PM  Result Value Ref Range Status   Specimen Description BLOOD RIGHT HAND  Final   Special Requests   Final    BOTTLES DRAWN AEROBIC ONLY Blood Culture adequate volume   Culture   Final    NO GROWTH <  24 HOURS Performed at [facility], [address], [place], [place] [address]    Report Status PENDING  Incomplete  Culture, blood (routine x 2) Call MD if unable to obtain prior to antibiotics being given     Status: None (Preliminary result)   Collection Time: 06/27/18  9:55 PM  Result Value Ref Range Status   Specimen Description BLOOD RIGHT HAND  Final   Special Requests   Final    BOTTLES DRAWN AEROBIC ONLY Blood Culture adequate volume   Culture   Final    NO GROWTH < 24 HOURS Performed at [facility], [address] [place], [place] [address]    Report Status PENDING  Incomplete  MRSA PCR Screening     Status: None   Collection Time: 06/27/18 11:10 PM  Result Value Ref Range Status   MRSA by PCR NEGATIVE NEGATIVE Final    Comment:        The GeneXpert MRSA Assay (FDA approved for NASAL specimens only), is one component of a comprehensive MRSA colonization surveillance program. It is not intended to diagnose MRSA infection nor to guide or monitor treatment for MRSA infections. Performed at [facility], [place] [address], [place], [place] [address]       Radiology Studies: Dg Chest Portable 1 View  Result Date: 06/27/2018 CLINICAL DATA:  Shortness of breath EXAM: PORTABLE CHEST 1 VIEW COMPARISON:  May 03, 2018 FINDINGS: There is consolidation in the left base with small left pleural effusion. There is mild right base atelectasis. There is cardiomegaly. Pacemaker leads are attached the right ventricle and coronary sinus. There is aortic atherosclerosis. No adenopathy. There is postoperative change in the right shoulder. IMPRESSION: Consolidation left base with small left pleural effusion. Suspect pneumonia left base. There is mild atelectasis in the right base. Cardiomegaly with pacemaker leads stable. There is aortic atherosclerosis. No evident adenopathy. Aortic Atherosclerosis (ICD10-I70.0). Electronically Signed   By: Lowella Grip III M.D.    On: 06/27/2018 19:35     Marzetta Board, MD, PhD [facility] Pager [phone]  If 7PM-7AM, please contact night-coverage [email or web address] Password [email or web address] 06/29/2018, 10:12 AM

## 2018-06-30 LAB — CBC
HEMATOCRIT: 28 % — AB (ref 39.0–52.0)
Hemoglobin: 8.4 g/dL — ABNORMAL LOW (ref 13.0–17.0)
MCH: 36.5 pg — ABNORMAL HIGH (ref 26.0–34.0)
MCHC: 30 g/dL (ref 30.0–36.0)
MCV: 121.7 fL — ABNORMAL HIGH (ref 80.0–100.0)
NRBC: 0.7 % — AB (ref 0.0–0.2)
PLATELETS: 92 10*3/uL — AB (ref 150–400)
RBC: 2.3 MIL/uL — ABNORMAL LOW (ref 4.22–5.81)
RDW: 19.5 % — AB (ref 11.5–15.5)
WBC: 4.1 10*3/uL (ref 4.0–10.5)

## 2018-06-30 LAB — BASIC METABOLIC PANEL
ANION GAP: 4 — AB (ref 5–15)
BUN: 68 mg/dL — AB (ref 8–23)
CHLORIDE: 98 mmol/L (ref 98–111)
CO2: 33 mmol/L — ABNORMAL HIGH (ref 22–32)
Calcium: 8.5 mg/dL — ABNORMAL LOW (ref 8.9–10.3)
Creatinine, Ser: 1.91 mg/dL — ABNORMAL HIGH (ref 0.61–1.24)
GFR calc Af Amer: 35 mL/min — ABNORMAL LOW (ref >60)
GFR, EST NON AFRICAN AMERICAN: 31 mL/min — AB (ref >60)
Glucose, Bld: 103 mg/dL — ABNORMAL HIGH (ref 70–99)
Potassium: 5 mmol/L (ref 3.5–5.1)
SODIUM: 135 mmol/L (ref 135–145)

## 2018-06-30 MED ORDER — HEPARIN SODIUM (PORCINE) 5000 UNIT/ML IJ SOLN
5000.0000 [IU] | Freq: Three times a day (TID) | INTRAMUSCULAR | Status: DC
  Administered 2018-06-30 – 2018-07-04 (×13): 5000 [IU] via SUBCUTANEOUS
  Filled 2018-06-30 (×11): qty 1

## 2018-06-30 NOTE — Progress Notes (Signed)
Physical Therapy Treatment Patient Details Name: Anthony VALLETTA Sr. MRN: [id number] DOB: [birth year] Today's Date: 06/30/2018    History of Present Illness Pt is an 84 y.o. male admitted 06/27/18 after progressive weakness and SOB. Worked up for acute on chronic hypoxic respiratory failure, multifactorial due to lobar PNA and CHF. PMH includes CKD III, a-fib, COPD (on 3L home O2), OSA on CPAP, CHF, CAD.   PT Comments    Pt progressing well with mobility. Mod indep with transfers using RW. Ambulatory with RW at supervision-level, requiring intermittent cues for best technique. Pt limited by c/o lower back pain this session. SpO2 95% on RA; pt performed seated rest and deep breathing, able to watch pulse ox increase to 100%. Educ on energy conservation and fall risk reduction; strongly encourage use of RW at home.    Follow Up Recommendations  Home health PT;Supervision - Intermittent     Equipment Recommendations  None recommended by PT    Recommendations for Other Services       Precautions / Restrictions Precautions Precautions: Fall Restrictions Weight Bearing Restrictions: No    Mobility  Bed Mobility               General bed mobility comments: Received sitting in recliner  Transfers Overall transfer level: Modified independent Equipment used: Rolling walker (2 wheeled) Transfers: Sit to/from Stand              Ambulation/Gait Ambulation/Gait assistance: Supervision Gait Distance (Feet): 250 Feet Assistive device: Rolling walker (2 wheeled) Gait Pattern/deviations: Step-through pattern;Decreased stride length;Trunk flexed   Gait velocity interpretation: 1.31 - 2.62 ft/sec, indicative of limited community ambulator General Gait Details: Slow, steady amb with RW; 2x cues to maintain closer proximity to RW and for upright posture. 4x standing rest breaks leaning on hallway rail due to low back pain   Stairs             Wheelchair Mobility     Modified Rankin (Stroke Patients Only)       Balance Overall balance assessment: Needs assistance Sitting-balance support: Feet supported;No upper extremity supported Sitting balance-Leahy Scale: Good     Standing balance support: During functional activity;Single extremity supported Standing balance-Leahy Scale: Fair Standing balance comment: Able to static stand without UE support, but looking to furniture for at least single UE support                            Cognition Arousal/Alertness: Awake/alert Behavior During Therapy: WFL for tasks assessed/performed Overall Cognitive Status: History of cognitive impairments - at baseline Area of Impairment: Safety/judgement;Attention                   Current Attention Level: Selective     Safety/Judgement: Decreased awareness of safety;Decreased awareness of deficits     General Comments: Baseline cognition       Exercises      General Comments General comments (skin integrity, edema, etc.): SpO2 95% on 3L O2 [place]; pt able to sit and perform deep breathing watching SpO2 increase to 100%      Pertinent Vitals/Pain Pain Assessment: Faces Faces Pain Scale: Hurts even more Pain Location: Back Pain Descriptors / Indicators: Discomfort;Guarding;Grimacing Pain Intervention(s): Limited activity within patient's tolerance;Repositioned    Home Living                      Prior Function  PT Goals (current goals can now be found in the care plan section) Acute Rehab PT Goals Patient Stated Goal: Return home to wife as soon as possible PT Goal Formulation: With patient Time For Goal Achievement: 07/12/18 Potential to Achieve Goals: Good Progress towards PT goals: Progressing toward goals    Frequency    Min 3X/week      PT Plan Current plan remains appropriate    Co-evaluation              AM-PAC PT "6 Clicks" Daily Activity  Outcome Measure  Difficulty turning  over in bed (including adjusting bedclothes, sheets and blankets)?: None Difficulty moving from lying on back to sitting on the side of the bed? : None Difficulty sitting down on and standing up from a chair with arms (e.g., wheelchair, bedside commode, etc,.)?: None Help needed moving to and from a bed to chair (including a wheelchair)?: A Little Help needed walking in hospital room?: A Little Help needed climbing 3-5 steps with a railing? : A Little 6 Click Score: 21    End of Session Equipment Utilized During Treatment: Gait belt;Oxygen Activity Tolerance: Patient tolerated treatment well Patient left: in chair;with call bell/phone within reach;with chair alarm set Nurse Communication: Mobility status PT Visit Diagnosis: Other abnormalities of gait and mobility (R26.89)     Time: 6962-9528 PT Time Calculation (min) (ACUTE ONLY): 28 min  Charges:  $Gait Training: 8-22 mins $Self Care/Home Management: 8-22                    Mabeline Caras, PT, DPT Acute Rehabilitation Services  Pager [phone] Office [place] 06/30/2018, 10:21 AM

## 2018-06-30 NOTE — Progress Notes (Signed)
PROGRESS NOTE  Coolidge D Berkey Sr. MRN:[id number] DOB: 01/21/1932 DOA: 06/27/2018 PCP: [name], Richard W, MD   LOS: 3 days   Brief Narrative / Interim history: 84-year-old male with history of COPD with chronic hypoxic respiratory failure on supplemental oxygen with 3 L at home, chronic systolic CHF with most recent EF 25-30% in June 2019, chronic kidney disease stage III with baseline creatinine around 1.3, pancytopenia due to MDS, admitted to the hospital on 11/19 after several days of progressive weakness and shortness of breath.  He denies any chest pain or abdominal pain.  Denies any fever or chills.  Reports a dry cough.  Family on admission reported that his O2 sats at home was in the 60-70s on home 3 L.  Chest x-ray on admission showed small left pleural effusion with consolidation, suspicious for infiltrate.  He was placed on fact cefepime and was admitted to the hospital with leading diagnosis of pneumonia.  Subjective: Patient reports feeling better. Met patient eating breakfast, denies any worsening shortness of breath, chest pain, abdominal pain, fever/chills.  Assessment & Plan: Principal Problem:   PNA (pneumonia) Active Problems:   Nonischemic cardiomyopathy - coronaries by angiography 2010   Chronic systolic heart failure (HCC)   Chronic atrial fibrillation   Acute on chronic respiratory failure with hypoxia (HCC)   Acute renal failure superimposed on stage 3 chronic kidney disease (HCC)   Biventricular cardiac pacemaker  st Judes  dual chamber with LV in atrial port    Acute on chronic hypoxic respiratory failure, multifactorial due to lobar pneumonia acute on chronic systolic CHF /nonischemic cardiomyopathy Improving BC x2 negative Chest x-ray showed consolidation in left base Continue cefepime Echo on 01/2018 showed EF of 25 to 30%, LV diastolic function was not determined On admission there was some evidence of fluid overload with edema and bibasilar crackles, now  resolved, continue to hold Lasix today given relative hypotension and slightly elevated creatinine  Chronic atrial fibrillation -With presence of biventricular pacer currently, not on anticoagulation due to prior GI bleed history -Rhythm is paced on telemetry  Acute kidney injury on chronic kidney disease stage III -Baseline creatinine around 1.3-1.6, 2.0 on admission and improved at 1.84, now 1.91 -Continue to hold Lasix -BMP in a.m.  Pancytopenia due to myelodysplastic syndrome -Labs overall stable, no need for transfusions Continue lenalidomide Daily CBC  BPH -Continue home medications  OSA on CPAP -Continue nightly CPAP  [facility] Palliative was consulted during his prior hospital stay remains full code and continue current management   Scheduled Meds: . dutasteride  0.5 mg Oral Daily  . escitalopram  5 mg Oral QHS  . lenalidomide  5 mg Oral QHS  . mirtazapine  15 mg Oral QHS  . pantoprazole  40 mg Oral Daily  . rOPINIRole  4 mg Oral QHS  . sodium chloride flush  3 mL Intravenous Q12H  . tamsulosin  0.4 mg Oral QPC breakfast   Continuous Infusions: . sodium chloride Stopped (06/29/18 0045)  . ceFEPime (MAXIPIME) IV 1 g (06/30/18 0251)   PRN Meds:.sodium chloride, albuterol, ipratropium-albuterol, loratadine, sodium chloride flush  DVT prophylaxis: Heparin Code Status: Full code Family Communication: No family present at bedside Disposition Plan: Home in 24 hours based on renal function  Consultants:   None  Procedures:   None   Antimicrobials:  Vancomycin 11/19 >> 11/21  Cefepime 11/19 >>  Objective: Vitals:   06/30/18 0408 06/30/18 0700 06/30/18 0704 06/30/18 1116  BP: (!) 92/54   (!) 100/47    Pulse: 70   78  Resp: 18   20  Temp: (!) 97.3 F (36.3 C)   (!) 97.5 F (36.4 C)  TempSrc: Oral   Oral  SpO2: 94%  100% 98%  Weight:  91.9 kg    Height:        Intake/Output Summary (Last 24 hours) at 06/30/2018 1716 Last data filed at 06/30/2018  1345 Gross per 24 hour  Intake 580 ml  Output 200 ml  Net 380 ml   Filed Weights   06/29/18 0343 06/29/18 0701 06/30/18 0700  Weight: 90.7 kg 90.4 kg 91.9 kg    Examination:  General: NAD   Cardiovascular: S1, S2 present  Respiratory: Diminished BS b/l  Abdomen: Soft, nontender, nondistended, bowel sounds present  Musculoskeletal: No bilateral pedal edema noted  Skin: Normal  Psychiatry: Normal mood   Data Reviewed: I have independently reviewed following labs and imaging studies    CBC: Recent Labs  Lab 06/27/18 1910 06/28/18 0409 06/29/18 0841 06/30/18 0502  WBC 2.9* 3.0* 4.2 4.1  NEUTROABS  --  2.2  --   --   HGB 8.9* 8.2* 9.1* 8.4*  HCT 28.5* 26.7* 30.4* 28.0*  MCV 118.3* 117.6* 120.6* 121.7*  PLT 107* 95* 90* 92*   Basic Metabolic Panel: Recent Labs  Lab 06/27/18 1910 06/28/18 0409 06/29/18 0516 06/30/18 0502  NA 139 137 137 135  K 4.5 4.2 4.9 5.0  CL 99 99 98 98  CO2 33* 30 33* 33*  GLUCOSE 91 108* 95 103*  BUN 58* 58* 60* 68*  CREATININE 2.02* 1.84* 1.90* 1.91*  CALCIUM 8.6* 8.1* 8.6* 8.5*  MG 2.6*  --   --   --    GFR: Estimated Creatinine Clearance: 31.1 mL/min (A) (by C-G formula based on SCr of 1.91 mg/dL (H)). Liver Function Tests: No results for input(s): AST, ALT, ALKPHOS, BILITOT, PROT, ALBUMIN in the last 168 hours. No results for input(s): LIPASE, AMYLASE in the last 168 hours. No results for input(s): AMMONIA in the last 168 hours. Coagulation Profile: No results for input(s): INR, PROTIME in the last 168 hours. Cardiac Enzymes: Recent Labs  Lab 06/27/18 2146 06/28/18 0409 06/28/18 0811  TROPONINI 0.14* 0.10* 0.09*   BNP (last 3 results) No results for input(s): PROBNP in the last 8760 hours. HbA1C: No results for input(s): HGBA1C in the last 72 hours. CBG: No results for input(s): GLUCAP in the last 168 hours. Lipid Profile: No results for input(s): CHOL, HDL, LDLCALC, TRIG, CHOLHDL, LDLDIRECT in the last 72  hours. Thyroid Function Tests: No results for input(s): TSH, T4TOTAL, FREET4, T3FREE, THYROIDAB in the last 72 hours. Anemia Panel: No results for input(s): VITAMINB12, FOLATE, FERRITIN, TIBC, IRON, RETICCTPCT in the last 72 hours. Urine analysis:    Component Value Date/Time   COLORURINE YELLOW 05/05/2018 2247   APPEARANCEUR CLEAR 05/05/2018 2247   LABSPEC 1.008 05/05/2018 2247   PHURINE 5.0 05/05/2018 2247   GLUCOSEU NEGATIVE 05/05/2018 2247   HGBUR NEGATIVE 05/05/2018 2247   BILIRUBINUR NEGATIVE 05/05/2018 2247   KETONESUR NEGATIVE 05/05/2018 2247   PROTEINUR NEGATIVE 05/05/2018 2247   NITRITE NEGATIVE 05/05/2018 2247   LEUKOCYTESUR NEGATIVE 05/05/2018 2247   Sepsis Labs: Invalid input(s): PROCALCITONIN, LACTICIDVEN  Recent Results (from the past 240 hour(s))  Culture, blood (routine x 2) Call MD if unable to obtain prior to antibiotics being given     Status: None (Preliminary result)   Collection Time: 06/27/18  9:50 PM  Result Value Ref Range Status     Specimen Description BLOOD RIGHT HAND  Final   Special Requests   Final    BOTTLES DRAWN AEROBIC ONLY Blood Culture adequate volume   Culture   Final    NO GROWTH 3 DAYS Performed at [facility], [address] [place], [place] [address]    Report Status PENDING  Incomplete  Culture, blood (routine x 2) Call MD if unable to obtain prior to antibiotics being given     Status: None (Preliminary result)   Collection Time: 06/27/18  9:55 PM  Result Value Ref Range Status   Specimen Description BLOOD RIGHT HAND  Final   Special Requests   Final    BOTTLES DRAWN AEROBIC ONLY Blood Culture adequate volume   Culture   Final    NO GROWTH 3 DAYS Performed at [facility], [address] [place], [place] [address]    Report Status PENDING  Incomplete  MRSA PCR Screening     Status: None   Collection Time: 06/27/18 11:10 PM  Result Value Ref Range Status   MRSA by PCR NEGATIVE NEGATIVE Final    Comment:         The GeneXpert MRSA Assay (FDA approved for NASAL specimens only), is one component of a comprehensive MRSA colonization surveillance program. It is not intended to diagnose MRSA infection nor to guide or monitor treatment for MRSA infections. Performed at  [facility], [address] [place], [place] [address]       Radiology Studies: No results found.   Nkeiruka J Ezenduka, MD [facility]  If 7PM-7AM, please contact night-coverage [email or web address]  06/30/2018, 5:16 PM    

## 2018-06-30 NOTE — Care Management Important Message (Signed)
Important Message  Patient Details  Name: Anthony JOHANSSON Sr. MRN: [id number] Date of Birth: [birth year]   Medicare Important Message Given:  Yes    [name] 06/30/2018, 2:48 PM

## 2018-07-01 ENCOUNTER — Inpatient Hospital Stay (HOSPITAL_COMMUNITY): Payer: Medicare Other

## 2018-07-01 ENCOUNTER — Other Ambulatory Visit: Payer: Self-pay

## 2018-07-01 DIAGNOSIS — J181 Lobar pneumonia, unspecified organism: Principal | ICD-10-CM

## 2018-07-01 DIAGNOSIS — I5023 Acute on chronic systolic (congestive) heart failure: Secondary | ICD-10-CM

## 2018-07-01 DIAGNOSIS — N183 Chronic kidney disease, stage 3 (moderate): Secondary | ICD-10-CM

## 2018-07-01 DIAGNOSIS — E875 Hyperkalemia: Secondary | ICD-10-CM

## 2018-07-01 DIAGNOSIS — I5022 Chronic systolic (congestive) heart failure: Secondary | ICD-10-CM

## 2018-07-01 DIAGNOSIS — I482 Chronic atrial fibrillation, unspecified: Secondary | ICD-10-CM

## 2018-07-01 DIAGNOSIS — J9621 Acute and chronic respiratory failure with hypoxia: Secondary | ICD-10-CM

## 2018-07-01 DIAGNOSIS — N179 Acute kidney failure, unspecified: Secondary | ICD-10-CM

## 2018-07-01 DIAGNOSIS — I428 Other cardiomyopathies: Secondary | ICD-10-CM

## 2018-07-01 DIAGNOSIS — Z950 Presence of cardiac pacemaker: Secondary | ICD-10-CM

## 2018-07-01 LAB — BLOOD GAS, ARTERIAL
Acid-Base Excess: 4.3 mmol/L — ABNORMAL HIGH (ref 0.0–2.0)
Bicarbonate: 31.1 mmol/L — ABNORMAL HIGH (ref 20.0–28.0)
Drawn by: 23703
O2 CONTENT: 3 L/min
O2 SAT: 86.2 %
PATIENT TEMPERATURE: 98.6
pCO2 arterial: 74 mmHg (ref 32.0–48.0)
pH, Arterial: 7.247 — ABNORMAL LOW (ref 7.350–7.450)
pO2, Arterial: 60.1 mmHg — ABNORMAL LOW (ref 83.0–108.0)

## 2018-07-01 LAB — CBC WITH DIFFERENTIAL/PLATELET
Abs Immature Granulocytes: 0.02 10*3/uL (ref 0.00–0.07)
Basophils Absolute: 0 10*3/uL (ref 0.0–0.1)
Basophils Relative: 1 %
EOS ABS: 0.1 10*3/uL (ref 0.0–0.5)
EOS PCT: 2 %
HEMATOCRIT: 30.1 % — AB (ref 39.0–52.0)
HEMOGLOBIN: 8.9 g/dL — AB (ref 13.0–17.0)
Immature Granulocytes: 1 %
LYMPHS ABS: 0.5 10*3/uL — AB (ref 0.7–4.0)
Lymphocytes Relative: 13 %
MCH: 35.5 pg — AB (ref 26.0–34.0)
MCHC: 29.6 g/dL — AB (ref 30.0–36.0)
MCV: 119.9 fL — AB (ref 80.0–100.0)
MONO ABS: 0.4 10*3/uL (ref 0.1–1.0)
MONOS PCT: 10 %
Neutro Abs: 2.9 10*3/uL (ref 1.7–7.7)
Neutrophils Relative %: 73 %
Platelets: 85 10*3/uL — ABNORMAL LOW (ref 150–400)
RBC: 2.51 MIL/uL — ABNORMAL LOW (ref 4.22–5.81)
RDW: 19.4 % — ABNORMAL HIGH (ref 11.5–15.5)
WBC: 3.9 10*3/uL — ABNORMAL LOW (ref 4.0–10.5)
nRBC: 0.5 % — ABNORMAL HIGH (ref 0.0–0.2)

## 2018-07-01 LAB — POCT I-STAT 3, ART BLOOD GAS (G3+)
Acid-Base Excess: 5 mmol/L — ABNORMAL HIGH (ref 0.0–2.0)
Bicarbonate: 33.4 mmol/L — ABNORMAL HIGH (ref 20.0–28.0)
O2 SAT: 98 %
PCO2 ART: 72.6 mmHg — AB (ref 32.0–48.0)
Patient temperature: 97.4
TCO2: 36 mmol/L — ABNORMAL HIGH (ref 22–32)
pH, Arterial: 7.267 — ABNORMAL LOW (ref 7.350–7.450)
pO2, Arterial: 119 mmHg — ABNORMAL HIGH (ref 83.0–108.0)

## 2018-07-01 LAB — BASIC METABOLIC PANEL
Anion gap: 6 (ref 5–15)
Anion gap: 7 (ref 5–15)
BUN: 81 mg/dL — ABNORMAL HIGH (ref 8–23)
BUN: 78 mg/dL — AB (ref 8–23)
CHLORIDE: 95 mmol/L — AB (ref 98–111)
CHLORIDE: 97 mmol/L — AB (ref 98–111)
CO2: 31 mmol/L (ref 22–32)
CO2: 29 mmol/L (ref 22–32)
CREATININE: 2.18 mg/dL — AB (ref 0.61–1.24)
Calcium: 8.6 mg/dL — ABNORMAL LOW (ref 8.9–10.3)
Calcium: 8.1 mg/dL — ABNORMAL LOW (ref 8.9–10.3)
Creatinine, Ser: 1.92 mg/dL — ABNORMAL HIGH (ref 0.61–1.24)
GFR calc Af Amer: 30 mL/min — ABNORMAL LOW (ref >60)
GFR calc Af Amer: 35 mL/min — ABNORMAL LOW (ref >60)
GFR calc non Af Amer: 26 mL/min — ABNORMAL LOW (ref >60)
GFR calc non Af Amer: 30 mL/min — ABNORMAL LOW (ref >60)
GLUCOSE: 76 mg/dL (ref 70–99)
Glucose, Bld: 95 mg/dL (ref 70–99)
POTASSIUM: 5.2 mmol/L — AB (ref 3.5–5.1)
Potassium: 6 mmol/L — ABNORMAL HIGH (ref 3.5–5.1)
SODIUM: 133 mmol/L — AB (ref 135–145)
Sodium: 132 mmol/L — ABNORMAL LOW (ref 135–145)

## 2018-07-01 LAB — GLUCOSE, CAPILLARY: Glucose-Capillary: 63 mg/dL — ABNORMAL LOW (ref 70–99)

## 2018-07-01 LAB — STREP PNEUMONIAE URINARY ANTIGEN: Strep Pneumo Urinary Antigen: NEGATIVE

## 2018-07-01 MED ORDER — SODIUM POLYSTYRENE SULFONATE 15 GM/60ML PO SUSP: 30.0000 g | Freq: Once | ORAL | Status: DC

## 2018-07-01 MED ORDER — FUROSEMIDE 10 MG/ML IJ SOLN
80.0000 mg | Freq: Once | INTRAMUSCULAR | Status: AC
  Administered 2018-07-01: 80 mg via INTRAVENOUS
  Filled 2018-07-01: qty 8

## 2018-07-01 MED ORDER — FUROSEMIDE 10 MG/ML IJ SOLN
20.0000 mg | Freq: Once | INTRAMUSCULAR | Status: AC
  Administered 2018-07-01: 20 mg via INTRAVENOUS
  Filled 2018-07-01: qty 2

## 2018-07-01 MED ORDER — ALUM & MAG HYDROXIDE-SIMETH 200-200-20 MG/5ML PO SUSP
15.0000 mL | Freq: Four times a day (QID) | ORAL | Status: DC | PRN
  Administered 2018-07-01: 15 mL via ORAL
  Filled 2018-07-01: qty 30

## 2018-07-01 MED ORDER — MILRINONE LACTATE IN DEXTROSE 20-5 MG/100ML-% IV SOLN
0.1250 ug/kg/min | INTRAVENOUS | Status: DC
  Administered 2018-07-01 – 2018-07-02 (×4): 0.25 ug/kg/min via INTRAVENOUS
  Administered 2018-07-03: 0.246 ug/kg/min via INTRAVENOUS
  Administered 2018-07-04 – 2018-07-05 (×3): 0.25 ug/kg/min via INTRAVENOUS
  Filled 2018-07-01 (×7): qty 100

## 2018-07-01 NOTE — Progress Notes (Signed)
Paged Dr. Radford Pax with Cardiology to notify of critical ABG results.  Dr. Radford Pax to consult CCM at this time.  Pt currently calm and resting on Bipap.  Family updated at bedside.  Will continue to monitor patient.

## 2018-07-01 NOTE — Procedures (Signed)
Central Venous Catheter Insertion Procedure Note [name] [id number] [birth year]  Procedure: Insertion of Central Venous Catheter Indications: Assessment of intravascular volume, Drug and/or fluid administration and Frequent blood sampling  Procedure Details Consent: Risks of procedure as well as the alternatives and risks of each were explained to the (patient/caregiver).  Consent for procedure obtained. Time Out: Verified patient identification, verified procedure, site/side was marked, verified correct patient position, special equipment/implants available, medications/allergies/relevent history reviewed, required imaging and test results available.  Performed  Maximum sterile technique was used including antiseptics, cap, gloves, gown, hand hygiene, mask and sheet. Skin prep: Chlorhexidine; local anesthetic administered A antimicrobial bonded/coated triple lumen catheter was placed in the right internal jugular vein using the Seldinger technique. Catheter placed to 18 cm. Blood aspirated via all 3 ports and then flushed x 3. Line sutured x 4 and dressing applied.  Ultrasound guidance used.Yes.    Evaluation Blood flow good Complications: Complications of hematoma at the site. patient became agitated mid-procedure.  Patient did tolerate procedure well. Chest X-ray ordered to verify placement.  CXR: pending.  Georgann Housekeeper, AGACNP-BC [place] Pager [phone] or [phone]  07/01/2018 10:03 PM

## 2018-07-01 NOTE — Progress Notes (Addendum)
PROGRESS NOTE  [name] IOE:[id number] DOB: [birth year] DOA: 06/27/2018 PCP: [name], MD   LOS: 4 days   Brief Narrative / Interim history: 84 year old male with history of COPD with chronic hypoxic respiratory failure on supplemental oxygen with 3 L at home, chronic systolic CHF with most recent EF 25-30% in June 2019, chronic kidney disease stage III with baseline creatinine around 1.3, pancytopenia due to MDS, admitted to the hospital on 11/19 after several days of progressive weakness and shortness of breath.  He denies any chest pain or abdominal pain.  Denies any fever or chills.  Reports a dry cough.  Family on admission reported that his O2 sats at home was in the 60-70s on home 3 L.  Chest x-ray on admission showed small left pleural effusion with consolidation, suspicious for infiltrate.  He was placed on fact cefepime and was admitted to the hospital with leading diagnosis of pneumonia.  Subjective: Overnight, patient had an episode of severe dyspnea, was given IV Lasix, with slight improvement.  Today met patient eating breakfast with wife at bedside, noted to be more dyspneic than yesterday, and somewhat lethargic.  Patient denied any chest pain, noted worsening shortness of breath, denies any abdominal pain, nausea/vomiting.  Cardiology consulted.  Assessment & Plan: Principal Problem:   PNA (pneumonia) Active Problems:   Nonischemic cardiomyopathy - coronaries by angiography 1829   Chronic systolic heart failure (HCC)   Chronic atrial fibrillation   Acute on chronic respiratory failure with hypoxia (HCC)   Acute renal failure superimposed on stage 3 chronic kidney disease (HCC)   Biventricular cardiac pacemaker  st Judes  dual chamber with LV in atrial port    Acute on chronic hypoxic respiratory failure, multifactorial due to lobar pneumonia Vs acute on chronic systolic CHF /nonischemic cardiomyopathy Vs decompensated respiratory acidosis Vs pulmonary  hypertension Worsened overnight, fluid overloaded BC x2 negative Chest x-ray showed consolidation in left base Continue cefepime Echo on 01/2018 showed EF of 25 to 93%, LV diastolic function was not determined On admission there was some evidence of fluid overload with edema and bibasilar crackles with subsequent administration of Lasix. Lasix was held due to relative hypotension and slightly elevated creatinine Consulted cardiology, appreciate recommendations: Restart Lasix, milrinone.  Start BiPAP.  Transferred to ICU for closer observation  Hyperkalemia K noted to be 6 this am Potassium supplement d/c on 06/30/18 Give Kayexalate once Repeat BMP pending to assess trend  Chronic atrial fibrillation -With presence of biventricular pacer currently, not on anticoagulation due to prior GI bleed history -Rhythm is paced  Acute kidney injury on chronic kidney disease stage III Worsening -Baseline creatinine around 1.3-1.6, 2.0 on admission and improved at 1.84, now 1.91 -Lasix restarted as per cardiology -BMP in a.m.  Pancytopenia due to myelodysplastic syndrome -Labs overall stable, no need for transfusions Continue lenalidomide Daily CBC  BPH -Continue home medications  OSA on CPAP -Continue nightly CPAP  GOC Palliative was consulted during his prior hospital stay remains full code and continue current management   Scheduled Meds: . dutasteride  0.5 mg Oral Daily  . escitalopram  5 mg Oral QHS  . heparin injection (subcutaneous)  5,000 Units Subcutaneous Q8H  . lenalidomide  5 mg Oral QHS  . mirtazapine  15 mg Oral QHS  . pantoprazole  40 mg Oral Daily  . rOPINIRole  4 mg Oral QHS  . sodium chloride flush  3 mL Intravenous Q12H  . sodium polystyrene  30 g Oral Once  .  tamsulosin  0.4 mg Oral QPC breakfast   Continuous Infusions: . sodium chloride Stopped (07/01/18 1517)  . ceFEPime (MAXIPIME) IV Stopped (07/01/18 0251)  . milrinone 0.249 mcg/kg/min (07/01/18 1517)     PRN Meds:.sodium chloride, albuterol, alum & mag hydroxide-simeth, ipratropium-albuterol, loratadine, sodium chloride flush  DVT prophylaxis: Heparin Code Status: Full code Family Communication: Wife present at bedside Disposition Plan: To be determined  Consultants:   Cardiology  Procedures:   None   Antimicrobials:  Vancomycin July 21, 2023 >> 11/21  Cefepime 2023/07/21 >>  Objective: Vitals:   07/01/18 1700 07/01/18 1712 07/01/18 1715 07/01/18 1730  BP: (!) 94/59  (!) 90/57 (!) 92/56  Pulse: 70 70 70 70  Resp: '17 11 14 14  ' Temp:      TempSrc:      SpO2: 100% 100% 98% 100%  Weight:      Height:        Intake/Output Summary (Last 24 hours) at 07/01/2018 1741 Last data filed at 07/01/2018 1600 Gross per 24 hour  Intake 919.35 ml  Output 551 ml  Net 368.35 ml   Filed Weights   06/29/18 0701 06/30/18 0700 07/01/18 0135  Weight: 90.4 kg 91.9 kg 93.6 kg    Examination:  General: NAD   Cardiovascular: S1, S2 present  Respiratory:  Bibasilar crackles  Abdomen: Soft, nontender, nondistended, bowel sounds present, sacral edema  Musculoskeletal: No bilateral pedal edema noted  Skin: Normal  Psychiatry:  Normal mood    Data Reviewed: I have independently reviewed following labs and imaging studies    CBC: Recent Labs  Lab 07/20/18 1910 06/28/18 0409 06/29/18 0841 06/30/18 0502 07/01/18 0622  WBC 2.9* 3.0* 4.2 4.1 3.9*  NEUTROABS  --  2.2  --   --  2.9  HGB 8.9* 8.2* 9.1* 8.4* 8.9*  HCT 28.5* 26.7* 30.4* 28.0* 30.1*  MCV 118.3* 117.6* 120.6* 121.7* 119.9*  PLT 107* 95* 90* 92* 85*   Basic Metabolic Panel: Recent Labs  Lab 07-20-2018 1910 06/28/18 0409 06/29/18 0516 06/30/18 0502 07/01/18 0622  NA 139 137 137 135 132*  K 4.5 4.2 4.9 5.0 6.0*  CL 99 99 98 98 95*  CO2 33* 30 33* 33* 31  GLUCOSE 91 108* 95 103* 95  BUN 58* 58* 60* 68* 81*  CREATININE 2.02* 1.84* 1.90* 1.91* 2.18*  CALCIUM 8.6* 8.1* 8.6* 8.5* 8.6*  MG 2.6*  --   --   --   --     GFR: Estimated Creatinine Clearance: 27.5 mL/min (A) (by C-G formula based on SCr of 2.18 mg/dL (H)). Liver Function Tests: No results for input(s): AST, ALT, ALKPHOS, BILITOT, PROT, ALBUMIN in the last 168 hours. No results for input(s): LIPASE, AMYLASE in the last 168 hours. No results for input(s): AMMONIA in the last 168 hours. Coagulation Profile: No results for input(s): INR, PROTIME in the last 168 hours. Cardiac Enzymes: Recent Labs  Lab 07-20-2018 2146 06/28/18 0409 06/28/18 0811  TROPONINI 0.14* 0.10* 0.09*   BNP (last 3 results) No results for input(s): PROBNP in the last 8760 hours. HbA1C: No results for input(s): HGBA1C in the last 72 hours. CBG: No results for input(s): GLUCAP in the last 168 hours. Lipid Profile: No results for input(s): CHOL, HDL, LDLCALC, TRIG, CHOLHDL, LDLDIRECT in the last 72 hours. Thyroid Function Tests: No results for input(s): TSH, T4TOTAL, FREET4, T3FREE, THYROIDAB in the last 72 hours. Anemia Panel: No results for input(s): VITAMINB12, FOLATE, FERRITIN, TIBC, IRON, RETICCTPCT in the last 72 hours. Urine analysis:  Component Value Date/Time   COLORURINE YELLOW 05/05/2018 2247   APPEARANCEUR CLEAR 05/05/2018 2247   LABSPEC 1.008 05/05/2018 2247   PHURINE 5.0 05/05/2018 [place] 05/05/2018 2247   [place] NEGATIVE 05/05/2018 2247   [place] 05/05/2018 [place] 05/05/2018 2247   PROTEINUR NEGATIVE 05/05/2018 2247   NITRITE NEGATIVE 05/05/2018 2247   LEUKOCYTESUR NEGATIVE 05/05/2018 2247   Sepsis Labs: Invalid input(s): PROCALCITONIN, LACTICIDVEN  Recent Results (from the past 240 hour(s))  Culture, blood (routine x 2) Call MD if unable to obtain prior to antibiotics being given     Status: None (Preliminary result)   Collection Time: 06/27/18  9:50 PM  Result Value Ref Range Status   Specimen Description BLOOD RIGHT HAND  Final   Special Requests   Final    BOTTLES DRAWN AEROBIC  ONLY Blood Culture adequate volume   Culture   Final    NO GROWTH 4 DAYS Performed at [facility], [address] [place], [place] [address]    Report Status PENDING  Incomplete  Culture, blood (routine x 2) Call MD if unable to obtain prior to antibiotics being given     Status: None (Preliminary result)   Collection Time: 06/27/18  9:55 PM  Result Value Ref Range Status   Specimen Description BLOOD RIGHT HAND  Final   Special Requests   Final    BOTTLES DRAWN AEROBIC ONLY Blood Culture adequate volume   Culture   Final    NO GROWTH 4 DAYS Performed at [facility], [address] [place], [place] [address]    Report Status PENDING  Incomplete  MRSA PCR Screening     Status: None   Collection Time: 06/27/18 11:10 PM  Result Value Ref Range Status   MRSA by PCR NEGATIVE NEGATIVE Final    Comment:        The GeneXpert MRSA Assay (FDA approved for NASAL specimens only), is one component of a comprehensive MRSA colonization surveillance program. It is not intended to diagnose MRSA infection nor to guide or monitor treatment for MRSA infections. Performed at [facility], [place] [address], [place], [place] [address]       Radiology Studies: Dg Chest Port 1 View  Result Date: 07/01/2018 CLINICAL DATA:  Shortness of breath EXAM: PORTABLE CHEST 1 VIEW COMPARISON:  06/27/2018 FINDINGS: Cardiomegaly, pacemaker and mild pulmonary vascular congestion again noted in this low volume film. Bibasilar opacities/atelectasis are unchanged. There may be trace bilateral pleural effusions present. There is no evidence of pneumothorax. IMPRESSION: Unchanged appearance of the chest with cardiomegaly, mild pulmonary vascular congestion, bibasilar opacities/atelectasis and possible trace pleural effusions. Electronically Signed   By: Margarette Canada M.D.   On: 07/01/2018 08:37     Alma Friendly, MD [facility]  If 7PM-7AM, please contact  night-coverage [email or web address]  07/01/2018, 5:41 PM

## 2018-07-01 NOTE — Progress Notes (Signed)
Dr. [name] was paged to notify of critical ABG values

## 2018-07-01 NOTE — Consult Note (Signed)
NAME:  [name]., MRN:  [id number], DOB:  10-20-1931, LOS: 4 ADMISSION DATE:  06/27/2018, CONSULTATION DATE:  11/23 REFERRING MD:  Dr. [name], CHIEF COMPLAINT:  SOB   Brief History   84 year old male with severe COPD and CHF admitted for PNA vs CHF exacerbation and stuggled with volume status throughout admission. PCCM consulted 11/23 for worsening respiratory failure requiring BiPAP and shock requiring milrinone.   History of present illness   84 year old male with past medical history as below, which is significant for COPD (on 3 L supplemental oxygen at home), systolic congestive heart failure with recent ejection fraction 25 to 30%, CKD stage III and myelodysplasia.  He was admitted to [facility] on 11/19 with complaints of several days shortness of breath and progressive weakness.  Imaging upon admission was concerning for pneumonia and pleural effusion.  He was admitted for treatment of presumed pneumonia and treated with empiric antibiotics.  Inpatient course became complicated by worsening dyspnea which improved with IV diuretics.  His blood pressure remained borderline so he was difficult to diurese.  On 11/23 cardiology was consulted for further evaluation who felt he had low cardiac output despite hypervolemia.  IV milrinone was started in hopes that he would be able to be diuresed.  He was transferred to ICU for continued shortness of breath requiring BiPAP and the initiation of milrinone infusion.  PCCM was asked to evaluate.  Past Medical History   has a past medical history of Arthritis, CHF (congestive heart failure) ([place]), Chronic bronchitis ([place]), CKD (chronic kidney disease), stage III ([place]), Coronary artery disease, Diverticulitis, Dyspnea, GERD (gastroesophageal reflux disease), Gout, History of blood transfusion, Hyperlipidemia, Hypertension, MDS (myelodysplastic syndrome), high grade ([place]) (05/11/2018), MI (myocardial infarction) ([place]) (~ 2000), Nonischemic  cardiomyopathy ([place]), OSA on CPAP, Pacemaker single chamber, [place], 2013 (10/13/2011), Permanent atrial fibrillation, Pulmonary hypertension ([place]), and Restless legs.  Significant Hospital Events   11/19 admit 11/23 ICU transfer  Consults:  Cardiology 11/23  Procedures:    Significant Diagnostic Tests:    Micro Data:    Antimicrobials:  Cefepime 11/19 > Vanco 11/19 > 11/20  Interim history/subjective:    Objective   Blood pressure 101/68, pulse 76, temperature 98 F (36.7 C), temperature source Oral, resp. rate 17, height 5\' 7"  (1.702 m), weight 93.6 kg, SpO2 100 %.        Intake/Output Summary (Last 24 hours) at 07/01/2018 2046 Last data filed at 07/01/2018 1800 Gross per 24 hour  Intake 933.35 ml  Output 550 ml  Net 383.35 ml   Filed Weights   06/29/18 0701 06/30/18 0700 07/01/18 0135  Weight: 90.4 kg 91.9 kg 93.6 kg    Examination: General: Elderly male on BiPAP HENT: [place]/AT, PERRL, unable to appreciate JVD Lungs: Coarse Cardiovascular: RRR, no MRG Abdomen: Soft, non-tender, non-distended Extremities: No acute deformity or edema. Some scrotal edema.  Skin: Somewhat raised rash to groin and hips, family reports has been there for a few days since admission.  Neuro: Somnolent. Awake to pain and answers orientation questions appropriately.  GU: Foley  Resolved Hospital Problem list     Assessment & Plan:   Acute on chronic hypercarbic respiratory failure: in the setting of PNA, CHF, and decompensated OSA.  - Continue BiPAP - At risk for intubation. He has discussed this with family and would like to be on life support if necessary.  - Repeat ABG to ensure improvement - NPO  Shock, presumed cardiogenic +/- septic. In  the setting of Acute on chronic HFrEF 25-30% EF. - baseline SBP in mid to high 90s.   - Cardiology following - Milrinone infusion - Will place CVL for CVP and Co-ox assessment - Diuresis as tolerated - Hold further  IVF  HCAP - Has been narrowed to cefepime. Continue for 8 day course.  - Follow cultures  COPD without acute exacerbation OSA, noncompliant with CPAP - continue duonebs, PRN albuterol - BiPAP PRN and mandatory nocturnal for now.  AKI on CKD III - diuresis as tolerated - Follow BMP - hopefully he does not progress to require HD.   Myelodysplasia low grade. Followed by Dr. Marin Olp. Pancytopenia - Holding revlimid while NPO  Atrial fibrillation no longer on anticoagulation due to bleeding risk - per cardiology.  Hyperkalemia - Repeat BMP  Best practice:  Diet: NPO Pain/Anxiety/Delirium protocol (if indicated): n/a VAP protocol (if indicated): n/a DVT prophylaxis: sq heparin GI prophylaxis: protonix Glucose control:  Mobility: Bedrest Code Status: FULL Family Communication: patient, wife, and daughter Loss adjuster, chartered) updated at bedside.  Disposition: ICU  Labs   CBC: Recent Labs  Lab 06/27/18 1910 06/28/18 0409 06/29/18 0841 06/30/18 0502 07/01/18 0622  WBC 2.9* 3.0* 4.2 4.1 3.9*  NEUTROABS  --  2.2  --   --  2.9  HGB 8.9* 8.2* 9.1* 8.4* 8.9*  HCT 28.5* 26.7* 30.4* 28.0* 30.1*  MCV 118.3* 117.6* 120.6* 121.7* 119.9*  PLT 107* 95* 90* 92* 85*    Basic Metabolic Panel: Recent Labs  Lab 06/27/18 1910 06/28/18 0409 06/29/18 0516 06/30/18 0502 07/01/18 0622  NA 139 137 137 135 132*  K 4.5 4.2 4.9 5.0 6.0*  CL 99 99 98 98 95*  CO2 33* 30 33* 33* 31  GLUCOSE 91 108* 95 103* 95  BUN 58* 58* 60* 68* 81*  CREATININE 2.02* 1.84* 1.90* 1.91* 2.18*  CALCIUM 8.6* 8.1* 8.6* 8.5* 8.6*  MG 2.6*  --   --   --   --    GFR: Estimated Creatinine Clearance: 27.5 mL/min (A) (by C-G formula based on SCr of 2.18 mg/dL (H)). Recent Labs  Lab 06/27/18 2049 06/28/18 0409 06/29/18 0841 06/30/18 0502 07/01/18 0622  WBC  --  3.0* 4.2 4.1 3.9*  LATICACIDVEN 0.75  --   --   --   --     Liver Function Tests: No results for input(s): AST, ALT, ALKPHOS, BILITOT, PROT, ALBUMIN in  the last 168 hours. No results for input(s): LIPASE, AMYLASE in the last 168 hours. No results for input(s): AMMONIA in the last 168 hours.  ABG    Component Value Date/Time   PHART 7.267 (L) 07/01/2018 1653   PCO2ART 72.6 (HH) 07/01/2018 1653   PO2ART 119.0 (H) 07/01/2018 1653   HCO3 33.4 (H) 07/01/2018 1653   TCO2 36 (H) 07/01/2018 1653   ACIDBASEDEF 1.0 01/12/2018 0935   O2SAT 98.0 07/01/2018 1653     Coagulation Profile: No results for input(s): INR, PROTIME in the last 168 hours.  Cardiac Enzymes: Recent Labs  Lab 06/27/18 2146 06/28/18 0409 06/28/18 0811  TROPONINI 0.14* 0.10* 0.09*    HbA1C: Hgb A1c MFr Bld  Date/Time Value Ref Range Status  01/12/2018 09:31 AM 5.2 4.8 - 5.6 % Final    Comment:    (NOTE) Pre diabetes:          5.7%-6.4% Diabetes:              >6.4% Glycemic control for   <7.0% adults with diabetes   11/03/2017  04:07 PM 4.5 (L) 4.8 - 5.6 % Final    Comment:    (NOTE) Pre diabetes:          5.7%-6.4% Diabetes:              >6.4% Glycemic control for   <7.0% adults with diabetes     CBG: No results for input(s): GLUCAP in the last 168 hours.  Review of Systems:   Unable due to encephalopathy.   Past Medical History  He,  has a past medical history of Arthritis, CHF (congestive heart failure) ([place]), Chronic bronchitis ([place]), CKD (chronic kidney disease), stage III ([place]), Coronary artery disease, Diverticulitis, Dyspnea, GERD (gastroesophageal reflux disease), Gout, History of blood transfusion, Hyperlipidemia, Hypertension, MDS (myelodysplastic syndrome), high grade ([place]) (05/11/2018), MI (myocardial infarction) ([place]) (~ 2000), Nonischemic cardiomyopathy ([place]), OSA on CPAP, Pacemaker single chamber, [place], 2013 (10/13/2011), Permanent atrial fibrillation, Pulmonary hypertension ([place]), and Restless legs.   Surgical History    Past Surgical History:  Procedure Laterality Date  . APPENDECTOMY  12/2000   [name] 12/22/2010   . BIV UPGRADE N/A 02/22/2018   Procedure: BIV UPGRADE;  Surgeon: [name], MD;  Location: [facility];  Service: Cardiovascular;  Laterality: N/A;  . BIV UPGRADE N/A 03/29/2018   Procedure: BIV PPM UPGRADE;  Surgeon: [name], MD;  Location: [facility];  Service: Cardiovascular;  Laterality: N/A;  . CARDIAC CATHETERIZATION  11/07/2008   nonischemic cardiomyopathy,pulmonary hypertension  . CATARACT EXTRACTION W/ INTRAOCULAR LENS  IMPLANT, BILATERAL Bilateral   . CIRCUMCISION  12/2005   [name] 12/22/2010  . COLOSTOMY  12/2000   [name] 12/22/2010  . COLOSTOMY REVERSAL  07/2001   [name] 12/22/2010  . CORONARY ANGIOPLASTY  06/01/1999   successful to ostium of the first diagonal  . ESOPHAGOGASTRODUODENOSCOPY N/A 08/22/2013   Procedure: ESOPHAGOGASTRODUODENOSCOPY (EGD);  Surgeon: [name], MD;  Location: [facility] ENDOSCOPY;  Service: Endoscopy;  Laterality: N/A;  h/p in file cabinet, jackie  . ESOPHAGOGASTRODUODENOSCOPY N/A 11/05/2014   Procedure: ESOPHAGOGASTRODUODENOSCOPY (EGD);  Surgeon: Clarene Essex, MD;  Location: [facility] ENDOSCOPY;  Service: Endoscopy;  Laterality: N/A;  . ESOPHAGOGASTRODUODENOSCOPY N/A 11/08/2016   Procedure: ESOPHAGOGASTRODUODENOSCOPY (EGD);  Surgeon: [name], MD;  Location[facility] END[facility]COPY;  Service: Endoscopy;  Laterality: N/A;  . ESOPHAGOGASTRODUODENOSCOPY (EGD) WITH PROPOFOL Left 11/05/2017   Procedure: ESOPHAGOGASTRODUODENOSCOPY (EGD) WITH PROPOFOL;  Surgeon: [name], MD;  Location: [facility];  Service: Endoscopy;  Laterality: Left;  . HOT HEMOSTASIS N/A 11/05/2014   Procedure: HOT HEMOSTASIS (ARGON PLASMA COAGULATION/BICAP);  Surgeon: Clarene Essex, MD;  Location: [facility] ENDOSCOPY;  Service: Endoscopy;  Laterality: N/A;  . INSERT / REPLACE / REMOVE PACEMAKER    . JOINT REPLACEMENT    . MASS EXCISION Left    hand w/ulnar artery reconstruction/notes 12/22/2010  . NM MYOCAR PERF WALL MOTION  11/24/2007   normal  . PERMANENT PACEMAKER INSERTION   10/04/2012   [employer]  . PERMANENT PACEMAKER INSERTION N/A 10/12/2011   Procedure: PERMANENT PACEMAKER INSERTION;  Surgeon: [name], MD;  Location: [facility] C[facility] LAB;  Service: Cardiovascular;  Laterality: N/A;  . REPLACEMENT TOTAL KNEE Bilateral 11/2006   right-left/notes 12/22/2010  . ROTATOR CUFF REPAIR Right 09/2004   [name] 12/22/2010  . SAVORY DILATION N/A 08/22/2013   Procedure: SAVORY DILATION;  Surgeon: [name], MD;  Location: [facility] ENDOSCOPY;  Service: Endoscopy;  Laterality: N/A;  . SHOULDER SURGERY Right    "fell off house; messed up 3 things in my arm"  .  TONSILLECTOMY    . US ECHOCARDIOGRAPHY  02/01/2011   LA is mod-severely dilated,AOV & root sclerotic,ca+ AOV leaflets     Social History   reports that he has quit smoking. He has never used smokeless tobacco. He reports that he does not drink alcohol or use drugs.   Family History   His family history includes Cancer in his mother; Diabetes in his brother; Heart attack in his father.   Allergies Allergies  Allergen Reactions  . Vicodin [Hydrocodone-Acetaminophen] Itching and Nausea Only     Home Medications  Prior to Admission medications   Medication Sig Start Date End Date Taking? Authorizing Provider  acetaminophen (TYLENOL) 650 MG CR tablet Take 1,300 mg by mouth every 8 (eight) hours as needed for pain.   Yes [provider]  allopurinol (ZYLOPRIM) 100 MG tablet Take 2 tablets (200 mg total) by mouth daily. Patient taking differently: Take 200 mg by mouth daily as needed (for gout flares).  05/29/18  Yes Medina-Vargas, Monina C, NP  aspirin EC 81 MG EC tablet Take 1 tablet (81 mg total) by mouth daily. 05/12/18  Yes Alma Friendly, MD  Dextran 70-Hypromellose (ARTIFICIAL TEARS PF OP) Place 2 drops into both eyes 3 (three) times daily as needed (for dryness).   Yes [provider]  diphenhydrAMINE-zinc acetate (BENADRYL) cream Apply topically 2 (two) times daily as needed for  itching. 05/11/18  Yes Alma Friendly, MD  dutasteride (AVODART) 0.5 MG capsule Take 1 capsule (0.5 mg total) by mouth daily. 05/29/18  Yes Medina-Vargas, Monina C, NP  escitalopram (LEXAPRO) 5 MG tablet Take 1 tablet (5 mg total) by mouth at bedtime. 05/29/18  Yes Medina-Vargas, Monina C, NP  furosemide (LASIX) 80 MG tablet Take 1 tablet (80 mg total) by mouth 2 (two) times daily. 05/29/18  Yes Medina-Vargas, Monina C, NP  ipratropium-albuterol (DUONEB) 0.5-2.5 (3) MG/3ML SOLN Take 3 mLs by nebulization every 4 (four) hours as needed. Patient taking differently: Take 3 mLs by nebulization every 4 (four) hours as needed (for wheezing or shortness of breath).  05/29/18  Yes Medina-Vargas, Monina C, NP  loratadine (CLARITIN) 10 MG tablet Take 10 mg by mouth daily as needed for allergies or itching.    Yes [provider]  mirtazapine (REMERON) 15 MG tablet Take 1 tablet (15 mg total) by mouth at bedtime. 05/29/18  Yes Medina-Vargas, Monina C, NP  Multiple Vitamin (MULTIVITAMIN WITH MINERALS) TABS tablet Take 1 tablet by mouth daily.   Yes [provider]  Multiple Vitamins-Minerals (PRESERVISION AREDS 2) CAPS Take 1 capsule by mouth daily.   Yes [provider]  OXYGEN Inhale 3 L into the lungs as needed (for shortness of breath).    Yes [provider]  pantoprazole (PROTONIX) 40 MG tablet Take 1 tablet (40 mg total) by mouth daily. 05/29/18  Yes Medina-Vargas, Monina C, NP  potassium chloride (K-DUR) 10 MEQ tablet Take 1 tablet (10 mEq total) by mouth 2 (two) times daily. 05/29/18  Yes Medina-Vargas, Monina C, NP  REVLIMID 5 MG capsule Take 5 mg by mouth See admin instructions. Take 5 mg by mouth once a day for 21 days, then "off" for 7 days, then repeat 06/16/18  Yes [provider]  rOPINIRole (REQUIP) 4 MG tablet Take 1 tablet (4 mg total) by mouth at bedtime. 05/29/18  Yes Medina-Vargas, Monina C, NP  tamsulosin (FLOMAX) 0.4 MG CAPS capsule Take 1  capsule (0.4 mg total) by mouth daily after breakfast. 05/29/18  Yes Medina-Vargas, Monina C, NP  triamcinolone cream (KENALOG) 0.1 % Apply 1 application topically 2 (two) times daily. Patient taking differently: Apply 1 application topically See admin instructions. Apply to itchy areas two times a day as directed 05/29/18  Yes Medina-Vargas, Monina C, NP  VENTOLIN HFA 108 (90 Base) MCG/ACT inhaler Inhale 2 puffs into the lungs every 6 (six) hours as needed for wheezing or shortness of breath.  06/20/18  Yes [provider]     Critical care time: 40 mins    Georgann Housekeeper, AGACNP-BC [place] Pager [phone] or [phone]  07/01/2018 9:14 PM

## 2018-07-01 NOTE — Progress Notes (Signed)
Respiratory is in bed side for BIPAP, stat  Lasix given, IV Milrinone started @7 .02 cc/hr, latest vitals 98/60 SPO2 100 in 2l/oxygen, pulse 70, temp 98  Called for report on 3MW, left the number to call back  Palma Holter, Therapist, sports

## 2018-07-01 NOTE — Progress Notes (Signed)
Report provided to the receiving RN, Respiratory in side with BIPAP, pt's family member informed (wife), pt is transferred to 3 MW 12 for higher level of care, vitals stable prior to transfer  Palma Holter, Therapist, sports

## 2018-07-01 NOTE — Progress Notes (Signed)
Critical ABG values RBV Thereasa Distance, RN at 1650 on 07/01/2018 by Otho Ket, RRT.

## 2018-07-01 NOTE — Progress Notes (Signed)
Patient complains of abdominal pain. Bladder scan shows 119cc. Maalox given for possible heartburn.

## 2018-07-01 NOTE — Progress Notes (Addendum)
Called  MD Dr [name] regarding abnormal blood gas values, MD is putting order for BIPAP and transfer  Palma Holter, RN

## 2018-07-01 NOTE — Consult Note (Addendum)
Cardiology Consultation:   Patient ID: Anthony COCUZZA Sr. MRN: [id number]; DOB: 01-23-1932  Admit date: 06/27/2018 Date of Consult: 07/01/2018  Primary Care Provider: [name], MD Primary Cardiologist: [name], MD   Patient Profile:   Anthony Agar Sr. is a 84 y.o. male with a hx of PMH of CKD stage III, afib, HTN, HLD, MDS, NICM, symptomatic bradycardia s/p [employer] CRT-P, COPD on home O2, OSA, RLS and pulmonary HTN who is being seen today for the evaluation of CHF  at the request of Dr. [name].   Patient had a long-standing history of nonischemic cardiomyopathy, EF was 40 to 45% in 2010.  She underwent cardiac catheterization which showed normal coronaries with right dominated system.  Patient has a history of atrial fibrillation with slow ventricular rate. She had a single chamber maker placed in March 2013.  Echocardiogram in both 2016 and 2018 shows normal ejection fraction.  In February 2019, patient was admitted for acute on chronic systolic heart failure.  Echocardiogram obtained on 09/21/2017 showed EF down to 25 to 30%, mild MR, peak PA pressure of 111 mmHg.  Patient was admitted in March 2019 for symptomatic anemia.  He required blood transfusion.  EGD showed angiodysplastic lesion in the gastric antrum and duodenum.  His Eliquis was finally discontinued in June 2019 after it was felt he is too high a risk for bleeding.  Repeat echocardiogram obtained on 01/14/2015 showed EF continue to be down at 25 to 30%, akinesis of the mid apical anteroseptal and inferoseptal myocardium, mild aortic regurgitation, mild MR, severe LAE and RAE, peak PA pressure 53 mmHg.  In Aug 2019, patient underwent CRT-P upgrade by Dr. [name].    Found to have pancytopenia  secondary to myelodysplastic syndrome when admitted for CHF 05/03/18. His dry weight is around 180lb.   History of Present Illness:   Mr. Sollenberger presented 11/19 with progressive weakness and shortness of breath. Chest x-ray on  admission showed small left pleural effusion with consolidation, suspicious for infiltrate. Treated with abx for presumed pneumonia. His lasix was hold due to hypotension. Given NS and IV lasix initially. His Scr and weight has been up since admission. Given IV lasix 20mg  this morning and cardiology is asked for further management. He was on potassium supplement since admit. Now hyperkalemic. Remained hypotension.   Patient is lethargic. Hx obtained from chart review and family at bedside. Per wife, patient has progressive dyspnea and orthopnea prior to presentation. Can not lay flat here.  No chest pain or palpitation.   Past Medical History:  Diagnosis Date  . Arthritis    "feet" (09/30/2017)  . CHF (congestive heart failure) ([place])   . Chronic bronchitis ([name])   . CKD (chronic kidney disease), stage III ([name])    [name] 09/30/2017  . Coronary artery disease   . Diverticulitis   . Dyspnea   . GERD (gastroesophageal reflux disease)   . Gout   . History of blood transfusion    "blood loss" (09/30/2017)  . Hyperlipidemia   . Hypertension   . MDS (myelodysplastic syndrome), high grade ([name]) 05/11/2018  . MI (myocardial infarction) ([name]) ~ 2000   "light one"  . Nonischemic cardiomyopathy ([facility])    mild  . OSA on CPAP   . Pacemaker single chamber, [place], 2013 10/13/2011  . Permanent atrial fibrillation    on Eliquis  . Pulmonary hypertension ([name])   . Restless legs     Past Surgical History:  Procedure Laterality  Date  . APPENDECTOMY  12/2000   [name] 12/22/2010  . BIV UPGRADE N/A 02/22/2018   Procedure: BIV UPGRADE;  Surgeon: [name], MD;  Location: [place] [facility];  Service: Cardiovascular;  Laterality: N/A;  . BIV UPGRADE N/A 03/29/2018   Procedure: BIV PPM UPGRADE;  Surgeon: [name], MD;  Location: [place] [facility];  Service: Cardiovascular;  Laterality: N/A;  . CARDIAC CATHETERIZATION  11/07/2008   nonischemic cardiomyopathy,pulmonary  hypertension  . CATARACT EXTRACTION W/ INTRAOCULAR LENS  IMPLANT, BILATERAL Bilateral   . CIRCUMCISION  12/2005   [name] 12/22/2010  . COLOSTOMY  12/2000   [name] 12/22/2010  . COLOSTOMY REVERSAL  07/2001   [name] 12/22/2010  . CORONARY ANGIOPLASTY  06/01/1999   successful to ostium of the first diagonal  . ESOPHAGOGASTRODUODENOSCOPY N/A 08/22/2013   Procedure: ESOPHAGOGASTRODUODENOSCOPY (EGD);  Surgeon: [name], MD;  Location: [facility] ENDOSCOPY;  Service: Endoscopy;  Laterality: N/A;  h/p in file cabinet, jackie  . ESOPHAGOGASTRODUODENOSCOPY N/A 11/05/2014   Procedure: ESOPHAGOGASTRODUODENOSCOPY (EGD);  Surgeon: Clarene Essex, MD;  Location: [facility] ENDOSCOPY;  Service: Endoscopy;  Laterality: N/A;  . ESOPHAGOGASTRODUODENOSCOPY N/A 11/08/2016   Procedure: ESOPHAGOGASTRODUODENOSCOPY (EGD);  Surgeon: [name], MD;  Location: [facility] ENDOSCOPY;  Service: Endoscopy;  Laterality: N/A;  . ESOPHAGOGASTRODUODENOSCOPY (EGD) WITH PROPOFOL Left 11/05/2017   Procedure: ESOPHAGOGASTRODUODENOSCOPY (EGD) WITH PROPOFOL;  Surgeon: [name], MD;  Location: [place];  Service: Endoscopy;  Laterality: Left;  . HOT HEMOSTASIS N/A 11/05/2014   Procedure: HOT HEMOSTASIS (ARGON PLASMA COAGULATION/BICAP);  Surgeon: Clarene Essex, MD;  Location: [facility] ENDOSCOPY;  Service: Endoscopy;  Laterality: N/A;  . INSERT / REPLACE / REMOVE PACEMAKER    . JOINT REPLACEMENT    . MASS EXCISION Left    hand w/ulnar artery reconstruction/notes 12/22/2010  . NM MYOCAR PERF WALL MOTION  11/24/2007   normal  . PERMANENT PACEMAKER INSERTION  10/04/2012   [employer]  . PERMANENT PACEMAKER INSERTION N/A 10/12/2011   Procedure: PERMANENT PACEMAKER INSERTION;  Surgeon: [name], MD;  Location: [place] CATH LAB;  Service: Cardiovascular;  Laterality: N/A;  . REPLACEMENT TOTAL KNEE Bilateral 11/2006   right-left/notes 12/22/2010  . ROTATOR CUFF REPAIR Right 09/2004   [name] 12/22/2010  . SAVORY DILATION N/A 08/22/2013   Procedure: SAVORY  DILATION;  Surgeon: [name], MD;  Location: [facility] ENDOSCOPY;  Service: Endoscopy;  Laterality: N/A;  . SHOULDER SURGERY Right    "fell off house; messed up 3 things in my arm"  . TONSILLECTOMY    . US ECHOCARDIOGRAPHY  02/01/2011   LA is mod-severely dilated,AOV & root sclerotic,ca+ AOV leaflets     Inpatient Medications: Scheduled Meds: . dutasteride  0.5 mg Oral Daily  . escitalopram  5 mg Oral QHS  . heparin injection (subcutaneous)  5,000 Units Subcutaneous Q8H  . lenalidomide  5 mg Oral QHS  . mirtazapine  15 mg Oral QHS  . pantoprazole  40 mg Oral Daily  . rOPINIRole  4 mg Oral QHS  . sodium chloride flush  3 mL Intravenous Q12H  . tamsulosin  0.4 mg Oral QPC breakfast   Continuous Infusions: . sodium chloride Stopped (06/29/18 0045)  . ceFEPime (MAXIPIME) IV 1 g (07/01/18 0014)   PRN Meds: sodium chloride, albuterol, alum & mag hydroxide-simeth, ipratropium-albuterol, loratadine, sodium chloride flush  Allergies:    Allergies  Allergen Reactions  . Vicodin [Hydrocodone-Acetaminophen] Itching and Nausea Only    Social History:   Social History   Socioeconomic History  .  Marital status: Married    Spouse name: Not on file  . Number of children: Not on file  . Years of education: Not on file  . Highest education level: Not on file  Occupational History  . Occupation: retired  Scientific laboratory technician  . Financial resource strain: Not on file  . Food insecurity:    Worry: Not on file    Inability: Not on file  . Transportation needs:    Medical: Not on file    Non-medical: Not on file  Tobacco Use  . Smoking status: Former Research scientist (life sciences)  . Smokeless tobacco: Never Used  . Tobacco comment: 09/30/2017 "it's been over 75yr since I smoked anything"  Substance and Sexual Activity  . Alcohol use: No  . Drug use: No  . Sexual activity: Not Currently  Lifestyle  . Physical activity:    Days per week: Not on file    Minutes per session: Not on file  . Stress: Not on file    Relationships  . Social connections:    Talks on phone: Not on file    Gets together: Not on file    Attends religious service: Not on file    Active member of club or organization: Not on file    Attends meetings of clubs or organizations: Not on file    Relationship status: Not on file  . Intimate partner violence:    Fear of current or ex partner: Not on file    Emotionally abused: Not on file    Physically abused: Not on file    Forced sexual activity: Not on file  Other Topics Concern  . Not on file  Social History Narrative  . Not on file    Family History:   Family History  Problem Relation Age of Onset  . Cancer Mother   . Heart attack Father   . Diabetes Brother      ROS:  Please see the history of present illness.  All other ROS reviewed and negative.     Physical Exam/Data:   Vitals:   06/30/18 2011 07/01/18 0135 07/01/18 0606 07/01/18 1216  BP: (!) 93/54  106/60 97/60  Pulse: 71  69 72  Resp: 20  18 18   Temp: 97.8 F (36.6 C)  (!) 97.5 F (36.4 C) 97.8 F (36.6 C)  TempSrc: Oral  Oral Oral  SpO2: 100%  94% 98%  Weight:  93.6 kg    Height:        Intake/Output Summary (Last 24 hours) at 07/01/2018 1305 Last data filed at 07/01/2018 1100 Gross per 24 hour  Intake 540 ml  Output 351 ml  Net 189 ml   Filed Weights   06/29/18 0701 06/30/18 0700 07/01/18 0135  Weight: 90.4 kg 91.9 kg 93.6 kg   Body mass index is 32.33 kg/m.  General:  Obese male in no acute distress HEENT: normal Lymph: no adenopathy Neck: + JVD Endocrine:  No thryomegaly Vascular: No carotid bruits; FA pulses 2+ bilaterally without bruits  Cardiac:  normal S1, S2; RRR; no murmur Lungs:  clear to auscultation bilaterally, no wheezing, rhonchi or rales  Abd: soft, nontender, no hepatomegaly  Ext: no edema Musculoskeletal:  No deformities, BUE and BLE strength normal and equal Skin: warm and dry  Neuro:   no focal abnormalities noted Psych:  lethargic   EKG:  The EKG  was personally reviewed and demonstrates:  Paced rhythm at 70 bpm Telemetry:  Telemetry was personally reviewed and demonstrates:  Paced  rhythm   Relevant CV Studies:  Echo 01/13/18 Study Conclusions  - Left ventricle: The cavity size was normal. Systolic function was   severely reduced. The estimated ejection fraction was in the   range of 25% to 30%. There is akinesis of the   mid-apicalanteroseptal and inferoseptal myocardium. The study is   not technically sufficient to allow evaluation of LV diastolic   function. - Aortic valve: Trileaflet; moderately thickened, moderately   calcified leaflets. Transvalvular velocity was minimally   increased. There was no stenosis. There was mild regurgitation.   Peak velocity (S): 233 cm/s. Valve area (VTI): 1.49 cm^2. Valve   area (Vmax): 1.6 cm^2. Valve area (Vmean): 1.44 cm^2. - Mitral valve: There was mild regurgitation. - Left atrium: The atrium was severely dilated. - Right ventricle: The cavity size was moderately dilated. Wall   thickness was normal. Pacer wire or catheter noted in right   ventricle. - Right atrium: The atrium was severely dilated. - Tricuspid valve: There was moderate regurgitation. - Pulmonary arteries: Systolic pressure was moderately increased.   PA peak pressure: 53 mm Hg (S).  Laboratory Data:  Chemistry Recent Labs  Lab 06/29/18 0516 06/30/18 0502 07/01/18 0622  NA 137 135 132*  K 4.9 5.0 6.0*  CL 98 98 95*  CO2 33* 33* 31  GLUCOSE 95 103* 95  BUN 60* 68* 81*  CREATININE 1.90* 1.91* 2.18*  CALCIUM 8.6* 8.5* 8.6*  GFRNONAA 31* 31* 26*  GFRAA 36* 35* 30*  ANIONGAP 6 4* 6    No results for input(s): PROT, ALBUMIN, AST, ALT, ALKPHOS, BILITOT in the last 168 hours. Hematology Recent Labs  Lab 06/29/18 0841 06/30/18 0502 07/01/18 0622  WBC 4.2 4.1 3.9*  RBC 2.52* 2.30* 2.51*  HGB 9.1* 8.4* 8.9*  HCT 30.4* 28.0* 30.1*  MCV 120.6* 121.7* 119.9*  MCH 36.1* 36.5* 35.5*  MCHC 29.9* 30.0 29.6*   RDW 19.5* 19.5* 19.4*  PLT 90* 92* 85*   Cardiac Enzymes Recent Labs  Lab 06/27/18 2146 06/28/18 0409 06/28/18 0811  TROPONINI 0.14* 0.10* 0.09*    Recent Labs  Lab 06/27/18 1915  TROPIPOC 0.10*    BNP Recent Labs  Lab 06/27/18 1910  BNP 1,678.1*    DDimer No results for input(s): DDIMER in the last 168 hours.  Radiology/Studies:  Dg Chest Port 1 View  Result Date: 07/01/2018 CLINICAL DATA:  Shortness of breath EXAM: PORTABLE CHEST 1 VIEW COMPARISON:  06/27/2018 FINDINGS: Cardiomegaly, pacemaker and mild pulmonary vascular congestion again noted in this low volume film. Bibasilar opacities/atelectasis are unchanged. There may be trace bilateral pleural effusions present. There is no evidence of pneumothorax. IMPRESSION: Unchanged appearance of the chest with cardiomegaly, mild pulmonary vascular congestion, bibasilar opacities/atelectasis and possible trace pleural effusions. Electronically Signed   By: Margarette Canada M.D.   On: 07/01/2018 08:37   Dg Chest Portable 1 View  Result Date: 06/27/2018 CLINICAL DATA:  Shortness of breath EXAM: PORTABLE CHEST 1 VIEW COMPARISON:  May 03, 2018 FINDINGS: There is consolidation in the left base with small left pleural effusion. There is mild right base atelectasis. There is cardiomegaly. Pacemaker leads are attached the right ventricle and coronary sinus. There is aortic atherosclerosis. No adenopathy. There is postoperative change in the right shoulder. IMPRESSION: Consolidation left base with small left pleural effusion. Suspect pneumonia left base. There is mild atelectasis in the right base. Cardiomegaly with pacemaker leads stable. There is aortic atherosclerosis. No evident adenopathy. Aortic Atherosclerosis (ICD10-I70.0). Electronically Signed   By:  Lowella Grip III M.D.   On: 06/27/2018 19:35    Assessment and Plan:   1. Acute on chronic systolic CHF - Patient got IV lasix 80mg  x 2 and NS 250cc on 11/20. Held since then,  now his weight jump from 190lb from admit to 206lb today. Creatinine initially 2.02 >> improved to 1.84>> 2.18 today. Net I & O +986cc. His baseline weight is about 180lb.  He has clear evidence of volume overload by exam. Given IV lasix 20mg  x 1 today. Remained hypotensive.   2. Acute on CKD stage III - Baseline creatinine around 1.3. Today 2.18.   3. Hyperkalemia - He was getting K supplement while hold diuretics. Follow closely with diuresis now.   4. SSS s/p Pacemaker  5. Atrial fibrillation  - Rate controlled. No longer on anticoagulation due to significant bleeding risk.          For questions or updates, please contact [place] Please consult [email or web address] for contact info under     SignedLeanor Kail, PA  07/01/2018 1:05 PM   I have seen and examined the patient along with Leanor Kail, PA .  I have reviewed the chart, notes and new data.  I agree with PA/NP's note.  Key new complaints: he is lethargic, but mostly oriented; denies angina; wet cough Key examination changes: JVP elevated, weight >20 lb above estimated dry weight, no leg edema (has sacral edema), distant heart sounds Key new findings / data: "mild congestion" on CXR, K 6.0, creatinine 2.18 (baseline 1.2-1.3), CO2 31, ECG on 11/19 showed AFib (permanent) with BiV pacing, currently not on telemetry.  PLAN: Markedly hypervolemic. Oliguric. Suspect he is "cold and wet" (low cardiac output despite hypervolemia). He has biventricular heart failure. I am also concerned that he is hypercapnic, which explains his lethargy (and might explain his hyperkalemia if he is acidotic). He has moderate pulmonary HTN and evidence of RV dysfunction, both of which will worsen with hypoventilation.   Start IV milrinone, preferably via PICC line so we can also get CoOx. Milrinone will help as pulmonary vasodilator and as inotrope for failing RV/LV. (His baseline renal function is only mildly abnormal, he is  not an anticipated hemodialysis candidate). Bolus IV furosemide - if good response, will probably start a continuous drip. May need BiPAP if ABG shows decompensated respiratory acidosis. Place back on telemetry. Evaluate for adequacy of BiV pacing.  [name], MD, [place] [phone] 07/01/2018, 2:08 PM

## 2018-07-01 NOTE — Progress Notes (Addendum)
Pt is alert but lethargic, seen by cardiologist, putting the new orders,ABG and Lasix IV and Milrinone IV, will continue to monitor  Palma Holter, RN

## 2018-07-01 NOTE — Progress Notes (Signed)
Pt's wife wants to consult his oncologist and cardiologist (dr. [name]) while they are in hospital, she think that may be his Cancer medicine has something to do with making his kidney function worse. paged MD regarding her concern.  [name]

## 2018-07-02 DIAGNOSIS — D469 Myelodysplastic syndrome, unspecified: Secondary | ICD-10-CM

## 2018-07-02 DIAGNOSIS — I2721 Secondary pulmonary arterial hypertension: Secondary | ICD-10-CM

## 2018-07-02 DIAGNOSIS — I5043 Acute on chronic combined systolic (congestive) and diastolic (congestive) heart failure: Secondary | ICD-10-CM

## 2018-07-02 DIAGNOSIS — R570 Cardiogenic shock: Secondary | ICD-10-CM

## 2018-07-02 DIAGNOSIS — I50813 Acute on chronic right heart failure: Secondary | ICD-10-CM

## 2018-07-02 LAB — BASIC METABOLIC PANEL
ANION GAP: 8 (ref 5–15)
BUN: 81 mg/dL — AB (ref 8–23)
CO2: 30 mmol/L (ref 22–32)
Calcium: 8.6 mg/dL — ABNORMAL LOW (ref 8.9–10.3)
Chloride: 95 mmol/L — ABNORMAL LOW (ref 98–111)
Creatinine, Ser: 1.97 mg/dL — ABNORMAL HIGH (ref 0.61–1.24)
GFR calc Af Amer: 34 mL/min — ABNORMAL LOW (ref >60)
GFR, EST NON AFRICAN AMERICAN: 29 mL/min — AB (ref >60)
GLUCOSE: 77 mg/dL (ref 70–99)
Potassium: 5.3 mmol/L — ABNORMAL HIGH (ref 3.5–5.1)
Sodium: 133 mmol/L — ABNORMAL LOW (ref 135–145)

## 2018-07-02 LAB — CBC WITH DIFFERENTIAL/PLATELET
Abs Immature Granulocytes: 0.02 10*3/uL (ref 0.00–0.07)
Basophils Absolute: 0 10*3/uL (ref 0.0–0.1)
Basophils Relative: 1 %
Eosinophils Absolute: 0.1 10*3/uL (ref 0.0–0.5)
Eosinophils Relative: 3 %
HCT: 26.1 % — ABNORMAL LOW (ref 39.0–52.0)
Hemoglobin: 8 g/dL — ABNORMAL LOW (ref 13.0–17.0)
IMMATURE GRANULOCYTES: 1 %
LYMPHS ABS: 0.5 10*3/uL — AB (ref 0.7–4.0)
Lymphocytes Relative: 18 %
MCH: 35.9 pg — ABNORMAL HIGH (ref 26.0–34.0)
MCHC: 30.7 g/dL (ref 30.0–36.0)
MCV: 117 fL — AB (ref 80.0–100.0)
MONOS PCT: 13 %
Monocytes Absolute: 0.4 10*3/uL (ref 0.1–1.0)
NEUTROS ABS: 1.9 10*3/uL (ref 1.7–7.7)
NEUTROS PCT: 64 %
Platelets: 74 10*3/uL — ABNORMAL LOW (ref 150–400)
RBC: 2.23 MIL/uL — ABNORMAL LOW (ref 4.22–5.81)
RDW: 19.3 % — ABNORMAL HIGH (ref 11.5–15.5)
WBC: 3 10*3/uL — ABNORMAL LOW (ref 4.0–10.5)
nRBC: 1 % — ABNORMAL HIGH (ref 0.0–0.2)

## 2018-07-02 LAB — POCT I-STAT 3, ART BLOOD GAS (G3+)
Acid-Base Excess: 6 mmol/L — ABNORMAL HIGH (ref 0.0–2.0)
Bicarbonate: 31.6 mmol/L — ABNORMAL HIGH (ref 20.0–28.0)
O2 Saturation: 93 %
TCO2: 33 mmol/L — ABNORMAL HIGH (ref 22–32)
pCO2 arterial: 50.8 mmHg — ABNORMAL HIGH (ref 32.0–48.0)
pH, Arterial: 7.399 (ref 7.350–7.450)
pO2, Arterial: 65 mmHg — ABNORMAL LOW (ref 83.0–108.0)

## 2018-07-02 LAB — GLUCOSE, CAPILLARY
GLUCOSE-CAPILLARY: 64 mg/dL — AB (ref 70–99)
GLUCOSE-CAPILLARY: 85 mg/dL (ref 70–99)
GLUCOSE-CAPILLARY: 106 mg/dL — AB (ref 70–99)
GLUCOSE-CAPILLARY: 107 mg/dL — AB (ref 70–99)
Glucose-Capillary: 81 mg/dL (ref 70–99)
Glucose-Capillary: 82 mg/dL (ref 70–99)

## 2018-07-02 LAB — COOXEMETRY PANEL
CARBOXYHEMOGLOBIN: 2 % — AB (ref 0.5–1.5)
Methemoglobin: 2.1 % — ABNORMAL HIGH (ref 0.0–1.5)
O2 Saturation: 63.7 %
Total hemoglobin: 7.7 g/dL — ABNORMAL LOW (ref 12.0–16.0)

## 2018-07-02 LAB — CULTURE, BLOOD (ROUTINE X 2)
CULTURE: NO GROWTH
Culture: NO GROWTH
SPECIAL REQUESTS: ADEQUATE
SPECIAL REQUESTS: ADEQUATE

## 2018-07-02 MED ORDER — DEXTROSE 5 % IV SOLN
INTRAVENOUS | Status: DC
  Administered 2018-07-02: 05:00:00 via INTRAVENOUS

## 2018-07-02 MED ORDER — ORAL CARE MOUTH RINSE
15.0000 mL | Freq: Two times a day (BID) | OROMUCOSAL | Status: DC
  Administered 2018-07-02 (×2): 15 mL via OROMUCOSAL

## 2018-07-02 MED ORDER — ACETAMINOPHEN 325 MG PO TABS
650.0000 mg | ORAL_TABLET | Freq: Four times a day (QID) | ORAL | Status: DC | PRN
  Administered 2018-07-02 – 2018-07-04 (×3): 650 mg via ORAL
  Filled 2018-07-02 (×4): qty 2

## 2018-07-02 MED ORDER — DEXTROSE 50 % IV SOLN
INTRAVENOUS | Status: AC
  Administered 2018-07-02: 50 mL
  Filled 2018-07-02: qty 50

## 2018-07-02 MED ORDER — DEXTROSE 10 % IV SOLN
INTRAVENOUS | Status: DC
  Administered 2018-07-02: 07:00:00 via INTRAVENOUS

## 2018-07-02 MED ORDER — ACETAMINOPHEN 500 MG PO TABS
1000.0000 mg | ORAL_TABLET | Freq: Once | ORAL | Status: AC
  Administered 2018-07-02: 1000 mg via ORAL
  Filled 2018-07-02: qty 2

## 2018-07-02 MED ORDER — IPRATROPIUM-ALBUTEROL 0.5-2.5 (3) MG/3ML IN SOLN
3.0000 mL | Freq: Four times a day (QID) | RESPIRATORY_TRACT | Status: DC
  Administered 2018-07-02: 3 mL via RESPIRATORY_TRACT
  Filled 2018-07-02: qty 3

## 2018-07-02 MED ORDER — IPRATROPIUM-ALBUTEROL 0.5-2.5 (3) MG/3ML IN SOLN
3.0000 mL | Freq: Four times a day (QID) | RESPIRATORY_TRACT | Status: DC
  Administered 2018-07-02 – 2018-07-07 (×18): 3 mL via RESPIRATORY_TRACT
  Filled 2018-07-02 (×19): qty 3

## 2018-07-02 MED ORDER — FUROSEMIDE 10 MG/ML IJ SOLN
12.0000 mg/h | INTRAVENOUS | Status: DC
  Administered 2018-07-02 – 2018-07-03 (×2): 4 mg/h via INTRAVENOUS
  Administered 2018-07-04 – 2018-07-05 (×2): 12 mg/h via INTRAVENOUS
  Filled 2018-07-02 (×6): qty 25

## 2018-07-02 MED ORDER — CHLORHEXIDINE GLUCONATE 0.12 % MT SOLN
15.0000 mL | Freq: Two times a day (BID) | OROMUCOSAL | Status: DC
  Administered 2018-07-02 – 2018-07-03 (×3): 15 mL via OROMUCOSAL
  Filled 2018-07-02: qty 15

## 2018-07-02 MED ORDER — NOREPINEPHRINE 4 MG/250ML-% IV SOLN
0.0000 ug/min | INTRAVENOUS | Status: DC
  Administered 2018-07-02: 2 ug/min via INTRAVENOUS
  Filled 2018-07-02: qty 250

## 2018-07-02 NOTE — Progress Notes (Signed)
NAME:  [name]., MRN:  [id number], DOB:  1931-11-21, LOS: 5 ADMISSION DATE:  06/27/2018, CONSULTATION DATE:  11/23 REFERRING MD:  Dr. [name], CHIEF COMPLAINT:  SOB   Brief History    84 year old male with past medical history as below, which is significant for COPD (on 3 L supplemental oxygen at home), systolic congestive heart failure with recent ejection fraction 25 to 30%, CKD stage III and myelodysplasia.  He was admitted to [facility] on 11/19 with complaints of several days shortness of breath and progressive weakness.  Imaging upon admission was concerning for pneumonia and pleural effusion.  He was admitted for treatment of presumed pneumonia and treated with empiric antibiotics.  Inpatient course became complicated by worsening dyspnea which improved with IV diuretics.  His blood pressure remained borderline so he was difficult to diurese.  On 11/23 cardiology was consulted for further evaluation who felt he had low cardiac output despite hypervolemia.  IV milrinone was started in hopes that he would be able to be diuresed.  He was transferred to ICU for continued shortness of breath requiring BiPAP and the initiation of milrinone infusion.  PCCM was asked to evaluate.   Significant Hospital Events   11/19 admit 11/23 ICU transfer  Consults:  Cardiology 11/23  Procedures:    Significant Diagnostic Tests:    Micro Data:    Antimicrobials:  Cefepime 11/19 > Vanco 11/19 > 11/20  Interim history/subjective:   07/02/2018 - remains on bipap - 30% fio2, On milrinone gtt. On lasix gtt. A bit more awake. Mild intermittent confusion + . Remains NPO due to BiPAP. Wife wants to ensure he can get his cancer medication Revlimid . AKI some beter  Objective   Blood pressure (!) 104/57, pulse 75, temperature 97.7 F (36.5 C), temperature source Oral, resp. rate 19, height 5\' 7"  (1.702 m), weight 93.7 kg, SpO2 92 %. CVP:  [18 mmHg] 18 mmHg      Intake/Output  Summary (Last 24 hours) at 07/02/2018 0757 Last data filed at 07/02/2018 0600 Gross per 24 hour  Intake 992.65 ml  Output 1510 ml  Net -517.35 ml   Filed Weights   06/30/18 0700 07/01/18 0135 07/02/18 0500  Weight: 91.9 kg 93.6 kg 93.7 kg   General Appearance:  Obese on bipoap. Looks better Head:  Normocephalic, without obvious abnormality, atraumatic Eyes:  PERRL - yes, conjunctiva/corneas - clear     Ears:  Normal external ear canals, both ears Nose:  G tube - no.  Throat:  ETT TUBE - no , OG tube - no. HAS FACE BiPAP Neck:  Supple,  No enlargement/tenderness/nodules Lungs: Clear to auscultation bilaterally, Ventilator   Synchrony on BiPAP +- . Heart:  S1 and S2 normal, no murmur, CVP - no.  Pressors - MILRINONE with LASIX gtt Abdomen:  Soft, no masses, no organomegaly Genitalia / Rectal:  Not done Extremities:  Extremities- intact Skin:  ntact in exposed areas . Sacral area - not examined Neurologic:  Sedation - none -> RASS - +1 . Moves all 4s - yes. CAM-ICU - follows some commands . Orientation - partial     LABS    PULMONARY Recent Labs  Lab 07/01/18 1430 07/01/18 1653 07/02/18 0510  PHART 7.247* 7.267*  --   PCO2ART 74.0* 72.6*  --   PO2ART 60.1* 119.0*  --   HCO3 31.1* 33.4*  --   TCO2  --  36*  --   O2SAT 86.2 98.0 63.7  CBC Recent Labs  Lab 06/30/18 0502 07/01/18 0622 07/02/18 0404  HGB 8.4* 8.9* 8.0*  HCT 28.0* 30.1* 26.1*  WBC 4.1 3.9* 3.0*  PLT 92* 85* 74*    COAGULATION No results for input(s): INR in the last 168 hours.  CARDIAC   Recent Labs  Lab 06/27/18 2146 06/28/18 0409 06/28/18 0811  TROPONINI 0.14* 0.10* 0.09*   No results for input(s): PROBNP in the last 168 hours.   CHEMISTRY Recent Labs  Lab 06/27/18 1910  06/29/18 0516 06/30/18 0502 07/01/18 0622 07/01/18 2326 07/02/18 0404  NA 139   < > 137 135 132* 133* 133*  K 4.5   < > 4.9 5.0 6.0* 5.2* 5.3*  CL 99   < > 98 98 95* 97* 95*  CO2 33*   < > 33* 33* 31 29  30   GLUCOSE 91   < > 95 103* 95 76 77  BUN 58*   < > 60* 68* 81* 78* 81*  CREATININE 2.02*   < > 1.90* 1.91* 2.18* 1.92* 1.97*  CALCIUM 8.6*   < > 8.6* 8.5* 8.6* 8.1* 8.6*  MG 2.6*  --   --   --   --   --   --    < > = values in this interval not displayed.   Estimated Creatinine Clearance: 30.4 mL/min (A) (by C-G formula based on SCr of 1.97 mg/dL (H)).   LIVER No results for input(s): AST, ALT, ALKPHOS, BILITOT, PROT, ALBUMIN, INR in the last 168 hours.   INFECTIOUS Recent Labs  Lab 06/27/18 2049  LATICACIDVEN 0.75     ENDOCRINE CBG (last 3)  Recent Labs    07/01/18 2341 07/02/18 0119 07/02/18 0419  GLUCAP 63* 81 64*         IMAGING x48h  - image(s) personally visualized  -   highlighted in bold Dg Chest 1 View  Result Date: 07/01/2018 CLINICAL DATA:  Central line placement EXAM: CHEST  1 VIEW COMPARISON:  07/01/2018 FINDINGS: Right IJ central venous catheter tip is at the cavoatrial junction. Moderate cardiomegaly is unchanged. Left chest wall pacemaker leads are unchanged in position. There is left-greater-than-right basilar opacity, likely atelectasis. Severe osteoarthrosis of the right shoulder. IMPRESSION: Right IJ central venous catheter tip at the cavoatrial junction. Electronically Signed   By: Ulyses Jarred M.D.   On: 07/01/2018 22:56   Dg Chest Port 1 View  Result Date: 07/01/2018 CLINICAL DATA:  Shortness of breath EXAM: PORTABLE CHEST 1 VIEW COMPARISON:  06/27/2018 FINDINGS: Cardiomegaly, pacemaker and mild pulmonary vascular congestion again noted in this low volume film. Bibasilar opacities/atelectasis are unchanged. There may be trace bilateral pleural effusions present. There is no evidence of pneumothorax. IMPRESSION: Unchanged appearance of the chest with cardiomegaly, mild pulmonary vascular congestion, bibasilar opacities/atelectasis and possible trace pleural effusions. Electronically Signed   By: Margarette Canada M.D.   On: 07/01/2018 08:37      Resolved Hospital Problem list     Assessment & Plan:   Acute on chronic hypercarbic respiratory failure: in the setting of PNA, CHF, and decompensated OSA.   07/02/2018 - seems beter on bipap for acute hypercarbic failure - Continue BiPAP but can try 1h breaks during day time - Intubate if needed - Ok for meds per RN direction    COPD without acute exacerbation OSA, noncompliant with CPAP - continue duonebs, PRN albuterol - bi   Shock, presumed cardiogenic +/- septic. In the setting of Acute on chronic HFrEF 25-30% EF. -  baseline SBP in mid to high 90s.    - MAP 79 on 07/02/18 - Milrinone gtt and lasix gtt   HCAP - Has been narrowed to cefepime. Continue for 8 day course.  - Follow cultures  AKI on CKD III - diuresis as tolerated - Follow BMP - hopefully he does not progress to require HD.   Myelodysplasia low grade. Followed by Dr. Marin Olp. Pancytopenia - Holding revlimid while NPO but can challenge 07/02/2018 due to more wakefulness  Atrial fibrillation no longer on anticoagulation due to bleeding risk - per cardiology.  Hyperkalemia - kayexalate   - lasix gtt  Best practice:  Diet: NPO Pain/Anxiety/Delirium protocol (if indicated): n/a VAP protocol (if indicated): n/a DVT prophylaxis: sq heparin GI prophylaxis: protonix Glucose control:  Mobility: Bedrest Code Status: FULL Family Communication: patient, wife, and daughter (nurse) updated at bedside 07/01/18. None at bedside 07/02/2018  Disposition: ICU   ATTESTATION & SIGNATURE   The patient JOURNEY RATTERMAN Sr. is critically ill with multiple organ systems failure and requires high complexity decision making for assessment and support, frequent evaluation and titration of therapies, application of advanced monitoring technologies and extensive interpretation of multiple databases.   Critical Care Time devoted to patient care services described in this note is  30  Minutes. This time reflects  time of care of this signee Dr Brand Males. This critical care time does not reflect procedure time, or teaching time or supervisory time of PA/NP/Med student/Med Resident etc but could involve care discussion time     Dr. Brand Males, M.D., [facility].C.P Pulmonary and Critical Care Medicine Staff Physician [facility] Pager: [phone], If no answer or between  15:00h - 7:00h: call [phone]  07/02/2018 7:58 AM

## 2018-07-02 NOTE — Progress Notes (Signed)
eLink Physician-Brief Progress Note Patient Name: Anthony RUSHING Sr. DOB: [birth year] MRN: [id number]   Date of Service  07/02/2018  HPI/Events of Note  Hypotension in male with pneumonia and acute CHF.  On lasix and milrinone drip.  eICU Interventions  Start norepinephrine drip titrate to goal map 65     Intervention Category Intermediate Interventions: Hypotension - evaluation and management  Mauri Brooklyn, P 07/02/2018, 9:28 PM

## 2018-07-02 NOTE — Progress Notes (Signed)
PROGRESS NOTE  [name] LPF:[id number] DOB: November 30, 1931 DOA: 06/27/2018 PCP: [name], MD   LOS: 5 days   Brief Narrative / Interim history: 84 year old male with history of COPD with chronic hypoxic respiratory failure on supplemental oxygen with 3 L at home, chronic systolic CHF with most recent EF 25-30% in June 2019, chronic kidney disease stage III with baseline creatinine around 1.3, pancytopenia due to MDS, admitted to the hospital on 11/19 after several days of progressive weakness and shortness of breath.  He denies any chest pain or abdominal pain.  Denies any fever or chills.  Reports a dry cough.  Family on admission reported that his O2 sats at home was in the 60-70s on home 3 L.  Chest x-ray on admission showed small left pleural effusion with consolidation, suspicious for infiltrate.  He was placed on fact cefepime and was admitted to the hospital with leading diagnosis of pneumonia.  Subjective: Patient noted to be more alert, awake, oriented this morning, still with significant work of breathing, pursed lip breathing.  Denies any chest pain, abdominal pain, nausea/vomiting, fever/chills.  Assessment & Plan: Principal Problem:   PNA (pneumonia) Active Problems:   Nonischemic cardiomyopathy - coronaries by angiography 5329   Chronic systolic heart failure (HCC)   Chronic atrial fibrillation   Acute on chronic respiratory failure with hypoxia (HCC)   Acute renal failure superimposed on stage 3 chronic kidney disease (HCC)   Biventricular cardiac pacemaker  st Judes  dual chamber with LV in atrial port    Acute on chronic hypoxic/hypercarbic respiratory failure, multifactorial due to lobar pneumonia Vs decompensated OSA with respiratory acidosis Vs pulmonary hypertension Currently requiring BiPAP, as well as supplemental oxygen in between [facility] x2 negative Chest x-ray showed consolidation in left base Continue cefepime, duo nebs Continue supplemental  oxygen, BiPAP PCCM on board Continue close observation in ICU  Cardiogenic shock/acute on chronic systolic CHF /nonischemic cardiomyopathy Noted to be overloaded  Echo on 01/2018 showed EF of 25 to 92%, LV diastolic function was not determined Consulted cardiology, appreciate recommendations: Lasix drip, milrinone HF team will see in am  Hyperkalemia Resolved  Chronic atrial fibrillation With presence of biventricular pacer currently, not on anticoagulation due to recurrent GI bleed history Rhythm is paced  Acute kidney injury on chronic kidney disease stage III Worsening Baseline creatinine around 1.3-1.6, 2.0 on admission and improved at 1.84, now 1.97 May need nephrology consult if not improving BMP in a.m.  Pancytopenia due to myelodysplastic syndrome Labs overall stable, no need for transfusions Continue lenalidomide Daily CBC  BPH -Continue home medications  OSA on CPAP -Continue nightly CPAP  GOC Palliative was consulted during his prior hospital stay remains full code and continue current management   Scheduled Meds: . chlorhexidine  15 mL Mouth Rinse BID  . heparin injection (subcutaneous)  5,000 Units Subcutaneous Q8H  . ipratropium-albuterol  3 mL Nebulization Q6H  . lenalidomide  5 mg Oral QHS  . mouth rinse  15 mL Mouth Rinse q12n4p  . pantoprazole  40 mg Oral Daily  . rOPINIRole  4 mg Oral QHS  . sodium chloride flush  3 mL Intravenous Q12H  . sodium polystyrene  30 g Oral Once   Continuous Infusions: . sodium chloride Stopped (07/01/18 1517)  . ceFEPime (MAXIPIME) IV Stopped (07/02/18 0159)  . dextrose 25 mL/hr at 07/02/18 0701  . furosemide (LASIX) infusion 8 mg/hr (07/02/18 1144)  . milrinone 0.25 mcg/kg/min (07/02/18 0600)   PRN Meds:.sodium chloride,  albuterol, alum & mag hydroxide-simeth, loratadine, sodium chloride flush  DVT prophylaxis: Heparin Code Status: Full code Family Communication: None at bedside Disposition Plan: To be  determined  Consultants:   Cardiology  PCCM  Procedures:   None   Antimicrobials:  Vancomycin 11/19 >> 11/21  Cefepime 11/19 >>  Objective: Vitals:   07/02/18 1635 07/02/18 1637 07/02/18 1645 07/02/18 1700  BP:      Pulse: 73 73 73 72  Resp: 18 17 18 17   Temp:      TempSrc:      SpO2: (!) 84% 94% 95% 96%  Weight:      Height:        Intake/Output Summary (Last 24 hours) at 07/02/2018 1709 Last data filed at 07/02/2018 1700 Gross per 24 hour  Intake 652.87 ml  Output 2310 ml  Net -1657.13 ml   Filed Weights   06/30/18 0700 07/01/18 0135 07/02/18 0500  Weight: 91.9 kg 93.6 kg 93.7 kg    Examination:  General:  Mild distress, pursed lip breathing, increased work of breathing  Cardiovascular: S1, S2 present  Respiratory:  Basilar crackles  Abdomen: Soft, nontender, nondistended, bowel sounds present  Musculoskeletal: No bilateral pedal edema noted  Skin: Normal  Psychiatry:  Normal mood    Data Reviewed: I have independently reviewed following labs and imaging studies    CBC: Recent Labs  Lab 2018/07/26 0409 06/29/18 0841 06/30/18 0502 07/01/18 0622 07/02/18 0404  WBC 3.0* 4.2 4.1 3.9* 3.0*  NEUTROABS 2.2  --   --  2.9 1.9  HGB 8.2* 9.1* 8.4* 8.9* 8.0*  HCT 26.7* 30.4* 28.0* 30.1* 26.1*  MCV 117.6* 120.6* 121.7* 119.9* 117.0*  PLT 95* 90* 92* 85* 74*   Basic Metabolic Panel: Recent Labs  Lab 06/27/18 1910  06/29/18 0516 06/30/18 0502 07/01/18 0622 07/01/18 2326 07/02/18 0404  NA 139   < > 137 135 132* 133* 133*  K 4.5   < > 4.9 5.0 6.0* 5.2* 5.3*  CL 99   < > 98 98 95* 97* 95*  CO2 33*   < > 33* 33* 31 29 30   GLUCOSE 91   < > 95 103* 95 76 77  BUN 58*   < > 60* 68* 81* 78* 81*  CREATININE 2.02*   < > 1.90* 1.91* 2.18* 1.92* 1.97*  CALCIUM 8.6*   < > 8.6* 8.5* 8.6* 8.1* 8.6*  MG 2.6*  --   --   --   --   --   --    < > = values in this interval not displayed.   GFR: Estimated Creatinine Clearance: 30.4 mL/min (A) (by C-G  formula based on SCr of 1.97 mg/dL (H)). Liver Function Tests: No results for input(s): AST, ALT, ALKPHOS, BILITOT, PROT, ALBUMIN in the last 168 hours. No results for input(s): LIPASE, AMYLASE in the last 168 hours. No results for input(s): AMMONIA in the last 168 hours. Coagulation Profile: No results for input(s): INR, PROTIME in the last 168 hours. Cardiac Enzymes: Recent Labs  Lab 06/27/18 2146 Jul 26, 2018 0409 07-26-2018 0811  TROPONINI 0.14* 0.10* 0.09*   BNP (last 3 results) No results for input(s): PROBNP in the last 8760 hours. HbA1C: No results for input(s): HGBA1C in the last 72 hours. CBG: Recent Labs  Lab 07/02/18 0119 07/02/18 0419 07/02/18 0824 07/02/18 1156 07/02/18 1558  GLUCAP 81 64* 85 106* 107*   Lipid Profile: No results for input(s): CHOL, HDL, LDLCALC, TRIG, CHOLHDL, LDLDIRECT in the last 72  hours. Thyroid Function Tests: No results for input(s): TSH, T4TOTAL, FREET4, T3FREE, THYROIDAB in the last 72 hours. Anemia Panel: No results for input(s): VITAMINB12, FOLATE, FERRITIN, TIBC, IRON, RETICCTPCT in the last 72 hours. Urine analysis:    Component Value Date/Time   COLORURINE YELLOW 05/05/2018 2247   APPEARANCEUR CLEAR 05/05/2018 2247   LABSPEC 1.008 05/05/2018 2247   PHURINE 5.0 05/05/2018 [place] 05/05/2018 2247   [place] NEGATIVE 05/05/2018 2247   [place] NEGATIVE 05/05/2018 [place] 05/05/2018 2247   PROTEINUR NEGATIVE 05/05/2018 2247   NITRITE NEGATIVE 05/05/2018 2247   LEUKOCYTESUR NEGATIVE 05/05/2018 2247   Sepsis Labs: Invalid input(s): PROCALCITONIN, LACTICIDVEN  Recent Results (from the past 240 hour(s))  Culture, blood (routine x 2) Call MD if unable to obtain prior to antibiotics being given     Status: None   Collection Time: 06/27/18  9:50 PM  Result Value Ref Range Status   Specimen Description BLOOD RIGHT HAND  Final   Special Requests   Final    BOTTLES DRAWN AEROBIC ONLY Blood Culture  adequate volume   Culture   Final    NO GROWTH 5 DAYS Performed at [facility], [address] [place], [place] [address]    Report Status 07/02/2018 FINAL  Final  Culture, blood (routine x 2) Call MD if unable to obtain prior to antibiotics being given     Status: None   Collection Time: 06/27/18  9:55 PM  Result Value Ref Range Status   Specimen Description BLOOD RIGHT HAND  Final   Special Requests   Final    BOTTLES DRAWN AEROBIC ONLY Blood Culture adequate volume   Culture   Final    NO GROWTH 5 DAYS Performed at [facility], [place] [address], [place], [place] [address]    Report Status 07/02/2018 FINAL  Final  MRSA PCR Screening     Status: None   Collection Time: 06/27/18 11:10 PM  Result Value Ref Range Status   MRSA by PCR NEGATIVE NEGATIVE Final    Comment:        The GeneXpert MRSA Assay (FDA approved for NASAL specimens only), is one component of a comprehensive MRSA colonization surveillance program. It is not intended to diagnose MRSA infection nor to guide or monitor treatment for MRSA infections. Performed at [facility], [place] [address] [place], [place] [address]       Radiology Studies: Dg Chest 1 View  Result Date: 07/01/2018 CLINICAL DATA:  Central line placement EXAM: CHEST  1 VIEW COMPARISON:  07/01/2018 FINDINGS: Right IJ central venous catheter tip is at the cavoatrial junction. Moderate cardiomegaly is unchanged. Left chest wall pacemaker leads are unchanged in position. There is left-greater-than-right basilar opacity, likely atelectasis. Severe osteoarthrosis of the right shoulder. IMPRESSION: Right IJ central venous catheter tip at the cavoatrial junction. Electronically Signed   By: Ulyses Jarred M.D.   On: 07/01/2018 22:56   Dg Chest Port 1 View  Result Date: 07/01/2018 CLINICAL DATA:  Shortness of breath EXAM: PORTABLE CHEST 1 VIEW COMPARISON:  06/27/2018 FINDINGS: Cardiomegaly, pacemaker and mild pulmonary vascular  congestion again noted in this low volume film. Bibasilar opacities/atelectasis are unchanged. There may be trace bilateral pleural effusions present. There is no evidence of pneumothorax. IMPRESSION: Unchanged appearance of the chest with cardiomegaly, mild pulmonary vascular congestion, bibasilar opacities/atelectasis and possible trace pleural effusions. Electronically Signed   By: Margarette Canada M.D.   On: 07/01/2018  08:37     Alma Friendly, MD [facility]  If 7PM-7AM, please contact night-coverage [email or web address]  07/02/2018, 5:09 PM

## 2018-07-02 NOTE — Progress Notes (Addendum)
Bladder scan showing 377, pt retaining. Spoke w/ CCM MD, verbal order for foley

## 2018-07-02 NOTE — Progress Notes (Signed)
Patient midnight CBG was 63. RN notified Elink and gave an amp of D50. CBG is 81 after the amp.

## 2018-07-02 NOTE — Progress Notes (Signed)
Progress Note  Patient Name: Anthony MERCADO Sr. Date of Encounter: 07/02/2018  Primary Cardiologist: [name], MD   Subjective   More alert, oriented. Still struggling a bit (off BiPAP for almost 2 hours, plan to rest on BiPAP intermittently). Pursed lip breathing, sat 95%. SBP in 90s. Co-ox 63.7%. Fair diuresis 1500 mL, net negative 470 mL.  Creat unchanged at 1.9. Had mild hypoglycemia, so on D10W drip.  Inpatient Medications    Scheduled Meds: . chlorhexidine  15 mL Mouth Rinse BID  . heparin injection (subcutaneous)  5,000 Units Subcutaneous Q8H  . ipratropium-albuterol  3 mL Nebulization Q6H  . lenalidomide  5 mg Oral QHS  . mouth rinse  15 mL Mouth Rinse q12n4p  . pantoprazole  40 mg Oral Daily  . rOPINIRole  4 mg Oral QHS  . sodium chloride flush  3 mL Intravenous Q12H  . sodium polystyrene  30 g Oral Once   Continuous Infusions: . sodium chloride Stopped (07/01/18 1517)  . ceFEPime (MAXIPIME) IV Stopped (07/02/18 0159)  . dextrose 25 mL/hr at 07/02/18 0701  . furosemide (LASIX) infusion 4 mg/hr (07/02/18 0707)  . milrinone 0.25 mcg/kg/min (07/02/18 0600)   PRN Meds: sodium chloride, albuterol, alum & mag hydroxide-simeth, loratadine, sodium chloride flush   Vital Signs    Vitals:   07/02/18 1030 07/02/18 1045 07/02/18 1100 07/02/18 1115  BP: (!) 99/54  (!) 97/52   Pulse: 74 72 73 75  Resp: 19 18 19  (!) 21  Temp:      TempSrc:      SpO2: 95% 96% 92% 96%  Weight:      Height:        Intake/Output Summary (Last 24 hours) at 07/02/2018 1131 Last data filed at 07/02/2018 1100 Gross per 24 hour  Intake 982.18 ml  Output 1560 ml  Net -577.82 ml   Filed Weights   06/30/18 0700 07/01/18 0135 07/02/18 0500  Weight: 91.9 kg 93.6 kg 93.7 kg    Telemetry    AFib, 100% BiV paced - Personally Reviewed  ECG    No new tracing - Personally Reviewed  Physical Exam  Appears weak, frail, pursed lip breathing GEN: No acute distress.   Neck:  Hard to evaluate JVD, but I think elevated to the angle of the jaw, central line on R Cardiac: RRR, no murmurs, rubs, or gallops.  Respiratory: severely diminished bilaterally, no wheezes or rales. GI: Soft, nontender, non-distended  MS: No edema; No deformity. Neuro:  Nonfocal  Psych: Normal affect   Labs    Chemistry Recent Labs  Lab 07/01/18 0622 07/01/18 2326 07/02/18 0404  NA 132* 133* 133*  K 6.0* 5.2* 5.3*  CL 95* 97* 95*  CO2 31 29 30   GLUCOSE 95 76 77  BUN 81* 78* 81*  CREATININE 2.18* 1.92* 1.97*  CALCIUM 8.6* 8.1* 8.6*  GFRNONAA 26* 30* 29*  GFRAA 30* 35* 34*  ANIONGAP 6 7 8      Hematology Recent Labs  Lab 06/30/18 0502 07/01/18 0622 07/02/18 0404  WBC 4.1 3.9* 3.0*  RBC 2.30* 2.51* 2.23*  HGB 8.4* 8.9* 8.0*  HCT 28.0* 30.1* 26.1*  MCV 121.7* 119.9* 117.0*  MCH 36.5* 35.5* 35.9*  MCHC 30.0 29.6* 30.7  RDW 19.5* 19.4* 19.3*  PLT 92* 85* 74*    Cardiac Enzymes Recent Labs  Lab 06/27/18 2146 06/28/18 0409 06/28/18 0811  TROPONINI 0.14* 0.10* 0.09*    Recent Labs  Lab 06/27/18 1915  TROPIPOC 0.10*  BNP Recent Labs  Lab 06/27/18 1910  BNP 1,678.1*     DDimer No results for input(s): DDIMER in the last 168 hours.   Radiology    Dg Chest 1 View  Result Date: 07/01/2018 CLINICAL DATA:  Central line placement EXAM: CHEST  1 VIEW COMPARISON:  07/01/2018 FINDINGS: Right IJ central venous catheter tip is at the cavoatrial junction. Moderate cardiomegaly is unchanged. Left chest wall pacemaker leads are unchanged in position. There is left-greater-than-right basilar opacity, likely atelectasis. Severe osteoarthrosis of the right shoulder. IMPRESSION: Right IJ central venous catheter tip at the cavoatrial junction. Electronically Signed   By: Ulyses Jarred M.D.   On: 07/01/2018 22:56   Dg Chest Port 1 View  Result Date: 07/01/2018 CLINICAL DATA:  Shortness of breath EXAM: PORTABLE CHEST 1 VIEW COMPARISON:  06/27/2018 FINDINGS:  Cardiomegaly, pacemaker and mild pulmonary vascular congestion again noted in this low volume film. Bibasilar opacities/atelectasis are unchanged. There may be trace bilateral pleural effusions present. There is no evidence of pneumothorax. IMPRESSION: Unchanged appearance of the chest with cardiomegaly, mild pulmonary vascular congestion, bibasilar opacities/atelectasis and possible trace pleural effusions. Electronically Signed   By: Margarette Canada M.D.   On: 07/01/2018 08:37    Cardiac Studies   01/13/2018 ECHO - Left ventricle: The cavity size was normal. Systolic function was   severely reduced. The estimated ejection fraction was in the   range of 25% to 30%. There is akinesis of the   mid-apicalanteroseptal and inferoseptal myocardium. The study is   not technically sufficient to allow evaluation of LV diastolic   function. - Aortic valve: Trileaflet; moderately thickened, moderately   calcified leaflets. Transvalvular velocity was minimally   increased. There was no stenosis. There was mild regurgitation.   Peak velocity (S): 233 cm/s. Valve area (VTI): 1.49 cm^2. Valve   area (Vmax): 1.6 cm^2. Valve area (Vmean): 1.44 cm^2. - Mitral valve: There was mild regurgitation. - Left atrium: The atrium was severely dilated. - Right ventricle: The cavity size was moderately dilated. Wall   thickness was normal. Pacer wire or catheter noted in right   ventricle. - Right atrium: The atrium was severely dilated. - Tricuspid valve: There was moderate regurgitation. - Pulmonary arteries: Systolic pressure was moderately increased.   PA peak pressure: 53 mm Hg (S).  Patient Profile     84 y.o. male with acute decompensation of severe biventricular failure, acute on chronic hypoxic/hypercapnic respiratory failure, moderate PAH, COPD, acute on chronic CKD stage 3, CRT-P, permanent atrial fibrillation  Assessment & Plan    1. CHF/cardiogenic shock: hypervolemic, decent CO on milrinone, but  mediocre response to diuresis. Increase furosemide drip. Keep on milrinone IV for pulmonary vasodilation and inotropic effect. Have asked CHF service to see tomorrow. Normal coronaries at cath 2010. 2. Ac/chr resp failure: baseline pCO2 in the 50s, was 75 yesterday with respiratory acidosis. Much better today, even after being off BiPAP for 1-2 hours, but needs a rest. Hopefully will avoid intubation. History of noncompliance with OSA. 3. CRT-P:  Normal BIV pacing seen on monitor/ECG. 4. AFib: background permanent rhythm is AF with slow ventricular response. Not anticoagulated due to recurrent bleeding (gastroduodenal angiodysplasia). 5. Ac/chr renal insuff: baseline creat 1.3, peaked at 2.18, slightly improved 1.9. 6. Myelodysplasia: mild-moderate pancytopenia, macrocytosis. Prescribed Revlimid/Aranesp (Dr. Marin Olp), but I am not sure that he is receiving that.  CRITICAL CARE Performed by: Dani Gobble Yerachmiel Spinney   Total critical care time: 45 minutes  Critical care time was exclusive of separately  billable procedures and treating other patients.  Critical care was necessary to treat or prevent imminent or life-threatening deterioration.  Critical care was time spent personally by me on the following activities: development of treatment plan with patient and/or surrogate as well as nursing, discussions with consultants, evaluation of patient's response to treatment, examination of patient, obtaining history from patient or surrogate, ordering and performing treatments and interventions, ordering and review of laboratory studies, ordering and review of radiographic studies, pulse oximetry and re-evaluation of patient's condition.     For questions or updates, please contact [place] Please consult [email or web address] for contact info under        Signed, [name], MD  07/02/2018, 11:31 AM

## 2018-07-02 NOTE — Progress Notes (Addendum)
Spoke with Palma Holter, RN concerning PICC placement. Instructed her to speak with primary and patient to see if they are seen by nephrology. Patient has CKD III and has a creatine clearance of 25, where a creatine clearance of 30 or less is the recommended limit for PICC placement. It was indicated in the order that the patient doesn't have CKD III or higher. Also, I instructed Rekha, RN to also clarify with primary to ensure order was appropriate.

## 2018-07-02 NOTE — Progress Notes (Signed)
PCCM INTERVAL PROGRESS NOTE  Continues to protect airway. After CVL placed his BP improved and we were able to restart milrinone. Have ordered levophed infusion to prevent milrinone from being held again tonight. Now BP maintianing without pressors and on milrinone. Will start lasix infusion as suggested by cardiology.   Also had episode of hypoglycemia overnight and was started on D5. Will transition this to D10 to limit volume in.    Georgann Housekeeper, AGACNP-BC [place] Pager [phone] or [phone]  07/02/2018 6:10 AM

## 2018-07-03 ENCOUNTER — Encounter: Payer: Medicare Other | Admitting: Podiatry

## 2018-07-03 DIAGNOSIS — J9622 Acute and chronic respiratory failure with hypercapnia: Secondary | ICD-10-CM

## 2018-07-03 LAB — CBC WITH DIFFERENTIAL/PLATELET
ABS IMMATURE GRANULOCYTES: 0.01 10*3/uL (ref 0.00–0.07)
Basophils Absolute: 0 10*3/uL (ref 0.0–0.1)
Basophils Relative: 1 %
Eosinophils Absolute: 0.1 10*3/uL (ref 0.0–0.5)
Eosinophils Relative: 2 %
HEMATOCRIT: 25.4 % — AB (ref 39.0–52.0)
HEMOGLOBIN: 7.8 g/dL — AB (ref 13.0–17.0)
IMMATURE GRANULOCYTES: 0 %
LYMPHS ABS: 0.6 10*3/uL — AB (ref 0.7–4.0)
LYMPHS PCT: 19 %
MCH: 35.9 pg — ABNORMAL HIGH (ref 26.0–34.0)
MCHC: 30.7 g/dL (ref 30.0–36.0)
MCV: 117.1 fL — AB (ref 80.0–100.0)
MONOS PCT: 14 %
Monocytes Absolute: 0.5 10*3/uL (ref 0.1–1.0)
NEUTROS PCT: 64 %
Neutro Abs: 2.2 10*3/uL (ref 1.7–7.7)
PLATELETS: 72 10*3/uL — AB (ref 150–400)
RBC: 2.17 MIL/uL — ABNORMAL LOW (ref 4.22–5.81)
RDW: 19.5 % — AB (ref 11.5–15.5)
WBC: 3.4 10*3/uL — ABNORMAL LOW (ref 4.0–10.5)
nRBC: 0 % (ref 0.0–0.2)

## 2018-07-03 LAB — PHOSPHORUS: PHOSPHORUS: 4.1 mg/dL (ref 2.5–4.6)

## 2018-07-03 LAB — BASIC METABOLIC PANEL
Anion gap: 5 (ref 5–15)
BUN: 80 mg/dL — AB (ref 8–23)
CO2: 33 mmol/L — AB (ref 22–32)
Calcium: 8.6 mg/dL — ABNORMAL LOW (ref 8.9–10.3)
Chloride: 98 mmol/L (ref 98–111)
Creatinine, Ser: 1.86 mg/dL — ABNORMAL HIGH (ref 0.61–1.24)
GFR calc Af Amer: 37 mL/min — ABNORMAL LOW (ref >60)
GFR calc non Af Amer: 32 mL/min — ABNORMAL LOW (ref >60)
Glucose, Bld: 101 mg/dL — ABNORMAL HIGH (ref 70–99)
POTASSIUM: 4.5 mmol/L (ref 3.5–5.1)
SODIUM: 136 mmol/L (ref 135–145)

## 2018-07-03 LAB — GLUCOSE, CAPILLARY
GLUCOSE-CAPILLARY: 111 mg/dL — AB (ref 70–99)
GLUCOSE-CAPILLARY: 87 mg/dL (ref 70–99)
GLUCOSE-CAPILLARY: 87 mg/dL (ref 70–99)
GLUCOSE-CAPILLARY: 92 mg/dL (ref 70–99)
Glucose-Capillary: 128 mg/dL — ABNORMAL HIGH (ref 70–99)
Glucose-Capillary: 86 mg/dL (ref 70–99)
Glucose-Capillary: 95 mg/dL (ref 70–99)

## 2018-07-03 LAB — COOXEMETRY PANEL
CARBOXYHEMOGLOBIN: 1.9 % — AB (ref 0.5–1.5)
Methemoglobin: 2 % — ABNORMAL HIGH (ref 0.0–1.5)
O2 Saturation: 74.9 %
TOTAL HEMOGLOBIN: 8.4 g/dL — AB (ref 12.0–16.0)

## 2018-07-03 LAB — MAGNESIUM: Magnesium: 2.8 mg/dL — ABNORMAL HIGH (ref 1.7–2.4)

## 2018-07-03 MED ORDER — ESCITALOPRAM OXALATE 10 MG PO TABS
5.0000 mg | ORAL_TABLET | Freq: Every day | ORAL | Status: DC
  Administered 2018-07-03 – 2018-07-09 (×7): 5 mg via ORAL
  Filled 2018-07-03 (×7): qty 1

## 2018-07-03 MED ORDER — SODIUM CHLORIDE 0.9 % IV BOLUS: 250.0000 mL | Freq: Once | INTRAVENOUS | Status: DC

## 2018-07-03 MED ORDER — CHLORHEXIDINE GLUCONATE 0.12 % MT SOLN
15.0000 mL | Freq: Two times a day (BID) | OROMUCOSAL | Status: DC
  Administered 2018-07-03 – 2018-07-08 (×9): 15 mL via OROMUCOSAL
  Filled 2018-07-03 (×10): qty 15

## 2018-07-03 MED ORDER — FUROSEMIDE 10 MG/ML IJ SOLN
80.0000 mg | Freq: Once | INTRAMUSCULAR | Status: AC
  Administered 2018-07-03: 80 mg via INTRAVENOUS
  Filled 2018-07-03: qty 8

## 2018-07-03 MED ORDER — DOCUSATE SODIUM 100 MG PO CAPS
100.0000 mg | ORAL_CAPSULE | Freq: Every day | ORAL | Status: DC
  Administered 2018-07-03 – 2018-07-09 (×7): 100 mg via ORAL
  Filled 2018-07-03 (×7): qty 1

## 2018-07-03 MED ORDER — SALINE SPRAY 0.65 % NA SOLN
1.0000 | NASAL | Status: DC | PRN
  Administered 2018-07-03: 1 via NASAL
  Filled 2018-07-03: qty 44

## 2018-07-03 MED ORDER — ORAL CARE MOUTH RINSE
15.0000 mL | Freq: Two times a day (BID) | OROMUCOSAL | Status: DC
  Administered 2018-07-03 – 2018-07-08 (×8): 15 mL via OROMUCOSAL

## 2018-07-03 NOTE — Care Management Note (Signed)
Case Management Note  Patient Details  Name: Anthony WEEKES Sr. MRN: [id number] Date of Birth: 27-Mar-1932  Subjective/Objective:               Respiratory distress, hypoxic, bipap, pneumonia   Action/Plan: PTA home with spouse. Active with [facility] for [facility] RN. Baseline home O2-3 l/m. Will follow for transition of care needs. If patient able to return home, will need resumption of home health services.   Expected Discharge Date:                  Expected Discharge Plan:  [place]  In-House Referral:     Discharge planning Services  CM Consult  Post Acute Care Choice:  Resumption of Svcs/PTA Provider Choice offered to:  Patient, Spouse  DME Arranged:    DME Agency:     HH Arranged:  RN [place] Agency:  [place]  Status of Service:  In process, will continue to follow  If discussed at Long Length of Stay Meetings, dates discussed:    Additional Comments:  Bartholomew Crews, RN 07/03/2018, 12:40 PM

## 2018-07-03 NOTE — Progress Notes (Deleted)
eLink Physician-Brief Progress Note Patient Name: Anthony EKSTEIN Sr. DOB: 08-09-1932 MRN: [id number]   Date of Service  07/03/2018  HPI/Events of Note  Lactic Acid = 6.4 --> 5.5. LVEF = 25-30% and CVP = 5.   eICU Interventions  Will order: 1. Bolus with 0.9 NaCl 250 mL IV over 30 minutes now.  2. Monitor CVP now and Q 4 hours.     Intervention Category Major Interventions: Acid-Base disturbance - evaluation and management  Tanekia Ryans [place] 07/03/2018, 11:43 PM

## 2018-07-03 NOTE — Progress Notes (Signed)
Physical Therapy Treatment Patient Details Name: [name]. MRN: [id number] DOB: 06/01/1932 Today's Date: 07/03/2018    History of Present Illness Pt is an 84 y.o. male admitted 06/27/18 after progressive weakness and SOB. Worked up for acute on chronic hypoxic respiratory failure, multifactorial due to lobar PNA and CHF. PMH includes CKD III, a-fib, COPD (on 3L home O2), OSA on CPAP, CHF, CAD.    PT Comments    Limited session today due to pt still dyspneic at rest, though SpO2 now up at appropriate levels to get OOB.  Follow Up Recommendations  Home health PT;Supervision - Intermittent     Equipment Recommendations  None recommended by PT    Recommendations for Other Services       Precautions / Restrictions Precautions Precautions: Fall    Mobility  Bed Mobility Overal bed mobility: Needs Assistance Bed Mobility: Supine to Sit     Supine to sit: Min assist     General bed mobility comments: pt mildly dyspneic and not moving as well as last session.  Transfers Overall transfer level: Needs assistance   Transfers: Sit to/from Stand;Stand Pivot Transfers Sit to Stand: Min guard Stand pivot transfers: Min guard       General transfer comment: no assist, but reaching for IV pole  Ambulation/Gait Ambulation/Gait assistance: Min assist Gait Distance (Feet): 8 Feet(forward and back, limited by lines and dyspnea) Assistive device: IV Pole Gait Pattern/deviations: Step-through pattern         Stairs             Wheelchair Mobility    Modified Rankin (Stroke Patients Only)       Balance Overall balance assessment: Needs assistance   Sitting balance-Leahy Scale: Good       Standing balance-Leahy Scale: Fair Standing balance comment: Able to static stand without UE support, but reaching for stable objects for support furing transfer.                            Cognition Arousal/Alertness: Awake/alert Behavior During  Therapy: WFL for tasks assessed/performed Overall Cognitive Status: History of cognitive impairments - at baseline                                 General Comments: NT formally      Exercises  bil Hip/Knee flexion/ext with resisted gross extension x10    General Comments General comments (skin integrity, edema, etc.): sats maintained at 96% on 3L [place], but pt dyspneic and concentrating on pursed lipped breathing.      Pertinent Vitals/Pain Pain Assessment: No/denies pain    Home Living                      Prior Function            PT Goals (current goals can now be found in the care plan section) Acute Rehab PT Goals Patient Stated Goal: Return home to wife as soon as possible PT Goal Formulation: With patient Time For Goal Achievement: 07/12/18 Potential to Achieve Goals: Good Progress towards PT goals: Progressing toward goals    Frequency    Min 3X/week      PT Plan Current plan remains appropriate    Co-evaluation              AM-PAC PT "6 Clicks" Mobility   Outcome Measure  Help  needed turning from your back to your side while in a flat bed without using bedrails?: A Little Help needed moving from lying on your back to sitting on the side of a flat bed without using bedrails?: A Little Help needed moving to and from a bed to a chair (including a wheelchair)?: None Help needed standing up from a chair using your arms (e.g., wheelchair or bedside chair)?: None Help needed to walk in hospital room?: None Help needed climbing 3-5 steps with a railing? : A Little 6 Click Score: 21    End of Session Equipment Utilized During Treatment: Oxygen Activity Tolerance: Patient tolerated treatment well Patient left: in chair;with call bell/phone within reach;with chair alarm set Nurse Communication: Mobility status PT Visit Diagnosis: Other abnormalities of gait and mobility (R26.89)     Time: 1422-1440 PT Time Calculation (min)  (ACUTE ONLY): 18 min  Charges:  $Therapeutic Activity: 8-22 mins                     07/03/2018  Donnella Sham, PT Acute Rehabilitation Services [phone]  (pager) [phone]  (office)   Tessie Fass Amoni Scallan 07/03/2018, 3:08 PM

## 2018-07-03 NOTE — Progress Notes (Signed)
NAME:  [name]., MRN:  [id number], DOB:  1932-01-13, LOS: 6 ADMISSION DATE:  06/27/2018, CONSULTATION DATE:  11/23 REFERRING MD:  Dr. [name], CHIEF COMPLAINT:  SOB   Brief History    84 year old male with past medical history as below, which is significant for COPD (on 3 L supplemental oxygen at home), systolic congestive heart failure with recent ejection fraction 25 to 30%, CKD stage III and myelodysplasia.  He was admitted to [facility] on 11/19 with complaints of several days shortness of breath and progressive weakness.  Imaging upon admission was concerning for pneumonia and pleural effusion.  He was admitted for treatment of presumed pneumonia and treated with empiric antibiotics.  Inpatient course became complicated by worsening dyspnea which improved with IV diuretics.  His blood pressure remained borderline so he was difficult to diurese.  On 11/23 cardiology was consulted for further evaluation who felt he had low cardiac output despite hypervolemia.  IV milrinone was started in hopes that he would be able to be diuresed.  He was transferred to ICU for continued shortness of breath requiring BiPAP and the initiation of milrinone infusion.  PCCM was asked to evaluate.   Significant Hospital Events   11/19 admit 11/23 ICU transfer  Consults:  Cardiology 11/23  Procedures:    Significant Diagnostic Tests:    Micro Data:    Antimicrobials:  Cefepime 11/19 > Vanco 11/19 > 11/20  Interim history/subjective:   Off bipap and down to 4Lpm. Denies pain. Improved dyspnea.  Required initiation of vasopressors overnight.   Objective   Blood pressure (!) 117/58, pulse 76, temperature 97.8 F (36.6 C), temperature source Axillary, resp. rate 19, height 5\' 7"  (1.702 m), weight 91.6 kg, SpO2 100 %. CVP:  [10 mmHg-15 mmHg] 10 mmHg  FiO2 (%):  [30 %] 30 %   Intake/Output Summary (Last 24 hours) at 07/03/2018 2027 Last data filed at 07/03/2018 2000 Gross per 24  hour  Intake 1905.04 ml  Output 2555 ml  Net -649.96 ml   Filed Weights   07/01/18 0135 07/02/18 0500 07/03/18 0500  Weight: 93.6 kg 93.7 kg 91.6 kg   General Appearance:  Obese on bipoap. Looks better Head:  Normocephalic, without obvious abnormality, atraumatic Eyes:  PERRL - yes, conjunctiva/corneas - clear     Ears:  Normal external ear canals, both ears Nose:  G tube - no.  Throat:  ETT TUBE - no , OG tube - no. HAS FACE BiPAP Neck:  Supple,  No enlargement/tenderness/nodules Lungs: Clear to auscultation bilaterally . Heart:  S1 and S2 normal, no murmur, CVP - not visible.  Pressors - MILRINONE with LASIX gtt.  CVP 11. Abdomen:  Soft, no masses, no organomegaly Genitalia / Rectal:  Not done Extremities:  Extremities- intact Skin:  ntact in exposed areas . Sacral area - not examined Neurologic:  Sedation - none -> RASS - +1 . Moves all 4s - yes. CAM-ICU - follows some commands . Orientation - partial     LABS    PULMONARY Recent Labs  Lab 07/01/18 1430 07/01/18 1653 07/02/18 0510 07/02/18 1138 07/03/18 0512  PHART 7.247* 7.267*  --  7.399  --   PCO2ART 74.0* 72.6*  --  50.8*  --   PO2ART 60.1* 119.0*  --  65.0*  --   HCO3 31.1* 33.4*  --  31.6*  --   TCO2  --  36*  --  33*  --   O2SAT 86.2 98.0 63.7 93.0 74.9  CBC Recent Labs  Lab 07/01/18 0622 07/02/18 0404 07/03/18 0440  HGB 8.9* 8.0* 7.8*  HCT 30.1* 26.1* 25.4*  WBC 3.9* 3.0* 3.4*  PLT 85* 74* 72*    COAGULATION No results for input(s): INR in the last 168 hours.  CARDIAC   Recent Labs  Lab 06/27/18 2146 06/28/18 0409 06/28/18 0811  TROPONINI 0.14* 0.10* 0.09*   No results for input(s): PROBNP in the last 168 hours.   CHEMISTRY Recent Labs  Lab 06/27/18 1910  06/30/18 0502 07/01/18 0622 07/01/18 2326 07/02/18 0404 07/03/18 0440  NA 139   < > 135 132* 133* 133* 136  K 4.5   < > 5.0 6.0* 5.2* 5.3* 4.5  CL 99   < > 98 95* 97* 95* 98  CO2 33*   < > 33* 31 29 30  33*  GLUCOSE 91    < > 103* 95 76 77 101*  BUN 58*   < > 68* 81* 78* 81* 80*  CREATININE 2.02*   < > 1.91* 2.18* 1.92* 1.97* 1.86*  CALCIUM 8.6*   < > 8.5* 8.6* 8.1* 8.6* 8.6*  MG 2.6*  --   --   --   --   --  2.8*  PHOS  --   --   --   --   --   --  4.1   < > = values in this interval not displayed.   Estimated Creatinine Clearance: 31.9 mL/min (A) (by C-G formula based on SCr of 1.86 mg/dL (H)).   LIVER No results for input(s): AST, ALT, ALKPHOS, BILITOT, PROT, ALBUMIN, INR in the last 168 hours.   INFECTIOUS Recent Labs  Lab 06/27/18 2049  LATICACIDVEN 0.75     ENDOCRINE CBG (last 3)  Recent Labs    07/03/18 1140 07/03/18 1542 07/03/18 1931  GLUCAP 95 128* 87         IMAGING x48h  - image(s) personally visualized  -   highlighted in bold Dg Chest 1 View  Result Date: 07/01/2018 CLINICAL DATA:  Central line placement EXAM: CHEST  1 VIEW COMPARISON:  07/01/2018 FINDINGS: Right IJ central venous catheter tip is at the cavoatrial junction. Moderate cardiomegaly is unchanged. Left chest wall pacemaker leads are unchanged in position. There is left-greater-than-right basilar opacity, likely atelectasis. Severe osteoarthrosis of the right shoulder. IMPRESSION: Right IJ central venous catheter tip at the cavoatrial junction. Electronically Signed   By: Ulyses Jarred M.D.   On: 07/01/2018 22:56     Resolved Hospital Problem list     Assessment & Plan:   Acute on chronic hypercarbic respiratory failure: in the setting of PNA, CHF, and decompensated OSA.   Nocturnal BiPAP  COPD without acute exacerbation OSA, noncompliant with CPAP - continue duonebs, PRN albuterol  Shock, presumed cardiogenic +/- septic. In the setting of Acute on chronic HFrEF 25-30% EF. - baseline SBP in mid to high 90s.    - MAP 79 on 07/02/18 - Milrinone gtt and lasix gtt - decrease furosemide infusion as approaching euvolemia.   HCAP - Has been narrowed to cefepime. Continue for 8 day course.  - Follow  cultures  AKI on CKD III - diuresis as tolerated - Follow BMP - hopefully he does not progress to require HD.   Myelodysplasia low grade. Followed by Dr. Marin Olp. Pancytopenia - Holding revlimid while NPO but can challenge 07/03/2018 due to more wakefulness  Atrial fibrillation no longer on anticoagulation due to bleeding risk - per cardiology.  Hyperkalemia -  kayexalate   - lasix gtt  Best practice:  Diet: NPO Pain/Anxiety/Delirium protocol (if indicated): n/a VAP protocol (if indicated): n/a DVT prophylaxis: sq heparin GI prophylaxis: protonix Glucose control:  Mobility: Bedrest Code Status: FULL Family Communication: patient, wife, and daughter (nurse) updated at bedside 07/01/18. None at bedside 07/03/2018  Disposition: ICU   ATTESTATION & SIGNATURE   The patient TREYDEN HAKIM Sr. is critically ill with multiple organ systems failure and requires high complexity decision making for assessment and support, frequent evaluation and titration of therapies, application of advanced monitoring technologies and extensive interpretation of multiple databases.   Critical Care Time devoted to patient care services described in this note is  30  Minutes. This time reflects time of care of this signee Dr Brand Males. This critical care time does not reflect procedure time, or teaching time or supervisory time of PA/NP/Med student/Med Resident etc but could involve care discussion time     Kipp Brood, MD [facility] ICU Physician [place]  Pager: [phone] Mobile: [phone] After hours: [phone].   07/03/2018 8:27 PM

## 2018-07-03 NOTE — Progress Notes (Signed)
Patient currently on 6L HFNC.  Patient refused CPAP at this time. Requests to remain on oxygen. States he is not sleeping at this time (no need for CPAP)  Wife states he is getting ready for his bath. RT will follow up.

## 2018-07-03 NOTE — Progress Notes (Signed)
eLink Physician-Brief Progress Note Patient Name: Anthony PELLA Sr. DOB: 1932/05/25 MRN: [id number]   Date of Service  07/03/2018  HPI/Events of Note  Nurse notes blood/dark blood in stool. Last Hgb = 7.8. CBC already ordered for AM.  eICU Interventions  Will order stool for occult blood.      Intervention Category Major Interventions: Other:  Lysle Dingwall 07/03/2018, 11:35 PM

## 2018-07-03 NOTE — Progress Notes (Signed)
[facility]  Patient Status: Active (receiving services up to time of hospitalization)  [facility] is providing the following services: RN  If patient discharges after hours, please call [phone].   Janae Sauce 07/03/2018, 10:41 AM

## 2018-07-03 NOTE — Consult Note (Addendum)
Advanced Heart Failure Team Consult Note   Primary Physician: [name], [name] Him, MD PCP-Cardiologist:  [name], MD  Reason for Consultation: Mixed cardiogenic/septic shock in setting of PNA  HPI:    Anthony DOLLINGER Sr. is seen today for evaluation of mixed cardiogenic/septic shock at the request of Dr. [name].   Anthony Agar Sr. is a 84 y.o. male  with a hx of PMH of CKD stage III, afib, HTN, HLD, MDS, NICM, symptomatic bradycardia s/p St Jude CRT-P, COPD on home O2, OSA, RLS and pulmonary HTN   Patient has a long-standing history of NICM. EF 40-45% in 2010. Cath at that time showed normal coronaries.  S/p Single chamber PPM 10/2011 -> CRT-P uprgrade 03/2018. Echoes in 2016 and 2018 with normal EF.   Admitted 09/2017 with Acute CHF. Echo showed EF down to 25-30%, mild MR, peak PA pressure 111 mmHg.   Admitted again 10/2017 for symptomatic anemia requiring transfusion. EGD showed angiodysplastic lesion in the gastric antrum and duodenum. Eliquis discontinued 01/2018 with concerns for high risk bleeding.   [name] presented to [facility] 06/27/18 with progressive weakness and SOB. CXR showed small left pleural effusion with consolidation. Pertinent labs on admission include K 4.5, Cr 2.02 (From 1.3), BNP 1678. Panyctopenia noted in setting of myelodysplastic syndrome. Started on ABX for PNA. Lasix held due to hypotension. Became hyperkalemic, lethargic, and orthopneic. Started on IV diuretics and milrinone with "cold and wet" physiology. Has required BiPAP intermittently. CHF team asked to see with cardiogenic shock requiring dual pressors intermittently.   Required levophed overnight, now off. Coox 74.9% on milrinone 0.25 mcg/kg/min. SBPs running 90-100s. CVP 16-17. Afebrile.   Cr trending down from 2.1 at 1.8, though baseline closer to 1.3. K 4.5. Negative 1 L on lasix gtt. (cut back to 4 mg/hr with hypotension overnight).  Lives at home with his wife. Daughter lives next door. He  is currently SOB at rest, with pursed lipped breathing. Feels better on BiPAP. He confirms he would like to be intubated and shocked/get CPR if he has an arrest. Denies CP. Denies lightheadedness or dizziness, but hasn't been out of bed. Continues with cough with some production.  Pt SOB at rest and unable to give more history at this time.   Echo 01/13/18 LVEF 25-30%, Mild AI, Mild MR, Severe LAE, MOD dilated RV, severe RAE, Mod TR, PA peak pressure 53 mm Hg.   Review of systems complete and found to be negative unless listed in HPI.    Home Medications Prior to Admission medications   Medication Sig Start Date End Date Taking? Authorizing Provider  acetaminophen (TYLENOL) 650 MG CR tablet Take 1,300 mg by mouth every 8 (eight) hours as needed for pain.   Yes [provider]  allopurinol (ZYLOPRIM) 100 MG tablet Take 2 tablets (200 mg total) by mouth daily. Patient taking differently: Take 200 mg by mouth daily as needed (for gout flares).  05/29/18  Yes Medina-Vargas, Monina C, NP  aspirin EC 81 MG EC tablet Take 1 tablet (81 mg total) by mouth daily. 05/12/18  Yes Alma Friendly, MD  Dextran 70-Hypromellose (ARTIFICIAL TEARS PF OP) Place 2 drops into both eyes 3 (three) times daily as needed (for dryness).   Yes [provider]  diphenhydrAMINE-zinc acetate (BENADRYL) cream Apply topically 2 (two) times daily as needed for itching. 05/11/18  Yes Alma Friendly, MD  dutasteride (AVODART) 0.5 MG capsule Take 1 capsule (0.5 mg total) by mouth daily.  05/29/18  Yes Medina-Vargas, Monina C, NP  escitalopram (LEXAPRO) 5 MG tablet Take 1 tablet (5 mg total) by mouth at bedtime. 05/29/18  Yes Medina-Vargas, Monina C, NP  furosemide (LASIX) 80 MG tablet Take 1 tablet (80 mg total) by mouth 2 (two) times daily. 05/29/18  Yes Medina-Vargas, Monina C, NP  ipratropium-albuterol (DUONEB) 0.5-2.5 (3) MG/3ML SOLN Take 3 mLs by nebulization every 4 (four) hours as needed. Patient taking  differently: Take 3 mLs by nebulization every 4 (four) hours as needed (for wheezing or shortness of breath).  05/29/18  Yes Medina-Vargas, Monina C, NP  loratadine (CLARITIN) 10 MG tablet Take 10 mg by mouth daily as needed for allergies or itching.    Yes [provider]  mirtazapine (REMERON) 15 MG tablet Take 1 tablet (15 mg total) by mouth at bedtime. 05/29/18  Yes Medina-Vargas, Monina C, NP  Multiple Vitamin (MULTIVITAMIN WITH MINERALS) TABS tablet Take 1 tablet by mouth daily.   Yes [provider]  Multiple Vitamins-Minerals (PRESERVISION AREDS 2) CAPS Take 1 capsule by mouth daily.   Yes [provider]  OXYGEN Inhale 3 L into the lungs as needed (for shortness of breath).    Yes [provider]  pantoprazole (PROTONIX) 40 MG tablet Take 1 tablet (40 mg total) by mouth daily. 05/29/18  Yes Medina-Vargas, Monina C, NP  potassium chloride (K-DUR) 10 MEQ tablet Take 1 tablet (10 mEq total) by mouth 2 (two) times daily. 05/29/18  Yes Medina-Vargas, Monina C, NP  REVLIMID 5 MG capsule Take 5 mg by mouth See admin instructions. Take 5 mg by mouth once a day for 21 days, then "off" for 7 days, then repeat 06/16/18  Yes [provider]  rOPINIRole (REQUIP) 4 MG tablet Take 1 tablet (4 mg total) by mouth at bedtime. 05/29/18  Yes Medina-Vargas, Monina C, NP  tamsulosin (FLOMAX) 0.4 MG CAPS capsule Take 1 capsule (0.4 mg total) by mouth daily after breakfast. 05/29/18  Yes Medina-Vargas, Monina C, NP  triamcinolone cream (KENALOG) 0.1 % Apply 1 application topically 2 (two) times daily. Patient taking differently: Apply 1 application topically See admin instructions. Apply to itchy areas two times a day as directed 05/29/18  Yes Medina-Vargas, Monina C, NP  VENTOLIN HFA 108 (90 Base) MCG/ACT inhaler Inhale 2 puffs into the lungs every 6 (six) hours as needed for wheezing or shortness of breath.  06/20/18  Yes [provider]    Past Medical  History: Past Medical History:  Diagnosis Date  . Arthritis    "feet" (09/30/2017)  . CHF (congestive heart failure) ([place])   . Chronic bronchitis ([place])   . CKD (chronic kidney disease), stage III ([place])    [name] 09/30/2017  . Coronary artery disease   . Diverticulitis   . Dyspnea   . GERD (gastroesophageal reflux disease)   . Gout   . History of blood transfusion    "blood loss" (09/30/2017)  . Hyperlipidemia   . Hypertension   . MDS (myelodysplastic syndrome), high grade ([place]) 05/11/2018  . MI (myocardial infarction) ([place]) ~ 2000   "light one"  . Nonischemic cardiomyopathy (H[facility])    mild  . OSA on CPAP   . Pacemaker single chamber, [place], 2013 10/13/2011  . Permanent atrial fibrillation    on Eliquis  . Pulmonary hypertension ()   . Restless legs     Past Surgical History: Past Surgical History:  Procedure Laterality Date  . APPENDECTOMY  12/2000   [name] 12/22/2010  .  BIV UPGRADE N/A 02/22/2018   Procedure: BIV UPGRADE;  Surgeon: [name], MD;  Location: [place] CV LAB;  Service: Cardiovascular;  Laterality: N/A;  . BIV UPGRADE N/A 03/29/2018   Procedure: BIV PPM UPGRADE;  Surgeon: [name], MD;  Location: [place] [facility];  Service: Cardiovascular;  Laterality: N/A;  . CARDIAC CATHETERIZATION  11/07/2008   nonischemic cardiomyopathy,pulmonary hypertension  . CATARACT EXTRACTION W/ INTRAOCULAR LENS  IMPLANT, BILATERAL Bilateral   . CIRCUMCISION  12/2005   [name] 12/22/2010  . COLOSTOMY  12/2000   [name] 12/22/2010  . COLOSTOMY REVERSAL  07/2001   [name] 12/22/2010  . CORONARY ANGIOPLASTY  06/01/1999   successful to ostium of the first diagonal  . ESOPHAGOGASTRODUODENOSCOPY N/A 08/22/2013   Procedure: ESOPHAGOGASTRODUODENOSCOPY (EGD);  Surgeon: [name], MD;  Location: [facility] ENDOSCOPY;  Service: Endoscopy;  Laterality: N/A;  h/p in file cabinet, jackie  . ESOPHAGOGASTRODUODENOSCOPY N/A 11/05/2014   Procedure:  ESOPHAGOGASTRODUODENOSCOPY (EGD);  Surgeon: Clarene Essex, MD;  Location: [facility] ENDOSCOPY;  Service: Endoscopy;  Laterality: N/A;  . ESOPHAGOGASTRODUODENOSCOPY N/A 11/08/2016   Procedure: ESOPHAGOGASTRODUODENOSCOPY (EGD);  Surgeon: [name], MD;  Location: [facility] ENDOSCOPY;  Service: Endoscopy;  Laterality: N/A;  . ESOPHAGOGASTRODUODENOSCOPY (EGD) WITH PROPOFOL Left 11/05/2017   Procedure: ESOPHAGOGASTRODUODENOSCOPY (EGD) WITH PROPOFOL;  Surgeon: [name], MD;  Location: [place];  Service: Endoscopy;  Laterality: Left;  . HOT HEMOSTASIS N/A 11/05/2014   Procedure: HOT HEMOSTASIS (ARGON PLASMA COAGULATION/BICAP);  Surgeon: Clarene Essex, MD;  Location: [facility] ENDOSCOPY;  Service: Endoscopy;  Laterality: N/A;  . INSERT / REPLACE / REMOVE PACEMAKER    . JOINT REPLACEMENT    . MASS EXCISION Left    hand w/ulnar artery reconstruction/notes 12/22/2010  . NM MYOCAR PERF WALL MOTION  11/24/2007   normal  . PERMANENT PACEMAKER INSERTION  10/04/2012   [employer]  . PERMANENT PACEMAKER INSERTION N/A 10/12/2011   Procedure: PERMANENT PACEMAKER INSERTION;  Surgeon: [name], MD;  Location: [place] CATH LAB;  Service: Cardiovascular;  Laterality: N/A;  . REPLACEMENT TOTAL KNEE Bilateral 11/2006   right-left/notes 12/22/2010  . ROTATOR CUFF REPAIR Right 09/2004   [name] 12/22/2010  . SAVORY DILATION N/A 08/22/2013   Procedure: SAVORY DILATION;  Surgeon: [name], MD;  Location: [facility] ENDOSCOPY;  Service: Endoscopy;  Laterality: N/A;  . SHOULDER SURGERY Right    "fell off house; messed up 3 things in my arm"  . TONSILLECTOMY    . US ECHOCARDIOGRAPHY  02/01/2011   LA is mod-severely dilated,AOV & root sclerotic,ca+ AOV leaflets    Family History: Family History  Problem Relation Age of Onset  . Cancer Mother   . Heart attack Father   . Diabetes Brother     Social History: Social History   Socioeconomic History  . Marital status: Married    Spouse name: Not on file  . Number of children: Not  on file  . Years of education: Not on file  . Highest education level: Not on file  Occupational History  . Occupation: retired  Scientific laboratory technician  . Financial resource strain: Not on file  . Food insecurity:    Worry: Not on file    Inability: Not on file  . Transportation needs:    Medical: Not on file    Non-medical: Not on file  Tobacco Use  . Smoking status: Former Research scientist (life sciences)  . Smokeless tobacco: Never Used  . Tobacco comment: 09/30/2017 "it's been over 13yr since I smoked anything"  Substance and Sexual  Activity  . Alcohol use: No  . Drug use: No  . Sexual activity: Not Currently  Lifestyle  . Physical activity:    Days per week: Not on file    Minutes per session: Not on file  . Stress: Not on file  Relationships  . Social connections:    Talks on phone: Not on file    Gets together: Not on file    Attends religious service: Not on file    Active member of club or organization: Not on file    Attends meetings of clubs or organizations: Not on file    Relationship status: Not on file  Other Topics Concern  . Not on file  Social History Narrative  . Not on file    Allergies:  Allergies  Allergen Reactions  . Vicodin [Hydrocodone-Acetaminophen] Itching and Nausea Only    Objective:    Vital Signs:   Temp:  [97.7 F (36.5 C)-98.6 F (37 C)] 97.9 F (36.6 C) (11/25 0744) Pulse Rate:  [70-79] 71 (11/25 1100) Resp:  [14-24] 19 (11/25 1100) BP: (85-136)/(5-71) 103/54 (11/25 1100) SpO2:  [84 %-100 %] 97 % (11/25 1100) FiO2 (%):  [30 %] 30 % (11/25 0800) Weight:  [91.6 kg] 91.6 kg (11/25 0500) Last BM Date: 06/29/18  Weight change: Filed Weights   07/01/18 0135 07/02/18 0500 07/03/18 0500  Weight: 93.6 kg 93.7 kg 91.6 kg    Intake/Output:   Intake/Output Summary (Last 24 hours) at 07/03/2018 1131 Last data filed at 07/03/2018 1117 Gross per 24 hour  Intake 1505.33 ml  Output 2712.5 ml  Net -1207.17 ml      Physical Exam    CVP 17  General:   Elderly and chronically ill appearing. SOB at rest.  HEENT: Pursed lip breathing.  Neck: supple. JVP to jaw. Carotids 2+ bilat; no bruits. No lymphadenopathy or thyromegaly appreciated. Cor: PMI nondisplaced. Regular rate & rhythm.  Lungs: Diminished throughout.  Abdomen: Soft, nontender, nondistended. No hepatosplenomegaly. No bruits or masses. Good bowel sounds. Extremities: no cyanosis, clubbing, or rash. Trace to 1+ ankle edema.  Neuro: alert & orientedx3, cranial nerves grossly intact. moves all 4 extremities w/o difficulty. Affect flat but appropriate.   Telemetry   BiV paced 70s, personally reviewed.   EKG    BiV paced 72 bpm, personally reviewed.   Labs   Basic Metabolic Panel: Recent Labs  Lab 06/27/18 1910  06/30/18 0502 07/01/18 0622 07/01/18 2326 07/02/18 0404 07/03/18 0440  NA 139   < > 135 132* 133* 133* 136  K 4.5   < > 5.0 6.0* 5.2* 5.3* 4.5  CL 99   < > 98 95* 97* 95* 98  CO2 33*   < > 33* 31 29 30  33*  GLUCOSE 91   < > 103* 95 76 77 101*  BUN 58*   < > 68* 81* 78* 81* 80*  CREATININE 2.02*   < > 1.91* 2.18* 1.92* 1.97* 1.86*  CALCIUM 8.6*   < > 8.5* 8.6* 8.1* 8.6* 8.6*  MG 2.6*  --   --   --   --   --  2.8*  PHOS  --   --   --   --   --   --  4.1   < > = values in this interval not displayed.    Liver Function Tests: No results for input(s): AST, ALT, ALKPHOS, BILITOT, PROT, ALBUMIN in the last 168 hours. No results for input(s): LIPASE, AMYLASE in the  last 168 hours. No results for input(s): AMMONIA in the last 168 hours.  CBC: Recent Labs  Lab 06/28/18 0409 06/29/18 0841 06/30/18 0502 07/01/18 0622 07/02/18 0404 07/03/18 0440  WBC 3.0* 4.2 4.1 3.9* 3.0* 3.4*  NEUTROABS 2.2  --   --  2.9 1.9 2.2  HGB 8.2* 9.1* 8.4* 8.9* 8.0* 7.8*  HCT 26.7* 30.4* 28.0* 30.1* 26.1* 25.4*  MCV 117.6* 120.6* 121.7* 119.9* 117.0* 117.1*  PLT 95* 90* 92* 85* 74* 72*    Cardiac Enzymes: Recent Labs  Lab 06/27/18 2146 06/28/18 0409 06/28/18 0811    TROPONINI 0.14* 0.10* 0.09*    BNP: BNP (last 3 results) Recent Labs    01/12/18 0306 05/03/18 0406 06/27/18 1910  BNP 1,360.9* 1,865.0* 1,678.1*    ProBNP (last 3 results) No results for input(s): PROBNP in the last 8760 hours.   CBG: Recent Labs  Lab 07/02/18 1558 07/02/18 1956 07/03/18 0008 07/03/18 0436 07/03/18 0726  GLUCAP 107* 82 86 87 92    Coagulation Studies: No results for input(s): LABPROT, INR in the last 72 hours.   Imaging    No results found.   Medications:     Current Medications: . chlorhexidine  15 mL Mouth Rinse BID  . escitalopram  5 mg Oral Daily  . heparin injection (subcutaneous)  5,000 Units Subcutaneous Q8H  . ipratropium-albuterol  3 mL Nebulization Q6H  . lenalidomide  5 mg Oral QHS  . mouth rinse  15 mL Mouth Rinse q12n4p  . pantoprazole  40 mg Oral Daily  . rOPINIRole  4 mg Oral QHS  . sodium chloride flush  3 mL Intravenous Q12H  . sodium polystyrene  30 g Oral Once     Infusions: . sodium chloride Stopped (07/01/18 1517)  . ceFEPime (MAXIPIME) IV Stopped (07/03/18 0041)  . dextrose 25 mL/hr at 07/03/18 1100  . furosemide (LASIX) infusion 4 mg/hr (07/03/18 1100)  . milrinone 0.25 mcg/kg/min (07/03/18 0600)  . norepinephrine (LEVOPHED) Adult infusion Stopped (07/03/18 1051)     Patient Profile   Anthony MCGLASSON Sr. is a 84 y.o. male with a hx of PMH of CKD stage III, afib, HTN, HLD, MDS, NICM, symptomatic bradycardia s/p St Jude CRT-P, COPD on home O2, OSA, RLS and pulmonary HTN   Assessment/Plan   1. Acute on chronic systolic CHF: in setting of sepsis. EF down to 25-30%, from normal 2018. Last cath 2010 with normal coronaries at that time. He currently has NYHA IV symptoms with SOB at rest, and isn't able to give much history.  - Echo 01/13/18 LVEF 25-30%, Mild AI, Mild MR, Severe LAE, MOD dilated RV, severe RAE, Mod TR, PA peak pressure 53 mm Hg.  - Cardiac output good on milrinone 0.25 mcg/kg/min with Coox of  75%.  - Volume status markedly elevated with CVP 17. Discussed with Dr. Aundra Dubin. Will give 80 mg IV lasix now, and increase gtt to 12 mg/hr.  - Start back levophed and titrate as needed - He is not candidate for advanced therapies with advanced age and comorbidities. Not candidate for cath at this juncture with AKI and orthopnea.   2. Sepsis: Suspected likely due to PNA; though mixed cardiogenic component possible - On cefepime per CCM.  3. Acute on chronic resp failure.  - Requiring BiPAP intermittently - Chronically non-compliant with CPAP.  4. AKI on CKD II-III - Follow with support and diuresis. Suspect cardiorenal syndrome.  - Cr 1.8 today. Follow with diuresis.   5. Chronic Afib -  Rate controlled - Off blood thinners with GI bleeding.  - CHA2DS2/VASc is at least 6. Poor candidate to restart.  6. Myelodysplasia: Has chronic mild/moderate pancytopenia and macrocytosis - Prescribed Revlimid/Aranesp by Dr. Marin Olp. Whether or not he has been taking is in question.  7. PAH ? - Echo in 2/19 showed PA peak pressure in 110 range, though Echo in 6/19 showed PA peak pressure 53. Suspect pulmonary venous HTN in setting of volume overload.   8. Disposition - Unclear at this time. Not candidate for advanced therapies. Confirms full code. If does not improve, may need to revisit goals of care conversation. No family available at this time to discuss.   Medication concerns reviewed with patient and pharmacy team. Barriers identified: Not at this time.   Length of Stay: [address]  Annamaria Helling  07/03/2018, 11:31 AM  Advanced Heart Failure Team Pager [phone] (M-F; 7a - 4p)  Please contact [facility] for night-coverage after hours (4p -7a ) and weekends on [email or web address]  Patient seen with PA, agree with the above note.  CVP 16, co-ox 75% on milrinone 0.25.  Lasix was cut down due to soft BP.  He remains on cefepime for ?PNA.   On exam, JVP 16 cm.  Decreased BS at bases.   Regular S1S2.  No edema.   1. Acute on chronic systolic CHF: Echo 3/33 with EF 25-30%. St Jude CRT-P. He is volume overloaded on exam, now on milrinone 0.25.  Poor UOP with rising creatinine to 1.8.  Lasix cut back because of low BP.  CVP 16+.  - Will continue milrinone 0.25 with good co-ox 75%.  - Add norepinephrine at low dose to maintain SBP > 90 and allow for diuresis.  - Increase Lasix gtt to 12 mg/hr and will give 80 mg IV x 1 now - Not candidate for advanced therapies with age and other comorbidities.  Will try to manage volume with milrinone, will try to titrate off eventually.  - Repeat echo.  - Will have St Jude check device, make sure she is BiV pacing appropriately.  2. Atrial fibrillation: Chronic. He has not been anticoagulated due to AVM bleeding.   3. ID: Possible PNA.  He is on cefepime.  4. AKI on CKD stage 3: Creatinine 1.8, baseline around 1.3.  Will try to support cardiac output with pressors/inotropes.  He would be a poor HD candidate.  5. Myelodysplastic syndrome: Pancytopenia.   There has been discussion of hospice before, but wants to be full code currently. Palliative care for goals of care would be helpful.   Loralie Champagne 07/03/2018 1:52 PM

## 2018-07-03 NOTE — Progress Notes (Signed)
RN noticed bloody stool when patient had a bowel movement. Elink MD notified. Orders given.

## 2018-07-04 ENCOUNTER — Inpatient Hospital Stay (HOSPITAL_COMMUNITY): Payer: Medicare Other

## 2018-07-04 ENCOUNTER — Ambulatory Visit (HOSPITAL_COMMUNITY): Payer: Medicare Other

## 2018-07-04 DIAGNOSIS — I340 Nonrheumatic mitral (valve) insufficiency: Secondary | ICD-10-CM

## 2018-07-04 DIAGNOSIS — Z7189 Other specified counseling: Secondary | ICD-10-CM

## 2018-07-04 LAB — ECHOCARDIOGRAM COMPLETE
HEIGHTINCHES: 67 in
WEIGHTICAEL: 3375.68 [oz_av]

## 2018-07-04 LAB — CBC WITH DIFFERENTIAL/PLATELET
Abs Immature Granulocytes: 0.01 10*3/uL (ref 0.00–0.07)
BASOS ABS: 0 10*3/uL (ref 0.0–0.1)
Basophils Relative: 1 %
EOS ABS: 0.1 10*3/uL (ref 0.0–0.5)
Eosinophils Relative: 3 %
HEMATOCRIT: 25.9 % — AB (ref 39.0–52.0)
HEMOGLOBIN: 7.9 g/dL — AB (ref 13.0–17.0)
Immature Granulocytes: 0 %
LYMPHS ABS: 0.5 10*3/uL — AB (ref 0.7–4.0)
LYMPHS PCT: 19 %
MCH: 35.7 pg — ABNORMAL HIGH (ref 26.0–34.0)
MCHC: 30.5 g/dL (ref 30.0–36.0)
MCV: 117.2 fL — AB (ref 80.0–100.0)
Monocytes Absolute: 0.5 10*3/uL (ref 0.1–1.0)
Monocytes Relative: 19 %
NRBC: 0.7 % — AB (ref 0.0–0.2)
Neutro Abs: 1.6 10*3/uL — ABNORMAL LOW (ref 1.7–7.7)
Neutrophils Relative %: 58 %
Platelets: 57 10*3/uL — ABNORMAL LOW (ref 150–400)
RBC: 2.21 MIL/uL — ABNORMAL LOW (ref 4.22–5.81)
RDW: 19.1 % — ABNORMAL HIGH (ref 11.5–15.5)
WBC: 2.7 10*3/uL — ABNORMAL LOW (ref 4.0–10.5)

## 2018-07-04 LAB — COOXEMETRY PANEL
Carboxyhemoglobin: 2.3 % — ABNORMAL HIGH (ref 0.5–1.5)
Methemoglobin: 1.1 % (ref 0.0–1.5)
O2 Saturation: 74 %
Total hemoglobin: 8.3 g/dL — ABNORMAL LOW (ref 12.0–16.0)

## 2018-07-04 LAB — MAGNESIUM: Magnesium: 2.5 mg/dL — ABNORMAL HIGH (ref 1.7–2.4)

## 2018-07-04 LAB — GLUCOSE, CAPILLARY
GLUCOSE-CAPILLARY: 96 mg/dL (ref 70–99)
GLUCOSE-CAPILLARY: 109 mg/dL — AB (ref 70–99)
Glucose-Capillary: 91 mg/dL (ref 70–99)
Glucose-Capillary: 92 mg/dL (ref 70–99)
Glucose-Capillary: 121 mg/dL — ABNORMAL HIGH (ref 70–99)

## 2018-07-04 LAB — PHOSPHORUS: Phosphorus: 4.7 mg/dL — ABNORMAL HIGH (ref 2.5–4.6)

## 2018-07-04 LAB — BASIC METABOLIC PANEL
ANION GAP: 7 (ref 5–15)
BUN: 74 mg/dL — ABNORMAL HIGH (ref 8–23)
CHLORIDE: 94 mmol/L — AB (ref 98–111)
CO2: 34 mmol/L — ABNORMAL HIGH (ref 22–32)
Calcium: 8.5 mg/dL — ABNORMAL LOW (ref 8.9–10.3)
Creatinine, Ser: 1.52 mg/dL — ABNORMAL HIGH (ref 0.61–1.24)
GFR calc Af Amer: 47 mL/min — ABNORMAL LOW (ref >60)
GFR, EST NON AFRICAN AMERICAN: 40 mL/min — AB (ref >60)
Glucose, Bld: 93 mg/dL (ref 70–99)
Potassium: 4.1 mmol/L (ref 3.5–5.1)
SODIUM: 135 mmol/L (ref 135–145)

## 2018-07-04 MED ORDER — ACETAMINOPHEN 500 MG PO TABS
1000.0000 mg | ORAL_TABLET | Freq: Three times a day (TID) | ORAL | Status: DC | PRN
  Administered 2018-07-04 – 2018-07-09 (×7): 1000 mg via ORAL
  Filled 2018-07-04 (×7): qty 2

## 2018-07-04 NOTE — Progress Notes (Signed)
NAME:  [name]., MRN:  [id number], DOB:  03-06-1932, LOS: 7 ADMISSION DATE:  06/27/2018, CONSULTATION DATE:  11/23 REFERRING MD:  Dr. [name], CHIEF COMPLAINT:  SOB   Brief History    84 year old male with past medical history as below, which is significant for COPD (on 3 L supplemental oxygen at home), systolic congestive heart failure with recent ejection fraction 25 to 30%, CKD stage III and myelodysplasia.  He was admitted to [facility] on 11/19 with complaints of several days shortness of breath and progressive weakness.  Imaging upon admission was concerning for pneumonia and pleural effusion.  He was admitted for treatment of presumed pneumonia and treated with empiric antibiotics.  Inpatient course became complicated by worsening dyspnea which improved with IV diuretics.  His blood pressure remained borderline so he was difficult to diurese.  On 11/23 cardiology was consulted for further evaluation who felt he had low cardiac output despite hypervolemia.  IV milrinone was started in hopes that he would be able to be diuresed.  He was transferred to ICU for continued shortness of breath requiring BiPAP and the initiation of milrinone infusion.  PCCM was asked to evaluate.   Significant Hospital Events   11/19 admit 11/23 ICU transfer  Consults:  Cardiology 11/23  Procedures:    Significant Diagnostic Tests:  Echo 11/25 >   Micro Data:  Blood 11/19 > neg.  Antimicrobials:  Cefepime 11/19 > (stop date planned 11/27) Vanco 11/19 > 11/20  Interim history/subjective:  On CPAP overnight. Remains on lasix gtt at 12, milrinone at 0.25.  Coox this AM 74. UOP 3.3L past 24hrs. Had some tarry stool overnight, no FOBT done.  Pending this AM.  Pt does not complain of any bleeding. Awake this AM, asking to come of CPAP.  Objective   Blood pressure (!) 100/52, pulse 71, temperature (!) 96.9 F (36.1 C), temperature source Axillary, resp. rate 15, height 5\' 7"   (1.702 m), weight 95.7 kg, SpO2 97 %. CVP:  [10 mmHg-19 mmHg] 19 mmHg  FiO2 (%):  [30 %] 30 %   Intake/Output Summary (Last 24 hours) at 07/04/2018 0802 Last data filed at 07/04/2018 0600 Gross per 24 hour  Intake 1719.67 ml  Output 3215 ml  Net -1495.33 ml   Filed Weights   07/02/18 0500 07/03/18 0500 07/04/18 0500  Weight: 93.7 kg 91.6 kg 95.7 kg   PHYSICAL EXAM: General: Adult male, resting in bed, in NAD. Neuro: Awake, asking to come off CPAP.  No deficits. HEENT: [place]/AT. Sclerae anicteric. EOMI. Cardiovascular: RRR, no M/R/G.  Lungs: Respirations even and unlabored.  CTA bilaterally, No W/R/R.  Abdomen: BS x 4, soft, NT/ND.  Musculoskeletal: No gross deformities, no edema.  Skin: Intact, warm, no rashes.   Assessment & Plan:   Acute on chronic hypercarbic respiratory failure: in the setting of PNA, CHF, and decompensated OSA (hx noncompliance with CPAP). - Continue nocturnal NIMV.  COPD without acute exacerbation - Continue duonebs, PRN albuterol  Shock, presumed primary cardiogenic in the setting of Acute on chronic HFrEF (25-30% EF with baseline SBP in mid to high 90s)  +/- septic from possible HCAP (cultures have remained negative). - Continue Milrinone gtt and lasix gtt per cards.  - F/u on echo. - Per cards, not a candidate for advanced therapies.  Atrial fibrillation no longer on anticoagulation due to bleeding risk - Continue to monitor.  Possible HCAP - cultures negative. Continue cefepime through 11/27 for total 8 days.  AKI on  CKD III - improving with diuresis. - Continue diuresis as tolerated, cards recommending to continue norepi to facilitate further diuresis with lasix gtt. - Follow BMP. - hopefully will continue to improve and does not progress to require HD.   Myelodysplasia low grade. Followed by Dr. Marin Olp. Pancytopenia - Continue preadmission revlimid.  ? Melena overnight 11/25 - FOBT pending, Hgb stable. - Monitor. - Transfuse for Hgb <  7. - GI consult if continues.  Best practice:  Diet: Heart healthy. Pain/Anxiety/Delirium protocol (if indicated): n/a VAP protocol (if indicated): n/a DVT prophylaxis: sq heparin GI prophylaxis: protonix Glucose control: none. Mobility: Bedrest Code Status: FULL Family Communication: patient, wife, and daughter (nurse) updated at bedside 07/01/18. None at bedside 11/26 Disposition: ICU.  CC time: 30 min.   Montey Hora, [place] [facility] Pager: [phone]  or [phone] 07/04/2018, 8:48 AM

## 2018-07-04 NOTE — Progress Notes (Signed)
  Echocardiogram 2D Echocardiogram has been performed.  Haider Hornaday G Yesmin Mutch 07/04/2018, 10:02 AM

## 2018-07-04 NOTE — Progress Notes (Signed)
Placed patient on CPAP via FFM, 10.0 cm H20 @ 30% Fio2. Tolerating well at this time.

## 2018-07-04 NOTE — Progress Notes (Addendum)
Advanced Heart Failure Rounding Note  PCP-Cardiologist: [name], MD   Subjective:    Coox 74% on milrinone 0.25 mcg/kg/min and now off NE.   Negative 1.6 L, though weight shows up 9 lbs (bed weights)  Breathing improved. Denies SOB at rest, although still intermittently requiring BiPAP and pursed lip breathing. CVP 11 (down from 15-16)  Cr 1.86 -> 1.52 with aggressive diuresis.   Objective:   Weight Range: 95.7 kg Body mass index is 33.04 kg/m.   Vital Signs:   Temp:  [96.9 F (36.1 C)-98.3 F (36.8 C)] 96.9 F (36.1 C) (11/26 0737) Pulse Rate:  [70-80] 71 (11/26 0737) Resp:  [13-25] 15 (11/26 0737) BP: (85-134)/(48-122) 100/52 (11/26 0600) SpO2:  [94 %-100 %] 97 % (11/26 0740) FiO2 (%):  [30 %] 30 % (11/26 0740) Weight:  [95.7 kg] 95.7 kg (11/26 0500) Last BM Date: 07/03/18  Weight change: Filed Weights   07/02/18 0500 07/03/18 0500 07/04/18 0500  Weight: 93.7 kg 91.6 kg 95.7 kg    Intake/Output:   Intake/Output Summary (Last 24 hours) at 07/04/2018 0823 Last data filed at 07/04/2018 0600 Gross per 24 hour  Intake 1719.67 ml  Output 3215 ml  Net -1495.33 ml      Physical Exam    CVP 11  General:  Elderly and chronically ill appearing. NAD HEENT: normal, occasional pursed lip breathing Neck: Supple. JVP 10-11 cm. Carotids 2+ bilat; no bruits. No lymphadenopathy or thyromegaly appreciated. Cor: PMI nondisplaced. Regular rate & rhythm. No rubs, gallops or murmurs. Lungs: Diminished throughout.  Abdomen: Soft, nontender, nondistended. No hepatosplenomegaly. No bruits or masses. Good bowel sounds. Extremities: No cyanosis, clubbing, or rash. Trace to 1+ edema Neuro: Alert & orientedx3, cranial nerves grossly intact. moves all 4 extremities w/o difficulty. Affect flat but appropriate.  Telemetry   BiV paced 70s, personally reviewed.   EKG    No new tracings.    Labs    CBC Recent Labs    07/03/18 0440 07/04/18 0336  WBC 3.4* 2.7*    NEUTROABS 2.2 1.6*  HGB 7.8* 7.9*  HCT 25.4* 25.9*  MCV 117.1* 117.2*  PLT 72* 57*   Basic Metabolic Panel Recent Labs    07/03/18 0440 07/04/18 0336  NA 136 135  K 4.5 4.1  CL 98 94*  CO2 33* 34*  GLUCOSE 101* 93  BUN 80* 74*  CREATININE 1.86* 1.52*  CALCIUM 8.6* 8.5*  MG 2.8* 2.5*  PHOS 4.1 4.7*   Liver Function Tests No results for input(s): AST, ALT, ALKPHOS, BILITOT, PROT, ALBUMIN in the last 72 hours. No results for input(s): LIPASE, AMYLASE in the last 72 hours. Cardiac Enzymes No results for input(s): CKTOTAL, CKMB, CKMBINDEX, TROPONINI in the last 72 hours.  BNP: BNP (last 3 results) Recent Labs    01/12/18 0306 05/03/18 0406 06/27/18 1910  BNP 1,360.9* 1,865.0* 1,678.1*    ProBNP (last 3 results) No results for input(s): PROBNP in the last 8760 hours.   D-Dimer No results for input(s): DDIMER in the last 72 hours. Hemoglobin A1C No results for input(s): HGBA1C in the last 72 hours. Fasting Lipid Panel No results for input(s): CHOL, HDL, LDLCALC, TRIG, CHOLHDL, LDLDIRECT in the last 72 hours. Thyroid Function Tests No results for input(s): TSH, T4TOTAL, T3FREE, THYROIDAB in the last 72 hours.  Invalid input(s): FREET3  Other results:   Imaging     No results found.   Medications:     Scheduled Medications: . chlorhexidine  15 mL  Mouth Rinse BID  . docusate sodium  100 mg Oral Daily  . escitalopram  5 mg Oral Daily  . heparin injection (subcutaneous)  5,000 Units Subcutaneous Q8H  . ipratropium-albuterol  3 mL Nebulization Q6H  . lenalidomide  5 mg Oral QHS  . mouth rinse  15 mL Mouth Rinse q12n4p  . pantoprazole  40 mg Oral Daily  . rOPINIRole  4 mg Oral QHS  . sodium chloride flush  3 mL Intravenous Q12H     Infusions: . sodium chloride 10 mL/hr at 07/04/18 0600  . ceFEPime (MAXIPIME) IV Stopped (07/03/18 2359)  . dextrose Stopped (07/03/18 1533)  . furosemide (LASIX) infusion 12 mg/hr (07/04/18 0600)  . milrinone 0.25  mcg/kg/min (07/04/18 0500)  . norepinephrine (LEVOPHED) Adult infusion Stopped (07/03/18 1402)     PRN Medications:  sodium chloride, acetaminophen, albuterol, alum & mag hydroxide-simeth, loratadine, sodium chloride, sodium chloride flush    Patient Profile   Anthony YOUKHANA Sr. is a 84 y.o. malewith a hx of PMH of CKD stage III, afib, HTN, HLD, MDS, NICM, symptomatic bradycardia s/p St Jude CRT-P, COPD on home O2, OSA, RLS and pulmonary HTN  Admitted with worsening weakness and DOE. Found to have PNA and acute on chronic systolic CHF.   Assessment/Plan   1. Acute on chronic systolic CHF: Echo 9/44 with EF 25-30%. St Jude CRT-P. He is volume overloaded on exam, now on milrinone 0.25.  Poor UOP with rising creatinine to 1.8.  Lasix cut back because of low BP.  - CVP 11 cm. - Coox 74% and continue milrinone 0.25 mcg/kg/min.  - Continue norepinephrine and titrate as needed to maintain SBP > 90 and allow for diuresis.  - Continue lasix gtt 12 mg/hr - Not candidate for advanced therapies with age and other comorbidities.  Will try to manage volume with milrinone, will try to titrate off eventually.  - Repeat echo pending - Will have St Jude check device, make sure she is BiV pacing appropriately.  - May need to consider cath with worsening EF.  2. Atrial fibrillation: Chronic - He has not been anticoagulated due to AVM bleeding.  No change.  3. ID: - Covering with cefepime for PNA. 4. AKI on CKD stage 3: - Creatinine 1.8 -> 1.52 with aggressive diuresis.  - Continue to support cardiac output with pressors/inotropes.  He would be a poor HD candidate.  5. Myelodysplastic syndrome: - Pancytopenia.  6. Thrombocytopenia - In setting of #5. Continue Sub Q heparin for now.  7. Anemia - Hgb +, but Hgb stable. Follow for now. Chronic component.   Medication concerns reviewed with patient and pharmacy team. Barriers identified: None at this time.   Length of Stay: [place], [place]  07/04/2018, 8:23 AM  Advanced Heart Failure Team Pager [phone] (M-F; 7a - 4p)  Please contact [facility] for night-coverage after hours (4p -7a ) and weekends on [email or web address]  Patient seen with PA, agree with the above note.  CVP 11, co-ox 74% on milrinone 0.25.  He is not on norepinephrine.  SBP 90s-100s.  He remains on cefepime for ?PNA.  Still reports dyspnea.   On exam, JVP 12-13 cm.  Pursed lip breathing.  Decreased BS at bases.  Regular S1S2.  No edema.   1. Acute on chronic systolic CHF: Echo 3/84 with EF 25-30%. St Jude CRT-P, he has been BiV pacing appropriately by device interogation. Now on milrinone 0.25, not on norepinephrine. Good UOP yesterday on  Lasix 12 mg/hr with creatinine down to 1.5. CVP 11 today with co-ox 74%.  Still short of breath.  - Will continue milrinone 0.25 with good co-ox 74%.  - Continue Lasix 12 mg/hr today.  - Not candidate for advanced therapies with age and other comorbidities.  Will try to manage volume with milrinone, will try to titrate off eventually.  - Repeat echo => needs today. - If he has continued BUN/creatinine improvement, would consider left/right heart cath given significant fall in EF from the past.   2. Atrial fibrillation: Chronic. He has not been anticoagulated due to AVM bleeding.  Overnight there was concern for blood in stool though hgb stable 7.8 => 7.9. 3. ID: Possible PNA.  He is on cefepime.  4. AKI on CKD stage 3:  Will support cardiac output with pressors/inotropes.  Creatinine coming down.  He would be a poor HD candidate.  5. Myelodysplastic syndrome: Pancytopenia.  6. COPD: On home oxygen at baseline.  7. OSA: CPAP at night.   There has been discussion of hospice before, but wants to be full code currently. Palliative care for goals of care would be helpful.   Loralie Champagne 07/04/2018 8:59 AM

## 2018-07-04 NOTE — Consult Note (Signed)
Consultation Note Date: 07/04/2018   Patient Name: [name].  DOB: 08/29/1931  MRN: [id number]  Age / Sex: 84 y.o., male  PCP: [name], [name] Him, MD Referring Physician: Kipp Brood, MD  Reason for Consultation: Establishing goals of care  HPI/Patient Profile: 84 y.o. male  with past medical history of CHF with multiple hospitalizations, s/p CRT-P placement, MDS (on Revlimid), CKD, HTN admitted on 06/27/2018 with acute on chronic CHF exacerbation, cardiogenic/septic shock, PNA. During admission he required bipap and pressors, but now is off pressors and on nasal cannula oxygen. He wears a CPAP at night. He is on milrinone and continuous IV lasix infusion for diuresis. Creatinine was up but is trending down. Palliative medicine consulted for [place].   Clinical Assessment and Goals of Care:  I have reviewed medical records including EPIC notes, labs and imaging, assessed the patient and then met at the bedside along with him and his daughter, [name]-  to discuss diagnosis prognosis, GOC, EOL wishes, disposition and options.  I introduced Palliative Medicine as specialized medical care for people living with serious illness. It focuses on providing relief from the symptoms and stress of a serious illness. The goal is to improve quality of life for both the patient and the family.  The patient is familiar to me as I met with him and his family on previous admission- plan then was to reconvene the next day to continue [place] discussion, however, patient was discharged- Palliative followup at facility was recommended for continued [place] discussion, but that does not appear to have happened.  [name] tells me that patient returned home from SNF after a few weeks of rehab- they had considered enrolling in Hospice, but did not due to discussing it with Dr. Marin Olp and being told that Dr. Marin Olp did not think he was  ready for Hospice, and that if he wanted to take Revlimid, then Hospice would not cover it.  As far as functional and nutritional status- he continues to decline since his last admission. He was able to take a fishing trip with his daughter, and he continues to eat well, he gets tired more easily. He gets SOB while completing ADL.    We discussed their current illness and what it means in the larger context of their on-going co-morbidities.  Natural disease trajectory and expectations at EOL were discussed. We discussed the recurrent and worsening nature of CHF exacerbations. We discussed that while he gets better during this hospitalization, his overall trajectory is not one of improvement- and that he will continue to have exacerbations and return to the hospital until his EOL.   [name]- his daughter- agrees and notes that she has seen this trajectory. She also states that each time the doctor comes in and says "you are improving"...the patient believes that to mean improving overall and that his heart is going to be repaired and his CHF cured.   We discussed the reality is that while he improves during the hospitalization- his overall trajectory is one of decline. Mr.  Curfman does acknowledge this. He stated that last week he felt he was dying. We discussed his wishes for his EOL and dying. He noted he would prefer to die at home. He would prefer to spend the time that he has with his wife and children.   We discussed Code Status. After thorough discussion explaining the difference between DNR and comfort care- Mr. Bieler states that if he "went to [facility]" he would not want to be brought back. However, he is not ready for me to enter a DNR order.     The difference between aggressive medical intervention and comfort care was considered in light of the patient's goals of care. Mr Ellender is not at a point where he desires full comfort care- he does want to continue medical attempts to treat his illnesses.  However, he is also processing the fact that he is possibly in the end stages of his life and there are difficult discussions that affect how that time is spent. He says, "I'm not ready to die, but how do I get ready?" We discussed his spiritual needs. He would like a visit from our [place].  Hospice and Palliative Care services outpatient were explained and offered. Patient's daughter and her siblings are very much in support of patient receiving Hospice services- however, patient is not there yet. He is agreeable to continued discussion.   Questions and concerns were addressed. The family was encouraged to call with questions or concerns.   Primary Decision Maker PATIENT    SUMMARY OF RECOMMENDATIONS -Continue full scope care -Patient is considering DNR status, PMT will continue to follow and discuss -Recommend referral for outpatient Palliative when patient is discharged for continued [place] consult  Code Status/Advance Care Planning:  Full code  Psycho-social/Spiritual:   Desire for further Chaplaincy support:yes  Additional Recommendations: Caregiving  Support/Resources  Prognosis:    Unable to determine  Discharge Planning: To Be Determined  Primary Diagnoses: Present on Admission: . PNA (pneumonia) . Acute on chronic respiratory failure with hypoxia ([place]) . Acute renal failure superimposed on stage 3 chronic kidney disease ([place]) . Biventricular cardiac pacemaker  st Judes  dual chamber with LV in atrial port  . Chronic atrial fibrillation . Chronic systolic heart failure ([place])   I have reviewed the medical record, interviewed the patient and family, and examined the patient. The following aspects are pertinent.  Past Medical History:  Diagnosis Date  . Arthritis    "feet" (09/30/2017)  . CHF (congestive heart failure) ([place])   . Chronic bronchitis ([place])   . CKD (chronic kidney disease), stage III ([place])    [name] 09/30/2017  . Coronary artery disease     . Diverticulitis   . Dyspnea   . GERD (gastroesophageal reflux disease)   . Gout   . History of blood transfusion    "blood loss" (09/30/2017)  . Hyperlipidemia   . Hypertension   . MDS (myelodysplastic syndrome), high grade ([place]) 05/11/2018  . MI (myocardial infarction) ([place]) ~ 2000   "light one"  . Nonischemic cardiomyopathy ([facility])    mild  . OSA on CPAP   . Pacemaker single chamber, [place], 2013 10/13/2011  . Permanent atrial fibrillation    on Eliquis  . Pulmonary hypertension ([place])   . Restless legs    Social History   Socioeconomic History  . Marital status: Married    Spouse name: Not on file  . Number of children: Not on file  . Years of education:  Not on file  . Highest education level: Not on file  Occupational History  . Occupation: retired  Scientific laboratory technician  . Financial resource strain: Not on file  . Food insecurity:    Worry: Not on file    Inability: Not on file  . Transportation needs:    Medical: Not on file    Non-medical: Not on file  Tobacco Use  . Smoking status: Former Research scientist (life sciences)  . Smokeless tobacco: Never Used  . Tobacco comment: 09/30/2017 "it's been over 24yrsince I smoked anything"  Substance and Sexual Activity  . Alcohol use: No  . Drug use: No  . Sexual activity: Not Currently  Lifestyle  . Physical activity:    Days per week: Not on file    Minutes per session: Not on file  . Stress: Not on file  Relationships  . Social connections:    Talks on phone: Not on file    Gets together: Not on file    Attends religious service: Not on file    Active member of club or organization: Not on file    Attends meetings of clubs or organizations: Not on file    Relationship status: Not on file  Other Topics Concern  . Not on file  Social History Narrative  . Not on file   Family History  Problem Relation Age of Onset  . Cancer Mother   . Heart attack Father   . Diabetes Brother    Scheduled Meds: . chlorhexidine  15 mL  Mouth Rinse BID  . docusate sodium  100 mg Oral Daily  . escitalopram  5 mg Oral Daily  . heparin injection (subcutaneous)  5,000 Units Subcutaneous Q8H  . ipratropium-albuterol  3 mL Nebulization Q6H  . lenalidomide  5 mg Oral QHS  . mouth rinse  15 mL Mouth Rinse q12n4p  . pantoprazole  40 mg Oral Daily  . rOPINIRole  4 mg Oral QHS  . sodium chloride flush  3 mL Intravenous Q12H   Continuous Infusions: . sodium chloride 10 mL/hr at 07/04/18 0600  . ceFEPime (MAXIPIME) IV Stopped (07/03/18 2359)  . dextrose Stopped (07/03/18 1533)  . furosemide (LASIX) infusion 12 mg/hr (07/04/18 1500)  . milrinone 0.25 mcg/kg/min (07/04/18 0500)  . norepinephrine (LEVOPHED) Adult infusion Stopped (07/03/18 1402)   PRN Meds:.sodium chloride, acetaminophen, albuterol, alum & mag hydroxide-simeth, loratadine, sodium chloride, sodium chloride flush Medications Prior to Admission:  Prior to Admission medications   Medication Sig Start Date End Date Taking? Authorizing Provider  acetaminophen (TYLENOL) 650 MG CR tablet Take 1,300 mg by mouth every 8 (eight) hours as needed for pain.   Yes [provider]  allopurinol (ZYLOPRIM) 100 MG tablet Take 2 tablets (200 mg total) by mouth daily. Patient taking differently: Take 200 mg by mouth daily as needed (for gout flares).  05/29/18  Yes Medina-Vargas, Monina C, NP  aspirin EC 81 MG EC tablet Take 1 tablet (81 mg total) by mouth daily. 05/12/18  Yes EAlma Friendly MD  Dextran 70-Hypromellose (ARTIFICIAL TEARS PF OP) Place 2 drops into both eyes 3 (three) times daily as needed (for dryness).   Yes [provider]  diphenhydrAMINE-zinc acetate (BENADRYL) cream Apply topically 2 (two) times daily as needed for itching. 05/11/18  Yes EAlma Friendly MD  dutasteride (AVODART) 0.5 MG capsule Take 1 capsule (0.5 mg total) by mouth daily. 05/29/18  Yes Medina-Vargas, Monina C, NP  escitalopram (LEXAPRO) 5 MG tablet Take 1 tablet (5 mg  total) by mouth at bedtime. 05/29/18  Yes Medina-Vargas, Monina C, NP  furosemide (LASIX) 80 MG tablet Take 1 tablet (80 mg total) by mouth 2 (two) times daily. 05/29/18  Yes Medina-Vargas, Monina C, NP  ipratropium-albuterol (DUONEB) 0.5-2.5 (3) MG/3ML SOLN Take 3 mLs by nebulization every 4 (four) hours as needed. Patient taking differently: Take 3 mLs by nebulization every 4 (four) hours as needed (for wheezing or shortness of breath).  05/29/18  Yes Medina-Vargas, Monina C, NP  loratadine (CLARITIN) 10 MG tablet Take 10 mg by mouth daily as needed for allergies or itching.    Yes [provider]  mirtazapine (REMERON) 15 MG tablet Take 1 tablet (15 mg total) by mouth at bedtime. 05/29/18  Yes Medina-Vargas, Monina C, NP  Multiple Vitamin (MULTIVITAMIN WITH MINERALS) TABS tablet Take 1 tablet by mouth daily.   Yes [provider]  Multiple Vitamins-Minerals (PRESERVISION AREDS 2) CAPS Take 1 capsule by mouth daily.   Yes [provider]  OXYGEN Inhale 3 L into the lungs as needed (for shortness of breath).    Yes [provider]  pantoprazole (PROTONIX) 40 MG tablet Take 1 tablet (40 mg total) by mouth daily. 05/29/18  Yes Medina-Vargas, Monina C, NP  potassium chloride (K-DUR) 10 MEQ tablet Take 1 tablet (10 mEq total) by mouth 2 (two) times daily. 05/29/18  Yes Medina-Vargas, Monina C, NP  REVLIMID 5 MG capsule Take 5 mg by mouth See admin instructions. Take 5 mg by mouth once a day for 21 days, then "off" for 7 days, then repeat 06/16/18  Yes [provider]  rOPINIRole (REQUIP) 4 MG tablet Take 1 tablet (4 mg total) by mouth at bedtime. 05/29/18  Yes Medina-Vargas, Monina C, NP  tamsulosin (FLOMAX) 0.4 MG CAPS capsule Take 1 capsule (0.4 mg total) by mouth daily after breakfast. 05/29/18  Yes Medina-Vargas, Monina C, NP  triamcinolone cream (KENALOG) 0.1 % Apply 1 application topically 2 (two) times daily. Patient taking differently: Apply 1  application topically See admin instructions. Apply to itchy areas two times a day as directed 05/29/18  Yes Medina-Vargas, Monina C, NP  VENTOLIN HFA 108 (90 Base) MCG/ACT inhaler Inhale 2 puffs into the lungs every 6 (six) hours as needed for wheezing or shortness of breath.  06/20/18  Yes [provider]   Allergies  Allergen Reactions  . Vicodin [Hydrocodone-Acetaminophen] Itching and Nausea Only   Review of Systems  Physical Exam  Vital Signs: BP (!) 98/54   Pulse 73   Temp 98 F (36.7 C) (Oral)   Resp 17   Ht '5\' 7"'$  (1.702 m)   Wt 90.9 kg   SpO2 94%   BMI 31.39 kg/m  Pain Scale: Faces POSS *See Group Information*: 1-Acceptable,Awake and alert Pain Score: 0-No pain   SpO2: SpO2: 94 % O2 Device:SpO2: 94 % O2 Flow Rate: .O2 Flow Rate (L/min): 5 L/min  IO: Intake/output summary:   Intake/Output Summary (Last 24 hours) at 07/04/2018 1526 Last data filed at 07/04/2018 1500 Gross per 24 hour  Intake 2114.03 ml  Output 4355 ml  Net -2240.97 ml    LBM: Last BM Date: 07/03/18 Baseline Weight: Weight: 86.2 kg Most recent weight: Weight: 90.9 kg     Palliative Assessment/Data:     Thank you for this consult. Palliative medicine will continue to follow and assist as needed.   Time In: 1300 Time Out: 1430 Time Total: 90 minutes Prolonged services billed: yes Greater than 50%  of this  time was spent counseling and coordinating care related to the above assessment and plan.  Signed by: Mariana Kaufman, AGNP-C Palliative Medicine    Please contact Palliative Medicine Team phone at [phone] for questions and concerns.  For individual provider: See Shea Evans

## 2018-07-05 DIAGNOSIS — Z66 Do not resuscitate: Secondary | ICD-10-CM

## 2018-07-05 DIAGNOSIS — N170 Acute kidney failure with tubular necrosis: Secondary | ICD-10-CM

## 2018-07-05 DIAGNOSIS — Z515 Encounter for palliative care: Secondary | ICD-10-CM

## 2018-07-05 LAB — BASIC METABOLIC PANEL
Anion gap: 6 (ref 5–15)
BUN: 69 mg/dL — AB (ref 8–23)
CALCIUM: 8.3 mg/dL — AB (ref 8.9–10.3)
CO2: 37 mmol/L — ABNORMAL HIGH (ref 22–32)
CREATININE: 1.52 mg/dL — AB (ref 0.61–1.24)
Chloride: 93 mmol/L — ABNORMAL LOW (ref 98–111)
GFR, EST AFRICAN AMERICAN: 48 mL/min — AB (ref >60)
GFR, EST NON AFRICAN AMERICAN: 41 mL/min — AB (ref >60)
Glucose, Bld: 103 mg/dL — ABNORMAL HIGH (ref 70–99)
Potassium: 3.6 mmol/L (ref 3.5–5.1)
SODIUM: 136 mmol/L (ref 135–145)

## 2018-07-05 LAB — CBC WITH DIFFERENTIAL/PLATELET
BAND NEUTROPHILS: 0 %
BASOS ABS: 0 10*3/uL (ref 0.0–0.1)
BLASTS: 0 %
Basophils Relative: 1 %
EOS ABS: 0.1 10*3/uL (ref 0.0–0.5)
Eosinophils Relative: 5 %
HCT: 25.2 % — ABNORMAL LOW (ref 39.0–52.0)
Hemoglobin: 7.5 g/dL — ABNORMAL LOW (ref 13.0–17.0)
LYMPHS ABS: 0.4 10*3/uL — AB (ref 0.7–4.0)
Lymphocytes Relative: 26 %
MCH: 35.4 pg — ABNORMAL HIGH (ref 26.0–34.0)
MCHC: 29.8 g/dL — ABNORMAL LOW (ref 30.0–36.0)
MCV: 118.9 fL — AB (ref 80.0–100.0)
METAMYELOCYTES PCT: 0 %
MONOS PCT: 19 %
MYELOCYTES: 0 %
Monocytes Absolute: 0.3 10*3/uL (ref 0.1–1.0)
NEUTROS ABS: 0.9 10*3/uL — AB (ref 1.7–7.7)
Neutrophils Relative %: 49 %
Other: 0 %
Platelets: 48 10*3/uL — ABNORMAL LOW (ref 150–400)
Promyelocytes Relative: 0 %
RBC: 2.12 MIL/uL — AB (ref 4.22–5.81)
RDW: 19 % — AB (ref 11.5–15.5)
WBC: 1.7 10*3/uL — ABNORMAL LOW (ref 4.0–10.5)
nRBC: 0 % (ref 0.0–0.2)
nRBC: 0 /100 WBC

## 2018-07-05 LAB — GLUCOSE, CAPILLARY
GLUCOSE-CAPILLARY: 101 mg/dL — AB (ref 70–99)
GLUCOSE-CAPILLARY: 122 mg/dL — AB (ref 70–99)
GLUCOSE-CAPILLARY: 127 mg/dL — AB (ref 70–99)
GLUCOSE-CAPILLARY: 274 mg/dL — AB (ref 70–99)

## 2018-07-05 LAB — COOXEMETRY PANEL
CARBOXYHEMOGLOBIN: 2.7 % — AB (ref 0.5–1.5)
Methemoglobin: 1.5 % (ref 0.0–1.5)
O2 SAT: 83.9 %
TOTAL HEMOGLOBIN: 7.9 g/dL — AB (ref 12.0–16.0)

## 2018-07-05 LAB — MAGNESIUM: Magnesium: 2.4 mg/dL (ref 1.7–2.4)

## 2018-07-05 LAB — PHOSPHORUS: PHOSPHORUS: 4.1 mg/dL (ref 2.5–4.6)

## 2018-07-05 MED ORDER — TORSEMIDE 20 MG PO TABS
40.0000 mg | ORAL_TABLET | Freq: Two times a day (BID) | ORAL | Status: DC
  Administered 2018-07-05 – 2018-07-06 (×2): 40 mg via ORAL
  Filled 2018-07-05 (×3): qty 2

## 2018-07-05 MED ORDER — POTASSIUM CHLORIDE 10 MEQ/50ML IV SOLN
10.0000 meq | INTRAVENOUS | Status: AC
  Administered 2018-07-05 (×3): 10 meq via INTRAVENOUS
  Filled 2018-07-05 (×3): qty 50

## 2018-07-05 MED ORDER — POTASSIUM CHLORIDE CRYS ER 20 MEQ PO TBCR
40.0000 meq | EXTENDED_RELEASE_TABLET | Freq: Once | ORAL | Status: AC
  Administered 2018-07-05: 40 meq via ORAL
  Filled 2018-07-05: qty 2

## 2018-07-05 MED ORDER — ACETAZOLAMIDE ER 500 MG PO CP12
500.0000 mg | ORAL_CAPSULE | Freq: Two times a day (BID) | ORAL | Status: AC
  Administered 2018-07-05 (×2): 500 mg via ORAL
  Filled 2018-07-05 (×2): qty 1

## 2018-07-05 MED ORDER — SPIRONOLACTONE 12.5 MG HALF TABLET
12.5000 mg | ORAL_TABLET | Freq: Every day | ORAL | Status: DC
  Administered 2018-07-05 – 2018-07-09 (×5): 12.5 mg via ORAL
  Filled 2018-07-05 (×5): qty 1

## 2018-07-05 MED ORDER — METOLAZONE 2.5 MG PO TABS
2.5000 mg | ORAL_TABLET | Freq: Once | ORAL | Status: AC
  Administered 2018-07-05: 2.5 mg via ORAL
  Filled 2018-07-05: qty 1

## 2018-07-05 NOTE — Progress Notes (Signed)
eLink Physician-Brief Progress Note Patient Name: [name]. DOB: Feb 16, 1932 MRN: [id number]   Date of Service  07/05/2018  HPI/Events of Note  K+ = 3.6 and Creatinine = 1.52. Patient is on a Lasix IV infuion.  eICU Interventions  Will replace      Intervention Category Major Interventions: Electrolyte abnormality - evaluation and management  Sommer,Steven Eugene 07/05/2018, 6:29 AM

## 2018-07-05 NOTE — Progress Notes (Signed)
In initial report, this RN was told patient wishes to be a DNR. During shift change patient's son came out and stated that patient wishes to be a limited code now. RN went in to talk with patient about his wishes and to explain further in depth what a limited code meant, what DNR meant and what being a full code meant. Patient is alert and oriented and told this RN he does not want compressions, but he does want the oxygen support. Patient's daughter present at time of discussion. This RN paged Elink about patient's request.

## 2018-07-05 NOTE — Care Management Note (Signed)
Case Management Note  Patient Details  Name: [name]. MRN: [id number] Date of Birth: 22-Jun-1932  Subjective/Objective:               Respiratory distress, hypoxic, bipap, pneumonia   Action/Plan: PTA home with spouse. [facility] with [facility] RN. Baseline home O2-3 l/m. Will follow for transition of care needs. If patient able to return home, will need resumption of home health services.   Expected Discharge Date:                  Expected Discharge Plan:  [place]  In-House Referral:     Discharge planning Services  CM Consult  Post Acute Care Choice:  Resumption of Svcs/PTA Provider Choice offered to:  Patient, Spouse  DME Arranged:    DME Agency:     HH Arranged:  RN [place] Agency:  [facility]  Status of Service:  In process, will continue to follow  If discussed at Long Length of Stay Meetings, dates discussed:    Additional Comments: 07/05/18 11:56 AM  Patient on O2 at 4 l/m 100%. Home O2 setting 2-3 l/m. Will follow for increased settings for O2 needs. PT recommendations for HHPT. Will need resumption of HHRN and new PT order with face to face.  Pt continues on millirone and lasix drips. Cpap at night which is also per home routine. Palliative consult for goals of care and recommending outpatient palliative at discharge for continued goals of care. Will continue to monitor for transition of care.   Bartholomew Crews, RN 07/05/2018, 11:55 AM

## 2018-07-05 NOTE — Progress Notes (Signed)
   07/05/18 1600  Clinical Encounter Type  Visited With Patient  Visit Type Initial  Referral From Nurse  Consult/Referral To Chaplain  Spiritual Encounters  Spiritual Needs Prayer;Emotional  Stress Factors  Patient Stress Factors Health changes;Major life changes  Responded to spiritual care consult. PT was alert and talkative. He stated that he was in the process of being released soon and stated he wanted to go home. He also shared many stories of his life and family. Also, he stated that he thinks that staff knows that he wants to go home and not to a care facility. He was very thankful for the Chaplain visit and for the time I spent with him. I offered spiritual care with ministry of presence, a listening ear, words of encouragement, and finally, we prayed together. Chaplain available as needed.   Chaplain Fidel Levy  [phone]

## 2018-07-05 NOTE — Progress Notes (Signed)
Physical Therapy Treatment Patient Details Name: Anthony SANTAGATA Sr. MRN: [id number] DOB: [birth year] Today's Date: 07/05/2018    History of Present Illness Pt is an 84 y.o. male admitted 06/27/18 after progressive weakness and SOB. Worked up for acute on chronic hypoxic respiratory failure, multifactorial due to lobar PNA and CHF. PMH includes CKD III, a-fib, COPD (on 3L home O2), OSA on CPAP, CHF, CAD.    PT Comments    Patient unable to progress to safe ambulation this session. Focused on transfers from commode to bed, bed to chair as well as multiple laps of side stepping along bed. Patient fatigues quickly and requires premature sits at time, demonstrating poor balance and stability without hands on guarding during functional mobility. Patient saw earlier this afternoon, but now PT acknowledges d/c orders from MD and will sign off at this time. Please re order if appropriate.   Vitals 115/57 (74) with activity 95/60 (65) at rest  SpO2 WNL 4L    Follow Up Recommendations  SNF     Equipment Recommendations  None recommended by PT    Recommendations for Other Services       Precautions / Restrictions Precautions Precautions: Fall Restrictions Weight Bearing Restrictions: No    Mobility  Bed Mobility Overal bed mobility: Needs Assistance             General bed mobility comments: OOB at entry  Transfers Overall transfer level: Needs assistance Equipment used: Rolling walker (2 wheeled) Transfers: Sit to/from [facility] Sit to Stand: Min assist Stand pivot transfers: Min assist       General transfer comment: min A to stand, provide stability during transfers from commode   Ambulation/Gait                 Stairs             Wheelchair Mobility    Modified Rankin (Stroke Patients Only)       Balance Overall balance assessment: Needs assistance Sitting-balance support: Feet supported;No upper extremity  supported Sitting balance-Leahy Scale: Good     Standing balance support: During functional activity;Single extremity supported Standing balance-Leahy Scale: Poor                              Cognition Arousal/Alertness: Awake/alert Behavior During Therapy: WFL for tasks assessed/performed Overall Cognitive Status: History of cognitive impairments - at baseline Area of Impairment: Safety/judgement;Attention                   Current Attention Level: Selective     Safety/Judgement: Decreased awareness of safety;Decreased awareness of deficits     General Comments: NT formally      Exercises      General Comments        Pertinent Vitals/Pain Pain Assessment: No/denies pain Faces Pain Scale: Hurts even more    Home Living                      Prior Function            PT Goals (current goals can now be found in the care plan section) Acute Rehab PT Goals Patient Stated Goal: Return home to wife as soon as possible PT Goal Formulation: With patient Time For Goal Achievement: 07/12/18 Potential to Achieve Goals: Good Progress towards PT goals: Progressing toward goals    Frequency    Min 3X/week  PT Plan Current plan remains appropriate    Co-evaluation              AM-PAC PT "6 Clicks" Mobility   Outcome Measure  Help needed turning from your back to your side while in a flat bed without using bedrails?: A Lot Help needed moving from lying on your back to sitting on the side of a flat bed without using bedrails?: A Lot Help needed moving to and from a bed to a chair (including a wheelchair)?: A Lot Help needed standing up from a chair using your arms (e.g., wheelchair or bedside chair)?: A Lot Help needed to walk in hospital room?: A Lot Help needed climbing 3-5 steps with a railing? : A Lot 6 Click Score: 12    End of Session Equipment Utilized During Treatment: Oxygen Activity Tolerance: Patient tolerated  treatment well Patient left: in chair;with call bell/phone within reach;with chair alarm set Nurse Communication: Mobility status PT Visit Diagnosis: Other abnormalities of gait and mobility (R26.89)     Time: 1425-1505 PT Time Calculation (min) (ACUTE ONLY): 40 min  Charges:  $Therapeutic Activity: 23-37 mins                    Reinaldo Berber, PT, DPT Acute Rehabilitation Services Pager: [phone] Office: [place] 07/05/2018, 5:47 PM

## 2018-07-05 NOTE — Progress Notes (Signed)
After further discussion with patient, patient's wife and Elink MD. Patient wants MEDS AND BIPAP ONLY. NO COMPRESSIONS, NO DEFIBRILLATION, NO INTUBATION.  Margaret Pyle, RN

## 2018-07-05 NOTE — Progress Notes (Addendum)
eLink Physician-Brief Progress Note Patient Name: Anthony KEETCH Sr. DOB: [birth year] MRN: [id number]   Date of Service  07/05/2018  HPI/Events of Note  Clarification about code status. Discussed with daughter and son at bedside.  eICU Interventions  Patient wishes to remain in Do Not Resuscitate status but would want to be placed on BiPap if need be. No ACLS meds as I explained to them that this is futile if CPR is not going to be performed but ok with pressors and inotropes if BP or cardiac output low.     Intervention Category Evaluation Type: Other  Judd Lien 07/05/2018, 11:00 PM

## 2018-07-05 NOTE — Progress Notes (Signed)
This encounter was created in error - please disregard.

## 2018-07-05 NOTE — Progress Notes (Signed)
NAME:  Anthony Easom., MRN:  [id number], DOB:  01-02-1932, LOS: 8 ADMISSION DATE:  06/27/2018, CONSULTATION DATE:  11/23 REFERRING MD:  Dr. [name], CHIEF COMPLAINT:  SOB   Brief History    84 year old male with past medical history as below, which is significant for COPD (on 3 L supplemental oxygen at home), systolic congestive heart failure with recent ejection fraction 25 to 30%, CKD stage III and myelodysplasia.  He was admitted to [facility] on 11/19 with complaints of several days shortness of breath and progressive weakness.  Imaging upon admission was concerning for pneumonia and pleural effusion.  He was admitted for treatment of presumed pneumonia and treated with empiric antibiotics.  Inpatient course became complicated by worsening dyspnea which improved with IV diuretics.  His blood pressure remained borderline so he was difficult to diurese.  On 11/23 cardiology was consulted for further evaluation who felt he had low cardiac output despite hypervolemia.  IV milrinone was started in hopes that he would be able to be diuresed.  He was transferred to ICU for continued shortness of breath requiring BiPAP and the initiation of milrinone infusion.  PCCM was asked to evaluate.   Significant Hospital Events   11/19 admit 11/23 ICU transfer  Consults:  Cardiology 11/23  Procedures:    Significant Diagnostic Tests:  Echo 11/25 > EF 25%, left ventricle severely dilated systolic function was severely reduced.  There is akiiness changes of the lateral inferior myocardium.  Questionable of possible pericardial cyst.  Micro Data:  Blood 11/19 > neg.  Antimicrobials:  Cefepime 11/19 > (stop date planned 11/27) Vanco 11/19 > 11/20  Interim history/subjective:  -4 L for 48 hours Continue diuresis May be able to transition out of the intensive care unit and to stepdown unit and to [facility] service  Objective   Blood pressure (!) 98/55, pulse 70, temperature (!) 96.8 F  (36 C), temperature source Axillary, resp. rate 14, height 5\' 7"  (1.702 m), weight 90.7 kg, SpO2 97 %. CVP:  [10 mmHg-14 mmHg] 14 mmHg  FiO2 (%):  [30 %] 30 %   Intake/Output Summary (Last 24 hours) at 07/05/2018 0829 Last data filed at 07/05/2018 0600 Gross per 24 hour  Intake 1925.1 ml  Output 3955 ml  Net -2029.9 ml   Filed Weights   07/04/18 0500 07/04/18 1100 07/05/18 0449  Weight: 95.7 kg 90.9 kg 90.7 kg   PHYSICAL EXAM: General: Fully male no acute distress at rest HEENT: No JVD lymphadenopathy is appreciated Neuro: Somewhat stunned but follows commands and answers questions CV: Heart sounds are irregular PULM: Decreased air movement, diminished throughout AS:NKNL, non-tender, bsx4 active  Extremities: warm/dry, 1+ edema  Skin: no rashes or lesions    Assessment & Plan:   Acute on chronic hypercarbic respiratory failure: in the setting of PNA, CHF, and decompensated OSA (hx noncompliance with CPAP). -Continue nocturnal BiPAP  COPD without acute exacerbation -Continue bronchodilators -Oxygen as needed  Shock, presumed primary cardiogenic in the setting of Acute on chronic HFrEF (25-30% EF with baseline SBP in mid to high 90s)  +/- septic from possible HCAP (cultures have remained negative). -Continue milrinone and Lasix drip per cardiology -Echo performed 1126 shows EF 25% -Also reports she is on a candidate for advanced therapies  Atrial fibrillation no longer on anticoagulation due to bleeding risk.  "Controlled ventricular rate -Monitor  Possible HCAP - cultures negative. Cefepime discontinued after total of 8 days  AKI on CKD III - improving with diuresis. -  Lasix drip as recommended by cardiology currently not  requiring vasopressor support. -Follow BMP replete electrolytes -Not appear to require hemodialysis at this time  Myelodysplasia low grade. Followed by Dr. Marin Olp. Pancytopenia -Continue preadmission medications  ? Melena overnight 11/25 -  FOBT remains pending 07/05/2018, hemoglobin 7.9 -Monitor transfuse per protocol -GI consult may be needed in the near future  Best practice:  Diet: Heart healthy. Pain/Anxiety/Delirium protocol (if indicated): n/a VAP protocol (if indicated): n/a DVT prophylaxis: sq heparin GI prophylaxis: protonix Glucose control: none. Mobility: Bedrest Code Status: FULL Family Communication: 07/05/2018 and updated bedside no family at bedside. Disposition: ICU.  CC time: 30 min.   Richardson Landry Minor ACNP Maryanna Shape PCCM Pager [phone] till 1 pm If no answer page [phone] 07/05/2018, 8:29 AM

## 2018-07-05 NOTE — Progress Notes (Signed)
Chaplain ET noted spiritual care consult for Pt. dated 11/26.  In an effort to not leave the consult unattended, Chaplain ET referred the consult to Chaplain AM.

## 2018-07-05 NOTE — Progress Notes (Signed)
  Discussed with Dr. Aundra Dubin.   With continued downtrend of Hgb and Plts, will stop Sub-Q heparin for now.   Full note to follow.   Legrand Como [address] [place], PA-C 07/05/2018 7:12 AM

## 2018-07-05 NOTE — Progress Notes (Signed)
Daily Progress Note   Patient Name: Anthony LIEW Sr.       Date: 07/05/2018 DOB: 05-01-1932  Age: 84 y.o. MRN#: [id number] Attending Physician: Kipp Brood, MD Primary Care Physician: [name], MD Admit Date: 06/27/2018  Reason for Consultation/Follow-up: Establishing goals of care and Psychosocial/spiritual support  Subjective: Talked with CCM attending then met with patient at bedside.  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  He has 3 daughters and 1 son.  Dtr [name] lives next to him and tries to assist when she can.  [name] has to care for his wife.  He speaks of helping her toilet in the middle of the night.  We discussed their increased need for in home care.  We also discussed code status.  I expressed to [name] my concern that given his thrombocytopenia and poor heart function - if he arrested, heroic measures would cause more harm than good.  Heroic measures would make his death traumatic rather than peaceful.  [name] agreed he does not want to burden his wife or children with a code / life support.     I left a message for daughter [name] that her father agreed with not attempting resuscitation should he arrest.   Assessment:   Patient improving (for the short term) and transferring out of the ICU.  Over all status is unchanged.  Unfortunately due to heart failure, respiratory failure and severe deconditioning he will decline quickly when care is de-escalated.   Patient Profile/HPI:  84 y.o. male  with past medical history of CHF with multiple hospitalizations, s/p CRT-P placement, MDS (on Revlimid), CKD, HTN admitted on 06/27/2018 with acute on chronic CHF exacerbation, cardiogenic/septic shock, PNA. During admission he  required bipap and pressors, but now is off pressors and on nasal cannula oxygen. He wears a CPAP at night. He is on milrinone and continuous IV lasix infusion for diuresis. Creatinine was up but is trending down. Palliative medicine consulted for [place].   Length of Stay: 8  Current Medications: Scheduled Meds:  . chlorhexidine  15 mL Mouth Rinse BID  . docusate sodium  100 mg Oral Daily  . escitalopram  5 mg Oral Daily  . ipratropium-albuterol  3 mL Nebulization Q6H  . lenalidomide  5 mg  Oral QHS  . mouth rinse  15 mL Mouth Rinse q12n4p  . pantoprazole  40 mg Oral Daily  . rOPINIRole  4 mg Oral QHS  . sodium chloride flush  3 mL Intravenous Q12H  . spironolactone  12.5 mg Oral Daily    Continuous Infusions: . sodium chloride 10 mL/hr at 07/05/18 1400  . dextrose Stopped (07/03/18 1533)  . furosemide (LASIX) infusion 12 mg/hr (07/05/18 1400)  . milrinone 0.25 mcg/kg/min (07/05/18 1012)  . norepinephrine (LEVOPHED) Adult infusion Stopped (07/03/18 1402)    PRN Meds: sodium chloride, acetaminophen, albuterol, alum & mag hydroxide-simeth, loratadine, sodium chloride, sodium chloride flush  Physical Exam  Well developed, elderly gentleman, awake, alert, standing with physical therapy.  Coherent cooperative.   Vital Signs: BP (!) 95/50   Pulse 70   Temp 98.1 F (36.7 C) (Oral)   Resp 16   Ht _0  (1.702 m)   Wt 90.7 kg   SpO2 99%   BMI 31.32 kg/m  SpO2: SpO2: 99 % O2 Device: O2 Device: Nasal Cannula O2 Flow Rate: O2 Flow Rate (L/min): 4 L/min  Intake/output summary:   Intake/Output Summary (Last 24 hours) at 07/05/2018 1457 Last data filed at 07/05/2018 1400 Gross per 24 hour  Intake 1012.02 ml  Output 3455 ml  Net -2442.98 ml   LBM: Last BM Date: 07/03/18 Baseline Weight: Weight: 86.2 kg Most recent weight: Weight: 90.7 kg       Palliative Assessment/Data:  40%      Patient Active Problem List   Diagnosis Date Noted  . PNA (pneumonia) 06/27/2018  .  Lethargy 05/16/2018  . MDS (myelodysplastic syndrome), high grade ([place]) 05/11/2018  . Advanced care planning/counseling discussion   . Goals of care, counseling/discussion   . Palliative care by specialist   . AKI (acute kidney injury) ([place])   . Acute on chronic combined systolic and diastolic CHF (congestive heart failure) ([place]) 05/03/2018  . Petechial rash 05/03/2018  . Biventricular cardiac pacemaker  st [place]es  dual chamber with LV in atrial port  03/29/2018  . Congestive heart failure, NYHA class 3, chronic, systolic ([place]) 38/88/2800  . GI bleed 11/03/2017  . Acute renal failure superimposed on stage 3 chronic kidney disease ([place]) 11/03/2017  . Chest pain 11/03/2017  . Pancytopenia ([place]) 09/26/2017  . CKD (chronic kidney disease), stage III ([place]) 09/26/2017  . Chronic pain 09/26/2017  . Acute on chronic respiratory failure with hypoxia ([place]) 09/26/2017  . Right shoulder injury, initial encounter 04/08/2017  . Chronic atrial fibrillation 11/23/2016  . Secondary hypercoagulable state ([place])   . Transaminitis   . PAH (pulmonary artery hypertension) ([place])   . Chronic systolic heart failure ([place]) 09/09/2015  . Poor short term memory 11/05/2013  . Fatigue 10/13/2011  . Dyspnea on exertion 10/13/2011  . Restless leg syndrome 10/13/2011  . Chronic anticoagulation 10/13/2011  . Nonischemic cardiomyopathy - coronaries by angiography 2010 10/13/2011  . OSA on CPAP 10/13/2011  . Tremor, hereditary, benign 10/13/2011  . Depression 10/13/2011  . Other secondary pulmonary hypertension ([place]) 10/13/2011  . MRSA (methicillin resistant Staphylococcus aureus) carrier 10/13/2011  . BPH (benign prostatic hyperplasia) 10/13/2011    Palliative Care Plan    Recommendations/Plan:  Changed code status to DNR  Patient is eligible for hospice services in the home if he does not DC with infusions.  Its sounds as though he and his wife will need 24 hour care in the home.  PMT will follow  intermittently over the long weekend.  Please call us if we need to be involved sooner.  Goals of Care and Additional Recommendations:  Limitations on Scope of Treatment: DNR with full scope treatment  Code Status:  DNR  Prognosis:   < 6 months in the setting of advanced heart failure, CKD 3, respiratory failure, severe deconditioning.   Discharge Planning:  To Be Determined  Care plan was discussed with CCM attending.  Thank you for allowing the Palliative Medicine Team to assist in the care of this patient.  Total time spent:  35 min.     Greater than 50%  of this time was spent counseling and coordinating care related to the above assessment and plan.  Florentina Jenny, PA-C Palliative Medicine  Please contact Palliative MedicineTeam phone at [phone] for questions and concerns between 7 am - 7 pm.   Please see AMION for individual provider pager numbers.

## 2018-07-05 NOTE — Progress Notes (Addendum)
Advanced Heart Failure Rounding Note  PCP-Cardiologist: [name], MD   Subjective:    Coox 83.9% on milrinone 0.25 mcg/kg/min   Negative 2.3 L and weight down 1 lb.   More alert this am. Denies SOB at rest. Slept with CPAP on. Had BM yesterday, but didn't notice if dark or bloody.   Cr 1.86 -> 1.52 -> 1.52 on diuresis.   Echo 07/04/18 LVEF 25-30%, Mild MR, Severe LAE, Severe RAE, Mild/Mod TR, Mod PI, PA peak pressure 59 mm Hg.   "5x5 cm cystic structure" noted. NOT seen on f/u CT.   Objective:   Weight Range: 90.7 kg Body mass index is 31.32 kg/m.   Vital Signs:   Temp:  [96.8 F (36 C)-98.3 F (36.8 C)] 96.8 F (36 C) (11/27 0723) Pulse Rate:  [70-77] 70 (11/27 0630) Resp:  [11-25] 14 (11/27 0630) BP: (84-130)/(46-71) 98/55 (11/27 0630) SpO2:  [83 %-100 %] 97 % (11/27 0630) FiO2 (%):  [30 %] 30 % (11/27 0600) Weight:  [90.7 kg-90.9 kg] 90.7 kg (11/27 0449) Last BM Date: 07/03/18  Weight change: Filed Weights   07/04/18 0500 07/04/18 1100 07/05/18 0449  Weight: 95.7 kg 90.9 kg 90.7 kg    Intake/Output:   Intake/Output Summary (Last 24 hours) at 07/05/2018 0734 Last data filed at 07/05/2018 0600 Gross per 24 hour  Intake 1953.99 ml  Output 4255 ml  Net -2301.01 ml    Physical Exam    CVP 12-13  General: Elderly and chronically ill appearing. NAD. appearing. No resp difficulty. HEENT: Normal, occasional pursed lip breathing.  Neck: Supple. JVP 12-13 cm. Carotids 2+ bilat; no bruits. No thyromegaly or nodule noted. Cor: PMI nondisplaced. RRR, No M/G/R noted Lungs: Diminished throughout.  Abdomen: Soft, non-tender, non-distended, no HSM. No bruits or masses. +BS  Extremities: No cyanosis, clubbing, or rash. Trace to 1+ edema.  Neuro: Alert & orientedx3, cranial nerves grossly intact. moves all 4 extremities w/o difficulty. Affect flat but appropriate.   Telemetry   BiV paced 70s, personally reviewed.   EKG    No new tracings.    Labs    CBC Recent Labs    07/04/18 0336 07/05/18 0457  WBC 2.7* 1.7*  NEUTROABS 1.6* 0.9*  HGB 7.9* 7.5*  HCT 25.9* 25.2*  MCV 117.2* 118.9*  PLT 57* 48*   Basic Metabolic Panel Recent Labs    07/04/18 0336 07/05/18 0457  NA 135 136  K 4.1 3.6  CL 94* 93*  CO2 34* 37*  GLUCOSE 93 103*  BUN 74* 69*  CREATININE 1.52* 1.52*  CALCIUM 8.5* 8.3*  MG 2.5* 2.4  PHOS 4.7* 4.1   Liver Function Tests No results for input(s): AST, ALT, ALKPHOS, BILITOT, PROT, ALBUMIN in the last 72 hours. No results for input(s): LIPASE, AMYLASE in the last 72 hours. Cardiac Enzymes No results for input(s): CKTOTAL, CKMB, CKMBINDEX, TROPONINI in the last 72 hours.  BNP: BNP (last 3 results) Recent Labs    01/12/18 0306 05/03/18 0406 06/27/18 1910  BNP 1,360.9* 1,865.0* 1,678.1*    ProBNP (last 3 results) No results for input(s): PROBNP in the last 8760 hours.   D-Dimer No results for input(s): DDIMER in the last 72 hours. Hemoglobin A1C No results for input(s): HGBA1C in the last 72 hours. Fasting Lipid Panel No results for input(s): CHOL, HDL, LDLCALC, TRIG, CHOLHDL, LDLDIRECT in the last 72 hours. Thyroid Function Tests No results for input(s): TSH, T4TOTAL, T3FREE, THYROIDAB in the last 72 hours.  Invalid  input(s): FREET3  Other results:   Imaging    Ct Chest Wo Contrast  Result Date: 07/04/2018 CLINICAL DATA:  84 year old male with history of cystic structure posterior to the left ventricle in left atrium noted on recent echocardiogram. EXAM: CT CHEST WITHOUT CONTRAST TECHNIQUE: Multidetector CT imaging of the chest was performed following the standard protocol without IV contrast. COMPARISON:  None. FINDINGS: Cardiovascular: Heart size is enlarged with left ventricular and left atrial dilatation. There is no significant pericardial fluid, thickening or pericardial calcification. There is aortic atherosclerosis, as well as atherosclerosis of the great vessels of the mediastinum  and the coronary arteries, including calcified atherosclerotic plaque in the left main, left anterior descending, left circumflex and right coronary arteries. Calcifications of the aortic valve. Left-sided biventricular pacemaker with lead tips terminating in the right ventricular apex and overlying the anterior wall of the left ventricle via the coronary sinus and coronary veins. Dilatation of the pulmonic trunk (4.1 cm in diameter). Mediastinum/Nodes: No pathologically enlarged mediastinal or hilar lymph nodes. Please note that accurate exclusion of hilar adenopathy is limited on noncontrast CT scans. Esophagus is unremarkable in appearance. No axillary lymphadenopathy. Lungs/Pleura: Small to moderate bilateral pleural effusions lying dependently. Associated passive subsegmental atelectasis throughout the dependent portions of the lung bases bilaterally. A few patchy areas of ground-glass attenuation are also noted in the dependent portions of the upper lobes of the lungs bilaterally. No definite suspicious appearing pulmonary nodules or masses are noted. Upper Abdomen: Aortic atherosclerosis. Musculoskeletal: There are no aggressive appearing lytic or blastic lesions noted in the visualized portions of the skeleton. IMPRESSION: 1. No cystic structure identified in the lower left hemithorax to account for the perceived abnormality on the recent echocardiogram. 2. Small to moderate bilateral pleural effusions lying dependently. Extensive areas of passive atelectasis or noted throughout the dependent portions of the lungs bilaterally. 3. Additional patchy areas of ground-glass attenuation in the dependent portions of the upper lobes of the lungs bilaterally. This is nonspecific, and could be infectious or inflammatory in etiology. 4. Dilatation of the pulmonic trunk (4.1 cm in diameter), concerning for pulmonary arterial hypertension. 5. Cardiomegaly with left ventricular and left atrial dilatation. Aortic  Atherosclerosis (ICD10-I70.0). Electronically Signed   By: Vinnie Langton M.D.   On: 07/04/2018 16:27     Medications:     Scheduled Medications: . chlorhexidine  15 mL Mouth Rinse BID  . docusate sodium  100 mg Oral Daily  . escitalopram  5 mg Oral Daily  . ipratropium-albuterol  3 mL Nebulization Q6H  . lenalidomide  5 mg Oral QHS  . mouth rinse  15 mL Mouth Rinse q12n4p  . pantoprazole  40 mg Oral Daily  . rOPINIRole  4 mg Oral QHS  . sodium chloride flush  3 mL Intravenous Q12H    Infusions: . sodium chloride 10 mL/hr at 07/05/18 0600  . dextrose Stopped (07/03/18 1533)  . furosemide (LASIX) infusion 12 mg/hr (07/05/18 0600)  . milrinone 0.25 mcg/kg/min (07/04/18 1851)  . norepinephrine (LEVOPHED) Adult infusion Stopped (07/03/18 1402)  . potassium chloride 10 mEq (07/05/18 0652)    PRN Medications: sodium chloride, acetaminophen, albuterol, alum & mag hydroxide-simeth, loratadine, sodium chloride, sodium chloride flush    Patient Profile   Anthony KOUDELKA Sr. is a 84 y.o. malewith a hx of PMH of CKD stage III, afib, HTN, HLD, MDS, NICM, symptomatic bradycardia s/p St Jude CRT-P, COPD on home O2, OSA, RLS and pulmonary HTN  Admitted with worsening weakness and DOE.  Found to have PNA and acute on chronic systolic CHF.   Assessment/Plan   1. Acute on chronic systolic CHF: Echo 8/41 with EF 25-30%. St Jude CRT-P. He is volume overloaded on exam, now on milrinone 0.25.  UOP improved and Cr stable at 1.8.  Echo 07/04/18 LVEF 25-30%, Mild MR, Severe LAE, Severe RAE, Mild/Mod TR, Mod PI, PA peak pressure 59 mm Hg.   "5x5 cm cystic structure" noted. NOT seen on f/u CT.  - CVP 12-13 cm. - Coox 83.9% on milrinone 0.25 mcg/kg/min.  - Off norepinephrine.  - Continue lasix gtt 12 mg/hr. Will give metolazone 2.5 mg x 1 and extra 40 meq of K.  - Not candidate for advanced therapies with age and other comorbidities.  Will try to manage volume with milrinone, will try to titrate  off eventually.  - Will have St Jude check device, make sure she is BiV pacing appropriately => done, he is BiV pacing appropriately.  - May need to consider cath with worsening EF.  2. Atrial fibrillation: Chronic - He has not been anticoagulated due to AVM bleeding.  No change.  3. ID: - Covering with cefepime for PNA => completed course. Afebrile.  4. AKI on CKD stage 3: - Creatinine 1.8 -> 1.52 -> 1.52. Follow.  - Continue to support cardiac output with pressors/inotropes.  He would be a poor HD candidate.  5. Myelodysplastic syndrome: - Pancytopenia.  6. Thrombocytopenia - In setting of #5. Hold Sub Q heparin for now.  7. Anemia - FOBT pending. Hgb down slightly. Hold heparin. Further per primary. Consider GI consult. Chronic component.  8. Disposition - Palliative care following. Remains full code.   Medication concerns reviewed with patient and pharmacy team. Barriers identified: None at this time.   Length of Stay: [address]  [name]  07/05/2018, 7:34 AM  Advanced Heart Failure Team Pager [phone] (M-F; 7a - 4p)  Please contact [facility] for night-coverage after hours (4p -7a ) and weekends on [email or web address]  Patient seen with PA, agree with the above note. CVP 12-13, co-ox 84% on milrinone 0.25. He is not on norepinephrine.  SBP 90s. He completed cefepime for ?PNA, CT chest yesterday did not show definite PNA.  Still reports dyspnea.   On exam, JVP 12-13 cm. Seems to be breathing more comfortably.  Decreased BS at bases. Regular S1S2. No edema.   1. Acute on chronic systolic CHF: Echo 2/72 with EF 25-30%, same on echo this admission. St Jude CRT-P, he has been BiV pacing appropriately by device interrogation. Now on milrinone 0.25, not on norepinephrine. Good UOP yesterday on Lasix 12 mg/hr with creatinine stable at 1.52. CVP 12-13 today with co-ox 84%.  Still short of breath.  - Will continue milrinone 0.25.  - Continue Lasix 12 mg/hr today and will  give a dose of metolazone 2.5 x 1. - With improved creatinine, will add low dose spironolactone 12.5 daily.   - Not candidate for advanced therapies with age and other comorbidities. Will try to manage volume with milrinone, will try to titrate off eventually.  - He has had a significant fall in EF from the past, but with MDS/pancytopenia with platelets in the 40s and elevated creatinine, would hold off on coronary angiography.  2. Atrial fibrillation: Chronic. He has not been anticoagulated due to AVM bleeding and MDS with anemia/thrombocytopenia. Hgb 7.9 => 7.5.  3. ID: Possible PNA. He has completed course of cefepime.  PNA was not seen on yesterday's  chest CT.  4. AKI on CKD stage 3:  Will support cardiac output with pressors/inotropes. Creatinine has stabilized, down to 1.52.  He would be a poor HD candidate.  5. Myelodysplastic syndrome: Pancytopenia, plts down to 48K today.  - Hold DVT heparin, can use SCDs.  6. COPD: On home oxygen at baseline.  7. OSA: CPAP at night.   Palliative care following.  He is thinking about DNR.   [name] 07/05/2018 8:35 AM

## 2018-07-06 ENCOUNTER — Inpatient Hospital Stay (HOSPITAL_COMMUNITY): Payer: Medicare Other

## 2018-07-06 ENCOUNTER — Other Ambulatory Visit: Payer: Self-pay | Admitting: Hematology & Oncology

## 2018-07-06 LAB — CBC WITH DIFFERENTIAL/PLATELET
BAND NEUTROPHILS: 0 %
BASOS ABS: 0 10*3/uL (ref 0.0–0.1)
Basophils Relative: 1 %
Blasts: 0 %
EOS ABS: 0.1 10*3/uL (ref 0.0–0.5)
EOS PCT: 5 %
HCT: 25.3 % — ABNORMAL LOW (ref 39.0–52.0)
HEMOGLOBIN: 7.8 g/dL — AB (ref 13.0–17.0)
LYMPHS ABS: 0.6 10*3/uL — AB (ref 0.7–4.0)
Lymphocytes Relative: 32 %
MCH: 36.4 pg — ABNORMAL HIGH (ref 26.0–34.0)
MCHC: 30.8 g/dL (ref 30.0–36.0)
MCV: 118.2 fL — ABNORMAL HIGH (ref 80.0–100.0)
METAMYELOCYTES PCT: 0 %
MONO ABS: 0.3 10*3/uL (ref 0.1–1.0)
MYELOCYTES: 1 %
Monocytes Relative: 17 %
Neutro Abs: 0.8 10*3/uL — ABNORMAL LOW (ref 1.7–7.7)
Neutrophils Relative %: 44 %
Other: 0 %
PLATELETS: 39 10*3/uL — AB (ref 150–400)
PROMYELOCYTES RELATIVE: 0 %
RBC: 2.14 MIL/uL — ABNORMAL LOW (ref 4.22–5.81)
RDW: 18.6 % — ABNORMAL HIGH (ref 11.5–15.5)
WBC: 1.8 10*3/uL — ABNORMAL LOW (ref 4.0–10.5)
nRBC: 0 /100 WBC
nRBC: 1.1 % — ABNORMAL HIGH (ref 0.0–0.2)

## 2018-07-06 LAB — COOXEMETRY PANEL
Carboxyhemoglobin: 2.4 % — ABNORMAL HIGH (ref 0.5–1.5)
Carboxyhemoglobin: 2.2 % — ABNORMAL HIGH (ref 0.5–1.5)
METHEMOGLOBIN: 1.8 % — AB (ref 0.0–1.5)
Methemoglobin: 1.4 % (ref 0.0–1.5)
O2 SAT: 60.6 %
O2 Saturation: 85.5 %
Total hemoglobin: 7.9 g/dL — ABNORMAL LOW (ref 12.0–16.0)
Total hemoglobin: 8.8 g/dL — ABNORMAL LOW (ref 12.0–16.0)

## 2018-07-06 LAB — BASIC METABOLIC PANEL
Anion gap: 8 (ref 5–15)
BUN: 63 mg/dL — ABNORMAL HIGH (ref 8–23)
CALCIUM: 8.4 mg/dL — AB (ref 8.9–10.3)
CO2: 39 mmol/L — ABNORMAL HIGH (ref 22–32)
Chloride: 90 mmol/L — ABNORMAL LOW (ref 98–111)
Creatinine, Ser: 1.49 mg/dL — ABNORMAL HIGH (ref 0.61–1.24)
GFR, EST AFRICAN AMERICAN: 49 mL/min — AB (ref >60)
GFR, EST NON AFRICAN AMERICAN: 42 mL/min — AB (ref >60)
Glucose, Bld: 84 mg/dL (ref 70–99)
Potassium: 3.6 mmol/L (ref 3.5–5.1)
SODIUM: 137 mmol/L (ref 135–145)

## 2018-07-06 LAB — MAGNESIUM: MAGNESIUM: 2.2 mg/dL (ref 1.7–2.4)

## 2018-07-06 LAB — PHOSPHORUS: PHOSPHORUS: 3.3 mg/dL (ref 2.5–4.6)

## 2018-07-06 MED ORDER — TORSEMIDE 20 MG PO TABS
60.0000 mg | ORAL_TABLET | Freq: Two times a day (BID) | ORAL | Status: DC
  Administered 2018-07-06 – 2018-07-08 (×4): 60 mg via ORAL
  Filled 2018-07-06 (×6): qty 3

## 2018-07-06 MED ORDER — PREDNISONE 20 MG PO TABS
30.0000 mg | ORAL_TABLET | Freq: Every day | ORAL | Status: DC
  Administered 2018-07-06: 30 mg via ORAL
  Filled 2018-07-06: qty 1

## 2018-07-06 NOTE — Progress Notes (Signed)
Daily Progress Note   Patient Name: Anthony MARCIL Sr.       Date: 07/06/2018 DOB: [birth year]  Age: 84 y.o. MRN#: [id number] Attending Physician: Kipp Brood, MD Primary Care Physician: [name], MD Admit Date: 06/27/2018  Reason for Consultation/Follow-up: Establishing goals of care, Non pain symptom management, Pain control and Psychosocial/spiritual support  Subjective: Spoke with RN and Heart Failure MD prior to entering the room.  Per RN patient has an excellent appetite but is very weak.  Dr. Haroldine Laws offered an objective basis on which to make care decisions going forward - which was very helpful.  I spoke briefly with [name].  He is sleepy and confused, but appears comfortable.  He is very pleasant to speak with.  I called [name] on the phone.  We discussed her father's case, the family's reaction to DNR/code status, and made a plan to meet tomorrow.  [name] was thankful for the discussion with her father and brother over code status last night - it opened up a conversation that they were hesitant to discuss otherwise.  [name] is in favor of Hospice Services at home no matter what the outcome of this hospitalization.     Assessment: 84 yo male.  Sleepy and mildly confused at the moment. Co Ox has dropped today.  Will look at labs again tomorrow to see where we are headed. Currently  Partial code - allowing for Bipap and pressors.   Patient Profile/HPI:  84 y.o.malewith past medical history of CHF with multiple hospitalizations, s/p CRT-P placement, MDS (on Revlimid), CKD, HTNadmitted on 11/19/2019with acute on chronic CHF exacerbation, cardiogenic/septic shock, PNA.During admission he required bipap and pressors, but now is off pressors and on nasal cannula oxygen. He  wears a CPAP at night. He is on milrinone and continuous IV lasix infusion for diuresis. Creatinine was up but is trending down. Palliative medicine consulted for [place].    Length of Stay: 9  Current Medications: Scheduled Meds:  . chlorhexidine  15 mL Mouth Rinse BID  . docusate sodium  100 mg Oral Daily  . escitalopram  5 mg Oral Daily  . ipratropium-albuterol  3 mL Nebulization Q6H  . lenalidomide  5 mg Oral QHS  . mouth rinse  15 mL Mouth Rinse q12n4p  . pantoprazole  40 mg Oral Daily  .  predniSONE  30 mg Oral Q breakfast  . rOPINIRole  4 mg Oral QHS  . sodium chloride flush  3 mL Intravenous Q12H  . spironolactone  12.5 mg Oral Daily  . torsemide  60 mg Oral BID    Continuous Infusions: . sodium chloride 10 mL/hr at 07/06/18 1500  . dextrose Stopped (07/03/18 1533)    PRN Meds: sodium chloride, acetaminophen, albuterol, alum & mag hydroxide-simeth, loratadine, sodium chloride, sodium chloride flush  Physical Exam        Well developed elderly male, very weak appearing. Sleepy, comfortable.  Vital Signs: BP (!) 85/49   Pulse 70   Temp 97.7 F (36.5 C) (Oral)   Resp 15   Ht 5\' 7"  (1.702 m)   Wt 90.5 kg   SpO2 96%   BMI 31.25 kg/m  SpO2: SpO2: 96 % O2 Device: O2 Device: Nasal Cannula O2 Flow Rate: O2 Flow Rate (L/min): 2 L/min  Intake/output summary:   Intake/Output Summary (Last 24 hours) at 07/06/2018 1508 Last data filed at 07/06/2018 1500 Gross per 24 hour  Intake 765.51 ml  Output 4725 ml  Net -3959.49 ml   LBM: Last BM Date: 07/05/18 Baseline Weight: Weight: 86.2 kg Most recent weight: Weight: 90.5 kg       Palliative Assessment/Data: 20%      Patient Active Problem List   Diagnosis Date Noted  . Palliative care encounter   . DNR (do not resuscitate)   . PNA (pneumonia) 06/27/2018  . Lethargy 05/16/2018  . MDS (myelodysplastic syndrome), high grade ([place]) 05/11/2018  . Advanced care planning/counseling discussion   . Goals of care,  counseling/discussion   . Palliative care by specialist   . AKI (acute kidney injury) ([place])   . Acute on chronic combined systolic and diastolic CHF (congestive heart failure) ([place]) 05/03/2018  . Petechial rash 05/03/2018  . Biventricular cardiac pacemaker  st Judes  dual chamber with LV in atrial port  03/29/2018  . Congestive heart failure, NYHA class 3, chronic, systolic ([place]) 37/05/6268  . GI bleed 11/03/2017  . Acute renal failure superimposed on stage 3 chronic kidney disease ([place]) 11/03/2017  . Chest pain 11/03/2017  . Pancytopenia ([place]) 09/26/2017  . CKD (chronic kidney disease), stage III ([place]) 09/26/2017  . Chronic pain 09/26/2017  . Acute on chronic respiratory failure with hypoxia ([place]) 09/26/2017  . Right shoulder injury, initial encounter 04/08/2017  . Chronic atrial fibrillation 11/23/2016  . Secondary hypercoagulable state ([place])   . Transaminitis   . PAH (pulmonary artery hypertension) ([place])   . Chronic systolic heart failure ([place]) 09/09/2015  . Poor short term memory 11/05/2013  . Fatigue 10/13/2011  . Dyspnea on exertion 10/13/2011  . Restless leg syndrome 10/13/2011  . Chronic anticoagulation 10/13/2011  . Nonischemic cardiomyopathy - coronaries by angiography 2010 10/13/2011  . OSA on CPAP 10/13/2011  . Tremor, hereditary, benign 10/13/2011  . Depression 10/13/2011  . Other secondary pulmonary hypertension ([place]) 10/13/2011  . MRSA (methicillin resistant Staphylococcus aureus) carrier 10/13/2011  . BPH (benign prostatic hyperplasia) 10/13/2011    Palliative Care Plan    Recommendations/Plan:  Continue current plan of care.  PMT will re-meet with family tomorrow but anticipate they will want to take him home with Hospice Support when he is medically optimized.  Will review the MOST form in the family meeting.   Code Status:  Limited code  Prognosis:   < 6 months   Discharge Planning:  To Be Determined.  Care plan was discussed with  RN, MD,  daughter.  Thank you for allowing the Palliative Medicine Team to assist in the care of this patient.  Total time spent:  35 min.     Greater than 50%  of this time was spent counseling and coordinating care related to the above assessment and plan.  Florentina Jenny, PA-C Palliative Medicine  Please contact Palliative MedicineTeam phone at [phone] for questions and concerns between 7 am - 7 pm.   Please see AMION for individual provider pager numbers.

## 2018-07-06 NOTE — Progress Notes (Signed)
Advanced Heart Failure Rounding Note  PCP-Cardiologist: [name], MD   Subjective:    Coox 85% on milrinone 0.25 mcg/kg/min. Milrinone stopped by CCM today  Was on lasix gtt at 15. Switched to torsemide 40 bid yesterday . Got dose of metolazone yesterday. Weight unchanged. CVP unchanged at 12-13. Still with mild SOB. Denies orthopnea or PND. Prednisone started this am for left ankle gout.    Cr 1.86 -> 1.52 -> 1.52 -> 1.49  Echo 07/04/18 LVEF 25-30%, Mild MR, Severe LAE, Severe RAE, Mild/Mod TR, Mod PI, PA peak pressure 59 mm Hg.   "5x5 cm cystic structure" noted. NOT seen on f/u CT.   Objective:   Weight Range: 90.5 kg Body mass index is 31.25 kg/m.   Vital Signs:   Temp:  [97.7 F (36.5 C)-98.8 F (37.1 C)] 97.7 F (36.5 C) (11/28 1135) Pulse Rate:  [70-112] 73 (11/28 1100) Resp:  [13-27] 21 (11/28 1100) BP: (80-110)/(44-71) 98/55 (11/28 1100) SpO2:  [85 %-100 %] 100 % (11/28 1100) FiO2 (%):  [30 %] 30 % (11/28 0804) Weight:  [90.5 kg] 90.5 kg (11/28 0500) Last BM Date: 07/05/18  Weight change: Filed Weights   07/04/18 1100 07/05/18 0449 07/06/18 0500  Weight: 90.9 kg 90.7 kg 90.5 kg    Intake/Output:   Intake/Output Summary (Last 24 hours) at 07/06/2018 1139 Last data filed at 07/06/2018 1100 Gross per 24 hour  Intake 791.23 ml  Output 3625 ml  Net -2833.77 ml    Physical Exam   General:  Lying in bed. Elderly. Chronicall ill appearing No resp difficulty HEENT: normal Neck: supple. RIJ TLC. JVP to ear Carotids 2+ bilat; no bruits. No lymphadenopathy or thryomegaly appreciated. Cor: PMI nondisplaced. Regular rate & rhythm. No rubs, gallops or murmurs. Lungs: clear but decreased BS throughout. No wheeze  Abdomen: soft, nontender, nondistended. No hepatosplenomegaly. No bruits or masses. Good bowel sounds. Extremities: no cyanosis, clubbing, rash, edema +SCDs Neuro: alert & orientedx3, cranial nerves grossly intact. moves all 4 extremities w/o  difficulty. Affect pleasant   Telemetry   BiV paced 70s, personally reviewed.   EKG    No new tracings.    Labs    CBC Recent Labs    07/05/18 0457 07/06/18 0515  WBC 1.7* 1.8*  NEUTROABS 0.9* 0.8*  HGB 7.5* 7.8*  HCT 25.2* 25.3*  MCV 118.9* 118.2*  PLT 48* 39*   Basic Metabolic Panel Recent Labs    07/05/18 0457 07/06/18 0515  NA 136 137  K 3.6 3.6  CL 93* 90*  CO2 37* 39*  GLUCOSE 103* 84  BUN 69* 63*  CREATININE 1.52* 1.49*  CALCIUM 8.3* 8.4*  MG 2.4 2.2  PHOS 4.1 3.3   Liver Function Tests No results for input(s): AST, ALT, ALKPHOS, BILITOT, PROT, ALBUMIN in the last 72 hours. No results for input(s): LIPASE, AMYLASE in the last 72 hours. Cardiac Enzymes No results for input(s): CKTOTAL, CKMB, CKMBINDEX, TROPONINI in the last 72 hours.  BNP: BNP (last 3 results) Recent Labs    01/12/18 0306 05/03/18 0406 06/27/18 1910  BNP 1,360.9* 1,865.0* 1,678.1*    ProBNP (last 3 results) No results for input(s): PROBNP in the last 8760 hours.   D-Dimer No results for input(s): DDIMER in the last 72 hours. Hemoglobin A1C No results for input(s): HGBA1C in the last 72 hours. Fasting Lipid Panel No results for input(s): CHOL, HDL, LDLCALC, TRIG, CHOLHDL, LDLDIRECT in the last 72 hours. Thyroid Function Tests No results for input(s):  TSH, T4TOTAL, T3FREE, THYROIDAB in the last 72 hours.  Invalid input(s): FREET3  Other results:   Imaging    Dg Chest Port 1 View  Result Date: 07/06/2018 CLINICAL DATA:  Respiratory failure EXAM: PORTABLE CHEST 1 VIEW COMPARISON:  CT from 07/04/2018 FINDINGS: Cardiac shadow remains enlarged. Pacing device and right jugular central line are seen and stable. The lungs are well aerated. The previously seen effusions and lower lobe atelectatic changes have improved when compare with the prior exam particularly on the right and to a lesser degree on the left. Continued follow-up is recommended. No bony abnormality is seen.  IMPRESSION: Improved aeration in the bases bilaterally. Continued follow-up is recommended. Electronically Signed   By: Inez Catalina M.D.   On: 07/06/2018 07:57     Medications:     Scheduled Medications: . chlorhexidine  15 mL Mouth Rinse BID  . docusate sodium  100 mg Oral Daily  . escitalopram  5 mg Oral Daily  . ipratropium-albuterol  3 mL Nebulization Q6H  . lenalidomide  5 mg Oral QHS  . mouth rinse  15 mL Mouth Rinse q12n4p  . pantoprazole  40 mg Oral Daily  . predniSONE  30 mg Oral Q breakfast  . rOPINIRole  4 mg Oral QHS  . sodium chloride flush  3 mL Intravenous Q12H  . spironolactone  12.5 mg Oral Daily  . torsemide  40 mg Oral BID    Infusions: . sodium chloride 10 mL/hr at 07/06/18 1100  . dextrose Stopped (07/03/18 1533)    PRN Medications: sodium chloride, acetaminophen, albuterol, alum & mag hydroxide-simeth, loratadine, sodium chloride, sodium chloride flush    Patient Profile   [name] Sr. is a 84 y.o. malewith a hx of PMH of CKD stage III, afib, HTN, HLD, MDS, NICM, symptomatic bradycardia s/p St Jude CRT-P, COPD on home O2, OSA, RLS and pulmonary HTN  Admitted with worsening weakness and DOE. Found to have PNA and acute on chronic systolic CHF.   Assessment/Plan   1. Acute on chronic systolic CHF: Echo 4/01 with EF 25-30%. St Jude CRT-P. Volume overloaded on admit started on milrinone 0.25.  UOP improved on lasix gtt - Echo 07/04/18 LVEF 25-30%, Mild MR, Severe LAE, Severe RAE, Mild/Mod TR, Mod PI, PA peak pressure 59 mm Hg.   "5x5 cm cystic structure" noted. NOT seen on f/u CT.  - Weight stable overnight on torsemide 40 bid (down 7 pounds total) CVP remains 12-13 cm. Will increase torsemide to 60 po bid - Coox 85% on milrinone 0.25 mcg/kg/min. Milrinone stopped by CCM.  Repeat co-ox 60%. Will follow. If co-ox continues to drop quickly off milrinone would suggest full Hospice care as he is not candidate for advanced therapies or home inotropes  with age and other comorbidities. - ICD interrogated in-house and confirmed is BiV pacing appropriately.  - He has had a significant fall in EF from the past, but with MDS/pancytopenia with platelets in the 40s and elevated creatinine, would hold off on coronary angiography.  2. Atrial fibrillation: Chronic - He is rate controlled underneath biV pacing - He has not been anticoagulated due to AVM bleeding.  No change.  3. ID: - Covering with cefepime for PNA => completed course. Afebrile.  4. AKI on CKD stage 3: - Creatinine 1.8 -> 1.52 -> 1.52 -> 1.49. Follow. 5. Myelodysplastic syndrome: - Pancytopenia. Stable. No bleeding 6. Thrombocytopenia - In setting of #5. Hold Sub Q heparin for now. On SCDs 7. Anemia -  Has pancytopenia. Per primary  8. Acute gout - agree with prednisone. Consider 3 day burst of 40 daily 8. Disposition - Palliative care following. Now limited DNR but family still struggling with decision. See discussion above regarding Hospice.   Length of Stay: [place], MD  07/06/2018, 11:39 AM  Advanced Heart Failure Team Pager [phone] (M-F; 7a - 4p)  Please contact [facility] for night-coverage after hours (4p -7a ) and weekends on [email or web address]

## 2018-07-06 NOTE — Progress Notes (Signed)
NAME:  Anthony Parisi., MRN:  [id number], DOB:  31-Jan-1932, LOS: 9 ADMISSION DATE:  06/27/2018, CONSULTATION DATE:  11/23 REFERRING MD:  Dr. [name], CHIEF COMPLAINT:  SOB   Brief History    84 year old male with past medical history as below, which is significant for COPD (on 3 L supplemental oxygen at home), systolic congestive heart failure with recent ejection fraction 25 to 30%, CKD stage III and myelodysplasia.  He was admitted to [facility] on 11/19 with complaints of several days shortness of breath and progressive weakness.  Imaging upon admission was concerning for pneumonia and pleural effusion.  He was admitted for treatment of presumed pneumonia and treated with empiric antibiotics.  Inpatient course became complicated by worsening dyspnea which improved with IV diuretics.  His blood pressure remained borderline so he was difficult to diurese.  On 11/23 cardiology was consulted for further evaluation who felt he had low cardiac output despite hypervolemia.  IV milrinone was started in hopes that he would be able to be diuresed.  He was transferred to ICU for continued shortness of breath requiring BiPAP and the initiation of milrinone infusion.  PCCM was asked to evaluate.   Significant Hospital Events   11/19 admit 11/23 ICU transfer 11/25 off BIPAP but required pressors overnight 11/27 off pressors looking better. Cont lasix   Consults:  Cardiology 11/23  Procedures:    Significant Diagnostic Tests:  Echo 11/25 > EF 25%, left ventricle severely dilated systolic function was severely reduced.  There is akiiness changes of the lateral inferior myocardium.  Questionable of possible pericardial cyst.  Micro Data:  Blood 11/19 > neg.  Antimicrobials:  Cefepime 11/19 > (stop date planned 11/27) Vanco 11/19 > 11/20  Interim history/subjective:  Breathing has improved.  Denies chest pain.  Really only complaint today is in regards to left ankle pain he refers to  as gout pain which she has had in the past  Objective   Blood pressure (Abnormal) 95/55, pulse 76, temperature 98 F (36.7 C), temperature source Axillary, resp. rate 19, height 5\' 7"  (1.702 m), weight 90.5 kg, SpO2 99 %. CVP:  [15 mmHg] 15 mmHg  FiO2 (%):  [30 %] 30 %   Intake/Output Summary (Last 24 hours) at 07/06/2018 0852 Last data filed at 07/06/2018 0800 Gross per 24 hour  Intake 907.99 ml  Output 4125 ml  Net -3217.01 ml   Filed Weights   07/04/18 1100 07/05/18 0449 07/06/18 0500  Weight: 90.9 kg 90.7 kg 90.5 kg   PHYSICAL EXAM: General: Chronically ill-appearing 84 year old male currently in no distress HEENT normocephalic atraumatic no jugular venous distention today mucous membranes moist Pulmonary: Diminished bases no accessory use Cardiac: Regular irregular atrial fibrillation Abdomen: Soft nontender Extremities: Warm dry no significant edema, has pain to palpation left ankle GU: Voids Neuro: Awake oriented no focal deficits.  Resolved issues  HCAP (completed abx 11/27) Sepsis  Shock (mix of cardiogenic and sepsis) Assessment & Plan:   Acute on chronic hypercarbic respiratory failure: in the setting of PNA, CHF, and decompensated OSA (hx noncompliance with CPAP). Plan Continue nocturnal CPAP Continue supplemental oxygen Pulse oximetry checks  COPD without acute exacerbation Plan Continue bronchodilators  Acute on chronic HFrEF (25-30% EF) Plan Discontinue milrinone Continue current diuretic regimen Repeat cooximetry at 1 PM today  Atrial fibrillation no longer on anticoagulation due to bleeding risk.  Plan Continue rate control medications Not a candidate for anticoagulation given history of bleeding  AKI on CKD III -  improving with diuresis. Plan Cont diuresis at current dosing Am chemistry  Pancytopenia w/ h/o Myelodysplasia low grade. Followed by Dr. Marin Olp. Plan Cont pre-admit meds Trend cbc  Macrocytic Anemia c/b ? Melena overnight  11/25 - FOBT still pending H&H stable Plan Trend cbc If pos FOB may need GI consult   Best practice:  Diet: Heart healthy. Pain/Anxiety/Delirium protocol (if indicated): n/a VAP protocol (if indicated): n/a DVT prophylaxis: sq heparin GI prophylaxis: protonix Glucose control: none. Mobility: Bedrest Code Status: FULL Family Communication: 07/05/2018 and updated bedside no family at bedside. Disposition: Now off milrinone.  If call oximetry remains stable we can move him out of the intensive care later today   Erick Colace ACNP-BC [place] Pager # [phone] OR # [phone] if no answer

## 2018-07-07 DIAGNOSIS — Z515 Encounter for palliative care: Secondary | ICD-10-CM

## 2018-07-07 LAB — BASIC METABOLIC PANEL
Anion gap: 7 (ref 5–15)
BUN: 64 mg/dL — AB (ref 8–23)
CO2: 43 mmol/L — ABNORMAL HIGH (ref 22–32)
CREATININE: 1.43 mg/dL — AB (ref 0.61–1.24)
Calcium: 8.6 mg/dL — ABNORMAL LOW (ref 8.9–10.3)
Chloride: 88 mmol/L — ABNORMAL LOW (ref 98–111)
GFR calc Af Amer: 52 mL/min — ABNORMAL LOW (ref >60)
GFR calc non Af Amer: 45 mL/min — ABNORMAL LOW (ref >60)
GLUCOSE: 112 mg/dL — AB (ref 70–99)
POTASSIUM: 3.4 mmol/L — AB (ref 3.5–5.1)
Sodium: 138 mmol/L (ref 135–145)

## 2018-07-07 LAB — CBC WITH DIFFERENTIAL/PLATELET
ABS IMMATURE GRANULOCYTES: 0.01 10*3/uL (ref 0.00–0.07)
Basophils Absolute: 0 10*3/uL (ref 0.0–0.1)
Basophils Relative: 1 %
EOS PCT: 0 %
Eosinophils Absolute: 0 10*3/uL (ref 0.0–0.5)
HCT: 26.6 % — ABNORMAL LOW (ref 39.0–52.0)
Hemoglobin: 7.9 g/dL — ABNORMAL LOW (ref 13.0–17.0)
Immature Granulocytes: 1 %
Lymphocytes Relative: 25 %
Lymphs Abs: 0.5 10*3/uL — ABNORMAL LOW (ref 0.7–4.0)
MCH: 35.3 pg — AB (ref 26.0–34.0)
MCHC: 29.7 g/dL — AB (ref 30.0–36.0)
MCV: 118.8 fL — AB (ref 80.0–100.0)
MONO ABS: 0.3 10*3/uL (ref 0.1–1.0)
MONOS PCT: 14 %
NEUTROS ABS: 1.2 10*3/uL — AB (ref 1.7–7.7)
Neutrophils Relative %: 59 %
PLATELETS: 38 10*3/uL — AB (ref 150–400)
RBC: 2.24 MIL/uL — AB (ref 4.22–5.81)
RDW: 18.3 % — ABNORMAL HIGH (ref 11.5–15.5)
WBC: 2 10*3/uL — AB (ref 4.0–10.5)
nRBC: 0 % (ref 0.0–0.2)

## 2018-07-07 LAB — COOXEMETRY PANEL
Carboxyhemoglobin: 2.1 % — ABNORMAL HIGH (ref 0.5–1.5)
Methemoglobin: 1.7 % — ABNORMAL HIGH (ref 0.0–1.5)
O2 Saturation: 63.3 %
Total hemoglobin: 8.7 g/dL — ABNORMAL LOW (ref 12.0–16.0)

## 2018-07-07 LAB — PATHOLOGIST SMEAR REVIEW

## 2018-07-07 MED ORDER — POTASSIUM CHLORIDE CRYS ER 10 MEQ PO TBCR
20.0000 meq | EXTENDED_RELEASE_TABLET | Freq: Two times a day (BID) | ORAL | Status: DC
  Administered 2018-07-07 – 2018-07-09 (×5): 20 meq via ORAL
  Filled 2018-07-07 (×8): qty 2

## 2018-07-07 MED ORDER — PREDNISONE 20 MG PO TABS
30.0000 mg | ORAL_TABLET | Freq: Every day | ORAL | Status: AC
  Administered 2018-07-07 – 2018-07-09 (×3): 30 mg via ORAL
  Filled 2018-07-07 (×3): qty 1

## 2018-07-07 MED ORDER — DIGOXIN 125 MCG PO TABS
0.1250 mg | ORAL_TABLET | Freq: Every day | ORAL | Status: DC
  Administered 2018-07-07 – 2018-07-09 (×3): 0.125 mg via ORAL
  Filled 2018-07-07 (×3): qty 1

## 2018-07-07 MED ORDER — POLYVINYL ALCOHOL 1.4 % OP SOLN
1.0000 [drp] | OPHTHALMIC | Status: DC | PRN
  Administered 2018-07-07 – 2018-07-09 (×2): 1 [drp] via OPHTHALMIC
  Filled 2018-07-07: qty 15

## 2018-07-07 MED ORDER — IPRATROPIUM-ALBUTEROL 0.5-2.5 (3) MG/3ML IN SOLN: 3.0000 mL | Freq: Four times a day (QID) | RESPIRATORY_TRACT | Status: DC | PRN

## 2018-07-07 MED ORDER — POTASSIUM CHLORIDE CRYS ER 20 MEQ PO TBCR
40.0000 meq | EXTENDED_RELEASE_TABLET | Freq: Once | ORAL | Status: AC
  Administered 2018-07-07: 40 meq via ORAL
  Filled 2018-07-07: qty 2

## 2018-07-07 NOTE — Progress Notes (Signed)
PCCM Interval Progress Note  Repeat co-ox stable at 63 off of milrinone (60 on 11/28) .  Will transfer pt to SDU.  Palliative care to meet with family today regarding goals of care.  Transfer to [facility] in AM 11/30 with PCCM off at that time.  Discussed with Dr. Louanne Belton.   Montey Hora, [place] [facility] Pager: [phone]  or [phone] 07/07/2018, 11:32 AM

## 2018-07-07 NOTE — Care Management Important Message (Signed)
Important Message  Patient Details  Name: Anthony CARITHERS Sr. MRN: [id number] Date of Birth: 1932-06-04   Medicare Important Message Given:  Yes    [name] 07/07/2018, 12:59 PM

## 2018-07-07 NOTE — Progress Notes (Addendum)
Advanced Heart Failure Rounding Note  PCP-Cardiologist: [name], MD   Subjective:    Milrinone stopped yesterday. Torsemide increased to 60 bid. Weight down 7 pounds overnight. Co-ox stable at 63%  Feels ok. Denies SOB, orthopnea or PND. SBP 90s. Left ankle pain improved with prednisone . CVP 7-8   Cr 1.86 -> 1.52 -> 1.52 -> 1.49 -> 1.43  Echo 07/04/18 LVEF 25-30%, Mild MR, Severe LAE, Severe RAE, Mild/Mod TR, Mod PI, PA peak pressure 59 mm Hg.   "5x5 cm cystic structure" noted. NOT seen on f/u CT.   Objective:   Weight Range: 87.4 kg Body mass index is 30.18 kg/m.   Vital Signs:   Temp:  [97.9 F (36.6 C)-98.7 F (37.1 C)] 98.3 F (36.8 C) (11/29 1152) Pulse Rate:  [51-81] 81 (11/29 1230) Resp:  [13-26] 25 (11/29 1230) BP: (83-107)/(49-68) 92/62 (11/29 1230) SpO2:  [87 %-100 %] 94 % (11/29 1230) FiO2 (%):  [30 %] 30 % (11/29 0110) Weight:  [87.4 kg] 87.4 kg (11/29 0500) Last BM Date: 07/05/18  Weight change: Filed Weights   07/05/18 0449 07/06/18 0500 07/07/18 0500  Weight: 90.7 kg 90.5 kg 87.4 kg    Intake/Output:   Intake/Output Summary (Last 24 hours) at 07/07/2018 1403 Last data filed at 07/07/2018 0500 Gross per 24 hour  Intake 351 ml  Output 2925 ml  Net -2574 ml    Physical Exam   General:  Elderly. Lying in bed  No resp difficulty HEENT: normal Neck: supple. RIJ TLC. JVP 6-7 . Carotids 2+ bilat; no bruits. No lymphadenopathy or thryomegaly appreciated. Cor: PMI nondisplaced. Regular rate & rhythm. 2/6 TR Lungs: clear Abdomen: soft, nontender, nondistended. No hepatosplenomegaly. No bruits or masses. Good bowel sounds. Extremities: no cyanosis, clubbing, rash, edema + foley  Neuro: alert & orientedx3, cranial nerves grossly intact. moves all 4 extremities w/o difficulty. Affect pleasant   Telemetry   BiV paced 70s, personally reviewed.   EKG    No new tracings.    Labs    CBC Recent Labs    07/06/18 0515 07/07/18 0515    WBC 1.8* 2.0*  NEUTROABS 0.8* 1.2*  HGB 7.8* 7.9*  HCT 25.3* 26.6*  MCV 118.2* 118.8*  PLT 39* 38*   Basic Metabolic Panel Recent Labs    07/05/18 0457 07/06/18 0515 07/07/18 0515  NA 136 137 138  K 3.6 3.6 3.4*  CL 93* 90* 88*  CO2 37* 39* 43*  GLUCOSE 103* 84 112*  BUN 69* 63* 64*  CREATININE 1.52* 1.49* 1.43*  CALCIUM 8.3* 8.4* 8.6*  MG 2.4 2.2  --   PHOS 4.1 3.3  --    Liver Function Tests No results for input(s): AST, ALT, ALKPHOS, BILITOT, PROT, ALBUMIN in the last 72 hours. No results for input(s): LIPASE, AMYLASE in the last 72 hours. Cardiac Enzymes No results for input(s): CKTOTAL, CKMB, CKMBINDEX, TROPONINI in the last 72 hours.  BNP: BNP (last 3 results) Recent Labs    01/12/18 0306 05/03/18 0406 06/27/18 1910  BNP 1,360.9* 1,865.0* 1,678.1*    ProBNP (last 3 results) No results for input(s): PROBNP in the last 8760 hours.   D-Dimer No results for input(s): DDIMER in the last 72 hours. Hemoglobin A1C No results for input(s): HGBA1C in the last 72 hours. Fasting Lipid Panel No results for input(s): CHOL, HDL, LDLCALC, TRIG, CHOLHDL, LDLDIRECT in the last 72 hours. Thyroid Function Tests No results for input(s): TSH, T4TOTAL, T3FREE, THYROIDAB in the last 72  hours.  Invalid input(s): FREET3  Other results:   Imaging    No results found.   Medications:     Scheduled Medications: . chlorhexidine  15 mL Mouth Rinse BID  . docusate sodium  100 mg Oral Daily  . escitalopram  5 mg Oral Daily  . lenalidomide  5 mg Oral QHS  . mouth rinse  15 mL Mouth Rinse q12n4p  . pantoprazole  40 mg Oral Daily  . predniSONE  30 mg Oral Q breakfast  . rOPINIRole  4 mg Oral QHS  . sodium chloride flush  3 mL Intravenous Q12H  . spironolactone  12.5 mg Oral Daily  . torsemide  60 mg Oral BID    Infusions: . sodium chloride 250 mL (07/07/18 0406)  . dextrose Stopped (07/03/18 1533)    PRN Medications: sodium chloride, acetaminophen, albuterol,  alum & mag hydroxide-simeth, ipratropium-albuterol, loratadine, sodium chloride, sodium chloride flush    Patient Profile   Anthony SCHEXNAYDER Sr. is a 84 y.o. malewith a hx of PMH of CKD stage III, afib, HTN, HLD, MDS, NICM, symptomatic bradycardia s/p St Jude CRT-P, COPD on home O2, OSA, RLS and pulmonary HTN  Admitted with worsening weakness and DOE. Found to have PNA and acute on chronic systolic CHF.   Assessment/Plan   1. Acute on chronic systolic CHF: Echo 9/48 with EF 25-30%. St Jude CRT-P. Volume overloaded on admit started on milrinone 0.25.  UOP improved on lasix gtt - Echo 07/04/18 LVEF 25-30%, Mild MR, Severe LAE, Severe RAE, Mild/Mod TR, Mod PI, PA peak pressure 59 mm Hg.   "5x5 cm cystic structure" noted. NOT seen on f/u CT.  - Now off milrinone. Co-ox stable at 63%. Will continue to follow - Volume status improved with CVP 7. On torsemide 60 bid. Will continue today. May need to cut back tomorrow. Renal function stable.  - Follow co-ox over next day or two. If drops off milrinone would suggest full Hospice care as he is not candidate for advanced therapies or home inotropes with age and other comorbidities. If stabilizes will titrate HF meds as tolerated. Appreciate Palliative Care teams efforts.  - Continue spiro. Start digoxin 0.125. - No b-blocker or ACE/ARB/ARNI with decompensation and low BP.  - ICD interrogated in-house and confirmed is BiV pacing appropriately.  - He has had a significant fall in EF from the past, but with MDS/pancytopenia with platelets in the 40s and elevated creatinine, would hold off on coronary angiography.  2. Atrial fibrillation: Chronic - He is rate controlled underneath biV pacing - He has not been anticoagulated due to AVM bleeding.  No change. Continue SCDs 3. ID: - Covering with cefepime for PNA => completed course. Afebrile.  4. AKI on CKD stage 3: - Creatinine 1.8 -> 1.52 -> 1.52 -> 1.49 -. 1.43 5. Myelodysplastic syndrome: -  Pancytopenia. Stable. No bleeding 6. Thrombocytopenia - In setting of #5. Hold Sub Q heparin for now. On SCDs 7. Anemia - Has pancytopenia. Per primary  8. Acute gout - agree with prednisone for 3 day burst. Today is day 2/3 9. Hypokalemia - K 3.4. Will supp 10. Disposition  - Palliative care following. I support Hospice care.    Length of Stay: [place], MD  07/07/2018, 2:03 PM  Advanced Heart Failure Team Pager [phone] (M-F; 7a - 4p)  Please contact [facility] for night-coverage after hours (4p -7a ) and weekends on [email or web address]

## 2018-07-07 NOTE — Plan of Care (Signed)
  Problem: Health Behavior/Discharge Planning: Goal: Ability to manage health-related needs will improve Outcome: Progressing   Problem: Clinical Measurements: Goal: Respiratory complications will improve Outcome: Progressing Goal: Cardiovascular complication will be avoided Outcome: Progressing   Problem: Activity: Goal: Capacity to carry out activities will improve Outcome: Progressing   Problem: Nutrition: Goal: Adequate nutrition will be maintained Outcome: Completed/Met

## 2018-07-07 NOTE — Progress Notes (Signed)
NAME:  [name]., MRN:  [id number], DOB:  Dec 10, 1931, LOS: 81 ADMISSION DATE:  06/27/2018, CONSULTATION DATE:  11/23 REFERRING MD:  Dr. [name], CHIEF COMPLAINT:  SOB   Brief History    84 year old male with past medical history as below, which is significant for COPD (on 3 L supplemental oxygen at home), systolic congestive heart failure with recent ejection fraction 25 to 30%, CKD stage III and myelodysplasia.  He was admitted to [facility] on 11/19 with complaints of several days shortness of breath and progressive weakness.  Imaging upon admission was concerning for pneumonia and pleural effusion.  He was admitted for treatment of presumed pneumonia and treated with empiric antibiotics.  Inpatient course became complicated by worsening dyspnea which improved with IV diuretics.  His blood pressure remained borderline so he was difficult to diurese.  On 11/23 cardiology was consulted for further evaluation who felt he had low cardiac output despite hypervolemia.  IV milrinone was started in hopes that he would be able to be diuresed.  He was transferred to ICU for continued shortness of breath requiring BiPAP and the initiation of milrinone infusion.  PCCM was asked to evaluate.   Significant Hospital Events   11/19 admit 11/23 ICU transfer 11/25 off BIPAP but required pressors overnight 11/27 off pressors looking better. Cont lasix  11/28 > palliative care consulted, plans to meet with family 11/29  Consults:  Cardiology 11/23 Palliative care 11/28  Procedures:    Significant Diagnostic Tests:  Echo 11/25 > EF 25%, left ventricle severely dilated systolic function was severely reduced.  There is akiiness changes of the lateral inferior myocardium.  Questionable of possible pericardial cyst.  Micro Data:  Blood 11/19 > neg.  Antimicrobials:  Cefepime 11/19 > (stop date planned 11/27) Vanco 11/19 > 11/20  Interim history/subjective:  No acute events.  "feels  good" this AM.  Objective   Blood pressure 101/61, pulse 70, temperature 98.1 F (36.7 C), temperature source Oral, resp. rate 16, height 5\' 7"  (1.702 m), weight 87.4 kg, SpO2 100 %.    FiO2 (%):  [30 %] 30 %   Intake/Output Summary (Last 24 hours) at 07/07/2018 0838 Last data filed at 07/07/2018 0500 Gross per 24 hour  Intake 410.86 ml  Output 4075 ml  Net -3664.14 ml   Filed Weights   07/05/18 0449 07/06/18 0500 07/07/18 0500  Weight: 90.7 kg 90.5 kg 87.4 kg   PHYSICAL EXAM: General: Chronically ill-appearing 84 year old male, resting comfortably HEENT normocephalic atraumatic no jugular venous distention today mucous membranes moist Pulmonary: Diminished in bilateral bases, otherwise clear Cardiac: IRIR Abdomen: Soft nontender Extremities: Warm dry no significant edema, has pain to palpation left ankle (gout) Neuro: Awake oriented no focal deficits  Assessment & Plan:   Acute on chronic hypercarbic respiratory failure: in the setting of PNA, CHF, and decompensated OSA (hx noncompliance with CPAP). Plan Continue nocturnal CPAP Continue supplemental oxygen Pulse oximetry checks  COPD without acute exacerbation. Plan Continue bronchodilators  Acute on chronic HFrEF (25-30% EF). Plan Discontinue milrinone Continue current diuretic regimen (now on torsemide 60 PO BID) Repeat cooximetry, if has dropped further then heart failure team recommending home with hospice as pt is not a candidate for advanced therapies  Atrial fibrillation no longer on anticoagulation due to bleeding risk.  Plan Continue rate control medications (also is rate controlled on biV pacing) Not a candidate for anticoagulation given history of bleeding  AKI on CKD III - improving with diuresis. Plan Cont  diuresis at current dosing Follow BMP  Pancytopenia w/ h/o Myelodysplasia low grade. Followed by Dr. Marin Olp. Plan Cont pre-admit meds Trend CBC  Macrocytic Anemia c/b ? Melena overnight  11/25 - FOBT still pending H&H stable Plan Trend cbc If pos FOB may need GI consult   Hypokalemia Plan 40 mEq K PO  Follow BMP  Gout flare - started on prednisone 11/28 Plan Continue prednisone 40mg  PO x 3 days  Repeat Coox pending.  Depending on result, might be OK to transfer out of ICU.  If so, will place order to transfer to SDU for now with [place] pickup in AM 11/30 and PCCM off at that time.  Will f/u on results.   Best practice:  Diet: Heart healthy. Pain/Anxiety/Delirium protocol (if indicated): n/a VAP protocol (if indicated): n/a DVT prophylaxis: SCDs GI prophylaxis: protonix Glucose control: none Mobility: Bedrest Code Status: Limited - vasopressors / inotropes and BiPAP OK.  No CPR / defib. Family Communication: 07/05/2018 and updated bedside no family at bedside.  Palliative care meeting planned for today 11/29 (pt's daughter [name] is in favor of home with hospice regardless of the outcome of this hospitalization). Disposition: Off milrinone.  Pending repeat co-ox this AM, might be OK to transfer out of ICU.  If so, will place order to transfer to SDU for now with [place] pickup in AM 11/30 and PCCM off at that time.  Will f/u on results.    Montey Hora, [place] [facility] [place] [facility] Pager: [phone]  or [phone] 07/07/2018, 8:52 AM

## 2018-07-07 NOTE — Progress Notes (Signed)
Spoke with dtr [name] on the phone.  Discussed Co-ox and Heart Failure Team's recommendations to continue to follow the labs.  Dtr is grateful for the excellent care her father has received in ICU and from HF team.    [name] expressed concern that Revlimid may have contributed to her father's heart failure exacerbation.  I explained to her that that was possible but unlikely.  Dr. Marin Olp has her father on a very low dose - making it even less likely.    [name] stated she never wanted her father on Revlimid therapy.  She wants him to enjoy his time at home with wife/family and hospice support.  We agreed that in the next day or two the family should meet again with PMT and talk about next steps.  PMT will continue to follow.  Florentina Jenny, PA-C Palliative Medicine Pager: [phone]  No charge note.

## 2018-07-07 NOTE — Consult Note (Signed)
   [facility] CM Inpatient Consult   07/07/2018  NUR KRASINSKI Sr. 02-Nov-1931 [id number]  Follow up:  Patient transferred from ICU now in [place] on 6E.  Chart reviewed for high risk score [29%] for unplanned readmissions and with frequent admissions in the past 6 months.  Chart review reveals patient is being referred to Palliative Care and Hospice. No [facility] Care Management community needs noted. For questions or referral please contact:  Natividad Brood, RN BSN [facility] Liaison  [phone] business mobile phone Toll free office [phone]

## 2018-07-08 DIAGNOSIS — Z66 Do not resuscitate: Secondary | ICD-10-CM

## 2018-07-08 LAB — CBC WITH DIFFERENTIAL/PLATELET
Abs Immature Granulocytes: 0 10*3/uL (ref 0.00–0.07)
Basophils Absolute: 0 10*3/uL (ref 0.0–0.1)
Basophils Relative: 1 %
Eosinophils Absolute: 0 10*3/uL (ref 0.0–0.5)
Eosinophils Relative: 1 %
HCT: 29.3 % — ABNORMAL LOW (ref 39.0–52.0)
Hemoglobin: 9.1 g/dL — ABNORMAL LOW (ref 13.0–17.0)
Immature Granulocytes: 0 %
Lymphocytes Relative: 27 %
Lymphs Abs: 0.6 10*3/uL — ABNORMAL LOW (ref 0.7–4.0)
MCH: 35.7 pg — ABNORMAL HIGH (ref 26.0–34.0)
MCHC: 31.1 g/dL (ref 30.0–36.0)
MCV: 114.9 fL — ABNORMAL HIGH (ref 80.0–100.0)
Monocytes Absolute: 0.3 10*3/uL (ref 0.1–1.0)
Monocytes Relative: 14 %
Neutro Abs: 1.2 10*3/uL — ABNORMAL LOW (ref 1.7–7.7)
Neutrophils Relative %: 57 %
Platelets: 37 10*3/uL — ABNORMAL LOW (ref 150–400)
RBC: 2.55 MIL/uL — ABNORMAL LOW (ref 4.22–5.81)
RDW: 17.8 % — ABNORMAL HIGH (ref 11.5–15.5)
WBC: 2.1 10*3/uL — ABNORMAL LOW (ref 4.0–10.5)
nRBC: 0 % (ref 0.0–0.2)

## 2018-07-08 LAB — BASIC METABOLIC PANEL
Anion gap: 10 (ref 5–15)
BUN: 68 mg/dL — AB (ref 8–23)
CO2: 43 mmol/L — ABNORMAL HIGH (ref 22–32)
Calcium: 9 mg/dL (ref 8.9–10.3)
Chloride: 85 mmol/L — ABNORMAL LOW (ref 98–111)
Creatinine, Ser: 1.38 mg/dL — ABNORMAL HIGH (ref 0.61–1.24)
GFR calc Af Amer: 54 mL/min — ABNORMAL LOW (ref >60)
GFR calc non Af Amer: 47 mL/min — ABNORMAL LOW (ref >60)
Glucose, Bld: 97 mg/dL (ref 70–99)
Potassium: 3.9 mmol/L (ref 3.5–5.1)
Sodium: 138 mmol/L (ref 135–145)

## 2018-07-08 LAB — COOXEMETRY PANEL
Carboxyhemoglobin: 2.3 % — ABNORMAL HIGH (ref 0.5–1.5)
Methemoglobin: 1.2 % (ref 0.0–1.5)
O2 Saturation: 68.5 %
TOTAL HEMOGLOBIN: 9.4 g/dL — AB (ref 12.0–16.0)

## 2018-07-08 MED ORDER — TORSEMIDE 20 MG PO TABS
60.0000 mg | ORAL_TABLET | Freq: Every day | ORAL | Status: DC
  Administered 2018-07-09: 60 mg via ORAL
  Filled 2018-07-08: qty 3

## 2018-07-08 NOTE — Progress Notes (Signed)
Daily Progress Note   Patient Name: Anthony BLAKELY Sr.       Date: 07/08/2018 DOB: 03-02-1932  Age: 84 y.o. MRN#: [id number] Attending Physician: Patrecia Pour, MD Primary Care Physician: [name], MD Admit Date: 06/27/2018  Reason for Consultation/Follow-up: Establishing goals of care  Subjective: Patient is comfortable appearing. Says he wants to go home soon.   Length of Stay: 11  Current Medications: Scheduled Meds:  . chlorhexidine  15 mL Mouth Rinse BID  . digoxin  0.125 mg Oral Daily  . docusate sodium  100 mg Oral Daily  . escitalopram  5 mg Oral Daily  . lenalidomide  5 mg Oral QHS  . mouth rinse  15 mL Mouth Rinse q12n4p  . pantoprazole  40 mg Oral Daily  . potassium chloride  20 mEq Oral BID  . predniSONE  30 mg Oral Q breakfast  . rOPINIRole  4 mg Oral QHS  . sodium chloride flush  3 mL Intravenous Q12H  . spironolactone  12.5 mg Oral Daily  . [START ON 07/09/2018] torsemide  60 mg Oral Daily    Continuous Infusions: . sodium chloride 10 mL/hr at 07/07/18 0519  . dextrose Stopped (07/03/18 1533)    PRN Meds: sodium chloride, acetaminophen, albuterol, alum & mag hydroxide-simeth, ipratropium-albuterol, loratadine, polyvinyl alcohol, sodium chloride, sodium chloride flush  Physical Exam          Vital Signs: BP (!) 99/54 (BP Location: Right Arm)   Pulse 70   Temp (!) 97.3 F (36.3 C) (Axillary)   Resp 15   Ht 5\' 7"  (1.702 m)   Wt 81.2 kg   SpO2 98%   BMI 28.04 kg/m  SpO2: SpO2: 98 % O2 Device: O2 Device: Nasal Cannula O2 Flow Rate: O2 Flow Rate (L/min): 3 L/min  Intake/output summary:   Intake/Output Summary (Last 24 hours) at 07/08/2018 1505 Last data filed at 07/08/2018 1234 Gross per 24 hour  Intake 1222.28 ml  Output 2751 ml  Net  -1528.72 ml   LBM: Last BM Date: 07/07/18 Baseline Weight: Weight: 86.2 kg Most recent weight: Weight: 81.2 kg       Palliative Assessment/Data:      Patient Active Problem List   Diagnosis Date Noted  . Palliative care encounter   . DNR (do not resuscitate)   .  PNA (pneumonia) 06/27/2018  . Lethargy 05/16/2018  . MDS (myelodysplastic syndrome), high grade ([place]) 05/11/2018  . Advanced care planning/counseling discussion   . Goals of care, counseling/discussion   . Palliative care by specialist   . AKI (acute kidney injury) ([place])   . Acute on chronic combined systolic and diastolic CHF (congestive heart failure) ([place]) 05/03/2018  . Petechial rash 05/03/2018  . Biventricular cardiac pacemaker  st Judes  dual chamber with LV in atrial port  03/29/2018  . Congestive heart failure, NYHA class 3, chronic, systolic ([place]) 71/69/6789  . GI bleed 11/03/2017  . Acute renal failure superimposed on stage 3 chronic kidney disease ([place]) 11/03/2017  . Chest pain 11/03/2017  . Pancytopenia ([place]) 09/26/2017  . CKD (chronic kidney disease), stage III ([place]) 09/26/2017  . Chronic pain 09/26/2017  . Acute on chronic respiratory failure with hypoxia ([place]) 09/26/2017  . Right shoulder injury, initial encounter 04/08/2017  . Chronic atrial fibrillation 11/23/2016  . Secondary hypercoagulable state ([place])   . Transaminitis   . PAH (pulmonary artery hypertension) ([place])   . Chronic systolic heart failure ([place]) 09/09/2015  . Poor short term memory 11/05/2013  . Fatigue 10/13/2011  . Dyspnea on exertion 10/13/2011  . Restless leg syndrome 10/13/2011  . Chronic anticoagulation 10/13/2011  . Nonischemic cardiomyopathy - coronaries by angiography 2010 10/13/2011  . OSA on CPAP 10/13/2011  . Tremor, hereditary, benign 10/13/2011  . Depression 10/13/2011  . Other secondary pulmonary hypertension ([place]) 10/13/2011  . MRSA (methicillin resistant Staphylococcus aureus) carrier 10/13/2011  . BPH (benign  prostatic hyperplasia) 10/13/2011    Palliative Care Assessment & Plan   Patient Profile: 84 y.o.malewith past medical history of CHF with multiple hospitalizations, s/p CRT-P placement, MDS (on Revlimid), CKD, HTNadmitted on 11/19/2019with acute on chronic CHF exacerbation, cardiogenic/septic shock, PNA.During admission he required bipap and pressors, but now is off pressors and on nasal cannula oxygen. He wears a CPAP at night. He is on milrinone and continuous IV lasix infusion for diuresis. Creatinine was up but is trending down. Palliative medicine consulted for [place].  Assessment: I spoke by phone with patient's daughter. Daughter has talked today with patient by phone. She requested that Dr. Marin Olp be consulted this hospitalization if possible. She would like for him to speak with her father about stopping the Revlimid. However, she understands that patient might need to f/u with Dr. Marin Olp outpatient to have that conversation. She also recognizes that patient cannot have hospice care on Revlimid and she would prioritize hospice involvement at this point.   Daughter says that she has talked with her father about hospice involvement and that he agrees it would be beneficial. Will consult CM.  Recommendations/Plan:  Continue supportive care  CM consult for hospice at home  Thank you for allowing the Palliative Medicine Team to assist in the care of this patient.   Time In: 1400 Time Out: 1430 Total Time 30 minutes Prolonged Time Billed  No      Greater than 50%  of this time was spent counseling and coordinating care related to the above assessment and plan.  Irean Hong, NP  Please contact Palliative Medicine Team phone at [phone] for questions and concerns.

## 2018-07-08 NOTE — Plan of Care (Signed)
  Problem: Health Behavior/Discharge Planning: Goal: Ability to manage health-related needs will improve Outcome: Progressing   Problem: Clinical Measurements: Goal: Ability to maintain clinical measurements within normal limits will improve Outcome: Progressing Goal: Respiratory complications will improve Outcome: Progressing Goal: Cardiovascular complication will be avoided Outcome: Progressing   

## 2018-07-08 NOTE — Progress Notes (Signed)
Patient has not voided since foley catheter removed at 1335hrs.  States he feels like he could, but unable to go.  States he is not uncomfortable. Bladder scan done, 312cc urine noted.  Will continue to monitor q1h for u/o.

## 2018-07-08 NOTE — Progress Notes (Addendum)
PROGRESS NOTE  [name]  HCW:[id number] DOB: [birth year] DOA: 06/27/2018 PCP: [name], MD   Brief Narrative: Anthony Agar Sr. is an 84 year old male with past medical history as below, which is significant for COPD (on 3 L supplemental oxygen at home), systolic congestive heart failure with recent ejection fraction 25 to 30%, CKD stage III and myelodysplasia.  He was admitted to [facility] on 11/19 with complaints of several days shortness of breath and progressive weakness.  Imaging upon admission was concerning for pneumonia and pleural effusion.  He was admitted for treatment of presumed pneumonia and treated with empiric antibiotics.  Inpatient course became complicated by worsening dyspnea which improved with IV diuretics.  His blood pressure remained borderline so he was difficult to diurese.  On 11/23 cardiology was consulted for further evaluation who felt he had low cardiac output despite hypervolemia.  IV milrinone was started in hopes that he would be able to be diuresed.  He was transferred to ICU for continued shortness of breath requiring BiPAP and the initiation of milrinone infusion. He required pressors off and on 11/25 - 11/27. Palliative care was consulted and facilitated discussions regarding home hospice beginning 11/29. He was transferred after stabilization to the stepdown unit 11/29 onto the hospitalist team 11/30.  Assessment & Plan: Principal Problem:   PNA (pneumonia) Active Problems:   Nonischemic cardiomyopathy - coronaries by angiography 1761   Chronic systolic heart failure (HCC)   Chronic atrial fibrillation   Acute on chronic respiratory failure with hypoxia (HCC)   Acute renal failure superimposed on stage 3 chronic kidney disease (HCC)   Biventricular cardiac pacemaker  st Judes  dual chamber with LV in atrial port    Acute on chronic combined systolic and diastolic CHF (congestive heart failure) ([place])   Palliative care  encounter   DNR (do not resuscitate)  Acute on chronic hypercarbic respiratory failure: Multifactorial in the setting of PNA, CHF, and decompensated OSA (hx noncompliance with CPAP). - Continue supplemental oxygen prn and CPAP qHS.  - Diuresing as below  COPD: No wheezing.  - Continue BDs. On steroids, though not directed at this.   Acute on chronic HFrEF (25-30% EF):  - Appreciate HF team assistance. Coox stable off milrinone. Pt and family still consider home with hospice. Deescalating diuresis today. Significant overnight weight loss (?different scales) and net negative over past 24 hours though Cr better and coox stable. - Continue dig, spiro. Holding BB and ACE w/hypotension, AKI - DC foley and monitor UOP - Keep IJ for now, though would welcome HF team's go ahead to discontinue this soon.  Chronic atrial fibrillation: BiV pacing, ICD interrogated earlier, functional.  - No anticoagulation with h/o bleeding  AKI on stage III CKD: Improving with diuresis.  - Continue to monitor BMP daily - Avoid unnecessary nephrotoxins.   Pancytopenia w/ h/o Myelodysplasia low grade:  - Dr. Marin Olp added to treatment team. Won't be on call for a couple weeks. Will discuss with him 12/2.  - Continue PTA medications.   Macrocytic Anemia: Monitor. No overt bleeding  Hypokalemia: Supplementing based on daily labs due to ongoing diuresis.   Acute gout:  - Will continue prednisone burst, if symptoms remit, would need prolongation of steroids.  - Consider ULT at discharge.   DVT prophylaxis: SCDs Code Status: Partial Family Communication: None at bedside this AM Disposition Plan: Likely home with hospice after stabilization from HF perspective.  Consultants:   HF Team  PCCM  Palliative care team  Procedures:  Echo 11/25 > EF 25%, left ventricle severely dilated systolic function was severely reduced.  There is akiiness changes of the lateral inferior myocardium.  Questionable of  possible pericardial cyst.  Structure not noted on follow up CT.  Antimicrobials:  None   Subjective: Feels "fine." Breathing better than previously, not very mobile. Denies any bleeding.   Objective: Vitals:   07/08/18 0831 07/08/18 0855 07/08/18 1236 07/08/18 1500  BP: 99/60  (!) 99/54 (!) 88/56  Pulse: 76  70   Resp: 15     Temp:  (!) 97.3 F (36.3 C)  98.2 F (36.8 C)  TempSrc:  Axillary  Oral  SpO2: 100%  98% 94%  Weight:      Height:        Intake/Output Summary (Last 24 hours) at 07/08/2018 1744 Last data filed at 07/08/2018 1600 Gross per 24 hour  Intake 1264.28 ml  Output 2971 ml  Net -1706.72 ml   Filed Weights   07/06/18 0500 07/07/18 0500 07/08/18 0500  Weight: 90.5 kg 87.4 kg 81.2 kg    Gen: Elderly male in no distress Pulm: Non-labored breathing. Clear but diminished to auscultation bilaterally. No crackles or wheezes noted CV: Regular V paced. No murmur, rub, or gallop. No JVD, no pedal edema. GI: Abdomen soft, non-tender, non-distended, with normoactive bowel sounds. No organomegaly or masses felt. Ext: Warm, no deformities Skin: Right IJ without erythema, tenderness or discharge Neuro: Alert and oriented. No focal neurological deficits. Psych: Judgement and insight appear normal. Mood & affect appropriate.   Data Reviewed: I have personally reviewed following labs and imaging studies  CBC: Recent Labs  Lab 07/04/18 0336 07/05/18 0457 07/06/18 0515 07/07/18 0515 07/08/18 0342  WBC 2.7* 1.7* 1.8* 2.0* 2.1*  NEUTROABS 1.6* 0.9* 0.8* 1.2* 1.2*  HGB 7.9* 7.5* 7.8* 7.9* 9.1*  HCT 25.9* 25.2* 25.3* 26.6* 29.3*  MCV 117.2* 118.9* 118.2* 118.8* 114.9*  PLT 57* 48* 39* 38* 37*   Basic Metabolic Panel: Recent Labs  Lab 07/03/18 0440 07/04/18 0336 07/05/18 0457 07/06/18 0515 07/07/18 0515 07/08/18 0342  NA 136 135 136 137 138 138  K 4.5 4.1 3.6 3.6 3.4* 3.9  CL 98 94* 93* 90* 88* 85*  CO2 33* 34* 37* 39* 43* 43*  GLUCOSE 101* 93 103*  84 112* 97  BUN 80* 74* 69* 63* 64* 68*  CREATININE 1.86* 1.52* 1.52* 1.49* 1.43* 1.38*  CALCIUM 8.6* 8.5* 8.3* 8.4* 8.6* 9.0  MG 2.8* 2.5* 2.4 2.2  --   --   PHOS 4.1 4.7* 4.1 3.3  --   --    GFR: Estimated Creatinine Clearance: 40.6 mL/min (A) (by C-G formula based on SCr of 1.38 mg/dL (H)). Liver Function Tests: No results for input(s): AST, ALT, ALKPHOS, BILITOT, PROT, ALBUMIN in the last 168 hours. No results for input(s): LIPASE, AMYLASE in the last 168 hours. No results for input(s): AMMONIA in the last 168 hours. Coagulation Profile: No results for input(s): INR, PROTIME in the last 168 hours. Cardiac Enzymes: No results for input(s): CKTOTAL, CKMB, CKMBINDEX, TROPONINI in the last 168 hours. BNP (last 3 results) No results for input(s): PROBNP in the last 8760 hours. HbA1C: No results for input(s): HGBA1C in the last 72 hours. CBG: Recent Labs  Lab 07/04/18 2023 07/05/18 0050 07/05/18 0343 07/05/18 0931 07/05/18 1319  GLUCAP 121* 101* 122* 274* 127*   Lipid Profile: No results for input(s): CHOL, HDL, LDLCALC, TRIG, CHOLHDL, LDLDIRECT in the last  72 hours. Thyroid Function Tests: No results for input(s): TSH, T4TOTAL, FREET4, T3FREE, THYROIDAB in the last 72 hours. Anemia Panel: No results for input(s): VITAMINB12, FOLATE, FERRITIN, TIBC, IRON, RETICCTPCT in the last 72 hours. Urine analysis:    Component Value Date/Time   COLORURINE YELLOW 05/05/2018 2247   APPEARANCEUR CLEAR 05/05/2018 2247   LABSPEC 1.008 05/05/2018 2247   PHURINE 5.0 05/05/2018 2247   GLUCOSEU NEGATIVE 05/05/2018 2247   [place] NEGATIVE 05/05/2018 2247   [place] NEGATIVE 05/05/2018 2247   KETONESUR NEGATIVE 05/05/2018 2247   PROTEINUR NEGATIVE 05/05/2018 2247   NITRITE NEGATIVE 05/05/2018 2247   LEUKOCYTESUR NEGATIVE 05/05/2018 2247   No results found for this or any previous visit (from the past 240 hour(s)).    Radiology Studies: No results found.  Scheduled Meds: .  chlorhexidine  15 mL Mouth Rinse BID  . digoxin  0.125 mg Oral Daily  . docusate sodium  100 mg Oral Daily  . escitalopram  5 mg Oral Daily  . lenalidomide  5 mg Oral QHS  . mouth rinse  15 mL Mouth Rinse q12n4p  . pantoprazole  40 mg Oral Daily  . potassium chloride  20 mEq Oral BID  . predniSONE  30 mg Oral Q breakfast  . rOPINIRole  4 mg Oral QHS  . sodium chloride flush  3 mL Intravenous Q12H  . spironolactone  12.5 mg Oral Daily  . [START ON 07/09/2018] torsemide  60 mg Oral Daily   Continuous Infusions: . sodium chloride 10 mL/hr at 07/07/18 0519  . dextrose Stopped (07/03/18 1533)     LOS: 11 days   Time spent: 25 minutes.  Patrecia Pour, MD [facility] [email or web address] Password [facility] 07/08/2018, 5:44 PM

## 2018-07-08 NOTE — Progress Notes (Signed)
Patient noted to be SOB during bedside report, oxygen sat 89% on 3L [place], increased to 4L, sats increased to 96%.  States he is uncomfortable due to needing to urinate.  Condom catheter removed.  Straight cath done for 700cc of amber urine.  Will continue to monitor resp status and U/O.

## 2018-07-08 NOTE — Progress Notes (Signed)
Advanced Heart Failure Rounding Note  PCP-Cardiologist: [name], MD   Subjective:    Milrinone stopped 11/28. On torsemide 60 bid. Weight down 13 pounds overnight (?). Co-ox 69%  Breathing ok. No orthopnea or PND. SBP 90s. CVP 7-8   Cr 1.86 -> 1.52 -> 1.52 -> 1.49 -> 1.43 -> 1.38  Echo 07/04/18 LVEF 25-30%, Mild MR, Severe LAE, Severe RAE, Mild/Mod TR, Mod PI, PA peak pressure 59 mm Hg.   "5x5 cm cystic structure" noted. NOT seen on f/u CT.   Objective:   Weight Range: 81.2 kg Body mass index is 28.04 kg/m.   Vital Signs:   Temp:  [97.3 F (36.3 C)-98.3 F (36.8 C)] 97.3 F (36.3 C) (11/30 0855) Pulse Rate:  [70-76] 70 (11/30 1236) Resp:  [13-20] 15 (11/30 0831) BP: (86-116)/(52-71) 99/54 (11/30 1236) SpO2:  [92 %-100 %] 98 % (11/30 1236) Weight:  [81.2 kg] 81.2 kg (11/30 0500) Last BM Date: 07/07/18  Weight change: Filed Weights   07/06/18 0500 07/07/18 0500 07/08/18 0500  Weight: 90.5 kg 87.4 kg 81.2 kg    Intake/Output:   Intake/Output Summary (Last 24 hours) at 07/08/2018 1253 Last data filed at 07/08/2018 1234 Gross per 24 hour  Intake 1222.28 ml  Output 4751 ml  Net -3528.72 ml    Physical Exam   General:  Well appearing. No resp difficulty HEENT: normal Neck: supple. CVP 6-7 RIJ TLC. Carotids 2+ bilat; no bruits. No lymphadenopathy or thryomegaly appreciated. Cor: PMI nondisplaced. Regular rate & rhythm. No rubs, gallops or murmurs. Lungs: clear with reduced BS throughout Abdomen: soft, nontender, nondistended. No hepatosplenomegaly. No bruits or masses. Good bowel sounds. Extremities: no cyanosis, clubbing, rash, edema Neuro: alert & orientedx3, cranial nerves grossly intact. moves all 4 extremities w/o difficulty. Affect pleasant   Telemetry   BiV paced 70s, personally reviewed.   EKG    No new tracings.    Labs    CBC Recent Labs    07/07/18 0515 07/08/18 0342  WBC 2.0* 2.1*  NEUTROABS 1.2* 1.2*  HGB 7.9* 9.1*  HCT  26.6* 29.3*  MCV 118.8* 114.9*  PLT 38* 37*   Basic Metabolic Panel Recent Labs    07/06/18 0515 07/07/18 0515 07/08/18 0342  NA 137 138 138  K 3.6 3.4* 3.9  CL 90* 88* 85*  CO2 39* 43* 43*  GLUCOSE 84 112* 97  BUN 63* 64* 68*  CREATININE 1.49* 1.43* 1.38*  CALCIUM 8.4* 8.6* 9.0  MG 2.2  --   --   PHOS 3.3  --   --    Liver Function Tests No results for input(s): AST, ALT, ALKPHOS, BILITOT, PROT, ALBUMIN in the last 72 hours. No results for input(s): LIPASE, AMYLASE in the last 72 hours. Cardiac Enzymes No results for input(s): CKTOTAL, CKMB, CKMBINDEX, TROPONINI in the last 72 hours.  BNP: BNP (last 3 results) Recent Labs    01/12/18 0306 05/03/18 0406 06/27/18 1910  BNP 1,360.9* 1,865.0* 1,678.1*    ProBNP (last 3 results) No results for input(s): PROBNP in the last 8760 hours.   D-Dimer No results for input(s): DDIMER in the last 72 hours. Hemoglobin A1C No results for input(s): HGBA1C in the last 72 hours. Fasting Lipid Panel No results for input(s): CHOL, HDL, LDLCALC, TRIG, CHOLHDL, LDLDIRECT in the last 72 hours. Thyroid Function Tests No results for input(s): TSH, T4TOTAL, T3FREE, THYROIDAB in the last 72 hours.  Invalid input(s): FREET3  Other results:   Imaging  No results found.   Medications:     Scheduled Medications: . chlorhexidine  15 mL Mouth Rinse BID  . digoxin  0.125 mg Oral Daily  . docusate sodium  100 mg Oral Daily  . escitalopram  5 mg Oral Daily  . lenalidomide  5 mg Oral QHS  . mouth rinse  15 mL Mouth Rinse q12n4p  . pantoprazole  40 mg Oral Daily  . potassium chloride  20 mEq Oral BID  . predniSONE  30 mg Oral Q breakfast  . rOPINIRole  4 mg Oral QHS  . sodium chloride flush  3 mL Intravenous Q12H  . spironolactone  12.5 mg Oral Daily  . torsemide  60 mg Oral BID    Infusions: . sodium chloride 10 mL/hr at 07/07/18 0519  . dextrose Stopped (07/03/18 1533)    PRN Medications: sodium chloride,  acetaminophen, albuterol, alum & mag hydroxide-simeth, ipratropium-albuterol, loratadine, polyvinyl alcohol, sodium chloride, sodium chloride flush    Patient Profile   Anthony HINZMAN Sr. is a 84 y.o. malewith a hx of PMH of CKD stage III, afib, HTN, HLD, MDS, NICM, symptomatic bradycardia s/p St Jude CRT-P, COPD on home O2, OSA, RLS and pulmonary HTN  Admitted with worsening weakness and DOE. Found to have PNA and acute on chronic systolic CHF.   Assessment/Plan   1. Acute on chronic systolic CHF: Echo 5/32 with EF 25-30%. St Jude CRT-P. Volume overloaded on admit started on milrinone 0.25.  UOP improved on lasix gtt - Echo 07/04/18 LVEF 25-30%, Mild MR, Severe LAE, Severe RAE, Mild/Mod TR, Mod PI, PA peak pressure 59 mm Hg.   "5x5 cm cystic structure" noted. NOT seen on f/u CT.  - Now off milrinone. Co-ox stable at 68%. Will continue to follow - Volume status much improved with CVP 7-8. Renal function improving. Will cut torsemide to 60 daily and follow renal function closely. - Continue spiro 12.5 and digoxin 0.125. - No b-blocker or ACE/ARB/ARNI with decompensation and low BP.  - ICD interrogated in-house and confirmed is BiV pacing appropriately.  - He has had a significant fall in EF from the past, but with MDS/pancytopenia with platelets in the 40s and elevated creatinine, would hold off on coronary angiography.  2. Atrial fibrillation: Chronic - He is rate controlled underneath biV pacing - He has not been anticoagulated due to AVM bleeding.  No change. Continue SCDs 3. ID: - Covering with cefepime for PNA => completed course. Afebrile.  4. AKI on CKD stage 3: - Creatinine 1.8 -> 1.52 -> 1.52 -> 1.49 -> 1.43 -> 1.38 5. Myelodysplastic syndrome: - Pancytopenia. Stable. No bleeding 6. Thrombocytopenia - In setting of #5. Hold Sub Q heparin for now. On SCDs 7. Anemia - Has pancytopenia. Per primary  8. Acute gout - agree with prednisone for 3 day burst. Today is day 3/3 9.  Hypokalemia - K 3.9. Will supp 10. Disposition  - Palliative care following. He seem to be stabilizing from HF perspective.    Length of Stay: 69  Glori Bickers, MD  07/08/2018, 12:53 PM  Advanced Heart Failure Team Pager [phone] (M-F; 7a - 4p)  Please contact [facility] for night-coverage after hours (4p -7a ) and weekends on [email or web address]

## 2018-07-09 LAB — CBC WITH DIFFERENTIAL/PLATELET
ABS IMMATURE GRANULOCYTES: 0 10*3/uL (ref 0.00–0.07)
Basophils Absolute: 0 10*3/uL (ref 0.0–0.1)
Basophils Relative: 1 %
Eosinophils Absolute: 0 10*3/uL (ref 0.0–0.5)
Eosinophils Relative: 2 %
HCT: 30.5 % — ABNORMAL LOW (ref 39.0–52.0)
Hemoglobin: 9.2 g/dL — ABNORMAL LOW (ref 13.0–17.0)
Immature Granulocytes: 0 %
Lymphocytes Relative: 27 %
Lymphs Abs: 0.7 10*3/uL (ref 0.7–4.0)
MCH: 34.8 pg — AB (ref 26.0–34.0)
MCHC: 30.2 g/dL (ref 30.0–36.0)
MCV: 115.5 fL — ABNORMAL HIGH (ref 80.0–100.0)
MONO ABS: 0.4 10*3/uL (ref 0.1–1.0)
Monocytes Relative: 18 %
Neutro Abs: 1.2 10*3/uL — ABNORMAL LOW (ref 1.7–7.7)
Neutrophils Relative %: 52 %
Platelets: 41 10*3/uL — ABNORMAL LOW (ref 150–400)
RBC: 2.64 MIL/uL — ABNORMAL LOW (ref 4.22–5.81)
RDW: 17.6 % — ABNORMAL HIGH (ref 11.5–15.5)
WBC: 2.4 10*3/uL — AB (ref 4.0–10.5)
nRBC: 0 % (ref 0.0–0.2)

## 2018-07-09 LAB — COOXEMETRY PANEL
Carboxyhemoglobin: 2.1 % — ABNORMAL HIGH (ref 0.5–1.5)
Methemoglobin: 1.1 % (ref 0.0–1.5)
O2 Saturation: 63.9 %
TOTAL HEMOGLOBIN: 9.6 g/dL — AB (ref 12.0–16.0)

## 2018-07-09 LAB — BASIC METABOLIC PANEL
Anion gap: 9 (ref 5–15)
BUN: 73 mg/dL — ABNORMAL HIGH (ref 8–23)
CO2: 44 mmol/L — ABNORMAL HIGH (ref 22–32)
Calcium: 8.9 mg/dL (ref 8.9–10.3)
Chloride: 86 mmol/L — ABNORMAL LOW (ref 98–111)
Creatinine, Ser: 1.52 mg/dL — ABNORMAL HIGH (ref 0.61–1.24)
GFR calc Af Amer: 48 mL/min — ABNORMAL LOW (ref >60)
GFR calc non Af Amer: 41 mL/min — ABNORMAL LOW (ref >60)
GLUCOSE: 98 mg/dL (ref 70–99)
Potassium: 4.2 mmol/L (ref 3.5–5.1)
Sodium: 139 mmol/L (ref 135–145)

## 2018-07-09 MED ORDER — TORSEMIDE 20 MG PO TABS: 60.0000 mg | ORAL_TABLET | Freq: Every day | ORAL | 0 refills | Status: DC

## 2018-07-09 MED ORDER — POTASSIUM CHLORIDE CRYS ER 20 MEQ PO TBCR: 20.0000 meq | EXTENDED_RELEASE_TABLET | Freq: Two times a day (BID) | ORAL | 0 refills | Status: AC

## 2018-07-09 MED ORDER — DIGOXIN 125 MCG PO TABS: 0.1250 mg | ORAL_TABLET | Freq: Every day | ORAL | 0 refills | Status: DC

## 2018-07-09 MED ORDER — SPIRONOLACTONE 25 MG PO TABS: 12.5000 mg | ORAL_TABLET | Freq: Every day | ORAL | 0 refills | Status: DC

## 2018-07-09 NOTE — Care Management (Signed)
Discussed home hospice care with patient and Palliative NP.  NP had discussed with daughter, [name], who expressed that she prefers to use [facility] [place]. Referral called to [facility].  [facility] [place] will contact [name] this afternoon and will arrange for visit ASAP tomorrow.  Pt has oxygen at home through [facility] and has hospital bed.

## 2018-07-09 NOTE — Progress Notes (Signed)
[facility]    Notified by Carlynn Purl,  of family request for [facility] services at home after discharge.  Chart and patient information under review by [facility] physician at this time and eligibility is pending.    Spoke with [name] (daughter), confirmed interest, answered questions, provided emotional support, and information regarding hospice services and philosophy.  [name] voiced concern over discharge, the patient is the primary caregiver for her mother as well.    Plan is to discharge later today via POV.   Please send signed and completed DNR home with the patient.   Patient will need prescriptions for discharge comfort medications (if necessary).   DME needs discussed, patient currently has the following equipment in the home:  Bed, BSC, walker, electric wheelchair, oxygen and CPAP  DME that will be needed:  Currently, none   [facility] is aware of the above information.  Completed discharge summary should be faxed to [phone], once eligibility confirmed, and completed.       [facility] information given to [facility].   Above information shared with [facility]   Thank you for this referral Venia Carbon BSN, RN [facility] Liaison (listed in [place]) [phone]?

## 2018-07-09 NOTE — Discharge Instructions (Signed)
Heart Failure °Heart failure means your heart has trouble pumping blood. This makes it hard for your body to work well. Heart failure is usually a long-term (chronic) condition. You must take good care of yourself and follow your doctor's treatment plan. °Follow these instructions at home: °· Take your heart medicine as told by your doctor. °? Do not stop taking medicine unless your doctor tells you to. °? Do not skip any dose of medicine. °? Refill your medicines before they run out. °? Take other medicines only as told by your doctor or pharmacist. °· Stay active if told by your doctor. The elderly and people with severe heart failure should talk with a doctor about physical activity. °· Eat heart-healthy foods. Choose foods that are without trans fat and are low in saturated fat, cholesterol, and salt (sodium). This includes fresh or frozen fruits and vegetables, fish, lean meats, fat-free or low-fat dairy foods, whole grains, and high-fiber foods. Lentils and dried peas and beans (legumes) are also good choices. °· Limit salt if told by your doctor. °· Cook in a healthy way. Roast, grill, broil, bake, poach, steam, or stir-fry foods. °· Limit fluids as told by your doctor. °· Weigh yourself every morning. Do this after you pee (urinate) and before you eat breakfast. Write down your weight to give to your doctor. °· Take your blood pressure and write it down if your doctor tells you to. °· Ask your doctor how to check your pulse. Check your pulse as told. °· Lose weight if told by your doctor. °· Stop smoking or chewing tobacco. Do not use gum or patches that help you quit without your doctor's approval. °· Schedule and go to doctor visits as told. °· Nonpregnant women should have no more than 1 drink a day. Men should have no more than 2 drinks a day. Talk to your doctor about drinking alcohol. °· Stop illegal drug use. °· Stay current with shots (immunizations). °· Manage your health conditions as told by your  doctor. °· Learn to manage your stress. °· Rest when you are tired. °· If it is really hot outside: °? Avoid intense activities. °? Use air conditioning or fans, or get in a cooler place. °? Avoid caffeine and alcohol. °? Wear loose-fitting, lightweight, and light-colored clothing. °· If it is really cold outside: °? Avoid intense activities. °? Layer your clothing. °? Wear mittens or gloves, a hat, and a scarf when going outside. °? Avoid alcohol. °· Learn about heart failure and get support as needed. °· Get help to maintain or improve your quality of life and your ability to care for yourself as needed. °Contact a doctor if: °· You gain weight quickly. °· You are more short of breath than usual. °· You cannot do your normal activities. °· You tire easily. °· You cough more than normal, especially with activity. °· You have any or more puffiness (swelling) in areas such as your hands, feet, ankles, or belly (abdomen). °· You cannot sleep because it is hard to breathe. °· You feel like your heart is beating fast (palpitations). °· You get dizzy or light-headed when you stand up. °Get help right away if: °· You have trouble breathing. °· There is a change in mental status, such as becoming less alert or not being able to focus. °· You have chest pain or discomfort. °· You faint. °This information is not intended to replace advice given to you by your health care provider. Make sure you   discuss any questions you have with your health care provider. Document Released: 05/04/2008 Document Revised: 01/01/2016 Document Reviewed: 09/11/2012 Elsevier Interactive Patient Education  2017 [place] With Less [employer] with less salt is one way to reduce the amount of sodium you get from food. Depending on your condition and overall health, your health care provider or diet and nutrition specialist (dietitian) may recommend that you reduce your sodium intake. Most people should have less than 2,300  milligrams (mg) of sodium each day. If you have high blood pressure (hypertension), you may need to limit your sodium to 1,500 mg each day. Follow the tips below to help reduce your sodium intake. What do I need to know about cooking with less salt? Shopping  Buy sodium-free or low-sodium products. Look for the following words on food labels: ? Low-sodium. ? Sodium-free. ? Reduced-sodium. ? No salt added. ? Unsalted.  Buy fresh or frozen vegetables. Avoid canned vegetables.  Avoid buying meats or protein foods that have been injected with broth or saline solution.  Avoid cured or smoked meats, such as hot dogs, bacon, salami, ham, and bologna. Reading food labels  Check the food label before buying or using packaged ingredients.  Look for products with no more than 140 mg of sodium in one serving.  Do not choose foods with salt as one of the first three ingredients on the ingredients list. If salt is one of the first three ingredients, it usually means the item is high in sodium, because ingredients are listed in order of amount in the food item. Cooking  Use herbs, seasonings without salt, and spices as substitutes for salt in foods.  Use sodium-free baking soda when baking.  Grill, braise, or roast foods to add flavor with less salt.  Avoid adding salt to pasta, rice, or hot cereals while cooking.  Drain and rinse canned vegetables before use.  Avoid adding salt when cooking sweets and desserts.  Cook with low-sodium ingredients. What are some salt alternatives? The following are herbs, seasonings, and spices that can be used instead of salt to give taste to your food. Herbs should be fresh or dried. Do not choose packaged mixes. Next to the name of the herb, spice, or seasoning are some examples of foods you can pair it with. Herbs  Bay leaves - Soups, meat and vegetable dishes, and spaghetti sauce.  Basil - [employer], soups, pasta, and fish dishes.  Cilantro -  Meat, poultry, and vegetable dishes.  Chili powder - Marinades and Mexican dishes.  Chives - Salad dressings and potato dishes.  Cumin - Mexican dishes, couscous, and meat dishes.  Dill - Fish dishes, sauces, and salads.  Fennel - Meat and vegetable dishes, breads, and cookies.  Garlic (do not use garlic salt) - [place] dishes, meat dishes, salad dressings, and sauces.  Marjoram - Soups, potato dishes, and meat dishes.  Oregano - Pizza and spaghetti sauce.  Parsley - Salads, soups, pasta, and meat dishes.  Rosemary - [place] dishes, salad dressings, soups, and red meats.  Saffron - Fish dishes, pasta, and some poultry dishes.  Sage - Stuffings and sauces.  Tarragon - Fish and [employer].  Thyme - Stuffing, meat, and fish dishes. Seasonings  Lemon juice - Fish dishes, poultry dishes, vegetables, and salads.  Vinegar - Salad dressings, vegetables, and fish dishes. Spices  Cinnamon - Sweet dishes, such as cakes, cookies, and puddings.  Cloves - Gingerbread, puddings, and marinades for meats.  Georgann Housekeeper -  Vegetable dishes, fish and poultry dishes, and stir-fry dishes.  Ginger - Vegetables dishes, fish dishes, and stir-fry dishes.  Nutmeg - Pasta, vegetables, poultry, fish dishes, and custard. What are some low-sodium ingredients and foods?  Fresh or frozen fruits and vegetables with no sauce added.  Fresh or frozen whole meats, poultry, and fish with no sauce added.  Eggs.  Noodles, pasta, quinoa, rice.  Shredded or puffed wheat or puffed rice.  Regular or quick oats.  Milk, yogurt, hard cheeses, and low-sodium cheeses. Good cheese choices include Swiss, [place]. Always check the label for the serving size and sodium content.  Unsalted butter or margarine.  Unsalted nuts.  Sherbet or ice cream (keep to  cup per serving).  Homemade pudding.  Sodium-free baking soda and baking powder. This is not a complete list of low-sodium  ingredients and foods. Contact your dietitian for more options. Summary  Cooking with less salt is one way to reduce the amount of sodium that you get from food.  Buy sodium-free or low-sodium products.  Check the food label before using or buying packaged ingredients.  Use herbs, seasonings without salt, and spices as substitutes for salt in foods. This information is not intended to replace advice given to you by your health care provider. Make sure you discuss any questions you have with your health care provider. Document Released: 07/26/2005 Document Revised: 08/03/2016 Document Reviewed: 08/03/2016 Elsevier Interactive Patient Education  2017 [employer].

## 2018-07-09 NOTE — Progress Notes (Signed)
Advanced Heart Failure Rounding Note  PCP-Cardiologist: [name], MD   Subjective:    Milrinone stopped 11/28. Torsemide decreased from 60 bid to 60 daily yesterday. (was on lasix 80 bid at home)  Weight down 2 pounds overnight. Co-ox 64%  Developed recurrent urinary retention overnight.    Currently denies SOB, orthopnea or PND.    Cr 1.86 -> 1.52 -> 1.52 -> 1.49 -> 1.43 -> 1.38 -> 1.52  Echo 07/04/18 LVEF 25-30%, Mild MR, Severe LAE, Severe RAE, Mild/Mod TR, Mod PI, PA peak pressure 59 mm Hg.   "5x5 cm cystic structure" noted. NOT seen on f/u CT.   Objective:   Weight Range: 80.4 kg Body mass index is 27.75 kg/m.   Vital Signs:   Temp:  [98 F (36.7 C)-98.9 F (37.2 C)] 98.9 F (37.2 C) (12/01 0821) Pulse Rate:  [70-78] 72 (12/01 0946) Resp:  [15-18] 16 (12/01 0233) BP: (88-112)/(53-68) 99/63 (12/01 0821) SpO2:  [94 %-99 %] 96 % (12/01 0821) Weight:  [80.4 kg] 80.4 kg (12/01 0233) Last BM Date: 07/07/18  Weight change: Filed Weights   07/07/18 0500 07/08/18 0500 07/09/18 0233  Weight: 87.4 kg 81.2 kg 80.4 kg    Intake/Output:   Intake/Output Summary (Last 24 hours) at 07/09/2018 1015 Last data filed at 07/09/2018 0300 Gross per 24 hour  Intake 512 ml  Output 1920 ml  Net -1408 ml    Physical Exam   General:  Elderly male lying in bed  No resp difficulty HEENT: normal Neck: supple. RIJ TLC  CVP 5-6 Carotids 2+ bilat; no bruits. No lymphadenopathy or thryomegaly appreciated. Cor: PMI nondisplaced. Regular rate & rhythm.2/6 TR  Lungs: clear but decreased breath sounds throughout  Abdomen: soft, nontender, nondistended. No hepatosplenomegaly. No bruits or masses. Good bowel sounds. Extremities: no cyanosis, clubbing, rash, edema +SCDs Neuro: alert & orientedx3, cranial nerves grossly intact. moves all 4 extremities w/o difficulty. Affect pleasant   Telemetry   BiV paced 70s, Personally reviewed   EKG    No new tracings.    Labs     CBC Recent Labs    07/08/18 0342 07/09/18 0407  WBC 2.1* 2.4*  NEUTROABS 1.2* 1.2*  HGB 9.1* 9.2*  HCT 29.3* 30.5*  MCV 114.9* 115.5*  PLT 37* 41*   Basic Metabolic Panel Recent Labs    07/08/18 0342 07/09/18 0407  NA 138 139  K 3.9 4.2  CL 85* 86*  CO2 43* 44*  GLUCOSE 97 98  BUN 68* 73*  CREATININE 1.38* 1.52*  CALCIUM 9.0 8.9   Liver Function Tests No results for input(s): AST, ALT, ALKPHOS, BILITOT, PROT, ALBUMIN in the last 72 hours. No results for input(s): LIPASE, AMYLASE in the last 72 hours. Cardiac Enzymes No results for input(s): CKTOTAL, CKMB, CKMBINDEX, TROPONINI in the last 72 hours.  BNP: BNP (last 3 results) Recent Labs    01/12/18 0306 05/03/18 0406 06/27/18 1910  BNP 1,360.9* 1,865.0* 1,678.1*    ProBNP (last 3 results) No results for input(s): PROBNP in the last 8760 hours.   D-Dimer No results for input(s): DDIMER in the last 72 hours. Hemoglobin A1C No results for input(s): HGBA1C in the last 72 hours. Fasting Lipid Panel No results for input(s): CHOL, HDL, LDLCALC, TRIG, CHOLHDL, LDLDIRECT in the last 72 hours. Thyroid Function Tests No results for input(s): TSH, T4TOTAL, T3FREE, THYROIDAB in the last 72 hours.  Invalid input(s): FREET3  Other results:   Imaging    No results found.  Medications:     Scheduled Medications: . chlorhexidine  15 mL Mouth Rinse BID  . digoxin  0.125 mg Oral Daily  . docusate sodium  100 mg Oral Daily  . escitalopram  5 mg Oral Daily  . lenalidomide  5 mg Oral QHS  . mouth rinse  15 mL Mouth Rinse q12n4p  . pantoprazole  40 mg Oral Daily  . potassium chloride  20 mEq Oral BID  . rOPINIRole  4 mg Oral QHS  . sodium chloride flush  3 mL Intravenous Q12H  . spironolactone  12.5 mg Oral Daily  . torsemide  60 mg Oral Daily    Infusions: . sodium chloride 10 mL/hr at 07/07/18 0519  . dextrose Stopped (07/03/18 1533)    PRN Medications: sodium chloride, acetaminophen, albuterol,  alum & mag hydroxide-simeth, ipratropium-albuterol, loratadine, polyvinyl alcohol, sodium chloride, sodium chloride flush    Patient Profile   Anthony SCHWINN Sr. is a 84 y.o. malewith a hx of PMH of CKD stage III, afib, HTN, HLD, MDS, NICM, symptomatic bradycardia s/p St Jude CRT-P, COPD on home O2, OSA, RLS and pulmonary HTN  Admitted with worsening weakness and DOE. Found to have PNA and acute on chronic systolic CHF.   Assessment/Plan   1. Acute on chronic systolic CHF: Echo 5/03 with EF 25-30%. St Jude CRT-P. Volume overloaded on admit started on milrinone 0.25.  UOP improved on lasix gtt - Echo 07/04/18 LVEF 25-30%, Mild MR, Severe LAE, Severe RAE, Mild/Mod TR, Mod PI, PA peak pressure 59 mm Hg.   "5x5 cm cystic structure" noted. NOT seen on f/u CT.  - Now off milrinone. Co-ox stable at 64%. Will continue to follow - Volume status much improved with CVP 5-6. Now on torsemide 60 daily (was on lasix 80 bid at home) and seems to be doing well. Creatinine relatively stable. Will follow. - Continue spiro 12.5 and digoxin 0.125. - No b-blocker or ACE/ARB/ARNI with decompensation and low BP.  - ICD interrogated in-house and confirmed is BiV pacing appropriately.  - He has had a significant fall in EF from the past, but with MDS/pancytopenia with platelets in the 40s and elevated creatinine, would hold off on coronary angiography.  2. Atrial fibrillation: Chronic - He is rate controlled underneath biV pacing - He has not been anticoagulated due to AVM bleeding.  No change. Continue SCDs 3. ID: - Completed course od cefipime. Afebrile. WBC 2.4 4. AKI on CKD stage 3: - Creatinine 1.8 -> 1.52 -> 1.52 -> 1.49 -> 1.43 -> 1.38 -> 1.52 5. Myelodysplastic syndrome: - Pancytopenia. Stable. No bleeding 6. Thrombocytopenia - In setting of #5. Hold Sub Q heparin for now. On SCDs 7. Anemia - Has pancytopenia. Per primary  8. Acute gout - complete prednisone burst 3/3 days  9. Hypokalemia - K  4.2 today.  10. Urinary retention - per primary/ Would consider tamulosin.  10. Disposition  - Palliative care following. He seem to be stabilizing from HF perspective. We will follow at a distance. Ok to pull central line and d/c home from HF perspective.    Length of Stay: [place], MD  07/09/2018, 10:15 AM  Advanced Heart Failure Team Pager [phone] (M-F; 7a - 4p)  Please contact [facility] for night-coverage after hours (4p -7a ) and weekends on [email or web address]

## 2018-07-09 NOTE — Discharge Summary (Signed)
Physician Discharge Summary  [name] Sr. MOQ:[id number] DOB: 1932/07/04 DOA: 06/27/2018  PCP: [name], MD  Admit date: 06/27/2018 Discharge date: 07/09/2018  Admitted From: Home Disposition: Home with hospice   Recommendations for Outpatient Follow-up:  1. Follow up with PCP, heart failure clinic, and hospice.  Home Health: Hospice Equipment/Devices: Per hospice Discharge Condition: Stable CODE STATUS: DNR Diet recommendation: As tolerated  Brief/Interim Summary: Anthony Agar Sr. is an 84 year old male with past medical history as below, which is significant for COPD (on 3 L supplemental oxygen at home), systolic congestive heart failure with recent ejection fraction 25 to 30%, CKD stage III and myelodysplasia. He was admitted to [facility] on 11/19 with complaints of several days shortness of breath and progressive weakness. Imaging upon admission was concerning for pneumonia and pleural effusion. He was admitted for treatment of presumed pneumonia and treated with empiric antibiotics. Inpatient course became complicated by worsening dyspnea which improved with IV diuretics. His blood pressure remained borderline so he was difficult to diurese. On 11/23 cardiology was consulted for further evaluation who felt he had low cardiac output despite hypervolemia. IV milrinone was started in hopes that he would be able to be diuresed. He was transferred to ICU for continued shortness of breath requiring BiPAP and the initiation of milrinone infusion. He required pressors off and on 11/25 - 11/27. Palliative care was consulted and facilitated discussions regarding home hospice beginning 11/29. He was transferred after stabilization to the stepdown unit 11/29 onto the hospitalist team 11/30. With continued titration, his diuretic regimen was optimized though he remains severely symptomatic and felt to be appropriate for hospice. He and his family wish for him to  discharge to home. Palliative care consultant has been very helpful and care management has arranged home hospice through [facility].  Discharge Diagnoses:  Principal Problem:   PNA (pneumonia) Active Problems:   Nonischemic cardiomyopathy - coronaries by angiography 3546   Chronic systolic heart failure (HCC)   Chronic atrial fibrillation   Acute on chronic respiratory failure with hypoxia (HCC)   Acute renal failure superimposed on stage 3 chronic kidney disease (HCC)   Biventricular cardiac pacemaker  st Judes  dual chamber with LV in atrial port    Acute on chronic combined systolic and diastolic CHF (congestive heart failure) ([place])   Palliative care encounter   DNR (do not resuscitate)  Acute on chronic hypercarbic respiratory failure: Multifactorial in the setting of PNA, CHF, and decompensated OSA (hx noncompliance with CPAP). - Continue supplemental oxygen prn and CPAP qHS.  - Diuresing as below  COPD: No wheezing.  - Continue BDs. On steroids, though not directed at this.   Acute on chronic HFrEF (25-30% EF):  - Appreciate HF team assistance. Coox stable off milrinone. Pt and family still desire home with hospice. Deescalating diuresis today.  - Discussed with Dr. Haroldine Laws who fels patient is optimized for discharge. Will continue medications per his recommendations.   Chronic atrial fibrillation: BiV pacing, ICD interrogated earlier, functional.  - No anticoagulation with h/o bleeding  AKI on stage III CKD: Improving with diuresis.  - Avoid unnecessary nephrotoxins.   Pancytopenia w/ h/oMyelodysplasia low grade:  - Dr. Marin Olp added to treatment team. Given hospice goal of care, recommended to stop revlimid, though could continue discussions with oncology.  Macrocytic Anemia: Monitor. No overt bleeding  Hypokalemia: Supplementing based on daily labs due to ongoing diuresis.   Acute gout: Resolved with short prednisone burst.  - Consider ULT  at follow up vs.  supportive care only.   Discharge Instructions Discharge Instructions    Diet - low sodium heart healthy   Complete by:  As directed    Discharge instructions   Complete by:  As directed    Medication changes have been made as below per the heart failure team. It is recommended that you continue with the foley catheter until you follow up with your doctor and continue flomax. Home hospice services will be arranged prior to your discharge.   Increase activity slowly   Complete by:  As directed      Allergies as of 07/09/2018      Reactions   Vicodin [hydrocodone-acetaminophen] Itching, Nausea Only      Medication List    STOP taking these medications   aspirin 81 MG EC tablet   furosemide 80 MG tablet Commonly known as:  LASIX   potassium chloride 10 MEQ tablet Commonly known as:  K-DUR     TAKE these medications   acetaminophen 650 MG CR tablet Commonly known as:  TYLENOL Take 1,300 mg by mouth every 8 (eight) hours as needed for pain.   allopurinol 100 MG tablet Commonly known as:  ZYLOPRIM Take 2 tablets (200 mg total) by mouth daily. What changed:    when to take this  reasons to take this   ARTIFICIAL TEARS PF OP Place 2 drops into both eyes 3 (three) times daily as needed (for dryness).   digoxin 0.125 MG tablet Commonly known as:  LANOXIN Take 1 tablet (0.125 mg total) by mouth daily. Start taking on:  07/10/2018   diphenhydrAMINE-zinc acetate cream Commonly known as:  BENADRYL Apply topically 2 (two) times daily as needed for itching.   dutasteride 0.5 MG capsule Commonly known as:  AVODART Take 1 capsule (0.5 mg total) by mouth daily.   escitalopram 5 MG tablet Commonly known as:  LEXAPRO Take 1 tablet (5 mg total) by mouth at bedtime.   ipratropium-albuterol 0.5-2.5 (3) MG/3ML Soln Commonly known as:  DUONEB Take 3 mLs by nebulization every 4 (four) hours as needed. What changed:  reasons to take this   loratadine 10 MG tablet Commonly  known as:  CLARITIN Take 10 mg by mouth daily as needed for allergies or itching.   mirtazapine 15 MG tablet Commonly known as:  REMERON Take 1 tablet (15 mg total) by mouth at bedtime.   multivitamin with minerals Tabs tablet Take 1 tablet by mouth daily.   OXYGEN Inhale 3 L into the lungs as needed (for shortness of breath).   pantoprazole 40 MG tablet Commonly known as:  PROTONIX Take 1 tablet (40 mg total) by mouth daily.   potassium chloride SA 20 MEQ tablet Commonly known as:  K-DUR,KLOR-CON Take 1 tablet (20 mEq total) by mouth 2 (two) times daily.   PRESERVISION AREDS 2 Caps Take 1 capsule by mouth daily.   REVLIMID 5 MG capsule Generic drug:  lenalidomide TAKE 1 CAPSULE BY MOUTH EVERY NIGHT AT BEDTIME FOR 21 DAYS THEN 7 DAYS OFF What changed:  See the new instructions.   rOPINIRole 4 MG tablet Commonly known as:  REQUIP Take 1 tablet (4 mg total) by mouth at bedtime.   spironolactone 25 MG tablet Commonly known as:  ALDACTONE Take 0.5 tablets (12.5 mg total) by mouth daily. Start taking on:  07/10/2018   tamsulosin 0.4 MG Caps capsule Commonly known as:  FLOMAX Take 1 capsule (0.4 mg total) by mouth daily after breakfast.  torsemide 20 MG tablet Commonly known as:  DEMADEX Take 3 tablets (60 mg total) by mouth daily. Start taking on:  07/10/2018   triamcinolone cream 0.1 % Commonly known as:  KENALOG Apply 1 application topically 2 (two) times daily. What changed:    when to take this  additional instructions   VENTOLIN HFA 108 (90 Base) MCG/ACT inhaler Generic drug:  albuterol Inhale 2 puffs into the lungs every 6 (six) hours as needed for wheezing or shortness of breath.      Follow-up Information    [name], [name] Him, MD Follow up.   Specialty:  Internal Medicine Contact information: [place] [address] [phone]        [name], MD .   Specialty:  Cardiology Contact information: [address] [place] [address] [phone]        Volanda Napoleon, MD Follow up.   Specialty:  Oncology Contact information: [place] [address] [phone]          Allergies  Allergen Reactions  . Vicodin [Hydrocodone-Acetaminophen] Itching and Nausea Only    Consultations:  Heart failure team  PCCM  Palliative care  Procedures/Studies: Dg Chest 1 View  Result Date: 07/01/2018 CLINICAL DATA:  Central line placement EXAM: CHEST  1 VIEW COMPARISON:  07/01/2018 FINDINGS: Right IJ central venous catheter tip is at the cavoatrial junction. Moderate cardiomegaly is unchanged. Left chest wall pacemaker leads are unchanged in position. There is left-greater-than-right basilar opacity, likely atelectasis. Severe osteoarthrosis of the right shoulder. IMPRESSION: Right IJ central venous catheter tip at the cavoatrial junction. Electronically Signed   By: Ulyses Jarred M.D.   On: 07/01/2018 22:56   Ct Chest Wo Contrast  Result Date: 07/04/2018 CLINICAL DATA:  84 year old male with history of cystic structure posterior to the left ventricle in left atrium noted on recent echocardiogram. EXAM: CT CHEST WITHOUT CONTRAST TECHNIQUE: Multidetector CT imaging of the chest was performed following the standard protocol without IV contrast. COMPARISON:  None. FINDINGS: Cardiovascular: Heart size is enlarged with left ventricular and left atrial dilatation. There is no significant pericardial fluid, thickening or pericardial calcification. There is aortic atherosclerosis, as well as atherosclerosis of the great vessels of the mediastinum and the coronary arteries, including calcified atherosclerotic plaque in the left main, left anterior descending, left circumflex and right coronary arteries. Calcifications of the aortic valve. Left-sided biventricular pacemaker with lead tips terminating in the right ventricular apex and overlying the anterior wall of the  left ventricle via the coronary sinus and coronary veins. Dilatation of the pulmonic trunk (4.1 cm in diameter). Mediastinum/Nodes: No pathologically enlarged mediastinal or hilar lymph nodes. Please note that accurate exclusion of hilar adenopathy is limited on noncontrast CT scans. Esophagus is unremarkable in appearance. No axillary lymphadenopathy. Lungs/Pleura: Small to moderate bilateral pleural effusions lying dependently. Associated passive subsegmental atelectasis throughout the dependent portions of the lung bases bilaterally. A few patchy areas of ground-glass attenuation are also noted in the dependent portions of the upper lobes of the lungs bilaterally. No definite suspicious appearing pulmonary nodules or masses are noted. Upper Abdomen: Aortic atherosclerosis. Musculoskeletal: There are no aggressive appearing lytic or blastic lesions noted in the visualized portions of the skeleton. IMPRESSION: 1. No cystic structure identified in the lower left hemithorax to account for the perceived abnormality on the recent echocardiogram. 2. Small to moderate bilateral pleural effusions lying dependently. Extensive areas of passive atelectasis or noted throughout the  dependent portions of the lungs bilaterally. 3. Additional patchy areas of ground-glass attenuation in the dependent portions of the upper lobes of the lungs bilaterally. This is nonspecific, and could be infectious or inflammatory in etiology. 4. Dilatation of the pulmonic trunk (4.1 cm in diameter), concerning for pulmonary arterial hypertension. 5. Cardiomegaly with left ventricular and left atrial dilatation. Aortic Atherosclerosis (ICD10-I70.0). Electronically Signed   By: Vinnie Langton M.D.   On: 07/04/2018 16:27   Dg Chest Port 1 View  Result Date: 07/06/2018 CLINICAL DATA:  Respiratory failure EXAM: PORTABLE CHEST 1 VIEW COMPARISON:  CT from 07/04/2018 FINDINGS: Cardiac shadow remains enlarged. Pacing device and right jugular central  line are seen and stable. The lungs are well aerated. The previously seen effusions and lower lobe atelectatic changes have improved when compare with the prior exam particularly on the right and to a lesser degree on the left. Continued follow-up is recommended. No bony abnormality is seen. IMPRESSION: Improved aeration in the bases bilaterally. Continued follow-up is recommended. Electronically Signed   By: Inez Catalina M.D.   On: 07/06/2018 07:57   Dg Chest Port 1 View  Result Date: 07/01/2018 CLINICAL DATA:  Shortness of breath EXAM: PORTABLE CHEST 1 VIEW COMPARISON:  06/27/2018 FINDINGS: Cardiomegaly, pacemaker and mild pulmonary vascular congestion again noted in this low volume film. Bibasilar opacities/atelectasis are unchanged. There may be trace bilateral pleural effusions present. There is no evidence of pneumothorax. IMPRESSION: Unchanged appearance of the chest with cardiomegaly, mild pulmonary vascular congestion, bibasilar opacities/atelectasis and possible trace pleural effusions. Electronically Signed   By: Margarette Canada M.D.   On: 07/01/2018 08:37   Dg Chest Portable 1 View  Result Date: 06/27/2018 CLINICAL DATA:  Shortness of breath EXAM: PORTABLE CHEST 1 VIEW COMPARISON:  May 03, 2018 FINDINGS: There is consolidation in the left base with small left pleural effusion. There is mild right base atelectasis. There is cardiomegaly. Pacemaker leads are attached the right ventricle and coronary sinus. There is aortic atherosclerosis. No adenopathy. There is postoperative change in the right shoulder. IMPRESSION: Consolidation left base with small left pleural effusion. Suspect pneumonia left base. There is mild atelectasis in the right base. Cardiomegaly with pacemaker leads stable. There is aortic atherosclerosis. No evident adenopathy. Aortic Atherosclerosis (ICD10-I70.0). Electronically Signed   By: Lowella Grip III M.D.   On: 06/27/2018 19:35     Echo 11/25 >EF 25%, left  ventricle severely dilated systolic function was severely reduced. There is akinesis changes of the lateral inferior myocardium. Questionable of possible pericardial cyst.  Structure not noted on follow up CT.  Subjective: Feels better than yesterday, wants to go home. No new issues.   Discharge Exam: Vitals:   07/09/18 0821 07/09/18 0946  BP: 99/63   Pulse:  72  Resp:    Temp: 98.9 F (37.2 C)   SpO2: 96%    Gen: Elderly male in no distress Pulm: Non-labored breathing. Clear but diminished to auscultation bilaterally. No crackles or wheezes noted CV: Regular V paced. No murmur, rub, or gallop. No JVD, no pedal edema. GI: Abdomen soft, non-tender, non-distended, with normoactive bowel sounds. No organomegaly or masses felt. Ext: Warm, no deformities Skin: Right IJ without erythema, tenderness or discharge Neuro: Alert and oriented. No focal neurological deficits. Psych: Judgement and insight appear normal. Mood & affect appropriate.   Labs: BNP (last 3 results) Recent Labs    01/12/18 0306 05/03/18 0406 06/27/18 1910  BNP 1,360.9* 1,865.0* 0,960.4*   Basic Metabolic Panel: Recent Labs  Lab 07/03/18 0440 07/04/18 0336 07/05/18 0457 07/06/18 0515 07/07/18 0515 07/08/18 0342 07/09/18 0407  NA 136 135 136 137 138 138 139  K 4.5 4.1 3.6 3.6 3.4* 3.9 4.2  CL 98 94* 93* 90* 88* 85* 86*  CO2 33* 34* 37* 39* 43* 43* 44*  GLUCOSE 101* 93 103* 84 112* 97 98  BUN 80* 74* 69* 63* 64* 68* 73*  CREATININE 1.86* 1.52* 1.52* 1.49* 1.43* 1.38* 1.52*  CALCIUM 8.6* 8.5* 8.3* 8.4* 8.6* 9.0 8.9  MG 2.8* 2.5* 2.4 2.2  --   --   --   PHOS 4.1 4.7* 4.1 3.3  --   --   --    CBC: Recent Labs  Lab 07/05/18 0457 07/06/18 0515 07/07/18 0515 07/08/18 0342 07/09/18 0407  WBC 1.7* 1.8* 2.0* 2.1* 2.4*  NEUTROABS 0.9* 0.8* 1.2* 1.2* 1.2*  HGB 7.5* 7.8* 7.9* 9.1* 9.2*  HCT 25.2* 25.3* 26.6* 29.3* 30.5*  MCV 118.9* 118.2* 118.8* 114.9* 115.5*  PLT 48* 39* 38* 37* 41*    CBG: Recent Labs  Lab 07/04/18 2023 07/05/18 0050 07/05/18 0343 07/05/18 0931 07/05/18 1319  GLUCAP 121* 101* 122* 274* 127*   Time coordinating discharge: Approximately 40 minutes  Patrecia Pour, MD  [facility] 07/09/2018, 12:10 PM Pager [phone]

## 2018-07-09 NOTE — Progress Notes (Signed)
Patient ID: [name], male   DOB: 07/23/1932, 84 y.o.   MRN: [id number]  Received call from daughter/ [facility].  Reviewed medical records, assess patient.  Returned call to daughter for continued conversation regarding current medical situation and anticipatory care needs.  Family was notified the patient would be discharged home today and there was concern that hospice benefit had not been set up at this point.  Had a detailed conversation regarding hospice benefit.  Patient and family are hopeful for hospice services in the home.  Will write for case management to offer choice; however family has already verbalized that [facility] is their first choice.  Family is hopeful for conversation with hospice as soon as possible.  Comfort and dignity are priorities of care.   I personally spoke with [facility] to ensure referral   There is still outstanding question regarding stopping the Revlimid.  Discussed with daughter the importance of continued conversation with the  medical providers regarding overall plan of care and treatment options,  ensuring decisions are within the context of the patients values and GOCs.  Questions and concerns addressed   Discussed with Dr Auburn Bilberry  Total time spent on the unit was 35 minutes  Greater than 50% of the time was spent in counseling and coordination of care  Wadie Lessen NP  Palliative Medicine Team Team Phone # [phone] Pager [phone]

## 2018-07-10 ENCOUNTER — Telehealth (HOSPITAL_COMMUNITY): Payer: Self-pay | Admitting: Surgery

## 2018-07-10 ENCOUNTER — Other Ambulatory Visit: Payer: Self-pay

## 2018-07-10 ENCOUNTER — Ambulatory Visit: Payer: Self-pay

## 2018-07-10 ENCOUNTER — Telehealth: Payer: Self-pay | Admitting: Family

## 2018-07-10 ENCOUNTER — Ambulatory Visit: Payer: Self-pay | Admitting: Family

## 2018-07-10 DIAGNOSIS — D649 Anemia, unspecified: Secondary | ICD-10-CM | POA: Diagnosis not present

## 2018-07-10 DIAGNOSIS — I5020 Unspecified systolic (congestive) heart failure: Secondary | ICD-10-CM | POA: Diagnosis not present

## 2018-07-10 DIAGNOSIS — M109 Gout, unspecified: Secondary | ICD-10-CM | POA: Diagnosis not present

## 2018-07-10 DIAGNOSIS — I4891 Unspecified atrial fibrillation: Secondary | ICD-10-CM | POA: Diagnosis not present

## 2018-07-10 DIAGNOSIS — J449 Chronic obstructive pulmonary disease, unspecified: Secondary | ICD-10-CM | POA: Diagnosis not present

## 2018-07-10 DIAGNOSIS — J9620 Acute and chronic respiratory failure, unspecified whether with hypoxia or hypercapnia: Secondary | ICD-10-CM | POA: Diagnosis not present

## 2018-07-10 DIAGNOSIS — I110 Hypertensive heart disease with heart failure: Secondary | ICD-10-CM | POA: Diagnosis not present

## 2018-07-10 DIAGNOSIS — I270 Primary pulmonary hypertension: Secondary | ICD-10-CM | POA: Diagnosis not present

## 2018-07-10 DIAGNOSIS — N183 Chronic kidney disease, stage 3 (moderate): Secondary | ICD-10-CM | POA: Diagnosis not present

## 2018-07-10 DIAGNOSIS — D61818 Other pancytopenia: Secondary | ICD-10-CM | POA: Diagnosis not present

## 2018-07-10 NOTE — Telephone Encounter (Signed)
I called [name] and spoke to his wife.  I scheduled a follow-up appt in the [facility] on 12/17 at 1:30pm per her request.

## 2018-07-10 NOTE — Telephone Encounter (Signed)
Received message form after hours service. Pt called to cancel 12/2 appts due to being hospitalzed

## 2018-07-11 ENCOUNTER — Other Ambulatory Visit: Payer: Self-pay | Admitting: *Deleted

## 2018-07-11 DIAGNOSIS — D46Z Other myelodysplastic syndromes: Secondary | ICD-10-CM

## 2018-07-11 DIAGNOSIS — I5020 Unspecified systolic (congestive) heart failure: Secondary | ICD-10-CM | POA: Diagnosis not present

## 2018-07-11 DIAGNOSIS — I270 Primary pulmonary hypertension: Secondary | ICD-10-CM | POA: Diagnosis not present

## 2018-07-11 DIAGNOSIS — I110 Hypertensive heart disease with heart failure: Secondary | ICD-10-CM | POA: Diagnosis not present

## 2018-07-11 DIAGNOSIS — N183 Chronic kidney disease, stage 3 (moderate): Secondary | ICD-10-CM | POA: Diagnosis not present

## 2018-07-11 DIAGNOSIS — J9620 Acute and chronic respiratory failure, unspecified whether with hypoxia or hypercapnia: Secondary | ICD-10-CM | POA: Diagnosis not present

## 2018-07-11 DIAGNOSIS — J449 Chronic obstructive pulmonary disease, unspecified: Secondary | ICD-10-CM | POA: Diagnosis not present

## 2018-07-11 MED ORDER — LENALIDOMIDE 5 MG PO CAPS: ORAL_CAPSULE | ORAL | 0 refills | Status: DC

## 2018-07-12 DIAGNOSIS — I5020 Unspecified systolic (congestive) heart failure: Secondary | ICD-10-CM | POA: Diagnosis not present

## 2018-07-12 DIAGNOSIS — J9620 Acute and chronic respiratory failure, unspecified whether with hypoxia or hypercapnia: Secondary | ICD-10-CM | POA: Diagnosis not present

## 2018-07-12 DIAGNOSIS — I110 Hypertensive heart disease with heart failure: Secondary | ICD-10-CM | POA: Diagnosis not present

## 2018-07-12 DIAGNOSIS — I270 Primary pulmonary hypertension: Secondary | ICD-10-CM | POA: Diagnosis not present

## 2018-07-12 DIAGNOSIS — N183 Chronic kidney disease, stage 3 (moderate): Secondary | ICD-10-CM | POA: Diagnosis not present

## 2018-07-12 DIAGNOSIS — J449 Chronic obstructive pulmonary disease, unspecified: Secondary | ICD-10-CM | POA: Diagnosis not present

## 2018-07-14 DIAGNOSIS — N183 Chronic kidney disease, stage 3 (moderate): Secondary | ICD-10-CM | POA: Diagnosis not present

## 2018-07-14 DIAGNOSIS — I110 Hypertensive heart disease with heart failure: Secondary | ICD-10-CM | POA: Diagnosis not present

## 2018-07-14 DIAGNOSIS — I5020 Unspecified systolic (congestive) heart failure: Secondary | ICD-10-CM | POA: Diagnosis not present

## 2018-07-14 DIAGNOSIS — J9620 Acute and chronic respiratory failure, unspecified whether with hypoxia or hypercapnia: Secondary | ICD-10-CM | POA: Diagnosis not present

## 2018-07-14 DIAGNOSIS — I270 Primary pulmonary hypertension: Secondary | ICD-10-CM | POA: Diagnosis not present

## 2018-07-14 DIAGNOSIS — J449 Chronic obstructive pulmonary disease, unspecified: Secondary | ICD-10-CM | POA: Diagnosis not present

## 2018-07-18 ENCOUNTER — Telehealth: Payer: Self-pay | Admitting: *Deleted

## 2018-07-18 NOTE — Telephone Encounter (Signed)
Spoke with daughter [name] regarding Revlimid. Per [name], patient is home with Hospice. Revlimid has been discontinued.   I called [facility] and spoke with Delle Reining, [facility], and told her to discontinue the Revlimid. She verbalized understanding.

## 2018-07-19 DIAGNOSIS — J9620 Acute and chronic respiratory failure, unspecified whether with hypoxia or hypercapnia: Secondary | ICD-10-CM | POA: Diagnosis not present

## 2018-07-19 DIAGNOSIS — I110 Hypertensive heart disease with heart failure: Secondary | ICD-10-CM | POA: Diagnosis not present

## 2018-07-19 DIAGNOSIS — N183 Chronic kidney disease, stage 3 (moderate): Secondary | ICD-10-CM | POA: Diagnosis not present

## 2018-07-19 DIAGNOSIS — I5020 Unspecified systolic (congestive) heart failure: Secondary | ICD-10-CM | POA: Diagnosis not present

## 2018-07-19 DIAGNOSIS — I270 Primary pulmonary hypertension: Secondary | ICD-10-CM | POA: Diagnosis not present

## 2018-07-19 DIAGNOSIS — J449 Chronic obstructive pulmonary disease, unspecified: Secondary | ICD-10-CM | POA: Diagnosis not present

## 2018-07-21 ENCOUNTER — Encounter: Payer: Self-pay | Admitting: Cardiovascular Disease

## 2018-07-21 DIAGNOSIS — N183 Chronic kidney disease, stage 3 (moderate): Secondary | ICD-10-CM | POA: Diagnosis not present

## 2018-07-21 DIAGNOSIS — I110 Hypertensive heart disease with heart failure: Secondary | ICD-10-CM | POA: Diagnosis not present

## 2018-07-21 DIAGNOSIS — I270 Primary pulmonary hypertension: Secondary | ICD-10-CM | POA: Diagnosis not present

## 2018-07-21 DIAGNOSIS — J449 Chronic obstructive pulmonary disease, unspecified: Secondary | ICD-10-CM | POA: Diagnosis not present

## 2018-07-21 DIAGNOSIS — J9620 Acute and chronic respiratory failure, unspecified whether with hypoxia or hypercapnia: Secondary | ICD-10-CM | POA: Diagnosis not present

## 2018-07-21 DIAGNOSIS — I5020 Unspecified systolic (congestive) heart failure: Secondary | ICD-10-CM | POA: Diagnosis not present

## 2018-07-21 NOTE — Telephone Encounter (Signed)
error 

## 2018-07-24 ENCOUNTER — Telehealth: Payer: Self-pay | Admitting: *Deleted

## 2018-07-24 NOTE — Telephone Encounter (Signed)
Received a call from Thomes Dinning with [facility] about patient Anthony Wickham Sr. Who is a patient of Dr Evette Georges. Thomes Dinning stated he needed cpap settings for the patients cpap machine. I reached out to [facility] to get assistance with the patients settings. Rhonda ([facility] home [facility]) was able to find the setting of 16 m H20 and I called Sam King of Hospice and gave him the setting.  Sam verbalized understanding.

## 2018-07-25 ENCOUNTER — Inpatient Hospital Stay (HOSPITAL_COMMUNITY): Payer: Self-pay

## 2018-07-25 DIAGNOSIS — Z961 Presence of intraocular lens: Secondary | ICD-10-CM | POA: Diagnosis not present

## 2018-07-26 DIAGNOSIS — I110 Hypertensive heart disease with heart failure: Secondary | ICD-10-CM | POA: Diagnosis not present

## 2018-07-26 DIAGNOSIS — I270 Primary pulmonary hypertension: Secondary | ICD-10-CM | POA: Diagnosis not present

## 2018-07-26 DIAGNOSIS — I5020 Unspecified systolic (congestive) heart failure: Secondary | ICD-10-CM | POA: Diagnosis not present

## 2018-07-26 DIAGNOSIS — N183 Chronic kidney disease, stage 3 (moderate): Secondary | ICD-10-CM | POA: Diagnosis not present

## 2018-07-26 DIAGNOSIS — J9620 Acute and chronic respiratory failure, unspecified whether with hypoxia or hypercapnia: Secondary | ICD-10-CM | POA: Diagnosis not present

## 2018-07-26 DIAGNOSIS — J449 Chronic obstructive pulmonary disease, unspecified: Secondary | ICD-10-CM | POA: Diagnosis not present

## 2018-07-27 ENCOUNTER — Other Ambulatory Visit: Payer: Self-pay | Admitting: Adult Health

## 2018-07-27 DIAGNOSIS — Z8719 Personal history of other diseases of the digestive system: Secondary | ICD-10-CM

## 2018-07-28 DIAGNOSIS — N183 Chronic kidney disease, stage 3 (moderate): Secondary | ICD-10-CM | POA: Diagnosis not present

## 2018-07-28 DIAGNOSIS — I5020 Unspecified systolic (congestive) heart failure: Secondary | ICD-10-CM | POA: Diagnosis not present

## 2018-07-28 DIAGNOSIS — J449 Chronic obstructive pulmonary disease, unspecified: Secondary | ICD-10-CM | POA: Diagnosis not present

## 2018-07-28 DIAGNOSIS — I270 Primary pulmonary hypertension: Secondary | ICD-10-CM | POA: Diagnosis not present

## 2018-07-28 DIAGNOSIS — J9620 Acute and chronic respiratory failure, unspecified whether with hypoxia or hypercapnia: Secondary | ICD-10-CM | POA: Diagnosis not present

## 2018-07-28 DIAGNOSIS — I110 Hypertensive heart disease with heart failure: Secondary | ICD-10-CM | POA: Diagnosis not present

## 2018-08-01 DIAGNOSIS — I270 Primary pulmonary hypertension: Secondary | ICD-10-CM | POA: Diagnosis not present

## 2018-08-01 DIAGNOSIS — J449 Chronic obstructive pulmonary disease, unspecified: Secondary | ICD-10-CM | POA: Diagnosis not present

## 2018-08-01 DIAGNOSIS — I5020 Unspecified systolic (congestive) heart failure: Secondary | ICD-10-CM | POA: Diagnosis not present

## 2018-08-01 DIAGNOSIS — I110 Hypertensive heart disease with heart failure: Secondary | ICD-10-CM | POA: Diagnosis not present

## 2018-08-01 DIAGNOSIS — N183 Chronic kidney disease, stage 3 (moderate): Secondary | ICD-10-CM | POA: Diagnosis not present

## 2018-08-01 DIAGNOSIS — J9620 Acute and chronic respiratory failure, unspecified whether with hypoxia or hypercapnia: Secondary | ICD-10-CM | POA: Diagnosis not present

## 2018-08-04 DIAGNOSIS — I110 Hypertensive heart disease with heart failure: Secondary | ICD-10-CM | POA: Diagnosis not present

## 2018-08-04 DIAGNOSIS — J449 Chronic obstructive pulmonary disease, unspecified: Secondary | ICD-10-CM | POA: Diagnosis not present

## 2018-08-04 DIAGNOSIS — I5020 Unspecified systolic (congestive) heart failure: Secondary | ICD-10-CM | POA: Diagnosis not present

## 2018-08-04 DIAGNOSIS — I270 Primary pulmonary hypertension: Secondary | ICD-10-CM | POA: Diagnosis not present

## 2018-08-04 DIAGNOSIS — J9620 Acute and chronic respiratory failure, unspecified whether with hypoxia or hypercapnia: Secondary | ICD-10-CM | POA: Diagnosis not present

## 2018-08-04 DIAGNOSIS — N183 Chronic kidney disease, stage 3 (moderate): Secondary | ICD-10-CM | POA: Diagnosis not present

## 2018-08-08 DIAGNOSIS — I110 Hypertensive heart disease with heart failure: Secondary | ICD-10-CM | POA: Diagnosis not present

## 2018-08-08 DIAGNOSIS — I270 Primary pulmonary hypertension: Secondary | ICD-10-CM | POA: Diagnosis not present

## 2018-08-08 DIAGNOSIS — I5020 Unspecified systolic (congestive) heart failure: Secondary | ICD-10-CM | POA: Diagnosis not present

## 2018-08-08 DIAGNOSIS — N183 Chronic kidney disease, stage 3 (moderate): Secondary | ICD-10-CM | POA: Diagnosis not present

## 2018-08-08 DIAGNOSIS — J9620 Acute and chronic respiratory failure, unspecified whether with hypoxia or hypercapnia: Secondary | ICD-10-CM | POA: Diagnosis not present

## 2018-08-08 DIAGNOSIS — J449 Chronic obstructive pulmonary disease, unspecified: Secondary | ICD-10-CM | POA: Diagnosis not present

## 2018-08-09 DIAGNOSIS — J9620 Acute and chronic respiratory failure, unspecified whether with hypoxia or hypercapnia: Secondary | ICD-10-CM | POA: Diagnosis not present

## 2018-08-09 DIAGNOSIS — D61818 Other pancytopenia: Secondary | ICD-10-CM | POA: Diagnosis not present

## 2018-08-09 DIAGNOSIS — D649 Anemia, unspecified: Secondary | ICD-10-CM | POA: Diagnosis not present

## 2018-08-09 DIAGNOSIS — K219 Gastro-esophageal reflux disease without esophagitis: Secondary | ICD-10-CM | POA: Diagnosis not present

## 2018-08-09 DIAGNOSIS — I4891 Unspecified atrial fibrillation: Secondary | ICD-10-CM | POA: Diagnosis not present

## 2018-08-09 DIAGNOSIS — I5020 Unspecified systolic (congestive) heart failure: Secondary | ICD-10-CM | POA: Diagnosis not present

## 2018-08-09 DIAGNOSIS — I270 Primary pulmonary hypertension: Secondary | ICD-10-CM | POA: Diagnosis not present

## 2018-08-09 DIAGNOSIS — N183 Chronic kidney disease, stage 3 (moderate): Secondary | ICD-10-CM | POA: Diagnosis not present

## 2018-08-09 DIAGNOSIS — I110 Hypertensive heart disease with heart failure: Secondary | ICD-10-CM | POA: Diagnosis not present

## 2018-08-09 DIAGNOSIS — J449 Chronic obstructive pulmonary disease, unspecified: Secondary | ICD-10-CM | POA: Diagnosis not present

## 2018-08-09 DIAGNOSIS — M109 Gout, unspecified: Secondary | ICD-10-CM | POA: Diagnosis not present

## 2018-08-09 DIAGNOSIS — G2581 Restless legs syndrome: Secondary | ICD-10-CM | POA: Diagnosis not present

## 2018-08-11 DIAGNOSIS — J9620 Acute and chronic respiratory failure, unspecified whether with hypoxia or hypercapnia: Secondary | ICD-10-CM | POA: Diagnosis not present

## 2018-08-11 DIAGNOSIS — I270 Primary pulmonary hypertension: Secondary | ICD-10-CM | POA: Diagnosis not present

## 2018-08-11 DIAGNOSIS — N183 Chronic kidney disease, stage 3 (moderate): Secondary | ICD-10-CM | POA: Diagnosis not present

## 2018-08-11 DIAGNOSIS — J449 Chronic obstructive pulmonary disease, unspecified: Secondary | ICD-10-CM | POA: Diagnosis not present

## 2018-08-11 DIAGNOSIS — I5020 Unspecified systolic (congestive) heart failure: Secondary | ICD-10-CM | POA: Diagnosis not present

## 2018-08-11 DIAGNOSIS — I110 Hypertensive heart disease with heart failure: Secondary | ICD-10-CM | POA: Diagnosis not present

## 2018-08-12 ENCOUNTER — Other Ambulatory Visit: Payer: Self-pay | Admitting: Adult Health

## 2018-08-12 DIAGNOSIS — N400 Enlarged prostate without lower urinary tract symptoms: Secondary | ICD-10-CM

## 2018-08-16 ENCOUNTER — Telehealth: Payer: Self-pay | Admitting: Cardiovascular Disease

## 2018-08-16 ENCOUNTER — Other Ambulatory Visit: Payer: Self-pay | Admitting: Adult Health

## 2018-08-16 DIAGNOSIS — J9620 Acute and chronic respiratory failure, unspecified whether with hypoxia or hypercapnia: Secondary | ICD-10-CM | POA: Diagnosis not present

## 2018-08-16 DIAGNOSIS — J449 Chronic obstructive pulmonary disease, unspecified: Secondary | ICD-10-CM | POA: Diagnosis not present

## 2018-08-16 DIAGNOSIS — I5020 Unspecified systolic (congestive) heart failure: Secondary | ICD-10-CM | POA: Diagnosis not present

## 2018-08-16 DIAGNOSIS — N183 Chronic kidney disease, stage 3 (moderate): Secondary | ICD-10-CM | POA: Diagnosis not present

## 2018-08-16 DIAGNOSIS — N400 Enlarged prostate without lower urinary tract symptoms: Secondary | ICD-10-CM

## 2018-08-16 DIAGNOSIS — I270 Primary pulmonary hypertension: Secondary | ICD-10-CM | POA: Diagnosis not present

## 2018-08-16 DIAGNOSIS — I110 Hypertensive heart disease with heart failure: Secondary | ICD-10-CM | POA: Diagnosis not present

## 2018-08-16 NOTE — Telephone Encounter (Signed)
Spoke with Sam from [facility] who states pt has had an 8.4 lb weight gain since 07/26/18. He reports pt has barely 1+ lower leg/ankle edema in both extremity. He reports pt has bilateral diminished lung sounds throughout but no SOB, and doesn't seem to be in any kind of distress. He states BP 130/60 HR 72 O2 stats 93% on 3 L of O2. Will route to MD for recommendations.

## 2018-08-16 NOTE — Telephone Encounter (Signed)
New message   Pt c/o swelling: STAT is pt has developed SOB within 24 hours  1) How much weight have you gained and in what time span? 8.4 lbs weight gain since 18th of December   2) If swelling, where is the swelling located? Lower legs and ankles   3) Are you currently taking a fluid pill? Yes   4) Are you currently SOB? No   5) Do you have a log of your daily weights (if so, list)? Yes, will provide if needed   6) Have you gained 3 pounds in a day or 5 pounds in a week? N/a   7) Have you traveled recently? No    Per Sam states that his lungs are dimished, HR 72 b/p 130/60 on 08/17/2018 and the patient is on hospice . Per Sam's request please call him and the patient to discuss.

## 2018-08-17 MED ORDER — SPIRONOLACTONE 25 MG PO TABS: 25.0000 mg | ORAL_TABLET | Freq: Every day | ORAL | 3 refills | Status: DC

## 2018-08-17 MED ORDER — TORSEMIDE 20 MG PO TABS: 80.0000 mg | ORAL_TABLET | Freq: Every day | ORAL | 3 refills | Status: DC

## 2018-08-17 NOTE — Telephone Encounter (Signed)
Please increase torsemide to 80 mg daily and spironolactone to 25 mg daily. Report BP and weight in a week. Thanks

## 2018-08-17 NOTE — Telephone Encounter (Signed)
Sam from Hospice called to request Rx be sent to [facility] in [place]. New Rx sent. Sam also confirmed daughter has received message regarding Dr. Lurline Del recommendation.

## 2018-08-17 NOTE — Telephone Encounter (Signed)
Sam from [facility] updated with Dr. Lurline Del recommendation. Verbalized understanding. Left message for daughter to contact office to inform of recommendations.

## 2018-08-23 DIAGNOSIS — J449 Chronic obstructive pulmonary disease, unspecified: Secondary | ICD-10-CM | POA: Diagnosis not present

## 2018-08-23 DIAGNOSIS — I110 Hypertensive heart disease with heart failure: Secondary | ICD-10-CM | POA: Diagnosis not present

## 2018-08-23 DIAGNOSIS — N183 Chronic kidney disease, stage 3 (moderate): Secondary | ICD-10-CM | POA: Diagnosis not present

## 2018-08-23 DIAGNOSIS — I270 Primary pulmonary hypertension: Secondary | ICD-10-CM | POA: Diagnosis not present

## 2018-08-23 DIAGNOSIS — J9620 Acute and chronic respiratory failure, unspecified whether with hypoxia or hypercapnia: Secondary | ICD-10-CM | POA: Diagnosis not present

## 2018-08-23 DIAGNOSIS — I5020 Unspecified systolic (congestive) heart failure: Secondary | ICD-10-CM | POA: Diagnosis not present

## 2018-08-24 ENCOUNTER — Other Ambulatory Visit: Payer: Self-pay | Admitting: Adult Health

## 2018-08-24 DIAGNOSIS — N400 Enlarged prostate without lower urinary tract symptoms: Secondary | ICD-10-CM

## 2018-08-25 DIAGNOSIS — J9620 Acute and chronic respiratory failure, unspecified whether with hypoxia or hypercapnia: Secondary | ICD-10-CM | POA: Diagnosis not present

## 2018-08-25 DIAGNOSIS — I110 Hypertensive heart disease with heart failure: Secondary | ICD-10-CM | POA: Diagnosis not present

## 2018-08-25 DIAGNOSIS — J449 Chronic obstructive pulmonary disease, unspecified: Secondary | ICD-10-CM | POA: Diagnosis not present

## 2018-08-25 DIAGNOSIS — I5020 Unspecified systolic (congestive) heart failure: Secondary | ICD-10-CM | POA: Diagnosis not present

## 2018-08-25 DIAGNOSIS — N183 Chronic kidney disease, stage 3 (moderate): Secondary | ICD-10-CM | POA: Diagnosis not present

## 2018-08-25 DIAGNOSIS — I270 Primary pulmonary hypertension: Secondary | ICD-10-CM | POA: Diagnosis not present

## 2018-08-29 ENCOUNTER — Other Ambulatory Visit: Payer: Self-pay | Admitting: Cardiovascular Disease

## 2018-08-30 DIAGNOSIS — N183 Chronic kidney disease, stage 3 (moderate): Secondary | ICD-10-CM | POA: Diagnosis not present

## 2018-08-30 DIAGNOSIS — I5020 Unspecified systolic (congestive) heart failure: Secondary | ICD-10-CM | POA: Diagnosis not present

## 2018-08-30 DIAGNOSIS — I110 Hypertensive heart disease with heart failure: Secondary | ICD-10-CM | POA: Diagnosis not present

## 2018-08-30 DIAGNOSIS — J9620 Acute and chronic respiratory failure, unspecified whether with hypoxia or hypercapnia: Secondary | ICD-10-CM | POA: Diagnosis not present

## 2018-08-30 DIAGNOSIS — I270 Primary pulmonary hypertension: Secondary | ICD-10-CM | POA: Diagnosis not present

## 2018-08-30 DIAGNOSIS — J449 Chronic obstructive pulmonary disease, unspecified: Secondary | ICD-10-CM | POA: Diagnosis not present

## 2018-08-31 ENCOUNTER — Other Ambulatory Visit: Payer: Self-pay | Admitting: Cardiovascular Disease

## 2018-09-01 ENCOUNTER — Other Ambulatory Visit: Payer: Self-pay | Admitting: Adult Health

## 2018-09-01 ENCOUNTER — Encounter: Payer: Self-pay | Admitting: Cardiovascular Disease

## 2018-09-01 DIAGNOSIS — N183 Chronic kidney disease, stage 3 (moderate): Secondary | ICD-10-CM | POA: Diagnosis not present

## 2018-09-01 DIAGNOSIS — I270 Primary pulmonary hypertension: Secondary | ICD-10-CM | POA: Diagnosis not present

## 2018-09-01 DIAGNOSIS — I5020 Unspecified systolic (congestive) heart failure: Secondary | ICD-10-CM | POA: Diagnosis not present

## 2018-09-01 DIAGNOSIS — J9620 Acute and chronic respiratory failure, unspecified whether with hypoxia or hypercapnia: Secondary | ICD-10-CM | POA: Diagnosis not present

## 2018-09-01 DIAGNOSIS — J449 Chronic obstructive pulmonary disease, unspecified: Secondary | ICD-10-CM | POA: Diagnosis not present

## 2018-09-01 DIAGNOSIS — N400 Enlarged prostate without lower urinary tract symptoms: Secondary | ICD-10-CM

## 2018-09-01 DIAGNOSIS — I110 Hypertensive heart disease with heart failure: Secondary | ICD-10-CM | POA: Diagnosis not present

## 2018-09-04 DIAGNOSIS — I5020 Unspecified systolic (congestive) heart failure: Secondary | ICD-10-CM | POA: Diagnosis not present

## 2018-09-04 DIAGNOSIS — I110 Hypertensive heart disease with heart failure: Secondary | ICD-10-CM | POA: Diagnosis not present

## 2018-09-04 DIAGNOSIS — J449 Chronic obstructive pulmonary disease, unspecified: Secondary | ICD-10-CM | POA: Diagnosis not present

## 2018-09-04 DIAGNOSIS — J9620 Acute and chronic respiratory failure, unspecified whether with hypoxia or hypercapnia: Secondary | ICD-10-CM | POA: Diagnosis not present

## 2018-09-04 DIAGNOSIS — I270 Primary pulmonary hypertension: Secondary | ICD-10-CM | POA: Diagnosis not present

## 2018-09-04 DIAGNOSIS — N183 Chronic kidney disease, stage 3 (moderate): Secondary | ICD-10-CM | POA: Diagnosis not present

## 2018-09-06 DIAGNOSIS — N183 Chronic kidney disease, stage 3 (moderate): Secondary | ICD-10-CM | POA: Diagnosis not present

## 2018-09-06 DIAGNOSIS — J449 Chronic obstructive pulmonary disease, unspecified: Secondary | ICD-10-CM | POA: Diagnosis not present

## 2018-09-06 DIAGNOSIS — I110 Hypertensive heart disease with heart failure: Secondary | ICD-10-CM | POA: Diagnosis not present

## 2018-09-06 DIAGNOSIS — I270 Primary pulmonary hypertension: Secondary | ICD-10-CM | POA: Diagnosis not present

## 2018-09-06 DIAGNOSIS — I5020 Unspecified systolic (congestive) heart failure: Secondary | ICD-10-CM | POA: Diagnosis not present

## 2018-09-06 DIAGNOSIS — J9620 Acute and chronic respiratory failure, unspecified whether with hypoxia or hypercapnia: Secondary | ICD-10-CM | POA: Diagnosis not present

## 2018-09-07 ENCOUNTER — Telehealth: Payer: Self-pay | Admitting: Cardiovascular Disease

## 2018-09-07 DIAGNOSIS — J449 Chronic obstructive pulmonary disease, unspecified: Secondary | ICD-10-CM | POA: Diagnosis not present

## 2018-09-07 DIAGNOSIS — I110 Hypertensive heart disease with heart failure: Secondary | ICD-10-CM | POA: Diagnosis not present

## 2018-09-07 DIAGNOSIS — I270 Primary pulmonary hypertension: Secondary | ICD-10-CM | POA: Diagnosis not present

## 2018-09-07 DIAGNOSIS — N183 Chronic kidney disease, stage 3 (moderate): Secondary | ICD-10-CM | POA: Diagnosis not present

## 2018-09-07 DIAGNOSIS — J9620 Acute and chronic respiratory failure, unspecified whether with hypoxia or hypercapnia: Secondary | ICD-10-CM | POA: Diagnosis not present

## 2018-09-07 DIAGNOSIS — I5020 Unspecified systolic (congestive) heart failure: Secondary | ICD-10-CM | POA: Diagnosis not present

## 2018-09-07 NOTE — Telephone Encounter (Signed)
Spoke with sam, aware of dr croitoru's orders.

## 2018-09-07 NOTE — Telephone Encounter (Signed)
I would continue current meds. Please call us if dyspnea worsens or if weight increases > 189 lb. Thanks [employer]

## 2018-09-07 NOTE — Telephone Encounter (Signed)
New Message    Giving update on patients BP and Weight since the med changes.    BP-114/60 HR-56 Respiration-18 Weight- 186lbs Has only a trace of edema in ankles, and lung sounds are diminished.  No SOB than normal.

## 2018-09-07 NOTE — Telephone Encounter (Signed)
Will forward to dr croitoru

## 2018-09-08 DIAGNOSIS — J449 Chronic obstructive pulmonary disease, unspecified: Secondary | ICD-10-CM | POA: Diagnosis not present

## 2018-09-08 DIAGNOSIS — J9620 Acute and chronic respiratory failure, unspecified whether with hypoxia or hypercapnia: Secondary | ICD-10-CM | POA: Diagnosis not present

## 2018-09-08 DIAGNOSIS — N183 Chronic kidney disease, stage 3 (moderate): Secondary | ICD-10-CM | POA: Diagnosis not present

## 2018-09-08 DIAGNOSIS — I110 Hypertensive heart disease with heart failure: Secondary | ICD-10-CM | POA: Diagnosis not present

## 2018-09-08 DIAGNOSIS — I270 Primary pulmonary hypertension: Secondary | ICD-10-CM | POA: Diagnosis not present

## 2018-09-08 DIAGNOSIS — I5020 Unspecified systolic (congestive) heart failure: Secondary | ICD-10-CM | POA: Diagnosis not present

## 2018-09-09 DIAGNOSIS — J449 Chronic obstructive pulmonary disease, unspecified: Secondary | ICD-10-CM | POA: Diagnosis not present

## 2018-09-09 DIAGNOSIS — K219 Gastro-esophageal reflux disease without esophagitis: Secondary | ICD-10-CM | POA: Diagnosis not present

## 2018-09-09 DIAGNOSIS — N183 Chronic kidney disease, stage 3 (moderate): Secondary | ICD-10-CM | POA: Diagnosis not present

## 2018-09-09 DIAGNOSIS — J9620 Acute and chronic respiratory failure, unspecified whether with hypoxia or hypercapnia: Secondary | ICD-10-CM | POA: Diagnosis not present

## 2018-09-09 DIAGNOSIS — D649 Anemia, unspecified: Secondary | ICD-10-CM | POA: Diagnosis not present

## 2018-09-09 DIAGNOSIS — I110 Hypertensive heart disease with heart failure: Secondary | ICD-10-CM | POA: Diagnosis not present

## 2018-09-09 DIAGNOSIS — G2581 Restless legs syndrome: Secondary | ICD-10-CM | POA: Diagnosis not present

## 2018-09-09 DIAGNOSIS — I4891 Unspecified atrial fibrillation: Secondary | ICD-10-CM | POA: Diagnosis not present

## 2018-09-09 DIAGNOSIS — M109 Gout, unspecified: Secondary | ICD-10-CM | POA: Diagnosis not present

## 2018-09-09 DIAGNOSIS — D61818 Other pancytopenia: Secondary | ICD-10-CM | POA: Diagnosis not present

## 2018-09-09 DIAGNOSIS — I5020 Unspecified systolic (congestive) heart failure: Secondary | ICD-10-CM | POA: Diagnosis not present

## 2018-09-09 DIAGNOSIS — I270 Primary pulmonary hypertension: Secondary | ICD-10-CM | POA: Diagnosis not present

## 2018-09-11 DIAGNOSIS — J449 Chronic obstructive pulmonary disease, unspecified: Secondary | ICD-10-CM | POA: Diagnosis not present

## 2018-09-11 DIAGNOSIS — I270 Primary pulmonary hypertension: Secondary | ICD-10-CM | POA: Diagnosis not present

## 2018-09-11 DIAGNOSIS — N183 Chronic kidney disease, stage 3 (moderate): Secondary | ICD-10-CM | POA: Diagnosis not present

## 2018-09-11 DIAGNOSIS — J9620 Acute and chronic respiratory failure, unspecified whether with hypoxia or hypercapnia: Secondary | ICD-10-CM | POA: Diagnosis not present

## 2018-09-11 DIAGNOSIS — I110 Hypertensive heart disease with heart failure: Secondary | ICD-10-CM | POA: Diagnosis not present

## 2018-09-11 DIAGNOSIS — I5020 Unspecified systolic (congestive) heart failure: Secondary | ICD-10-CM | POA: Diagnosis not present

## 2018-09-13 DIAGNOSIS — I110 Hypertensive heart disease with heart failure: Secondary | ICD-10-CM | POA: Diagnosis not present

## 2018-09-13 DIAGNOSIS — J9620 Acute and chronic respiratory failure, unspecified whether with hypoxia or hypercapnia: Secondary | ICD-10-CM | POA: Diagnosis not present

## 2018-09-13 DIAGNOSIS — J449 Chronic obstructive pulmonary disease, unspecified: Secondary | ICD-10-CM | POA: Diagnosis not present

## 2018-09-13 DIAGNOSIS — I270 Primary pulmonary hypertension: Secondary | ICD-10-CM | POA: Diagnosis not present

## 2018-09-13 DIAGNOSIS — I5020 Unspecified systolic (congestive) heart failure: Secondary | ICD-10-CM | POA: Diagnosis not present

## 2018-09-13 DIAGNOSIS — N183 Chronic kidney disease, stage 3 (moderate): Secondary | ICD-10-CM | POA: Diagnosis not present

## 2018-09-20 DIAGNOSIS — I110 Hypertensive heart disease with heart failure: Secondary | ICD-10-CM | POA: Diagnosis not present

## 2018-09-20 DIAGNOSIS — I270 Primary pulmonary hypertension: Secondary | ICD-10-CM | POA: Diagnosis not present

## 2018-09-20 DIAGNOSIS — N183 Chronic kidney disease, stage 3 (moderate): Secondary | ICD-10-CM | POA: Diagnosis not present

## 2018-09-20 DIAGNOSIS — I5020 Unspecified systolic (congestive) heart failure: Secondary | ICD-10-CM | POA: Diagnosis not present

## 2018-09-20 DIAGNOSIS — J449 Chronic obstructive pulmonary disease, unspecified: Secondary | ICD-10-CM | POA: Diagnosis not present

## 2018-09-20 DIAGNOSIS — J9620 Acute and chronic respiratory failure, unspecified whether with hypoxia or hypercapnia: Secondary | ICD-10-CM | POA: Diagnosis not present

## 2018-09-26 DIAGNOSIS — J449 Chronic obstructive pulmonary disease, unspecified: Secondary | ICD-10-CM | POA: Diagnosis not present

## 2018-09-26 DIAGNOSIS — J9620 Acute and chronic respiratory failure, unspecified whether with hypoxia or hypercapnia: Secondary | ICD-10-CM | POA: Diagnosis not present

## 2018-09-26 DIAGNOSIS — I5020 Unspecified systolic (congestive) heart failure: Secondary | ICD-10-CM | POA: Diagnosis not present

## 2018-09-26 DIAGNOSIS — N183 Chronic kidney disease, stage 3 (moderate): Secondary | ICD-10-CM | POA: Diagnosis not present

## 2018-09-26 DIAGNOSIS — I110 Hypertensive heart disease with heart failure: Secondary | ICD-10-CM | POA: Diagnosis not present

## 2018-09-26 DIAGNOSIS — I270 Primary pulmonary hypertension: Secondary | ICD-10-CM | POA: Diagnosis not present

## 2018-09-27 DIAGNOSIS — N183 Chronic kidney disease, stage 3 (moderate): Secondary | ICD-10-CM | POA: Diagnosis not present

## 2018-09-27 DIAGNOSIS — J449 Chronic obstructive pulmonary disease, unspecified: Secondary | ICD-10-CM | POA: Diagnosis not present

## 2018-09-27 DIAGNOSIS — J9620 Acute and chronic respiratory failure, unspecified whether with hypoxia or hypercapnia: Secondary | ICD-10-CM | POA: Diagnosis not present

## 2018-09-27 DIAGNOSIS — I270 Primary pulmonary hypertension: Secondary | ICD-10-CM | POA: Diagnosis not present

## 2018-09-27 DIAGNOSIS — I110 Hypertensive heart disease with heart failure: Secondary | ICD-10-CM | POA: Diagnosis not present

## 2018-09-27 DIAGNOSIS — I5020 Unspecified systolic (congestive) heart failure: Secondary | ICD-10-CM | POA: Diagnosis not present

## 2018-09-28 DIAGNOSIS — J9620 Acute and chronic respiratory failure, unspecified whether with hypoxia or hypercapnia: Secondary | ICD-10-CM | POA: Diagnosis not present

## 2018-09-28 DIAGNOSIS — I5020 Unspecified systolic (congestive) heart failure: Secondary | ICD-10-CM | POA: Diagnosis not present

## 2018-09-28 DIAGNOSIS — N183 Chronic kidney disease, stage 3 (moderate): Secondary | ICD-10-CM | POA: Diagnosis not present

## 2018-09-28 DIAGNOSIS — J449 Chronic obstructive pulmonary disease, unspecified: Secondary | ICD-10-CM | POA: Diagnosis not present

## 2018-09-28 DIAGNOSIS — I110 Hypertensive heart disease with heart failure: Secondary | ICD-10-CM | POA: Diagnosis not present

## 2018-09-28 DIAGNOSIS — I270 Primary pulmonary hypertension: Secondary | ICD-10-CM | POA: Diagnosis not present

## 2018-10-04 DIAGNOSIS — J9620 Acute and chronic respiratory failure, unspecified whether with hypoxia or hypercapnia: Secondary | ICD-10-CM | POA: Diagnosis not present

## 2018-10-04 DIAGNOSIS — I270 Primary pulmonary hypertension: Secondary | ICD-10-CM | POA: Diagnosis not present

## 2018-10-04 DIAGNOSIS — N183 Chronic kidney disease, stage 3 (moderate): Secondary | ICD-10-CM | POA: Diagnosis not present

## 2018-10-04 DIAGNOSIS — I5020 Unspecified systolic (congestive) heart failure: Secondary | ICD-10-CM | POA: Diagnosis not present

## 2018-10-04 DIAGNOSIS — J449 Chronic obstructive pulmonary disease, unspecified: Secondary | ICD-10-CM | POA: Diagnosis not present

## 2018-10-04 DIAGNOSIS — I110 Hypertensive heart disease with heart failure: Secondary | ICD-10-CM | POA: Diagnosis not present

## 2018-10-06 ENCOUNTER — Other Ambulatory Visit: Payer: Self-pay | Admitting: Gastroenterology

## 2018-10-06 DIAGNOSIS — N183 Chronic kidney disease, stage 3 (moderate): Secondary | ICD-10-CM | POA: Diagnosis not present

## 2018-10-06 DIAGNOSIS — J449 Chronic obstructive pulmonary disease, unspecified: Secondary | ICD-10-CM | POA: Diagnosis not present

## 2018-10-06 DIAGNOSIS — I5020 Unspecified systolic (congestive) heart failure: Secondary | ICD-10-CM | POA: Diagnosis not present

## 2018-10-06 DIAGNOSIS — J9620 Acute and chronic respiratory failure, unspecified whether with hypoxia or hypercapnia: Secondary | ICD-10-CM | POA: Diagnosis not present

## 2018-10-06 DIAGNOSIS — R1310 Dysphagia, unspecified: Secondary | ICD-10-CM

## 2018-10-06 DIAGNOSIS — R634 Abnormal weight loss: Secondary | ICD-10-CM | POA: Diagnosis not present

## 2018-10-06 DIAGNOSIS — I110 Hypertensive heart disease with heart failure: Secondary | ICD-10-CM | POA: Diagnosis not present

## 2018-10-06 DIAGNOSIS — I270 Primary pulmonary hypertension: Secondary | ICD-10-CM | POA: Diagnosis not present

## 2018-10-08 DIAGNOSIS — I5020 Unspecified systolic (congestive) heart failure: Secondary | ICD-10-CM | POA: Diagnosis not present

## 2018-10-08 DIAGNOSIS — J9620 Acute and chronic respiratory failure, unspecified whether with hypoxia or hypercapnia: Secondary | ICD-10-CM | POA: Diagnosis not present

## 2018-10-08 DIAGNOSIS — M109 Gout, unspecified: Secondary | ICD-10-CM | POA: Diagnosis not present

## 2018-10-08 DIAGNOSIS — I270 Primary pulmonary hypertension: Secondary | ICD-10-CM | POA: Diagnosis not present

## 2018-10-08 DIAGNOSIS — I110 Hypertensive heart disease with heart failure: Secondary | ICD-10-CM | POA: Diagnosis not present

## 2018-10-08 DIAGNOSIS — D61818 Other pancytopenia: Secondary | ICD-10-CM | POA: Diagnosis not present

## 2018-10-08 DIAGNOSIS — N183 Chronic kidney disease, stage 3 (moderate): Secondary | ICD-10-CM | POA: Diagnosis not present

## 2018-10-08 DIAGNOSIS — D649 Anemia, unspecified: Secondary | ICD-10-CM | POA: Diagnosis not present

## 2018-10-08 DIAGNOSIS — J449 Chronic obstructive pulmonary disease, unspecified: Secondary | ICD-10-CM | POA: Diagnosis not present

## 2018-10-08 DIAGNOSIS — I4891 Unspecified atrial fibrillation: Secondary | ICD-10-CM | POA: Diagnosis not present

## 2018-10-09 ENCOUNTER — Ambulatory Visit
Admission: RE | Admit: 2018-10-09 | Discharge: 2018-10-09 | Disposition: A | Discharge status: Home / Self Care | Payer: PRIVATE HEALTH INSURANCE | Source: Ambulatory Visit | Attending: Gastroenterology | Admitting: Gastroenterology

## 2018-10-09 DIAGNOSIS — R1310 Dysphagia, unspecified: Secondary | ICD-10-CM | POA: Diagnosis not present

## 2018-10-10 DIAGNOSIS — I110 Hypertensive heart disease with heart failure: Secondary | ICD-10-CM | POA: Diagnosis not present

## 2018-10-10 DIAGNOSIS — N183 Chronic kidney disease, stage 3 (moderate): Secondary | ICD-10-CM | POA: Diagnosis not present

## 2018-10-10 DIAGNOSIS — I270 Primary pulmonary hypertension: Secondary | ICD-10-CM | POA: Diagnosis not present

## 2018-10-10 DIAGNOSIS — J449 Chronic obstructive pulmonary disease, unspecified: Secondary | ICD-10-CM | POA: Diagnosis not present

## 2018-10-10 DIAGNOSIS — J9620 Acute and chronic respiratory failure, unspecified whether with hypoxia or hypercapnia: Secondary | ICD-10-CM | POA: Diagnosis not present

## 2018-10-10 DIAGNOSIS — I5020 Unspecified systolic (congestive) heart failure: Secondary | ICD-10-CM | POA: Diagnosis not present

## 2018-10-11 ENCOUNTER — Ambulatory Visit (INDEPENDENT_AMBULATORY_CARE_PROVIDER_SITE_OTHER): Payer: Medicare Other | Admitting: *Deleted

## 2018-10-11 DIAGNOSIS — N183 Chronic kidney disease, stage 3 (moderate): Secondary | ICD-10-CM | POA: Diagnosis not present

## 2018-10-11 DIAGNOSIS — R001 Bradycardia, unspecified: Secondary | ICD-10-CM

## 2018-10-11 DIAGNOSIS — J449 Chronic obstructive pulmonary disease, unspecified: Secondary | ICD-10-CM | POA: Diagnosis not present

## 2018-10-11 DIAGNOSIS — I110 Hypertensive heart disease with heart failure: Secondary | ICD-10-CM | POA: Diagnosis not present

## 2018-10-11 DIAGNOSIS — I5020 Unspecified systolic (congestive) heart failure: Secondary | ICD-10-CM | POA: Diagnosis not present

## 2018-10-11 DIAGNOSIS — I270 Primary pulmonary hypertension: Secondary | ICD-10-CM | POA: Diagnosis not present

## 2018-10-11 DIAGNOSIS — I5022 Chronic systolic (congestive) heart failure: Secondary | ICD-10-CM

## 2018-10-11 DIAGNOSIS — J9620 Acute and chronic respiratory failure, unspecified whether with hypoxia or hypercapnia: Secondary | ICD-10-CM | POA: Diagnosis not present

## 2018-10-11 LAB — CUP PACEART REMOTE DEVICE CHECK
Battery Remaining Longevity: 40 mo
Battery Remaining Percentage: 91 %
Battery Voltage: 2.96 V
Brady Statistic AP VP Percent: 99 %
Brady Statistic AP VS Percent: 0 %
Brady Statistic AS VP Percent: 1 %
Brady Statistic AS VS Percent: 1 %
Brady Statistic RA Percent Paced: 99 %
Brady Statistic RV Percent Paced: 99 %
Date Time Interrogation Session: 20200303202814
Implantable Lead Implant Date: 20130305
Implantable Lead Implant Date: 20190821
Implantable Lead Location: 753858
Implantable Lead Location: 753860
Implantable Lead Model: 4396
Implantable Lead Serial Number: 29020819
Implantable Pulse Generator Implant Date: 20190821
Lead Channel Impedance Value: 450 Ohm
Lead Channel Impedance Value: 590 Ohm
Lead Channel Pacing Threshold Amplitude: 1 V
Lead Channel Pacing Threshold Amplitude: 2.5 V
Lead Channel Pacing Threshold Pulse Width: 0.5 ms
Lead Channel Pacing Threshold Pulse Width: 1 ms
Lead Channel Sensing Intrinsic Amplitude: 5 mV
Lead Channel Sensing Intrinsic Amplitude: 9 mV
Lead Channel Setting Pacing Amplitude: 4 V
Lead Channel Setting Pacing Pulse Width: 0.5 ms
Lead Channel Setting Sensing Sensitivity: 5 mV
MDC IDC PG SERIAL: 9051195
MDC IDC SET LEADCHNL RV PACING AMPLITUDE: 2.5 V
Pulse Gen Model: 2272

## 2018-10-12 DIAGNOSIS — N183 Chronic kidney disease, stage 3 (moderate): Secondary | ICD-10-CM | POA: Diagnosis not present

## 2018-10-12 DIAGNOSIS — I110 Hypertensive heart disease with heart failure: Secondary | ICD-10-CM | POA: Diagnosis not present

## 2018-10-12 DIAGNOSIS — J449 Chronic obstructive pulmonary disease, unspecified: Secondary | ICD-10-CM | POA: Diagnosis not present

## 2018-10-12 DIAGNOSIS — J9620 Acute and chronic respiratory failure, unspecified whether with hypoxia or hypercapnia: Secondary | ICD-10-CM | POA: Diagnosis not present

## 2018-10-12 DIAGNOSIS — I270 Primary pulmonary hypertension: Secondary | ICD-10-CM | POA: Diagnosis not present

## 2018-10-12 DIAGNOSIS — I5020 Unspecified systolic (congestive) heart failure: Secondary | ICD-10-CM | POA: Diagnosis not present

## 2018-10-13 DIAGNOSIS — I480 Paroxysmal atrial fibrillation: Secondary | ICD-10-CM | POA: Diagnosis not present

## 2018-10-13 DIAGNOSIS — I110 Hypertensive heart disease with heart failure: Secondary | ICD-10-CM | POA: Diagnosis not present

## 2018-10-13 DIAGNOSIS — N183 Chronic kidney disease, stage 3 (moderate): Secondary | ICD-10-CM | POA: Diagnosis not present

## 2018-10-13 DIAGNOSIS — I270 Primary pulmonary hypertension: Secondary | ICD-10-CM | POA: Diagnosis not present

## 2018-10-13 DIAGNOSIS — R7301 Impaired fasting glucose: Secondary | ICD-10-CM | POA: Diagnosis not present

## 2018-10-13 DIAGNOSIS — I10 Essential (primary) hypertension: Secondary | ICD-10-CM | POA: Diagnosis not present

## 2018-10-13 DIAGNOSIS — I5020 Unspecified systolic (congestive) heart failure: Secondary | ICD-10-CM | POA: Diagnosis not present

## 2018-10-13 DIAGNOSIS — Z125 Encounter for screening for malignant neoplasm of prostate: Secondary | ICD-10-CM | POA: Diagnosis not present

## 2018-10-13 DIAGNOSIS — M109 Gout, unspecified: Secondary | ICD-10-CM | POA: Diagnosis not present

## 2018-10-13 DIAGNOSIS — J9620 Acute and chronic respiratory failure, unspecified whether with hypoxia or hypercapnia: Secondary | ICD-10-CM | POA: Diagnosis not present

## 2018-10-13 DIAGNOSIS — J449 Chronic obstructive pulmonary disease, unspecified: Secondary | ICD-10-CM | POA: Diagnosis not present

## 2018-10-13 DIAGNOSIS — E7800 Pure hypercholesterolemia, unspecified: Secondary | ICD-10-CM | POA: Diagnosis not present

## 2018-10-16 DIAGNOSIS — N183 Chronic kidney disease, stage 3 (moderate): Secondary | ICD-10-CM | POA: Diagnosis not present

## 2018-10-16 DIAGNOSIS — I110 Hypertensive heart disease with heart failure: Secondary | ICD-10-CM | POA: Diagnosis not present

## 2018-10-16 DIAGNOSIS — I5020 Unspecified systolic (congestive) heart failure: Secondary | ICD-10-CM | POA: Diagnosis not present

## 2018-10-16 DIAGNOSIS — J449 Chronic obstructive pulmonary disease, unspecified: Secondary | ICD-10-CM | POA: Diagnosis not present

## 2018-10-16 DIAGNOSIS — J9620 Acute and chronic respiratory failure, unspecified whether with hypoxia or hypercapnia: Secondary | ICD-10-CM | POA: Diagnosis not present

## 2018-10-16 DIAGNOSIS — I270 Primary pulmonary hypertension: Secondary | ICD-10-CM | POA: Diagnosis not present

## 2018-10-17 DIAGNOSIS — R82998 Other abnormal findings in urine: Secondary | ICD-10-CM | POA: Diagnosis not present

## 2018-10-17 DIAGNOSIS — N183 Chronic kidney disease, stage 3 (moderate): Secondary | ICD-10-CM | POA: Diagnosis not present

## 2018-10-18 DIAGNOSIS — I5020 Unspecified systolic (congestive) heart failure: Secondary | ICD-10-CM | POA: Diagnosis not present

## 2018-10-18 DIAGNOSIS — J449 Chronic obstructive pulmonary disease, unspecified: Secondary | ICD-10-CM | POA: Diagnosis not present

## 2018-10-18 DIAGNOSIS — I270 Primary pulmonary hypertension: Secondary | ICD-10-CM | POA: Diagnosis not present

## 2018-10-18 DIAGNOSIS — N183 Chronic kidney disease, stage 3 (moderate): Secondary | ICD-10-CM | POA: Diagnosis not present

## 2018-10-18 DIAGNOSIS — I110 Hypertensive heart disease with heart failure: Secondary | ICD-10-CM | POA: Diagnosis not present

## 2018-10-18 DIAGNOSIS — J9620 Acute and chronic respiratory failure, unspecified whether with hypoxia or hypercapnia: Secondary | ICD-10-CM | POA: Diagnosis not present

## 2018-10-19 DIAGNOSIS — I5020 Unspecified systolic (congestive) heart failure: Secondary | ICD-10-CM | POA: Diagnosis not present

## 2018-10-19 DIAGNOSIS — I110 Hypertensive heart disease with heart failure: Secondary | ICD-10-CM | POA: Diagnosis not present

## 2018-10-19 DIAGNOSIS — I270 Primary pulmonary hypertension: Secondary | ICD-10-CM | POA: Diagnosis not present

## 2018-10-19 DIAGNOSIS — N183 Chronic kidney disease, stage 3 (moderate): Secondary | ICD-10-CM | POA: Diagnosis not present

## 2018-10-19 DIAGNOSIS — J449 Chronic obstructive pulmonary disease, unspecified: Secondary | ICD-10-CM | POA: Diagnosis not present

## 2018-10-19 DIAGNOSIS — J9620 Acute and chronic respiratory failure, unspecified whether with hypoxia or hypercapnia: Secondary | ICD-10-CM | POA: Diagnosis not present

## 2018-10-19 NOTE — Progress Notes (Signed)
Remote pacemaker transmission.   

## 2018-10-20 DIAGNOSIS — I270 Primary pulmonary hypertension: Secondary | ICD-10-CM | POA: Diagnosis not present

## 2018-10-20 DIAGNOSIS — J449 Chronic obstructive pulmonary disease, unspecified: Secondary | ICD-10-CM | POA: Diagnosis not present

## 2018-10-20 DIAGNOSIS — I5020 Unspecified systolic (congestive) heart failure: Secondary | ICD-10-CM | POA: Diagnosis not present

## 2018-10-20 DIAGNOSIS — N183 Chronic kidney disease, stage 3 (moderate): Secondary | ICD-10-CM | POA: Diagnosis not present

## 2018-10-20 DIAGNOSIS — J9620 Acute and chronic respiratory failure, unspecified whether with hypoxia or hypercapnia: Secondary | ICD-10-CM | POA: Diagnosis not present

## 2018-10-20 DIAGNOSIS — I110 Hypertensive heart disease with heart failure: Secondary | ICD-10-CM | POA: Diagnosis not present

## 2018-10-23 DIAGNOSIS — I509 Heart failure, unspecified: Secondary | ICD-10-CM | POA: Diagnosis not present

## 2018-10-23 DIAGNOSIS — I270 Primary pulmonary hypertension: Secondary | ICD-10-CM | POA: Diagnosis not present

## 2018-10-23 DIAGNOSIS — Z1331 Encounter for screening for depression: Secondary | ICD-10-CM | POA: Diagnosis not present

## 2018-10-23 DIAGNOSIS — Z0000 Encounter for general adult medical examination without abnormal findings: Secondary | ICD-10-CM | POA: Diagnosis not present

## 2018-10-23 DIAGNOSIS — D508 Other iron deficiency anemias: Secondary | ICD-10-CM | POA: Diagnosis not present

## 2018-10-23 DIAGNOSIS — I208 Other forms of angina pectoris: Secondary | ICD-10-CM | POA: Diagnosis not present

## 2018-10-23 DIAGNOSIS — E7800 Pure hypercholesterolemia, unspecified: Secondary | ICD-10-CM | POA: Diagnosis not present

## 2018-10-23 DIAGNOSIS — G2581 Restless legs syndrome: Secondary | ICD-10-CM | POA: Diagnosis not present

## 2018-10-23 DIAGNOSIS — I480 Paroxysmal atrial fibrillation: Secondary | ICD-10-CM | POA: Diagnosis not present

## 2018-10-23 DIAGNOSIS — D469 Myelodysplastic syndrome, unspecified: Secondary | ICD-10-CM | POA: Diagnosis not present

## 2018-10-23 DIAGNOSIS — I110 Hypertensive heart disease with heart failure: Secondary | ICD-10-CM | POA: Diagnosis not present

## 2018-10-23 DIAGNOSIS — J449 Chronic obstructive pulmonary disease, unspecified: Secondary | ICD-10-CM | POA: Diagnosis not present

## 2018-10-23 DIAGNOSIS — J9620 Acute and chronic respiratory failure, unspecified whether with hypoxia or hypercapnia: Secondary | ICD-10-CM | POA: Diagnosis not present

## 2018-10-23 DIAGNOSIS — Z9981 Dependence on supplemental oxygen: Secondary | ICD-10-CM | POA: Diagnosis not present

## 2018-10-23 DIAGNOSIS — I5020 Unspecified systolic (congestive) heart failure: Secondary | ICD-10-CM | POA: Diagnosis not present

## 2018-10-23 DIAGNOSIS — N183 Chronic kidney disease, stage 3 (moderate): Secondary | ICD-10-CM | POA: Diagnosis not present

## 2018-10-23 DIAGNOSIS — I2721 Secondary pulmonary arterial hypertension: Secondary | ICD-10-CM | POA: Diagnosis not present

## 2018-10-24 DIAGNOSIS — Z1212 Encounter for screening for malignant neoplasm of rectum: Secondary | ICD-10-CM | POA: Diagnosis not present

## 2018-10-24 DIAGNOSIS — I270 Primary pulmonary hypertension: Secondary | ICD-10-CM | POA: Diagnosis not present

## 2018-10-24 DIAGNOSIS — J449 Chronic obstructive pulmonary disease, unspecified: Secondary | ICD-10-CM | POA: Diagnosis not present

## 2018-10-24 DIAGNOSIS — I5020 Unspecified systolic (congestive) heart failure: Secondary | ICD-10-CM | POA: Diagnosis not present

## 2018-10-24 DIAGNOSIS — N183 Chronic kidney disease, stage 3 (moderate): Secondary | ICD-10-CM | POA: Diagnosis not present

## 2018-10-24 DIAGNOSIS — J9620 Acute and chronic respiratory failure, unspecified whether with hypoxia or hypercapnia: Secondary | ICD-10-CM | POA: Diagnosis not present

## 2018-10-24 DIAGNOSIS — I110 Hypertensive heart disease with heart failure: Secondary | ICD-10-CM | POA: Diagnosis not present

## 2018-11-08 DIAGNOSIS — J449 Chronic obstructive pulmonary disease, unspecified: Secondary | ICD-10-CM | POA: Diagnosis not present

## 2018-11-08 DIAGNOSIS — D649 Anemia, unspecified: Secondary | ICD-10-CM | POA: Diagnosis not present

## 2018-11-08 DIAGNOSIS — J9620 Acute and chronic respiratory failure, unspecified whether with hypoxia or hypercapnia: Secondary | ICD-10-CM | POA: Diagnosis not present

## 2018-11-08 DIAGNOSIS — N183 Chronic kidney disease, stage 3 (moderate): Secondary | ICD-10-CM | POA: Diagnosis not present

## 2018-11-08 DIAGNOSIS — I4891 Unspecified atrial fibrillation: Secondary | ICD-10-CM | POA: Diagnosis not present

## 2018-11-08 DIAGNOSIS — D61818 Other pancytopenia: Secondary | ICD-10-CM | POA: Diagnosis not present

## 2018-11-08 DIAGNOSIS — I270 Primary pulmonary hypertension: Secondary | ICD-10-CM | POA: Diagnosis not present

## 2018-11-08 DIAGNOSIS — I110 Hypertensive heart disease with heart failure: Secondary | ICD-10-CM | POA: Diagnosis not present

## 2018-11-08 DIAGNOSIS — I5020 Unspecified systolic (congestive) heart failure: Secondary | ICD-10-CM | POA: Diagnosis not present

## 2018-11-08 DIAGNOSIS — M109 Gout, unspecified: Secondary | ICD-10-CM | POA: Diagnosis not present

## 2018-11-16 DIAGNOSIS — I110 Hypertensive heart disease with heart failure: Secondary | ICD-10-CM | POA: Diagnosis not present

## 2018-11-16 DIAGNOSIS — N183 Chronic kidney disease, stage 3 (moderate): Secondary | ICD-10-CM | POA: Diagnosis not present

## 2018-11-16 DIAGNOSIS — J9620 Acute and chronic respiratory failure, unspecified whether with hypoxia or hypercapnia: Secondary | ICD-10-CM | POA: Diagnosis not present

## 2018-11-16 DIAGNOSIS — I5020 Unspecified systolic (congestive) heart failure: Secondary | ICD-10-CM | POA: Diagnosis not present

## 2018-11-16 DIAGNOSIS — J449 Chronic obstructive pulmonary disease, unspecified: Secondary | ICD-10-CM | POA: Diagnosis not present

## 2018-11-16 DIAGNOSIS — I270 Primary pulmonary hypertension: Secondary | ICD-10-CM | POA: Diagnosis not present

## 2018-12-04 ENCOUNTER — Encounter: Payer: Self-pay | Admitting: Cardiovascular Disease

## 2018-12-04 NOTE — Progress Notes (Unsigned)
[facility]lin alert for HVR episode, NSVT noted (LV lead in RA port). Pt on hospice per notes. Routed to Dr C as Dola Factor, NP 12/04/2018 10:29 AM

## 2018-12-04 NOTE — Progress Notes (Signed)
Thanks [facility] 

## 2018-12-05 ENCOUNTER — Telehealth: Payer: Self-pay | Admitting: Cardiovascular Disease

## 2018-12-05 NOTE — Telephone Encounter (Signed)
LVM to call and let us know if he wants a video, phone or office visit.

## 2018-12-06 ENCOUNTER — Encounter: Payer: Medicare Other | Admitting: Cardiovascular Disease

## 2018-12-08 DIAGNOSIS — I110 Hypertensive heart disease with heart failure: Secondary | ICD-10-CM | POA: Diagnosis not present

## 2018-12-08 DIAGNOSIS — N183 Chronic kidney disease, stage 3 (moderate): Secondary | ICD-10-CM | POA: Diagnosis not present

## 2018-12-08 DIAGNOSIS — D649 Anemia, unspecified: Secondary | ICD-10-CM | POA: Diagnosis not present

## 2018-12-08 DIAGNOSIS — M109 Gout, unspecified: Secondary | ICD-10-CM | POA: Diagnosis not present

## 2018-12-08 DIAGNOSIS — D61818 Other pancytopenia: Secondary | ICD-10-CM | POA: Diagnosis not present

## 2018-12-08 DIAGNOSIS — I5020 Unspecified systolic (congestive) heart failure: Secondary | ICD-10-CM | POA: Diagnosis not present

## 2018-12-08 DIAGNOSIS — J449 Chronic obstructive pulmonary disease, unspecified: Secondary | ICD-10-CM | POA: Diagnosis not present

## 2018-12-08 DIAGNOSIS — J9620 Acute and chronic respiratory failure, unspecified whether with hypoxia or hypercapnia: Secondary | ICD-10-CM | POA: Diagnosis not present

## 2018-12-08 DIAGNOSIS — I270 Primary pulmonary hypertension: Secondary | ICD-10-CM | POA: Diagnosis not present

## 2018-12-08 DIAGNOSIS — I4891 Unspecified atrial fibrillation: Secondary | ICD-10-CM | POA: Diagnosis not present

## 2018-12-13 ENCOUNTER — Telehealth: Payer: Self-pay | Admitting: Cardiovascular Disease

## 2018-12-13 DIAGNOSIS — I270 Primary pulmonary hypertension: Secondary | ICD-10-CM | POA: Diagnosis not present

## 2018-12-13 DIAGNOSIS — J9620 Acute and chronic respiratory failure, unspecified whether with hypoxia or hypercapnia: Secondary | ICD-10-CM | POA: Diagnosis not present

## 2018-12-13 DIAGNOSIS — I5020 Unspecified systolic (congestive) heart failure: Secondary | ICD-10-CM | POA: Diagnosis not present

## 2018-12-13 DIAGNOSIS — I110 Hypertensive heart disease with heart failure: Secondary | ICD-10-CM | POA: Diagnosis not present

## 2018-12-13 DIAGNOSIS — N183 Chronic kidney disease, stage 3 (moderate): Secondary | ICD-10-CM | POA: Diagnosis not present

## 2018-12-13 DIAGNOSIS — J449 Chronic obstructive pulmonary disease, unspecified: Secondary | ICD-10-CM | POA: Diagnosis not present

## 2018-12-13 NOTE — Telephone Encounter (Signed)
Lost connection °

## 2018-12-13 NOTE — Telephone Encounter (Signed)
[name] with hospice has been made aware. He stated that he will also let the family know. They are considering discharging him since he has been doing better. He will call the office with an update if needed.

## 2018-12-13 NOTE — Telephone Encounter (Signed)
Okay, I am pleased to hear that [name] is feeling well and eating well.  Let's reset his "weight alarm" to 195 pounds (would consider more diuretic at that weight or higher). [name]

## 2018-12-13 NOTE — Telephone Encounter (Signed)
Sam from Hospice called to give an update on the patient. He stated that the patient's weight yesterday was 191 pounds and then today it was 189. This weight has been consistent since February. The patient is currently eating three meals a day plus snacks. He denies any shortness of breath outside his norm and has no edema.   Sam wanted to give an update and to call back if there were any changes needed in his orders.

## 2018-12-13 NOTE — Telephone Encounter (Signed)
New message   [facility] reports that the patient does not have any swelling and sob at this time. Patient has good appetite eating 3 plus meals a day. Please call to discuss.

## 2019-01-08 ENCOUNTER — Telehealth: Payer: Self-pay | Admitting: Cardiovascular Disease

## 2019-01-08 NOTE — Telephone Encounter (Signed)
Left message for pt to call.

## 2019-01-08 NOTE — Telephone Encounter (Signed)
Spoke with pt wife, she would like the patient to be seen, Follow up scheduled with Curt Bears lawrence dnp who is in the office tomorrow.

## 2019-01-08 NOTE — Telephone Encounter (Signed)
That is a really tough call to make without an office evaluation, even with a telemedicine visit. I would say to make the torsemide 80 mg twice daily if weight is above 195 lb.

## 2019-01-08 NOTE — Telephone Encounter (Signed)
Spoke with pt wife, she reports the patient must be gathering fluid because he is SOB with his oxygen on. She reports SOB just sitting in the chair and worse with walking. She reports a little swelling in his face and eyes but otherwise no edema. His weight today was 193 lb, she is aware his dry weight is 195 lb. He is currently taking 80 mg once daily of torsemide. He has been eating a lot of buttermilk and corn bread and they have realized the corn bread had 400 gm of sodium and they usually keep him to 2 gm. Hospice is no longer in the home they left last week because he is doing much better. [place] are going to the beach at the end of this week and she wants to make sure everything is okay before they go. Will forward to dr c to review and advise.

## 2019-01-08 NOTE — Telephone Encounter (Signed)
Spoke with [name], aware patient is coming to the office tomorrow to be seen. The daughter had called hospice and told them she thinks they need to come back to the home. Will wait until after appointment tomorrow to decide.

## 2019-01-08 NOTE — Telephone Encounter (Signed)
° ° ° ° ° ° ° ° ° ° ° °  [place] is calling to see if the Dr. [name] would be the attending when hospice comes to the home.  Spoke to Amy would like a call back concerning this matter [phone]

## 2019-01-08 NOTE — Telephone Encounter (Signed)
New Message    Pt c/o Shortness Of Breath: STAT if SOB developed within the last 24 hours or pt is noticeably SOB on the phone  1. Are you currently SOB (can you hear that pt is SOB on the phone)? Yes and pt is not talking on the phone   2. How long have you been experiencing SOB? Last night   3. Are you SOB when sitting or when up moving around? Both   4. Are you currently experiencing any other symptoms? Some weight gain

## 2019-01-09 ENCOUNTER — Other Ambulatory Visit: Payer: Self-pay

## 2019-01-09 ENCOUNTER — Encounter: Payer: Self-pay | Admitting: Adult Health

## 2019-01-09 ENCOUNTER — Ambulatory Visit (INDEPENDENT_AMBULATORY_CARE_PROVIDER_SITE_OTHER): Payer: Medicare Other | Admitting: Adult Health

## 2019-01-09 VITALS — BP 118/62 | HR 71 | Temp 97.2°F | Ht 67.0 in | Wt 192.0 lb

## 2019-01-09 DIAGNOSIS — R0602 Shortness of breath: Secondary | ICD-10-CM | POA: Diagnosis not present

## 2019-01-09 DIAGNOSIS — J449 Chronic obstructive pulmonary disease, unspecified: Secondary | ICD-10-CM

## 2019-01-09 DIAGNOSIS — Z79899 Other long term (current) drug therapy: Secondary | ICD-10-CM

## 2019-01-09 DIAGNOSIS — I5043 Acute on chronic combined systolic (congestive) and diastolic (congestive) heart failure: Secondary | ICD-10-CM

## 2019-01-09 DIAGNOSIS — I10 Essential (primary) hypertension: Secondary | ICD-10-CM | POA: Diagnosis not present

## 2019-01-09 MED ORDER — TORSEMIDE 20 MG PO TABS: 80.0000 mg | ORAL_TABLET | Freq: Every day | ORAL | 3 refills | Status: DC

## 2019-01-09 NOTE — Progress Notes (Signed)
Cardiology Office Note   Date:  01/09/2019   ID:  Anthony Agar Sr., DOB [birth year], MRN [id number]  PCP:  [name], MD  Cardiologist:  Croitoru  Advanced CHF: [place]   Chronic CHF   History of Present Illness: Anthony STOLZ Sr. is a 85 y.o. male who presents for  hx of PMH of CKD stage III, afib, HTN, HLD, MDS, NICM, symptomatic bradycardia s/p [employer] CRT-P, COPD on home O2 (3 liters), OSA, RLS and pulmonary HTN  He has end stage CHF EF of 25%-30% and has been a Hospice patient. Hospice called to report that the patient's weight was 191 lbs, but patient has been having non-compliance with dietary restrictions of salt, he states he has been eating a lot of corn bread and drinking butter milk, but the recipe did not have low sodium.    He states that he "dry weight" is 185-187 lbs. He is having to increase his O2 to 4 liters due to worsening breathing over the last week. Weight has been 192 lbs or more at home.  He was given on extra dose of torsemide 20 mg by his wife due to weight gain but did not see any change in his status, symptoms or weight. He is eating a lot of ice at home due to dry mouth.   He denies any chest pressure, fluid retention, PND or orthopnea. He normally sleeps on a wedge and a couple of pillows according to his wife.  He is no longer in Hospice as he improved significantly on medical management.   Past Medical History:  Diagnosis Date  . Arthritis    "feet" (09/30/2017)  . CHF (congestive heart failure) ([place])   . Chronic bronchitis ([place])   . CKD (chronic kidney disease), stage III ([place])    [name] 09/30/2017  . Coronary artery disease   . Diverticulitis   . Dyspnea   . GERD (gastroesophageal reflux disease)   . Gout   . History of blood transfusion    "blood loss" (09/30/2017)  . Hyperlipidemia   . Hypertension   . MDS (myelodysplastic syndrome), high grade ([place]) 05/11/2018  . MI (myocardial infarction) ([place]) ~ 2000   "light one"  .  Nonischemic cardiomyopathy ([facility])    mild  . OSA on CPAP   . Pacemaker single chamber, [place], 2013 10/13/2011  . Permanent atrial fibrillation    on Eliquis  . Pulmonary hypertension ([place])   . Restless legs     Past Surgical History:  Procedure Laterality Date  . APPENDECTOMY  12/2000   [name] 12/22/2010  . BIV UPGRADE N/A 02/22/2018   Procedure: BIV UPGRADE;  Surgeon: [name], MD;  Location: [facility];  Service: Cardiovascular;  Laterality: N/A;  . BIV UPGRADE N/A 03/29/2018   Procedure: BIV PPM UPGRADE;  Surgeon: [name], MD;  Location: [facility];  Service: Cardiovascular;  Laterality: N/A;  . CARDIAC CATHETERIZATION  11/07/2008   nonischemic cardiomyopathy,pulmonary hypertension  . CATARACT EXTRACTION W/ INTRAOCULAR LENS  IMPLANT, BILATERAL Bilateral   . CIRCUMCISION  12/2005   [name] 12/22/2010  . COLOSTOMY  12/2000   [name] 12/22/2010  . COLOSTOMY REVERSAL  07/2001   [name] 12/22/2010  . CORONARY ANGIOPLASTY  06/01/1999   successful to ostium of the first diagonal  . ESOPHAGOGASTRODUODENOSCOPY N/A 08/22/2013   Procedure: ESOPHAGOGASTRODUODENOSCOPY (EGD);  Surgeon: [name], MD;  Location[facility] ENDOSCOPY;  Service: Endoscopy;  Laterality: N/A;  h/p in  file cabinet, Kennyth Lose  . ESOPHAGOGASTRODUODENOSCOPY N/A 11/05/2014   Procedure: ESOPHAGOGASTRODUODENOSCOPY (EGD);  Surgeon: Clarene Essex, MD;  Location: [facility] ENDOSCOPY;  Service: Endoscopy;  Laterality: N/A;  . ESOPHAGOGASTRODUODENOSCOPY N/A 11/08/2016   Procedure: ESOPHAGOGASTRODUODENOSCOPY (EGD);  Surgeon: [name], MD;  Location: [facility] ENDOSCOPY;  Service: Endoscopy;  Laterality: N/A;  . ESOPHAGOGASTRODUODENOSCOPY (EGD) WITH PROPOFOL Left 11/05/2017   Procedure: ESOPHAGOGASTRODUODENOSCOPY (EGD) WITH PROPOFOL;  Surgeon: [name], MD;  Location: [facility];  Service: Endoscopy;  Laterality: Left;  . HOT HEMOSTASIS N/A 11/05/2014   Procedure: HOT HEMOSTASIS (ARGON PLASMA  COAGULATION/BICAP);  Surgeon: Clarene Essex, MD;  Location: [facility] ENDOSCOPY;  Service: Endoscopy;  Laterality: N/A;  . INSERT / REPLACE / REMOVE PACEMAKER    . JOINT REPLACEMENT    . MASS EXCISION Left    hand w/ulnar artery reconstruction/notes 12/22/2010  . NM MYOCAR PERF WALL MOTION  11/24/2007   normal  . PERMANENT PACEMAKER INSERTION  10/04/2012   [employer]  . PERMANENT PACEMAKER INSERTION N/A 10/12/2011   Procedure: PERMANENT PACEMAKER INSERTION;  Surgeon: [name], MD;  Location: [facility] CATH LAB;  Service: Cardiovascular;  Laterality: N/A;  . REPLACEMENT TOTAL KNEE Bilateral 11/2006   right-left/notes 12/22/2010  . ROTATOR CUFF REPAIR Right 09/2004   [name] 12/22/2010  . SAVORY DILATION N/A 08/22/2013   Procedure: SAVORY DILATION;  Surgeon: [name], MD;  Location: [facility] ENDOSCOPY;  Service: Endoscopy;  Laterality: N/A;  . SHOULDER SURGERY Right    "fell off house; messed up 3 things in my arm"  . TONSILLECTOMY    . US ECHOCARDIOGRAPHY  02/01/2011   LA is mod-severely dilated,AOV & root sclerotic,ca+ AOV leaflets     Current Outpatient Medications  Medication Sig Dispense Refill  . acetaminophen (TYLENOL) 650 MG CR tablet Take 1,300 mg by mouth every 8 (eight) hours as needed for pain.    [name] allopurinol (ZYLOPRIM) 100 MG tablet Take 2 tablets (200 mg total) by mouth daily. (Patient taking differently: Take 200 mg by mouth daily as needed (for gout flares). ) 30 tablet 0  . Dextran 70-Hypromellose (ARTIFICIAL TEARS PF OP) Place 2 drops into both eyes 3 (three) times daily as needed (for dryness).    [name] digoxin (LANOXIN) 0.125 MG tablet Take 1 tablet (0.125 mg total) by mouth daily. 30 tablet 0  . diphenhydrAMINE-zinc acetate (BENADRYL) cream Apply topically 2 (two) times daily as needed for itching. 28.4 g 0  . dutasteride (AVODART) 0.5 MG capsule Take 1 capsule (0.5 mg total) by mouth daily. 30 capsule 0  . escitalopram (LEXAPRO) 5 MG tablet Take 1 tablet (5 mg total) by mouth at  bedtime. 30 tablet 0  . ipratropium-albuterol (DUONEB) 0.5-2.5 (3) MG/3ML SOLN Take 3 mLs by nebulization every 4 (four) hours as needed. (Patient taking differently: Take 3 mLs by nebulization every 4 (four) hours as needed (for wheezing or shortness of breath). ) 360 mL 0  . loratadine (CLARITIN) 10 MG tablet Take 10 mg by mouth daily as needed for allergies or itching.     . mirtazapine (REMERON) 15 MG tablet Take 1 tablet (15 mg total) by mouth at bedtime. 30 tablet 0  . Multiple Vitamin (MULTIVITAMIN WITH MINERALS) TABS tablet Take 1 tablet by mouth daily.    . Multiple Vitamins-Minerals (PRESERVISION AREDS 2) CAPS Take 1 capsule by mouth daily.    . OXYGEN Inhale 3 L into the lungs as needed (for shortness of breath).     . pantoprazole (PROTONIX) 40 MG tablet Take 1  tablet (40 mg total) by mouth daily. 30 tablet 0  . potassium chloride (K-DUR,KLOR-CON) 20 MEQ tablet Take 1 tablet (20 mEq total) by mouth 2 (two) times daily. 60 tablet 0  . rOPINIRole (REQUIP) 4 MG tablet Take 1 tablet (4 mg total) by mouth at bedtime. 30 tablet 0  . spironolactone (ALDACTONE) 25 MG tablet Take 1 tablet (25 mg total) by mouth daily. 90 tablet 3  . tamsulosin (FLOMAX) 0.4 MG CAPS capsule Take 1 capsule (0.4 mg total) by mouth daily after breakfast. 30 capsule 0  . torsemide (DEMADEX) 20 MG tablet Take 4 tablets (80 mg total) by mouth daily. Take 80mg  AM and 40mg  PM X3D (W.TH,FRI) 360 tablet 3  . triamcinolone cream (KENALOG) 0.1 % Apply 1 application topically 2 (two) times daily. (Patient taking differently: Apply 1 application topically See admin instructions. Apply to itchy areas two times a day as directed) 45 g 0  . VENTOLIN HFA 108 (90 Base) MCG/ACT inhaler Inhale 2 puffs into the lungs every 6 (six) hours as needed for wheezing or shortness of breath.   0   No current facility-administered medications for this visit.     Allergies:   Vicodin [hydrocodone-acetaminophen]    Social History:  The patient   reports that he has quit smoking. He has never used smokeless tobacco. He reports that he does not drink alcohol or use drugs.   Family History:  The patient's family history includes Cancer in his mother; Diabetes in his brother; Heart attack in his father.    ROS: All other systems are reviewed and negative. Unless otherwise mentioned in H&P    PHYSICAL EXAM: VS:  BP 118/62   Pulse 71   Temp (!) 97.2 F (36.2 C)   Ht 5\' 7"  (1.702 m)   Wt 192 lb (87.1 kg)   SpO2 95%   BMI 30.07 kg/m  , BMI Body mass index is 30.07 kg/m. GEN: Well nourished, well developed, in no acute distress HEENT: normal Neck: no JVD, carotid bruits, or masses Cardiac: RRR 1/6 systolic no murmurs, rubs, or gallops, 1+ pretibial pitting edema  Respiratory: Bibasilar crackles, worse on in the right base to the middle posteriorly and laterally, no anterior crackles,  no wheezes or rhonchi, no cough. Wearing O2 4 liters via [place] GI: soft, nontender, nondistended, + BS MS: no deformity or atrophy Skin: warm and dry, no rash, venous statis skin changes.  Neuro:  Strength and sensation are intact Psych: euthymic mood, full affect   EKG: No EKG completed.   Recent Labs: 05/17/2018: TSH 2.95 06/19/2018: ALT 22 06/27/2018: B Natriuretic Peptide 1,678.1 07/06/2018: Magnesium 2.2 07/09/2018: BUN 73; Creatinine, Ser 1.52; Hemoglobin 9.2; Platelets 41; Potassium 4.2; Sodium 139    Lipid Panel    Component Value Date/Time   CHOL 117 11/04/2017 0458   TRIG 61 11/04/2017 0458   HDL 41 11/04/2017 0458   CHOLHDL 2.9 11/04/2017 0458   VLDL 12 11/04/2017 0458   LDLCALC 64 11/04/2017 0458      Wt Readings from Last 3 Encounters:  01/09/19 192 lb (87.1 kg)  07/09/18 177 lb 3.2 oz (80.4 kg)  06/19/18 184 lb (83.5 kg)      Other studies Reviewed:  Echocardiogram 07/04/2018  Left ventricle: The cavity size was severely dilated. Systolic   function was severely reduced. The estimated ejection fraction   was in  the range of 25% to 30%. There is akinesis of the lateral   and inferolateral myocardium. The study  is not technically   sufficient to allow evaluation of LV diastolic function. - Aortic valve: Trileaflet; mildly thickened, moderately calcified   leaflets. Valve area (VTI): 1.84 cm^2. Valve area (Vmax): 1.86   cm^2. Valve area (Vmean): 1.7 cm^2. - Mitral valve: Calcified annulus. There was mild regurgitation. - Left atrium: The atrium was severely dilated. - Right ventricle: Pacer wire or catheter noted in right ventricle. - Right atrium: The atrium was severely dilated. Pacer wire or   catheter noted in right atrium. - Tricuspid valve: There was mild-moderate regurgitation. - Pulmonic valve: There was moderate regurgitation. - Pulmonary arteries: PA peak pressure: 59 mm Hg (S). - Impressions: There is a 5 x 5cm cystic structure posterior to the   LV and LA of unknown etiology. Possibly pericardial cyst.   Recommend Chest CT for further elucidation.  ASSESSMENT AND PLAN:  1. Acute on chronic systolic CHF: He has gained weight from baseline weight of 185 lbs to 187 lbs per his home scale and his report to 192 lbs. He is having more dyspnea requiring him to increase his O2 on his own up to 4 liters. He does appear mild to moderately volume overloaded.   I will increase his torsemide to 80 mg in the am and 40 mg in the afternoon to take on an empty stomach for better bioavailability. He is to continue daily weights and salt restriction. I have advised him about eating so much ice as this can dry out his mucus membranes causing more mouth dryness.   He will have a BMET, CBC, Dig level and BNP, along with a CXR.  He will be seen again in one week. They are to call for worsening symptoms before that time.   I would like him to be re-established with  Advanced Heart Failure clinic for ongoing management as he is no longer on Hospice and will require closer followup.   2. NICM: Will continue  medical management with diuretics, spironolactone.  Consider adding Entresto if we are to aggressively treat his systolic CHF.   3.  O2 Dependent COPD: He is going up on the O2 to 4 liters. Will see what his CXR results show to evaluate for CHF.   4. PPM in situ-CRT: He will have interrogation completed for ongoing evaluation.    5. Chronic atrial fib: He is on digoxin for rate control but not on anticoagulation due to hx of GI bleed.   Current medicines are reviewed at length with the patient today.    Labs/ tests ordered today include: CXR,BNP, CBC, BMET, Dig level.   Phill Myron. West Pugh, ANP, AACC   01/09/2019 4:55 PM     [facility] [address] [place] [address] Office [phone] Fax [phone]

## 2019-01-09 NOTE — Patient Instructions (Signed)
Medication Instructions:  INCREASE TORSEMIDE 80MG (4) IN THE AM AND 40MG  (2)IN THE PM If you need a refill on your cardiac medications before your next appointment, please call your pharmacy.  Labwork: BMET, BNP, CBC AND DIG TODAY HERE IN OUR OFFICE AT [facility]      Take the provided lab slips with you to the lab for your blood draw.   When you have your labs (blood work) drawn today and your tests are completely normal, you will receive your results only by MyChart Message (if you have MyChart) -OR-  A paper copy in the mail.  If you have any lab test that is abnormal or we need to change your treatment, we will call you to review these results.  Special Instructions: FOLLOW UP APPT WITH HEART FAILURE CLINIC  Follow-Up: You will need a follow up appointment on Monday 01-15-2019 @115PM   WITH  Jory Sims, DNP, AACC .   At [facility], you and your health needs are our priority.  As part of our continuing mission to provide you with exceptional heart care, we have created designated Provider Care Teams.  These Care Teams include your primary Cardiologist (physician) and Advanced Practice Providers (APPs -  Physician Assistants and Nurse Practitioners) who all work together to provide you with the care you need, when you need it.  Thank you for choosing [facility] at [facility]!!

## 2019-01-10 ENCOUNTER — Other Ambulatory Visit: Payer: Self-pay

## 2019-01-10 ENCOUNTER — Ambulatory Visit (INDEPENDENT_AMBULATORY_CARE_PROVIDER_SITE_OTHER): Payer: Medicare Other | Admitting: *Deleted

## 2019-01-10 DIAGNOSIS — R001 Bradycardia, unspecified: Secondary | ICD-10-CM

## 2019-01-10 LAB — DIGOXIN LEVEL: Digoxin, Serum: 1.2 ng/mL — ABNORMAL HIGH (ref 0.5–0.9)

## 2019-01-10 LAB — CUP PACEART REMOTE DEVICE CHECK
Battery Remaining Longevity: 40 mo
Battery Remaining Percentage: 95.5 %
Battery Voltage: 2.96 V
Brady Statistic AP VP Percent: 99 %
Brady Statistic AP VS Percent: 0 %
Brady Statistic AS VP Percent: 1 %
Brady Statistic AS VS Percent: 1 %
Brady Statistic RA Percent Paced: 99 %
Brady Statistic RV Percent Paced: 99 %
Date Time Interrogation Session: 20200603080229
Implantable Lead Implant Date: 20130305
Implantable Lead Implant Date: 20190821
Implantable Lead Location: 753858
Implantable Lead Location: 753860
Implantable Lead Model: 4137
Implantable Lead Model: 4396
Implantable Lead Serial Number: 29020819
Implantable Pulse Generator Implant Date: 20190821
Lead Channel Impedance Value: 410 Ohm
Lead Channel Impedance Value: 590 Ohm
Lead Channel Pacing Threshold Amplitude: 1 V
Lead Channel Pacing Threshold Amplitude: 2.5 V
Lead Channel Pacing Threshold Pulse Width: 0.5 ms
Lead Channel Pacing Threshold Pulse Width: 1 ms
Lead Channel Sensing Intrinsic Amplitude: 12 mV
Lead Channel Sensing Intrinsic Amplitude: 5 mV
Lead Channel Setting Pacing Amplitude: 2.5 V
Lead Channel Setting Pacing Amplitude: 4 V
Lead Channel Setting Pacing Pulse Width: 0.5 ms
Lead Channel Setting Sensing Sensitivity: 5 mV
Pulse Gen Model: 2272
Pulse Gen Serial Number: 9051195

## 2019-01-10 LAB — CBC
Hematocrit: 26.2 % — ABNORMAL LOW (ref 37.5–51.0)
Hemoglobin: 8.5 g/dL — ABNORMAL LOW (ref 13.0–17.7)
MCH: 35.4 pg — ABNORMAL HIGH (ref 26.6–33.0)
MCHC: 32.4 g/dL (ref 31.5–35.7)
MCV: 109 fL — ABNORMAL HIGH (ref 79–97)
Platelets: 106 10*3/uL — ABNORMAL LOW (ref 150–450)
RBC: 2.4 x10E6/uL — CL (ref 4.14–5.80)
RDW: 14.5 % (ref 11.6–15.4)
WBC: 3.9 10*3/uL (ref 3.4–10.8)

## 2019-01-10 LAB — BASIC METABOLIC PANEL
BUN/Creatinine Ratio: 39 — ABNORMAL HIGH (ref 10–24)
BUN: 70 mg/dL — ABNORMAL HIGH (ref 8–27)
CO2: 32 mmol/L — ABNORMAL HIGH (ref 20–29)
Calcium: 9.2 mg/dL (ref 8.6–10.2)
Chloride: 94 mmol/L — ABNORMAL LOW (ref 96–106)
Creatinine, Ser: 1.81 mg/dL — ABNORMAL HIGH (ref 0.76–1.27)
GFR calc Af Amer: 39 mL/min/{1.73_m2} — ABNORMAL LOW (ref >59)
GFR calc non Af Amer: 33 mL/min/{1.73_m2} — ABNORMAL LOW (ref >59)
Glucose: 93 mg/dL (ref 65–99)
Potassium: 4.8 mmol/L (ref 3.5–5.2)
Sodium: 140 mmol/L (ref 134–144)

## 2019-01-10 LAB — BRAIN NATRIURETIC PEPTIDE: BNP: 1654.9 pg/mL — ABNORMAL HIGH (ref 0.0–100.0)

## 2019-01-10 MED ORDER — TORSEMIDE 20 MG PO TABS: 80.0000 mg | ORAL_TABLET | Freq: Every day | ORAL | 3 refills | Status: DC

## 2019-01-10 NOTE — Telephone Encounter (Signed)
Follow up   Per Amy needs to know if does need hospice and if Dr. [name] will be the attendant of record for the patient? Please call to discuss.

## 2019-01-11 ENCOUNTER — Telehealth: Payer: Self-pay

## 2019-01-11 NOTE — Telephone Encounter (Signed)
Left detailed message.   

## 2019-01-11 NOTE — Telephone Encounter (Signed)
-----   Message from Lendon Colonel, NP sent at 01/10/2019  5:28 PM EDT ----- Regarding: BNP labs [name] BNP is elevated >1,600.  He has been told to go up on his lasix to 80 mg in the am and 40 mg in the pm for 3 days. I will see him again on Monday. His labs are reviewed but I could not make a result not for  some reason.  He is to continue the extra lasix 40 mg in the afternoon until I see him again on Monday.

## 2019-01-11 NOTE — Telephone Encounter (Signed)
Yes, I can be the attendant of record MCr

## 2019-01-11 NOTE — Telephone Encounter (Signed)
-----   Message from Lendon Colonel, NP sent at 01/10/2019  5:34 PM EDT ----- Regarding: Labs on [name] I have reviewed his labs. He is anemic but likely from CHF. He has had increased dose of lasix which will assist in this. He will need a follow up BMET and CBC on Monday when I see him.Continue the extra doses of lasix until Monday. (80 mg in am 40 mg in pm).   Curt Bears

## 2019-01-12 ENCOUNTER — Encounter: Payer: Self-pay | Admitting: Cardiovascular Disease

## 2019-01-12 DIAGNOSIS — L409 Psoriasis, unspecified: Secondary | ICD-10-CM | POA: Diagnosis not present

## 2019-01-12 DIAGNOSIS — F418 Other specified anxiety disorders: Secondary | ICD-10-CM | POA: Diagnosis not present

## 2019-01-12 DIAGNOSIS — J9611 Chronic respiratory failure with hypoxia: Secondary | ICD-10-CM | POA: Diagnosis not present

## 2019-01-12 DIAGNOSIS — K219 Gastro-esophageal reflux disease without esophagitis: Secondary | ICD-10-CM | POA: Diagnosis not present

## 2019-01-12 DIAGNOSIS — D631 Anemia in chronic kidney disease: Secondary | ICD-10-CM | POA: Diagnosis not present

## 2019-01-12 DIAGNOSIS — Z9981 Dependence on supplemental oxygen: Secondary | ICD-10-CM | POA: Diagnosis not present

## 2019-01-12 DIAGNOSIS — N400 Enlarged prostate without lower urinary tract symptoms: Secondary | ICD-10-CM | POA: Diagnosis not present

## 2019-01-12 DIAGNOSIS — J449 Chronic obstructive pulmonary disease, unspecified: Secondary | ICD-10-CM | POA: Diagnosis not present

## 2019-01-12 DIAGNOSIS — I5020 Unspecified systolic (congestive) heart failure: Secondary | ICD-10-CM | POA: Diagnosis not present

## 2019-01-12 DIAGNOSIS — D469 Myelodysplastic syndrome, unspecified: Secondary | ICD-10-CM | POA: Diagnosis not present

## 2019-01-12 DIAGNOSIS — M109 Gout, unspecified: Secondary | ICD-10-CM | POA: Diagnosis not present

## 2019-01-12 DIAGNOSIS — Z6830 Body mass index (BMI) 30.0-30.9, adult: Secondary | ICD-10-CM | POA: Diagnosis not present

## 2019-01-12 DIAGNOSIS — R5381 Other malaise: Secondary | ICD-10-CM | POA: Diagnosis not present

## 2019-01-12 DIAGNOSIS — N183 Chronic kidney disease, stage 3 (moderate): Secondary | ICD-10-CM | POA: Diagnosis not present

## 2019-01-12 DIAGNOSIS — G2581 Restless legs syndrome: Secondary | ICD-10-CM | POA: Diagnosis not present

## 2019-01-12 DIAGNOSIS — I480 Paroxysmal atrial fibrillation: Secondary | ICD-10-CM | POA: Diagnosis not present

## 2019-01-12 DIAGNOSIS — I272 Pulmonary hypertension, unspecified: Secondary | ICD-10-CM | POA: Diagnosis not present

## 2019-01-12 NOTE — Telephone Encounter (Signed)
Spoke with [name], aware of dr croitoru's remarks.

## 2019-01-12 NOTE — Telephone Encounter (Signed)
error 

## 2019-01-14 NOTE — Progress Notes (Signed)
Cardiology Office Note   Date:  01/15/2019   ID:  Anthony Agar Sr., DOB 1932/06/20, MRN [id number]  PCP:  [name], MD  Cardiologist:  Dr.  [name]  CC: Follow up CHF   History of Present Illness: Anthony SANZONE Sr. is a 85 y.o. male who presents for close follow-up, with known history of atrial fibrillation, symptomatic bradycardia status post [employer] CR T-P, chronic systolic heart failure with EF of 25% to 30%, nonischemic cardiomyopathy, hypertension, hyperlipidemia, with other history to include COPD on O2 at 3 L, OSA, pulmonary hypertension, restless leg syndrome.  The patient had previously been on hospice but this was discontinued as he was making progress and improving.  The patient admitted to dietary noncompliance with salt having eaten a lot of cornbread and buttermilk.  His dry weight was 185 pound 287 pounds.  On last office visit dated 01/09/2019 the patient's weight had increased to greater than 192 pounds at home, worsening breathing over the last week having him increase his oxygen to 4 L via nasal cannula.  He was given extra doses of 20 mg of torsemide by his wife due to weight gain with no change in his status or symptoms.  He is also eating a lot of ice at home due to a dry mouth.  On assessment during the last office visit, the patient was told to increase his dose of torsemide to 80 mg in the a.m. and 40 mg in the p.m., he is to take it on empty stomach for better bioavailability, he was to continue daily weights and salt restriction.  He had follow-up labs to include a BMET, digoxin level and a BNP along with a CBC.  Was also to have a chest x-ray which was not completed prior to this visit.   BNP revealed significant elevation of 1,654, dig level was slightly elevated at 1.2.  He was found to be anemic with a hemoglobin of 8.5 and hematocrit of 26.2, which is felt to be related to dilution in the setting of decompensated heart failure.  Kidney function  revealed elevated creatinine of 1.81, potassium 4.8 with a sodium of 140.  He is to have follow-up labs today (CBC, BMET,BNP). along with evaluation of his current status.  Chest x-ray apparently was not completed prior to this office visit.    He comes today feeling about the same. He continues to have DOE, He has lost 3 lbs.. He has now been placed back on Hospice. He states that he still feels tired but is dyspneic only with exertion. He continues to be on O2 4/Liters. He is accompanied by his daughter today, who is a retired Marine scientist.  She is now taking over his care.   Past Medical History:  Diagnosis Date  . Arthritis    "feet" (09/30/2017)  . CHF (congestive heart failure) ([place])   . Chronic bronchitis ([place])   . CKD (chronic kidney disease), stage III ([place])    [name] 09/30/2017  . Coronary artery disease   . Diverticulitis   . Dyspnea   . GERD (gastroesophageal reflux disease)   . Gout   . History of blood transfusion    "blood loss" (09/30/2017)  . Hyperlipidemia   . Hypertension   . MDS (myelodysplastic syndrome), high grade ([place]) 05/11/2018  . MI (myocardial infarction) ([place]) ~ 2000   "light one"  . Nonischemic cardiomyopathy ([facility])    mild  . OSA on CPAP   . Pacemaker single chamber, [employer], 2013 10/13/2011  . Permanent atrial fibrillation    on Eliquis  . Pulmonary hypertension ([place])   . Restless legs     Past Surgical History:  Procedure Laterality Date  . APPENDECTOMY  12/2000   [name] 12/22/2010  . BIV UPGRADE N/A 02/22/2018   Procedure: BIV UPGRADE;  Surgeon: [name], MD;  Location: [place] [facility];  Service: Cardiovascular;  Laterality: N/A;  . BIV UPGRADE N/A 03/29/2018   Procedure: BIV PPM UPGRADE;  Surgeon: [name], MD;  Location: [place] CV LAB;  Service: Cardiovascular;  Laterality: N/A;  . CARDIAC CATHETERIZATION  11/07/2008   nonischemic cardiomyopathy,pulmonary hypertension  . CATARACT EXTRACTION W/ INTRAOCULAR LENS   IMPLANT, BILATERAL Bilateral   . CIRCUMCISION  12/2005   [name] 12/22/2010  . COLOSTOMY  12/2000   [name] 12/22/2010  . COLOSTOMY REVERSAL  07/2001   [name] 12/22/2010  . CORONARY ANGIOPLASTY  06/01/1999   successful to ostium of the first diagonal  . ESOPHAGOGASTRODUODENOSCOPY N/A 08/22/2013   Procedure: ESOPHAGOGASTRODUODENOSCOPY (EGD);  Surgeon: [name], MD;  Location: [facility] ENDOSCOPY;  Service: Endoscopy;  Laterality: N/A;  h/p in file cabinet, jackie  . ESOPHAGOGASTRODUODENOSCOPY N/A 11/05/2014   Procedure: ESOPHAGOGASTRODUODENOSCOPY (EGD);  Surgeon: Clarene Essex, MD;  Location: [facility] ENDOSCOPY;  Service: Endoscopy;  Laterality: N/A;  . ESOPHAGOGASTRODUODENOSCOPY N/A 11/08/2016   Procedure: ESOPHAGOGASTRODUODENOSCOPY (EGD);  Surgeon: [name], MD;  Location: [facility] ENDOSCOPY;  Service: Endoscopy;  Laterality: N/A;  . ESOPHAGOGASTRODUODENOSCOPY (EGD) WITH PROPOFOL Left 11/05/2017   Procedure: ESOPHAGOGASTRODUODENOSCOPY (EGD) WITH PROPOFOL;  Surgeon: [name], MD;  Location: [place];  Service: Endoscopy;  Laterality: Left;  . HOT HEMOSTASIS N/A 11/05/2014   Procedure: HOT HEMOSTASIS (ARGON PLASMA COAGULATION/BICAP);  Surgeon: Clarene Essex, MD;  Location: [facility] ENDOSCOPY;  Service: Endoscopy;  Laterality: N/A;  . INSERT / REPLACE / REMOVE PACEMAKER    . JOINT REPLACEMENT    . MASS EXCISION Left    hand w/ulnar artery reconstruction/notes 12/22/2010  . NM MYOCAR PERF WALL MOTION  11/24/2007   normal  . PERMANENT PACEMAKER INSERTION  10/04/2012   [employer]  . PERMANENT PACEMAKER INSERTION N/A 10/12/2011   Procedure: PERMANENT PACEMAKER INSERTION;  Surgeon: [name], MD;  Location: [place] CATH LAB;  Service: Cardiovascular;  Laterality: N/A;  . REPLACEMENT TOTAL KNEE Bilateral 11/2006   right-left/notes 12/22/2010  . ROTATOR CUFF REPAIR Right 09/2004   [name] 12/22/2010  . SAVORY DILATION N/A 08/22/2013   Procedure: SAVORY DILATION;  Surgeon: [name], MD;  Location: [facility]  ENDOSCOPY;  Service: Endoscopy;  Laterality: N/A;  . SHOULDER SURGERY Right    "fell off house; messed up 3 things in my arm"  . TONSILLECTOMY    . US ECHOCARDIOGRAPHY  02/01/2011   LA is mod-severely dilated,AOV & root sclerotic,ca+ AOV leaflets     Current Outpatient Medications  Medication Sig Dispense Refill  . acetaminophen (TYLENOL) 650 MG CR tablet Take 1,300 mg by mouth every 8 (eight) hours as needed for pain.    [name] allopurinol (ZYLOPRIM) 100 MG tablet Take 2 tablets (200 mg total) by mouth daily. (Patient taking differently: Take 200 mg by mouth daily as needed (for gout flares). ) 30 tablet 0  . Dextran 70-Hypromellose (ARTIFICIAL TEARS PF OP) Place 2 drops into both eyes 3 (three) times daily as needed (for dryness).    [name] digoxin (LANOXIN) 0.125 MG tablet Take 1 tablet (0.125 mg total) by mouth daily. 30 tablet 0  . diphenhydrAMINE-zinc acetate (BENADRYL)  cream Apply topically 2 (two) times daily as needed for itching. 28.4 g 0  . dutasteride (AVODART) 0.5 MG capsule Take 1 capsule (0.5 mg total) by mouth daily. 30 capsule 0  . escitalopram (LEXAPRO) 5 MG tablet Take 1 tablet (5 mg total) by mouth at bedtime. 30 tablet 0  . ipratropium-albuterol (DUONEB) 0.5-2.5 (3) MG/3ML SOLN Take 3 mLs by nebulization every 4 (four) hours as needed. (Patient taking differently: Take 3 mLs by nebulization every 4 (four) hours as needed (for wheezing or shortness of breath). ) 360 mL 0  . loratadine (CLARITIN) 10 MG tablet Take 10 mg by mouth daily as needed for allergies or itching.     . mirtazapine (REMERON) 15 MG tablet Take 1 tablet (15 mg total) by mouth at bedtime. 30 tablet 0  . Multiple Vitamin (MULTIVITAMIN WITH MINERALS) TABS tablet Take 1 tablet by mouth daily.    . Multiple Vitamins-Minerals (PRESERVISION AREDS 2) CAPS Take 1 capsule by mouth daily.    . OXYGEN Inhale 3 L into the lungs as needed (for shortness of breath).     . pantoprazole (PROTONIX) 40 MG tablet Take 1 tablet (40 mg  total) by mouth daily. 30 tablet 0  . potassium chloride (K-DUR,KLOR-CON) 20 MEQ tablet Take 1 tablet (20 mEq total) by mouth 2 (two) times daily. 60 tablet 0  . rOPINIRole (REQUIP) 4 MG tablet Take 1 tablet (4 mg total) by mouth at bedtime. 30 tablet 0  . spironolactone (ALDACTONE) 25 MG tablet Take 1 tablet (25 mg total) by mouth daily. 90 tablet 3  . tamsulosin (FLOMAX) 0.4 MG CAPS capsule Take 1 capsule (0.4 mg total) by mouth daily after breakfast. 30 capsule 0  . torsemide (DEMADEX) 20 MG tablet Take 4 tablets (80 mg total) by mouth daily. Take 60m AM and 460mPM X3D (W.TH,FRI) 360 tablet 3  . triamcinolone cream (KENALOG) 0.1 % Apply 1 application topically 2 (two) times daily. (Patient taking differently: Apply 1 application topically See admin instructions. Apply to itchy areas two times a day as directed) 45 g 0  . VENTOLIN HFA 108 (90 Base) MCG/ACT inhaler Inhale 2 puffs into the lungs every 6 (six) hours as needed for wheezing or shortness of breath.   0   No current facility-administered medications for this visit.     Allergies:   Vicodin [hydrocodone-acetaminophen]    Social History:  The patient  reports that he has quit smoking. He has never used smokeless tobacco. He reports that he does not drink alcohol or use drugs.   Family History:  The patient's family history includes Cancer in his mother; Diabetes in his brother; Heart attack in his father.    ROS: All other systems are reviewed and negative. Unless otherwise mentioned in H&P    PHYSICAL EXAM: VS:  BP (!) 118/52   Pulse 70   Temp (!) 97 F (36.1 C)   Wt 197 lb (89.4 kg)   SpO2 99% Comment: Pt is on 4 L og o2  BMI 30.85 kg/m  , BMI Body mass index is 30.85 kg/m. GEN: Well nourished, well developed, in no acute distress HEENT: normal Neck: no JVD, carotid bruits, or masses Cardiac: RRR; no murmurs, rubs, or gallops,no edema  Respiratory: Right sided crackles, no wheezed. Wearing O2 via [place].  GI: soft,  nontender, nondistended, + BS MS: no deformity or atrophy Skin: warm and dry, no rash Neuro:  Strength and sensation are intact Psych: euthymic mood, full affect  EKG:  Not completed today.   Recent Labs: 05/17/2018: TSH 2.95 06/19/2018: ALT 22 07/06/2018: Magnesium 2.2 01/09/2019: BNP 1,654.9; BUN 70; Creatinine, Ser 1.81; Hemoglobin 8.5; Platelets 106; Potassium 4.8; Sodium 140    Lipid Panel    Component Value Date/Time   CHOL 117 11/04/2017 0458   TRIG 61 11/04/2017 0458   HDL 41 11/04/2017 0458   CHOLHDL 2.9 11/04/2017 0458   VLDL 12 11/04/2017 0458   LDLCALC 64 11/04/2017 0458      Wt Readings from Last 3 Encounters:  01/15/19 197 lb (89.4 kg)  01/09/19 192 lb (87.1 kg)  07/09/18 177 lb 3.2 oz (80.4 kg)      Other studies Reviewed: Echocardiogram 07/17/2018  Left ventricle: The cavity size was severely dilated. Systolic function was severely reduced. The estimated ejection fraction was in the range of 25% to 30%. There is akinesis of the lateral and inferolateral myocardium. The study is not technically sufficient to allow evaluation of LV diastolic function. - Aortic valve: Trileaflet; mildly thickened, moderately calcified leaflets. Valve area (VTI): 1.84 cm^2. Valve area (Vmax): 1.86 cm^2. Valve area (Vmean): 1.7 cm^2. - Mitral valve: Calcified annulus. There was mild regurgitation. - Left atrium: The atrium was severely dilated. - Right ventricle: Pacer wire or catheter noted in right ventricle. - Right atrium: The atrium was severely dilated. Pacer wire or catheter noted in right atrium. - Tricuspid valve: There was mild-moderate regurgitation. - Pulmonic valve: There was moderate regurgitation. - Pulmonary arteries: PA peak pressure: 59 mm Hg (S). - Impressions: There is a 5 x 5cm cystic structure posterior to the LV and LA of unknown etiology. Possibly pericardial cyst. Recommend Chest CT for further elucidation.   ASSESSMENT AND  PLAN:  1. Chronic Systolic CHF: He will continue on Torsemide 80-mg in the am and 40 mg in the pm. Continue spironolactone. May need to stop this if creatinine remains elevated. He will have a repeat BMET today and get his CXR.  EF of 25%-30% per echo  2. Anemia: Plan to repeat CBC today for re-evaluation for anemia as last Hgb was 8.5.  3. O2 dependent COPD: Continues on O2 via 4 liters. He states that 3 liters is not enough now.   4. End of Life: He is now on Hospice again as of 01/12/2019. He has met with the Hospice people and is agreeable to this now.  His daughter thought he was able to get IV lasix if po did not work via [employer].  I called Hospice and confirmed that they do not give IV lasix, only IV pain control. This has been explained to the patient and his daughter. They verbalized understanding.    Current medicines are reviewed at length with the patient today.    Labs/ tests ordered today include: BMET,. CBC, and BNP.[name] He will have his CXR today.  Anthony Myron. [name], ANP, AACC   01/15/2019 2:32 PM    [address] Office [phone] Fax [phone]

## 2019-01-15 ENCOUNTER — Other Ambulatory Visit: Payer: Self-pay

## 2019-01-15 ENCOUNTER — Ambulatory Visit
Admission: RE | Admit: 2019-01-15 | Discharge: 2019-01-15 | Disposition: A | Discharge status: Home / Self Care | Payer: Medicare Other | Source: Ambulatory Visit | Attending: Adult Health | Admitting: Adult Health

## 2019-01-15 ENCOUNTER — Encounter: Payer: Self-pay | Admitting: Adult Health

## 2019-01-15 ENCOUNTER — Ambulatory Visit (INDEPENDENT_AMBULATORY_CARE_PROVIDER_SITE_OTHER): Payer: Medicare Other | Admitting: Adult Health

## 2019-01-15 VITALS — BP 118/52 | HR 70 | Temp 97.0°F | Wt 197.0 lb

## 2019-01-15 DIAGNOSIS — D631 Anemia in chronic kidney disease: Secondary | ICD-10-CM

## 2019-01-15 DIAGNOSIS — R0602 Shortness of breath: Secondary | ICD-10-CM

## 2019-01-15 DIAGNOSIS — Z79899 Other long term (current) drug therapy: Secondary | ICD-10-CM

## 2019-01-15 DIAGNOSIS — N189 Chronic kidney disease, unspecified: Secondary | ICD-10-CM | POA: Diagnosis not present

## 2019-01-15 DIAGNOSIS — I5022 Chronic systolic (congestive) heart failure: Secondary | ICD-10-CM

## 2019-01-15 DIAGNOSIS — J449 Chronic obstructive pulmonary disease, unspecified: Secondary | ICD-10-CM

## 2019-01-15 NOTE — Progress Notes (Signed)
I am worried about the potential for dig toxicity with his volatile renal function. Please cut it back to 0.125 mg every other day. Thanks for seeing him MCr

## 2019-01-15 NOTE — Patient Instructions (Signed)
Medication Instructions:  NO CHANGES- Your physician recommends that you continue on your current medications as directed. Please refer to the Current Medication list given to you today. If you need a refill on your cardiac medications before your next appointment, please call your pharmacy.  Labwork: BNP, BMET AND CBC TODAY HERE IN OUR OFFICE AT [facility]   Take the provided lab slips with you to the lab for your blood draw.   When you have your labs (blood work) drawn today and your tests are completely normal, you will receive your results only by MyChart Message (if you have MyChart) -OR-  A paper copy in the mail.  If you have any lab test that is abnormal or we need to change your treatment, we will call you to review these results.  Special Instructions: Chest xray - Your physician has requested that you have a chest xray, is a fast and painless imaging test that uses certain electromagnetic waves to create pictures of the structures in and around your chest. This test can help diagnose and monitor conditions such as pneumonia and other lung issues his will be done at [facility], [place]. If you should need to call them their phone number is [phone].   Follow-Up: You will need a follow up appointment in 1 months 02-23-2019 @ 830AM VIA TELEPHONE.  You may see [name], MD or one of the following Advanced Practice Providers on your designated Care Team:  Almyra Deforest, [place]  Fabian Sharp, PA-C    At [facility], you and your health needs are our priority.  As part of our continuing mission to provide you with exceptional heart care, we have created designated Provider Care Teams.  These Care Teams include your primary Cardiologist (physician) and Advanced Practice Providers (APPs -  Physician Assistants and Nurse Practitioners) who all work together to provide you with the care you need, when you need it.  Thank you for choosing [facility] at [facility]!!

## 2019-01-16 ENCOUNTER — Telehealth: Payer: Self-pay

## 2019-01-16 ENCOUNTER — Telehealth: Payer: Self-pay | Admitting: Adult Health

## 2019-01-16 LAB — CBC
Hematocrit: 25 % — ABNORMAL LOW (ref 37.5–51.0)
Hemoglobin: 8.6 g/dL — ABNORMAL LOW (ref 13.0–17.7)
MCH: 36.8 pg — ABNORMAL HIGH (ref 26.6–33.0)
MCHC: 34.4 g/dL (ref 31.5–35.7)
MCV: 107 fL — ABNORMAL HIGH (ref 79–97)
NRBC: 1 % — ABNORMAL HIGH (ref 0–0)
Platelets: 114 10*3/uL — ABNORMAL LOW (ref 150–450)
RBC: 2.34 x10E6/uL — CL (ref 4.14–5.80)
RDW: 15.2 % (ref 11.6–15.4)
WBC: 3.7 10*3/uL (ref 3.4–10.8)

## 2019-01-16 LAB — BRAIN NATRIURETIC PEPTIDE: BNP: 2258 pg/mL — ABNORMAL HIGH (ref 0.0–100.0)

## 2019-01-16 LAB — BASIC METABOLIC PANEL
BUN/Creatinine Ratio: 43 — ABNORMAL HIGH (ref 10–24)
BUN: 92 mg/dL (ref 8–27)
CO2: 31 mmol/L — ABNORMAL HIGH (ref 20–29)
Calcium: 8.8 mg/dL (ref 8.6–10.2)
Chloride: 95 mmol/L — ABNORMAL LOW (ref 96–106)
Creatinine, Ser: 2.16 mg/dL — ABNORMAL HIGH (ref 0.76–1.27)
GFR calc Af Amer: 31 mL/min/{1.73_m2} — ABNORMAL LOW (ref >59)
GFR calc non Af Amer: 27 mL/min/{1.73_m2} — ABNORMAL LOW (ref >59)
Glucose: 99 mg/dL (ref 65–99)
Potassium: 4.7 mmol/L (ref 3.5–5.2)
Sodium: 137 mmol/L (ref 134–144)

## 2019-01-16 MED ORDER — TORSEMIDE 20 MG PO TABS: ORAL_TABLET | ORAL | 6 refills | Status: AC

## 2019-01-16 NOTE — Telephone Encounter (Signed)
Spoke to patient's daughter [name] lab results given.Patient already taking Lanoxin 0.125 mg daily.Advised to decrease to 1/2 tablet daily.

## 2019-01-16 NOTE — Telephone Encounter (Signed)
Not needed

## 2019-01-16 NOTE — Telephone Encounter (Signed)
New message   Patient's daughter is returning call for test results.

## 2019-01-16 NOTE — Telephone Encounter (Signed)
Spoke to daughter [name] cxr and lab results given.Advised to increase Torsemide to 80 mg twice a day.New prescription sent to pharmacy.

## 2019-01-16 NOTE — Telephone Encounter (Signed)
Spoke to patient's daughter Fabian Sharp DNP advised to stop Lanoxin.

## 2019-01-18 ENCOUNTER — Encounter: Payer: Self-pay | Admitting: Cardiology

## 2019-01-18 DIAGNOSIS — I480 Paroxysmal atrial fibrillation: Secondary | ICD-10-CM | POA: Diagnosis not present

## 2019-01-18 DIAGNOSIS — I5020 Unspecified systolic (congestive) heart failure: Secondary | ICD-10-CM | POA: Diagnosis not present

## 2019-01-18 DIAGNOSIS — N183 Chronic kidney disease, stage 3 (moderate): Secondary | ICD-10-CM | POA: Diagnosis not present

## 2019-01-18 DIAGNOSIS — I272 Pulmonary hypertension, unspecified: Secondary | ICD-10-CM | POA: Diagnosis not present

## 2019-01-18 DIAGNOSIS — J449 Chronic obstructive pulmonary disease, unspecified: Secondary | ICD-10-CM | POA: Diagnosis not present

## 2019-01-18 DIAGNOSIS — J9611 Chronic respiratory failure with hypoxia: Secondary | ICD-10-CM | POA: Diagnosis not present

## 2019-01-18 NOTE — Progress Notes (Signed)
Remote pacemaker transmission.   

## 2019-01-22 DIAGNOSIS — I480 Paroxysmal atrial fibrillation: Secondary | ICD-10-CM | POA: Diagnosis not present

## 2019-01-22 DIAGNOSIS — I272 Pulmonary hypertension, unspecified: Secondary | ICD-10-CM | POA: Diagnosis not present

## 2019-01-22 DIAGNOSIS — J9611 Chronic respiratory failure with hypoxia: Secondary | ICD-10-CM | POA: Diagnosis not present

## 2019-01-22 DIAGNOSIS — I5020 Unspecified systolic (congestive) heart failure: Secondary | ICD-10-CM | POA: Diagnosis not present

## 2019-01-22 DIAGNOSIS — J449 Chronic obstructive pulmonary disease, unspecified: Secondary | ICD-10-CM | POA: Diagnosis not present

## 2019-01-22 DIAGNOSIS — N183 Chronic kidney disease, stage 3 (moderate): Secondary | ICD-10-CM | POA: Diagnosis not present

## 2019-01-25 ENCOUNTER — Telehealth: Payer: Self-pay | Admitting: Cardiovascular Disease

## 2019-01-25 NOTE — Telephone Encounter (Signed)
New Message      [name] from [facility] has questions about medications he would like to ask the nurse    Please call back

## 2019-01-25 NOTE — Telephone Encounter (Signed)
Returned call to Sam with [facility] [phone]  Patient went 2 days without potassium supplement - late on filling it Patient has leg pain (9 of 10) - asking for something stronger than tylenol for pain Last Hgb check was 8.6 Daughter [name] is a Marine scientist and is asking for an iron supplement to "build up his blood"  Dr. Loletha Grayer agreed to be hospice attending (note in epic). Will route to him and his nurse

## 2019-01-26 ENCOUNTER — Other Ambulatory Visit: Payer: Self-pay | Admitting: Cardiovascular Disease

## 2019-01-26 ENCOUNTER — Telehealth: Payer: Self-pay | Admitting: Cardiovascular Disease

## 2019-01-26 NOTE — Telephone Encounter (Signed)
OK to fill

## 2019-01-26 NOTE — Telephone Encounter (Signed)
It looks like the hydroxyzine has been called in. Can have hydrocodone/acetaminophen 5/325, 1-2 tabs every 6h as needed for pain, to be filled via hospice.

## 2019-01-26 NOTE — Telephone Encounter (Signed)
Call returned to the nurse Sam. He has been made aware that Dr. [name] is currently unavailable. He will try and call the hospice provider for these medications.

## 2019-01-26 NOTE — Telephone Encounter (Signed)
New Message  Patient's wife calling in because patient needs a prescription for an electric wheelchair sent to [facility] in order for them to provide the electric wheelchair. Please advise.

## 2019-01-26 NOTE — Telephone Encounter (Signed)
[name] called back from [facility] to follow up on the previous message.  1. He wants something stronger for pain, other than Tylenol 2. The daughter would like an iron supplement due to hemoglobin of 8.6 3. The daughter is also requesting Atarax 25 mg every 6 hours for itching.   Message sent to the provider

## 2019-01-26 NOTE — Telephone Encounter (Signed)
No message needed °

## 2019-01-26 NOTE — Telephone Encounter (Signed)
Yes, please go ahead

## 2019-01-29 DIAGNOSIS — I272 Pulmonary hypertension, unspecified: Secondary | ICD-10-CM | POA: Diagnosis not present

## 2019-01-29 DIAGNOSIS — N183 Chronic kidney disease, stage 3 (moderate): Secondary | ICD-10-CM | POA: Diagnosis not present

## 2019-01-29 DIAGNOSIS — I480 Paroxysmal atrial fibrillation: Secondary | ICD-10-CM | POA: Diagnosis not present

## 2019-01-29 DIAGNOSIS — J9611 Chronic respiratory failure with hypoxia: Secondary | ICD-10-CM | POA: Diagnosis not present

## 2019-01-29 DIAGNOSIS — I5020 Unspecified systolic (congestive) heart failure: Secondary | ICD-10-CM | POA: Diagnosis not present

## 2019-01-29 DIAGNOSIS — J449 Chronic obstructive pulmonary disease, unspecified: Secondary | ICD-10-CM | POA: Diagnosis not present

## 2019-02-02 ENCOUNTER — Emergency Department (HOSPITAL_COMMUNITY)

## 2019-02-02 ENCOUNTER — Encounter (HOSPITAL_COMMUNITY): Payer: Self-pay | Admitting: Emergency Medicine

## 2019-02-02 ENCOUNTER — Telehealth: Payer: Self-pay | Admitting: Cardiovascular Disease

## 2019-02-02 ENCOUNTER — Inpatient Hospital Stay (HOSPITAL_COMMUNITY)
Admission: EM | Admit: 2019-02-02 | Discharge: 2019-02-05 | DRG: 291 | Disposition: A | Discharge status: Hospice | Attending: Internal Medicine | Admitting: Internal Medicine

## 2019-02-02 ENCOUNTER — Encounter: Payer: Self-pay | Admitting: *Deleted

## 2019-02-02 ENCOUNTER — Other Ambulatory Visit: Payer: Self-pay

## 2019-02-02 DIAGNOSIS — D469 Myelodysplastic syndrome, unspecified: Secondary | ICD-10-CM | POA: Diagnosis present

## 2019-02-02 DIAGNOSIS — I110 Hypertensive heart disease with heart failure: Secondary | ICD-10-CM | POA: Diagnosis not present

## 2019-02-02 DIAGNOSIS — Z515 Encounter for palliative care: Secondary | ICD-10-CM | POA: Diagnosis not present

## 2019-02-02 DIAGNOSIS — I5043 Acute on chronic combined systolic (congestive) and diastolic (congestive) heart failure: Secondary | ICD-10-CM | POA: Diagnosis not present

## 2019-02-02 DIAGNOSIS — I959 Hypotension, unspecified: Secondary | ICD-10-CM | POA: Diagnosis not present

## 2019-02-02 DIAGNOSIS — I5084 End stage heart failure: Secondary | ICD-10-CM | POA: Diagnosis not present

## 2019-02-02 DIAGNOSIS — I480 Paroxysmal atrial fibrillation: Secondary | ICD-10-CM | POA: Diagnosis not present

## 2019-02-02 DIAGNOSIS — N170 Acute kidney failure with tubular necrosis: Secondary | ICD-10-CM | POA: Diagnosis not present

## 2019-02-02 DIAGNOSIS — I2510 Atherosclerotic heart disease of native coronary artery without angina pectoris: Secondary | ICD-10-CM | POA: Diagnosis present

## 2019-02-02 DIAGNOSIS — Z66 Do not resuscitate: Secondary | ICD-10-CM | POA: Diagnosis not present

## 2019-02-02 DIAGNOSIS — I130 Hypertensive heart and chronic kidney disease with heart failure and stage 1 through stage 4 chronic kidney disease, or unspecified chronic kidney disease: Secondary | ICD-10-CM | POA: Diagnosis not present

## 2019-02-02 DIAGNOSIS — E785 Hyperlipidemia, unspecified: Secondary | ICD-10-CM | POA: Diagnosis present

## 2019-02-02 DIAGNOSIS — I4821 Permanent atrial fibrillation: Secondary | ICD-10-CM | POA: Diagnosis present

## 2019-02-02 DIAGNOSIS — Z87891 Personal history of nicotine dependence: Secondary | ICD-10-CM

## 2019-02-02 DIAGNOSIS — Z96653 Presence of artificial knee joint, bilateral: Secondary | ICD-10-CM | POA: Diagnosis present

## 2019-02-02 DIAGNOSIS — K219 Gastro-esophageal reflux disease without esophagitis: Secondary | ICD-10-CM | POA: Diagnosis present

## 2019-02-02 DIAGNOSIS — N183 Chronic kidney disease, stage 3 (moderate): Secondary | ICD-10-CM | POA: Diagnosis not present

## 2019-02-02 DIAGNOSIS — N5089 Other specified disorders of the male genital organs: Secondary | ICD-10-CM | POA: Diagnosis present

## 2019-02-02 DIAGNOSIS — S0990XA Unspecified injury of head, initial encounter: Secondary | ICD-10-CM | POA: Diagnosis not present

## 2019-02-02 DIAGNOSIS — R0602 Shortness of breath: Secondary | ICD-10-CM | POA: Diagnosis not present

## 2019-02-02 DIAGNOSIS — I428 Other cardiomyopathies: Secondary | ICD-10-CM | POA: Diagnosis not present

## 2019-02-02 DIAGNOSIS — Z9981 Dependence on supplemental oxygen: Secondary | ICD-10-CM

## 2019-02-02 DIAGNOSIS — R05 Cough: Secondary | ICD-10-CM | POA: Diagnosis not present

## 2019-02-02 DIAGNOSIS — Z885 Allergy status to narcotic agent status: Secondary | ICD-10-CM

## 2019-02-02 DIAGNOSIS — Z950 Presence of cardiac pacemaker: Secondary | ICD-10-CM

## 2019-02-02 DIAGNOSIS — Z888 Allergy status to other drugs, medicaments and biological substances status: Secondary | ICD-10-CM

## 2019-02-02 DIAGNOSIS — Z20828 Contact with and (suspected) exposure to other viral communicable diseases: Secondary | ICD-10-CM | POA: Diagnosis present

## 2019-02-02 DIAGNOSIS — Z9861 Coronary angioplasty status: Secondary | ICD-10-CM

## 2019-02-02 DIAGNOSIS — J449 Chronic obstructive pulmonary disease, unspecified: Secondary | ICD-10-CM | POA: Diagnosis present

## 2019-02-02 DIAGNOSIS — M109 Gout, unspecified: Secondary | ICD-10-CM | POA: Diagnosis present

## 2019-02-02 DIAGNOSIS — I252 Old myocardial infarction: Secondary | ICD-10-CM

## 2019-02-02 DIAGNOSIS — G4733 Obstructive sleep apnea (adult) (pediatric): Secondary | ICD-10-CM | POA: Diagnosis present

## 2019-02-02 DIAGNOSIS — Z833 Family history of diabetes mellitus: Secondary | ICD-10-CM

## 2019-02-02 DIAGNOSIS — J9611 Chronic respiratory failure with hypoxia: Secondary | ICD-10-CM | POA: Diagnosis not present

## 2019-02-02 DIAGNOSIS — N179 Acute kidney failure, unspecified: Secondary | ICD-10-CM | POA: Diagnosis not present

## 2019-02-02 DIAGNOSIS — I482 Chronic atrial fibrillation, unspecified: Secondary | ICD-10-CM | POA: Diagnosis present

## 2019-02-02 DIAGNOSIS — N400 Enlarged prostate without lower urinary tract symptoms: Secondary | ICD-10-CM | POA: Diagnosis present

## 2019-02-02 DIAGNOSIS — D696 Thrombocytopenia, unspecified: Secondary | ICD-10-CM | POA: Diagnosis not present

## 2019-02-02 DIAGNOSIS — I5020 Unspecified systolic (congestive) heart failure: Secondary | ICD-10-CM | POA: Diagnosis not present

## 2019-02-02 DIAGNOSIS — I272 Pulmonary hypertension, unspecified: Secondary | ICD-10-CM | POA: Diagnosis not present

## 2019-02-02 DIAGNOSIS — J9621 Acute and chronic respiratory failure with hypoxia: Secondary | ICD-10-CM | POA: Diagnosis not present

## 2019-02-02 DIAGNOSIS — T502X5A Adverse effect of carbonic-anhydrase inhibitors, benzothiadiazides and other diuretics, initial encounter: Secondary | ICD-10-CM | POA: Diagnosis not present

## 2019-02-02 DIAGNOSIS — I2721 Secondary pulmonary arterial hypertension: Secondary | ICD-10-CM | POA: Diagnosis not present

## 2019-02-02 DIAGNOSIS — G2581 Restless legs syndrome: Secondary | ICD-10-CM | POA: Diagnosis present

## 2019-02-02 DIAGNOSIS — S199XXA Unspecified injury of neck, initial encounter: Secondary | ICD-10-CM | POA: Diagnosis not present

## 2019-02-02 DIAGNOSIS — Z8249 Family history of ischemic heart disease and other diseases of the circulatory system: Secondary | ICD-10-CM

## 2019-02-02 DIAGNOSIS — Z809 Family history of malignant neoplasm, unspecified: Secondary | ICD-10-CM

## 2019-02-02 DIAGNOSIS — I509 Heart failure, unspecified: Secondary | ICD-10-CM

## 2019-02-02 DIAGNOSIS — I5023 Acute on chronic systolic (congestive) heart failure: Secondary | ICD-10-CM | POA: Diagnosis not present

## 2019-02-02 DIAGNOSIS — D539 Nutritional anemia, unspecified: Secondary | ICD-10-CM | POA: Diagnosis not present

## 2019-02-02 DIAGNOSIS — Z1159 Encounter for screening for other viral diseases: Secondary | ICD-10-CM | POA: Diagnosis not present

## 2019-02-02 LAB — CBC WITH DIFFERENTIAL/PLATELET
Abs Immature Granulocytes: 0 10*3/uL (ref 0.00–0.07)
Basophils Absolute: 0 10*3/uL (ref 0.0–0.1)
Basophils Relative: 1 %
Eosinophils Absolute: 0 10*3/uL (ref 0.0–0.5)
Eosinophils Relative: 1 %
HCT: 25.8 % — ABNORMAL LOW (ref 39.0–52.0)
Hemoglobin: 8.3 g/dL — ABNORMAL LOW (ref 13.0–17.0)
Lymphocytes Relative: 7 %
Lymphs Abs: 0.3 10*3/uL — ABNORMAL LOW (ref 0.7–4.0)
MCH: 36.7 pg — ABNORMAL HIGH (ref 26.0–34.0)
MCHC: 32.2 g/dL (ref 30.0–36.0)
MCV: 114.2 fL — ABNORMAL HIGH (ref 80.0–100.0)
Monocytes Absolute: 0.4 10*3/uL (ref 0.1–1.0)
Monocytes Relative: 9 %
Neutro Abs: 3.3 10*3/uL (ref 1.7–7.7)
Neutrophils Relative %: 82 %
Platelets: 108 10*3/uL — ABNORMAL LOW (ref 150–400)
RBC: 2.26 MIL/uL — ABNORMAL LOW (ref 4.22–5.81)
RDW: 19.1 % — ABNORMAL HIGH (ref 11.5–15.5)
WBC: 4 10*3/uL (ref 4.0–10.5)
nRBC: 7.9 % — ABNORMAL HIGH (ref 0.0–0.2)

## 2019-02-02 LAB — COMPREHENSIVE METABOLIC PANEL
ALT: 104 U/L — ABNORMAL HIGH (ref 0–44)
AST: 101 U/L — ABNORMAL HIGH (ref 15–41)
Albumin: 3.3 g/dL — ABNORMAL LOW (ref 3.5–5.0)
Alkaline Phosphatase: 95 U/L (ref 38–126)
Anion gap: 12 (ref 5–15)
BUN: 150 mg/dL — ABNORMAL HIGH (ref 8–23)
CO2: 29 mmol/L (ref 22–32)
Calcium: 8.8 mg/dL — ABNORMAL LOW (ref 8.9–10.3)
Chloride: 88 mmol/L — ABNORMAL LOW (ref 98–111)
Creatinine, Ser: 3.61 mg/dL — ABNORMAL HIGH (ref 0.61–1.24)
GFR calc Af Amer: 17 mL/min — ABNORMAL LOW (ref >60)
GFR calc non Af Amer: 14 mL/min — ABNORMAL LOW (ref >60)
Glucose, Bld: 118 mg/dL — ABNORMAL HIGH (ref 70–99)
Potassium: 4.8 mmol/L (ref 3.5–5.1)
Sodium: 129 mmol/L — ABNORMAL LOW (ref 135–145)
Total Bilirubin: 2.7 mg/dL — ABNORMAL HIGH (ref 0.3–1.2)
Total Protein: 6.4 g/dL — ABNORMAL LOW (ref 6.5–8.1)

## 2019-02-02 LAB — BRAIN NATRIURETIC PEPTIDE: B Natriuretic Peptide: 2562.6 pg/mL — ABNORMAL HIGH (ref 0.0–100.0)

## 2019-02-02 LAB — SARS CORONAVIRUS 2 BY RT PCR (HOSPITAL ORDER, PERFORMED IN ~~LOC~~ HOSPITAL LAB): SARS Coronavirus 2: NEGATIVE

## 2019-02-02 MED ORDER — SODIUM CHLORIDE 0.9 % IV BOLUS
500.0000 mL | Freq: Once | INTRAVENOUS | Status: AC
  Administered 2019-02-02: 500 mL via INTRAVENOUS

## 2019-02-02 NOTE — Telephone Encounter (Signed)
New message:    Patient daughter calling concering getting her father a order for some [place]. Please call patient daughter.

## 2019-02-02 NOTE — ED Notes (Signed)
Patient transported to CT 

## 2019-02-02 NOTE — ED Triage Notes (Addendum)
Per EMS- pt here for eval of edema to abdomen, ankle and scrotum. Pt denies chest pain or shortness of breath. Started on a new diuretic 1 week ago with not much relief. Pt endorses shortness of breath at times. Pt has a cough noted. Out of facility DNR at bedside. Pt wears 3L O2 Nasal cannula all the time.

## 2019-02-02 NOTE — ED Provider Notes (Signed)
[facility] EMERGENCY DEPARTMENT Provider Note   CSN: [id number] Arrival date & time: 02/02/19  1754    History   Chief Complaint Chief Complaint  Patient presents with  . Groin Swelling    HPI Anthony LANESE Sr. is a 85 y.o. male who presents today for evaluation of shortness of breath, edema to abdomen and scrotum.  He has a past medical history of CKD, high-grade myelodysplastic syndrome, pulmonary hypertension, COPD.  History obtained from patient and daughter with patient's permission.  Patient reportedly is under care of hospice.  He has been having worsening scrotal swelling which is where he normally holds extra fluid.  He has had worsening swelling for about 3 weeks.  3 weeks ago he went to his cardiologist where he was seen and evaluated.  Patient has had escalating antidiuretic therapy with home hospice.  He has been taking metolazone 2.5 mg since Monday in addition to 25 mg of at a lactone daily, 80 mg of torsemide twice a day.  According to notes from cardiology office his dry weight is around 183 and his current weight is fluctuating between 196-202.    His daughter reports that she is an Therapist, sports.  He has a DNR and she says that he will choose hospice until he starts having worsening condition and then at that point he "gets scared" like he is now and wishes for aggressive treatment.  She says that his mental status is at his normal level, and he appears to be "fully with it."  Patient states that he hit his head on a door frame 2 to 3 days ago.     HPI  Past Medical History:  Diagnosis Date  . Arthritis    "feet" (09/30/2017)  . CHF (congestive heart failure) ([place])   . Chronic bronchitis ([place])   . CKD (chronic kidney disease), stage III ([place])    [name] 09/30/2017  . Coronary artery disease   . Diverticulitis   . Dyspnea   . GERD (gastroesophageal reflux disease)   . Gout   . History of blood transfusion    "blood loss" (09/30/2017)  . Hyperlipidemia    . Hypertension   . MDS (myelodysplastic syndrome), high grade ([place]) 05/11/2018  . MI (myocardial infarction) ([place]) ~ 2000   "light one"  . Nonischemic cardiomyopathy (HCC)    mild  . OSA on CPAP   . Pacemaker single chamber, [place], 2013 10/13/2011  . Permanent atrial fibrillation    on Eliquis  . Pulmonary hypertension ([place])   . Restless legs     Patient Active Problem List   Diagnosis Date Noted  . Palliative care encounter   . DNR (do not resuscitate)   . PNA (pneumonia) 06/27/2018  . Lethargy 05/16/2018  . MDS (myelodysplastic syndrome), high grade ([place]) 05/11/2018  . Advanced care planning/counseling discussion   . Goals of care, counseling/discussion   . Palliative care by specialist   . AKI (acute kidney injury) ([place])   . Acute on chronic combined systolic and diastolic CHF (congestive heart failure) ([place]) 05/03/2018  . Petechial rash 05/03/2018  . Biventricular cardiac pacemaker  [place]  dual chamber with LV in atrial port  03/29/2018  . Congestive heart failure, NYHA class 3, chronic, systolic ([place]) 67/59/1638  . GI bleed 11/03/2017  . Acute renal failure superimposed on stage 3 chronic kidney disease ([place]) 11/03/2017  . Chest pain 11/03/2017  . Pancytopenia ([place]) 09/26/2017  . CKD (chronic kidney disease), stage III (  [place]) 09/26/2017  . Chronic pain 09/26/2017  . Acute on chronic respiratory failure with hypoxia ([place]) 09/26/2017  . Right shoulder injury, initial encounter 04/08/2017  . Chronic atrial fibrillation 11/23/2016  . Secondary hypercoagulable state ([place])   . Transaminitis   . PAH (pulmonary artery hypertension) ([place])   . Chronic systolic heart failure ([place]) 09/09/2015  . Poor short term memory 11/05/2013  . Fatigue 10/13/2011  . Dyspnea on exertion 10/13/2011  . Restless leg syndrome 10/13/2011  . Chronic anticoagulation 10/13/2011  . Nonischemic cardiomyopathy - coronaries by angiography 2010 10/13/2011  . OSA on CPAP 10/13/2011   . Tremor, hereditary, benign 10/13/2011  . Depression 10/13/2011  . Other secondary pulmonary hypertension ([place]) 10/13/2011  . MRSA (methicillin resistant Staphylococcus aureus) carrier 10/13/2011  . BPH (benign prostatic hyperplasia) 10/13/2011    Past Surgical History:  Procedure Laterality Date  . APPENDECTOMY  12/2000   [name] 12/22/2010  . BIV UPGRADE N/A 02/22/2018   Procedure: BIV UPGRADE;  Surgeon: [name], MD;  Location: [facility] CV LAB;  Service: Cardiovascular;  Laterality: N/A;  . BIV UPGRADE N/A 03/29/2018   Procedure: BIV PPM UPGRADE;  Surgeon: [name], MD;  Location: [facility] CV LAB;  Service: Cardiovascular;  Laterality: N/A;  . CARDIAC CATHETERIZATION  11/07/2008   nonischemic cardiomyopathy,pulmonary hypertension  . CATARACT EXTRACTION W/ INTRAOCULAR LENS  IMPLANT, BILATERAL Bilateral   . CIRCUMCISION  12/2005   [name] 12/22/2010  . COLOSTOMY  12/2000   [name] 12/22/2010  . COLOSTOMY REVERSAL  07/2001   [name] 12/22/2010  . CORONARY ANGIOPLASTY  06/01/1999   successful to ostium of the first diagonal  . ESOPHAGOGASTRODUODENOSCOPY N/A 08/22/2013   Procedure: ESOPHAGOGASTRODUODENOSCOPY (EGD);  Surgeon: [name], MD;  Location: [facility] ENDOSCOPY;  Service: Endoscopy;  Laterality: N/A;  h/p in file cabinet, jackie  . ESOPHAGOGASTRODUODENOSCOPY N/A 11/05/2014   Procedure: ESOPHAGOGASTRODUODENOSCOPY (EGD);  Surgeon: Clarene Essex, MD;  Location: [facility] ENDOSCOPY;  Service: Endoscopy;  Laterality: N/A;  . ESOPHAGOGASTRODUODENOSCOPY N/A 11/08/2016   Procedure: ESOPHAGOGASTRODUODENOSCOPY (EGD);  Surgeon: [name], MD;  Location: [facility] ENDOSCOPY;  Service: Endoscopy;  Laterality: N/A;  . ESOPHAGOGASTRODUODENOSCOPY (EGD) WITH PROPOFOL Left 11/05/2017   Procedure: ESOPHAGOGASTRODUODENOSCOPY (EGD) WITH PROPOFOL;  Surgeon: [name], MD;  Location: [facility];  Service: Endoscopy;  Laterality: Left;  . HOT HEMOSTASIS N/A 11/05/2014   Procedure: HOT HEMOSTASIS  (ARGON PLASMA COAGULATION/BICAP);  Surgeon: Clarene Essex, MD;  Location: [facility] ENDOSCOPY;  Service: Endoscopy;  Laterality: N/A;  . INSERT / REPLACE / REMOVE PACEMAKER    . JOINT REPLACEMENT    . MASS EXCISION Left    hand w/ulnar artery reconstruction/notes 12/22/2010  . NM MYOCAR PERF WALL MOTION  11/24/2007   normal  . PERMANENT PACEMAKER INSERTION  10/04/2012   [employer]  . PERMANENT PACEMAKER INSERTION N/A 10/12/2011   Procedure: PERMANENT PACEMAKER INSERTION;  Surgeon: [name], MD;  Location: [facility] CATH LAB;  Service: Cardiovascular;  Laterality: N/A;  . REPLACEMENT TOTAL KNEE Bilateral 11/2006   right-left/notes 12/22/2010  . ROTATOR CUFF REPAIR Right 09/2004   [name] 12/22/2010  . SAVORY DILATION N/A 08/22/2013   Procedure: SAVORY DILATION;  Surgeon: [name], MD;  Location: [facility] ENDOSCOPY;  Service: Endoscopy;  Laterality: N/A;  . SHOULDER SURGERY Right    "fell off house; messed up 3 things in my arm"  . TONSILLECTOMY    . US ECHOCARDIOGRAPHY  02/01/2011   LA is mod-severely dilated,AOV & root sclerotic,ca+ AOV leaflets  Home Medications    Prior to Admission medications   Medication Sig Start Date End Date Taking? Authorizing Provider  acetaminophen (TYLENOL) 650 MG CR tablet Take 1,300 mg by mouth 2 (two) times a day.    Yes [provider]  allopurinol (ZYLOPRIM) 100 MG tablet Take 2 tablets (200 mg total) by mouth daily. 05/29/18  Yes Medina-Vargas, Monina C, NP  Dextran 70-Hypromellose (ARTIFICIAL TEARS PF OP) Place 2 drops into both eyes 3 (three) times daily as needed (for dryness).   Yes [provider]  diphenhydrAMINE-zinc acetate (BENADRYL) cream Apply topically 2 (two) times daily as needed for itching. Patient taking differently: Apply 1 application topically 2 (two) times daily as needed for itching (apply to affected sites).  05/11/18  Yes Alma Friendly, MD  docusate sodium (COLACE) 100 MG capsule Take 100 mg by mouth daily  with breakfast.   Yes [provider]  escitalopram (LEXAPRO) 5 MG tablet Take 1 tablet (5 mg total) by mouth at bedtime. 05/29/18  Yes Medina-Vargas, Monina C, NP  ferrous sulfate 325 (65 FE) MG tablet Take 325 mg by mouth 2 (two) times daily with a meal.   Yes [provider]  hydrOXYzine (ATARAX/VISTARIL) 25 MG tablet TAKE 1 TABLET BY MOUTH EVERY 6 HOURS AS NEEDED FOR ITCHING Patient taking differently: Take 25 mg by mouth every 6 (six) hours.  01/26/19  Yes Croitoru, Mihai, MD  ipratropium-albuterol (DUONEB) 0.5-2.5 (3) MG/3ML SOLN Take 3 mLs by nebulization every 4 (four) hours as needed. Patient taking differently: Take 3 mLs by nebulization every 4 (four) hours as needed (for wheezing or shortness of breath).  05/29/18  Yes Medina-Vargas, Monina C, NP  loratadine (CLARITIN) 10 MG tablet Take 10 mg by mouth daily with breakfast.    Yes [provider]  metolazone (ZAROXOLYN) 2.5 MG tablet Take 2.5 mg by mouth daily.   Yes [provider]  mirtazapine (REMERON) 15 MG tablet Take 1 tablet (15 mg total) by mouth at bedtime. 05/29/18  Yes Medina-Vargas, Monina C, NP  Multiple Vitamin (MULTIVITAMIN WITH MINERALS) TABS tablet Take 1 tablet by mouth daily.   Yes [provider]  Multiple Vitamins-Minerals (PRESERVISION AREDS 2) CAPS Take 1 capsule by mouth at bedtime.    Yes [provider]  OXYGEN Inhale 4 L into the lungs continuous.    Yes [provider]  pantoprazole (PROTONIX) 40 MG tablet Take 1 tablet (40 mg total) by mouth daily. 05/29/18  Yes Medina-Vargas, Monina C, NP  potassium chloride (K-DUR,KLOR-CON) 20 MEQ tablet Take 1 tablet (20 mEq total) by mouth 2 (two) times daily. 07/09/18  Yes Patrecia Pour, MD  rOPINIRole (REQUIP) 4 MG tablet Take 1 tablet (4 mg total) by mouth at bedtime. Patient taking differently: Take 2-4 mg by mouth See admin instructions. Take 2 mg by mouth in the morning and 4 mg at bedtime 05/29/18  Yes  Medina-Vargas, Monina C, NP  spironolactone (ALDACTONE) 25 MG tablet Take 1 tablet (25 mg total) by mouth daily. 08/17/18  Yes Croitoru, Mihai, MD  tamsulosin (FLOMAX) 0.4 MG CAPS capsule Take 1 capsule (0.4 mg total) by mouth daily after breakfast. 05/29/18  Yes Medina-Vargas, Monina C, NP  torsemide (DEMADEX) 20 MG tablet Take 4 tablets ( 80 mg ) twice a day 01/16/19  Yes Croitoru, Mihai, MD  traMADol (ULTRAM) 50 MG tablet Take 50 mg by mouth every 6 (six) hours as needed (for pain).  01/26/19  Yes [provider]  triamcinolone  cream (KENALOG) 0.1 % Apply 1 application topically 2 (two) times daily. Patient taking differently: Apply 1 application topically 2 (two) times daily as needed (for itching, to affected sites).  05/29/18  Yes Medina-Vargas, Monina C, NP  VENTOLIN HFA 108 (90 Base) MCG/ACT inhaler Inhale 2 puffs into the lungs every 6 (six) hours as needed for wheezing or shortness of breath.  06/20/18  Yes [provider]  dutasteride (AVODART) 0.5 MG capsule Take 1 capsule (0.5 mg total) by mouth daily. Patient not taking: Reported on 02/02/2019 05/29/18   Medina-Vargas, Senaida Lange, NP    Family History Family History  Problem Relation Age of Onset  . Cancer Mother   . Heart attack Father   . Diabetes Brother     Social History Social History   Tobacco Use  . Smoking status: Former Research scientist (life sciences)  . Smokeless tobacco: Never Used  . Tobacco comment: 09/30/2017 "it's been over 66yr since I smoked anything"  Substance Use Topics  . Alcohol use: No  . Drug use: No     Allergies   Vicodin [hydrocodone-acetaminophen] and Eliquis [apixaban]   Review of Systems Review of Systems  Constitutional: Negative for chills and fever.  HENT: Negative for congestion.   Respiratory: Positive for cough (Worsening over the past two days. ) and shortness of breath. Negative for apnea.   Cardiovascular: Negative for chest pain, palpitations and leg swelling.  Gastrointestinal:  Negative for abdominal pain, diarrhea, nausea and vomiting.  Genitourinary: Positive for scrotal swelling. Negative for decreased urine volume, discharge, dysuria, flank pain, hematuria, penile pain, penile swelling, testicular pain and urgency.  Musculoskeletal: Negative for back pain and neck pain.  Psychiatric/Behavioral: Negative for confusion.     Physical Exam Updated Vital Signs BP 97/60 (BP Location: Right Arm)   Pulse 70   Temp 98.1 F (36.7 C) (Oral)   Resp 17   SpO2 98%   Physical Exam Vitals signs and nursing note reviewed. Exam conducted with a chaperone present (Dr. Kathrynn Humble).  Constitutional:      General: He is not in acute distress.    Appearance: He is well-developed. He is not diaphoretic.  HENT:     Head: Normocephalic and atraumatic.  Eyes:     General: No scleral icterus.       Right eye: No discharge.        Left eye: No discharge.     Conjunctiva/sclera: Conjunctivae normal.  Neck:     Musculoskeletal: Normal range of motion.  Cardiovascular:     Rate and Rhythm: Normal rate and regular rhythm.     Pulses: Normal pulses.     Heart sounds: Normal heart sounds.  Pulmonary:     Effort: Pulmonary effort is normal. No respiratory distress.     Breath sounds: No stridor. Rales (Bilateral lower fields. ) present.  Chest:     Chest wall: No tenderness.  Abdominal:     General: There is no distension.  Genitourinary:    Comments: There is marked edema of the testes bilaterally with out redness, induration, wounds or ecchymosis.  While laying in bed testes extend approximately 2/3 down his thigh.   Musculoskeletal:        General: No deformity.     Right lower leg: No edema.     Left lower leg: No edema.  Skin:    General: Skin is warm and dry.  Neurological:     General: No focal deficit present.     Mental Status: He is  alert and oriented to person, place, and time. Mental status is at baseline.     Motor: No abnormal muscle tone.  Psychiatric:         Behavior: Behavior normal.      ED Treatments / Results  Labs (all labs ordered are listed, but only abnormal results are displayed) Labs Reviewed  COMPREHENSIVE METABOLIC PANEL - Abnormal; Notable for the following components:      Result Value   Sodium 129 (*)    Chloride 88 (*)    Glucose, Bld 118 (*)    BUN 150 (*)    Creatinine, Ser 3.61 (*)    Calcium 8.8 (*)    Total Protein 6.4 (*)    Albumin 3.3 (*)    AST 101 (*)    ALT 104 (*)    Total Bilirubin 2.7 (*)    GFR calc non Af Amer 14 (*)    GFR calc Af Amer 17 (*)    All other components within normal limits  CBC WITH DIFFERENTIAL/PLATELET - Abnormal; Notable for the following components:   RBC 2.26 (*)    Hemoglobin 8.3 (*)    HCT 25.8 (*)    MCV 114.2 (*)    MCH 36.7 (*)    RDW 19.1 (*)    Platelets 108 (*)    nRBC 7.9 (*)    Lymphs Abs 0.3 (*)    All other components within normal limits  BRAIN NATRIURETIC PEPTIDE - Abnormal; Notable for the following components:   B Natriuretic Peptide 2,562.6 (*)    All other components within normal limits  SARS CORONAVIRUS 2 (HOSPITAL ORDER, [place] [facility])    EKG EKG Interpretation  Date/Time:  Friday February 02 2019 19:25:06 EDT Ventricular Rate:  70 PR Interval:    QRS Duration: 193 QT Interval:  436 QTC Calculation: 471 R Axis:   12 Text Interpretation:  Accelerated junctional rhythm Right bundle branch block LVH with IVCD and secondary repol abnrm Nonspecific ST and T wave abnormality No significant change since last tracing Confirmed by Varney Biles [phone]) on 02/02/2019 9:03:11 PM   Radiology Ct Head Wo Contrast  Result Date: 02/02/2019 CLINICAL DATA:  Head trauma EXAM: CT HEAD WITHOUT CONTRAST CT CERVICAL SPINE WITHOUT CONTRAST TECHNIQUE: Multidetector CT imaging of the head and cervical spine was performed following the standard protocol without intravenous contrast. Multiplanar CT image reconstructions of the cervical spine  were also generated. COMPARISON:  02/11/2018, 11/03/2017 FINDINGS: CT HEAD FINDINGS Brain: No evidence of acute infarction, hemorrhage, hydrocephalus, extra-axial collection or mass lesion/mass effect. Periventricular white matter hypodensity and global volume loss. Vascular: No hyperdense vessel or unexpected calcification. Skull: Normal. Negative for fracture or focal lesion. Sinuses/Orbits: No acute finding. Other: None. CT CERVICAL SPINE FINDINGS Examination of the cervical spine is significantly limited by patient motion artifact. Alignment: Normal. Skull base and vertebrae: No acute fracture. No primary bone lesion or focal pathologic process. Soft tissues and spinal canal: No prevertebral fluid or swelling. No visible canal hematoma. Disc levels: Focally severe disc degenerative disease and osteophytosis at C4-C5 with endplate sclerosis, substantially worsened in comparison to CT dated 11/03/2017. Upper chest: Negative. Other: None. IMPRESSION: 1. No acute intracranial pathology. Small-vessel white matter disease and global volume loss. 2. Examination of the cervical spine is significantly limited by patient motion artifact. Within this limitation, no obvious fracture or static subluxation of the cervical spine. 3. Focally severe disc degenerative disease and osteophytosis at C4-C5 with endplate sclerosis, substantially  worsened in comparison to CT dated 11/03/2017. Electronically Signed   By: Eddie Candle M.D.   On: 02/02/2019 20:08   Ct Cervical Spine Wo Contrast  Result Date: 02/02/2019 CLINICAL DATA:  Head trauma EXAM: CT HEAD WITHOUT CONTRAST CT CERVICAL SPINE WITHOUT CONTRAST TECHNIQUE: Multidetector CT imaging of the head and cervical spine was performed following the standard protocol without intravenous contrast. Multiplanar CT image reconstructions of the cervical spine were also generated. COMPARISON:  02/11/2018, 11/03/2017 FINDINGS: CT HEAD FINDINGS Brain: No evidence of acute infarction,  hemorrhage, hydrocephalus, extra-axial collection or mass lesion/mass effect. Periventricular white matter hypodensity and global volume loss. Vascular: No hyperdense vessel or unexpected calcification. Skull: Normal. Negative for fracture or focal lesion. Sinuses/Orbits: No acute finding. Other: None. CT CERVICAL SPINE FINDINGS Examination of the cervical spine is significantly limited by patient motion artifact. Alignment: Normal. Skull base and vertebrae: No acute fracture. No primary bone lesion or focal pathologic process. Soft tissues and spinal canal: No prevertebral fluid or swelling. No visible canal hematoma. Disc levels: Focally severe disc degenerative disease and osteophytosis at C4-C5 with endplate sclerosis, substantially worsened in comparison to CT dated 11/03/2017. Upper chest: Negative. Other: None. IMPRESSION: 1. No acute intracranial pathology. Small-vessel white matter disease and global volume loss. 2. Examination of the cervical spine is significantly limited by patient motion artifact. Within this limitation, no obvious fracture or static subluxation of the cervical spine. 3. Focally severe disc degenerative disease and osteophytosis at C4-C5 with endplate sclerosis, substantially worsened in comparison to CT dated 11/03/2017. Electronically Signed   By: Eddie Candle M.D.   On: 02/02/2019 20:08   Dg Chest Port 1 View  Result Date: 02/02/2019 CLINICAL DATA:  Cough and congestive heart failure EXAM: PORTABLE CHEST 1 VIEW COMPARISON:  January 15, 2019 FINDINGS: The heart size remains enlarged. Left-sided pacemaker is noted. The lung volumes are somewhat low. Advanced degenerative changes are noted of both glenohumeral joints. There is no pneumothorax. Aortic calcifications are noted. IMPRESSION: Cardiomegaly with mild vascular congestion. Electronically Signed   By: Constance Holster M.D.   On: 02/02/2019 19:23    Procedures Procedures (including critical care time)  Medications Ordered  in ED Medications - No data to display   Initial Impression / Assessment and Plan / ED Course  I have reviewed the triage vital signs and the nursing notes.  Pertinent labs & imaging results that were available during my care of the patient were reviewed by me and considered in my medical decision making (see chart for details).  Clinical Course as of Feb 02 2231  Fri Feb 02, 2019  1825 Scrotal swelling started Tuesday, has gotten worse.  About three weeks ago went to cardiologist, daughter reports that he is scarred, wants to be agggressive in the care again.  Heart failure and COPD.  [facility], in [place], 2 days increased coughing.  4 liters of oxygen at home.  Daughter is RN,    [EH]  2228 Spoke with Dr. Jonelle Sidle who will admit patient.    [EH]    Clinical Course User Index [EH] Lorin Glass, PA-C      Patient presents today for evaluation of scrotal swelling, and shortness of breath.  He has a history of CHF.  He is under care of [facility] in [place] where he has been having increasing p.o. antidiuretic therapy.  He has had significant worsening of scrotal swelling over the past 2 to 3 days.  He denies pain in his scrotum.  On  exam he has significant edema of his scrotum which transilluminates and feels fluid-filled without evidence of infection.  Labs were obtained and reviewed.  His creatinine is significantly worse today at 3.61, up from 2.16 on 01/15/2019 with a GFR change from 27 down to 14 consistent with an AKI.  I suspect that this is in part due to the multiple diuretics that he is on.    Chest x-ray was obtained showing cardiomegaly with mild vascular congestion.  As he reports that he struck his head based on his age CT scan of head and neck was obtained without evidence of intracranial hemorrhage or other acute abnormalities.    His hemoglobin is low at 8.3 which appears consistent with his baseline along with thrombocytopenia at 108 again consistent  with his baseline.  Based on the chronic nature of his symptoms do not suspect ACS.    This patient was seen as a shared visit with Dr. Kathrynn Humble  I had discussions with both the patient, and with his permission, his daughter about goals of care.  Patient does have a DNR however he reports that he generally feels unwell and wishes for aggressive treatment including dialysis if needed.  His BNP is markedly elevated despite aggressive at home diuresis.  I did discuss with patient that it would be reasonable, based on his goals of care, to forego admission however he again insisted that he wants aggressive treatment and dialysis if needed.  Hospitalist consulted for admission.  I spoke with Dr. Jonelle Sidle who will admit the patient.   Final Clinical Impressions(s) / ED Diagnoses   Final diagnoses:  Acute on chronic congestive heart failure, unspecified heart failure type [facility])  Testicle swelling  AKI (acute kidney injury) [facility])    ED Discharge Orders    None       Ollen Gross 02/02/19 2232    Varney Biles, MD 02/03/19 1226

## 2019-02-02 NOTE — Telephone Encounter (Signed)
Discussed with Dr Harrell Gave DOD and she recommended patient going to ED for diuresis if he chooses given he is in Hospice. Advised daughter, verbalized understanding.

## 2019-02-02 NOTE — H&P (Signed)
History and Physical   Joziyah Roblero XKG:[id number] DOB: 30-Jan-1932 DOA: 02/02/2019  Referring MD/NP/PA: Dr. Kathrynn Humble  PCP: [name], MD   Outpatient Specialists: Hospice care  Patient coming from: Home  Chief Complaint: Generalized swelling with shortness of breath  HPI: ALOYSIUS HEINLE Sr. is a 85 y.o. male with medical history significant of congestive heart failure systolic in nature, chronic kidney disease stage IV, myelodysplastic syndrome, pulmonary hypertension, COPD who is on hospice care that came in due to progressive swelling including his scrotum and generalized weakness.  Patient was seen recently by his cardiologist and his diuretics dose was increased.  He is on both Zaroxolyn as well as torsemide.  Also on Aldactone.  He has noted increasing his weight of over 10 pounds.  He has been DNR and fully waited.  Patient has opted for full care at this point essentially rescinding his DNR.  He is fully awake and alert.  He is noted to have worsening renal function with baseline creatinine of 1.6 but now more than 3.  He has evidence of fluid overload but intravascular contraction.  He is being admitted with acute on chronic kidney failure with CHF.  Patient essentially has not tolerated aggressive diuresis.  His current fluid overload could all be secondary to renal failure.  He is being admitted for work-up..   ED Course: Temperature 97.4, blood pressure 86/55 with pulse 76 respiratory of 18 oxygen sat 95% room air.  Sodium is 129 potassium 4.8 chloride 88 CO2 29 glucose 118.  BUN is 150 and creatinine 3.61.  Previous creatinine was 2.16 on June 8 and sodium was 137.  LFTs appear to be elevated.  White count is 4.0 hemoglobin 8.3 and platelets of 108.  BNP is 2562.  It was 2258 on June 8.  Chest x-ray showed cardiomegaly with mild vascular congestion.  CT head and cervical spine showed no significant findings.  Patient has been hypotensive and received fluid in the ER.   Currently being admitted for treatment.  Review of Systems: As per HPI otherwise 10 point review of systems negative.    Past Medical History:  Diagnosis Date  . Arthritis    "feet" (09/30/2017)  . CHF (congestive heart failure) ([place])   . Chronic bronchitis ([place])   . CKD (chronic kidney disease), stage III ([place])    [name] 09/30/2017  . Coronary artery disease   . Diverticulitis   . Dyspnea   . GERD (gastroesophageal reflux disease)   . Gout   . History of blood transfusion    "blood loss" (09/30/2017)  . Hyperlipidemia   . Hypertension   . MDS (myelodysplastic syndrome), high grade ([place]) 05/11/2018  . MI (myocardial infarction) ([place]) ~ 2000   "light one"  . Nonischemic cardiomyopathy (HCC)    mild  . OSA on CPAP   . Pacemaker single chamber, [place], 2013 10/13/2011  . Permanent atrial fibrillation    on Eliquis  . Pulmonary hypertension ([place])   . Restless legs     Past Surgical History:  Procedure Laterality Date  . APPENDECTOMY  12/2000   [name] 12/22/2010  . BIV UPGRADE N/A 02/22/2018   Procedure: BIV UPGRADE;  Surgeon: [name], MD;  Location: [facility] [facility];  Service: Cardiovascular;  Laterality: N/A;  . BIV UPGRADE N/A 03/29/2018   Procedure: BIV PPM UPGRADE;  Surgeon: [name], MD;  Location: [facility];  Service: Cardiovascular;  Laterality: N/A;  . CARDIAC  CATHETERIZATION  11/07/2008   nonischemic cardiomyopathy,pulmonary hypertension  . CATARACT EXTRACTION W/ INTRAOCULAR LENS  IMPLANT, BILATERAL Bilateral   . CIRCUMCISION  12/2005   [name] 12/22/2010  . COLOSTOMY  12/2000   [name] 12/22/2010  . COLOSTOMY REVERSAL  07/2001   [name] 12/22/2010  . CORONARY ANGIOPLASTY  06/01/1999   successful to ostium of the first diagonal  . ESOPHAGOGASTRODUODENOSCOPY N/A 08/22/2013   Procedure: ESOPHAGOGASTRODUODENOSCOPY (EGD);  Surgeon: [name], MD;  Location: [facility] ENDOSCOPY;  Service: Endoscopy;  Laterality: N/A;  h/p in file cabinet,  jackie  . ESOPHAGOGASTRODUODENOSCOPY N/A 11/05/2014   Procedure: ESOPHAGOGASTRODUODENOSCOPY (EGD);  Surgeon: Clarene Essex, MD;  Location: [facility] ENDOSCOPY;  Service: Endoscopy;  Laterality: N/A;  . ESOPHAGOGASTRODUODENOSCOPY N/A 11/08/2016   Procedure: ESOPHAGOGASTRODUODENOSCOPY (EGD);  Surgeon: [name], MD;  Location: [facility] ENDOSCOPY;  Service: Endoscopy;  Laterality: N/A;  . ESOPHAGOGASTRODUODENOSCOPY (EGD) WITH PROPOFOL Left 11/05/2017   Procedure: ESOPHAGOGASTRODUODENOSCOPY (EGD) WITH PROPOFOL;  Surgeon: [name], MD;  Location: [place];  Service: Endoscopy;  Laterality: Left;  . HOT HEMOSTASIS N/A 11/05/2014   Procedure: HOT HEMOSTASIS (ARGON PLASMA COAGULATION/BICAP);  Surgeon: Clarene Essex, MD;  Location: [facility] ENDOSCOPY;  Service: Endoscopy;  Laterality: N/A;  . INSERT / REPLACE / REMOVE PACEMAKER    . JOINT REPLACEMENT    . MASS EXCISION Left    hand w/ulnar artery reconstruction/notes 12/22/2010  . NM MYOCAR PERF WALL MOTION  11/24/2007   normal  . PERMANENT PACEMAKER INSERTION  10/04/2012   [employer]  . PERMANENT PACEMAKER INSERTION N/A 10/12/2011   Procedure: PERMANENT PACEMAKER INSERTION;  Surgeon: [name], MD;  Location: [place] CATH LAB;  Service: Cardiovascular;  Laterality: N/A;  . REPLACEMENT TOTAL KNEE Bilateral 11/2006   right-left/notes 12/22/2010  . ROTATOR CUFF REPAIR Right 09/2004   [name] 12/22/2010  . SAVORY DILATION N/A 08/22/2013   Procedure: SAVORY DILATION;  Surgeon: [name], MD;  Location: [facility] ENDOSCOPY;  Service: Endoscopy;  Laterality: N/A;  . SHOULDER SURGERY Right    "fell off house; messed up 3 things in my arm"  . TONSILLECTOMY    . US ECHOCARDIOGRAPHY  02/01/2011   LA is mod-severely dilated,AOV & root sclerotic,ca+ AOV leaflets     reports that he has quit smoking. He has never used smokeless tobacco. He reports that he does not drink alcohol or use drugs.  Allergies  Allergen Reactions  . Vicodin [Hydrocodone-Acetaminophen] Itching and  Nausea Only  . Eliquis [Apixaban] Other (See Comments)    "Caused bleeding issues"    Family History  Problem Relation Age of Onset  . Cancer Mother   . Heart attack Father   . Diabetes Brother      Prior to Admission medications   Medication Sig Start Date End Date Taking? Authorizing Provider  acetaminophen (TYLENOL) 650 MG CR tablet Take 1,300 mg by mouth 2 (two) times a day.    Yes [provider]  allopurinol (ZYLOPRIM) 100 MG tablet Take 2 tablets (200 mg total) by mouth daily. 05/29/18  Yes Medina-Vargas, Monina C, NP  Dextran 70-Hypromellose (ARTIFICIAL TEARS PF OP) Place 2 drops into both eyes 3 (three) times daily as needed (for dryness).   Yes [provider]  diphenhydrAMINE-zinc acetate (BENADRYL) cream Apply topically 2 (two) times daily as needed for itching. Patient taking differently: Apply 1 application topically 2 (two) times daily as needed for itching (apply to affected sites).  05/11/18  Yes Alma Friendly, MD  docusate sodium (COLACE) 100 MG capsule Take 100  mg by mouth daily with breakfast.   Yes [provider]  escitalopram (LEXAPRO) 5 MG tablet Take 1 tablet (5 mg total) by mouth at bedtime. 05/29/18  Yes Medina-Vargas, Monina C, NP  ferrous sulfate 325 (65 FE) MG tablet Take 325 mg by mouth 2 (two) times daily with a meal.   Yes [provider]  hydrOXYzine (ATARAX/VISTARIL) 25 MG tablet TAKE 1 TABLET BY MOUTH EVERY 6 HOURS AS NEEDED FOR ITCHING Patient taking differently: Take 25 mg by mouth every 6 (six) hours.  01/26/19  Yes Croitoru, Mihai, MD  ipratropium-albuterol (DUONEB) 0.5-2.5 (3) MG/3ML SOLN Take 3 mLs by nebulization every 4 (four) hours as needed. Patient taking differently: Take 3 mLs by nebulization every 4 (four) hours as needed (for wheezing or shortness of breath).  05/29/18  Yes Medina-Vargas, Monina C, NP  loratadine (CLARITIN) 10 MG tablet Take 10 mg by mouth daily with breakfast.    Yes [provider]  metolazone (ZAROXOLYN) 2.5 MG tablet Take 2.5 mg by mouth daily.   Yes [provider]  mirtazapine (REMERON) 15 MG tablet Take 1 tablet (15 mg total) by mouth at bedtime. 05/29/18  Yes Medina-Vargas, Monina C, NP  Multiple Vitamin (MULTIVITAMIN WITH MINERALS) TABS tablet Take 1 tablet by mouth daily.   Yes [provider]  Multiple Vitamins-Minerals (PRESERVISION AREDS 2) CAPS Take 1 capsule by mouth at bedtime.    Yes [provider]  OXYGEN Inhale 4 L into the lungs continuous.    Yes [provider]  pantoprazole (PROTONIX) 40 MG tablet Take 1 tablet (40 mg total) by mouth daily. 05/29/18  Yes Medina-Vargas, Monina C, NP  potassium chloride (K-DUR,KLOR-CON) 20 MEQ tablet Take 1 tablet (20 mEq total) by mouth 2 (two) times daily. 07/09/18  Yes Patrecia Pour, MD  rOPINIRole (REQUIP) 4 MG tablet Take 1 tablet (4 mg total) by mouth at bedtime. Patient taking differently: Take 2-4 mg by mouth See admin instructions. Take 2 mg by mouth in the morning and 4 mg at bedtime 05/29/18  Yes Medina-Vargas, Monina C, NP  spironolactone (ALDACTONE) 25 MG tablet Take 1 tablet (25 mg total) by mouth daily. 08/17/18  Yes Croitoru, Mihai, MD  tamsulosin (FLOMAX) 0.4 MG CAPS capsule Take 1 capsule (0.4 mg total) by mouth daily after breakfast. 05/29/18  Yes Medina-Vargas, Monina C, NP  torsemide (DEMADEX) 20 MG tablet Take 4 tablets ( 80 mg ) twice a day 01/16/19  Yes Croitoru, Mihai, MD  traMADol (ULTRAM) 50 MG tablet Take 50 mg by mouth every 6 (six) hours as needed (for pain).  01/26/19  Yes [provider]  triamcinolone cream (KENALOG) 0.1 % Apply 1 application topically 2 (two) times daily. Patient taking differently: Apply 1 application topically 2 (two) times daily as needed (for itching, to affected sites).  05/29/18  Yes Medina-Vargas, Monina C, NP  VENTOLIN HFA 108 (90 Base) MCG/ACT inhaler Inhale 2 puffs into the lungs every 6 (six) hours as needed  for wheezing or shortness of breath.  06/20/18  Yes [provider]  dutasteride (AVODART) 0.5 MG capsule Take 1 capsule (0.5 mg total) by mouth daily. Patient not taking: Reported on 02/02/2019 05/29/18   Nickola Major, NP    Physical Exam: Vitals:   02/02/19 2245 02/02/19 2300 02/02/19 2315 02/02/19 2330  BP: (!) 86/55 90/67 91/62  92/62  Pulse:  69 70 70  Resp: 14 15 15 14   Temp:      TempSrc:  SpO2:  99% 98% 98%      Constitutional: Chronically ill looking, debilitated Vitals:   02/02/19 2245 02/02/19 2300 02/02/19 2315 02/02/19 2330  BP: (!) 86/55 90/67 91/62  92/62  Pulse:  69 70 70  Resp: 14 15 15 14   Temp:      TempSrc:      SpO2:  99% 98% 98%   Eyes: PERRL, lids and conjunctivae normal, sunken eyes ENMT: Mucous membranes are moist. Posterior pharynx clear of any exudate or lesions.Normal dentition.  Neck: normal, supple, no masses, no thyromegaly Respiratory: Decreased air entry bilaterally with basal crackles normal respiratory effort. No accessory muscle use.  Cardiovascular: Irregularly irregular no murmurs / rubs / gallops. 2+ extremity edema. 2+ pedal pulses. No carotid bruits.  Abdomen: no tenderness, no masses palpated. No hepatosplenomegaly. Bowel sounds positive.  Scrotal edema Musculoskeletal: no clubbing / cyanosis. No joint deformity upper and lower extremities. Good ROM, no contractures. Normal muscle tone.  Skin: no rashes, lesions, ulcers. No induration Neurologic: CN 2-12 grossly intact. Sensation intact, DTR normal. Strength 5/5 in all 4.  Psychiatric: Normal judgment and insight. Alert and oriented x 3. Normal mood.     Labs on Admission: I have personally reviewed following labs and imaging studies  CBC: Recent Labs  Lab 02/02/19 1832  WBC 4.0  NEUTROABS 3.3  HGB 8.3*  HCT 25.8*  MCV 114.2*  PLT 660*   Basic Metabolic Panel: Recent Labs  Lab 02/02/19 1832  NA 129*  K 4.8  CL 88*  CO2 29  GLUCOSE 118*  BUN  150*  CREATININE 3.61*  CALCIUM 8.8*   GFR: CrCl cannot be calculated (Unknown ideal weight.). Liver Function Tests: Recent Labs  Lab 02/02/19 1832  AST 101*  ALT 104*  ALKPHOS 95  BILITOT 2.7*  PROT 6.4*  ALBUMIN 3.3*   No results for input(s): LIPASE, AMYLASE in the last 168 hours. No results for input(s): AMMONIA in the last 168 hours. Coagulation Profile: No results for input(s): INR, PROTIME in the last 168 hours. Cardiac Enzymes: No results for input(s): CKTOTAL, CKMB, CKMBINDEX, TROPONINI in the last 168 hours. BNP (last 3 results) No results for input(s): PROBNP in the last 8760 hours. HbA1C: No results for input(s): HGBA1C in the last 72 hours. CBG: No results for input(s): GLUCAP in the last 168 hours. Lipid Profile: No results for input(s): CHOL, HDL, LDLCALC, TRIG, CHOLHDL, LDLDIRECT in the last 72 hours. Thyroid Function Tests: No results for input(s): TSH, T4TOTAL, FREET4, T3FREE, THYROIDAB in the last 72 hours. Anemia Panel: No results for input(s): VITAMINB12, FOLATE, FERRITIN, TIBC, IRON, RETICCTPCT in the last 72 hours. Urine analysis:    Component Value Date/Time   COLORURINE YELLOW 05/05/2018 2247   APPEARANCEUR CLEAR 05/05/2018 2247   LABSPEC 1.008 05/05/2018 2247   PHURINE 5.0 05/05/2018 2247   GLUCOSEU NEGATIVE 05/05/2018 2247   HGBUR NEGATIVE 05/05/2018 2247   BILIRUBINUR NEGATIVE 05/05/2018 2247   KETONESUR NEGATIVE 05/05/2018 2247   PROTEINUR NEGATIVE 05/05/2018 2247   NITRITE NEGATIVE 05/05/2018 2247   LEUKOCYTESUR NEGATIVE 05/05/2018 2247   Sepsis Labs: @LABRCNTIP (procalcitonin:4,lacticidven:4) ) Recent Results (from the past 240 hour(s))  SARS Coronavirus 2 (CEPHEID - Performed in [place] hospital lab), Hosp Order     Status: None   Collection Time: 02/02/19  8:41 PM   Specimen: Nasopharyngeal Swab  Result Value Ref Range Status   SARS Coronavirus 2 NEGATIVE NEGATIVE Final    Comment: (NOTE) If result is NEGATIVE SARS-CoV-2  target nucleic acids are  NOT DETECTED. The SARS-CoV-2 RNA is generally detectable in upper and lower  respiratory specimens during the acute phase of infection. The lowest  concentration of SARS-CoV-2 viral copies this assay can detect is 250  copies / mL. A negative result does not preclude SARS-CoV-2 infection  and should not be used as the sole basis for treatment or other  patient management decisions.  A negative result may occur with  improper specimen collection / handling, submission of specimen other  than nasopharyngeal swab, presence of viral mutation(s) within the  areas targeted by this assay, and inadequate number of viral copies  (<250 copies / mL). A negative result must be combined with clinical  observations, patient history, and epidemiological information. If result is POSITIVE SARS-CoV-2 target nucleic acids are DETECTED. The SARS-CoV-2 RNA is generally detectable in upper and lower  respiratory specimens dur ing the acute phase of infection.  Positive  results are indicative of active infection with SARS-CoV-2.  Clinical  correlation with patient history and other diagnostic information is  necessary to determine patient infection status.  Positive results do  not rule out bacterial infection or co-infection with other viruses. If result is PRESUMPTIVE POSTIVE SARS-CoV-2 nucleic acids MAY BE PRESENT.   A presumptive positive result was obtained on the submitted specimen  and confirmed on repeat testing.  While 2019 novel coronavirus  (SARS-CoV-2) nucleic acids may be present in the submitted sample  additional confirmatory testing may be necessary for epidemiological  and / or clinical management purposes  to differentiate between  SARS-CoV-2 and other Sarbecovirus currently known to infect humans.  If clinically indicated additional testing with an alternate test  methodology [phone]) is advised. The SARS-CoV-2 RNA is generally  detectable in upper and lower  respiratory sp ecimens during the acute  phase of infection. The expected result is Negative. Fact Sheet for Patients:  StrictlyIdeas.no Fact Sheet for Healthcare Providers: BankingDealers.co.za This test is not yet approved or cleared by the [place] FDA and has been authorized for detection and/or diagnosis of SARS-CoV-2 by FDA under an Emergency Use Authorization (EUA).  This EUA will remain in effect (meaning this test can be used) for the duration of the COVID-19 declaration under Section 564(b)(1) of the Act, 21 U.S.C. section 360bbb-3(b)(1), unless the authorization is terminated or revoked sooner. Performed at [facility], [place] [address], [place], [place] [address]      Radiological Exams on Admission: Ct Head Wo Contrast  Result Date: 02/02/2019 CLINICAL DATA:  Head trauma EXAM: CT HEAD WITHOUT CONTRAST CT CERVICAL SPINE WITHOUT CONTRAST TECHNIQUE: Multidetector CT imaging of the head and cervical spine was performed following the standard protocol without intravenous contrast. Multiplanar CT image reconstructions of the cervical spine were also generated. COMPARISON:  02/11/2018, 11/03/2017 FINDINGS: CT HEAD FINDINGS Brain: No evidence of acute infarction, hemorrhage, hydrocephalus, extra-axial collection or mass lesion/mass effect. Periventricular white matter hypodensity and global volume loss. Vascular: No hyperdense vessel or unexpected calcification. Skull: Normal. Negative for fracture or focal lesion. Sinuses/Orbits: No acute finding. Other: None. CT CERVICAL SPINE FINDINGS Examination of the cervical spine is significantly limited by patient motion artifact. Alignment: Normal. Skull base and vertebrae: No acute fracture. No primary bone lesion or focal pathologic process. Soft tissues and spinal canal: No prevertebral fluid or swelling. No visible canal hematoma. Disc levels: Focally severe disc degenerative disease and  osteophytosis at C4-C5 with endplate sclerosis, substantially worsened in comparison to CT dated 11/03/2017. Upper chest: Negative. Other: None. IMPRESSION: 1.  No acute intracranial pathology. Small-vessel white matter disease and global volume loss. 2. Examination of the cervical spine is significantly limited by patient motion artifact. Within this limitation, no obvious fracture or static subluxation of the cervical spine. 3. Focally severe disc degenerative disease and osteophytosis at C4-C5 with endplate sclerosis, substantially worsened in comparison to CT dated 11/03/2017. Electronically Signed   By: Eddie Candle M.D.   On: 02/02/2019 20:08   Ct Cervical Spine Wo Contrast  Result Date: 02/02/2019 CLINICAL DATA:  Head trauma EXAM: CT HEAD WITHOUT CONTRAST CT CERVICAL SPINE WITHOUT CONTRAST TECHNIQUE: Multidetector CT imaging of the head and cervical spine was performed following the standard protocol without intravenous contrast. Multiplanar CT image reconstructions of the cervical spine were also generated. COMPARISON:  02/11/2018, 11/03/2017 FINDINGS: CT HEAD FINDINGS Brain: No evidence of acute infarction, hemorrhage, hydrocephalus, extra-axial collection or mass lesion/mass effect. Periventricular white matter hypodensity and global volume loss. Vascular: No hyperdense vessel or unexpected calcification. Skull: Normal. Negative for fracture or focal lesion. Sinuses/Orbits: No acute finding. Other: None. CT CERVICAL SPINE FINDINGS Examination of the cervical spine is significantly limited by patient motion artifact. Alignment: Normal. Skull base and vertebrae: No acute fracture. No primary bone lesion or focal pathologic process. Soft tissues and spinal canal: No prevertebral fluid or swelling. No visible canal hematoma. Disc levels: Focally severe disc degenerative disease and osteophytosis at C4-C5 with endplate sclerosis, substantially worsened in comparison to CT dated 11/03/2017. Upper chest:  Negative. Other: None. IMPRESSION: 1. No acute intracranial pathology. Small-vessel white matter disease and global volume loss. 2. Examination of the cervical spine is significantly limited by patient motion artifact. Within this limitation, no obvious fracture or static subluxation of the cervical spine. 3. Focally severe disc degenerative disease and osteophytosis at C4-C5 with endplate sclerosis, substantially worsened in comparison to CT dated 11/03/2017. Electronically Signed   By: Eddie Candle M.D.   On: 02/02/2019 20:08   Dg Chest Port 1 View  Result Date: 02/02/2019 CLINICAL DATA:  Cough and congestive heart failure EXAM: PORTABLE CHEST 1 VIEW COMPARISON:  January 15, 2019 FINDINGS: The heart size remains enlarged. Left-sided pacemaker is noted. The lung volumes are somewhat low. Advanced degenerative changes are noted of both glenohumeral joints. There is no pneumothorax. Aortic calcifications are noted. IMPRESSION: Cardiomegaly with mild vascular congestion. Electronically Signed   By: Constance Holster M.D.   On: 02/02/2019 19:23    EKG: Independently reviewed.  No significant change from previous  Assessment/Plan Principal Problem:   Acute renal failure superimposed on stage 3 chronic kidney disease ([place]) Active Problems:   Nonischemic cardiomyopathy - coronaries by angiography 2010   PAH (pulmonary artery hypertension) (HCC)   Chronic atrial fibrillation   Acute on chronic respiratory failure with hypoxia (HCC)   DNR (do not resuscitate)   CHF exacerbation ([place])     #1 acute on chronic kidney failure: BUN/creatinine has increased.  At this point it appears to be due to aggressive diuresis that patient received in the last 2 weeks.  We will hold diuretics.  He is relatively hypotensive.  Gentle fluids and monitor blood pressure.  Monitor renal function.  #2 CHF exacerbation: Patient has evidence of fluid overload.  Most likely secondary to low albumin but also advanced CHF.   Discussion with patient and family once again about goals of care.  If aggressive care is desired patient may only need to get his fluid off by involving nephrology and cardiology.  May require pressors with aggressive diuresis.  #  3 chronic atrial fibrillation: Continue home regimen.  #4 hypotension: Most likely due to overdiuresis.  Gentle fluid to 1.5 L.  Monitor blood pressure.  #5 hyperlipidemia: Continue home statin.  #6 generalized debility: Patient has been on hospice care and now indicating desire to change to full code.  Further discussion needs to be done with patient and family regarding goals of care.   DVT prophylaxis: Heparin Code Status: Full code now but has been DNR Family Communication: Daughter Disposition Plan: To be determined Consults called: None Admission status: Inpatient  Severity of Illness: The appropriate patient status for this patient is INPATIENT. Inpatient status is judged to be reasonable and necessary in order to provide the required intensity of service to ensure the patient's safety. The patient's presenting symptoms, physical exam findings, and initial radiographic and laboratory data in the context of their chronic comorbidities is felt to place them at high risk for further clinical deterioration. Furthermore, it is not anticipated that the patient will be medically stable for discharge from the hospital within 2 midnights of admission. The following factors support the patient status of inpatient.   " The patient's presenting symptoms include increasing swelling. " The worrisome physical exam findings include generalized debility with anasarca. " The initial radiographic and laboratory data are worrisome because of worsening renal function. " The chronic co-morbidities include CHF.   * I certify that at the point of admission it is my clinical judgment that the patient will require inpatient hospital care spanning beyond 2 midnights from the point  of admission due to high intensity of service, high risk for further deterioration and high frequency of surveillance required.Barbette Merino MD [facility] Pager [phone]  If 7PM-7AM, please contact night-coverage [email or web address] Password [facility]  02/02/2019, 11:53 PM

## 2019-02-02 NOTE — Telephone Encounter (Addendum)
Spoke with daughter and she would like [place] so patient can get home IV diuretics. Patient currently has Hospice who started Metolazone 2.5 mg daily on Monday, Aldactone 25 mg daily, and Torsemide 80 mg twice a day. Hospice does not do home IV diuretics. He has scrotal and abdominal swelling, where he tends to retain fluid. Dry weight around 183, now fluctuating between 196-202  Patient is short of breath with very little exertion Per daughter patient wants to get better. Will forward to Dr Harrell Gave DOD for review

## 2019-02-02 NOTE — ED Notes (Signed)
Patient transported to X-ray 

## 2019-02-03 DIAGNOSIS — N179 Acute kidney failure, unspecified: Secondary | ICD-10-CM

## 2019-02-03 DIAGNOSIS — I5023 Acute on chronic systolic (congestive) heart failure: Secondary | ICD-10-CM

## 2019-02-03 LAB — BASIC METABOLIC PANEL
Anion gap: 15 (ref 5–15)
BUN: 152 mg/dL — ABNORMAL HIGH (ref 8–23)
CO2: 28 mmol/L (ref 22–32)
Calcium: 9 mg/dL (ref 8.9–10.3)
Chloride: 91 mmol/L — ABNORMAL LOW (ref 98–111)
Creatinine, Ser: 3.34 mg/dL — ABNORMAL HIGH (ref 0.61–1.24)
GFR calc Af Amer: 18 mL/min — ABNORMAL LOW (ref >60)
GFR calc non Af Amer: 16 mL/min — ABNORMAL LOW (ref >60)
Glucose, Bld: 122 mg/dL — ABNORMAL HIGH (ref 70–99)
Potassium: 4.7 mmol/L (ref 3.5–5.1)
Sodium: 134 mmol/L — ABNORMAL LOW (ref 135–145)

## 2019-02-03 LAB — CBC
HCT: 26.5 % — ABNORMAL LOW (ref 39.0–52.0)
Hemoglobin: 8.5 g/dL — ABNORMAL LOW (ref 13.0–17.0)
MCH: 36.8 pg — ABNORMAL HIGH (ref 26.0–34.0)
MCHC: 32.1 g/dL (ref 30.0–36.0)
MCV: 114.7 fL — ABNORMAL HIGH (ref 80.0–100.0)
Platelets: 98 10*3/uL — ABNORMAL LOW (ref 150–400)
RBC: 2.31 MIL/uL — ABNORMAL LOW (ref 4.22–5.81)
RDW: 19.4 % — ABNORMAL HIGH (ref 11.5–15.5)
WBC: 4.2 10*3/uL (ref 4.0–10.5)
nRBC: 8.4 % — ABNORMAL HIGH (ref 0.0–0.2)

## 2019-02-03 LAB — MRSA PCR SCREENING: MRSA by PCR: POSITIVE — AB

## 2019-02-03 LAB — CREATININE, SERUM
Creatinine, Ser: 3.77 mg/dL — ABNORMAL HIGH (ref 0.61–1.24)
GFR calc Af Amer: 16 mL/min — ABNORMAL LOW (ref >60)
GFR calc non Af Amer: 14 mL/min — ABNORMAL LOW (ref >60)

## 2019-02-03 MED ORDER — ACETAMINOPHEN ER 650 MG PO TBCR: 1300.0000 mg | EXTENDED_RELEASE_TABLET | Freq: Two times a day (BID) | ORAL | Status: DC

## 2019-02-03 MED ORDER — CAMPHOR-MENTHOL 0.5-0.5 % EX LOTN
1.0000 "application " | TOPICAL_LOTION | Freq: Three times a day (TID) | CUTANEOUS | Status: DC | PRN
  Filled 2019-02-03: qty 222

## 2019-02-03 MED ORDER — ONDANSETRON HCL 4 MG/2ML IJ SOLN: 4.0000 mg | Freq: Four times a day (QID) | INTRAMUSCULAR | Status: DC | PRN

## 2019-02-03 MED ORDER — NEPRO/CARBSTEADY PO LIQD: 237.0000 mL | Freq: Three times a day (TID) | ORAL | Status: DC | PRN

## 2019-02-03 MED ORDER — ZOLPIDEM TARTRATE 5 MG PO TABS: 5.0000 mg | ORAL_TABLET | Freq: Every evening | ORAL | Status: DC | PRN

## 2019-02-03 MED ORDER — HEPARIN SODIUM (PORCINE) 5000 UNIT/ML IJ SOLN
5000.0000 [IU] | Freq: Three times a day (TID) | INTRAMUSCULAR | Status: DC
  Administered 2019-02-03 – 2019-02-05 (×7): 5000 [IU] via SUBCUTANEOUS
  Filled 2019-02-03 (×7): qty 1

## 2019-02-03 MED ORDER — DOCUSATE SODIUM 100 MG PO CAPS
100.0000 mg | ORAL_CAPSULE | Freq: Every day | ORAL | Status: DC
  Administered 2019-02-03 – 2019-02-05 (×3): 100 mg via ORAL
  Filled 2019-02-03 (×3): qty 1

## 2019-02-03 MED ORDER — CHLORHEXIDINE GLUCONATE CLOTH 2 % EX PADS
6.0000 | MEDICATED_PAD | Freq: Every day | CUTANEOUS | Status: DC
  Administered 2019-02-03 – 2019-02-05 (×3): 6 via TOPICAL

## 2019-02-03 MED ORDER — MUPIROCIN 2 % EX OINT
1.0000 "application " | TOPICAL_OINTMENT | Freq: Two times a day (BID) | CUTANEOUS | Status: DC
  Administered 2019-02-03 – 2019-02-05 (×5): 1 via NASAL
  Filled 2019-02-03 (×2): qty 22

## 2019-02-03 MED ORDER — ADULT MULTIVITAMIN W/MINERALS CH
1.0000 | ORAL_TABLET | Freq: Every day | ORAL | Status: DC
  Administered 2019-02-03 – 2019-02-05 (×3): 1 via ORAL
  Filled 2019-02-03 (×3): qty 1

## 2019-02-03 MED ORDER — SORBITOL 70 % SOLN: 30.0000 mL | Status: DC | PRN

## 2019-02-03 MED ORDER — PANTOPRAZOLE SODIUM 40 MG PO TBEC
40.0000 mg | DELAYED_RELEASE_TABLET | Freq: Every day | ORAL | Status: DC
  Administered 2019-02-03 – 2019-02-05 (×3): 40 mg via ORAL
  Filled 2019-02-03 (×3): qty 1

## 2019-02-03 MED ORDER — ALBUTEROL SULFATE HFA 108 (90 BASE) MCG/ACT IN AERS: 2.0000 | INHALATION_SPRAY | Freq: Four times a day (QID) | RESPIRATORY_TRACT | Status: DC | PRN

## 2019-02-03 MED ORDER — ACETAMINOPHEN 650 MG RE SUPP: 650.0000 mg | Freq: Four times a day (QID) | RECTAL | Status: DC | PRN

## 2019-02-03 MED ORDER — ROPINIROLE HCL 1 MG PO TABS
2.0000 mg | ORAL_TABLET | Freq: Every day | ORAL | Status: DC
  Administered 2019-02-03 – 2019-02-05 (×3): 2 mg via ORAL
  Filled 2019-02-03 (×3): qty 2

## 2019-02-03 MED ORDER — SPIRONOLACTONE 25 MG PO TABS
25.0000 mg | ORAL_TABLET | Freq: Every day | ORAL | Status: DC
  Administered 2019-02-03: 25 mg via ORAL
  Filled 2019-02-03: qty 1

## 2019-02-03 MED ORDER — DOCUSATE SODIUM 283 MG RE ENEM
1.0000 | ENEMA | RECTAL | Status: DC | PRN
  Filled 2019-02-03: qty 1

## 2019-02-03 MED ORDER — POTASSIUM CHLORIDE CRYS ER 20 MEQ PO TBCR
20.0000 meq | EXTENDED_RELEASE_TABLET | Freq: Two times a day (BID) | ORAL | Status: DC
  Administered 2019-02-03: 20 meq via ORAL
  Filled 2019-02-03: qty 1

## 2019-02-03 MED ORDER — TRAMADOL HCL 50 MG PO TABS
50.0000 mg | ORAL_TABLET | Freq: Four times a day (QID) | ORAL | Status: DC | PRN
  Administered 2019-02-03 – 2019-02-05 (×4): 50 mg via ORAL
  Filled 2019-02-03 (×4): qty 1

## 2019-02-03 MED ORDER — SODIUM CHLORIDE 0.9 % IV SOLN
INTRAVENOUS | Status: DC
  Administered 2019-02-03 (×3): via INTRAVENOUS

## 2019-02-03 MED ORDER — ROPINIROLE HCL 1 MG PO TABS
4.0000 mg | ORAL_TABLET | Freq: Every day | ORAL | Status: DC
  Administered 2019-02-03 – 2019-02-04 (×3): 4 mg via ORAL
  Filled 2019-02-03 (×4): qty 4

## 2019-02-03 MED ORDER — ALBUTEROL SULFATE (2.5 MG/3ML) 0.083% IN NEBU: 2.5000 mg | INHALATION_SOLUTION | Freq: Four times a day (QID) | RESPIRATORY_TRACT | Status: DC | PRN

## 2019-02-03 MED ORDER — ESCITALOPRAM OXALATE 10 MG PO TABS
5.0000 mg | ORAL_TABLET | Freq: Every day | ORAL | Status: DC
  Administered 2019-02-03 – 2019-02-04 (×3): 5 mg via ORAL
  Filled 2019-02-03 (×3): qty 1

## 2019-02-03 MED ORDER — TRIAMCINOLONE ACETONIDE 0.1 % EX CREA
1.0000 "application " | TOPICAL_CREAM | Freq: Two times a day (BID) | CUTANEOUS | Status: DC | PRN
  Administered 2019-02-03 – 2019-02-04 (×3): 1 via TOPICAL
  Filled 2019-02-03: qty 15

## 2019-02-03 MED ORDER — TAMSULOSIN HCL 0.4 MG PO CAPS
0.4000 mg | ORAL_CAPSULE | Freq: Every day | ORAL | Status: DC
  Administered 2019-02-03 – 2019-02-05 (×3): 0.4 mg via ORAL
  Filled 2019-02-03 (×3): qty 1

## 2019-02-03 MED ORDER — PROSIGHT PO TABS
1.0000 | ORAL_TABLET | Freq: Every day | ORAL | Status: DC
  Administered 2019-02-03 – 2019-02-04 (×3): 1 via ORAL
  Filled 2019-02-03 (×3): qty 1

## 2019-02-03 MED ORDER — DOBUTAMINE IN D5W 4-5 MG/ML-% IV SOLN
2.5000 ug/kg/min | INTRAVENOUS | Status: DC
  Administered 2019-02-03: 2.5 ug/kg/min via INTRAVENOUS
  Filled 2019-02-03: qty 250

## 2019-02-03 MED ORDER — MIRTAZAPINE 7.5 MG PO TABS
15.0000 mg | ORAL_TABLET | Freq: Every day | ORAL | Status: DC
  Administered 2019-02-03 – 2019-02-04 (×3): 15 mg via ORAL
  Filled 2019-02-03: qty 2
  Filled 2019-02-03: qty 1
  Filled 2019-02-03: qty 2

## 2019-02-03 MED ORDER — ACETAMINOPHEN 500 MG PO TABS
500.0000 mg | ORAL_TABLET | Freq: Three times a day (TID) | ORAL | Status: DC
  Administered 2019-02-03 – 2019-02-05 (×8): 500 mg via ORAL
  Filled 2019-02-03 (×8): qty 1

## 2019-02-03 MED ORDER — FERROUS SULFATE 325 (65 FE) MG PO TABS
325.0000 mg | ORAL_TABLET | Freq: Two times a day (BID) | ORAL | Status: DC
  Administered 2019-02-03 – 2019-02-05 (×5): 325 mg via ORAL
  Filled 2019-02-03 (×5): qty 1

## 2019-02-03 MED ORDER — HYDROXYZINE HCL 25 MG PO TABS
25.0000 mg | ORAL_TABLET | Freq: Three times a day (TID) | ORAL | Status: DC | PRN
  Filled 2019-02-03: qty 1

## 2019-02-03 MED ORDER — ONDANSETRON HCL 4 MG PO TABS: 4.0000 mg | ORAL_TABLET | Freq: Four times a day (QID) | ORAL | Status: DC | PRN

## 2019-02-03 MED ORDER — CALCIUM CARBONATE ANTACID 1250 MG/5ML PO SUSP
500.0000 mg | Freq: Four times a day (QID) | ORAL | Status: DC | PRN
  Administered 2019-02-04: 500 mg via ORAL
  Filled 2019-02-03 (×3): qty 5

## 2019-02-03 MED ORDER — DIPHENHYDRAMINE-ZINC ACETATE 2-0.1 % EX CREA
1.0000 "application " | TOPICAL_CREAM | Freq: Two times a day (BID) | CUTANEOUS | Status: DC | PRN
  Filled 2019-02-03: qty 28

## 2019-02-03 MED ORDER — ACETAMINOPHEN 325 MG PO TABS: 650.0000 mg | ORAL_TABLET | Freq: Four times a day (QID) | ORAL | Status: DC | PRN

## 2019-02-03 MED ORDER — LORATADINE 10 MG PO TABS
10.0000 mg | ORAL_TABLET | Freq: Every day | ORAL | Status: DC
  Administered 2019-02-03 – 2019-02-05 (×3): 10 mg via ORAL
  Filled 2019-02-03 (×3): qty 1

## 2019-02-03 MED ORDER — HYDROXYZINE HCL 25 MG PO TABS
25.0000 mg | ORAL_TABLET | Freq: Four times a day (QID) | ORAL | Status: DC
  Administered 2019-02-03 – 2019-02-05 (×11): 25 mg via ORAL
  Filled 2019-02-03 (×9): qty 1

## 2019-02-03 MED ORDER — ALLOPURINOL 100 MG PO TABS
200.0000 mg | ORAL_TABLET | Freq: Every day | ORAL | Status: DC
  Administered 2019-02-03 – 2019-02-05 (×3): 200 mg via ORAL
  Filled 2019-02-03 (×3): qty 2

## 2019-02-03 NOTE — Progress Notes (Signed)
Asked by bedside RN to place pt on RR radar list. Pt on dobutamine 2.14mcg to attempt to stabilize renal function enough to diurese pt. VSS currently. HR-73 v-paced, BP-95/57, RR-22, SpO2-94% on 3L [place], though per RN, pt keeps taking O2 off and SpO2-88% when that occurs.. Pt placed on radar list.

## 2019-02-03 NOTE — Progress Notes (Addendum)
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  Patient has been considering stopping hospice services to pursue further treatment for his heart failure.  Due to his increased edema, in spite of adjustments to his diuretics, his symptoms continued to worsen and he decided to go to the hospital.  He has decided to rescind his DNR status.  He is admitted with AKI on CKD and acute HF on chronic HF.  Pt without any complaints of pain.  He does endorse significant scrotal edema.  Discussed with PMT, they will assist with continued [place] discussions to help support the patients desired outcome.   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  Pt has been unrealistic about his disease process, however his family understands.  He has revoked hospice services in the past.  Continued ongoing discussions surrounding EOL needs and disease trajectory. Spoke with [facility] MD, and we can continue with current scope while in the hospital.  Family: Updated Freda Munro, wife  IDT:  Updated  Transfer Summary and Medications List placed in electronic chart  Once pt is ready to discharge, should he need ambulance transport home,pleasee use [facility] as they contract this service for our active hospice pts.  Thank you, Venia Carbon RN, BSN, [facility] Liaison [phone]

## 2019-02-03 NOTE — Plan of Care (Signed)
Patient is on Dobutamine at 2.5 mcgs/kg/min =3.5cc/hr and his B/P is running soft, made sure that [facility] is aware of his condition and that he is on Dobutamine and his EF is 20-25%

## 2019-02-03 NOTE — Progress Notes (Signed)
PROGRESS NOTE                                                                                                                                                                                                             Patient Demographics:    [name], is a 85 y.o. male, DOB - 1931-10-23, EXN:[id number]  Admit date - 02/02/2019   Admitting Physician Elwyn Reach, MD  Outpatient Primary MD for the patient is [name], [name] Him, MD  LOS - 1  Outpatient Specialists: None  Chief Complaint  Patient presents with   Groin Swelling       Brief Narrative 85 year old male with end-stage CHF secondary to an ICM, symptomatic bradycardia status post CRT, COPD with chronic respiratory failure on home O2, OSA, pulmonary hypertension, chronic A. fib and chronic kidney disease stage III.  Patient currently on home hospice was brought to the ED with progressive swelling of the scrotum and generalized weakness.  Was found to be hypertensive in the ED.  Recently seen by his cardiologist to increase his diuretic due to increased leg edema and weight gain.  His torsemide was increased to 80 mg twice daily and was also taking metolazone. He now decided to be full code and wants full scope of treatment.  He was found to have worsened renal function from his baseline (3.77 from baseline around 1.5-2). Patient admitted for acute on chronic systolic CHF with cardiorenal syndrome.   Subjective:   Patient reports feeling very tired and concerned about his scrotal swelling.  Denies any pain.   Assessment  & Plan :    Principal Problem:   Acute renal failure superimposed on stage 3 chronic kidney disease (HCC) Appears to be cardiorenal syndrome.  Diuretics were increased recently as outpatient.  Heart failure team on board and have added dobutamine with plan on attempted diuresis if renal function improves.  Acute on chronic systolic  CHF Has end-stage CHF.  Heart failure team on board and recommend he is not a candidate for advanced cardiac treatment.  Attempting a course of dobutamine to see if his renal function improves and be able to diurese him further.  Holding off on diuretic today.  Overall prognosis is guarded.  Not a candidate for hemodialysis.  Both Dr. Aundra Dubin and I have discussed with him about this separately.  I have discussed with him further on goals of care.  He is okay with discussing with palliative care also.   Discussed with his wife on the phone she understands that patient overall health is not good but wants him to get better.  Also feels that patient is capable of making his own decisions and she has to respect them.  Active symptoms COPD with chronic respiratory failure Continue home O2 and PRN nebs  Chronic A. fib Rate controlled.  Dyslipidemia Continue statin     Code Status : Full code  Family Communication  : Discussed with wife on the phone  Disposition Plan  : Pending hospital course, condition is guarded  Barriers For Discharge : Active symptoms  Consults  : Heart failure  Procedures  : None  DVT Prophylaxis  : Subcu heparin  Lab Results  Component Value Date   PLT 98 (L) 02/03/2019    Antibiotics  : None  Anti-infectives (From admission, onward)   None        Objective:   Vitals:   02/03/19 0403 02/03/19 0943 02/03/19 1225 02/03/19 1353  BP: 97/69 102/77 (!) 87/50 (!) 97/59  Pulse: 70 78 71   Resp: 16 15 17    Temp: 97.7 F (36.5 C) (!) 97.5 F (36.4 C) 97.8 F (36.6 C)   TempSrc: Oral Oral Oral   SpO2: 100% 92% 100%   Weight:      Height:        Wt Readings from Last 3 Encounters:  02/03/19 92.2 kg  01/15/19 89.4 kg  01/09/19 87.1 kg     Intake/Output Summary (Last 24 hours) at 02/03/2019 1444 Last data filed at 02/03/2019 1108 Gross per 24 hour  Intake 820 ml  Output 876 ml  Net -56 ml     Physical Exam  Gen: Early male  fatigue HEENT: moist mucosa, supple neck Chest: Fine bibasilar crackles CVS: N S1&S2, no murmurs,  GI: soft, NT, ND, BS+, scrotal edema Musculoskeletal: warm, trace edema     Data Review:    CBC Recent Labs  Lab 02/02/19 1832 02/03/19 0109  WBC 4.0 4.2  HGB 8.3* 8.5*  HCT 25.8* 26.5*  PLT 108* 98*  MCV 114.2* 114.7*  MCH 36.7* 36.8*  MCHC 32.2 32.1  RDW 19.1* 19.4*  LYMPHSABS 0.3*  --   MONOABS 0.4  --   EOSABS 0.0  --   BASOSABS 0.0  --     Chemistries  Recent Labs  Lab 02/02/19 1832 02/03/19 0109  NA 129*  --   K 4.8  --   CL 88*  --   CO2 29  --   GLUCOSE 118*  --   BUN 150*  --   CREATININE 3.61* 3.77*  CALCIUM 8.8*  --   AST 101*  --   ALT 104*  --   ALKPHOS 95  --   BILITOT 2.7*  --    ------------------------------------------------------------------------------------------------------------------ No results for input(s): CHOL, HDL, LDLCALC, TRIG, CHOLHDL, LDLDIRECT in the last 72 hours.  Lab Results  Component Value Date   HGBA1C 5.2 01/12/2018   ------------------------------------------------------------------------------------------------------------------ No results for input(s): TSH, T4TOTAL, T3FREE, THYROIDAB in the last 72 hours.  Invalid input(s): FREET3 ------------------------------------------------------------------------------------------------------------------ No results for input(s): VITAMINB12, FOLATE, FERRITIN, TIBC, IRON, RETICCTPCT in the last 72 hours.  Coagulation profile No results for input(s): INR, PROTIME in the last 168 hours.  No results for input(s): DDIMER in the last 72 hours.  Cardiac Enzymes No results for input(s): CKMB,  TROPONINI, MYOGLOBIN in the last 168 hours.  Invalid input(s): CK ------------------------------------------------------------------------------------------------------------------    Component Value Date/Time   BNP 2,562.6 (H) 02/02/2019 1832    Inpatient Medications  Scheduled  Meds:  acetaminophen  500 mg Oral TID   allopurinol  200 mg Oral Daily   Chlorhexidine Gluconate Cloth  6 each Topical Q0600   docusate sodium  100 mg Oral Q breakfast   escitalopram  5 mg Oral QHS   ferrous sulfate  325 mg Oral BID WC   heparin  5,000 Units Subcutaneous Q8H   hydrOXYzine  25 mg Oral Q6H   loratadine  10 mg Oral Q breakfast   mirtazapine  15 mg Oral QHS   multivitamin  1 tablet Oral QHS   multivitamin with minerals  1 tablet Oral Daily   mupirocin ointment  1 application Nasal BID   pantoprazole  40 mg Oral Daily   rOPINIRole  2 mg Oral Daily   rOPINIRole  4 mg Oral QHS   tamsulosin  0.4 mg Oral QPC breakfast   Continuous Infusions:  sodium chloride 75 mL/hr at 02/03/19 1240   DOBUTamine     PRN Meds:.albuterol, calcium carbonate (dosed in mg elemental calcium), camphor-menthol **AND** hydrOXYzine, diphenhydrAMINE-zinc acetate, docusate sodium, feeding supplement (NEPRO CARB STEADY), ondansetron **OR** ondansetron (ZOFRAN) IV, sorbitol, traMADol, triamcinolone cream, zolpidem  Micro Results Recent Results (from the past 240 hour(s))  SARS Coronavirus 2 (CEPHEID - Performed in [place] hospital lab), Hosp Order     Status: None   Collection Time: 02/02/19  8:41 PM   Specimen: Nasopharyngeal Swab  Result Value Ref Range Status   SARS Coronavirus 2 NEGATIVE NEGATIVE Final    Comment: (NOTE) If result is NEGATIVE SARS-CoV-2 target nucleic acids are NOT DETECTED. The SARS-CoV-2 RNA is generally detectable in upper and lower  respiratory specimens during the acute phase of infection. The lowest  concentration of SARS-CoV-2 viral copies this assay can detect is 250  copies / mL. A negative result does not preclude SARS-CoV-2 infection  and should not be used as the sole basis for treatment or other  patient management decisions.  A negative result may occur with  improper specimen collection / handling, submission of specimen other  than  nasopharyngeal swab, presence of viral mutation(s) within the  areas targeted by this assay, and inadequate number of viral copies  (<250 copies / mL). A negative result must be combined with clinical  observations, patient history, and epidemiological information. If result is POSITIVE SARS-CoV-2 target nucleic acids are DETECTED. The SARS-CoV-2 RNA is generally detectable in upper and lower  respiratory specimens dur ing the acute phase of infection.  Positive  results are indicative of active infection with SARS-CoV-2.  Clinical  correlation with patient history and other diagnostic information is  necessary to determine patient infection status.  Positive results do  not rule out bacterial infection or co-infection with other viruses. If result is PRESUMPTIVE POSTIVE SARS-CoV-2 nucleic acids MAY BE PRESENT.   A presumptive positive result was obtained on the submitted specimen  and confirmed on repeat testing.  While 2019 novel coronavirus  (SARS-CoV-2) nucleic acids may be present in the submitted sample  additional confirmatory testing may be necessary for epidemiological  and / or clinical management purposes  to differentiate between  SARS-CoV-2 and other Sarbecovirus currently known to infect humans.  If clinically indicated additional testing with an alternate test  methodology [phone]) is advised. The SARS-CoV-2 RNA is generally  detectable in upper  and lower respiratory sp ecimens during the acute  phase of infection. The expected result is Negative. Fact Sheet for Patients:  StrictlyIdeas.no Fact Sheet for Healthcare Providers: BankingDealers.co.za This test is not yet approved or cleared by the [place] FDA and has been authorized for detection and/or diagnosis of SARS-CoV-2 by FDA under an Emergency Use Authorization (EUA).  This EUA will remain in effect (meaning this test can be used) for the duration of  the COVID-19 declaration under Section 564(b)(1) of the Act, 21 U.S.C. section 360bbb-3(b)(1), unless the authorization is terminated or revoked sooner. Performed at [facility], [place] [address] [place], [place] [address]   MRSA PCR Screening     Status: Abnormal   Collection Time: 02/03/19  4:52 AM   Specimen: Nasal Mucosa; Nasopharyngeal  Result Value Ref Range Status   MRSA by PCR POSITIVE (A) NEGATIVE Final    Comment:        The GeneXpert MRSA Assay (FDA approved for NASAL specimens only), is one component of a comprehensive MRSA colonization surveillance program. It is not intended to diagnose MRSA infection nor to guide or monitor treatment for MRSA infections. RESULT CALLED TO, READ BACK BY AND VERIFIED WITH: T.BEMABE RN AT 0740 02/03/2019 BY A.DAVIS Performed at [facility], [place] [address], [place], [place] [address]     Radiology Reports Dg Chest 2 View  Result Date: 01/16/2019 CLINICAL DATA:  Shortness of breath EXAM: CHEST - 2 VIEW COMPARISON:  CT chest 11/04/2016 FINDINGS: Bilateral mild interstitial thickening. No focal consolidation, pleural effusion or pneumothorax. Stable cardiomegaly. Dual lead cardiac pacemaker. Moderate osteoarthritis of bilateral glenohumeral joints. No acute osseous abnormality. IMPRESSION: Cardiomegaly with mild pulmonary vascular congestion. Electronically Signed   By: Kathreen Devoid   On: 01/16/2019 08:21   Ct Head Wo Contrast  Result Date: 02/02/2019 CLINICAL DATA:  Head trauma EXAM: CT HEAD WITHOUT CONTRAST CT CERVICAL SPINE WITHOUT CONTRAST TECHNIQUE: Multidetector CT imaging of the head and cervical spine was performed following the standard protocol without intravenous contrast. Multiplanar CT image reconstructions of the cervical spine were also generated. COMPARISON:  02/11/2018, 11/03/2017 FINDINGS: CT HEAD FINDINGS Brain: No evidence of acute infarction, hemorrhage, hydrocephalus, extra-axial collection or mass  lesion/mass effect. Periventricular white matter hypodensity and global volume loss. Vascular: No hyperdense vessel or unexpected calcification. Skull: Normal. Negative for fracture or focal lesion. Sinuses/Orbits: No acute finding. Other: None. CT CERVICAL SPINE FINDINGS Examination of the cervical spine is significantly limited by patient motion artifact. Alignment: Normal. Skull base and vertebrae: No acute fracture. No primary bone lesion or focal pathologic process. Soft tissues and spinal canal: No prevertebral fluid or swelling. No visible canal hematoma. Disc levels: Focally severe disc degenerative disease and osteophytosis at C4-C5 with endplate sclerosis, substantially worsened in comparison to CT dated 11/03/2017. Upper chest: Negative. Other: None. IMPRESSION: 1. No acute intracranial pathology. Small-vessel white matter disease and global volume loss. 2. Examination of the cervical spine is significantly limited by patient motion artifact. Within this limitation, no obvious fracture or static subluxation of the cervical spine. 3. Focally severe disc degenerative disease and osteophytosis at C4-C5 with endplate sclerosis, substantially worsened in comparison to CT dated 11/03/2017. Electronically Signed   By: Eddie Candle M.D.   On: 02/02/2019 20:08   Ct Cervical Spine Wo Contrast  Result Date: 02/02/2019 CLINICAL DATA:  Head trauma EXAM: CT HEAD WITHOUT CONTRAST CT CERVICAL SPINE WITHOUT CONTRAST TECHNIQUE: Multidetector CT imaging of the head and cervical spine was  performed following the standard protocol without intravenous contrast. Multiplanar CT image reconstructions of the cervical spine were also generated. COMPARISON:  02/11/2018, 11/03/2017 FINDINGS: CT HEAD FINDINGS Brain: No evidence of acute infarction, hemorrhage, hydrocephalus, extra-axial collection or mass lesion/mass effect. Periventricular white matter hypodensity and global volume loss. Vascular: No hyperdense vessel or  unexpected calcification. Skull: Normal. Negative for fracture or focal lesion. Sinuses/Orbits: No acute finding. Other: None. CT CERVICAL SPINE FINDINGS Examination of the cervical spine is significantly limited by patient motion artifact. Alignment: Normal. Skull base and vertebrae: No acute fracture. No primary bone lesion or focal pathologic process. Soft tissues and spinal canal: No prevertebral fluid or swelling. No visible canal hematoma. Disc levels: Focally severe disc degenerative disease and osteophytosis at C4-C5 with endplate sclerosis, substantially worsened in comparison to CT dated 11/03/2017. Upper chest: Negative. Other: None. IMPRESSION: 1. No acute intracranial pathology. Small-vessel white matter disease and global volume loss. 2. Examination of the cervical spine is significantly limited by patient motion artifact. Within this limitation, no obvious fracture or static subluxation of the cervical spine. 3. Focally severe disc degenerative disease and osteophytosis at C4-C5 with endplate sclerosis, substantially worsened in comparison to CT dated 11/03/2017. Electronically Signed   By: Eddie Candle M.D.   On: 02/02/2019 20:08   Dg Chest Port 1 View  Result Date: 02/02/2019 CLINICAL DATA:  Cough and congestive heart failure EXAM: PORTABLE CHEST 1 VIEW COMPARISON:  January 15, 2019 FINDINGS: The heart size remains enlarged. Left-sided pacemaker is noted. The lung volumes are somewhat low. Advanced degenerative changes are noted of both glenohumeral joints. There is no pneumothorax. Aortic calcifications are noted. IMPRESSION: Cardiomegaly with mild vascular congestion. Electronically Signed   By: Constance Holster M.D.   On: 02/02/2019 19:23    Time Spent in minutes 35   Malacai Grantz M.D on 02/03/2019 at 2:44 PM  Between 7am to 7pm - Pager - [phone]  After 7pm go to [email or web address] - password [facility]  [facility] -  Office  [phone]

## 2019-02-03 NOTE — Progress Notes (Addendum)
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 Criger is under the care of hospice at home.  He has revoked his hospice benefit in the past, to pursue treatment outside of the hospice plan of care.  He is again wanting to stop hospice services.  Currently, his plan of care during this hospitalization is within the scope of hospice.     Please call with any questions, Venia Carbon RN, BSN, [facility] Liaison [phone] direct #) [phone] (main #)

## 2019-02-03 NOTE — Consult Note (Signed)
Advanced Heart Failure Team Consult Note   Primary Physician: [name], [name] Him, MD PCP-Cardiologist:  [name], MD  Reason for Consultation: End stage CHF  HPI:    Anthony LEMMERMAN Sr. is seen today for evaluation of CHF at the request of Dr. Darrick Meigs.   Anthony Agar Sr. is a 85 y.o. male with a hx of PMH of CKD stage III, chronic afib, HTN, HLD, MDS, NICM, symptomatic bradycardia s/p St Jude CRT-P, COPD on home O2, OSA, RLS and pulmonary HTN  Patient has a long-standing history of NICM. EF 40-45% in 2010. Cath at that time showed normal coronaries.  S/p Single chamber PPM 10/2011 -> CRT-P upgrade 03/2018. Echoes in 2016 and 2018 with normal EF.   Admitted 09/2017 with Acute CHF. Echo showed EF down to 25-30%, mild MR, peak PA pressure 111 mmHg.   Admitted again 10/2017 for symptomatic anemia requiring transfusion. EGD showed angiodysplastic lesion in the gastric antrum and duodenum. Eliquis discontinued 01/2018 with concerns for high risk bleeding.   [name] presented to [facility]/27/18 with progressive weakness and SOB. Echo showed EF 25-30% with severe LV dilation.  He was in cardiogenic shock, was on dual pressors initially with milrinone and norepinephrine, then weaned off pressors and sent home with hospice.   He has been at home on hospice it appears since that time.  Earlier this month, he was sent back to cardiology clinic because of weight gain and increased edema.  Torsemide was eventually increased to 80 mg bid and he was taking metolazone, listed as daily on home meds.  He had ongoing weight gain, scrotal edema, and profound fatigue.  Therefore, he decided to come to the ER for management.  Currently, has decided to be full code at least while in the hospital.  He was noted to have BUN 150 with creatinine up to 3.77 today (baseline creatinine around 1.8).  SBP 90s-100s.  Diuretics were held with AKI.   Review of Systems: All systems reviewed and negative except as per  HPI.   Home Medications Prior to Admission medications   Medication Sig Start Date End Date Taking? Authorizing Provider  acetaminophen (TYLENOL) 650 MG CR tablet Take 1,300 mg by mouth 2 (two) times a day.    Yes [provider]  allopurinol (ZYLOPRIM) 100 MG tablet Take 2 tablets (200 mg total) by mouth daily. 05/29/18  Yes Medina-Vargas, Monina C, NP  Dextran 70-Hypromellose (ARTIFICIAL TEARS PF OP) Place 2 drops into both eyes 3 (three) times daily as needed (for dryness).   Yes [provider]  diphenhydrAMINE-zinc acetate (BENADRYL) cream Apply topically 2 (two) times daily as needed for itching. Patient taking differently: Apply 1 application topically 2 (two) times daily as needed for itching (apply to affected sites).  05/11/18  Yes Alma Friendly, MD  docusate sodium (COLACE) 100 MG capsule Take 100 mg by mouth daily with breakfast.   Yes [provider]  escitalopram (LEXAPRO) 5 MG tablet Take 1 tablet (5 mg total) by mouth at bedtime. 05/29/18  Yes Medina-Vargas, Monina C, NP  ferrous sulfate 325 (65 FE) MG tablet Take 325 mg by mouth 2 (two) times daily with a meal.   Yes [provider]  hydrOXYzine (ATARAX/VISTARIL) 25 MG tablet TAKE 1 TABLET BY MOUTH EVERY 6 HOURS AS NEEDED FOR ITCHING Patient taking differently: Take 25 mg by mouth every 6 (six) hours.  01/26/19  Yes Croitoru, Mihai, MD  ipratropium-albuterol (DUONEB) 0.5-2.5 (3) MG/3ML SOLN  Take 3 mLs by nebulization every 4 (four) hours as needed. Patient taking differently: Take 3 mLs by nebulization every 4 (four) hours as needed (for wheezing or shortness of breath).  05/29/18  Yes Medina-Vargas, Monina C, NP  loratadine (CLARITIN) 10 MG tablet Take 10 mg by mouth daily with breakfast.    Yes [provider]  metolazone (ZAROXOLYN) 2.5 MG tablet Take 2.5 mg by mouth daily.   Yes [provider]  mirtazapine (REMERON) 15 MG tablet Take 1 tablet (15 mg total) by mouth at  bedtime. 05/29/18  Yes Medina-Vargas, Monina C, NP  Multiple Vitamin (MULTIVITAMIN WITH MINERALS) TABS tablet Take 1 tablet by mouth daily.   Yes [provider]  Multiple Vitamins-Minerals (PRESERVISION AREDS 2) CAPS Take 1 capsule by mouth at bedtime.    Yes [provider]  OXYGEN Inhale 4 L into the lungs continuous.    Yes [provider]  pantoprazole (PROTONIX) 40 MG tablet Take 1 tablet (40 mg total) by mouth daily. 05/29/18  Yes Medina-Vargas, Monina C, NP  potassium chloride (K-DUR,KLOR-CON) 20 MEQ tablet Take 1 tablet (20 mEq total) by mouth 2 (two) times daily. 07/09/18  Yes Patrecia Pour, MD  rOPINIRole (REQUIP) 4 MG tablet Take 1 tablet (4 mg total) by mouth at bedtime. Patient taking differently: Take 2-4 mg by mouth See admin instructions. Take 2 mg by mouth in the morning and 4 mg at bedtime 05/29/18  Yes Medina-Vargas, Monina C, NP  spironolactone (ALDACTONE) 25 MG tablet Take 1 tablet (25 mg total) by mouth daily. 08/17/18  Yes Croitoru, Mihai, MD  tamsulosin (FLOMAX) 0.4 MG CAPS capsule Take 1 capsule (0.4 mg total) by mouth daily after breakfast. 05/29/18  Yes Medina-Vargas, Monina C, NP  torsemide (DEMADEX) 20 MG tablet Take 4 tablets ( 80 mg ) twice a day 01/16/19  Yes Croitoru, Mihai, MD  traMADol (ULTRAM) 50 MG tablet Take 50 mg by mouth every 6 (six) hours as needed (for pain).  01/26/19  Yes [provider]  triamcinolone cream (KENALOG) 0.1 % Apply 1 application topically 2 (two) times daily. Patient taking differently: Apply 1 application topically 2 (two) times daily as needed (for itching, to affected sites).  05/29/18  Yes Medina-Vargas, Monina C, NP  VENTOLIN HFA 108 (90 Base) MCG/ACT inhaler Inhale 2 puffs into the lungs every 6 (six) hours as needed for wheezing or shortness of breath.  06/20/18  Yes [provider]  dutasteride (AVODART) 0.5 MG capsule Take 1 capsule (0.5 mg total) by mouth daily. Patient not taking: Reported  on 02/02/2019 05/29/18   Nickola Major, NP    Past Medical History: Past Medical History:  Diagnosis Date   Arthritis    "feet" (09/30/2017)   CHF (congestive heart failure) (HCC)    Chronic bronchitis (HCC)    CKD (chronic kidney disease), stage III ([place])    [name] 09/30/2017   Coronary artery disease    Diverticulitis    Dyspnea    GERD (gastroesophageal reflux disease)    Gout    History of blood transfusion    "blood loss" (09/30/2017)   Hyperlipidemia    Hypertension    MDS (myelodysplastic syndrome), high grade ([place]) 05/11/2018   MI (myocardial infarction) ([place]) ~ 2000   "light one"   Nonischemic cardiomyopathy ([place])    mild   OSA on CPAP    Pacemaker single chamber, [place], 2013 10/13/2011   Permanent atrial fibrillation    on Eliquis  Pulmonary hypertension ([place])    Restless legs     Past Surgical History: Past Surgical History:  Procedure Laterality Date   APPENDECTOMY  12/2000   [name] 12/22/2010   BIV UPGRADE N/A 02/22/2018   Procedure: BIV UPGRADE;  Surgeon: [name], MD;  Location: [place] CV LAB;  Service: Cardiovascular;  Laterality: N/A;   BIV UPGRADE N/A 03/29/2018   Procedure: BIV PPM UPGRADE;  Surgeon: [name], MD;  Location: [place] CV LAB;  Service: Cardiovascular;  Laterality: N/A;   CARDIAC CATHETERIZATION  11/07/2008   nonischemic cardiomyopathy,pulmonary hypertension   CATARACT EXTRACTION W/ INTRAOCULAR LENS  IMPLANT, BILATERAL Bilateral    CIRCUMCISION  12/2005   [name] 12/22/2010   COLOSTOMY  12/2000   [name] 12/22/2010   COLOSTOMY REVERSAL  07/2001   [name] 12/22/2010   CORONARY ANGIOPLASTY  06/01/1999   successful to ostium of the first diagonal   ESOPHAGOGASTRODUODENOSCOPY N/A 08/22/2013   Procedure: ESOPHAGOGASTRODUODENOSCOPY (EGD);  Surgeon: [name], MD;  Location[facility] ENDOSCOPY;  Service: Endoscopy;  Laterality: N/A;  h/p in file cabinet, jackie    ESOPHAGOGASTRODUODENOSCOPY N/A 11/05/2014   Procedure: ESOPHAGOGASTRODUODENOSCOPY (EGD);  Surgeon: Clarene Essex, MD;  Location: [facility] ENDOSCOPY;  Service: Endoscopy;  Laterality: N/A;   ESOPHAGOGASTRODUODENOSCOPY N/A 11/08/2016   Procedure: ESOPHAGOGASTRODUODENOSCOPY (EGD);  Surgeon: [name], MD;  Location: [facility] ENDOSCOPY;  Service: Endoscopy;  Laterality: N/A;   ESOPHAGOGASTRODUODENOSCOPY (EGD) WITH PROPOFOL Left 11/05/2017   Procedure: ESOPHAGOGASTRODUODENOSCOPY (EGD) WITH PROPOFOL;  Surgeon: [name], MD;  Location: [place];  Service: Endoscopy;  Laterality: Left;   HOT HEMOSTASIS N/A 11/05/2014   Procedure: HOT HEMOSTASIS (ARGON PLASMA COAGULATION/BICAP);  Surgeon: Clarene Essex, MD;  Location: [facility] ENDOSCOPY;  Service: Endoscopy;  Laterality: N/A;   INSERT / REPLACE / REMOVE PACEMAKER     JOINT REPLACEMENT     MASS EXCISION Left    hand w/ulnar artery reconstruction/notes 12/22/2010   NM MYOCAR PERF WALL MOTION  11/24/2007   normal   PERMANENT PACEMAKER INSERTION  10/04/2012   [employer]   PERMANENT PACEMAKER INSERTION N/A 10/12/2011   Procedure: PERMANENT PACEMAKER INSERTION;  Surgeon: [name], MD;  Location: [place] CATH LAB;  Service: Cardiovascular;  Laterality: N/A;   REPLACEMENT TOTAL KNEE Bilateral 11/2006   right-left/notes 12/22/2010   ROTATOR CUFF REPAIR Right 09/2004   [name] 12/22/2010   SAVORY DILATION N/A 08/22/2013   Procedure: SAVORY DILATION;  Surgeon: [name], MD;  Location: [facility] ENDOSCOPY;  Service: Endoscopy;  Laterality: N/A;   SHOULDER SURGERY Right    "fell off house; messed up 3 things in my arm"   TONSILLECTOMY     US ECHOCARDIOGRAPHY  02/01/2011   LA is mod-severely dilated,AOV & root sclerotic,ca+ AOV leaflets    Family History: Family History  Problem Relation Age of Onset   Cancer Mother    Heart attack Father    Diabetes Brother     Social History: Social History   Socioeconomic History   Marital status: Married     Spouse name: Not on file   Number of children: Not on file   Years of education: Not on file   Highest education level: Not on file  Occupational History   Occupation: retired  Scientist, product/process development strain: Not on file   Food insecurity    Worry: Not on file    Inability: Not on file   Transportation needs    Medical: Not on file    Non-medical: Not on  file  Tobacco Use   Smoking status: Former Smoker   Smokeless tobacco: Never Used   Tobacco comment: 09/30/2017 "it's been over 57yr since I smoked anything"  Substance and Sexual Activity   Alcohol use: No   Drug use: No   Sexual activity: Not Currently  Lifestyle   Physical activity    Days per week: Not on file    Minutes per session: Not on file   Stress: Not on file  Relationships   Social connections    Talks on phone: Not on file    Gets together: Not on file    Attends religious service: Not on file    Active member of club or organization: Not on file    Attends meetings of clubs or organizations: Not on file    Relationship status: Not on file  Other Topics Concern   Not on file  Social History Narrative   Not on file    Allergies:  Allergies  Allergen Reactions   Vicodin [Hydrocodone-Acetaminophen] Itching and Nausea Only   Eliquis [Apixaban] Other (See Comments)    "Caused bleeding issues"    Objective:    Vital Signs:   Temp:  [97.4 F (36.3 C)-98.1 F (36.7 C)] 97.8 F (36.6 C) (06/27 1225) Pulse Rate:  [69-78] 71 (06/27 1225) Resp:  [14-18] 17 (06/27 1225) BP: (86-102)/(50-77) 87/50 (06/27 1225) SpO2:  [92 %-100 %] 100 % (06/27 1225) Weight:  [92.2 kg] 92.2 kg (06/27 0056) Last BM Date: 02/03/19  Weight change: Filed Weights   02/03/19 0056  Weight: 92.2 kg    Intake/Output:   Intake/Output Summary (Last 24 hours) at 02/03/2019 1347 Last data filed at 02/03/2019 1108 Gross per 24 hour  Intake 820 ml  Output 876 ml  Net -56 ml      Physical Exam      General:  Chronically ill-appearing HEENT: normal Neck: supple. JVP 14-16 cm. Carotids 2+ bilat; no bruits. No lymphadenopathy or thyromegaly appreciated. Cor: PMI lateral. Regular rate & rhythm. No rubs, gallops or murmurs. Lungs: clear Abdomen: soft, nontender, nondistended. No hepatosplenomegaly. No bruits or masses. Good bowel sounds. Extremities: no cyanosis, clubbing, rash, edema.  There is scotal edema, mild.  Neuro: alert & orientedx3, cranial nerves grossly intact. moves all 4 extremities w/o difficulty. Affect pleasant   Telemetry   Atrial fibrillation with BiV pacing (personally reviewed)  EKG    Atrial fibrillation with BiV pacing at 70 (personally reviewed)  Labs   Basic Metabolic Panel: Recent Labs  Lab 02/02/19 1832 02/03/19 0109  NA 129*  --   K 4.8  --   CL 88*  --   CO2 29  --   GLUCOSE 118*  --   BUN 150*  --   CREATININE 3.61* 3.77*  CALCIUM 8.8*  --     Liver Function Tests: Recent Labs  Lab 02/02/19 1832  AST 101*  ALT 104*  ALKPHOS 95  BILITOT 2.7*  PROT 6.4*  ALBUMIN 3.3*   No results for input(s): LIPASE, AMYLASE in the last 168 hours. No results for input(s): AMMONIA in the last 168 hours.  CBC: Recent Labs  Lab 02/02/19 1832 02/03/19 0109  WBC 4.0 4.2  NEUTROABS 3.3  --   HGB 8.3* 8.5*  HCT 25.8* 26.5*  MCV 114.2* 114.7*  PLT 108* 98*    Cardiac Enzymes: No results for input(s): CKTOTAL, CKMB, CKMBINDEX, TROPONINI in the last 168 hours.  BNP: BNP (last 3 results) Recent Labs  01/09/19 1631 01/15/19 1440 02/02/19 1832  BNP 1,654.9* 2,258.0* 2,562.6*    ProBNP (last 3 results) No results for input(s): PROBNP in the last 8760 hours.   CBG: No results for input(s): GLUCAP in the last 168 hours.  Coagulation Studies: No results for input(s): LABPROT, INR in the last 72 hours.   Imaging   Ct Head Wo Contrast  Result Date: 02/02/2019 CLINICAL DATA:  Head trauma EXAM: CT HEAD WITHOUT CONTRAST CT  CERVICAL SPINE WITHOUT CONTRAST TECHNIQUE: Multidetector CT imaging of the head and cervical spine was performed following the standard protocol without intravenous contrast. Multiplanar CT image reconstructions of the cervical spine were also generated. COMPARISON:  02/11/2018, 11/03/2017 FINDINGS: CT HEAD FINDINGS Brain: No evidence of acute infarction, hemorrhage, hydrocephalus, extra-axial collection or mass lesion/mass effect. Periventricular white matter hypodensity and global volume loss. Vascular: No hyperdense vessel or unexpected calcification. Skull: Normal. Negative for fracture or focal lesion. Sinuses/Orbits: No acute finding. Other: None. CT CERVICAL SPINE FINDINGS Examination of the cervical spine is significantly limited by patient motion artifact. Alignment: Normal. Skull base and vertebrae: No acute fracture. No primary bone lesion or focal pathologic process. Soft tissues and spinal canal: No prevertebral fluid or swelling. No visible canal hematoma. Disc levels: Focally severe disc degenerative disease and osteophytosis at C4-C5 with endplate sclerosis, substantially worsened in comparison to CT dated 11/03/2017. Upper chest: Negative. Other: None. IMPRESSION: 1. No acute intracranial pathology. Small-vessel white matter disease and global volume loss. 2. Examination of the cervical spine is significantly limited by patient motion artifact. Within this limitation, no obvious fracture or static subluxation of the cervical spine. 3. Focally severe disc degenerative disease and osteophytosis at C4-C5 with endplate sclerosis, substantially worsened in comparison to CT dated 11/03/2017. Electronically Signed   By: Eddie Candle M.D.   On: 02/02/2019 20:08   Ct Cervical Spine Wo Contrast  Result Date: 02/02/2019 CLINICAL DATA:  Head trauma EXAM: CT HEAD WITHOUT CONTRAST CT CERVICAL SPINE WITHOUT CONTRAST TECHNIQUE: Multidetector CT imaging of the head and cervical spine was performed following the  standard protocol without intravenous contrast. Multiplanar CT image reconstructions of the cervical spine were also generated. COMPARISON:  02/11/2018, 11/03/2017 FINDINGS: CT HEAD FINDINGS Brain: No evidence of acute infarction, hemorrhage, hydrocephalus, extra-axial collection or mass lesion/mass effect. Periventricular white matter hypodensity and global volume loss. Vascular: No hyperdense vessel or unexpected calcification. Skull: Normal. Negative for fracture or focal lesion. Sinuses/Orbits: No acute finding. Other: None. CT CERVICAL SPINE FINDINGS Examination of the cervical spine is significantly limited by patient motion artifact. Alignment: Normal. Skull base and vertebrae: No acute fracture. No primary bone lesion or focal pathologic process. Soft tissues and spinal canal: No prevertebral fluid or swelling. No visible canal hematoma. Disc levels: Focally severe disc degenerative disease and osteophytosis at C4-C5 with endplate sclerosis, substantially worsened in comparison to CT dated 11/03/2017. Upper chest: Negative. Other: None. IMPRESSION: 1. No acute intracranial pathology. Small-vessel white matter disease and global volume loss. 2. Examination of the cervical spine is significantly limited by patient motion artifact. Within this limitation, no obvious fracture or static subluxation of the cervical spine. 3. Focally severe disc degenerative disease and osteophytosis at C4-C5 with endplate sclerosis, substantially worsened in comparison to CT dated 11/03/2017. Electronically Signed   By: Eddie Candle M.D.   On: 02/02/2019 20:08   Dg Chest Port 1 View  Result Date: 02/02/2019 CLINICAL DATA:  Cough and congestive heart failure EXAM: PORTABLE CHEST 1 VIEW COMPARISON:  January 15, 2019  FINDINGS: The heart size remains enlarged. Left-sided pacemaker is noted. The lung volumes are somewhat low. Advanced degenerative changes are noted of both glenohumeral joints. There is no pneumothorax. Aortic  calcifications are noted. IMPRESSION: Cardiomegaly with mild vascular congestion. Electronically Signed   By: Constance Holster M.D.   On: 02/02/2019 19:23      Medications:     Current Medications:  acetaminophen  500 mg Oral TID   allopurinol  200 mg Oral Daily   Chlorhexidine Gluconate Cloth  6 each Topical Q0600   docusate sodium  100 mg Oral Q breakfast   escitalopram  5 mg Oral QHS   ferrous sulfate  325 mg Oral BID WC   heparin  5,000 Units Subcutaneous Q8H   hydrOXYzine  25 mg Oral Q6H   loratadine  10 mg Oral Q breakfast   mirtazapine  15 mg Oral QHS   multivitamin  1 tablet Oral QHS   multivitamin with minerals  1 tablet Oral Daily   mupirocin ointment  1 application Nasal BID   pantoprazole  40 mg Oral Daily   rOPINIRole  2 mg Oral Daily   rOPINIRole  4 mg Oral QHS   tamsulosin  0.4 mg Oral QPC breakfast     Infusions:  sodium chloride 75 mL/hr at 02/03/19 1240   DOBUTamine        Assessment/Plan   1. Acute on chronic systolic CHF: History of end stage nonischemic cardiomyopathy, low output HF.  Most recent echo in 11/19 with severely dilated LV, EF 25-30%.  He has been under hospice care, but with worsening edema and fatigue decided to come to the ER.  St Jude CRT-P, he has been BiV pacing appropriately. He has been found to have AKI on CKD 3 with markedly elevated BUN and creatinine to 3.77.  Diuretics held at admission, minimal UOP.  He is volume overloaded on exam.  Patient is not a candidate for hemodialysis.  He is not a candidate for advanced cardiac therapies.  I had a discussion with him today about his clinical course.  I do not think that he has good options at this time to turn around his course.  I told him that we could try to get him back home with hospice versus attempt a time-limited course of dobutamine in the hospital to see if it will allow renal function to stabilize and allow [place] to diurese him some.  He wants to try  dobutamine.  - Start dobutamine 2.5 mcg/kg/min.  Would try to avoid PICC line and run peripherally as would like this to be temporary (he could not go home with hospice on dobutamine).  - No diuretics today, but if renal function stabilizes/improves, will try to start IV Lasix tomorrow.  2. Atrial fibrillation: Chronic. He has not been anticoagulated due to AVM bleeding and MDS with anemia/thrombocytopenia.  3. AKI on CKD stage 3: Creatinine higher today at 3.77, BUN was 150 yesterday.  Cardiorenal syndrome in setting of attempting aggressive diuresis as an outpatient.  - Will add dobutamine 2.5 to see if this stabilizes renal function with increased CO.   - Will attempt diuresis tomorrow if renal function stabilizes.  4. Myelodysplastic syndrome: Pancytopenia, plts 98K with hgb 8.5.  - Can use heparin for DVT prophylaxis.  5. COPD: On home oxygen at baseline.  6. OSA: CPAP at night.  He is currently full code.  We discussed this today and I told him that risk for cardiac arrest occurring will be  higher if we put him on dobutamine.  If he has an arrest, he would be unlikely to survive, and worst case scenario he could end up on a ventilator and need family to make decision about care withdrawal.  I spoke with his wife also and she is going to talk to him further about ?switching back to DNR.  Ultimately, I think that resumption of hospice care is going to be his best option but for now will try dobutamine according to his wishes.  Need to involve palliative care service.   Length of Stay: 1  Loralie Champagne, MD  02/03/2019, 1:47 PM  Advanced Heart Failure Team Pager [phone] (M-F; 7a - 4p)  Please contact [facility] for night-coverage after hours (4p -7a ) and weekends on [email or web address]

## 2019-02-03 NOTE — Plan of Care (Signed)
  Problem: Education: Goal: Ability to demonstrate management of disease process will improve Outcome: Progressing Goal: Ability to verbalize understanding of medication therapies will improve Outcome: Progressing Goal: Individualized Educational Video(s) Outcome: Not Met (add Reason)   Problem: Activity: Goal: Capacity to carry out activities will improve Outcome: Not Met (add Reason)   Problem: Cardiac: Goal: Ability to achieve and maintain adequate cardiopulmonary perfusion will improve Outcome: Progressing   Problem: Education: Goal: Knowledge of General Education information will improve Description: Including pain rating scale, medication(s)/side effects and non-pharmacologic comfort measures Outcome: Progressing   Problem: Health Behavior/Discharge Planning: Goal: Ability to manage health-related needs will improve Outcome: Progressing   Problem: Clinical Measurements: Goal: Ability to maintain clinical measurements within normal limits will improve Outcome: Progressing Goal: Will remain free from infection Outcome: Progressing Goal: Diagnostic test results will improve Outcome: Progressing Goal: Respiratory complications will improve Outcome: Progressing Goal: Cardiovascular complication will be avoided Outcome: Progressing   Problem: Activity: Goal: Risk for activity intolerance will decrease Outcome: Not Met (add Reason)   Problem: Nutrition: Goal: Adequate nutrition will be maintained Outcome: Progressing   Problem: Coping: Goal: Level of anxiety will decrease Outcome: Not Met (add Reason)   Problem: Elimination: Goal: Will not experience complications related to bowel motility Outcome: Progressing Goal: Will not experience complications related to urinary retention Outcome: Progressing   Problem: Pain Managment: Goal: General experience of comfort will improve Outcome: Progressing   Problem: Safety: Goal: Ability to remain free from injury will  improve Outcome: Progressing   Problem: Skin Integrity: Goal: Risk for impaired skin integrity will decrease Outcome: Not Met (add Reason)

## 2019-02-04 DIAGNOSIS — I130 Hypertensive heart and chronic kidney disease with heart failure and stage 1 through stage 4 chronic kidney disease, or unspecified chronic kidney disease: Principal | ICD-10-CM

## 2019-02-04 DIAGNOSIS — D469 Myelodysplastic syndrome, unspecified: Secondary | ICD-10-CM

## 2019-02-04 DIAGNOSIS — Z515 Encounter for palliative care: Secondary | ICD-10-CM

## 2019-02-04 LAB — CBC WITH DIFFERENTIAL/PLATELET
Abs Immature Granulocytes: 0.01 10*3/uL (ref 0.00–0.07)
Basophils Absolute: 0 10*3/uL (ref 0.0–0.1)
Basophils Relative: 0 %
Eosinophils Absolute: 0.1 10*3/uL (ref 0.0–0.5)
Eosinophils Relative: 2 %
HCT: 24.6 % — ABNORMAL LOW (ref 39.0–52.0)
Hemoglobin: 7.8 g/dL — ABNORMAL LOW (ref 13.0–17.0)
Immature Granulocytes: 0 %
Lymphocytes Relative: 8 %
Lymphs Abs: 0.3 10*3/uL — ABNORMAL LOW (ref 0.7–4.0)
MCH: 36.6 pg — ABNORMAL HIGH (ref 26.0–34.0)
MCHC: 31.7 g/dL (ref 30.0–36.0)
MCV: 115.5 fL — ABNORMAL HIGH (ref 80.0–100.0)
Monocytes Absolute: 0.5 10*3/uL (ref 0.1–1.0)
Monocytes Relative: 14 %
Neutro Abs: 2.6 10*3/uL (ref 1.7–7.7)
Neutrophils Relative %: 76 %
Platelets: 82 10*3/uL — ABNORMAL LOW (ref 150–400)
RBC: 2.13 MIL/uL — ABNORMAL LOW (ref 4.22–5.81)
RDW: 19.6 % — ABNORMAL HIGH (ref 11.5–15.5)
WBC: 3.4 10*3/uL — ABNORMAL LOW (ref 4.0–10.5)
nRBC: 5.3 % — ABNORMAL HIGH (ref 0.0–0.2)

## 2019-02-04 LAB — BASIC METABOLIC PANEL
Anion gap: 13 (ref 5–15)
BUN: 152 mg/dL — ABNORMAL HIGH (ref 8–23)
CO2: 25 mmol/L (ref 22–32)
Calcium: 8.5 mg/dL — ABNORMAL LOW (ref 8.9–10.3)
Chloride: 94 mmol/L — ABNORMAL LOW (ref 98–111)
Creatinine, Ser: 3.55 mg/dL — ABNORMAL HIGH (ref 0.61–1.24)
GFR calc Af Amer: 17 mL/min — ABNORMAL LOW (ref >60)
GFR calc non Af Amer: 15 mL/min — ABNORMAL LOW (ref >60)
Glucose, Bld: 104 mg/dL — ABNORMAL HIGH (ref 70–99)
Potassium: 4.4 mmol/L (ref 3.5–5.1)
Sodium: 132 mmol/L — ABNORMAL LOW (ref 135–145)

## 2019-02-04 MED ORDER — FUROSEMIDE 10 MG/ML IJ SOLN
160.0000 mg | Freq: Two times a day (BID) | INTRAVENOUS | Status: DC
  Administered 2019-02-04 – 2019-02-05 (×3): 160 mg via INTRAVENOUS
  Filled 2019-02-04: qty 10
  Filled 2019-02-04 (×3): qty 16

## 2019-02-04 NOTE — Progress Notes (Signed)
PROGRESS NOTE                                                                                                                                                                                                             Patient Demographics:    [name], is a 85 y.o. male, DOB - Feb 10, 1932, OFH:[id number]  Admit date - 02/02/2019   Admitting Physician Elwyn Reach, MD  Outpatient Primary MD for the patient is [name], [name] Him, MD  LOS - 2  Outpatient Specialists: None  Chief Complaint  Patient presents with   Groin Swelling       Brief Narrative 85 year old male with end-stage CHF secondary to an ICM, symptomatic bradycardia status post CRT, COPD with chronic respiratory failure on home O2, OSA, pulmonary hypertension, chronic A. fib and chronic kidney disease stage III.  Patient currently on home hospice was brought to the ED with progressive swelling of the scrotum and generalized weakness.  Was found to be hypertensive in the ED.  Recently seen by his cardiologist to increase his diuretic due to increased leg edema and weight gain.  His torsemide was increased to 80 mg twice daily and was also taking metolazone. He now decided to be full code and wants full scope of treatment.  He was found to have worsened renal function from his baseline (3.77 from baseline around 1.5-2). Patient admitted for acute on chronic systolic CHF with cardiorenal syndrome.   Subjective:   Patient complained of chest pain this morning.  Was given some tramadol.  Subsequently resolved.  Still wants full scope of treatment.  Renal function continues to be poor.   Assessment  & Plan :    Principal Problem:   Acute renal failure superimposed on stage 3 chronic kidney disease (HCC) Appears to be cardiorenal syndrome.  Diuretics were increased recently as outpatient.  Heart failure team on board.  Added IV dobutamine.  Started on IV  Lasix 160 mg twice daily to see for response.  Acute on chronic systolic CHF Has end-stage CHF.  Patient not a candidate for advanced cardiac treatment.  Attempting a course of dobutamine to see if his renal function improves and be able to diurese him further with Lasix added today.  Urine output of 625 cc.  Overall prognosis is guarded.  Not a candidate for hemodialysis.  Both Dr. Aundra Dubin and I have been discussing with him every day.   Discussed with his wife on the phone she understands that patient overall health is not good but wants him to get better.  Also feels that patient is capable of making his own decisions and she has to respect them.  Palliative care consulted for goals of care discussion.  Patient still wished for full scope of treatment.  They will be closely involved and strongly recommend considering DNR and hospice.  Active symptoms COPD with chronic respiratory failure Continue home O2 and PRN nebs  Chronic A. fib Rate controlled.  Dyslipidemia Continue statin  Myelodysplastic syndrome Has low platelet and hemoglobin.  Subcu heparin continued.  OSA Continue nighttime CPAP.    Code Status : Full code  Family Communication  : We will update wife.  Disposition Plan  : Pending hospital course, condition is guarded  Barriers For Discharge : Active symptoms  Consults  : Heart failure  Procedures  : None  DVT Prophylaxis  : Subcu heparin  Lab Results  Component Value Date   PLT 82 (L) 02/04/2019    Antibiotics  : None  Anti-infectives (From admission, onward)   None        Objective:   Vitals:   02/04/19 0605 02/04/19 0620 02/04/19 0816 02/04/19 1300  BP: (!) 101/56 (!) 94/54 (!) 102/54 102/66  Pulse: 72 76 71 73  Resp: 18 19    Temp:    97.6 F (36.4 C)  TempSrc:    Axillary  SpO2: 99% 100% 98% 95%  Weight:      Height:        Wt Readings from Last 3 Encounters:  02/03/19 92.2 kg  01/15/19 89.4 kg  01/09/19 87.1 kg      Intake/Output Summary (Last 24 hours) at 02/04/2019 1549 Last data filed at 02/04/2019 1508 Gross per 24 hour  Intake 1978.61 ml  Output 800 ml  Net 1178.61 ml    Physical exam Elderly male lying in bed, fatigued HEENT: Less mucosa, supple neck Chest: Fine bibasilar crackles CVs: Normal S1-S2, no murmurs GI: Soft, nondistended, nontender, scrotal edema + Musculoskeletal: Warm, trace edema      Data Review:    CBC Recent Labs  Lab 02/02/19 1832 02/03/19 0109 02/04/19 0519  WBC 4.0 4.2 3.4*  HGB 8.3* 8.5* 7.8*  HCT 25.8* 26.5* 24.6*  PLT 108* 98* 82*  MCV 114.2* 114.7* 115.5*  MCH 36.7* 36.8* 36.6*  MCHC 32.2 32.1 31.7  RDW 19.1* 19.4* 19.6*  LYMPHSABS 0.3*  --  0.3*  MONOABS 0.4  --  0.5  EOSABS 0.0  --  0.1  BASOSABS 0.0  --  0.0    Chemistries  Recent Labs  Lab 02/02/19 1832 02/03/19 0109 02/03/19 1408 02/04/19 0519  NA 129*  --  134* 132*  K 4.8  --  4.7 4.4  CL 88*  --  91* 94*  CO2 29  --  28 25  GLUCOSE 118*  --  122* 104*  BUN 150*  --  152* 152*  CREATININE 3.61* 3.77* 3.34* 3.55*  CALCIUM 8.8*  --  9.0 8.5*  AST 101*  --   --   --   ALT 104*  --   --   --   ALKPHOS 95  --   --   --   BILITOT 2.7*  --   --   --    ------------------------------------------------------------------------------------------------------------------ No results for input(s): CHOL, HDL, LDLCALC,  TRIG, CHOLHDL, LDLDIRECT in the last 72 hours.  Lab Results  Component Value Date   HGBA1C 5.2 01/12/2018   ------------------------------------------------------------------------------------------------------------------ No results for input(s): TSH, T4TOTAL, T3FREE, THYROIDAB in the last 72 hours.  Invalid input(s): FREET3 ------------------------------------------------------------------------------------------------------------------ No results for input(s): VITAMINB12, FOLATE, FERRITIN, TIBC, IRON, RETICCTPCT in the last 72 hours.  Coagulation profile No  results for input(s): INR, PROTIME in the last 168 hours.  No results for input(s): DDIMER in the last 72 hours.  Cardiac Enzymes No results for input(s): CKMB, TROPONINI, MYOGLOBIN in the last 168 hours.  Invalid input(s): CK ------------------------------------------------------------------------------------------------------------------    Component Value Date/Time   BNP 2,562.6 (H) 02/02/2019 1832    Inpatient Medications  Scheduled Meds:  acetaminophen  500 mg Oral TID   allopurinol  200 mg Oral Daily   Chlorhexidine Gluconate Cloth  6 each Topical Q0600   docusate sodium  100 mg Oral Q breakfast   escitalopram  5 mg Oral QHS   ferrous sulfate  325 mg Oral BID WC   heparin  5,000 Units Subcutaneous Q8H   hydrOXYzine  25 mg Oral Q6H   loratadine  10 mg Oral Q breakfast   mirtazapine  15 mg Oral QHS   multivitamin  1 tablet Oral QHS   multivitamin with minerals  1 tablet Oral Daily   mupirocin ointment  1 application Nasal BID   pantoprazole  40 mg Oral Daily   rOPINIRole  2 mg Oral Daily   rOPINIRole  4 mg Oral QHS   tamsulosin  0.4 mg Oral QPC breakfast   Continuous Infusions:  sodium chloride Stopped (02/04/19 0004)   DOBUTamine 2.5 mcg/kg/min (02/04/19 0600)   furosemide Stopped (02/04/19 1300)   PRN Meds:.albuterol, calcium carbonate (dosed in mg elemental calcium), camphor-menthol **AND** hydrOXYzine, diphenhydrAMINE-zinc acetate, docusate sodium, feeding supplement (NEPRO CARB STEADY), ondansetron **OR** ondansetron (ZOFRAN) IV, sorbitol, traMADol, triamcinolone cream, zolpidem  Micro Results Recent Results (from the past 240 hour(s))  SARS Coronavirus 2 (CEPHEID - Performed in [place] hospital lab), Hosp Order     Status: None   Collection Time: 02/02/19  8:41 PM   Specimen: Nasopharyngeal Swab  Result Value Ref Range Status   SARS Coronavirus 2 NEGATIVE NEGATIVE Final    Comment: (NOTE) If result is NEGATIVE SARS-CoV-2 target  nucleic acids are NOT DETECTED. The SARS-CoV-2 RNA is generally detectable in upper and lower  respiratory specimens during the acute phase of infection. The lowest  concentration of SARS-CoV-2 viral copies this assay can detect is 250  copies / mL. A negative result does not preclude SARS-CoV-2 infection  and should not be used as the sole basis for treatment or other  patient management decisions.  A negative result may occur with  improper specimen collection / handling, submission of specimen other  than nasopharyngeal swab, presence of viral mutation(s) within the  areas targeted by this assay, and inadequate number of viral copies  (<250 copies / mL). A negative result must be combined with clinical  observations, patient history, and epidemiological information. If result is POSITIVE SARS-CoV-2 target nucleic acids are DETECTED. The SARS-CoV-2 RNA is generally detectable in upper and lower  respiratory specimens dur ing the acute phase of infection.  Positive  results are indicative of active infection with SARS-CoV-2.  Clinical  correlation with patient history and other diagnostic information is  necessary to determine patient infection status.  Positive results do  not rule out bacterial infection or co-infection with other viruses. If result is PRESUMPTIVE  POSTIVE SARS-CoV-2 nucleic acids MAY BE PRESENT.   A presumptive positive result was obtained on the submitted specimen  and confirmed on repeat testing.  While 2019 novel coronavirus  (SARS-CoV-2) nucleic acids may be present in the submitted sample  additional confirmatory testing may be necessary for epidemiological  and / or clinical management purposes  to differentiate between  SARS-CoV-2 and other Sarbecovirus currently known to infect humans.  If clinically indicated additional testing with an alternate test  methodology [phone]) is advised. The SARS-CoV-2 RNA is generally  detectable in upper and lower  respiratory sp ecimens during the acute  phase of infection. The expected result is Negative. Fact Sheet for Patients:  StrictlyIdeas.no Fact Sheet for Healthcare Providers: BankingDealers.co.za This test is not yet approved or cleared by the [place] FDA and has been authorized for detection and/or diagnosis of SARS-CoV-2 by FDA under an Emergency Use Authorization (EUA).  This EUA will remain in effect (meaning this test can be used) for the duration of the COVID-19 declaration under Section 564(b)(1) of the Act, 21 U.S.C. section 360bbb-3(b)(1), unless the authorization is terminated or revoked sooner. Performed at [facility], [place] [address], [place], [place] [address]   MRSA PCR Screening     Status: Abnormal   Collection Time: 02/03/19  4:52 AM   Specimen: Nasal Mucosa; Nasopharyngeal  Result Value Ref Range Status   MRSA by PCR POSITIVE (A) NEGATIVE Final    Comment:        The GeneXpert MRSA Assay (FDA approved for NASAL specimens only), is one component of a comprehensive MRSA colonization surveillance program. It is not intended to diagnose MRSA infection nor to guide or monitor treatment for MRSA infections. RESULT CALLED TO, READ BACK BY AND VERIFIED WITH: T.BEMABE RN AT 0740 02/03/2019 BY A.DAVIS Performed at [facility], [place] [address], [place],  [address]     Radiology Reports Dg Chest 2 View  Result Date: 01/16/2019 CLINICAL DATA:  Shortness of breath EXAM: CHEST - 2 VIEW COMPARISON:  CT chest 11/04/2016 FINDINGS: Bilateral mild interstitial thickening. No focal consolidation, pleural effusion or pneumothorax. Stable cardiomegaly. Dual lead cardiac pacemaker. Moderate osteoarthritis of bilateral glenohumeral joints. No acute osseous abnormality. IMPRESSION: Cardiomegaly with mild pulmonary vascular congestion. Electronically Signed   By: Kathreen Devoid   On: 01/16/2019 08:21   Ct Head Wo  Contrast  Result Date: 02/02/2019 CLINICAL DATA:  Head trauma EXAM: CT HEAD WITHOUT CONTRAST CT CERVICAL SPINE WITHOUT CONTRAST TECHNIQUE: Multidetector CT imaging of the head and cervical spine was performed following the standard protocol without intravenous contrast. Multiplanar CT image reconstructions of the cervical spine were also generated. COMPARISON:  02/11/2018, 11/03/2017 FINDINGS: CT HEAD FINDINGS Brain: No evidence of acute infarction, hemorrhage, hydrocephalus, extra-axial collection or mass lesion/mass effect. Periventricular white matter hypodensity and global volume loss. Vascular: No hyperdense vessel or unexpected calcification. Skull: Normal. Negative for fracture or focal lesion. Sinuses/Orbits: No acute finding. Other: None. CT CERVICAL SPINE FINDINGS Examination of the cervical spine is significantly limited by patient motion artifact. Alignment: Normal. Skull base and vertebrae: No acute fracture. No primary bone lesion or focal pathologic process. Soft tissues and spinal canal: No prevertebral fluid or swelling. No visible canal hematoma. Disc levels: Focally severe disc degenerative disease and osteophytosis at C4-C5 with endplate sclerosis, substantially worsened in comparison to CT dated 11/03/2017. Upper chest: Negative. Other: None. IMPRESSION: 1. No acute intracranial pathology. Small-vessel white matter disease and global volume loss. 2. Examination of  the cervical spine is significantly limited by patient motion artifact. Within this limitation, no obvious fracture or static subluxation of the cervical spine. 3. Focally severe disc degenerative disease and osteophytosis at C4-C5 with endplate sclerosis, substantially worsened in comparison to CT dated 11/03/2017. Electronically Signed   By: Eddie Candle M.D.   On: 02/02/2019 20:08   Ct Cervical Spine Wo Contrast  Result Date: 02/02/2019 CLINICAL DATA:  Head trauma EXAM: CT HEAD WITHOUT CONTRAST CT CERVICAL SPINE WITHOUT  CONTRAST TECHNIQUE: Multidetector CT imaging of the head and cervical spine was performed following the standard protocol without intravenous contrast. Multiplanar CT image reconstructions of the cervical spine were also generated. COMPARISON:  02/11/2018, 11/03/2017 FINDINGS: CT HEAD FINDINGS Brain: No evidence of acute infarction, hemorrhage, hydrocephalus, extra-axial collection or mass lesion/mass effect. Periventricular white matter hypodensity and global volume loss. Vascular: No hyperdense vessel or unexpected calcification. Skull: Normal. Negative for fracture or focal lesion. Sinuses/Orbits: No acute finding. Other: None. CT CERVICAL SPINE FINDINGS Examination of the cervical spine is significantly limited by patient motion artifact. Alignment: Normal. Skull base and vertebrae: No acute fracture. No primary bone lesion or focal pathologic process. Soft tissues and spinal canal: No prevertebral fluid or swelling. No visible canal hematoma. Disc levels: Focally severe disc degenerative disease and osteophytosis at C4-C5 with endplate sclerosis, substantially worsened in comparison to CT dated 11/03/2017. Upper chest: Negative. Other: None. IMPRESSION: 1. No acute intracranial pathology. Small-vessel white matter disease and global volume loss. 2. Examination of the cervical spine is significantly limited by patient motion artifact. Within this limitation, no obvious fracture or static subluxation of the cervical spine. 3. Focally severe disc degenerative disease and osteophytosis at C4-C5 with endplate sclerosis, substantially worsened in comparison to CT dated 11/03/2017. Electronically Signed   By: Eddie Candle M.D.   On: 02/02/2019 20:08   Dg Chest Port 1 View  Result Date: 02/02/2019 CLINICAL DATA:  Cough and congestive heart failure EXAM: PORTABLE CHEST 1 VIEW COMPARISON:  January 15, 2019 FINDINGS: The heart size remains enlarged. Left-sided pacemaker is noted. The lung volumes are somewhat low.  Advanced degenerative changes are noted of both glenohumeral joints. There is no pneumothorax. Aortic calcifications are noted. IMPRESSION: Cardiomegaly with mild vascular congestion. Electronically Signed   By: Constance Holster M.D.   On: 02/02/2019 19:23    Time Spent in minutes 35   Myrel Rappleye M.D on 02/04/2019 at 3:49 PM  Between 7am to 7pm - Pager - [phone]  After 7pm go to [email or web address] - password [facility]  [facility] -  Office  [phone]

## 2019-02-04 NOTE — Progress Notes (Signed)
Pt states his chest still hurts but feels much better.Resting in bed.  Idolina Primer, RN

## 2019-02-04 NOTE — Progress Notes (Addendum)
Patient ID: Anthony Agar Sr., male   DOB: 03/11/1932, 85 y.o.   MRN: [id number]     Advanced Heart Failure Rounding Note  PCP-Cardiologist: [name], MD   Subjective:    Patient was started on dobutamine at 2.5 on 6/27.  Renal function fairly stable this morning with BUN 152, creatinine 3.55.  He feels "rough" but not short of breath at rest.  Mildly lethargic. SBP 90s-100s.  UOP 675.    Objective:   Weight Range: 92.2 kg Body mass index is 34.9 kg/m.   Vital Signs:   Temp:  [97.6 F (36.4 C)-98.6 F (37 C)] 97.6 F (36.4 C) (06/28 0356) Pulse Rate:  [41-80] 71 (06/28 0816) Resp:  [9-32] 19 (06/28 0620) BP: (85-111)/(34-81) 102/54 (06/28 0816) SpO2:  [86 %-100 %] 98 % (06/28 0816) FiO2 (%):  [3 %] 3 % (06/27 1616) Last BM Date: 02/03/19  Weight change: Filed Weights   02/03/19 0056  Weight: 92.2 kg    Intake/Output:   Intake/Output Summary (Last 24 hours) at 02/04/2019 0955 Last data filed at 02/04/2019 0954 Gross per 24 hour  Intake 1960.83 ml  Output 800 ml  Net 1160.83 ml      Physical Exam    General:  Well appearing. No resp difficulty HEENT: Normal Neck: Supple. JVP 14+ cm with HJR. Carotids 2+ bilat; no bruits. No lymphadenopathy or thyromegaly appreciated. Cor: PMI lateral. Regular rate & rhythm. No rubs, gallops or murmurs. Lungs: Decreased BS at bases.  Abdomen: Soft, nontender, nondistended. No hepatosplenomegaly. No bruits or masses. Good bowel sounds. Extremities: No cyanosis, clubbing, rash, edema Neuro: lethargic but answers questions appropriately, cranial nerves grossly intact. moves all 4 extremities w/o difficulty. Affect pleasant   Telemetry   Atrial fibrillation with BiV pacing (personally reviewed)   Labs    CBC Recent Labs    02/02/19 1832 02/03/19 0109 02/04/19 0519  WBC 4.0 4.2 3.4*  NEUTROABS 3.3  --  2.6  HGB 8.3* 8.5* 7.8*  HCT 25.8* 26.5* 24.6*  MCV 114.2* 114.7* 115.5*  PLT 108* 98* 82*   Basic Metabolic  Panel Recent Labs    02/03/19 1408 02/04/19 0519  NA 134* 132*  K 4.7 4.4  CL 91* 94*  CO2 28 25  GLUCOSE 122* 104*  BUN 152* 152*  CREATININE 3.34* 3.55*  CALCIUM 9.0 8.5*   Liver Function Tests Recent Labs    02/02/19 1832  AST 101*  ALT 104*  ALKPHOS 95  BILITOT 2.7*  PROT 6.4*  ALBUMIN 3.3*   No results for input(s): LIPASE, AMYLASE in the last 72 hours. Cardiac Enzymes No results for input(s): CKTOTAL, CKMB, CKMBINDEX, TROPONINI in the last 72 hours.  BNP: BNP (last 3 results) Recent Labs    01/09/19 1631 01/15/19 1440 02/02/19 1832  BNP 1,654.9* 2,258.0* 2,562.6*    ProBNP (last 3 results) No results for input(s): PROBNP in the last 8760 hours.   D-Dimer No results for input(s): DDIMER in the last 72 hours. Hemoglobin A1C No results for input(s): HGBA1C in the last 72 hours. Fasting Lipid Panel No results for input(s): CHOL, HDL, LDLCALC, TRIG, CHOLHDL, LDLDIRECT in the last 72 hours. Thyroid Function Tests No results for input(s): TSH, T4TOTAL, T3FREE, THYROIDAB in the last 72 hours.  Invalid input(s): FREET3  Other results:   Imaging     No results found.   Medications:     Scheduled Medications: . acetaminophen  500 mg Oral TID  . allopurinol  200 mg Oral Daily  .  Chlorhexidine Gluconate Cloth  6 each Topical Q0600  . docusate sodium  100 mg Oral Q breakfast  . escitalopram  5 mg Oral QHS  . ferrous sulfate  325 mg Oral BID WC  . heparin  5,000 Units Subcutaneous Q8H  . hydrOXYzine  25 mg Oral Q6H  . loratadine  10 mg Oral Q breakfast  . mirtazapine  15 mg Oral QHS  . multivitamin  1 tablet Oral QHS  . multivitamin with minerals  1 tablet Oral Daily  . mupirocin ointment  1 application Nasal BID  . pantoprazole  40 mg Oral Daily  . rOPINIRole  2 mg Oral Daily  . rOPINIRole  4 mg Oral QHS  . tamsulosin  0.4 mg Oral QPC breakfast     Infusions: . sodium chloride Stopped (02/04/19 0004)  . DOBUTamine 2.5 mcg/kg/min  (02/04/19 0600)     PRN Medications:  albuterol, calcium carbonate (dosed in mg elemental calcium), camphor-menthol **AND** hydrOXYzine, diphenhydrAMINE-zinc acetate, docusate sodium, feeding supplement (NEPRO CARB STEADY), ondansetron **OR** ondansetron (ZOFRAN) IV, sorbitol, traMADol, triamcinolone cream, zolpidem   Assessment/Plan   1. Acute on chronic systolic CHF: History of end stage nonischemic cardiomyopathy, low output HF.  Most recent echo in 11/19 with severely dilated LV, EF 25-30%.  He has been under hospice care, but with worsening edema and fatigue decided to come to the ER.  St Jude CRT-P, he has been BiV pacing appropriately. He has been found to have AKI on CKD 3 with markedly elevated BUN and creatinine to 3.77 at admission.  Diuretics held at admission, poor UOP.  He is volume overloaded on exam.  Patient is not a candidate for hemodialysis.  He is not a candidate for advanced cardiac therapies.  I have talked with him extensively about his clinical course.  I do not think that he has good options at this time to turn around his course.  I told him that we could try to get him back home with hospice versus attempt a time-limited course of dobutamine in the hospital to see if it will allow renal function to stabilize and allow [place] to diurese him some.  He wanted to try dobutamine.  He does not feel better on dobutamine, renal function fairly stable today.  - Continue dobutamine at 2.5. Would try to avoid PICC line and run peripherally as would like this to be temporary (he could not go home with hospice on dobutamine).  - Lasix 160 mg IV bid today, follow response.  2. Atrial fibrillation: Chronic. He has not been anticoagulated due to AVM bleedingand MDS with anemia/thrombocytopenia. 3. AKI on CKD stage 3: BUN 152 today with creatinine 3.55, stable.  Cardiorenal syndrome in setting of attempting aggressive diuresis as an outpatient. He is mildly lethargic today, suspect he has  some signs of uremia.  - Not HD candidate.  - Continue dobutamine and attempt diuresis today with IV Lasix.  4. Myelodysplastic syndrome: Pancytopenia, plts 82K with hgb 7.8.  - Can use heparin for DVT prophylaxis.  5. COPD: On home oxygen at baseline.  6. OSA: CPAP at night.  He is currently full code.  I talked to him again about this.  He is afraid of dying.  I talked to his wife again, she is quite realistic about the situation and understands.  However, will go with patient's wishes and continue dobutamine + Lasix today.  Will need ongoing discussions with palliative care service to address his fears and concerns.    Length  of Stay: 2  Loralie Champagne, MD  02/04/2019, 9:55 AM  Advanced Heart Failure Team Pager [phone] (M-F; 7a - 4p)  Please contact [facility] for night-coverage after hours (4p -7a ) and weekends on [email or web address]

## 2019-02-04 NOTE — Progress Notes (Signed)
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  Patient has been considering stopping hospice services to pursue further treatment for his heart failure.  Due to his increased edema, in spite of adjustments to his diuretics, his symptoms continued to worsen and he decided to go to the hospital.  He has decided to rescind his DNR status.  He is admitted with AKI on CKD and acute HF on chronic HF.  Pt with generalized complaints of being uncomfortable.  He had an episode of "severe" chest pain, 12 lead was abnormal, v paced.  Pt was given tramadol 50 mg PO x 1, with eventual relief.  He reaffirms his code status as full.  Heart Failure has spoken with him and informed him of the risks of continuing the dobutamine and starting lasix, he understands and wants to proceed.  He states he is scared to die.  PMT attempted to continue [place] conversation, but he was in a lot of discomfort and not willing to engage much.  Wife is aware of his prognosis, but she wants to support his wishes for further therapy.    V/S:  97.6 oral, 102/54, 71 v paced, RR 19, SPO2 98% 4 lpm [place], no new weight I&O:  1600/925 Abnormal Labs:  Na 132, Cl 94, BUN 152, Cr 3.55, gfr 15, WBC 3.4, RBC 2.13, hgb 7.8, hct 24.6, platelets 82 Continuous IVs:  NS at 75 ml/hr (stopped at [place]), dobutamine 2.5 mcg/kg/min, lasix 160 mg/50 ml IV q 12  Problem List: AKI on CKD, cardiorenal syndrome- cr increased to 3.55, I&O 1600/925, dobutamine 2.5 mcg/kg/min HF exacerbation-MAP >60, lasix 160 mg BID  Discharge Planning:  Ongoing.  Prior to coming in to the ED, his family had called requesting home health orders, pt them came to ED.    IDT:  Updated  NOK:  Called, left message for return call  Thank you, Venia Carbon RN, BSN, Man (in [place]) [phone]

## 2019-02-04 NOTE — Progress Notes (Signed)
Pt c/o chest pain. Pressure pain in mid chest. EKG done.  Paged Dr. Clementeen Graham and Dr. Aundra Dubin.  Idolina Primer, RN

## 2019-02-04 NOTE — Consult Note (Addendum)
Consultation Note Date: 02/04/2019   Patient Name: [name].  DOB: [birth year]  MRN: [id number]  Age / Sex: 85 y.o., male  PCP: [name], [name] Him, MD Referring Physician: Louellen Molder, MD  Reason for Consultation: Establishing goals of care and Psychosocial/spiritual support  HPI/Patient Profile: 85 y.o. male  with past medical history of end-stage CHF with an ejection fraction of 25 to 30% per echo 2019, hypertension, MDS (followed by Dr. Marin Olp), OSA intermittently on CPAP admitted on 02/02/2019 with acute kidney injury as well as scrotal edema.  Patient's BNP on admission was 2562.  His baseline creatinine on 01/09/2019 was 1.81.  As of 02/04/2019 creatinine now 3.55, GFR 17.  He is currently on a dobutamine drip.  Felt to have progressive disease now with cardiorenal syndrome.  Patient is currently a client of hospice and palliative care of [facility]. He has not revoked his services and this is a covered admission.  Patient has been seen multiple times in 2019 by multiple palliative medicine providers.  Consult ordered again for goals of care.   Clinical Assessment and Goals of Care: Patient seen, chart reviewed.  Discussed with hospice and palliative care of [facility] liaison patient's current status with [facility] and was informed that this is a covered admission, that he has not revoked (however he has revoked hospice benefit several times in the past) .  Patient has struggled with longstanding heart failure, debility and subsequent goals of care with ongoing decline.  He states what brought him to the hospital was severe scrotal swelling.  Upon admission it was noted that he had acute kidney injury with a creatinine of 3.77.  He is currently on a dobutamine drip for renal support.  I walked into the room, patient was complaining of severe chest pain, was very restless and anxious.  RN was at  the bedside performing EKG.  Impression of EKG was "abnormal EKG ".  He states he was given a tramadol for pain but this is not been effective.  He also reports that he has been coughing all night.  He confirms that he is a full code but no further goals of care were addressed and that he was in distress.  Per chart review, physician spoke to patient's spouse on 02/03/2019 and she reports the patient is decisional and she will support what ever his decisions are.  He does seem to be able to participate in goals of care conversation. His healthcare proxy where he unable to speak for himself would be his spouse, Kazuto Sevey at [phone]; per previous palliative medicine note review, patient's daughter, Franco Nones is closely involved in her father's care.  She can be reached at [phone]  Addendum 1400: Went back to check on patient.  Chest pain markedly better.  We did talk in depth about pathophysiology related to congestive heart failure as well as now associated renal impairment.  Patient candidly shares with me that he has been back-and-forth about his goals because he is afraid to die.  I  did ask him if he is  afraid of what happens before death or after death.  He stated he feels as though he is saved and is not verbalizing any existential pain.  He is worried more about shortness of breath and unmanaged symptoms.  We talked through the alternatives to manage symptoms and how ongoing affiliation with hospice would be important to maintain that comfort and alleviate his fear.  I also talked directly about CODE STATUS.  I defined full code versus DNR.  I recommended strongly that he consider DNR status and emphasized that this does not mean do not treat.  I did share with him my concern that those aggressive measures could lead to life support and more importantly spending what could be EOL in an ICU  away from his family;  being with his family is the most important thing, in terms of quality of  life, for him at this point.  SUMMARY OF RECOMMENDATIONS   Full code full scope of treatment Now with new cardiorenal,  patient needs ongoing goals of care and support.  Palliative medicine to stay involved and help support patient as well as [facility] regarding further GOC in light of worsening CHF now with aki Recommended to patient that he consider DNR and emphasize that this does not mean do not treat Code Status/Advance Care Planning:  Full code   Palliative Prophylaxis:   Aspiration, Bowel Regimen, Delirium Protocol, Eye Care, Frequent Pain Assessment, Oral Care and Turn Reposition  Additional Recommendations (Limitations, Scope, Preferences):  Full Scope Treatment  Psycho-social/Spiritual:   Desire for further Chaplaincy support:no  Additional Recommendations: Referral to Community Resources   Prognosis:   < 6 months in the setting of congestive heart failure with EF 25 to 30%, now with cardiorenal syndrome, creatinine 3.55.  Patient is symptomatic with chest pain    Discharge Planning: To Be Determined      Primary Diagnoses: Present on Admission: . PAH (pulmonary artery hypertension) ([place]) . Chronic atrial fibrillation . Acute on chronic respiratory failure with hypoxia ([place]) . DNR (do not resuscitate) . Acute renal failure superimposed on stage 3 chronic kidney disease ([place])   I have reviewed the medical record, interviewed the patient and family, and examined the patient. The following aspects are pertinent.  Past Medical History:  Diagnosis Date  . Arthritis    "feet" (09/30/2017)  . CHF (congestive heart failure) ([place])   . Chronic bronchitis ([place])   . CKD (chronic kidney disease), stage III ([place])    [name] 09/30/2017  . Coronary artery disease   . Diverticulitis   . Dyspnea   . GERD (gastroesophageal reflux disease)   . Gout   . History of blood transfusion    "blood loss" (09/30/2017)  . Hyperlipidemia   . Hypertension   . MDS (myelodysplastic  syndrome), high grade ([place]) 05/11/2018  . MI (myocardial infarction) ([place]) ~ 2000   "light one"  . Nonischemic cardiomyopathy ([facility])    mild  . OSA on CPAP   . Pacemaker single chamber, [place], 2013 10/13/2011  . Permanent atrial fibrillation    on Eliquis  . Pulmonary hypertension ([place])   . Restless legs    Social History   Socioeconomic History  . Marital status: Married    Spouse name: Not on file  . Number of children: Not on file  . Years of education: Not on file  . Highest education level: Not on file  Occupational History  . Occupation: retired  Science writer  Needs  . Financial resource strain: Not on file  . Food insecurity    Worry: Not on file    Inability: Not on file  . Transportation needs    Medical: Not on file    Non-medical: Not on file  Tobacco Use  . Smoking status: Former Research scientist (life sciences)  . Smokeless tobacco: Never Used  . Tobacco comment: 09/30/2017 "it's been over 59yr since I smoked anything"  Substance and Sexual Activity  . Alcohol use: No  . Drug use: No  . Sexual activity: Not Currently  Lifestyle  . Physical activity    Days per week: Not on file    Minutes per session: Not on file  . Stress: Not on file  Relationships  . Social Herbalist on phone: Not on file    Gets together: Not on file    Attends religious service: Not on file    Active member of club or organization: Not on file    Attends meetings of clubs or organizations: Not on file    Relationship status: Not on file  Other Topics Concern  . Not on file  Social History Narrative  . Not on file   Family History  Problem Relation Age of Onset  . Cancer Mother   . Heart attack Father   . Diabetes Brother    Scheduled Meds: . acetaminophen  500 mg Oral TID  . allopurinol  200 mg Oral Daily  . Chlorhexidine Gluconate Cloth  6 each Topical Q0600  . docusate sodium  100 mg Oral Q breakfast  . escitalopram  5 mg Oral QHS  . ferrous sulfate  325 mg Oral BID WC   . heparin  5,000 Units Subcutaneous Q8H  . hydrOXYzine  25 mg Oral Q6H  . loratadine  10 mg Oral Q breakfast  . mirtazapine  15 mg Oral QHS  . multivitamin  1 tablet Oral QHS  . multivitamin with minerals  1 tablet Oral Daily  . mupirocin ointment  1 application Nasal BID  . pantoprazole  40 mg Oral Daily  . rOPINIRole  2 mg Oral Daily  . rOPINIRole  4 mg Oral QHS  . tamsulosin  0.4 mg Oral QPC breakfast   Continuous Infusions: . sodium chloride Stopped (02/04/19 0004)  . DOBUTamine 2.5 mcg/kg/min (02/04/19 0600)  . furosemide     PRN Meds:.albuterol, calcium carbonate (dosed in mg elemental calcium), camphor-menthol **AND** hydrOXYzine, diphenhydrAMINE-zinc acetate, docusate sodium, feeding supplement (NEPRO CARB STEADY), ondansetron **OR** ondansetron (ZOFRAN) IV, sorbitol, traMADol, triamcinolone cream, zolpidem Medications Prior to Admission:  Prior to Admission medications   Medication Sig Start Date End Date Taking? Authorizing Provider  acetaminophen (TYLENOL) 650 MG CR tablet Take 1,300 mg by mouth 2 (two) times a day.    Yes [provider]  allopurinol (ZYLOPRIM) 100 MG tablet Take 2 tablets (200 mg total) by mouth daily. 05/29/18  Yes Medina-Vargas, Monina C, NP  Dextran 70-Hypromellose (ARTIFICIAL TEARS PF OP) Place 2 drops into both eyes 3 (three) times daily as needed (for dryness).   Yes [provider]  diphenhydrAMINE-zinc acetate (BENADRYL) cream Apply topically 2 (two) times daily as needed for itching. Patient taking differently: Apply 1 application topically 2 (two) times daily as needed for itching (apply to affected sites).  05/11/18  Yes Alma Friendly, MD  docusate sodium (COLACE) 100 MG capsule Take 100 mg by mouth daily with breakfast.   Yes [provider]  escitalopram (LEXAPRO) 5 MG tablet  Take 1 tablet (5 mg total) by mouth at bedtime. 05/29/18  Yes Medina-Vargas, Monina C, NP  ferrous sulfate 325 (65 FE) MG tablet Take 325  mg by mouth 2 (two) times daily with a meal.   Yes [provider]  hydrOXYzine (ATARAX/VISTARIL) 25 MG tablet TAKE 1 TABLET BY MOUTH EVERY 6 HOURS AS NEEDED FOR ITCHING Patient taking differently: Take 25 mg by mouth every 6 (six) hours.  01/26/19  Yes Croitoru, Mihai, MD  ipratropium-albuterol (DUONEB) 0.5-2.5 (3) MG/3ML SOLN Take 3 mLs by nebulization every 4 (four) hours as needed. Patient taking differently: Take 3 mLs by nebulization every 4 (four) hours as needed (for wheezing or shortness of breath).  05/29/18  Yes Medina-Vargas, Monina C, NP  loratadine (CLARITIN) 10 MG tablet Take 10 mg by mouth daily with breakfast.    Yes [provider]  metolazone (ZAROXOLYN) 2.5 MG tablet Take 2.5 mg by mouth daily.   Yes [provider]  mirtazapine (REMERON) 15 MG tablet Take 1 tablet (15 mg total) by mouth at bedtime. 05/29/18  Yes Medina-Vargas, Monina C, NP  Multiple Vitamin (MULTIVITAMIN WITH MINERALS) TABS tablet Take 1 tablet by mouth daily.   Yes [provider]  Multiple Vitamins-Minerals (PRESERVISION AREDS 2) CAPS Take 1 capsule by mouth at bedtime.    Yes [provider]  OXYGEN Inhale 4 L into the lungs continuous.    Yes [provider]  pantoprazole (PROTONIX) 40 MG tablet Take 1 tablet (40 mg total) by mouth daily. 05/29/18  Yes Medina-Vargas, Monina C, NP  potassium chloride (K-DUR,KLOR-CON) 20 MEQ tablet Take 1 tablet (20 mEq total) by mouth 2 (two) times daily. 07/09/18  Yes Patrecia Pour, MD  rOPINIRole (REQUIP) 4 MG tablet Take 1 tablet (4 mg total) by mouth at bedtime. Patient taking differently: Take 2-4 mg by mouth See admin instructions. Take 2 mg by mouth in the morning and 4 mg at bedtime 05/29/18  Yes Medina-Vargas, Monina C, NP  spironolactone (ALDACTONE) 25 MG tablet Take 1 tablet (25 mg total) by mouth daily. 08/17/18  Yes Croitoru, Mihai, MD  tamsulosin (FLOMAX) 0.4 MG CAPS capsule Take 1 capsule (0.4 mg total) by mouth  daily after breakfast. 05/29/18  Yes Medina-Vargas, Monina C, NP  torsemide (DEMADEX) 20 MG tablet Take 4 tablets ( 80 mg ) twice a day 01/16/19  Yes Croitoru, Mihai, MD  traMADol (ULTRAM) 50 MG tablet Take 50 mg by mouth every 6 (six) hours as needed (for pain).  01/26/19  Yes [provider]  triamcinolone cream (KENALOG) 0.1 % Apply 1 application topically 2 (two) times daily. Patient taking differently: Apply 1 application topically 2 (two) times daily as needed (for itching, to affected sites).  05/29/18  Yes Medina-Vargas, Monina C, NP  VENTOLIN HFA 108 (90 Base) MCG/ACT inhaler Inhale 2 puffs into the lungs every 6 (six) hours as needed for wheezing or shortness of breath.  06/20/18  Yes [provider]  dutasteride (AVODART) 0.5 MG capsule Take 1 capsule (0.5 mg total) by mouth daily. Patient not taking: Reported on 02/02/2019 05/29/18   Medina-Vargas, Jaymes Graff C, NP   Allergies  Allergen Reactions  . Vicodin [Hydrocodone-Acetaminophen] Itching and Nausea Only  . Eliquis [Apixaban] Other (See Comments)    "Caused bleeding issues"   Review of Systems  Unable to perform ROS: Acuity of condition    Physical Exam Vitals signs and nursing note reviewed.  Constitutional:      General: He is in acute  distress.     Appearance: He is ill-appearing.  HENT:     Head: Normocephalic and atraumatic.  Neck:     Musculoskeletal: Normal range of motion.  Cardiovascular:     Rate and Rhythm: Tachycardia present.     Comments: Patient is currently complaining of severe chest pain, very restless.  Mild increased work of breathing noted at rest.  Severe scrotal edema Pulmonary:     Comments: Increased work of breathing at rest.  Patient is currently complaining of severe chest pain Genitourinary:    Comments: Scrotal edema Musculoskeletal: Normal range of motion.  Skin:    General: Skin is warm and dry.  Neurological:     Mental Status: He is alert and oriented to person, place,  and time.  Psychiatric:     Comments: Anxious     Vital Signs: BP (!) 102/54 (BP Location: Right Arm)   Pulse 71   Temp 97.6 F (36.4 C) (Oral)   Resp 19   Ht 5\' 4"  (1.626 m)   Wt 92.2 kg   SpO2 98%   BMI 34.90 kg/m  Pain Scale: 0-10   Pain Score: 0-No pain   SpO2: SpO2: 98 % O2 Device:SpO2: 98 % O2 Flow Rate: .O2 Flow Rate (L/min): 3 L/min  IO: Intake/output summary:   Intake/Output Summary (Last 24 hours) at 02/04/2019 1014 Last data filed at 02/04/2019 5093 Gross per 24 hour  Intake 1960.83 ml  Output 800 ml  Net 1160.83 ml    LBM: Last BM Date: 02/03/19 Baseline Weight: Weight: 92.2 kg Most recent weight: Weight: 92.2 kg     Palliative Assessment/Data:   Flowsheet Rows     Most Recent Value  Intake Tab  Referral Department  Hospitalist  Unit at Time of Referral  Med/Surg Unit  Palliative Care Primary Diagnosis  Cardiac  Date Notified  02/03/19  Palliative Care Type  Return patient Palliative Care  Reason for referral  Clarify Goals of Care, Psychosocial or Spiritual support, Counsel Regarding Hospice  Date of Admission  02/02/19  Date first seen by Palliative Care  02/04/19  # of days Palliative referral response time  1 Day(s)  # of days IP prior to Palliative referral  1  Clinical Assessment  Palliative Performance Scale Score  40%  Pain Max last 24 hours  Not able to report  Pain Min Last 24 hours  Not able to report  Dyspnea Max Last 24 Hours  Not able to report  Dyspnea Min Last 24 hours  Not able to report  Nausea Max Last 24 Hours  Not able to report  Nausea Min Last 24 Hours  Not able to report  Anxiety Max Last 24 Hours  Not able to report  Anxiety Min Last 24 Hours  Not able to report  Other Max Last 24 Hours  Not able to report  Psychosocial & Spiritual Assessment  Palliative Care Outcomes      Time In: 0930 Time Out: 1020 Time Total: 50 MIN Greater than 50%  of this time was spent counseling and coordinating care related to the  above assessment and plan.  Signed by: Dory Horn, NP   Please contact Palliative Medicine Team phone at [phone] for questions and concerns.  For individual provider: See Shea Evans

## 2019-02-05 LAB — BASIC METABOLIC PANEL
Anion gap: 17 — ABNORMAL HIGH (ref 5–15)
BUN: 156 mg/dL — ABNORMAL HIGH (ref 8–23)
CO2: 25 mmol/L (ref 22–32)
Calcium: 8.6 mg/dL — ABNORMAL LOW (ref 8.9–10.3)
Chloride: 92 mmol/L — ABNORMAL LOW (ref 98–111)
Creatinine, Ser: 3.51 mg/dL — ABNORMAL HIGH (ref 0.61–1.24)
GFR calc Af Amer: 17 mL/min — ABNORMAL LOW (ref >60)
GFR calc non Af Amer: 15 mL/min — ABNORMAL LOW (ref >60)
Glucose, Bld: 109 mg/dL — ABNORMAL HIGH (ref 70–99)
Potassium: 4.3 mmol/L (ref 3.5–5.1)
Sodium: 134 mmol/L — ABNORMAL LOW (ref 135–145)

## 2019-02-05 LAB — PATHOLOGIST SMEAR REVIEW

## 2019-02-05 MED ORDER — LORAZEPAM 0.5 MG PO TABS: 0.5000 mg | ORAL_TABLET | Freq: Every day | ORAL | Status: DC

## 2019-02-05 MED ORDER — HYDROCOD POLST-CPM POLST ER 10-8 MG/5ML PO SUER
5.0000 mL | Freq: Two times a day (BID) | ORAL | Status: DC
  Administered 2019-02-05: 5 mL via ORAL
  Filled 2019-02-05: qty 5

## 2019-02-05 MED ORDER — OXYCODONE HCL 5 MG/5ML PO SOLN: 5.0000 mg | ORAL | Status: DC | PRN

## 2019-02-05 MED ORDER — LORAZEPAM 0.5 MG PO TABS
0.5000 mg | ORAL_TABLET | Freq: Four times a day (QID) | ORAL | Status: DC | PRN
  Administered 2019-02-05: 16:00:00 0.5 mg via ORAL
  Filled 2019-02-05: qty 1

## 2019-02-05 MED ORDER — LORAZEPAM 0.5 MG PO TABS: 0.5000 mg | ORAL_TABLET | ORAL | 0 refills | Status: AC | PRN

## 2019-02-05 MED ORDER — DEXTROMETHORPHAN-GUAIFENESIN 10-100 MG/5ML PO LIQD: 5.0000 mL | ORAL | 0 refills | Status: AC | PRN

## 2019-02-05 MED ORDER — MORPHINE SULFATE (CONCENTRATE) 10 MG /0.5 ML PO SOLN: 2.0000 mg | ORAL | 0 refills | Status: AC | PRN

## 2019-02-05 MED ORDER — METOLAZONE 5 MG PO TABS
5.0000 mg | ORAL_TABLET | Freq: Once | ORAL | Status: AC
  Administered 2019-02-05: 5 mg via ORAL
  Filled 2019-02-05: qty 1

## 2019-02-05 NOTE — Progress Notes (Signed)
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 Patient has been considering stopping hospice services to pursue further treatment for his heart failure. Due to his increased edema, in spite of adjustments to his diuretics, his symptoms continued to worsen and he decided to go to the hospital.He has decided to rescind his DNR status.He is admitted with AKI on CKD and acute HF on chronic HF.  Complaining of a bad night, including a near code.  He is on the rapid response watch list.  Spoke with his daughter in detail this morning, she is a retired Marine scientist, she understands that his treatments are futile, she wants to respect his wishes to full treatment.  He was a DNR in the past, but this has been rescinded.  She feels he thinks that he could not receive the dobutamine and IV lasix if he was a DNR.  She has attempted to educate him otherwise.  She also states he has been adamant about not taking morphine for air hunger and discomfort due to perception that it will "kill him".  Ativan had been prescribed at home, he has been adamant about not taking due to same perceptions.  She states she has given it to him with his night medications and he rests much better.    Discussions surrounding his code status need further education.  His home care team has spent much time attempting to assure that he understands his code status and trajectory of his heart failure and kidney function.  V/S:  97.4 oral, 112/68, HR 70 vpaced with ectopy, RR 20, SPO2 95% RA, 97 kg-last 92.2Kg (?) I&O:  Last 24 hours 1529/1750 Abnormal Labs:  Na 134, Cl 92, BUN 156, Cr 3.51  Problem List AKI on CKD:  Cr down slightly 3.51, BUN 156 HF exacerbation:  Output 1.7L  IDT:  Discussed and updated  Family:  Left message for wife; extensive discussion with daughter [name].  She feels if she and her mother could  visit they may be able to help with discussion surrounding GOC.  Thank you, Venia Carbon RN, BSN, [facility] Liaison (in [place]) [phone]

## 2019-02-05 NOTE — Progress Notes (Signed)
Cardiology called back regarding [name] having periods of Bigeminy along with paced beats, no new orders received. He thinks it's D/T the Dobutamine, also informed him that I put him on a simple face mask with O2 at 3 L since he was desating on N/C, patient is a mouth breather, now sats 98% and above, also made aware of his low B/P.

## 2019-02-05 NOTE — Progress Notes (Signed)
Patient's sats on monitor were 79% with a good pleth, went back to check on patient and his breathing was almost cheyne stoke and aginol, lips were purple, O2 mask was on top of his head, he was unresponsive with pupils pinpoint, called for help, determined that he had a pulse and so we set up suction because he was starting to drty heave, cleaned out his mouth and during this process he started to swat at me and tell me to leave him alone. Patient said he was having some discomfort but vague as to how much and where it was. We placed the external pads on him and called th attending physician. Cardiology has been notified most of the night of this patient;s changes in VS, and heart rhythm but no orders received. Patient started to pink up and sats up to mid to upper 90's on 4l N/C. Spoke with [facility] and they will make sure the oncoming team is aware of what has happened and just to monitor for now.

## 2019-02-05 NOTE — TOC Transition Note (Signed)
Transition of Care [facility]) - CM/SW Discharge Note   Patient Details  Name: Anthony HECKARD Sr. MRN: [id number] Date of Birth: Jul 07, 1932  Transition of Care [facility]) CM/SW Contact:  Bethena Roys, RN Phone Number: 02/05/2019, 2:20 PM   Clinical Narrative: Pt currently active with [facility]. Daughter agreeable to resume services and to have non-emergency ambulance to transport home. Venia Carbon with [facility] aware that pt will transition home. Pt has home 02 in the home. RN Case Manger to call non emergency ambulance now. No further needs from the CM at this time.    Final next level of care: [facility] Barriers to Discharge: No Barriers Identified   Patient Goals and CMS Choice Patient states their goals for this hospitalization and ongoing recovery are:: to go home and die CMS Medicare.gov Compare Post Acute Care list provided to:: (active with [facility]) Choice offered to / list presented to : Adult Children  Discharge Placement                       Discharge Plan and Services In-house Referral: NA Discharge Planning Services: CM Consult Post Acute Care Choice: Hospice                    HH Arranged: RN [facility] Agency: [facility]) Date [facility] Agency Contacted: 02/05/19 Time [place]: [phone] Representative spoke with at [place]: [place] ([place]) Interventions     Readmission Risk Interventions No flowsheet data found.

## 2019-02-05 NOTE — Progress Notes (Signed)
Spoke with cardiology regarding patient's low B/P, no orders received, updated on condition and VS, will continue to monitor.

## 2019-02-05 NOTE — Discharge Summary (Addendum)
Physician Discharge Summary  Anthony STALEY Sr. DEY:[id number] DOB: 10/08/1931 DOA: 02/02/2019  PCP: [name], MD  Admit date: 02/02/2019 Discharge date: 02/05/2019  Admitted From: Home Disposition: Home with home hospice  Recommendations for Outpatient Follow-up:  Discharge home with resumption of home hospice. Follow-up with his cardiologist in 1 week.  Home Health: Home hospice Equipment/Devices: Oxygen (4 L via nasal cannula)  Discharge Condition: Guarded CODE STATUS: Partial code (no intubation, no CPR or defibrillator.  Okay with ACLS meds) Diet recommendation: Heart Healthy    Discharge Diagnoses:  Principal Problem:   Acute renal failure superimposed on stage 3 chronic kidney disease (HCC)  Active Problems:   Nonischemic cardiomyopathy - coronaries by angiography 2010   PAH (pulmonary artery hypertension) (HCC)   Chronic atrial fibrillation   Acute on chronic respiratory failure with hypoxia (HCC)   Acute on chronic combined systolic and diastolic CHF (congestive heart failure) (HCC)  Cardiorenal syndrome   Brief narrative/HPI Brief Narrative 85 year old male with end-stage CHF secondary to an ICM, symptomatic bradycardia status post CRT, COPD with chronic respiratory failure on home O2, OSA, pulmonary hypertension, chronic A. fib and chronic kidney disease stage III.  Patient currently on home hospice was brought to the ED with progressive swelling of the scrotum and generalized weakness.  Was found to be hypertensive in the ED.  Recently seen by his cardiologist to increase his diuretic due to increased leg edema and weight gain.  His torsemide was increased to 80 mg twice daily and was also taking metolazone. He now decided to be full code and wants full scope of treatment.  He was found to have worsened renal function from his baseline (3.77 from baseline around 1.5-2). Patient admitted for acute on chronic systolic CHF with cardiorenal  syndrome.  Hospital course Principal Problem:   Acute renal failure superimposed on stage 3 chronic kidney disease ([place]) Appears to be cardiorenal syndrome.  Diuretics were increased recently as outpatient.  Heart failure team on board.  Added IV dobutamine.  Started on IV Lasix 160 mg twice daily and put out about 1.75 L in 24 hours.  Creatinine still elevated at 3.5.  Overall guarded prognosis.  Acute on chronic combined systolic and diastolic CHF.  ([place]) Has end-stage CHF.  Patient not a candidate for advanced cardiac treatment.  Attempting a course of dobutamine to see if his renal function improves and be able to diurese him further with IV Lasix.  Did put out 1.75 L but still having  episodes of hypoxia with O2 desats in the 70s with apneic spells.  Extremely guarded prognosis.  He is not a candidate for hemodialysis or further aggressive treatment.    Palliative care consulted for goals of care discussion.  They had extensive discussion with patient and father with his wife and daughter today.   Patient family do not want him to die in the hospital and would like to come home and resume home hospice.  Given his home situation they are interested in having a caregiver present at home during the night and p this will be discussed with the patient.  Prescribed Roxanol as needed for pain, shortness of breath, Ativan as needed for anxiety and Tussionex for cough.  CODE STATUS discussed again and agreed to be partial code (no intubation, no CPR or defibrillator but will give with ACLS meds).  Discussed with heart failure team and they are okay with patient being discharged with home hospice.  Would recommend continuing home dose of  torsemide and hold metolazone as patient is uremic.  Aldactone discontinued given his worsening of function. Follow-up with his cardiologist as outpatient.   Active symptoms COPD with chronic respiratory failure Continue home O2 and PRN nebs  Chronic A.  fib Rate controlled.  Dyslipidemia Continue statin  Myelodysplastic syndrome Has low platelet and hemoglobin.    OSA Continue nighttime CPAP.      Family Communication  :  Discussed with wife on the phone during hospital stay.  Disposition Plan  :  Return home with hospice    Consults  : Heart failure  Procedures  : None   Discharge Instructions   Allergies as of 02/05/2019      Reactions   Vicodin [hydrocodone-acetaminophen] Itching, Nausea Only   Eliquis [apixaban] Other (See Comments)   "Caused bleeding issues"      Medication List    STOP taking these medications   dutasteride 0.5 MG capsule Commonly known as: AVODART   metolazone 2.5 MG tablet Commonly known as: ZAROXOLYN   spironolactone 25 MG tablet Commonly known as: ALDACTONE     TAKE these medications   acetaminophen 650 MG CR tablet Commonly known as: TYLENOL Take 1,300 mg by mouth 2 (two) times a day.   allopurinol 100 MG tablet Commonly known as: ZYLOPRIM Take 2 tablets (200 mg total) by mouth daily.   ARTIFICIAL TEARS PF OP Place 2 drops into both eyes 3 (three) times daily as needed (for dryness).   diphenhydrAMINE-zinc acetate cream Commonly known as: BENADRYL Apply topically 2 (two) times daily as needed for itching. What changed:   how much to take  reasons to take this   docusate sodium 100 MG capsule Commonly known as: COLACE Take 100 mg by mouth daily with breakfast.   escitalopram 5 MG tablet Commonly known as: LEXAPRO Take 1 tablet (5 mg total) by mouth at bedtime.   ferrous sulfate 325 (65 FE) MG tablet Take 325 mg by mouth 2 (two) times daily with a meal.   hydrOXYzine 25 MG tablet Commonly known as: ATARAX/VISTARIL TAKE 1 TABLET BY MOUTH EVERY 6 HOURS AS NEEDED FOR ITCHING What changed: See the new instructions.   ipratropium-albuterol 0.5-2.5 (3) MG/3ML Soln Commonly known as: DUONEB Take 3 mLs by nebulization every 4 (four) hours as  needed. What changed: reasons to take this   loratadine 10 MG tablet Commonly known as: CLARITIN Take 10 mg by mouth daily with breakfast.   mirtazapine 15 MG tablet Commonly known as: REMERON Take 1 tablet (15 mg total) by mouth at bedtime.   multivitamin with minerals Tabs tablet Take 1 tablet by mouth daily.   OXYGEN Inhale 4 L into the lungs continuous.   pantoprazole 40 MG tablet Commonly known as: PROTONIX Take 1 tablet (40 mg total) by mouth daily.   potassium chloride SA 20 MEQ tablet Commonly known as: K-DUR Take 1 tablet (20 mEq total) by mouth 2 (two) times daily.   PreserVision AREDS 2 Caps Take 1 capsule by mouth at bedtime.   rOPINIRole 4 MG tablet Commonly known as: REQUIP Take 1 tablet (4 mg total) by mouth at bedtime. What changed:   how much to take  when to take this  additional instructions   tamsulosin 0.4 MG Caps capsule Commonly known as: FLOMAX Take 1 capsule (0.4 mg total) by mouth daily after breakfast.   torsemide 20 MG tablet Commonly known as: DEMADEX Take 4 tablets ( 80 mg ) twice a day   traMADol 50 MG  tablet Commonly known as: ULTRAM Take 50 mg by mouth every 6 (six) hours as needed (for pain).   triamcinolone cream 0.1 % Commonly known as: KENALOG Apply 1 application topically 2 (two) times daily. What changed:   when to take this  reasons to take this   Ventolin HFA 108 (90 Base) MCG/ACT inhaler Generic drug: albuterol Inhale 2 puffs into the lungs every 6 (six) hours as needed for wheezing or shortness of breath.   Morphine sulfate (morphine concentrate) 10 mg / 0.5 mL concentrated solution Take 0.1 mL (2 mg total) by mouth every 2 hours as needed for severe pain and shortness of breath (total 15 mL)  Ativan 0.5 mg tablet Take 1 tablet every 4 hours as needed for anxiety  Dextromethorphan-guaifenesin ([place] DM) 10-100 mg / 5 mL liquid Take 5 mL by mouth every 4 hours as needed for cough    Follow-up  Information    Croitoru, Mihai, MD Follow up in 1 week(s).   Specialty: Cardiology Contact information: [address] [place] [address] [phone]          Allergies  Allergen Reactions  . Vicodin [Hydrocodone-Acetaminophen] Itching and Nausea Only  . Eliquis [Apixaban] Other (See Comments)    "Caused bleeding issues"      Procedures/Studies: Dg Chest 2 View  Result Date: 01/16/2019 CLINICAL DATA:  Shortness of breath EXAM: CHEST - 2 VIEW COMPARISON:  CT chest 11/04/2016 FINDINGS: Bilateral mild interstitial thickening. No focal consolidation, pleural effusion or pneumothorax. Stable cardiomegaly. Dual lead cardiac pacemaker. Moderate osteoarthritis of bilateral glenohumeral joints. No acute osseous abnormality. IMPRESSION: Cardiomegaly with mild pulmonary vascular congestion. Electronically Signed   By: Kathreen Devoid   On: 01/16/2019 08:21   Ct Head Wo Contrast  Result Date: 02/02/2019 CLINICAL DATA:  Head trauma EXAM: CT HEAD WITHOUT CONTRAST CT CERVICAL SPINE WITHOUT CONTRAST TECHNIQUE: Multidetector CT imaging of the head and cervical spine was performed following the standard protocol without intravenous contrast. Multiplanar CT image reconstructions of the cervical spine were also generated. COMPARISON:  02/11/2018, 11/03/2017 FINDINGS: CT HEAD FINDINGS Brain: No evidence of acute infarction, hemorrhage, hydrocephalus, extra-axial collection or mass lesion/mass effect. Periventricular white matter hypodensity and global volume loss. Vascular: No hyperdense vessel or unexpected calcification. Skull: Normal. Negative for fracture or focal lesion. Sinuses/Orbits: No acute finding. Other: None. CT CERVICAL SPINE FINDINGS Examination of the cervical spine is significantly limited by patient motion artifact. Alignment: Normal. Skull base and vertebrae: No acute fracture. No primary bone lesion or focal pathologic process. Soft tissues and spinal canal: No prevertebral  fluid or swelling. No visible canal hematoma. Disc levels: Focally severe disc degenerative disease and osteophytosis at C4-C5 with endplate sclerosis, substantially worsened in comparison to CT dated 11/03/2017. Upper chest: Negative. Other: None. IMPRESSION: 1. No acute intracranial pathology. Small-vessel white matter disease and global volume loss. 2. Examination of the cervical spine is significantly limited by patient motion artifact. Within this limitation, no obvious fracture or static subluxation of the cervical spine. 3. Focally severe disc degenerative disease and osteophytosis at C4-C5 with endplate sclerosis, substantially worsened in comparison to CT dated 11/03/2017. Electronically Signed   By: Eddie Candle M.D.   On: 02/02/2019 20:08   Ct Cervical Spine Wo Contrast  Result Date: 02/02/2019 CLINICAL DATA:  Head trauma EXAM: CT HEAD WITHOUT CONTRAST CT CERVICAL SPINE WITHOUT CONTRAST TECHNIQUE: Multidetector CT imaging of the head and cervical spine was performed following the standard protocol without intravenous contrast. Multiplanar CT image  reconstructions of the cervical spine were also generated. COMPARISON:  02/11/2018, 11/03/2017 FINDINGS: CT HEAD FINDINGS Brain: No evidence of acute infarction, hemorrhage, hydrocephalus, extra-axial collection or mass lesion/mass effect. Periventricular white matter hypodensity and global volume loss. Vascular: No hyperdense vessel or unexpected calcification. Skull: Normal. Negative for fracture or focal lesion. Sinuses/Orbits: No acute finding. Other: None. CT CERVICAL SPINE FINDINGS Examination of the cervical spine is significantly limited by patient motion artifact. Alignment: Normal. Skull base and vertebrae: No acute fracture. No primary bone lesion or focal pathologic process. Soft tissues and spinal canal: No prevertebral fluid or swelling. No visible canal hematoma. Disc levels: Focally severe disc degenerative disease and osteophytosis at C4-C5  with endplate sclerosis, substantially worsened in comparison to CT dated 11/03/2017. Upper chest: Negative. Other: None. IMPRESSION: 1. No acute intracranial pathology. Small-vessel white matter disease and global volume loss. 2. Examination of the cervical spine is significantly limited by patient motion artifact. Within this limitation, no obvious fracture or static subluxation of the cervical spine. 3. Focally severe disc degenerative disease and osteophytosis at C4-C5 with endplate sclerosis, substantially worsened in comparison to CT dated 11/03/2017. Electronically Signed   By: Eddie Candle M.D.   On: 02/02/2019 20:08   Dg Chest Port 1 View  Result Date: 02/02/2019 CLINICAL DATA:  Cough and congestive heart failure EXAM: PORTABLE CHEST 1 VIEW COMPARISON:  January 15, 2019 FINDINGS: The heart size remains enlarged. Left-sided pacemaker is noted. The lung volumes are somewhat low. Advanced degenerative changes are noted of both glenohumeral joints. There is no pneumothorax. Aortic calcifications are noted. IMPRESSION: Cardiomegaly with mild vascular congestion. Electronically Signed   By: Constance Holster M.D.   On: 02/02/2019 19:23       Subjective: Overnight desatted to the 70s and had episodes of apneic spells.  Reports still feeling short of breath and having cough.  Discharge Exam: Vitals:   02/05/19 0525 02/05/19 0615  BP: (!) 87/48 112/68  Pulse: 70 70  Resp: 13 20  Temp:    SpO2: 97% 95%   Vitals:   02/05/19 0455 02/05/19 0505 02/05/19 0525 02/05/19 0615  BP: (!) 87/39 (!) 90/40 (!) 87/48 112/68  Pulse: (!) 40 (!) 41 70 70  Resp: 12 13 13 20   Temp:      TempSrc:      SpO2: 100% 98% 97% 95%  Weight:      Height:        General: Elderly male, fatigued HEENT: Moist mucosa, supple neck, JVD + Chest: Bibasilar crackles CVs: Normal S1-S2, no murmurs GI: Soft, nondistended, nontender, scrotal edema + Musculoskeletal: Warm, trace edema bilaterally     The results of  significant diagnostics from this hospitalization (including imaging, microbiology, ancillary and laboratory) are listed below for reference.     Microbiology: Recent Results (from the past 240 hour(s))  SARS Coronavirus 2 (CEPHEID - Performed in [facility] hospital lab), Hosp Order     Status: None   Collection Time: 02/02/19  8:41 PM   Specimen: Nasopharyngeal Swab  Result Value Ref Range Status   SARS Coronavirus 2 NEGATIVE NEGATIVE Final    Comment: (NOTE) If result is NEGATIVE SARS-CoV-2 target nucleic acids are NOT DETECTED. The SARS-CoV-2 RNA is generally detectable in upper and lower  respiratory specimens during the acute phase of infection. The lowest  concentration of SARS-CoV-2 viral copies this assay can detect is 250  copies / mL. A negative result does not preclude SARS-CoV-2 infection  and should not be used  as the sole basis for treatment or other  patient management decisions.  A negative result may occur with  improper specimen collection / handling, submission of specimen other  than nasopharyngeal swab, presence of viral mutation(s) within the  areas targeted by this assay, and inadequate number of viral copies  (<250 copies / mL). A negative result must be combined with clinical  observations, patient history, and epidemiological information. If result is POSITIVE SARS-CoV-2 target nucleic acids are DETECTED. The SARS-CoV-2 RNA is generally detectable in upper and lower  respiratory specimens dur ing the acute phase of infection.  Positive  results are indicative of active infection with SARS-CoV-2.  Clinical  correlation with patient history and other diagnostic information is  necessary to determine patient infection status.  Positive results do  not rule out bacterial infection or co-infection with other viruses. If result is PRESUMPTIVE POSTIVE SARS-CoV-2 nucleic acids MAY BE PRESENT.   A presumptive positive result was obtained on the submitted specimen   and confirmed on repeat testing.  While 2019 novel coronavirus  (SARS-CoV-2) nucleic acids may be present in the submitted sample  additional confirmatory testing may be necessary for epidemiological  and / or clinical management purposes  to differentiate between  SARS-CoV-2 and other Sarbecovirus currently known to infect humans.  If clinically indicated additional testing with an alternate test  methodology [phone]) is advised. The SARS-CoV-2 RNA is generally  detectable in upper and lower respiratory sp ecimens during the acute  phase of infection. The expected result is Negative. Fact Sheet for Patients:  StrictlyIdeas.no Fact Sheet for Healthcare Providers: BankingDealers.co.za This test is not yet approved or cleared by the [place] FDA and has been authorized for detection and/or diagnosis of SARS-CoV-2 by FDA under an Emergency Use Authorization (EUA).  This EUA will remain in effect (meaning this test can be used) for the duration of the COVID-19 declaration under Section 564(b)(1) of the Act, 21 U.S.C. section 360bbb-3(b)(1), unless the authorization is terminated or revoked sooner. Performed at [facility], [place] [address], [place], [place] [address]   MRSA PCR Screening     Status: Abnormal   Collection Time: 02/03/19  4:52 AM   Specimen: Nasal Mucosa; Nasopharyngeal  Result Value Ref Range Status   MRSA by PCR POSITIVE (A) NEGATIVE Final    Comment:        The GeneXpert MRSA Assay (FDA approved for NASAL specimens only), is one component of a comprehensive MRSA colonization surveillance program. It is not intended to diagnose MRSA infection nor to guide or monitor treatment for MRSA infections. RESULT CALLED TO, READ BACK BY AND VERIFIED WITH: T.BEMABE RN AT 0740 02/03/2019 BY A.DAVIS Performed at [facility], [place] [address], [place], [place] [address]      Labs: BNP (last 3 results) Recent  Labs    01/09/19 1631 01/15/19 1440 02/02/19 1832  BNP 1,654.9* 2,258.0* 4,235.3*   Basic Metabolic Panel: Recent Labs  Lab 02/02/19 1832 02/03/19 0109 02/03/19 1408 02/04/19 0519 02/05/19 0739  NA 129*  --  134* 132* 134*  K 4.8  --  4.7 4.4 4.3  CL 88*  --  91* 94* 92*  CO2 29  --  28 25 25   GLUCOSE 118*  --  122* 104* 109*  BUN 150*  --  152* 152* 156*  CREATININE 3.61* 3.77* 3.34* 3.55* 3.51*  CALCIUM 8.8*  --  9.0 8.5* 8.6*   Liver Function Tests: Recent Labs  Lab 02/02/19  1832  AST 101*  ALT 104*  ALKPHOS 95  BILITOT 2.7*  PROT 6.4*  ALBUMIN 3.3*   No results for input(s): LIPASE, AMYLASE in the last 168 hours. No results for input(s): AMMONIA in the last 168 hours. CBC: Recent Labs  Lab 02/02/19 1832 02/03/19 0109 02/04/19 0519  WBC 4.0 4.2 3.4*  NEUTROABS 3.3  --  2.6  HGB 8.3* 8.5* 7.8*  HCT 25.8* 26.5* 24.6*  MCV 114.2* 114.7* 115.5*  PLT 108* 98* 82*   Cardiac Enzymes: No results for input(s): CKTOTAL, CKMB, CKMBINDEX, TROPONINI in the last 168 hours. BNP: Invalid input(s): POCBNP CBG: No results for input(s): GLUCAP in the last 168 hours. D-Dimer No results for input(s): DDIMER in the last 72 hours. Hgb A1c No results for input(s): HGBA1C in the last 72 hours. Lipid Profile No results for input(s): CHOL, HDL, LDLCALC, TRIG, CHOLHDL, LDLDIRECT in the last 72 hours. Thyroid function studies No results for input(s): TSH, T4TOTAL, T3FREE, THYROIDAB in the last 72 hours.  Invalid input(s): FREET3 Anemia work up No results for input(s): VITAMINB12, FOLATE, FERRITIN, TIBC, IRON, RETICCTPCT in the last 72 hours. Urinalysis    Component Value Date/Time   COLORURINE YELLOW 05/05/2018 2247   APPEARANCEUR CLEAR 05/05/2018 2247   LABSPEC 1.008 05/05/2018 2247   PHURINE 5.0 05/05/2018 2247   GLUCOSEU NEGATIVE 05/05/2018 2247   Reading NEGATIVE 05/05/2018 2247   [place] NEGATIVE 05/05/2018 2247   KETONESUR NEGATIVE 05/05/2018 2247    PROTEINUR NEGATIVE 05/05/2018 2247   NITRITE NEGATIVE 05/05/2018 2247   LEUKOCYTESUR NEGATIVE 05/05/2018 2247   Sepsis Labs Invalid input(s): PROCALCITONIN,  WBC,  LACTICIDVEN Microbiology Recent Results (from the past 240 hour(s))  SARS Coronavirus 2 (CEPHEID - Performed in [facility] hospital lab), Hosp Order     Status: None   Collection Time: 02/02/19  8:41 PM   Specimen: Nasopharyngeal Swab  Result Value Ref Range Status   SARS Coronavirus 2 NEGATIVE NEGATIVE Final    Comment: (NOTE) If result is NEGATIVE SARS-CoV-2 target nucleic acids are NOT DETECTED. The SARS-CoV-2 RNA is generally detectable in upper and lower  respiratory specimens during the acute phase of infection. The lowest  concentration of SARS-CoV-2 viral copies this assay can detect is 250  copies / mL. A negative result does not preclude SARS-CoV-2 infection  and should not be used as the sole basis for treatment or other  patient management decisions.  A negative result may occur with  improper specimen collection / handling, submission of specimen other  than nasopharyngeal swab, presence of viral mutation(s) within the  areas targeted by this assay, and inadequate number of viral copies  (<250 copies / mL). A negative result must be combined with clinical  observations, patient history, and epidemiological information. If result is POSITIVE SARS-CoV-2 target nucleic acids are DETECTED. The SARS-CoV-2 RNA is generally detectable in upper and lower  respiratory specimens dur ing the acute phase of infection.  Positive  results are indicative of active infection with SARS-CoV-2.  Clinical  correlation with patient history and other diagnostic information is  necessary to determine patient infection status.  Positive results do  not rule out bacterial infection or co-infection with other viruses. If result is PRESUMPTIVE POSTIVE SARS-CoV-2 nucleic acids MAY BE PRESENT.   A presumptive positive result was  obtained on the submitted specimen  and confirmed on repeat testing.  While 2019 novel coronavirus  (SARS-CoV-2) nucleic acids may be present in the submitted sample  additional confirmatory testing may be  necessary for epidemiological  and / or clinical management purposes  to differentiate between  SARS-CoV-2 and other Sarbecovirus currently known to infect humans.  If clinically indicated additional testing with an alternate test  methodology [phone]) is advised. The SARS-CoV-2 RNA is generally  detectable in upper and lower respiratory sp ecimens during the acute  phase of infection. The expected result is Negative. Fact Sheet for Patients:  StrictlyIdeas.no Fact Sheet for Healthcare Providers: BankingDealers.co.za This test is not yet approved or cleared by the [place] FDA and has been authorized for detection and/or diagnosis of SARS-CoV-2 by FDA under an Emergency Use Authorization (EUA).  This EUA will remain in effect (meaning this test can be used) for the duration of the COVID-19 declaration under Section 564(b)(1) of the Act, 21 U.S.C. section 360bbb-3(b)(1), unless the authorization is terminated or revoked sooner. Performed at [facility], [place] [address] [place], [place] [address]   MRSA PCR Screening     Status: Abnormal   Collection Time: 02/03/19  4:52 AM   Specimen: Nasal Mucosa; Nasopharyngeal  Result Value Ref Range Status   MRSA by PCR POSITIVE (A) NEGATIVE Final    Comment:        The GeneXpert MRSA Assay (FDA approved for NASAL specimens only), is one component of a comprehensive MRSA colonization surveillance program. It is not intended to diagnose MRSA infection nor to guide or monitor treatment for MRSA infections. RESULT CALLED TO, READ BACK BY AND VERIFIED WITH: T.BEMABE RN AT 0740 02/03/2019 BY A.DAVIS Performed at [facility], [place] [address] [place],  [address]       Time coordinating discharge: 35 minutes  SIGNED:   Louellen Molder, MD  [facility] 02/05/2019, 1:08 PM Pager   If 7PM-7AM, please contact night-coverage [email or web address] Password [email or web address]

## 2019-02-05 NOTE — Progress Notes (Addendum)
Patient ID: Anthony Agar Sr., male   DOB: 01/30/1932, 85 y.o.   MRN: [id number]     Advanced Heart Failure Rounding Note  PCP-Cardiologist: [name], MD   Subjective:    Labs pending.   He remains on dobutamine 2.5 mcg. Met with Palliative Care for goals of care.   Earlier this morning he was hypoxic with sats in the 70s and unresponsive but had pulse. After his mouth was cleaned he was responsive with sats >90.    Complaining of a cough and being tired. Denies SOB.    Objective:   Weight Range: 97 kg Body mass index is 36.71 kg/m.   Vital Signs:   Temp:  [97.4 F (36.3 C)-97.8 F (36.6 C)] 97.4 F (36.3 C) (06/29 0424) Pulse Rate:  [40-78] 70 (06/29 0615) Resp:  [12-21] 20 (06/29 0615) BP: (86-112)/(39-84) 112/68 (06/29 0615) SpO2:  [90 %-100 %] 95 % (06/29 0615) Weight:  [97 kg] 97 kg (06/29 0424) Last BM Date: 02/04/19  Weight change: Filed Weights   02/03/19 0056 02/05/19 0424  Weight: 92.2 kg 97 kg    Intake/Output:   Intake/Output Summary (Last 24 hours) at 02/05/2019 0744 Last data filed at 02/05/2019 0500 Gross per 24 hour  Intake 1524.9 ml  Output 1750 ml  Net -225.1 ml      Physical Exam    General:  No resp difficulty HEENT: normal Neck: supple. JVP 12-14. Carotids 2+ bilat; no bruits. No lymphadenopathy or thryomegaly appreciated. Cor: PMI nondisplaced. Irregular rate & rhythm. No rubs, gallops or murmurs. Lungs: Decreased in the bases. Clear on 2 liters  Abdomen: soft, nontender, nondistended. No hepatosplenomegaly. No bruits or masses. Good bowel sounds. Extremities: no cyanosis, clubbing, rash, edema Neuro: alert & orientedx3, cranial nerves grossly intact. moves all 4 extremities w/o difficulty. Affect pleasant   Telemetry   A fib BiV paced.   Labs    CBC Recent Labs    02/02/19 1832 02/03/19 0109 02/04/19 0519  WBC 4.0 4.2 3.4*  NEUTROABS 3.3  --  2.6  HGB 8.3* 8.5* 7.8*  HCT 25.8* 26.5* 24.6*  MCV 114.2* 114.7*  115.5*  PLT 108* 98* 82*   Basic Metabolic Panel Recent Labs    02/03/19 1408 02/04/19 0519  NA 134* 132*  K 4.7 4.4  CL 91* 94*  CO2 28 25  GLUCOSE 122* 104*  BUN 152* 152*  CREATININE 3.34* 3.55*  CALCIUM 9.0 8.5*   Liver Function Tests Recent Labs    02/02/19 1832  AST 101*  ALT 104*  ALKPHOS 95  BILITOT 2.7*  PROT 6.4*  ALBUMIN 3.3*   No results for input(s): LIPASE, AMYLASE in the last 72 hours. Cardiac Enzymes No results for input(s): CKTOTAL, CKMB, CKMBINDEX, TROPONINI in the last 72 hours.  BNP: BNP (last 3 results) Recent Labs    01/09/19 1631 01/15/19 1440 02/02/19 1832  BNP 1,654.9* 2,258.0* 2,562.6*    ProBNP (last 3 results) No results for input(s): PROBNP in the last 8760 hours.   D-Dimer No results for input(s): DDIMER in the last 72 hours. Hemoglobin A1C No results for input(s): HGBA1C in the last 72 hours. Fasting Lipid Panel No results for input(s): CHOL, HDL, LDLCALC, TRIG, CHOLHDL, LDLDIRECT in the last 72 hours. Thyroid Function Tests No results for input(s): TSH, T4TOTAL, T3FREE, THYROIDAB in the last 72 hours.  Invalid input(s): FREET3  Other results:   Imaging    No results found.   Medications:     Scheduled Medications: .  acetaminophen  500 mg Oral TID  . allopurinol  200 mg Oral Daily  . Chlorhexidine Gluconate Cloth  6 each Topical Q0600  . docusate sodium  100 mg Oral Q breakfast  . escitalopram  5 mg Oral QHS  . ferrous sulfate  325 mg Oral BID WC  . heparin  5,000 Units Subcutaneous Q8H  . hydrOXYzine  25 mg Oral Q6H  . loratadine  10 mg Oral Q breakfast  . mirtazapine  15 mg Oral QHS  . multivitamin  1 tablet Oral QHS  . multivitamin with minerals  1 tablet Oral Daily  . mupirocin ointment  1 application Nasal BID  . pantoprazole  40 mg Oral Daily  . rOPINIRole  2 mg Oral Daily  . rOPINIRole  4 mg Oral QHS  . tamsulosin  0.4 mg Oral QPC breakfast    Infusions: . sodium chloride 6.5 mL/hr at  02/05/19 0500  . DOBUTamine 2.5 mcg/kg/min (02/05/19 0500)  . furosemide 160 mg (02/04/19 2358)    PRN Medications: albuterol, calcium carbonate (dosed in mg elemental calcium), camphor-menthol **AND** hydrOXYzine, diphenhydrAMINE-zinc acetate, docusate sodium, feeding supplement (NEPRO CARB STEADY), ondansetron **OR** ondansetron (ZOFRAN) IV, sorbitol, traMADol, triamcinolone cream, zolpidem   Assessment/Plan   1. Acute on chronic systolic CHF: History of end stage nonischemic cardiomyopathy, low output HF.  Most recent echo in 11/19 with severely dilated LV, EF 25-30%.  He has been under hospice care, but with worsening edema and fatigue decided to come to the ER.  St Jude CRT-P, he has been BiV pacing appropriately. He has been found to have AKI on CKD 3 with markedly elevated BUN and creatinine to 3.77 at admission.  Diuretics held at admission, poor UOP.  He is volume overloaded on exam.  Patient is not a candidate for hemodialysis.  He is not a candidate for advanced cardiac therapies.   Per Dr Aundra Dubin-  He has no good options at this time to turn around his course.  I told him that we could try to get him back home with hospice versus attempt a time-limited course of dobutamine in the hospital to see if it will allow renal function to stabilize and allow [place] to diurese him some.   - He wanted to try dobutamine.  He does not feel better on dobutamine.   - Continue dobutamine at 2.5. Would try to avoid PICC line and run peripherally as would like this to be temporary (he could not go home with hospice on dobutamine). - Volume status improving. BMET pending.   - Continue  Lasix 160 mg IV bid today, follow response. I/O not accurate.  2. Atrial fibrillation: Chronic. He has not been anticoagulated due to AVM bleedingand MDS with anemia/thrombocytopenia. 3. AKI on CKD stage 3:  Cardiorenal syndrome in setting of attempting aggressive diuresis as an outpatient. He is mildly lethargic today,  suspect he has some signs of uremia.  - Not HD candidate.  - Continue dobutamine and continue to diurese. BMET pending.   4. Myelodysplastic syndrome: Pancytopenia, plts 82K with hgb 7.8.  - Can use heparin for DVT prophylaxis.  5. COPD: On home oxygen at baseline.  6. OSA: CPAP at night.  Labs pending. Palliative Care following for goals of care.   Length of Stay: 3  Amy Clegg, NP  02/05/2019, 7:44 AM  Advanced Heart Failure Team Pager [phone] (M-F; 7a - 4p)  Please contact [facility] for night-coverage after hours (4p -7a ) and weekends on [email or web address]  Patient  seen with NP, agree with the above note.  Overnight, became markedly hypoxemic when oxygen came off with impending code. This morning, he is stable but "fidgety" and I am concerned for worsening uremia.  Some UOP with high dose Lasix yesterday but not marked.   He is mildly confused.  JVP 12-14 cm.  Regular S1S2.  No edema.   I will continue IV Lasix for 1 more day and will give him a dose of metolazone. Will aim to get him back to po diuretics tomorrow and start weaning off dobutamine.  I do not feel that we have helped him any with dobutamine.    BUN 152 today, appears "fidgety" with mild confusion.  Suspect due to uremia.  Not HD candidate.   We do not have good options here to improve his clinical status.  I again recommended resumption of hospice care.  I am not sure that we will be able to get him home in his current state, may need [employer].  Need to try to get him out of bed. He still wants aggressive management at this time and is not DNR.  Unfortunately, I think that his risk for cardiac or pulmonary arrest is quite high.  Family understands situation, may be helpful to have wife come in to talk with him.   Loralie Champagne 02/05/2019 11:21 AM

## 2019-02-05 NOTE — Progress Notes (Signed)
Daily Progress Note   Patient Name: Anthony GIAMMARINO Sr.       Date: 02/05/2019 DOB: 29-Jan-1932  Age: 85 y.o. MRN#: [id number] Attending Physician: Louellen Molder, MD Primary Care Physician: [name], MD Admit Date: 02/02/2019  Reason for Consultation/Follow-up: Establishing goals of care, Non pain symptom management and Psychosocial/spiritual support  Subjective: I spoke with the patient at bedside.  How are you?  "I'm not good I'm dying."  Later when visibly uncomfortable he prays for relief and states"let God's will be done".  The patient is continuously moving in bed.  He has to urinate but is having difficulty doing so, he is itchy on his back, he has restless leg, he has a troublesome non-productive cough.  He appears somewhat miserable.  We talked about dying.  The patient wants to die at home (not at hospice house and not in the hospital).  He indicates that his wife is overwhelmed caring for him at home.  His daughter does her absolute best but is taking care of her parents until 2 am and then is up at 7:00 am to take care of her grandchildren.  Consequently she is overwhelmed.    While in the room I add on [name] (wife).  We talked about how to make him more comfortable.  Patient and wife spoke to each other briefly.  I spoke with patient about code status.  He does not want chest compressions.  He does not want want intubation or shock.  He only wants medications that may help resuscitate him.  He is a partial code.  I spoke [name] (dtr) she has discussed the patient's condition with the family.  They have all agreed that if we have done all we can do medically to tune him up they want him to come home.  They do not want him to die in the hospital.  They want him to die at home.   Dtr states patient will have to agree to hire help at night in order to come home.  After speaking with Dr. Clementeen Graham and the medical team I revisited the patient with his daughter [name] on conference call.  The patient stated "I'm going to die tonight.  I want to go home."  We discussed getting him home with hospice today.  We talked about the  need to hire a care taker to be there at night.  Patient is in agreement.   Dtr and patient chatted for a minute more.  We decided to send him home by ambulance today.  The patient's wife was calling the floor simultaneously.  I explained that we were sending him home today which she was comfortable with but when I brought up a hired care taker - she objected.  I asked her to discuss it with [name].  Patient's wife supports discharge to home with Hospice services and travelling home by ambulance.  She requested that we have him home by 6:00 pm.   Assessment: Elderly fidgety gentleman.  Appears uncomfortable. Cardio-renal syndrome. Pancytopenia. Having difficulty urinating.  Does not feel better on dobutamine.  Nearing EOL.   Patient Profile/HPI:  85 y.o. male  with past medical history of end-stage CHF with an ejection fraction of 25 to 30% per echo 2019, hypertension, MDS (followed by Dr. Marin Olp), OSA intermittently on CPAP admitted on 02/02/2019 with acute kidney injury as well as scrotal edema.  Patient's BNP on admission was 2562.  His baseline creatinine on 01/09/2019 was 1.81.  As of 02/04/2019 creatinine now 3.55, GFR 17.  He is currently on a dobutamine drip.  Felt to have progressive disease now with cardiorenal syndrome.  Patient is currently a client of hospice and palliative care of [facility]. He has not revoked his services and this is a covered admission.  Patient has been seen multiple times in 2019 by multiple palliative medicine providers.  Consult ordered again for goals of care.    Length of Stay: 3  Current Medications: Scheduled Meds:  .  acetaminophen  500 mg Oral TID  . allopurinol  200 mg Oral Daily  . Chlorhexidine Gluconate Cloth  6 each Topical Q0600  . chlorpheniramine-HYDROcodone  5 mL Oral Q12H  . docusate sodium  100 mg Oral Q breakfast  . escitalopram  5 mg Oral QHS  . ferrous sulfate  325 mg Oral BID WC  . heparin  5,000 Units Subcutaneous Q8H  . hydrOXYzine  25 mg Oral Q6H  . loratadine  10 mg Oral Q breakfast  . LORazepam  0.5 mg Oral QHS  . mirtazapine  15 mg Oral QHS  . multivitamin  1 tablet Oral QHS  . multivitamin with minerals  1 tablet Oral Daily  . mupirocin ointment  1 application Nasal BID  . pantoprazole  40 mg Oral Daily  . rOPINIRole  2 mg Oral Daily  . rOPINIRole  4 mg Oral QHS  . tamsulosin  0.4 mg Oral QPC breakfast    Continuous Infusions: . sodium chloride 6.5 mL/hr at 02/05/19 0500  . DOBUTamine 2.5 mcg/kg/min (02/05/19 0500)  . furosemide 160 mg (02/05/19 1213)    PRN Meds: albuterol, calcium carbonate (dosed in mg elemental calcium), camphor-menthol **AND** hydrOXYzine, diphenhydrAMINE-zinc acetate, docusate sodium, feeding supplement (NEPRO CARB STEADY), LORazepam, ondansetron **OR** ondansetron (ZOFRAN) IV, sorbitol, traMADol, triamcinolone cream, zolpidem  Physical Exam       Chronically ill appearing male.  Awake, alert, fidgety, appears uncomfortable, myoclonic jerking movement. CV rrr Resp no distress on 4L Abdomen - ecchymosis noted from right lower back to right groin.  Vital Signs: BP 112/68   Pulse 70   Temp (!) 97.4 F (36.3 C) (Axillary)   Resp 20   Ht 5\' 4"  (1.626 m)   Wt 97 kg   SpO2 95%   BMI 36.71 kg/m  SpO2: SpO2: 95 % O2 Device: O2  Device: Room Air O2 Flow Rate: O2 Flow Rate (L/min): 4 L/min  Intake/output summary:   Intake/Output Summary (Last 24 hours) at 02/05/2019 1242 Last data filed at 02/05/2019 1214 Gross per 24 hour  Intake 1164.9 ml  Output 1050 ml  Net 114.9 ml   LBM: Last BM Date: 02/04/19 Baseline Weight: Weight: 92.2 kg Most  recent weight: Weight: 97 kg       Palliative Assessment/Data:  20%      Patient Active Problem List   Diagnosis Date Noted  . CHF exacerbation ([place]) 02/02/2019  . Palliative care encounter   . DNR (do not resuscitate)   . PNA (pneumonia) 06/27/2018  . Lethargy 05/16/2018  . MDS (myelodysplastic syndrome), high grade ([place]) 05/11/2018  . Advanced care planning/counseling discussion   . Goals of care, counseling/discussion   . Palliative care by specialist   . AKI (acute kidney injury) ([place])   . Acute on chronic combined systolic and diastolic CHF (congestive heart failure) ([place]) 05/03/2018  . Petechial rash 05/03/2018  . Biventricular cardiac pacemaker  st Judes  dual chamber with LV in atrial port  03/29/2018  . Congestive heart failure, NYHA class 3, chronic, systolic ([place]) 57/32/2025  . GI bleed 11/03/2017  . Acute renal failure superimposed on stage 3 chronic kidney disease ([place]) 11/03/2017  . Chest pain 11/03/2017  . Pancytopenia ([place]) 09/26/2017  . CKD (chronic kidney disease), stage III ([place]) 09/26/2017  . Chronic pain 09/26/2017  . Acute on chronic respiratory failure with hypoxia ([place]) 09/26/2017  . Right shoulder injury, initial encounter 04/08/2017  . Chronic atrial fibrillation 11/23/2016  . Secondary hypercoagulable state ([place])   . Transaminitis   . PAH (pulmonary artery hypertension) ([place])   . Chronic systolic heart failure ([place]) 09/09/2015  . Poor short term memory 11/05/2013  . Fatigue 10/13/2011  . Dyspnea on exertion 10/13/2011  . Restless leg syndrome 10/13/2011  . Chronic anticoagulation 10/13/2011  . Nonischemic cardiomyopathy - coronaries by angiography 2010 10/13/2011  . OSA on CPAP 10/13/2011  . Tremor, hereditary, benign 10/13/2011  . Depression 10/13/2011  . Other secondary pulmonary hypertension ([place]) 10/13/2011  . MRSA (methicillin resistant Staphylococcus aureus) carrier 10/13/2011  . BPH (benign prostatic hyperplasia) 10/13/2011     Palliative Care Plan    Recommendations/Plan:  Code status changed to Partial inpatient meaning - no CPR, no intubation, no life support - only ACLS medications  Code status outpatient would be DNR.  Home today by ambulance  Home with Hospice  Comfort meds (ativan, roxicodone, tussionex)to be prescribed.  Goals of Care and Additional Recommendations:  Limitations on Scope of Treatment: Full Comfort Care  Code Status:  Limited code - no CPR, no intubation, no life support - only ACLS medications  Prognosis:   < 2 weeks   Discharge Planning:  Home with Hospice  Care plan was discussed with MD, RN, Case Mgr, Patient, Family.  Thank you for allowing the Palliative Medicine Team to assist in the care of this patient.  Total time spent:  90 min. Time in 11:00 Time out:  12:00 Time in: 12:45 Time out: 1: 15 pm.     Greater than 50%  of this time was spent counseling and coordinating care related to the above assessment and plan.  Florentina Jenny, PA-C Palliative Medicine  Please contact Palliative MedicineTeam phone at [phone] for questions and concerns between 7 am - 7 pm.   Please see AMION for individual provider pager numbers.

## 2019-02-06 DIAGNOSIS — I5020 Unspecified systolic (congestive) heart failure: Secondary | ICD-10-CM | POA: Diagnosis not present

## 2019-02-06 DIAGNOSIS — N183 Chronic kidney disease, stage 3 (moderate): Secondary | ICD-10-CM | POA: Diagnosis not present

## 2019-02-06 DIAGNOSIS — J449 Chronic obstructive pulmonary disease, unspecified: Secondary | ICD-10-CM | POA: Diagnosis not present

## 2019-02-06 DIAGNOSIS — I272 Pulmonary hypertension, unspecified: Secondary | ICD-10-CM | POA: Diagnosis not present

## 2019-02-06 DIAGNOSIS — I480 Paroxysmal atrial fibrillation: Secondary | ICD-10-CM | POA: Diagnosis not present

## 2019-02-06 DIAGNOSIS — J9611 Chronic respiratory failure with hypoxia: Secondary | ICD-10-CM | POA: Diagnosis not present

## 2019-02-07 DIAGNOSIS — N183 Chronic kidney disease, stage 3 (moderate): Secondary | ICD-10-CM | POA: Diagnosis not present

## 2019-02-07 DIAGNOSIS — M109 Gout, unspecified: Secondary | ICD-10-CM | POA: Diagnosis not present

## 2019-02-07 DIAGNOSIS — K219 Gastro-esophageal reflux disease without esophagitis: Secondary | ICD-10-CM | POA: Diagnosis not present

## 2019-02-07 DIAGNOSIS — D469 Myelodysplastic syndrome, unspecified: Secondary | ICD-10-CM | POA: Diagnosis not present

## 2019-02-07 DIAGNOSIS — J449 Chronic obstructive pulmonary disease, unspecified: Secondary | ICD-10-CM | POA: Diagnosis not present

## 2019-02-07 DIAGNOSIS — R5381 Other malaise: Secondary | ICD-10-CM | POA: Diagnosis not present

## 2019-02-07 DIAGNOSIS — Z9981 Dependence on supplemental oxygen: Secondary | ICD-10-CM | POA: Diagnosis not present

## 2019-02-07 DIAGNOSIS — I480 Paroxysmal atrial fibrillation: Secondary | ICD-10-CM | POA: Diagnosis not present

## 2019-02-07 DIAGNOSIS — N400 Enlarged prostate without lower urinary tract symptoms: Secondary | ICD-10-CM | POA: Diagnosis not present

## 2019-02-07 DIAGNOSIS — F418 Other specified anxiety disorders: Secondary | ICD-10-CM | POA: Diagnosis not present

## 2019-02-07 DIAGNOSIS — J9611 Chronic respiratory failure with hypoxia: Secondary | ICD-10-CM | POA: Diagnosis not present

## 2019-02-07 DIAGNOSIS — I5020 Unspecified systolic (congestive) heart failure: Secondary | ICD-10-CM | POA: Diagnosis not present

## 2019-02-07 DIAGNOSIS — Z6830 Body mass index (BMI) 30.0-30.9, adult: Secondary | ICD-10-CM | POA: Diagnosis not present

## 2019-02-07 DIAGNOSIS — D631 Anemia in chronic kidney disease: Secondary | ICD-10-CM | POA: Diagnosis not present

## 2019-02-07 DIAGNOSIS — I272 Pulmonary hypertension, unspecified: Secondary | ICD-10-CM | POA: Diagnosis not present

## 2019-02-07 DIAGNOSIS — L409 Psoriasis, unspecified: Secondary | ICD-10-CM | POA: Diagnosis not present

## 2019-02-07 DIAGNOSIS — G2581 Restless legs syndrome: Secondary | ICD-10-CM | POA: Diagnosis not present

## 2019-02-08 DIAGNOSIS — I272 Pulmonary hypertension, unspecified: Secondary | ICD-10-CM | POA: Diagnosis not present

## 2019-02-08 DIAGNOSIS — I480 Paroxysmal atrial fibrillation: Secondary | ICD-10-CM | POA: Diagnosis not present

## 2019-02-08 DIAGNOSIS — N183 Chronic kidney disease, stage 3 (moderate): Secondary | ICD-10-CM | POA: Diagnosis not present

## 2019-02-08 DIAGNOSIS — I5020 Unspecified systolic (congestive) heart failure: Secondary | ICD-10-CM | POA: Diagnosis not present

## 2019-02-08 DIAGNOSIS — J9611 Chronic respiratory failure with hypoxia: Secondary | ICD-10-CM | POA: Diagnosis not present

## 2019-02-08 DIAGNOSIS — J449 Chronic obstructive pulmonary disease, unspecified: Secondary | ICD-10-CM | POA: Diagnosis not present

## 2019-02-16 ENCOUNTER — Ambulatory Visit: Payer: Medicare Other | Admitting: Cardiovascular Disease

## 2019-03-10 NOTE — Telephone Encounter (Signed)
Spoke with the patient's family. The patient passed away today. The family has been given our condolences.

## 2019-03-10 DEATH — deceased

## 2019-10-24 IMAGING — CR DG CHEST 2V
2 series · 2 of 2 positions shown · non-contrast
Comparison: 09/26/2017 chest radiograph

CLINICAL DATA: 85 y/o M; congestive heart failure. Shortness of
breath. History of coronary artery disease, MI, cardiomyopathy, OSA,
AFib, pulmonary hypertension.

EXAM:
CHEST  2 VIEW

[chest pa]
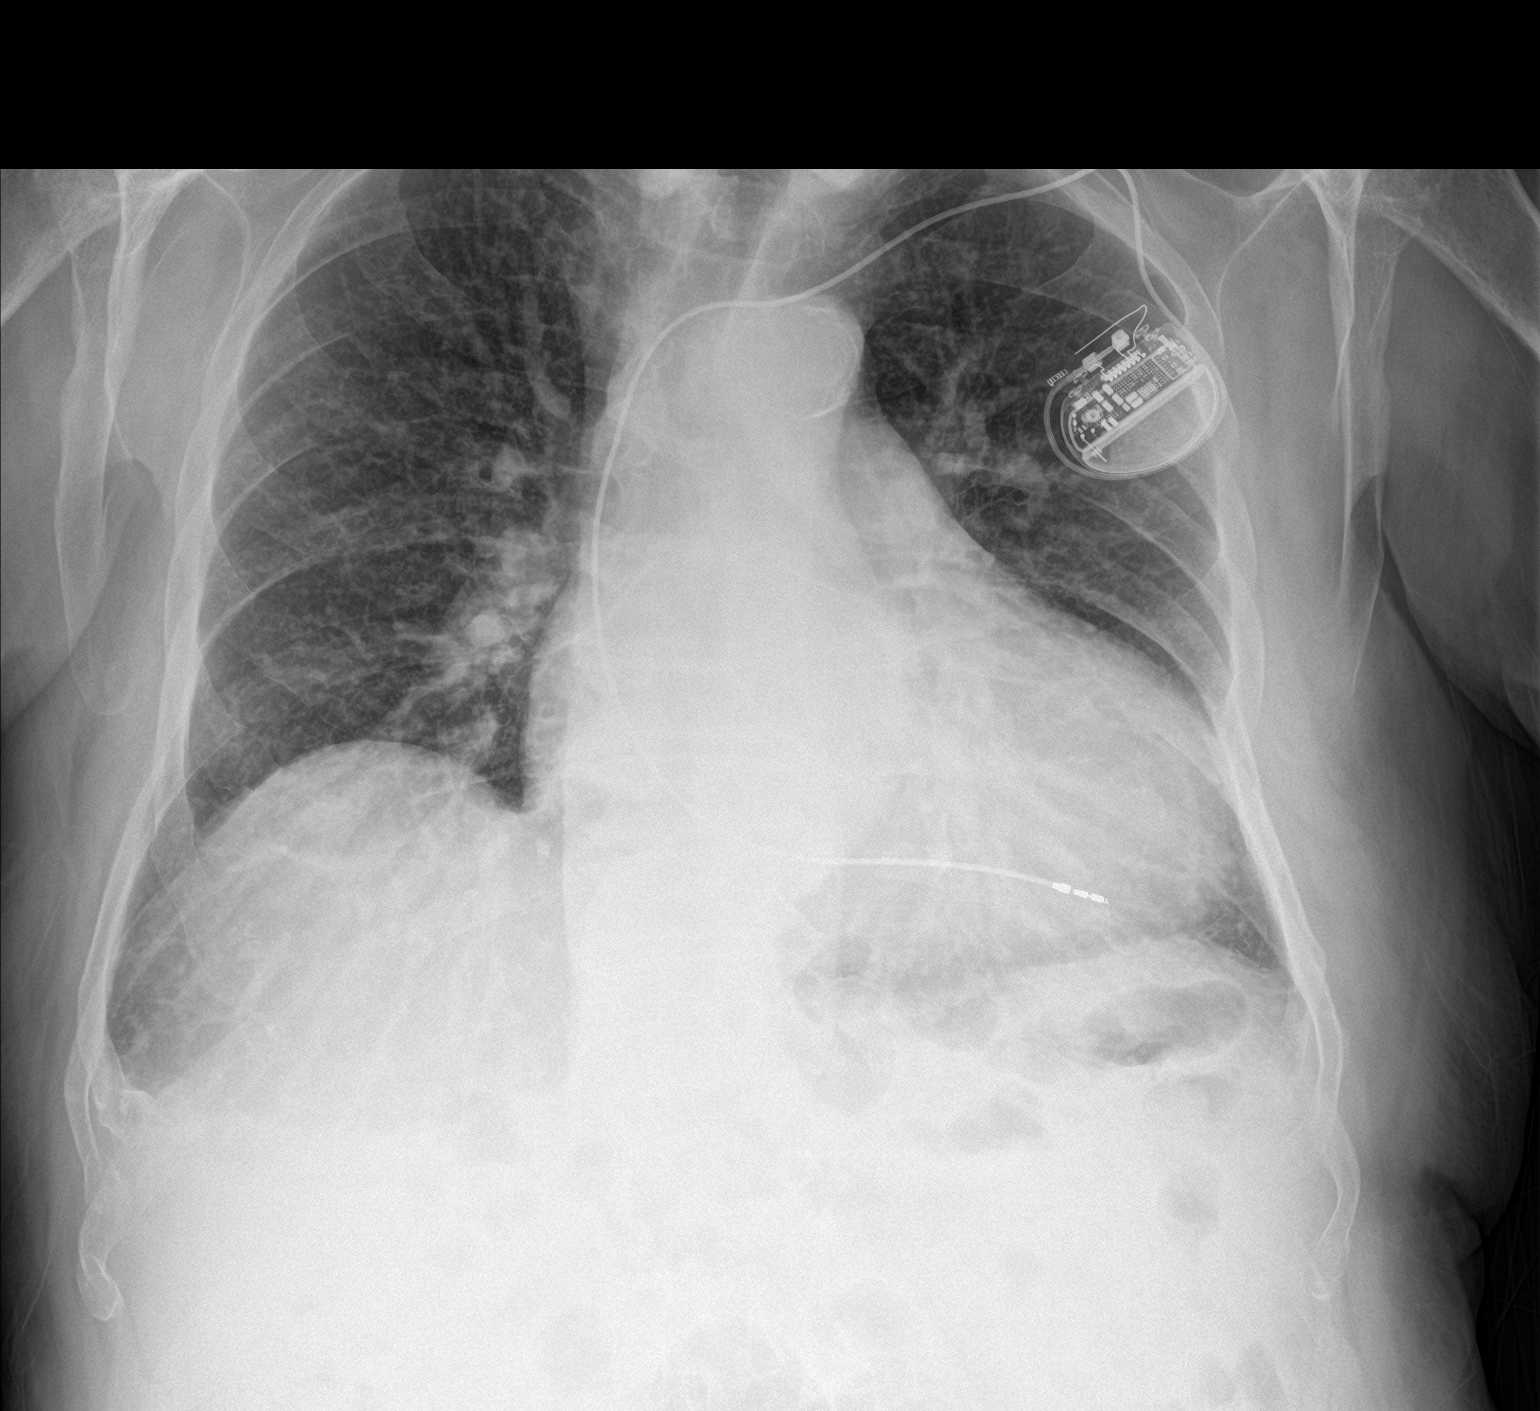

[chest lat]
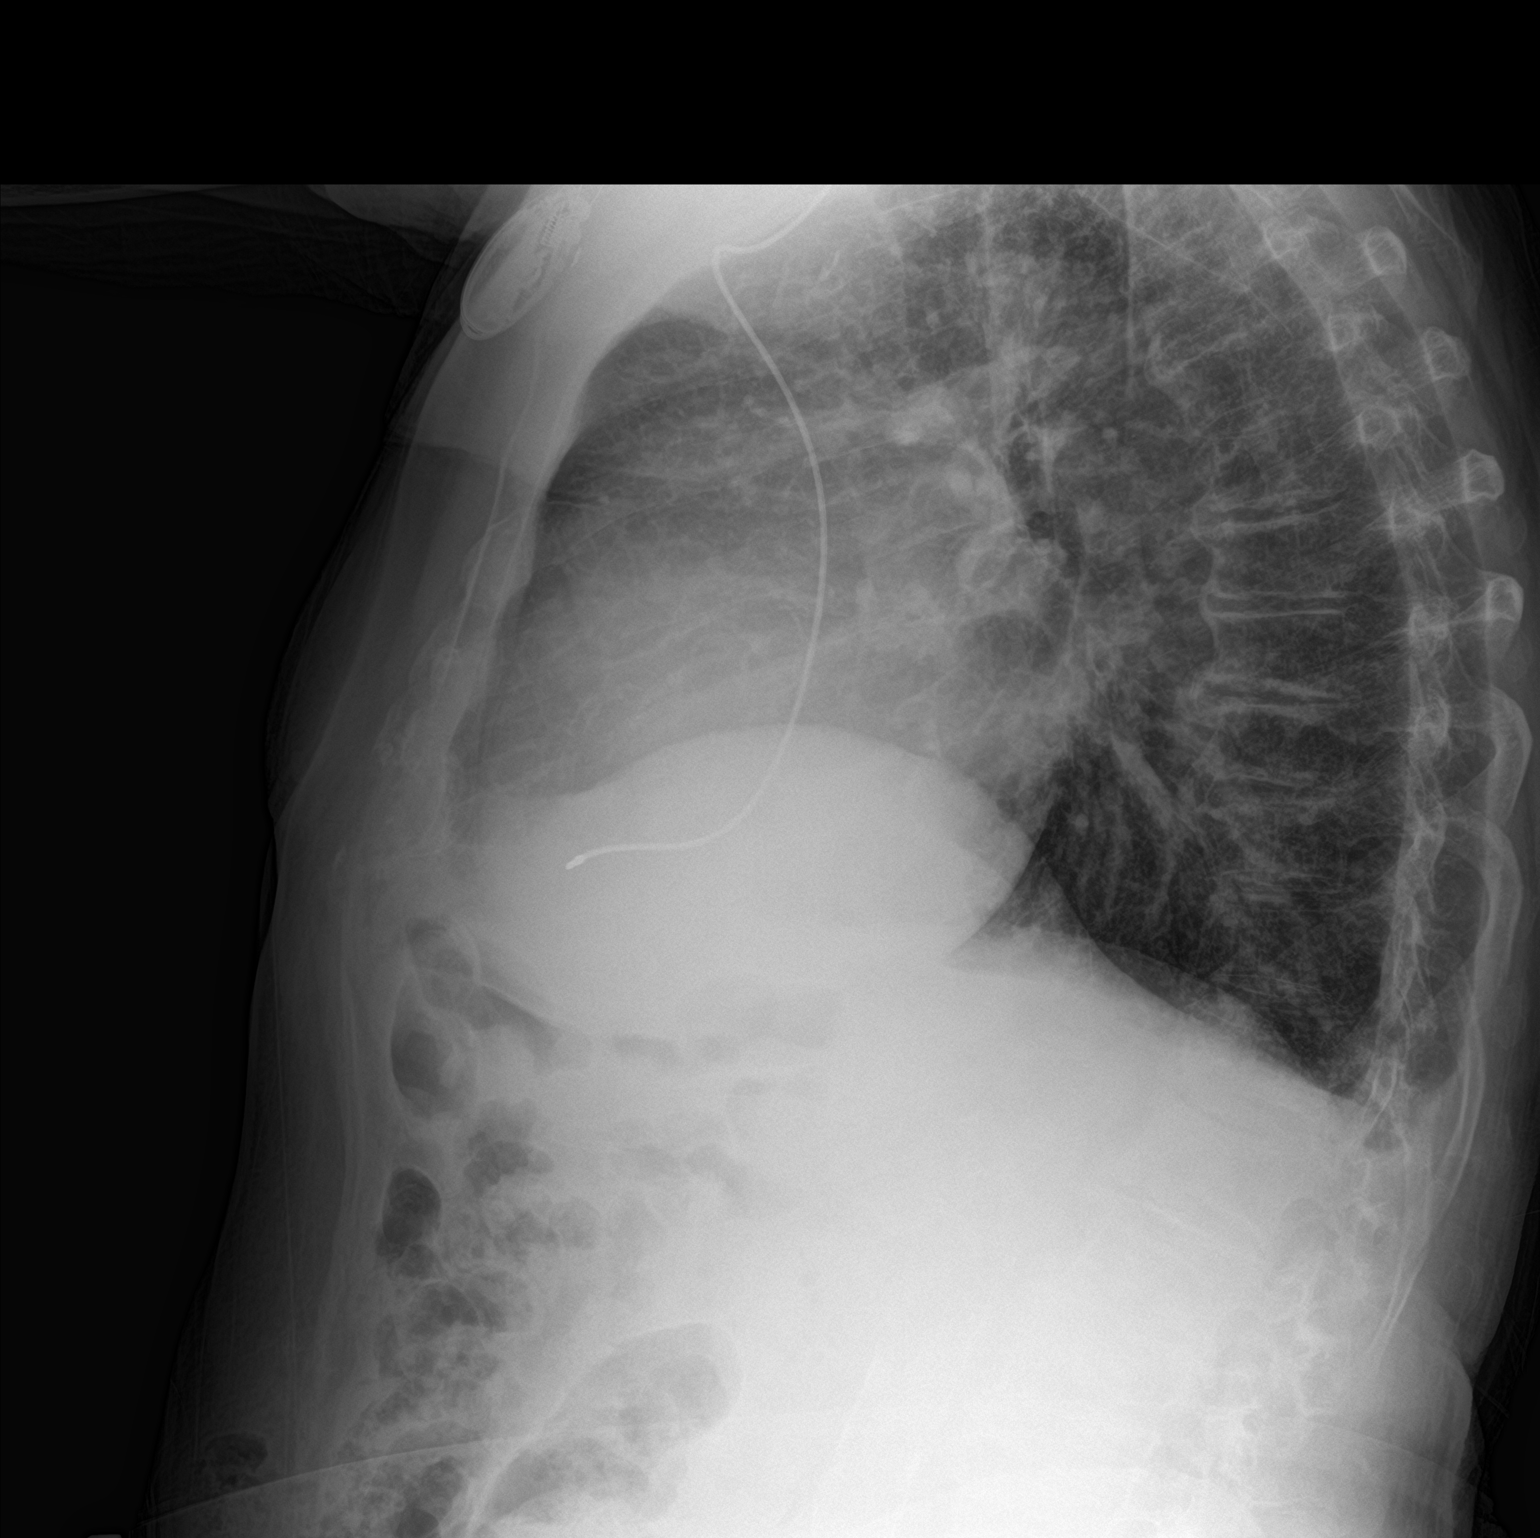

[2 of 2 positions shown; findings below may reference images not displayed]

FINDINGS: Stable cardiomegaly given projection and technique. Aortic
atherosclerosis with calcification. Single lead pacemaker. Mild
reticular opacities compatible with interstitial pulmonary edema. No
consolidation, effusion, or pneumothorax. Mild degenerative changes
of thoracic spine. Right rotator cuff repair anchors noted.
IMPRESSION: Stable cardiomegaly and mild interstitial pulmonary edema.

By: Fransla Meo M.D.

## 2019-11-27 IMAGING — CT CT CERVICAL SPINE W/O CM
5 of 8 series · 12 of 33 positions shown, 13 images · non-contrast
Comparison: 04/05/2017

CLINICAL DATA: Multiple falls at home, posttraumatic headache,
weakness, bruising on face in arms, history of coronary artery
disease post MI, non ischemic cardiomyopathy, CHF, stage III chronic
kidney disease, hypertension, permanent atrial fibrillation,
pulmonary hypertension

EXAM:
CT HEAD WITHOUT CONTRAST
CT CERVICAL SPINE WITHOUT CONTRAST
TECHNIQUE: Multidetector CT imaging of the head and cervical spine was
performed following the standard protocol without intravenous
contrast. Multiplanar CT image reconstructions of the cervical spine
were also generated.

[Series 6: head bone · axial · 0.46mm/px · z∈[-105,-43]mm · 2 of 93 slices shown]
[im 31/93  bone]
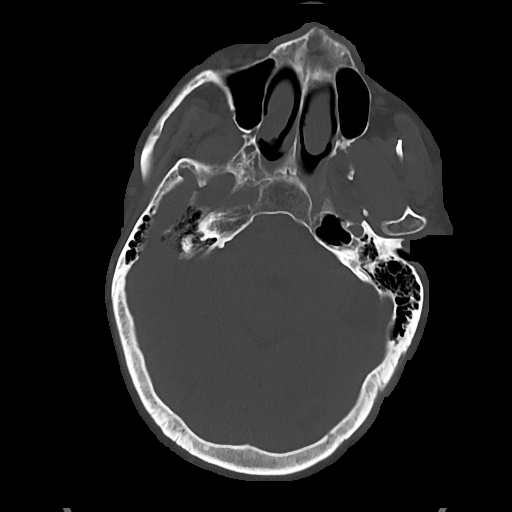
[im 62/93  bone]
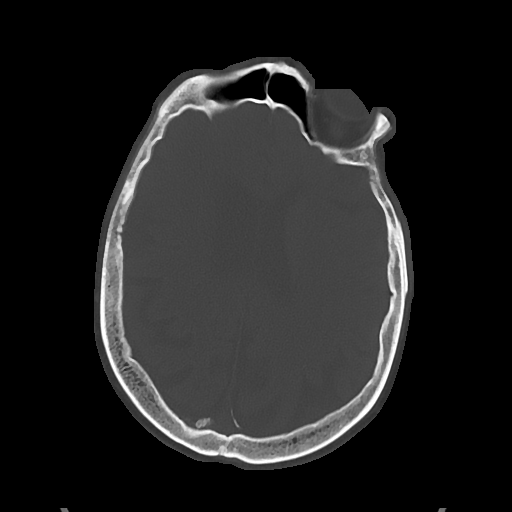

[Series 11: c spine soft · axial · 0.39mm/px · z∈[-234,-160]mm · 2 of 109 slices shown]
[im 37/109  soft-tissue]
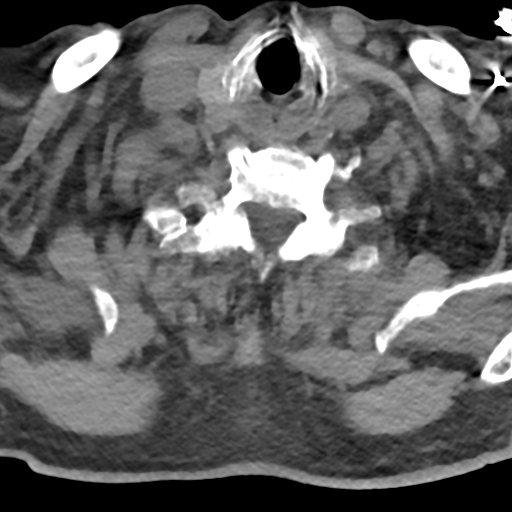
[im 73/109  soft-tissue]
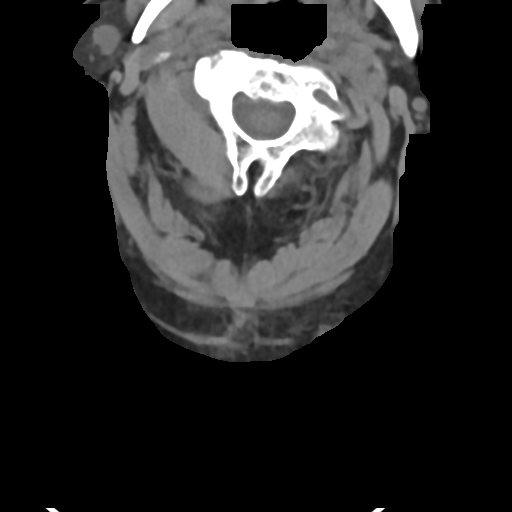

[Series 12: sag bone · sagittal · 0.26mm/px · 4 of 61 slices shown]
[im 13/61  bone]
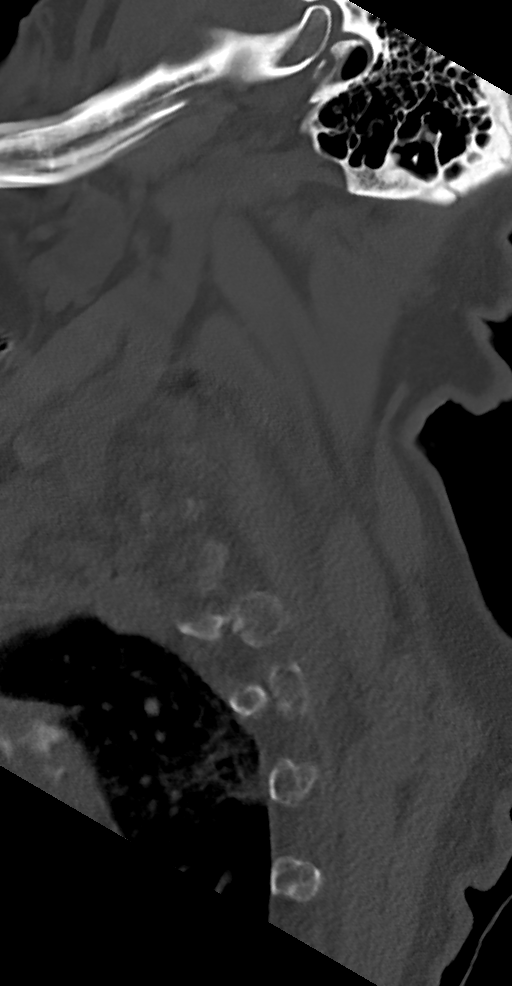
[im 25/61  bone]
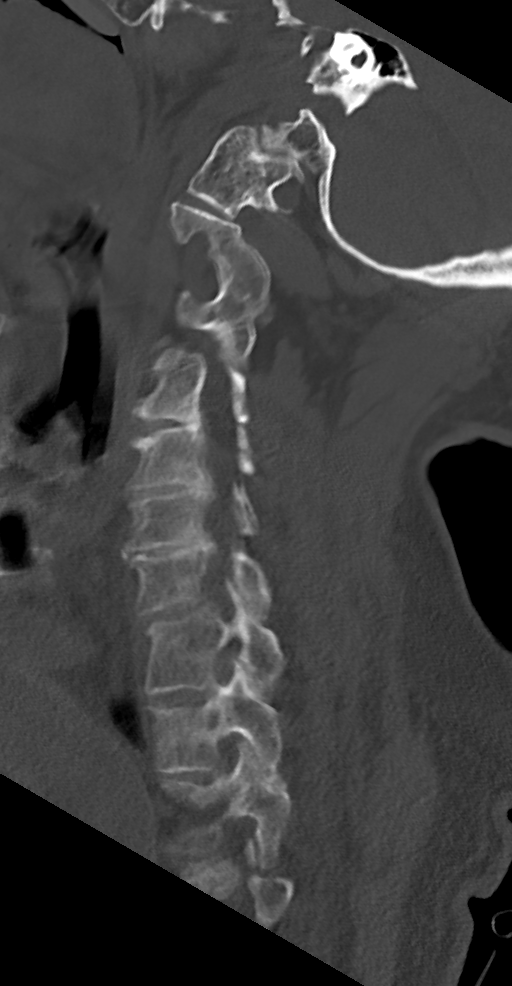
[im 37/61  bone]
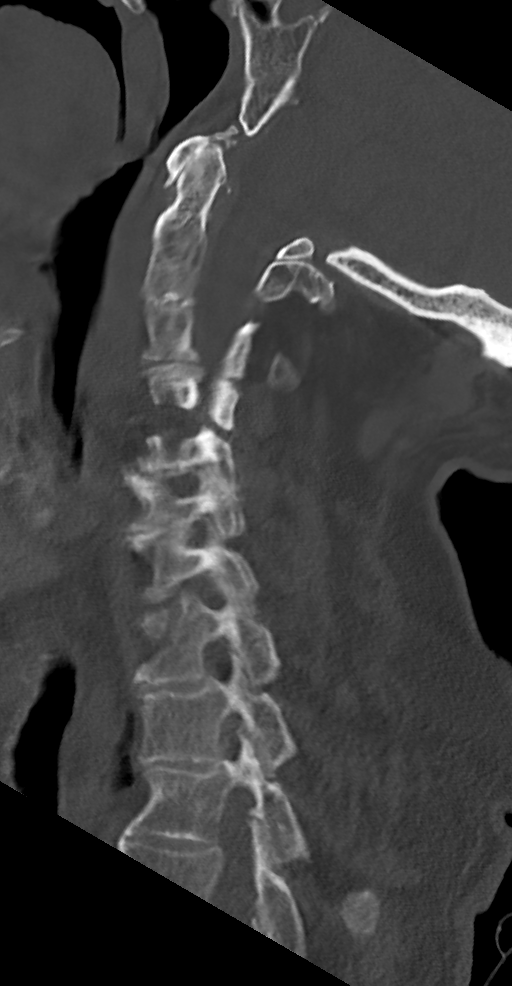
[im 49/61  bone]
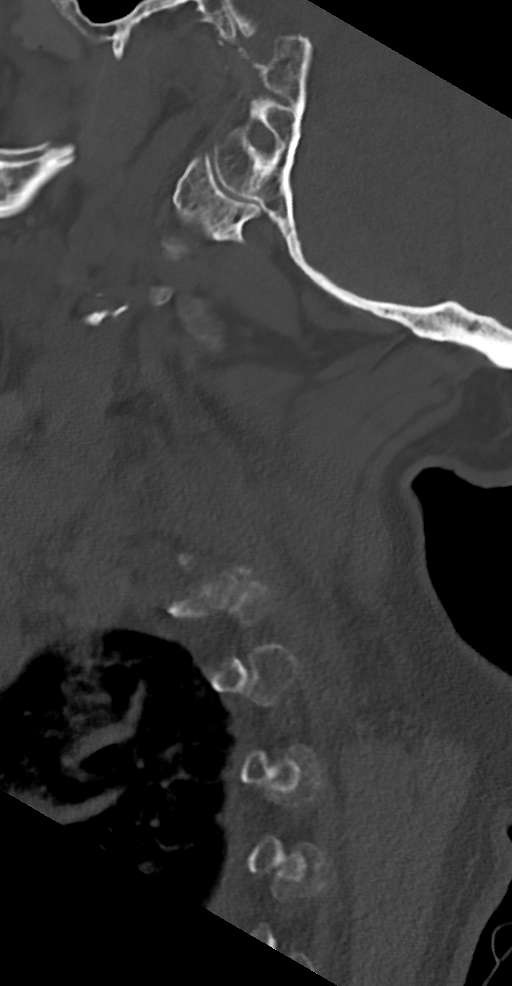

[Series 13: cor bone · coronal · 0.27mm/px · 2 of 61 slices shown]
[im 21/61  bone]
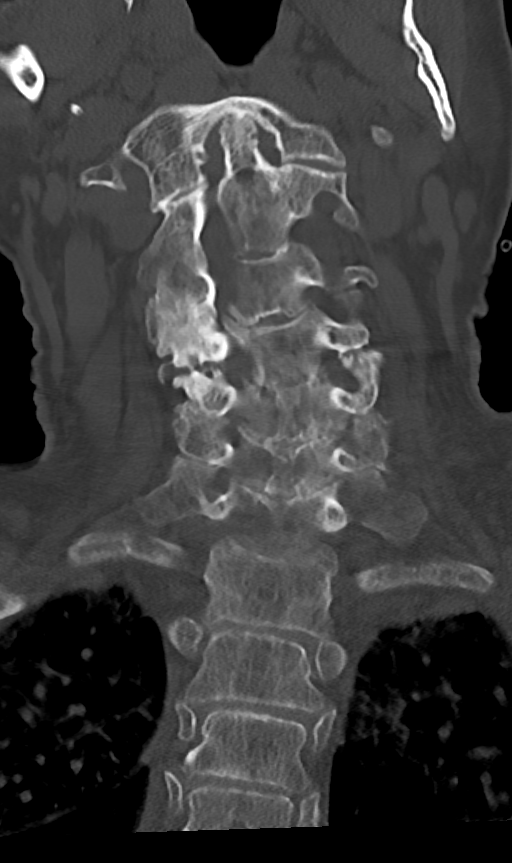
[im 41/61  bone]
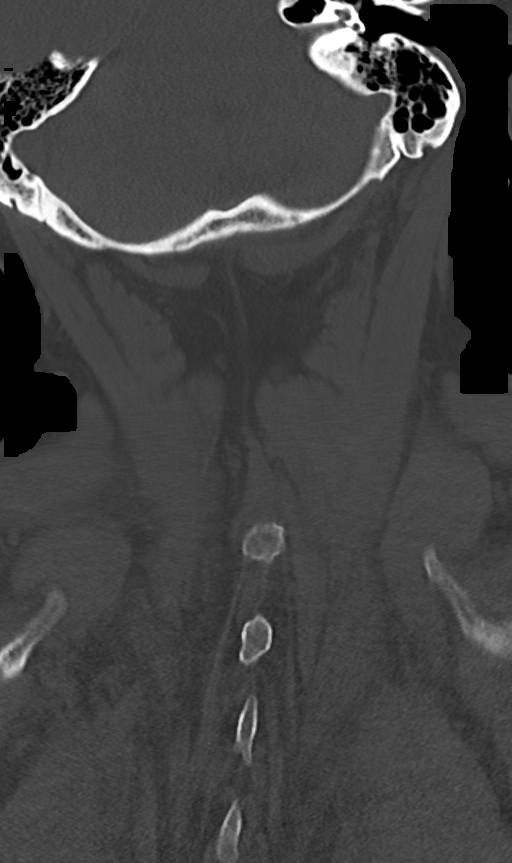

[Series 14: orthogonal axials · axial · 0.21mm/px · z∈[-254,-212]mm · 2 of 106 slices shown, 3 images]
[im 36/106  soft-tissue]
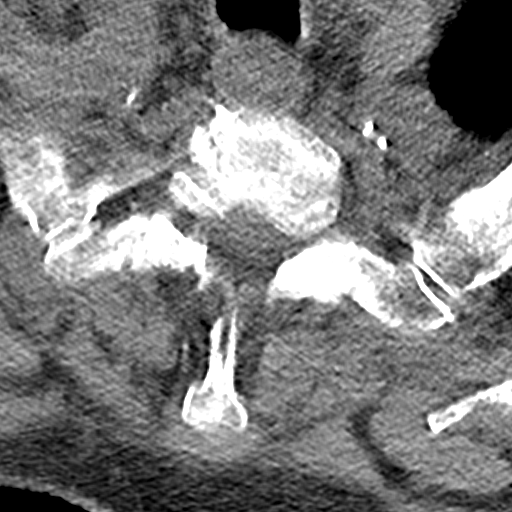
[im 36/106  bone]
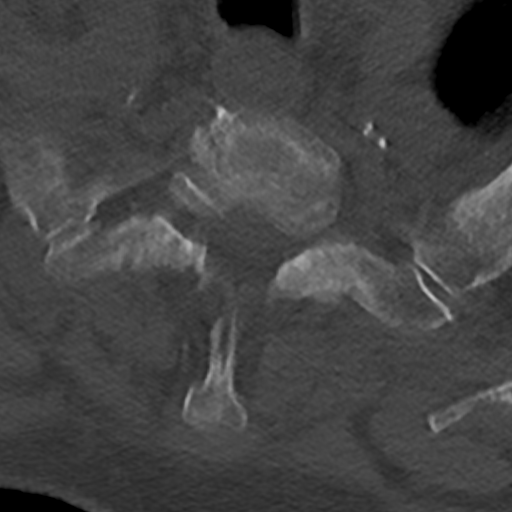
[im 71/106  bone]
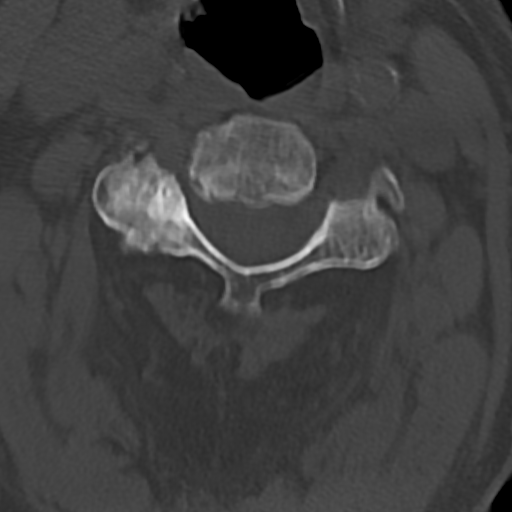

[12 of 33 positions shown; findings below may reference images not displayed]

FINDINGS: CT HEAD FINDINGS

Brain: Motion artifacts near vertex. Generalized atrophy. Normal
ventricular morphology. No midline shift or mass effect. Small
vessel chronic ischemic changes of deep cerebral white matter. No
intracranial hemorrhage, mass lesion or evidence acute infarction
identified within limitations of motion. Small old lacunar infarct
at posterior limb of LEFT internal capsule. No extra-axial fluid
collections.

Vascular: Atherosclerotic calcification of internal carotid arteries
at skull base

Skull: Grossly intact

Sinuses/Orbits: Clear

Other: N/A

CT CERVICAL SPINE FINDINGS

Alignment: Imaging degraded by patient motion. Alignment grossly
normal.

Skull base and vertebrae: Visualized skull base intact. Bones
demineralized. Vertebral body heights appear maintained. No definite
fracture, subluxation, or bone destruction. Multilevel facet
degenerative changes.

Soft tissues and spinal canal: Prevertebral soft tissues normal
thickness.

Disc levels: Mild AP narrowing of spinal canal due to endplate spur
formation at C5-C6. Disc space narrowing at C4-C5 through C6-C7.

Upper chest: Lung apices clear

Other: Atherosclerotic calcifications within the carotid systems.
IMPRESSION: Motion artifacts on both CT head and CT cervical spine exams.

Atrophy with small vessel chronic ischemic changes of deep cerebral
white matter.

Tiny old lacunar infarct at posterior limb LEFT internal capsule.

No definite acute intracranial abnormalities.

Multilevel degenerative disc and facet disease changes of the
cervical spine.

No definite acute cervical spine abnormalities.
